# Patient Record
Sex: Male | Born: 1940
Health system: Southern US, Community
[De-identification: ages and names within clinical notes are randomized; demographics above are authoritative.]

## PROBLEM LIST (undated history)

## (undated) DIAGNOSIS — E785 Hyperlipidemia, unspecified: Secondary | ICD-10-CM

## (undated) DIAGNOSIS — I2510 Atherosclerotic heart disease of native coronary artery without angina pectoris: Secondary | ICD-10-CM

## (undated) DIAGNOSIS — I219 Acute myocardial infarction, unspecified: Secondary | ICD-10-CM

## (undated) DIAGNOSIS — M67439 Ganglion, unspecified wrist: Secondary | ICD-10-CM

## (undated) DIAGNOSIS — M459 Ankylosing spondylitis of unspecified sites in spine: Secondary | ICD-10-CM

## (undated) DIAGNOSIS — M7989 Other specified soft tissue disorders: Secondary | ICD-10-CM

## (undated) DIAGNOSIS — M2420 Disorder of ligament, unspecified site: Secondary | ICD-10-CM

## (undated) DIAGNOSIS — Z8613 Personal history of malaria: Secondary | ICD-10-CM

## (undated) DIAGNOSIS — N200 Calculus of kidney: Secondary | ICD-10-CM

## (undated) DIAGNOSIS — M629 Disorder of muscle, unspecified: Secondary | ICD-10-CM

## (undated) DIAGNOSIS — R42 Dizziness and giddiness: Secondary | ICD-10-CM

## (undated) DIAGNOSIS — I10 Essential (primary) hypertension: Secondary | ICD-10-CM

## (undated) DIAGNOSIS — M75100 Unspecified rotator cuff tear or rupture of unspecified shoulder, not specified as traumatic: Secondary | ICD-10-CM

## (undated) DIAGNOSIS — T7840XA Allergy, unspecified, initial encounter: Secondary | ICD-10-CM

## (undated) DIAGNOSIS — F419 Anxiety disorder, unspecified: Secondary | ICD-10-CM

## (undated) DIAGNOSIS — K635 Polyp of colon: Secondary | ICD-10-CM

## (undated) DIAGNOSIS — M1990 Unspecified osteoarthritis, unspecified site: Secondary | ICD-10-CM

## (undated) DIAGNOSIS — R05 Cough: Secondary | ICD-10-CM

## (undated) DIAGNOSIS — R9389 Abnormal findings on diagnostic imaging of other specified body structures: Secondary | ICD-10-CM

## (undated) DIAGNOSIS — R059 Cough, unspecified: Secondary | ICD-10-CM

## (undated) HISTORY — DX: Anxiety disorder, unspecified: F41.9

## (undated) HISTORY — DX: Cough, unspecified: R05.9

## (undated) HISTORY — PX: CORONARY ANGIOPLASTY WITH STENT PLACEMENT: SHX49

## (undated) HISTORY — DX: Allergy, unspecified, initial encounter: T78.40XA

## (undated) HISTORY — DX: Essential (primary) hypertension: I10

## (undated) HISTORY — DX: Other specified soft tissue disorders: M79.89

## (undated) HISTORY — DX: Polyp of colon: K63.5

## (undated) HISTORY — PX: OTHER SURGICAL HISTORY: SHX169

## (undated) HISTORY — DX: Disorder of ligament, unspecified site: M24.20

## (undated) HISTORY — DX: Atherosclerotic heart disease of native coronary artery without angina pectoris: I25.10

## (undated) HISTORY — PX: PROSTATE BIOPSY: SHX241

## (undated) HISTORY — DX: Calculus of kidney: N20.0

## (undated) HISTORY — DX: Ankylosing spondylitis of unspecified sites in spine: M45.9

## (undated) HISTORY — DX: Unspecified osteoarthritis, unspecified site: M19.90

## (undated) HISTORY — DX: Personal history of malaria: Z86.13

## (undated) HISTORY — DX: Abnormal findings on diagnostic imaging of other specified body structures: R93.89

## (undated) HISTORY — DX: Acute myocardial infarction, unspecified: I21.9

## (undated) HISTORY — DX: Unspecified rotator cuff tear or rupture of unspecified shoulder, not specified as traumatic: M75.100

## (undated) HISTORY — DX: Ganglion, unspecified wrist: M67.439

## (undated) HISTORY — DX: Cough: R05

## (undated) HISTORY — PX: ROTATOR CUFF REPAIR: SHX139

## (undated) HISTORY — DX: Dizziness and giddiness: R42

## (undated) HISTORY — DX: Disorder of muscle, unspecified: M62.9

## (undated) HISTORY — DX: Hyperlipidemia, unspecified: E78.5

---

## 1998-03-08 ENCOUNTER — Ambulatory Visit (HOSPITAL_COMMUNITY): Admission: RE | Admit: 1998-03-08 | Discharge: 1998-03-08 | Payer: Self-pay | Admitting: Urology

## 1998-03-15 ENCOUNTER — Emergency Department (HOSPITAL_COMMUNITY): Admission: EM | Admit: 1998-03-15 | Discharge: 1998-03-15 | Payer: Self-pay | Admitting: Emergency Medicine

## 2000-12-12 ENCOUNTER — Encounter: Admission: RE | Admit: 2000-12-12 | Discharge: 2000-12-12 | Payer: Self-pay | Admitting: Orthopedic Surgery

## 2000-12-12 ENCOUNTER — Encounter: Payer: Self-pay | Admitting: Orthopedic Surgery

## 2001-06-26 ENCOUNTER — Encounter: Admission: RE | Admit: 2001-06-26 | Discharge: 2001-06-26 | Payer: Self-pay | Admitting: Internal Medicine

## 2001-06-26 ENCOUNTER — Encounter: Payer: Self-pay | Admitting: Internal Medicine

## 2001-06-29 ENCOUNTER — Ambulatory Visit (HOSPITAL_COMMUNITY): Admission: RE | Admit: 2001-06-29 | Discharge: 2001-06-29 | Payer: Self-pay | Admitting: Internal Medicine

## 2002-09-26 ENCOUNTER — Encounter: Payer: Self-pay | Admitting: Pediatrics

## 2002-09-27 ENCOUNTER — Observation Stay (HOSPITAL_COMMUNITY): Admission: EM | Admit: 2002-09-27 | Discharge: 2002-09-27 | Payer: Self-pay | Admitting: Emergency Medicine

## 2004-03-27 ENCOUNTER — Emergency Department (HOSPITAL_COMMUNITY): Admission: EM | Admit: 2004-03-27 | Discharge: 2004-03-27 | Payer: Self-pay | Admitting: Emergency Medicine

## 2006-08-12 DIAGNOSIS — I219 Acute myocardial infarction, unspecified: Secondary | ICD-10-CM

## 2006-08-12 HISTORY — DX: Acute myocardial infarction, unspecified: I21.9

## 2006-08-19 ENCOUNTER — Ambulatory Visit: Payer: Self-pay | Admitting: Cardiology

## 2006-08-19 ENCOUNTER — Inpatient Hospital Stay (HOSPITAL_COMMUNITY): Admission: AC | Admit: 2006-08-19 | Discharge: 2006-08-25 | Payer: Self-pay

## 2006-08-21 ENCOUNTER — Encounter: Payer: Self-pay | Admitting: Cardiology

## 2006-09-10 ENCOUNTER — Ambulatory Visit: Payer: Self-pay | Admitting: Cardiology

## 2006-09-11 ENCOUNTER — Encounter (HOSPITAL_COMMUNITY): Admission: RE | Admit: 2006-09-11 | Discharge: 2006-12-10 | Payer: Self-pay | Admitting: Cardiology

## 2006-09-12 ENCOUNTER — Ambulatory Visit: Admission: RE | Admit: 2006-09-12 | Discharge: 2006-09-12 | Payer: Self-pay | Admitting: Cardiology

## 2006-09-12 ENCOUNTER — Ambulatory Visit: Payer: Self-pay | Admitting: Cardiology

## 2006-09-12 LAB — CONVERTED CEMR LAB: Pro B Natriuretic peptide (BNP): 53 pg/mL

## 2006-10-14 ENCOUNTER — Ambulatory Visit: Payer: Self-pay | Admitting: Cardiology

## 2006-10-24 ENCOUNTER — Encounter: Payer: Self-pay | Admitting: Cardiology

## 2006-10-24 ENCOUNTER — Ambulatory Visit: Payer: Self-pay | Admitting: Cardiology

## 2006-10-24 ENCOUNTER — Ambulatory Visit: Payer: Self-pay

## 2006-10-24 LAB — CONVERTED CEMR LAB
ALT: 28 units/L (ref 0–40)
BUN: 14 mg/dL (ref 6–23)
Bilirubin, Direct: 0.1 mg/dL (ref 0.0–0.3)
CO2: 32 meq/L (ref 19–32)
Cholesterol: 73 mg/dL (ref 0–200)
Creatinine, Ser: 1.1 mg/dL (ref 0.4–1.5)
GFR calc Af Amer: 87 mL/min
GFR calc non Af Amer: 72 mL/min
Glucose, Bld: 89 mg/dL (ref 70–99)
HDL: 31 mg/dL — ABNORMAL LOW (ref >39.0)
LDL Cholesterol: 30 mg/dL (ref 0–99)
Sodium: 143 meq/L (ref 135–145)

## 2006-12-03 ENCOUNTER — Ambulatory Visit: Payer: Self-pay | Admitting: Cardiovascular Disease

## 2007-01-13 ENCOUNTER — Ambulatory Visit: Payer: Self-pay | Admitting: Cardiology

## 2007-01-29 ENCOUNTER — Ambulatory Visit: Payer: Self-pay | Admitting: Cardiovascular Disease

## 2007-02-09 ENCOUNTER — Ambulatory Visit: Payer: Self-pay | Admitting: Cardiology

## 2007-02-10 ENCOUNTER — Ambulatory Visit: Payer: Self-pay | Admitting: Cardiology

## 2007-02-10 ENCOUNTER — Ambulatory Visit: Payer: Self-pay | Admitting: Cardiovascular Disease

## 2007-02-10 ENCOUNTER — Ambulatory Visit (HOSPITAL_COMMUNITY): Admission: RE | Admit: 2007-02-10 | Discharge: 2007-02-10 | Payer: Self-pay | Admitting: Cardiovascular Disease

## 2007-02-10 LAB — CONVERTED CEMR LAB
Basophils Absolute: 0 10*3/uL (ref 0.0–0.1)
Basophils Relative: 0 % (ref 0.0–1.0)
Hemoglobin: 14.2 g/dL (ref 13.0–17.0)
Lymphocytes Relative: 30.7 % (ref 12.0–46.0)
MCV: 94.1 fL (ref 78.0–100.0)
Monocytes Absolute: 0.3 10*3/uL (ref 0.2–0.7)
Monocytes Relative: 6.3 % (ref 3.0–11.0)
Neutrophils Relative %: 61.7 % (ref 43.0–77.0)
Platelets: 125 10*3/uL — ABNORMAL LOW (ref 150–400)
WBC: 4.2 10*3/uL — ABNORMAL LOW (ref 4.5–10.5)

## 2007-02-16 ENCOUNTER — Ambulatory Visit: Payer: Self-pay

## 2007-02-16 LAB — CONVERTED CEMR LAB
Chloride: 110 meq/L (ref 96–112)
Cholesterol: 88 mg/dL (ref 0–200)
HDL: 30.4 mg/dL — ABNORMAL LOW (ref >39.0)
Potassium: 3.8 meq/L (ref 3.5–5.1)
Total CHOL/HDL Ratio: 2.9
Triglycerides: 59 mg/dL (ref 0–149)

## 2007-02-19 ENCOUNTER — Ambulatory Visit: Payer: Self-pay | Admitting: Pulmonary Disease

## 2007-03-11 ENCOUNTER — Ambulatory Visit: Payer: Self-pay | Admitting: Cardiology

## 2007-03-11 ENCOUNTER — Ambulatory Visit: Payer: Self-pay | Admitting: Cardiovascular Disease

## 2007-04-09 ENCOUNTER — Ambulatory Visit: Payer: Self-pay | Admitting: Cardiology

## 2007-05-12 ENCOUNTER — Ambulatory Visit: Payer: Self-pay | Admitting: Cardiology

## 2007-05-12 LAB — CONVERTED CEMR LAB
Alkaline Phosphatase: 51 units/L (ref 39–117)
Cholesterol: 101 mg/dL (ref 0–200)
HDL: 31.8 mg/dL — ABNORMAL LOW (ref >39.0)
LDL Cholesterol: 49 mg/dL (ref 0–99)
Total Bilirubin: 1.1 mg/dL (ref 0.3–1.2)
Total Protein: 6 g/dL (ref 6.0–8.3)

## 2007-05-14 ENCOUNTER — Ambulatory Visit: Payer: Self-pay | Admitting: Cardiology

## 2007-08-27 ENCOUNTER — Ambulatory Visit: Payer: Self-pay | Admitting: Cardiology

## 2007-08-27 LAB — CONVERTED CEMR LAB
ALT: 25 units/L (ref 0–53)
AST: 18 units/L (ref 0–37)
Albumin: 3.9 g/dL (ref 3.5–5.2)
Alkaline Phosphatase: 55 units/L (ref 39–117)
Cholesterol: 93 mg/dL (ref 0–200)
LDL Cholesterol: 54 mg/dL (ref 0–99)
VLDL: 10 mg/dL (ref 0–40)

## 2007-09-03 ENCOUNTER — Ambulatory Visit: Payer: Self-pay | Admitting: Cardiology

## 2007-09-15 ENCOUNTER — Ambulatory Visit: Payer: Self-pay | Admitting: Cardiology

## 2007-11-03 ENCOUNTER — Encounter: Payer: Self-pay | Admitting: Pulmonary Disease

## 2007-11-24 ENCOUNTER — Ambulatory Visit: Payer: Self-pay | Admitting: Cardiology

## 2007-11-24 LAB — CONVERTED CEMR LAB
ALT: 35 units/L (ref 0–53)
AST: 26 units/L (ref 0–37)
Albumin: 4 g/dL (ref 3.5–5.2)
Alkaline Phosphatase: 61 units/L (ref 39–117)
Cholesterol: 100 mg/dL (ref 0–200)
HDL: 29.1 mg/dL — ABNORMAL LOW (ref >39.0)
LDL Cholesterol: 44 mg/dL (ref 0–99)
Total Bilirubin: 0.8 mg/dL (ref 0.3–1.2)
Total Protein: 6.8 g/dL (ref 6.0–8.3)
VLDL: 27 mg/dL (ref 0–40)

## 2007-11-26 ENCOUNTER — Ambulatory Visit: Payer: Self-pay | Admitting: Cardiology

## 2008-02-16 ENCOUNTER — Ambulatory Visit: Payer: Self-pay | Admitting: Cardiology

## 2008-02-16 LAB — CONVERTED CEMR LAB
AST: 21 units/L (ref 0–37)
Albumin: 4 g/dL (ref 3.5–5.2)
Cholesterol: 96 mg/dL (ref 0–200)
HDL: 32.6 mg/dL — ABNORMAL LOW (ref >39.0)
LDL Cholesterol: 45 mg/dL (ref 0–99)
Total Protein: 6.8 g/dL (ref 6.0–8.3)
Triglycerides: 91 mg/dL (ref 0–149)

## 2008-02-18 ENCOUNTER — Ambulatory Visit: Payer: Self-pay | Admitting: Cardiology

## 2008-03-15 ENCOUNTER — Ambulatory Visit: Payer: Self-pay | Admitting: Cardiology

## 2008-03-23 ENCOUNTER — Ambulatory Visit: Payer: Self-pay

## 2008-03-23 ENCOUNTER — Encounter: Payer: Self-pay | Admitting: Pulmonary Disease

## 2008-03-23 ENCOUNTER — Ambulatory Visit: Payer: Self-pay | Admitting: Cardiology

## 2008-03-23 LAB — CONVERTED CEMR LAB
Basophils Absolute: 0 10*3/uL (ref 0.0–0.1)
Creatinine, Ser: 1.1 mg/dL (ref 0.4–1.5)
GFR calc non Af Amer: 72 mL/min
Hemoglobin: 13.9 g/dL (ref 13.0–17.0)
MCV: 96.3 fL (ref 78.0–100.0)
Neutrophils Relative %: 62 % (ref 43.0–77.0)
Potassium: 4.1 meq/L (ref 3.5–5.1)
RBC: 4.1 M/uL — ABNORMAL LOW (ref 4.22–5.81)
Sodium: 139 meq/L (ref 135–145)

## 2008-06-01 ENCOUNTER — Ambulatory Visit: Payer: Self-pay | Admitting: Cardiology

## 2008-06-01 LAB — CONVERTED CEMR LAB
ALT: 26 units/L (ref 0–53)
AST: 21 units/L (ref 0–37)
Cholesterol: 97 mg/dL (ref 0–200)
HDL: 33.7 mg/dL — ABNORMAL LOW (ref >39.0)
Total Bilirubin: 0.9 mg/dL (ref 0.3–1.2)
Total CHOL/HDL Ratio: 2.9

## 2008-06-15 ENCOUNTER — Encounter: Payer: Self-pay | Admitting: Pulmonary Disease

## 2008-07-18 ENCOUNTER — Ambulatory Visit: Payer: Self-pay | Admitting: Cardiology

## 2008-07-18 LAB — CONVERTED CEMR LAB
ALT: 22 units/L (ref 0–53)
AST: 19 units/L (ref 0–37)
Albumin: 3.9 g/dL (ref 3.5–5.2)
Alkaline Phosphatase: 51 units/L (ref 39–117)
Cholesterol: 100 mg/dL (ref 0–200)
LDL Cholesterol: 52 mg/dL (ref 0–99)
Total CHOL/HDL Ratio: 2.6
Total Protein: 6.6 g/dL (ref 6.0–8.3)
VLDL: 9 mg/dL (ref 0–40)

## 2008-07-21 ENCOUNTER — Ambulatory Visit: Payer: Self-pay | Admitting: Cardiology

## 2009-01-02 ENCOUNTER — Ambulatory Visit: Payer: Self-pay | Admitting: Cardiology

## 2009-01-03 LAB — CONVERTED CEMR LAB
Albumin: 4 g/dL (ref 3.5–5.2)
Bilirubin, Direct: 0.1 mg/dL (ref 0.0–0.3)
Total CHOL/HDL Ratio: 3
Total Protein: 6.5 g/dL (ref 6.0–8.3)
Triglycerides: 63 mg/dL (ref 0.0–149.0)
VLDL: 12.6 mg/dL (ref 0.0–40.0)

## 2009-01-05 ENCOUNTER — Ambulatory Visit: Payer: Self-pay | Admitting: Internal Medicine

## 2009-01-21 LAB — HM COLONOSCOPY

## 2009-04-05 DIAGNOSIS — I2510 Atherosclerotic heart disease of native coronary artery without angina pectoris: Secondary | ICD-10-CM

## 2009-04-06 ENCOUNTER — Ambulatory Visit: Payer: Self-pay | Admitting: Cardiology

## 2009-05-12 HISTORY — PX: HEMORRHOID SURGERY: SHX153

## 2009-05-29 ENCOUNTER — Telehealth (INDEPENDENT_AMBULATORY_CARE_PROVIDER_SITE_OTHER): Payer: Self-pay | Admitting: *Deleted

## 2009-06-05 ENCOUNTER — Ambulatory Visit: Payer: Self-pay | Admitting: Cardiology

## 2009-06-06 LAB — CONVERTED CEMR LAB
ALT: 26 units/L (ref 0–53)
AST: 22 units/L (ref 0–37)
LDL Cholesterol: 49 mg/dL (ref 0–99)
Total CHOL/HDL Ratio: 3
Total Protein: 6.5 g/dL (ref 6.0–8.3)
Triglycerides: 44 mg/dL (ref 0.0–149.0)
VLDL: 8.8 mg/dL (ref 0.0–40.0)

## 2009-06-08 ENCOUNTER — Ambulatory Visit: Payer: Self-pay | Admitting: Cardiovascular Disease

## 2009-06-15 ENCOUNTER — Encounter: Payer: Self-pay | Admitting: Cardiovascular Disease

## 2009-06-16 ENCOUNTER — Telehealth (INDEPENDENT_AMBULATORY_CARE_PROVIDER_SITE_OTHER): Payer: Self-pay | Admitting: *Deleted

## 2009-10-04 ENCOUNTER — Ambulatory Visit: Payer: Self-pay | Admitting: Internal Medicine

## 2009-10-04 ENCOUNTER — Inpatient Hospital Stay (HOSPITAL_COMMUNITY): Admission: EM | Admit: 2009-10-04 | Discharge: 2009-10-05 | Payer: Self-pay | Admitting: Emergency Medicine

## 2009-10-18 ENCOUNTER — Encounter: Admission: RE | Admit: 2009-10-18 | Discharge: 2010-01-09 | Payer: Self-pay | Admitting: Family Medicine

## 2009-10-31 ENCOUNTER — Encounter: Payer: Self-pay | Admitting: Internal Medicine

## 2009-11-03 ENCOUNTER — Ambulatory Visit: Payer: Self-pay | Admitting: Internal Medicine

## 2009-11-03 ENCOUNTER — Telehealth (INDEPENDENT_AMBULATORY_CARE_PROVIDER_SITE_OTHER): Payer: Self-pay | Admitting: *Deleted

## 2009-11-03 DIAGNOSIS — R05 Cough: Secondary | ICD-10-CM

## 2009-11-21 ENCOUNTER — Ambulatory Visit: Payer: Self-pay | Admitting: Cardiology

## 2009-11-23 ENCOUNTER — Ambulatory Visit: Payer: Self-pay | Admitting: Cardiology

## 2009-11-24 LAB — CONVERTED CEMR LAB
AST: 24 units/L (ref 0–37)
LDL Cholesterol: 25 mg/dL (ref 0–99)
VLDL: 8.8 mg/dL (ref 0.0–40.0)

## 2009-11-30 ENCOUNTER — Telehealth (INDEPENDENT_AMBULATORY_CARE_PROVIDER_SITE_OTHER): Payer: Self-pay | Admitting: *Deleted

## 2009-12-05 ENCOUNTER — Ambulatory Visit: Payer: Self-pay | Admitting: Pulmonary Disease

## 2009-12-05 DIAGNOSIS — M6740 Ganglion, unspecified site: Secondary | ICD-10-CM

## 2009-12-05 DIAGNOSIS — R930 Abnormal findings on diagnostic imaging of skull and head, not elsewhere classified: Secondary | ICD-10-CM

## 2009-12-05 DIAGNOSIS — B54 Unspecified malaria: Secondary | ICD-10-CM | POA: Insufficient documentation

## 2009-12-05 DIAGNOSIS — E7800 Pure hypercholesterolemia, unspecified: Secondary | ICD-10-CM | POA: Insufficient documentation

## 2009-12-05 DIAGNOSIS — M459 Ankylosing spondylitis of unspecified sites in spine: Secondary | ICD-10-CM | POA: Insufficient documentation

## 2009-12-05 DIAGNOSIS — M629 Disorder of muscle, unspecified: Secondary | ICD-10-CM

## 2009-12-05 DIAGNOSIS — K649 Unspecified hemorrhoids: Secondary | ICD-10-CM | POA: Insufficient documentation

## 2009-12-05 DIAGNOSIS — M75120 Complete rotator cuff tear or rupture of unspecified shoulder, not specified as traumatic: Secondary | ICD-10-CM

## 2009-12-05 DIAGNOSIS — D126 Benign neoplasm of colon, unspecified: Secondary | ICD-10-CM

## 2009-12-05 DIAGNOSIS — F411 Generalized anxiety disorder: Secondary | ICD-10-CM | POA: Insufficient documentation

## 2009-12-05 DIAGNOSIS — J309 Allergic rhinitis, unspecified: Secondary | ICD-10-CM

## 2010-01-19 ENCOUNTER — Telehealth: Payer: Self-pay | Admitting: Cardiology

## 2010-03-27 ENCOUNTER — Ambulatory Visit: Payer: Self-pay | Admitting: Cardiology

## 2010-04-20 ENCOUNTER — Encounter: Payer: Self-pay | Admitting: Pulmonary Disease

## 2010-05-07 ENCOUNTER — Ambulatory Visit: Payer: Self-pay | Admitting: Cardiology

## 2010-05-08 LAB — CONVERTED CEMR LAB
ALT: 22 units/L (ref 0–53)
Alkaline Phosphatase: 56 units/L (ref 39–117)
Cholesterol: 103 mg/dL (ref 0–200)
HDL: 35.4 mg/dL — ABNORMAL LOW (ref >39.00)
LDL Cholesterol: 56 mg/dL (ref 0–99)
Triglycerides: 60 mg/dL (ref 0.0–149.0)

## 2010-05-10 ENCOUNTER — Ambulatory Visit: Payer: Self-pay | Admitting: Cardiology

## 2010-06-04 ENCOUNTER — Ambulatory Visit: Payer: Self-pay | Admitting: Pulmonary Disease

## 2010-06-06 LAB — CONVERTED CEMR LAB
Basophils Absolute: 0 10*3/uL (ref 0.0–0.1)
Calcium: 9.3 mg/dL (ref 8.4–10.5)
Chloride: 105 meq/L (ref 96–112)
Glucose, Bld: 125 mg/dL — ABNORMAL HIGH (ref 70–99)
HCT: 37.6 % — ABNORMAL LOW (ref 39.0–52.0)
Hemoglobin: 13.2 g/dL (ref 13.0–17.0)
Lymphocytes Relative: 38.4 % (ref 12.0–46.0)
Lymphs Abs: 1.3 10*3/uL (ref 0.7–4.0)
MCV: 95.2 fL (ref 78.0–100.0)
PSA: 2.64 ng/mL (ref 0.10–4.00)
RBC: 3.95 M/uL — ABNORMAL LOW (ref 4.22–5.81)
Sodium: 139 meq/L (ref 135–145)
WBC: 3.3 10*3/uL — ABNORMAL LOW (ref 4.5–10.5)

## 2010-07-19 ENCOUNTER — Telehealth: Payer: Self-pay | Admitting: Pulmonary Disease

## 2010-08-22 ENCOUNTER — Telehealth (INDEPENDENT_AMBULATORY_CARE_PROVIDER_SITE_OTHER): Payer: Self-pay | Admitting: *Deleted

## 2010-09-02 ENCOUNTER — Encounter: Payer: Self-pay | Admitting: Cardiovascular Disease

## 2010-09-11 NOTE — Assessment & Plan Note (Signed)
Summary: rov lipid - [facility]  Medications Added NIASPAN 500 MG CR-TABS (NIACIN (ANTIHYPERLIPIDEMIC)) 1 TAB once daily        Lipid Management History:      Positive NCEP/ATP III risk factors include male age 68 years old or older, HDL cholesterol less than 40, and ASHD (either angina/prior MI/prior CABG).  Negative NCEP/ATP III risk factors include non-tobacco-user status.    The patient comes in today for dyslipidemia follow-up.  The patient has no complaints of medication problems, chest pain, palpitations, shortness of breath, peripheral edema, muscle aches, and muscle cramps.  Dietary compliance review reveals eating 5 or more fruits and vegetables, not counting carbohydrates but is limiting them, and limiting fats and TFA's.  He is avoiding red meats as much as possible, and eats a lot of chicken and salmon.  Review of exercise habits reveals that the patient is occasionally walking at the [facility] for 30 minutes, but has not been able to get there in the past week because he is very busy.  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  Compliance with medication is good.    Lipid Management Provider  Leota Sauers, PharmD, BCPS, CPP  Current Medications (verified): 1)  Aspirin 81 Mg Tbec (Aspirin) .... Take One Tablet By Mouth Daily 2)  Carvedilol 3.125 Mg Tabs (Carvedilol) .... Take One Tablet By Mouth Twice A Day 3)  Simvastatin 40 Mg Tabs (Simvastatin) .... Take 1/2  Tablet By Mouth Daily At Bedtime 4)  Niaspan 500 Mg Cr-Tabs (Niacin (Antihyperlipidemic)) .[name].. 1 Tab Once Daily  Allergies: No Known Drug Allergies  Social History: never smoker.  wife smokes but has just recently quit.   Vital Signs:  Patient profile:   70 year old male Weight:      184 pounds Pulse rate:   44 / minute BP sitting:   134 / 82  (right arm) Cuff size:   regular  Impression & Recommendations:  Problem # 1:  HYPERCHOLESTEROLEMIA (ICD-272.0) Assessment Unchanged Long-standing  history of hypercholesterolemia currently well controlled below LDL goal of 130 and TC < 200.  Since last visit, his cholesterol has elevated slightly due to reducing simvastatin dose to 20 mg daily, but all numbers are still at or better than current goals.  Continue to eat healthy foods including vegetables and control portion sizes.  Work to improve exercising regularly by going back to the [facility] to walk for 30 minutes three times a week.  Reduce Niaspan dose to 500 mg once daily.  If HDL decreases significantly, will increase back to twice daily.  Return in six months for labs and office visit.  Seen with: Kennieth Francois, PharmD Candidate  His updated medication list for this problem includes:    Simvastatin 40 Mg Tabs (Simvastatin) .[name]... Take 1/2  tablet by mouth daily at bedtime    Niaspan 500 Mg Cr-tabs (Niacin (antihyperlipidemic)) .[name]... 1 tab once daily  Problem # 2:  CAD, NATIVE VESSEL (ICD-414.01)  Lipid Assessment/Plan:      Based on NCEP/ATP III, the patient's risk factor category is "history of coronary disease, peripheral vascular disease, cerebrovascular disease, or aortic aneurysm".  The patient's lipid goals are as follows: Total cholesterol goal is 200; LDL cholesterol goal is 130; HDL cholesterol goal is 40; Triglyceride goal is 150.     Patient Instructions: 1)  It was nice to meet you! 2)  Continue taking all of your medications every day: 3)  Simvastatin 40 mg (one-half tablet daily) 4)  Niaspan 500 mg ONCE daily 5)  Aspirin 81 mg daily 6)  Carvedilol 3.125 twice daily 7)  You are doing great with your diet and eating healthy.  Continue to eat lots of vegetables. 8)  You are doing great staying active throughout the week.  Try to get back into walking at the [facility].   9)  Return for another visit in 6 months.

## 2010-09-11 NOTE — Assessment & Plan Note (Signed)
Summary: lipid rov/eac   Primary Provider:  dr Kriste Basque  CC:  dyslipidemia follow-up.  History of Present Illness:  Lipid Clinic Visit      The patient comes in today for dyslipidemia follow-up.  The patient has no complaints of medication problems, chest pain, palpitations, shortness of breath, peripheral edema, muscle aches, and muscle cramps.  Dietary compliance review reveals an overall grade of eating 5 or more fruits and vegetables, not counting carbohydratesbut is limiting them, and limiting fats and TFA's.  Review of exercise habits reveals that the patient is walking  twice a week.  Compliance with medication is good.    Preventive Screening-Counseling & Management  Alcohol-Tobacco     Alcohol drinks/day: <1     Smoking Status: never  Caffeine-Diet-Exercise     Caffeine use/day: 2-3 cups /day     Does Patient Exercise: yes  Current Medications (verified): 1)  Simvastatin 40 Mg Tabs (Simvastatin) .... Take One Tablet By Mouth Daily At Bedtime 2)  Niaspan 500 Mg Cr-Tabs (Niacin (Antihyperlipidemic)) .[name].. 1 Tab Bid 3)  Carvedilol 3.125 Mg Tabs (Carvedilol) .... Take One Tablet By Mouth Twice A Day 4)  Aspirin 81 Mg Tbec (Aspirin) .... Take One Tablet By Mouth Daily 5)  Benicar 20 Mg  Tabs (Olmesartan Medoxomil) .... One Half Daily 6)  Tramadol Hcl 50 Mg  Tabs (Tramadol Hcl) .... One To Two By Mouth Every 4-6 Hours As Needed  Allergies (verified): No Known Drug Allergies  Social History: Alcohol drinks/day:  <1 Caffeine use/day:  2-3 cups /day Does Patient Exercise:  yes   Vital Signs:  Patient profile:   70 year old male Height:      68 inches Weight:      183 pounds Pulse rate:   60 / minute Pulse rhythm:   regular BP sitting:   110 / 68  (right arm)  Impression & Recommendations:  Problem # 1:  PURE HYPERCHOLESTEROLEMIA (ICD-272.0)  His updated medication list for this problem includes:    Simvastatin 40 Mg Tabs (Simvastatin) .[name]... Take one tablet by mouth  daily at bedtime    Niaspan 500 Mg Cr-tabs (Niacin (antihyperlipidemic)) .[name]... 1 tab bid  Mr Gorniak returns to lipid clinic with no complaints.  He has been more strict on his diet these last few months.  He eats high fiber cereal, lean chicken and fish, and lots fruits and vegies.  He also eats litte red meat and little soda and has increased his water.  I have commended him on a job well done.  He also has been exercising 2 x week  - [place] or 10 min each on elipitacal and bike.  I have encourgaed him to double the exercise and he says he will try.   He has lost about 10lbs since october.    He did say he had a Gi virus last month where he was hospitalzed due to ongoing vommiting and dehydration.  It did take him a a while to have his stregnth and appetite back.    TC 61  < goal < 200   TG 44 < goal < 150  LDL 25 < goal < 100  HDL 28 < goal > 40  - reflective of illness???   LFT ok A low LDL will change simvastatin 20mg . f/u 6 months  Appended Document: lipid rov/eac  Reviewed Juanito Doom, MD

## 2010-09-11 NOTE — Progress Notes (Signed)
Summary: pnuemonia---AWAITING CHART  Phone Note Call from Patient Call back at [phone]   Caller: Patient Call For: nadel Summary of Call: pt have pnuemonia but has not been seen in several years. would like to be seen today. Initial call taken by: Rickard Patience,  November 03, 2009 8:16 AM  Follow-up for Phone Call        The patient was seen at [facility] and told that he had a mild case of pneumonia and given a Zpak. Patient is still coughing with  discolored mucus and requesting an appt. Pt was last seen by SN on 02/19/2007. I have ordered his paper chart so that SN can review.Michel Bickers CMA  November 03, 2009 9:59 AM  Chart and msg placed on SN cart for review.Michel Bickers [facility]  November 03, 2009 11:46 AM  Additional Follow-up for Phone Call Additional follow up Details #1::        Per SN, pt can come in this afternoon to be evaluated by Dr. Sherene Sires and then make f/u with SN. Pt must keep f/u appt with SN in order to make any future appts. Pt's chart given to [facility] for MW to have for appt. Additional Follow-up by: Michel Bickers CMA,  November 03, 2009 12:33 PM

## 2010-09-11 NOTE — Assessment & Plan Note (Signed)
Summary: 6 months/apc   Primary Care Provider:  dr Kriste Basque  CC:  6 month ROV & review of mult medical problems....  History of Present Illness: 70 y/o WF here for an ROV... he has mult medical problems & is followed regularly by DrBBrodie for Cards w/ hx of CAD, s/p AWMI w/ VFib arrest, subseq PTCA/ stent 1/08... he also has Hypercholesterolemia & followed in the LipidClinic on Simvastatin & Niaspan...   ~  December 05, 2009:  he was hosp 2/11 w/ viral gastroenteritis & improved w/ fluids... 3/11 he noted cough, "tickle in throat", clear sputum, & cough worse at night... the gastroent was assoc w/ N&V as well & he felt that this "burned my esoph"> Rx OTC PPI vs H2 blockers & antireflux regimen... he was also on an ACE inhibitor- Enalapril 2.5mg  Bid & this was stopped, w/ Benicar 20mg - 1/2 tab daily substit...  He reports that cough has resolved off the Enalapril w/ the aide of antireflux Rx & he feels that he is back to baseline... he feels sl weak, low energy & we note low BP today 100/54> therefore rec to stop the Benicar & follow BP at home...  NOTE: I last saw pt 7/08 before our EMR> see prob list below:   ~  June 04, 2010:  6 month ROV doing well- no new complaints or concerns... he saw DrBrodie 8/11- stable & no changes made... also followed in the Lipid Clinic w/ labs done 9/11 & improved- Niaspan decr to just one daily... Pulm is stable w/o cough etc;  GI is followed in [place] & up to date;  Ortho probles addressed by DrMurphy & Deveshwar- on Celebrex & OTC anti-inflamm meds Prn... he had the Pneumovax last yr age 31, & already had the 2011 Flu shot.   Current Problems:   ALLERGIC RHINITIS (ICD-477.9) - hx AR w/ +allergy testing to ragweed & dust in the past.  COUGH (ICD-786.2) - eval 3/11 for dry irritative cough, on Enalapril, & cough resolved off ACE Rx.  Hx of ABNORMAL CHEST XRAY (ICD-793.1) - hx mild basilar pulm fibrosis on old CXR/ CT scan in 2002... there is also mild nonprogressive  elevation of the right hemidiaphragm on old films...  CAD, NATIVE VESSEL (ICD-414.01) - he had an AWMI 1/08 w/ VFib arrest, successful resus, urgent cath w/ PTCA/ stent to LAD by DrBBrodie... on ASA 81mg /d, & CARVEDILOL 3.125mg Bid... prev Enalapril stopped due to cough & switched to Benicar> pt asked to stop Benicar due to BP= 100/54, weak/ no energy etc...  denies CP, palpit, syncope, dyspnea, swelling, etc...  ~  2DEcho 3/08 showed mild septal HK, w/ LVF at lower limit of norm.  ~  Myoview 8/09 showed ? sm inferior MI vs diaph attenuation, no ischemia, EF= 56%.  HYPERCHOLESTEROLEMIA (ICD-272.0) - followed in the Lipid Clinic on SIMVASTATIN 40mg - 1/2 tab daily & NIASPAN 500mg - 2/d...  ~  FLP 10/10 on Simva40+Niasp1000 showed TChol 94, TG 44, HDL 36, LDL 49... keep same Rx.  ~  FLP 4/11 on Simva40+Niasp1000 showed TChol 61, TG 44, LDL 28, LDL 25... pt rec to decr to Simva20.  ~  FLP 9/11 on Simva20+Niasp1000 showed TChol 103, TG 60, HDL 35, LDL 56... LipClinic decr the Niaspan to 1/d.  COLONIC POLYPS (ICD-211.3)  ~  colonoscopy 11/02 by Dorris Singh showed divertics, hems, otherw neg.  ~  colonoscopy in [place] DrPeters 10/07 w/ 1 hyperplastic polyp removed> f/u planned 3yrs.  HEMORRHOIDS (ICD-455.6) - he reports hemorrhoid surg 10/10 in  [place].[name].  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  Hx of OTHER DISORDER OF MUSCLE LIGAMENT AND FASCIA (ICD-728.89) - he had a spont hemorrhage into his right thigh 1996  ANXIETY (ICD-300.00) - his son was killed in 1998  Hx of MALARIA (ICD-084.6) - he has malaria while in the service  HEALTH MAINTENANCE:  ~  GI:  followed by DrPeters in [place] & up to date  on colonoscopies...  ~  GU:  exam is neg and PSA= 2.64  ~  Immuniz:  he gets the Flu vaccine each Fall... had PNEUMOVAX in 2010 at age 26.   Allergies: No Known Drug Allergies  Comments:  Nurse/Medical Assistant: The patient's medications and allergies were reviewed with the patient and were updated in the Medication and Allergy Lists. Boone Master CNA/MA  June 04, 2010 9:00 AM   Past History:  Past Medical History: 1. Coronary artery disease status post anterior wall myocardial     infarction in January 2008, complicated by out of hospital cardiac     arrest treated with a bare metal stent to left anterior descending. 2. Ejection fraction 40% improved to 53% by last Myoview. 3. Hyperlipidemia with low HDL. 4. Ankylosing spondylitis. 5. Exertional fatigue and shortness of breath.  6. Hx of Hypertension - ACE stopped 3/11 due to cough.  ALLERGIC RHINITIS (ICD-477.9) COUGH (ICD-786.2) Hx of ABNORMAL CHEST XRAY (ICD-793.1) CAD, NATIVE VESSEL (ICD-414.01) HYPERCHOLESTEROLEMIA (ICD-272.0) COLONIC POLYPS (ICD-211.3) HEMORRHOIDS (ICD-455.6) ANKYLOSING SPONDYLITIS (ICD-720.0) Hx of ROTATOR CUFF TEAR (ICD-727.61) GANGLION CYST, WRIST, LEFT (ICD-727.41) Hx of OTHER DISORDER OF MUSCLE LIGAMENT AND FASCIA (ICD-728.89) ANXIETY (ICD-300.00) Hx of MALARIA (ICD-084.6)  Past Surgical History: S/P bilat rotator cuff repairs by DrCaffrey in the 1990s S/P hemorrhoid surg in [place] 10/10  Family History: Reviewed history and no changes required. emphysema/COPD - mother, father rheumatism - mother cancer - mother (melanomas) DM - mother  Social History: Reviewed history from 05/10/2010 and no changes required. never smoker.  wife smokes but has just recently quit. 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  Review of Systems  The patient denies fever, chills, sweats, anorexia, fatigue, weakness, malaise, weight loss, sleep disorder, blurring,  diplopia, eye irritation, eye discharge, vision loss, eye pain, photophobia, earache, ear discharge, tinnitus, decreased hearing, nasal congestion, nosebleeds, sore throat, hoarseness, chest pain, palpitations, syncope, dyspnea on exertion, orthopnea, PND, peripheral edema, cough, dyspnea at rest, excessive sputum, hemoptysis, wheezing, pleurisy, nausea, vomiting, diarrhea, constipation, change in bowel habits, abdominal pain, melena, hematochezia, jaundice, gas/bloating, indigestion/heartburn, dysphagia, odynophagia, dysuria, hematuria, urinary frequency, urinary hesitancy, nocturia, incontinence, back pain, joint pain, joint swelling, muscle cramps, muscle weakness, stiffness, arthritis, sciatica, restless legs, leg pain at night, leg pain with exertion, rash, itching, dryness, suspicious lesions, paralysis, paresthesias, seizures, tremors, vertigo, transient blindness, frequent falls, frequent headaches, difficulty walking, depression, anxiety, memory loss, confusion, cold intolerance, heat intolerance, polydipsia, polyphagia, polyuria, unusual weight change, abnormal bruising, bleeding, enlarged lymph nodes, urticaria, allergic rash, hay fever, and recurrent infections.    Vital Signs:  Patient profile:   69 year old male Height:      68 inches Weight:      191 pounds BMI:     29.15 O2 Sat:  94 % on Room air Temp:     98.8 degrees F oral Pulse rate:   67 / minute BP sitting:   124 / 76  (left arm) Cuff size:   regular  Vitals Entered By: Boone Master CNA/MA (June 04, 2010 9:00 AM)  O2 Flow:  Room air  Physical Exam  Additional Exam:  WD, WN, 70 y/o WM in NAD... GENERAL:  Alert & oriented; pleasant & cooperative... HEENT:  [place]/AT, EOM-wnl, PERRLA, EACs-clear, TMs-wnl, NOSE-clear, THROAT-clear & wnl. NECK:  Supple w/ fairROM; no JVD; normal carotid impulses w/o bruits; no thyromegaly or nodules palpated; no lymphadenopathy. CHEST:  Clear to P & A; without wheezes/ rales/ or  rhonchi. HEART:  Regular Rhythm; without murmurs/ rubs/ or gallops. ABDOMEN:  Soft & nontender; normal bowel sounds; no organomegaly or masses detected. RECTAL:  prostate 3+ smooth w/o nodules, rectal neg- stool heme neg. EXT: without deformities, mild arthritic changes; no varicose veins/ venous insuffic/ or edema. NEURO:  CN's intact; motor testing normal; sensory testing normal; gait normal & balance OK. DERM:  No lesions noted; no rash etc...    MISC. Report  Procedure date:  06/04/2010  Findings:      BMP (METABOL)   Sodium                    139 mEq/L                   135-145   Potassium                 4.4 mEq/L                   3.5-5.1   Chloride                  105 mEq/L                   96-112   Carbon Dioxide            28 mEq/L                    19-32   Glucose              [H]  125 mg/dL                   16-10   BUN                       18 mg/dL                    9-60   Creatinine                0.9 mg/dL                   4.5-4.0   Calcium                   9.3 mg/dL                   9.8-11.9   GFR                       93.03 mL/min                >60  CBC Platelet w/Diff (CBCD)   White Cell Count     [L]  3.3 K/uL  4.5-10.5   Red Cell Count       [L]  3.95 Mil/uL                 4.22-5.81   Hemoglobin                13.2 g/dL                   57.8-46.9   Hematocrit           [L]  37.6 %                      39.0-52.0   MCV                       95.2 fl                     78.0-100.0   Platelet Count       [L]  118.0 K/uL                  150.0-400.0   Neutrophil %              48.5 %                      43.0-77.0   Lymphocyte %              38.4 %                      12.0-46.0   Monocyte %           [H]  12.1 %                      3.0-12.0   Eosinophils%              0.9 %                       0.0-5.0   Basophils %               0.1 %                       0.0-3.0  TSH (TSH)   FastTSH                   1.96 uIU/mL                  0.35-5.50   Prostate Specific Antigen (PSA)     PSA-Hyb                   2.64 ng/mL                  0.10-4.00   MISC. Report  Procedure date:  05/07/2010  Findings:      Hepatic/Liver Function Panel (HEPATIC)   Total Bilirubin           1.1 mg/dL                   6.2-9.5   Direct Bilirubin          0.3 mg/dL                   2.8-4.1   Alkaline Phosphatase      56 U/L  39-117   AST                       23 U/L                      0-37   ALT                       22 U/L                      0-53   Total Protein             6.8 g/dL                    1.6-1.0   Albumin                   4.3 g/dL                    9.6-0.4  Lipid Panel (LIPID)   Cholesterol               103 mg/dL                   5-409   Triglycerides             60.0 mg/dL                  8.1-191.4   HDL                  [L]  78.29 mg/dL                 >56.21   LDL Cholesterol           56 mg/dL                    3-08   Impression & Recommendations:  Problem # 1:  CAD, NATIVE VESSEL (ICD-414.01) Followed by DrBB>  stable, same Rx, get on diet & get wt down... His updated medication list for this problem includes:    Aspirin 81 Mg Tbec (Aspirin) .[name]... Take one tablet by mouth daily    Carvedilol 3.125 Mg Tabs (Carvedilol) .[name]... Take one tablet by mouth twice a day  Orders: TLB-BMP (Basic Metabolic Panel-BMET) (80048-METABOL) TLB-CBC Platelet - w/Differential (85025-CBCD) TLB-TSH (Thyroid Stimulating Hormone) (84443-TSH) TLB-PSA (Prostate Specific Antigen) (84153-PSA)  Problem # 2:  HYPERCHOLESTEROLEMIA (ICD-272.0) Followed in [facility]... Niaspan recently decr to 500mg /d. His updated medication list for this problem includes:    Simvastatin 40 Mg Tabs (Simvastatin) .[name]... Take 1/2  tablet by mouth daily at bedtime    Niaspan 500 Mg Cr-tabs (Niacin (antihyperlipidemic)) .[name]... 1 tab once daily  Problem # 3:  BORDERLINE FBS>>> FBS at 125... needs better low carb diet, incr exercise,  continue to monitor BS...  Problem # 4:  COLONIC POLYPS (ICD-211.3) Followed by GI in [place]...  Problem # 5:  ANKYLOSING SPONDYLITIS (ICD-720.0) Rheum per DrDeveshwar & stable on NSAIDs Prn...  Problem # 6:  OTHER MEDICAL PROBLEMS AS NOTED>>>  Complete Medication List: 1)  Aspirin 81 Mg Tbec (Aspirin) .... Take one tablet by mouth daily 2)  Carvedilol 3.125 Mg Tabs (Carvedilol) .... Take one tablet by mouth twice a day 3)  Simvastatin 40 Mg Tabs (Simvastatin) .... Take 1/2  tablet by mouth daily at bedtime 4)  Niaspan 500 Mg Cr-tabs (Niacin (antihyperlipidemic)) .Marland KitchenMarland KitchenMarland Kitchen  1 tab once daily  Patient Instructions: 1)  Today we updated your med list- see below.... 2)  Continue your current meds the same for now... 3)  Today we did your follow up blood work... please call the "phone tree" in a few days for your lab results.Marland KitchenMarland Kitchen 4)  Let's work on weight reduction over the next 6 months... 5)  Call for any problems.Marland KitchenMarland Kitchen 6)  Please schedule a follow-up appointment in 6 months.

## 2010-09-11 NOTE — Progress Notes (Signed)
Summary: Benicar RX  Phone Note Call from Patient Call back at Home Phone [phone]   Caller: Patient Call For: wert Reason for Call: Talk to Nurse Summary of Call: never called in pt's Benicar 20mg .  Can you call this in for the pt? [facility] [employer] Initial call taken by: Eugene Gavia,  November 30, 2009 4:41 PM  Follow-up for Phone Call        RX sent and pt is aware.Michel Bickers [facility]  November 30, 2009 4:50 PM    Prescriptions: BENICAR 20 MG  TABS (OLMESARTAN MEDOXOMIL) one half daily  #15 x 6   Entered by:   Michel Bickers CMA   Authorized by:   Nyoka Cowden MD   Signed by:   Michel Bickers CMA on 11/30/2009   Method used:   Electronically to        [id number] (retail)       [address]       [place], [place]  [address]       Ph: [phone] or [phone]       Fax: [phone]   RxID:   [id number]

## 2010-09-11 NOTE — Assessment & Plan Note (Signed)
Summary: Pulmonary/  severe cough after vomit ? asp ? ace    Primary Provider/Referring Provider:  dr Kriste Basque  CC:  Acute visit.  Pt c/o "tickle in chest" x 2 wks and cough x 5 days.  He states that cough is prod with clear sputum.  Cough is worse at bedtime.  He is currently on zpack per [facility].[name]  History of Present Illness: 15 yowm never smoker  Admit 2/22-4/11 with NVD developed cp dx= " burned esophagus"  then cough onset one week after discharge and progressively worse minimally productive in ams of white / clear rx with zpak and hydromet since 3/21.  Has sense something stuck in throat, more hoarse than usual,  Pt denies presently  any significant sore throat, dysphagia, itching, sneezing,  nasal congestion or excess secretions,  fever, chills, sweats, unintended wt loss, pleuritic or exertional cp, hempoptysis, change in activity tolerance  orthopnea pnd or leg swelling.  Current Medications (verified): 1)  Simvastatin 40 Mg Tabs (Simvastatin) .... Take One Tablet By Mouth Daily At Bedtime 2)  Niaspan 500 Mg Cr-Tabs (Niacin (Antihyperlipidemic)) .[name].. 1 Tab Bid 3)  Carvedilol 3.125 Mg Tabs (Carvedilol) .... Take One Tablet By Mouth Twice A Day 4)  Enalapril Maleate 2.5 Mg Tabs (Enalapril Maleate) .... Take One Tablet By Mouth Twice A Day 5)  Aspirin 81 Mg Tbec (Aspirin) .... Take One Tablet By Mouth Daily 6)  Hydromet 5-1.5 Mg/23ml Syrp (Hydrocodone-Homatropine) .[name].. 1 Tsp Every 4-6 Hours As Needed  Allergies (verified): No Known Drug Allergies  Past History:  Past Medical History: 1. Coronary artery disease status post anterior wall myocardial     infarction in January 2008, complicated by out of hospital cardiac     arrest treated with a bare metal stent to left anterior descending. 2. Ejection fraction 40% improved to 53% by last Myoview. 3. Hyperlipidemia with low HDL. 4. Ankylosing spondylitis. 5. Exertional fatigue and shortness of breath.  6. Hypertension       - Try off ACE  November 03, 2009 due to cough  Social History: never smoker  Vital Signs:  Patient profile:   70 year old male Weight:      193 pounds O2 Sat:      94 % on Room air Temp:     98.1 degrees F oral Pulse rate:   66 / minute BP sitting:   116 / 60  (left arm)  Vitals Entered By: Vernie Murders (November 03, 2009 3:57 PM)  O2 Flow:  Room air  Physical Exam  Additional Exam:  hoarse amb wm nad HEENT: nl dentition, turbinates, and orophanx. Nl external ear canals without cough reflex NECK :  without JVD/Nodes/TM/ nl carotid upstrokes bilaterally LUNGS: no acc muscle use, clear to A and P bilaterally without cough on insp or exp maneuvers CV:  RRR  no s3 or murmur or increase in P2, no edema  ABD:  soft and nontender with nl excursion in the supine position. No bruits or organomegaly, bowel sounds nl MS:  warm without deformities, calf tenderness, cyanosis or clubbing SKIN: warm and dry without lesions   NEURO:  alert, approp, no deficits     CXR  Procedure date:  10/30/2009  Findings:      outside film:  ? vague rll infiltrate by report only  Impression & Recommendations:  Problem # 1:  COUGH (ICD-786.2)  Upper airway cough syndrome, so named because it's frequently impossible to sort out how much is LPR vs CR/sinusitis with  freq throat clearing generating secondary extra esophageal GERD from wide swings in gastric pressure that occur with throat clearing, promoting self use of mint and menthol lozenges that reduce the lower esophageal sphincter tone and exacerbate the problem further.  These symptoms are easily confused with asthma/copd by even experienced pulmonogists because they overlap so much. These are the same pts who not infrequently have failed to tolerate ace inhibitors,  dry powder inhalers or biphosphonates or report having reflux symptoms that don't respond to standard doses of PPI  Need to first stop ace inhibitors then regroup  ACE inhibitors are probably fueling  this problem because they inhibit the metabolism of Upper Air Bradykinin,  a potent mediator of upper airways inflammation. If they are the "fuel" then something else usually is the "spark" (eg viral uri, post nasal drainage, or reflux). In the era of ARB equivalency, at least in the short run, I recommend the ACE be stopped for a six week trial basis. If the problem resolves, the ACE could still be restarted depending on the severity of the severity of the original problem.   Orders: Est. Patient Level IV (81191)  Problem # 2:  ABNORMAL LUNG XRAY (ICD-793.1)  Prob asp injury RLL vs cap, already rx'd with Zpak with no evidence active infection.  F/u 4 weeks with cxr (placed in tickle file)  Orders: Est. Patient Level IV (47829)  Medications Added to Medication List This Visit: 1)  Hydromet 5-1.5 Mg/57ml Syrp (Hydrocodone-homatropine) .[name].. 1 tsp every 4-6 hours as needed 2)  Benicar 20 Mg Tabs (Olmesartan medoxomil) .... One half daily 3)  Tramadol Hcl 50 Mg Tabs (Tramadol hcl) .... One to two by mouth every 4-6 hours as needed  Patient Instructions: 1)  Return to see Dr Kriste Basque in 4 weeks with a cxr  but call Dr Sherene Sires in meantime if do not notice a slow but steady improvement in cough and need for cough suppression  2)  Take delsym two tsp every 12 hours and add tramadol 50 mg up to every 4 hours to suppress the urge to cough. Swallowing water or using ice chips/non mint and menthol containing candies (such as lifesavers or sugarless jolly ranchers) are also effective.  3)  GERD (REFLUX)  is a common cause of respiratory symptoms. It commonly presents without heartburn and can be treated with medication, but also with lifestyle changes including avoidance of late meals, excessive alcohol, smoking cessation, and avoid fatty foods, chocolate, peppermint, colas, red wine, and acidic juices such as orange juice. NO MINT OR MENTHOL PRODUCTS SO NO COUGH DROPS  4)  USE SUGARLESS CANDY INSTEAD (jolley  ranchers)  5)  NO OIL BASED VITAMINS  6)  Start benicar 20mg  one half daily in place of enalapril Prescriptions: TRAMADOL HCL 50 MG  TABS (TRAMADOL HCL) One to two by mouth every 4-6 hours as needed  #40 x 0   Entered and Authorized by:   Nyoka Cowden MD   Signed by:   Nyoka Cowden MD on 11/03/2009   Method used:   Electronically to        [facility]  [address] [id number]* (retail)       [address]       [place], [place]  [address]       Ph: [phone] or [phone]       Fax: [phone]   RxID:   [id number]

## 2010-09-11 NOTE — Progress Notes (Signed)
Summary: lab results  Phone Note Call from Patient Call back at [phone]   Caller: Patient Call For: nadel Reason for Call: Lab or Test Results Summary of Call: Patient requesting lab results. Initial call taken by: Lehman Prom,  July 19, 2010 8:50 AM  Follow-up for Phone Call        Pt never checked phone tree for lab results from 06-04-10 OV. Pt requesting results. Please advsie.Carron Curie CMA  July 19, 2010 9:21 AM   Additional Follow-up for Phone Call Additional follow up Details #1::        called and spoke with pt about his lab results---per SN---bs at 125--stay away from sweets and get weight down,  all other labs ok.  pt voiced his understanding of this and will do better diet. Randell Loop [facility]  July 19, 2010 10:09 AM

## 2010-09-11 NOTE — Assessment & Plan Note (Signed)
Summary: 4 wks f/u per mw / ok per leigh//kp   Primary Care Provider:  dr Kriste Basque  CC:  Return offic visit....  History of Present Illness: 70 y/o WF here for an ROV... he has mult medical problems & is followed regularly by DrBBrodie for Cards w/ hx of CAD, s/p AWMI w/ VFib arrest, subseq PTCA/ stent 1/08... he also has Hypercholesterolemia & followed in the [facility] on Simvastatin & Niaspan...   ~  December 05, 2009:  he was hosp 2/11 w/ viral gastroenteritis & improved w/ fluids... 3/11 he noted cough, "tickle in throat", clear sputum, & cough worse at night... the gastroent was assoc w/ N&V as well & he felt that this "burned my esoph"> Rx OTC PPI vs H2 blockers & antireflux regimen... he was also on an ACE inhibitor- Enalapril 2.5mg  Bid & this was stopped, w/ Benicar 20mg - 1/2 tab daily substit...  He reports that cough has resolved off the Enalapril w/ the aide of antireflux Rx & he feels that he is back to baseline... he feels sl weak, low energy & we note low BP today 100/54> therefore rec to stop the Benicar & follow BP at home...  I last saw pt 7/08 before our EMR> see prob list below:   Current Problems:   ALLERGIC RHINITIS (ICD-477.9) - hx AR w/ +allergy testing to ragweed & dust in the past.  COUGH (ICD-786.2) - eval 3/11 for dry irritative cough, on Enalapril, & cough resolved off ACE Rx.  Hx of ABNORMAL CHEST XRAY (ICD-793.1) - hx mild basilar pulm fibrosis on old CXR/ CT scan in 2002... there is also mild nonprogressive elevation of the right hemidiaphragm on old films...  CAD, NATIVE VESSEL (ICD-414.01) - he had an AWMI 1/08 w/ VFib arrest, successful resus, urgent cath w/ PTCA/ stent to LAD by DrBBrodie... on ASA 81mg /d, & CARVEDILOL 3.125mg Bid... prev Enalapril stopped due to cough & switched to Benicar> pt asked to stop Benicar due to BP= 100/54, weak/ no energy etc...  denies CP, palpit, syncope, dyspnea, swelling, etc...  HYPERCHOLESTEROLEMIA (ICD-272.0) - followed in the  [facility] Clinic on SIMVASTATIN 40mg - 1/2 tab daily & NIASPAN 500mg /d...  ~  FLP 10/10 on Simva40+Niasp500 showed TChol 94, TG 44, HDL 36, LDL 49... keep same Rx.  ~  FLP 4/11 on Simva40+Niasp500 showed TChol 61, TG 44, LDL 28, LDL 25... pt rec to decr to Simva20.  COLONIC POLYPS (ICD-211.3) - he reports colonoscopy in [place] 10/07 w/ 1 hyperplastic polyp removed> f/u planned 52yrs.  HEMORRHOIDS (ICD-455.6) - he reports hemorrhoid surg 10/10 in [place]...  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  Hx of OTHER DISORDER OF MUSCLE LIGAMENT AND FASCIA (ICD-728.89) - he had a spont hemorrhage into his right thigh 1996  ANXIETY (ICD-300.00) - his son was killed in 1998  Hx of MALARIA (ICD-084.6) - he has malaria while in the service   Allergies (verified): No Known Drug Allergies  Comments:  Nurse/Medical Assistant: The patient's medications and allergies were reviewed with the patient and were updated in the Medication and Allergy Lists.  Past History:  Past Medical History: 1. Coronary artery disease status post anterior wall myocardial     infarction in  January 2008, complicated by out of hospital cardiac     arrest treated with a bare metal stent to left anterior descending. 2. Ejection fraction 40% improved to 53% by last Myoview. 3. Hyperlipidemia with low HDL. 4. Ankylosing spondylitis. 5. Exertional fatigue and shortness of breath.  6. Hx of Hypertension - ACE stopped 3/11 due to cough.   ALLERGIC RHINITIS (ICD-477.9) COUGH (ICD-786.2) Hx of ABNORMAL CHEST XRAY (ICD-793.1) CAD, NATIVE VESSEL (ICD-414.01) HYPERCHOLESTEROLEMIA (ICD-272.0) COLONIC POLYPS  (ICD-211.3) HEMORRHOIDS (ICD-455.6) ANKYLOSING SPONDYLITIS (ICD-720.0) Hx of ROTATOR CUFF TEAR (ICD-727.61) GANGLION CYST, WRIST, LEFT (ICD-727.41) Hx of OTHER DISORDER OF MUSCLE LIGAMENT AND FASCIA (ICD-728.89) ANXIETY (ICD-300.00) Hx of MALARIA (ICD-084.6)  Past Surgical History: S/P bilat rotator cuff repairs by DrCaffrey in the 1990s S/P hemorrhoid surg in [place] 10/10  Family History: Reviewed history and no changes required.  Social History: Reviewed history from 11/03/2009 and no changes required. never smoker  Review of Systems      See HPI  The patient denies anorexia, fever, weight loss, weight gain, vision loss, decreased hearing, hoarseness, chest pain, syncope, dyspnea on exertion, peripheral edema, prolonged cough, headaches, hemoptysis, abdominal pain, melena, hematochezia, severe indigestion/heartburn, hematuria, incontinence, muscle weakness, suspicious skin lesions, transient blindness, difficulty walking, depression, unusual weight change, abnormal bleeding, enlarged lymph nodes, and angioedema.    Vital Signs:  Patient profile:   70 year old male Height:      68 inches Weight:      186.25 pounds BMI:     28.42 O2 Sat:      94 % on Room air Temp:     97.8 degrees F oral Pulse rate:   76 / minute BP sitting:   100 / 54  (right arm) Cuff size:   regular  Vitals Entered By: Randell Loop CMA (December 05, 2009 2:58 PM)  O2 Sat at Rest %:  94 O2 Flow:  Room air CC: Return offic visit... Is Patient Diabetic? No Pain Assessment Patient in pain? no      Comments meds updated today   Physical Exam  Additional Exam:  WD, WN, 70 y/o WM in NAD... GENERAL:  Alert & oriented; pleasant & cooperative... HEENT:  Steele/AT, EOM-wnl, PERRLA, EACs-clear, TMs-wnl, NOSE-clear, THROAT-clear & wnl. NECK:  Supple w/ fairROM; no JVD; normal carotid impulses w/o bruits; no thyromegaly or nodules palpated; no lymphadenopathy. CHEST:  Clear to P & A; without wheezes/ rales/ or  rhonchi. HEART:  Regular Rhythm; without murmurs/ rubs/ or gallops. ABDOMEN:  Soft & nontender; normal bowel sounds; no organomegaly or masses detected. EXT: without deformities, mild arthritic changes; no varicose veins/ venous insuffic/ or edema. NEURO:  CN's intact; motor testing normal; sensory testing normal; gait normal & balance OK. DERM:  No lesions noted; no rash etc...     MISC. Report  Procedure date:  12/05/2009  Findings:      DATA REVIEWED:   ~  Paper Chart from [employer] reviewed...  ~  [facility] records from 2/22-24/11 reviewed including H&P, DCSummary, XRays, Labs, etc...  ~  EMR notes from DrBBrodie, [facility], DrWert 11/03/09...    Impression & Recommendations:  Problem # 1:  Hx of ABNORMAL CHEST XRAY (ICD-793.1) Films from 2/11 were baseline films... he had [facility] CXR 3/11- noted.  Problem # 2:  COUGH (ICD-786.2) Cough resolved off the ACE RX...  Problem # 3:  CAD, NATIVE VESSEL (ICD-414.01) Known CAD followed by DrBB> stable on Coreg & off enalapril now... BP on the low side therefore stopping Benicar. The  following medications were removed from the medication list:    Benicar 20 Mg Tabs (Olmesartan medoxomil) ..... One half daily His updated medication list for this problem includes:    Aspirin 81 Mg Tbec (Aspirin) .[name]... Take one tablet by mouth daily    Carvedilol 3.125 Mg Tabs (Carvedilol) .[name]... Take one tablet by mouth twice a day  Problem # 4:  HYPERCHOLESTEROLEMIA (ICD-272.0) Followed in the [facility]-  Simva40 decr to Simva20 by [facility] after last labs... His updated medication list for this problem includes:    Simvastatin 40 Mg Tabs (Simvastatin) .[name]... Take 1/2  tablet by mouth daily at bedtime    Niaspan 500 Mg Cr-tabs (Niacin (antihyperlipidemic)) .[name]... 1 tab bid  Problem # 5:  COLONIC POLYPS (ICD-211.3) He gets his GI eval in [place] & he states that he is up to date...  Problem # 6:  ANKYLOSING SPONDYLITIS (ICD-720.0) He has numerous ortho  complaints & followed by drMurphy & Deveshwar w/ AS... not requiring NSAIDs etc... active c/o ganglion cyst left wrist & he is rec to call DrMurphy for Rx.  Problem # 7:  OTHER MEDICAL PROBLEMS AS NOTED>>>  Complete Medication List: 1)  Aspirin 81 Mg Tbec (Aspirin) .... Take one tablet by mouth daily 2)  Carvedilol 3.125 Mg Tabs (Carvedilol) .... Take one tablet by mouth twice a day 3)  Simvastatin 40 Mg Tabs (Simvastatin) .... Take 1/2  tablet by mouth daily at bedtime 4)  Niaspan 500 Mg Cr-tabs (Niacin (antihyperlipidemic)) .[name].. 1 tab bid   Immunization History:  Influenza Immunization History:    Influenza:  historical (05/30/2009)  Pneumovax Immunization History:    Pneumovax:  historical (08/22/2009)

## 2010-09-11 NOTE — Progress Notes (Signed)
Summary: lab work   Phone Note Call from Patient Call back at [phone]   Caller: Patient Reason for Call: Talk to Nurse Summary of Call: does he need blood work prior to appt in August for his yearly Initial call taken by: Migdalia Dk,  January 19, 2010 9:30 AM  Follow-up for Phone Call        Spoke with pt. regarding whether he needs to have lads done prior  his office visit on August/11. I let pt. know on the last office visit Dr. Juanda Chance does not mention whether p.t needs blood work prior visit.  MD will know what labs to order if needed,  ofter seen pt. Pt. verbalized understanding. Follow-up by: Ollen Gross, RN, BSN,  January 19, 2010 9:40 AM

## 2010-09-11 NOTE — Assessment & Plan Note (Signed)
Summary: Z6X      Allergies Added: NKDA  Visit Type:  Follow-up Primary Provider:  dr Kriste Basque  CC:  pt has had some pain due to his AS (Spine) Pt also has a little cold.  History of Present Illness: The patient is 70 years old and return for management of CAD. In January of 2008 he had an anterior wall MI with out-of-hospital V. fib arrest. He called 911 from his car and pulled over and when EMS arrived he fibrillated but was promptly shocked. He had bare-metal stent placed in the LAD. He's done very well since that time. His ejection fraction improved from 45% to 53% by his last Myoview.  Since his last visit a year ago he has had no palpitations or abnormal shortness of breath. This weekend he had some left-sided chest pain which was nonexertional and which has since resolved.  His biggest complaint is related to neck and back pain from ankylosing spondylitis.  Current Medications (verified): 1)  Aspirin 81 Mg Tbec (Aspirin) .... Take One Tablet By Mouth Daily 2)  Carvedilol 3.125 Mg Tabs (Carvedilol) .... Take One Tablet By Mouth Twice A Day 3)  Simvastatin 40 Mg Tabs (Simvastatin) .... Take 1/2  Tablet By Mouth Daily At Bedtime 4)  Niaspan 500 Mg Cr-Tabs (Niacin (Antihyperlipidemic)) .[name].. 1 Tab Bid  Allergies (verified): No Known Drug Allergies  Past History:  Past Medical History: Reviewed history from 12/05/2009 and no changes required. 1. Coronary artery disease status post anterior wall myocardial     infarction in January 2008, complicated by out of hospital cardiac     arrest treated with a bare metal stent to left anterior descending. 2. Ejection fraction 40% improved to 53% by last Myoview. 3. Hyperlipidemia with low HDL. 4. Ankylosing spondylitis. 5. Exertional fatigue and shortness of breath.  6. Hx of Hypertension - ACE stopped 3/11 due to cough.   ALLERGIC RHINITIS (ICD-477.9) COUGH (ICD-786.2) Hx of ABNORMAL CHEST XRAY (ICD-793.1) CAD, NATIVE VESSEL  (ICD-414.01) HYPERCHOLESTEROLEMIA (ICD-272.0) COLONIC POLYPS (ICD-211.3) HEMORRHOIDS (ICD-455.6) ANKYLOSING SPONDYLITIS (ICD-720.0) Hx of ROTATOR CUFF TEAR (ICD-727.61) GANGLION CYST, WRIST, LEFT (ICD-727.41) Hx of OTHER DISORDER OF MUSCLE LIGAMENT AND FASCIA (ICD-728.89) ANXIETY (ICD-300.00) Hx of MALARIA (ICD-084.6)  Review of Systems       ROS is negative except as outlined in HPI.   Vital Signs:  Patient profile:   70 year old male Height:      68 inches Weight:      184 pounds Pulse rate:   52 / minute BP sitting:   124 / 67  (left arm) Cuff size:   regular  Vitals Entered By: Burnett Kanaris, CNA (March 27, 2010 12:12 PM)  Physical Exam  Additional Exam:  Gen. Well-nourished, in no distress   Neck: No JVD, thyroid not enlarged, no carotid bruits Lungs: No tachypnea, clear without rales, rhonchi or wheezes Cardiovascular: Rhythm regular, PMI not displaced,  heart sounds  normal, no murmurs or gallops, no peripheral edema, pulses normal in all 4 extremities. Abdomen: BS normal, abdomen soft and non-tender without masses or organomegaly, no hepatosplenomegaly. MS: No deformities, no cyanosis or clubbing   Neuro:  No focal sns   Skin:  no lesions    Impression & Recommendations:  Problem # 1:  CAD, NATIVE VESSEL (ICD-414.01) S/P MI and stent LAD as in HPI. CP doesn't sound cardiac.  Stable His updated medication list for this problem includes:    Aspirin 81 Mg Tbec (Aspirin) .[name]... Take one tablet by  mouth daily    Carvedilol 3.125 Mg Tabs (Carvedilol) .[name]... Take one tablet by mouth twice a day  Orders: EKG w/ Interpretation (93000)  Problem # 2:  HYPERCHOLESTEROLEMIA (ICD-272.0) Low HDL but continue current Rx. His updated medication list for this problem includes:    Simvastatin 40 Mg Tabs (Simvastatin) .[name]... Take 1/2  tablet by mouth daily at bedtime    Niaspan 500 Mg Cr-tabs (Niacin (antihyperlipidemic)) .[name]... 1 tab bid  Patient Instructions: 1)  Your  physician recommends that you continue on your current medications as directed. Please refer to the Current Medication list given to you today. 2)  Your physician wants you to follow-up in: 1 year with Dr. Excell Seltzer.  You will receive a reminder letter in the mail two months in advance. If you don't receive a letter, please call our office to schedule the follow-up appointment.  Appended Document: [facility]     Clinical Lists Changes  Observations: Added new observation of FLU VAX: State Fluvax Nasal (04/20/2010 10:32)       Immunization History:  Influenza Immunization History:    Influenza:  state fluvax nasal (04/20/2010)

## 2010-09-13 NOTE — Progress Notes (Signed)
Summary: prescription changed  Phone Note Call from Patient   Caller: Patient Summary of Call: In October 2011 patient came for his annual and he advised ot that Dr. Juanda Chance had changed  his Niaspan 500 mg from two tablets daily to one tablet daily.His pharmcacy still has his old prescription on file with patient taking two aday and if this isn't corrected his insurance will not pay for this Niaspan 500 mg due to the formulary so he needs a new prescription with directions of one a day. He uses [facility] [address]. Patient can be reached at [phone] Initial call taken by: Vedia Coffer,  August 22, 2010 9:22 AM  Follow-up for Phone Call        Spoke with pt and verified msg.  He states needs new rx for niaspan.  Rx was called to the [facility] on [address].  Follow-up by: Vernie Murders,  August 22, 2010 10:31 AM    Prescriptions: NIASPAN 500 MG CR-TABS (NIACIN (ANTIHYPERLIPIDEMIC)) 1 TAB once daily  #30 x 11   Entered by:   Vernie Murders   Authorized by:   Michele Mcalpine MD   Signed by:   Vernie Murders on 08/22/2010   Method used:   Telephoned to ...       [facility]       [address]       [place], [place]  [address]       Ph: [phone]       Fax: [phone]   RxID:   [id number]

## 2010-09-14 NOTE — Miscellaneous (Signed)
Summary: Flu Vaccine / [facility]  Flu Vaccine / [facility]   Imported By: Lennie Odor 04/26/2010 15:39:28  _____________________________________________________________________  External Attachment:    Type:   Image     Comment:   External Document

## 2010-11-02 LAB — CBC
HCT: 45.9 % (ref 39.0–52.0)
Hemoglobin: 13.4 g/dL (ref 13.0–17.0)
Hemoglobin: 15.8 g/dL (ref 13.0–17.0)
MCHC: 34.5 g/dL (ref 30.0–36.0)
MCHC: 35 g/dL (ref 30.0–36.0)
MCV: 96.9 fL (ref 78.0–100.0)
RBC: 3.93 MIL/uL — ABNORMAL LOW (ref 4.22–5.81)
RBC: 4.74 MIL/uL (ref 4.22–5.81)
WBC: 15.4 10*3/uL — ABNORMAL HIGH (ref 4.0–10.5)
WBC: 9.5 10*3/uL (ref 4.0–10.5)

## 2010-11-02 LAB — POCT CARDIAC MARKERS
CKMB, poc: 1.1 ng/mL (ref 1.0–8.0)
Myoglobin, poc: 68.8 ng/mL (ref 12–200)

## 2010-11-02 LAB — BASIC METABOLIC PANEL
CO2: 28 mEq/L (ref 19–32)
Calcium: 7.4 mg/dL — ABNORMAL LOW (ref 8.4–10.5)
Chloride: 103 mEq/L (ref 96–112)
GFR calc Af Amer: >60 mL/min (ref >60)
Sodium: 137 mEq/L (ref 135–145)

## 2010-11-02 LAB — CARDIAC PANEL(CRET KIN+CKTOT+MB+TROPI)
CK, MB: 1.4 ng/mL (ref 0.3–4.0)
Relative Index: INVALID (ref 0.0–2.5)
Total CK: 51 U/L (ref 7–232)
Total CK: 60 U/L (ref 7–232)

## 2010-11-02 LAB — POCT I-STAT 3, ART BLOOD GAS (G3+): TCO2: 27 mmol/L (ref 0–100)

## 2010-11-02 LAB — COMPREHENSIVE METABOLIC PANEL
BUN: 25 mg/dL — ABNORMAL HIGH (ref 6–23)
Calcium: 8.8 mg/dL (ref 8.4–10.5)
Chloride: 101 mEq/L (ref 96–112)
GFR calc non Af Amer: >60 mL/min (ref >60)
Glucose, Bld: 151 mg/dL — ABNORMAL HIGH (ref 70–99)
Potassium: 3.3 mEq/L — ABNORMAL LOW (ref 3.5–5.1)
Sodium: 135 mEq/L (ref 135–145)
Total Protein: 7.2 g/dL (ref 6.0–8.3)

## 2010-11-02 LAB — CLOSTRIDIUM DIFFICILE EIA

## 2010-11-02 LAB — CULTURE, BLOOD (ROUTINE X 2): Culture: NO GROWTH

## 2010-11-02 LAB — DIFFERENTIAL
Basophils Absolute: 0 10*3/uL (ref 0.0–0.1)
Lymphs Abs: 0.4 10*3/uL — ABNORMAL LOW (ref 0.7–4.0)
Monocytes Absolute: 0.9 10*3/uL (ref 0.1–1.0)

## 2010-11-02 LAB — STOOL CULTURE

## 2010-11-05 ENCOUNTER — Other Ambulatory Visit: Payer: Self-pay

## 2010-11-06 ENCOUNTER — Telehealth: Payer: Self-pay | Admitting: Cardiology

## 2010-11-06 NOTE — Telephone Encounter (Signed)
LMTCB Debbie Ryana Montecalvo RN  

## 2010-11-06 NOTE — Telephone Encounter (Signed)
Pt calling to see if he needs lab work and when he needs a fu appt?

## 2010-11-06 NOTE — Telephone Encounter (Signed)
Pt calling re reminder call he received re appt 3-29 pt says he isn't on coumadin

## 2010-11-08 ENCOUNTER — Ambulatory Visit (INDEPENDENT_AMBULATORY_CARE_PROVIDER_SITE_OTHER): Payer: Self-pay

## 2010-11-08 DIAGNOSIS — R0989 Other specified symptoms and signs involving the circulatory and respiratory systems: Secondary | ICD-10-CM

## 2010-11-08 NOTE — Telephone Encounter (Signed)
I talked with pt by telephone. Pt last saw Dr Juanda Chance 03/2010. Dr Regino Schultze note indicates pt to see Dr Excell Seltzer in 1 year(August 2012). Pt is aware of this. Pt is also seen in the lipid clinic. Pt is asking when he should return for lab for lipid clinic. I told pt I would forward this to lipid clinic and ask them to follow-up with him about lab he may need.

## 2010-11-09 NOTE — Telephone Encounter (Signed)
Spoke with pt.  He actually had an appt with [facility] yesterday.  The automated system said coumadin Clinic and so he was confused.  Rescheduled labwork and lipid clinic appt for the next 2 weeks.

## 2010-11-12 ENCOUNTER — Other Ambulatory Visit (INDEPENDENT_AMBULATORY_CARE_PROVIDER_SITE_OTHER): Payer: Medicare Other | Admitting: *Deleted

## 2010-11-12 DIAGNOSIS — E7800 Pure hypercholesterolemia, unspecified: Secondary | ICD-10-CM

## 2010-11-12 DIAGNOSIS — Z79899 Other long term (current) drug therapy: Secondary | ICD-10-CM

## 2010-11-12 LAB — HEPATIC FUNCTION PANEL
Albumin: 4 g/dL (ref 3.5–5.2)
Alkaline Phosphatase: 50 U/L (ref 39–117)

## 2010-11-12 LAB — LIPID PANEL
LDL Cholesterol: 50 mg/dL (ref 0–99)
Total CHOL/HDL Ratio: 3
Triglycerides: 75 mg/dL (ref 0.0–149.0)
VLDL: 15 mg/dL (ref 0.0–40.0)

## 2010-11-22 ENCOUNTER — Ambulatory Visit (INDEPENDENT_AMBULATORY_CARE_PROVIDER_SITE_OTHER): Payer: Medicare Other

## 2010-11-22 VITALS — BP 120/70 | Wt 192.0 lb

## 2010-11-22 DIAGNOSIS — E7800 Pure hypercholesterolemia, unspecified: Secondary | ICD-10-CM

## 2010-11-22 NOTE — Patient Instructions (Addendum)
Stop taking niaspan. Continue taking simvastatin 20 mg daily (1/2 of the 40 mg tablet). Increase your exercise to 30 minutes a day, 5 days a week.

## 2010-11-22 NOTE — Assessment & Plan Note (Signed)
TC 101, TG 75 (goal <150), HDL 35, LDL 50 (goal < 70). Patient's lipid profile has continued to improve. He is at LDL goal. Because he is at goal and with recent results of AIM-High trial, do not believe that Niaspan is providing any extra benefit. Patient has decreased exercise as of late, and I believe his HDL may improve with increased exercise. Counseled to increase exercise to 30 minutes/day 5 days/week. He will stop taking the Niaspan and continue taking the simvastatin 40 mg daily. F/u in 6 months.

## 2010-11-22 NOTE — Progress Notes (Signed)
Patients present to lipid clinic for dyslipidemia follow up. Patient denies muscle aches or pains or other medication related problems. He is trying to follow a heart healthy diet. For breakfast he usually eats high fiber cereal and fruit, lunch- salad or chicken wrap, dinner-chicken or fish or salad. For exercise, he rides a stationary bike 10 min/day 4-5 days/week and walks frequently for his job working on [employer]. [facility] but [facility]. Current regimen includes: Niaspan 500 mg daily and simvastatin 40 mg daily. Medication compliance is good.

## 2010-11-26 ENCOUNTER — Telehealth: Payer: Self-pay | Admitting: Cardiology

## 2010-11-26 MED ORDER — CARVEDILOL 3.125 MG PO TABS: 3.1250 mg | ORAL_TABLET | Freq: Two times a day (BID) | ORAL | Status: DC

## 2010-11-26 NOTE — Telephone Encounter (Signed)
Pt needs carvedilol to be called in [facility]  Fax # [phone]

## 2010-11-30 ENCOUNTER — Encounter: Payer: Self-pay | Admitting: Pulmonary Disease

## 2010-12-04 ENCOUNTER — Encounter: Payer: Self-pay | Admitting: Pulmonary Disease

## 2010-12-04 ENCOUNTER — Ambulatory Visit (INDEPENDENT_AMBULATORY_CARE_PROVIDER_SITE_OTHER): Payer: Medicare Other | Admitting: Pulmonary Disease

## 2010-12-04 VITALS — BP 110/64 | HR 54 | Temp 97.5°F | Ht 68.0 in | Wt 192.6 lb

## 2010-12-04 DIAGNOSIS — Z23 Encounter for immunization: Secondary | ICD-10-CM

## 2010-12-04 DIAGNOSIS — B54 Unspecified malaria: Secondary | ICD-10-CM

## 2010-12-04 DIAGNOSIS — M459 Ankylosing spondylitis of unspecified sites in spine: Secondary | ICD-10-CM

## 2010-12-04 DIAGNOSIS — I2510 Atherosclerotic heart disease of native coronary artery without angina pectoris: Secondary | ICD-10-CM

## 2010-12-04 DIAGNOSIS — D126 Benign neoplasm of colon, unspecified: Secondary | ICD-10-CM

## 2010-12-04 DIAGNOSIS — F411 Generalized anxiety disorder: Secondary | ICD-10-CM

## 2010-12-04 DIAGNOSIS — E7800 Pure hypercholesterolemia, unspecified: Secondary | ICD-10-CM

## 2010-12-04 MED ORDER — TETANUS-DIPHTH-ACELL PERTUSSIS 5-2.5-18.5 LF-MCG/0.5 IM SUSP
0.5000 mL | Freq: Once | INTRAMUSCULAR | Status: AC
  Administered 2010-12-04: 0.5 mL via INTRAMUSCULAR

## 2010-12-04 NOTE — Patient Instructions (Signed)
Today we updated your meds in our new EPIC system...    Continue your current meds the same...  Today we gave you the combination Tetanus vaccine called the TDAP (it should be good for 10 yrs)...  Call for any problems... Let's plan a follow up appt in 6 months & we will plan a f/u CXR & FASTING blood work at that time.Marland KitchenMarland Kitchen

## 2010-12-04 NOTE — Progress Notes (Signed)
Subjective:    Patient ID: [name], male    DOB: [birth year], 70 y.o.   MRN: [id number]  HPI 70 y/o WF here for an ROV... he has mult medical problems & is followed regularly by DrBBrodie for Cards w/ hx of CAD, s/p AWMI w/ VFib arrest, subseq PTCA/ stent 1/08... he also has Hypercholesterolemia & followed in the LipidClinic on Simvastatin & Niaspan...  ~  December 05, 2009:  he was hosp 2/11 w/ viral gastroenteritis & improved w/ fluids... 3/11 he noted cough, "tickle in throat", clear sputum, & cough worse at night... the gastroent was assoc w/ N&V as well & he felt that this "burned my esoph"> Rx OTC PPI vs H2 blockers & antireflux regimen... he was also on an ACE inhibitor- Enalapril 2.5mg  Bid & this was stopped, w/ Benicar 20mg - 1/2 tab daily substit...  He reports that cough has resolved off the Enalapril w/ the aide of antireflux Rx & he feels that he is back to baseline... he feels sl weak, low energy & we note low BP today 100/54> therefore rec to stop the Benicar & follow BP at home...  NOTE: I last saw pt 7/08 before our EMR> see prob list below:  ~  June 04, 2010:  6 month ROV doing well- no new complaints or concerns... he saw DrBrodie 8/11- stable & no changes made... also followed in the Lipid Clinic w/ labs done 9/11 & improved- Niaspan decr to just one daily... Pulm is stable w/o cough etc;  GI is followed in [place] & up to date;  Ortho probles addressed by DrMurphy & Deveshwar- on Celebrex & OTC anti-inflamm meds Prn... he had the Pneumovax last yr age 76, & already had the 2011 Flu shot.  ~  December 04, 2010:  62mo ROV doing well w/o new complaints or concerns;  Followed in the Lipid clinic & notes reviewed- they stopped his Niaspan recently;  He last saw DrBBrodie 2011 & stable- awaiting appt w/ his replacement physician;  We reviewed his fasting blood work 10/11> see below...         Problem List:    ALLERGIC RHINITIS (ICD-477.9) - hx AR w/ +allergy testing to ragweed & dust in  the past.  COUGH (ICD-786.2) - eval 3/11 for dry irritative cough, on Enalapril, & cough resolved off ACE Rx.  Hx of ABNORMAL CHEST XRAY (ICD-793.1) - hx mild basilar pulm fibrosis on old CXR/ CT scan in 2002... there is also mild nonprogressive elevation of the right hemidiaphragm on old films...  CAD, NATIVE VESSEL (ICD-414.01) - he had an AWMI 1/08 w/ VFib arrest, successful resus, urgent cath w/ PTCA/ stent to LAD by DrBBrodie... on ASA 81mg /d, & CARVEDILOL 3.125mg Bid... prev Enalapril stopped due to cough & switched to Benicar> pt asked to stop Benicar due to BP= 100/54, weak/ no energy etc...  denies CP, palpit, syncope, dyspnea, swelling, etc... ~  2DEcho 3/08 showed mild septal HK, w/ LVF at lower limit of norm. ~  Myoview 8/09 showed ? sm inferior MI vs diaph attenuation, no ischemia, EF= 56%.  HYPERCHOLESTEROLEMIA (ICD-272.0) - followed in the Lipid Clinic on SIMVASTATIN 40mg - 1/2 tab daily & NIASPAN 500mg - 2/d... ~  FLP 10/10 on Simva40+Niasp1000 showed TChol 94, TG 44, HDL 36, LDL 49... keep same Rx. ~  FLP 4/11 on Simva40+Niasp1000 showed TChol 61, TG 44, LDL 28, LDL 25... pt rec to decr to Simva20. ~  FLP 9/11 on Simva20+Niasp1000 showed TChol 103, TG 60, HDL 35, LDL  56... LipClinic decr the Niaspan to 1/d. ~  4/12:  Lipid Clinic discontinued his Niacin Rx...  COLONIC POLYPS (ICD-211.3) ~  colonoscopy 11/02 by Dorris Singh showed divertics, hems, otherw neg. ~  colonoscopy in [place] DrPeters 10/07 w/ 1 hyperplastic polyp removed> f/u planned 78yrs.  HEMORRHOIDS (ICD-455.6) - he reports hemorrhoid surg 10/10 in [place]...  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  Hx of OTHER  DISORDER OF MUSCLE LIGAMENT AND FASCIA (ICD-728.89) - he had a spont hemorrhage into his right thigh 1996  ANXIETY (ICD-300.00) - his son was killed in 1998  Hx of MALARIA (ICD-084.6) - he has malaria while in the service  HEALTH MAINTENANCE: ~  GI:  followed by DrPeters in [place] & up to date on colonoscopies... ~  GU:  exam is neg and PSA= 2.64 ~  Immuniz:  he gets the Flu vaccine each Fall... had PNEUMOVAX in 2010 at age 82.   No past surgical history on file.  Outpatient Encounter Prescriptions as of 12/04/2010  Medication Sig Dispense Refill  . aspirin 81 MG EC tablet Take 81 mg by mouth daily.        . carvedilol (COREG) 3.125 MG tablet Take 1 tablet (3.125 mg total) by mouth 2 (two) times daily with a meal.  60 tablet  1  . simvastatin (ZOCOR) 40 MG tablet Take 20 mg by mouth at bedtime.         No Known Allergies   Review of Systems    The patient denies fever, chills, sweats, anorexia, fatigue, weakness, malaise, weight loss, sleep disorder, blurring, diplopia, eye irritation, eye discharge, vision loss, eye pain, photophobia, earache, ear discharge, tinnitus, decreased hearing, nasal congestion, nosebleeds, sore throat, hoarseness, chest pain, palpitations, syncope, dyspnea on exertion, orthopnea, PND, peripheral edema, cough, dyspnea at rest, excessive sputum, hemoptysis, wheezing, pleurisy, nausea, vomiting, diarrhea, constipation, change in bowel habits, abdominal pain, melena, hematochezia, jaundice, gas/bloating, indigestion/heartburn, dysphagia, odynophagia, dysuria, hematuria, urinary frequency, urinary hesitancy, nocturia, incontinence, back pain, joint pain, joint swelling, muscle cramps, muscle weakness, stiffness, arthritis, sciatica, restless legs, leg pain at night, leg pain with exertion, rash, itching, dryness, suspicious lesions, paralysis, paresthesias, seizures, tremors, vertigo, transient blindness, frequent falls, frequent headaches, difficulty walking, depression,  anxiety, memory loss, confusion, cold intolerance, heat intolerance, polydipsia, polyphagia, polyuria, unusual weight change, abnormal bruising, bleeding, enlarged lymph nodes, urticaria, allergic rash, hay fever, and recurrent infections.     Objective:   Physical Exam      WD, WN, 70 y/o WM in NAD... GENERAL:  Alert & oriented; pleasant & cooperative... HEENT:  [place]/AT, EOM-wnl, PERRLA, EACs-clear, TMs-wnl, NOSE-clear, THROAT-clear & wnl. NECK:  Supple w/ fairROM; no JVD; normal carotid impulses w/o bruits; no thyromegaly or nodules palpated; no lymphadenopathy. CHEST:  Clear to P & A; without wheezes/ rales/ or rhonchi. HEART:  Regular Rhythm; without murmurs/ rubs/ or gallops. ABDOMEN:  Soft & nontender; normal bowel sounds; no organomegaly or masses detected. RECTAL:  prostate 3+ smooth w/o nodules, rectal neg- stool heme neg. EXT: without deformities, mild arthritic changes; no varicose veins/ venous insuffic/ or edema. NEURO:  CN's intact; motor testing normal; sensory testing normal; gait normal & balance OK. DERM:  No lesions noted;  no rash etc...   Assessment & Plan:   CAD>  followed by DrBB & stable on ASA, BBlocker, Statin;  Continue Rx + diet & exercise...  CHOL>  Followed in the Lipid Clinic & they stopped his Niacin recently, continue Sima40...  GI> stable & up to date...  ORTHO>  Hx AS, etc> he uses Tylenol for pain & avoids NSAIDs as he thinks the Celebrex may have caused his MI...  Other medical issues as noted.Marland KitchenMarland Kitchen

## 2010-12-05 ENCOUNTER — Telehealth: Payer: Self-pay | Admitting: Pulmonary Disease

## 2010-12-05 NOTE — Telephone Encounter (Signed)
[id number] x1 

## 2010-12-06 NOTE — Telephone Encounter (Signed)
Spoke w/ [facility] on [address] and advised them pt is suppose to be taking 1/2 tablet of 40 mg. They advised me they will fix pt prescription. I advised pt of this and he states he will start taking 1/2 tablet daily. Nothing further was needed

## 2010-12-06 NOTE — Telephone Encounter (Signed)
Per last ov note and per SN---pt is to take simvastatin 40mg     Take 1/2 tablet by mouth daily.  thanks

## 2010-12-06 NOTE — Telephone Encounter (Signed)
Spoke w/ pt and he states he has been taking his simvastatin 40 mg 1 tablet qd. Pt states when he was here on 12/04/10 he was advised to take 1/2 tablet daily. Pt states his prescription bottle states he is to take 1 whole tablet daily. I advised pt according to OV note he is to take 1/2 tablet daily. Pt wants clarification from SN on how he is to take his simvastatin and to inform his pharmacy as well. Please advise Dr. Kriste Basque. Thanks  Carver Fila, CMA

## 2010-12-17 ENCOUNTER — Encounter: Payer: Self-pay | Admitting: Pulmonary Disease

## 2010-12-25 NOTE — Assessment & Plan Note (Signed)
[facility]                            CARDIOLOGY OFFICE NOTE   NAME:Kuhnert, JOHAN CREVELING                      MRN:          [id number]  DATE:03/11/2007                            DOB:          [birth year]    CARDIOLOGIST:  Dr. Charlies Constable.   PRIMARY CARE PHYSICIAN:  Dr. Alroy Dust.   HISTORY OF PRESENT ILLNESS:  Mr. Rizzo is a 70 year old male patient  with a history of out-of-hospital V-fib arrest and anterior wall  myocardial infarction, treated with a drug-eluting stent to the LAD who  has been in the Via trial.  He returns today for followup.  He notes  that he is doing well without any chest pain, shortness of breath,  syncope or near syncope.  He does note that since he stopped the study  drug he has had a lot of low back pain.  He does have ankylosing  spondylitis.  He also notes that he has felt a little bit more fatigued  than usual since stopping the study drug.  Of note, he was recently  placed on Flomax by Dr. Kriste Basque secondary to questionable BPH.  He stopped  this on his own this past weekend.   CURRENT MEDICATIONS:  1. Carvedilol 3.125 mg b.i.d.  2. Enalapril 2.5 mg b.i.d.  3. Plavix 75 mg daily.  4. Aspirin 325 mg daily.  5. Lipitor 40 mg nightly.  6. Via study drug recently discontinued.   ALLERGIES:  No known drug allergies.   PHYSICAL EXAMINATION:  He is a well-nourished, well-developed male in no  distress.  Blood pressure 98/61, pulse is 49, weight 167 pounds.  HEENT:  Normal.  NECK:  Without JVD.  CARDIAC:  Normal S1, S2, regular rate and rhythm.  LUNGS:  Clear to auscultation bilaterally.  ABDOMEN:  Soft and nontender.  EXTREMITIES:  Without edema.  Calves soft, nontender,  SKIN:  Warm and dry.  NEUROLOGIC:  He is alert and oriented x3.  Cranial nerves II-XII grossly  intact.   Electrocardiogram in the office today reveals sinus bradycardia with a  heart rate of 49, normal axis, no acute changes.   IMPRESSION:  1.  Coronary artery disease.      a.     Status post out-of-hospital V-fib arrest.      b.     Anterior wall myocardial infarction treated with a drug-       eluting stent to the left anterior descending.  2. Ischemic cardiomyopathy, an ejection fraction of 30% to 40%,      improved, normal by recent echocardiogram.  3. Nonischemic stress Myoview study, February 16, 2007.  4. Via trial.  5. Dyslipidemia.  6. Ankylosing spondylitis.  7. History of hypertension.  8. Sinus bradycardia.   PLAN:  The patient has completed the Via trial.  He noted to me today  that he has had some mild fatigue since stopping the study drug.  He  does have some bradycardia on his electrocardiogram, but he has had slow  heart rates in the past.  I did not make any medication changes today.  He  also recently stopped Flomax.  This may have contributed some to his  fatigue.  He does see Dr. Juanda Chance back in September.  I have recommended  that we continue the same medical therapy and see how he does over the  next 1-2 months.  If he continues to feel fatigued, we may want to cut  back on his beta blocker versus possibly setting him up for a sleep  study.  Of note, he does describe some mild daytime hypersomnolence and  reports a h/o snoring.      Tereso Newcomer, PA-C  Electronically Signed      Luis Abed, MD, [facility]  Electronically Signed   SW/MedQ  DD: 03/11/2007  DT: 03/12/2007  Job #: [id number]

## 2010-12-25 NOTE — Assessment & Plan Note (Signed)
[facility]                               LIPID CLINIC NOTE   NAME:FIELDSDauntae, [name]                      MRN:          [id number]  DATE:11/26/2007                            DOB:          [birth year]    Return office visit for lipid clinic.   PAST MEDICAL HISTORY:  1. Hyperlipidemia.  2. Coronary artery disease, status post anterior wall MI with a drug-      eluting stent to his LAD.  3. Benign prostate hyperplasia.  4. Ejection fraction of 40%.   MEDICATIONS:  1. Carvedilol 3.125 mg twice daily.  2. Enalapril 2.5 mg twice daily.  3. Plavix 75 mg daily.  4. Aspirin 325 mg daily.  5. Lipitor 40 mg at bedtime.   VITAL SIGNS:  Weight 183 pounds, blood pressure 120/70 and heart rate  70.   LABORATORY DATA:  Total cholesterol 100, triglyceride 133, HDL 29, LDL  44.  AST 26, ALT 35 total bili 0.8, alkaline phosphatase 61.   ASSESSMENT:  [name] is a very pleasant gentleman who returns to  lipid clinic today with no chest pain, no shortness of breath, no  myalgias or other issues to complain of.  He is compliant with current  medication regimen.  Apparently he has had some questions and confusion  regarding his Statin therapy.  I had previously handwritten Niaspan  prescriptions for him in October and again in January of which he has  never had filled.  He had been previously compliant with his Lipitor 40  mg at bedtime,  however, at last visit in January he had stopped taking  that because of some confusion regarding his medications.  Somewhere  around that time, he had requested to change to generic simvastatin and  had confusion regarding  which to stop and which to continue.  Between  February and March, he had been taking both simvastatin and Lipitor  because of a misunderstanding and miscommunication regarding which drug  he should be taking.  It has now been corrected.  He is to finish his  Lipitor that he has at home and will start  simvastatin 40 mg daily.  He  has requested to take a generic Statin drug.  Handwritten directions  have been given.  An E-scribe for simvastatin 40 mg was put through the  computer with a do not fill until May direction on it.  We have also E-  scribed Niaspan 500 mg to begin today.  Again, he had never filled the  handwritten prescriptions I had given him in October and January.  As  far as the drug interaction of simvastatin and atorvastatin combination,  his LFTs are within normal limits.  He has no complaints of myalgias or  any other complaint that would allude to any problems of both  medications being taken simultaneously.  He has not had any simvastatin  since the beginning of April.   LABORATORY DATA:  His total cholesterol is at goal of less than 200,  triglycerides at goal of less than 150, HDL at goal of less than 70  and  LDL is less than goal of greater than 40, which hopefully with exercise  and Niaspan will increase to approach goal of 40.  He continues to  exercise three to four times a week where he does 45 minutes of cardio  followed by 10-15 minutes of weight training.  He continues to eat a low-  fat, low-carbohydrate diet and I have encouraged him to continue this  lifestyle modification.  He seems to be very proactive in his health and  his healthy lifestyle since his MI and he has had no complaints or  problems since that time.   PLAN:  1. Handwritten directions were given to Mr. Tootle to finish the      remaining Lipitor he has at home, then to fill prescription for      simvastatin 40 mg daily.  He should not take both of these      simultaneously.  Again, this was all written out in direction for      him.  Niaspan 500 mg was E-scribed and directions to take at      bedtime were given.  I have also directed him and handwritten      directions were written to take aspirin 325 mg 30 minutes prior to      bed with a small snack and to then take his Niaspan at  bedtime.  If      any hot flashes or flushing occurs for more than three nights, to      discontinue the medication and give the office a call.  Phone      number was also given to him.  2. Continue with diet and exercise.  3. Follow-up visit in three months for lipid panel and LFTs to assess      compliance and answer questions and make any medication changes      that will be needed at that time.      Leota Sauers, PharmD  Electronically Signed      Jesse Sans. Daleen Squibb, MD, [facility]  Electronically Signed   LC/MedQ  DD: 11/26/2007  DT: 11/26/2007  Job #: [id number]

## 2010-12-25 NOTE — Assessment & Plan Note (Signed)
[facility]                               LIPID CLINIC NOTE   NAME:FIELDSCoran, [name]                      MRN:          [id number]  DATE:05/14/2007                            DOB:          1941/06/08    PAST MEDICAL HISTORY:  Anterior wall acute myocardial infarction treated  with drug eluting stent to his LAD.  Out of hospital V-fib arrest.  Benign prostatic hyperplasia.   MEDICATIONS:  1. Carvedilol 3.125 mg twice daily.  2. Enalapril 2.5 mg twice daily.  3. Plavix 75 mg daily.  4. Aspirin 325 mg daily.  5. Lipitor 40 mg daily.  6. Niaspan 500 mg each evening.   PHYSICAL EXAMINATION:  VITAL SIGNS:  Weight 178 pounds, blood pressure  118/60, heart rate 60.   LABORATORY DATA:  Total cholesterol 101, triglycerides 101, LDL 49, HDL  32.  LFT's within normal limits.   ASSESSMENT:  The patient is a very pleasant gentleman who comes to the  lipid clinic today with no chest pain, no shortness of breath, no muscle  aches and pains.  He is compliant with current medication regimen.  He  has been very compliant to exercise and heart healthy diet since his  acute MI in January of 2008.  He states that he never wants to have  anything like this happen to him again.  He has a total cholesterol goal  of less than 200, triglycerides goal less than 150, LDL goal less than  70, HDL goal greater than 40.  He eats a heart healthy diet with oatmeal  in the morning with almonds or walnuts and some type of berry.  He eats  a healthy lunch with lots of vegetables and a healthy dinner, very low  fat, low carbohydrate.  He states that he stays away from white potatoes  and white bread, he eats wheat bread and sweet potatoes, lean meat, and  vegetables.  He rarely snacks throughout the day unless he has a handful  of walnuts or almonds which he also uses to help increase his HDL.  He  exercises every other day by walking 2+ miles in about 30 minutes at the  gym.  He  also either swims, bikes, does the elliptical, or row machine  for another 15-20 minutes at the gym and has just recently started doing  some arm weights.  He says that he feels great and is not having any  problems.   PLAN:  1. Continue current medications adding Niaspan 1000 mg daily.  He was      educated regarding potential side effects of flushing hot flashes      with the Niaspan and instructed to take an aspirin 30 minutes prior      to the Niaspan and take Niaspan prior to going to bed.  2. Continue heart healthy diet.  3. Continue exercise.  4. Follow-up visit in four months for lipid panel, LFT's, and we will      make adjustments if needed at that time.      Leota Sauers, PharmD  Electronically Signed  Thomas C. Daleen Squibb, MD, [facility]  Electronically Signed   LC/MedQ  DD: 05/14/2007  DT: 05/14/2007  Job #: [id number]

## 2010-12-25 NOTE — Assessment & Plan Note (Signed)
[facility]                            CARDIOLOGY OFFICE NOTE   NAME:FIELDSDeral, Schellenberg                      MRN:          [id number]  DATE:04/09/2007                            DOB:          [birth year]    CLINICAL HISTORY:  Mr. Stovall is 70 years old and had an out of hospital  V-fib arrest with an anterior wall myocardial infarction on January 8  and was brought to [facility] by EMS.  Underwent emergency angioplasty with  stenting with a drug-eluting stent to the LAD.  He has done well since  that time.  His ejection fraction has improved to 30%-40% on last echo.  He has had no recent chest pain, shortness of breath or palpitations.   He has been doing quite well and has had no recent symptoms of chest  pain, shortness of breath or palpitations.   PAST MEDICAL HISTORY:  1. Hyperlipidemia with a low HDL.  2. Ankylosing spondylitis.   CURRENT MEDICATIONS:  Carvedilol, Enalapril, Plavix, aspirin, Lipitor.  He was on the __________  study drug but he has now completed this  study.   EXAMINATION:  Blood pressure is 92/61, the pulse 52 and regular.  There  was no venous distention.  The carotid pulses were full without bruits.  CHEST:  Clear.  CARDIAC:  Rhythm was regular.  Heart sounds were normal, there were no  murmurs or gallops.  ABDOMEN:  Soft with normal bowel sounds.  There is no  hepatosplenomegaly.  Peripheral pulses were full, there is no peripheral edema.   His electrocardiogram showed Q waves in V1 and V2.   IMPRESSION:  1. Anterior wall myocardial infarction August 19, 2006 complicated by      out of hospital V-fib arrest treated with a drug-eluting stent to      the left anterior descending, now stable.  2. Ejection fraction lower limits of normal by echocardiogram March      2008.  3. Hyperlipidemia with low HDL.  4. Ankylosing spondylitis.   RECOMMENDATIONS:  I think Mr. Notaro is doing quite well.  We will plan  to get an  echocardiogram at the end of the year and we will plan to  start him on __________  40/20 because of his low HDL.  His LDL is quite  low and I think __________  would be a better drug  than Lipitor.  Will ask him to take this at night a half hour after  aspirin and a snack and I will have him see Shelby Dubin in the Lipid  Clinic in 1 month.     Bruce Elvera Lennox Juanda Chance, MD, [facility]  Electronically Signed    BRB/MedQ  DD: 04/09/2007  DT: 04/09/2007  Job #: [id number]

## 2010-12-25 NOTE — Assessment & Plan Note (Signed)
[facility]                               LIPID CLINIC NOTE   NAME:FIELDSGahel, [name]                      MRN:          [id number]  DATE:09/03/2007                            DOB:          May 02, 1941    Return office visit for lipid clinic.   PAST MEDICAL HISTORY:  Anterior wall acute myocardial infarction, drug-  eluting stent to his LAD, benign prostatic hyperplasia.  Hyperlipidemia.   MEDICATIONS:  Carvedilol 3.125 mg twice daily, enalapril 2.5 mg twice  daily, Plavix 75 mg daily, aspirin 325 mg daily, Lipitor 40 mg daily  Niaspan 1000 mg each evening.   LABORATORY DATA:  Total cholesterol 93, triglyceride 48, LDL 54, HDL  29.5.  LFTs within normal limits.   VITAL SIGNS:  Weight 185 pounds.  Blood pressure 122/68, heart rate 60.   ASSESSMENT:  [name] is a very pleasant 70 year old gentleman who  returns to the Lipid Clinic today with no chest pain, no shortness of  breath, no muscle aches and pains.  He is tolerating current medications  well.  He misunderstood at the last visit and stopped taking his Lipitor  approximately 6 weeks ago and started taking Niaspan 1000 mg.  He has  not had any hot flashes or flushing with the Niaspan, which he takes in  the evening with an aspirin.  His total cholesterol at goal of less than  200, triglycerides at goal of less than 150.  LDL goal less than 70 and  HDL less than goal of greater than 40.  Non-HDL is at goal less than  914.  After discussion with [name] today he is willing to continue  the Niaspan 1000 mg and restart his Lipitor 40 mg tablet to control his  hyperlipidemia.  He has a gain of 6 pounds over the last 3 months he.  Says that he has been eating much more carbohydrates and snacks over the  holiday season, although in the last 2-3 weeks he has restarted his  exercise regimen, which is consistent about 45 minutes of cardio 3 to 4  times a week and followed by 10-15 minutes of weight  training twice a  week.  He had stopped doing this over the holidays but has now  restarted.  He also has restarted his low fat, low carbohydrate diet,  eating mostly oatmeal or high-fiber cereal in the morning with fruits  and vegetables for lunch and has added walnuts and almonds to help  increase his HDL.  We did go over portion sizes and the amount of nuts  that should be used with his diet.  He rarely eats any snacks throughout  the day, nor is he eat biscuits or fried food.  He has also increased  the amount of vision in his diet.  We went over several different types  of fish that would help increase his HDL also.  He is feeling fine, not  having any problems and is very adamant about his lifestyle  modifications to help prevent a second heart attack.   PLAN:  1. Restart Lipitor  40 mg daily.  2. Continue niacin 1000 mg daily.  3. Continue lifestyle modification.  4. Follow-up visit in 3 months for lipid panel and LFTs, and will make      adjustments in the at that time.      Leota Sauers, PharmD  Electronically Signed      [name] Sans. Daleen Squibb, MD, [facility]  Electronically Signed   LC/MedQ  DD: 09/03/2007  DT: 09/03/2007  Job #: [id number]

## 2010-12-25 NOTE — Assessment & Plan Note (Signed)
[facility]                            CARDIOLOGY OFFICE NOTE   NAME:FIELDSFareed, [name]                      MRN:          [id number]  DATE:09/15/2007                            DOB:          Aug 29, 1940    PRIMARY CARE PHYSICIAN:  Alroy Dust, MD.   CLINICAL HISTORY:  [name] is 70 years old and returned for followup  and management of his coronary heart disease.  On August 19, 2006 he  developed chest pain while driving and stopped and called EMS.  Just  after EMS arrived, he defibrillated and was promptly resuscitated and  taken to [facility] for intervention where he had a bare-metal stent  placed to the LAD.  His circumflex and right coronary arteries were free  of major obstruction.  His left ventricular function was initially 40%  and this has improved to near normal on his last echocardiogram.   He says he has been doing quite well.  He has had no chest pain,  shortness of breath or palpitations.  He was skiing the black slopes  recently without any symptoms.  He has concerns about not getting his  heart rate up past 115, but I reassured him that the carvedilol  contributed to that, and I think 105 to 115 is a good target for him.   PAST MEDICAL HISTORY:  Significant for hyperlipidemia.  He has been put  on Niaspan and followed by Shelby Dubin.   CURRENT MEDICATIONS:  1. Carvedilol.  2. Enalapril.  3. Plavix.  4. Lipitor.  5. Niaspan.   PHYSICAL EXAMINATION:  The blood pressure is 127/76 and the pulse 60 and  regular.  There was no venous distention.  Carotid pulses were full without  bruits.  CHEST:  Clear.  CARDIAC:  Rhythm was regular.  I could hear no murmurs or gallops.  The abdomen was soft with normal bowel sounds.  Peripheral pulses were full and there was no peripheral edema.   An EKG was normal.   IMPRESSION:  1. Anterior wall myocardial infarction, August 19, 2006, complicated      by out-of-hospital D-fib arrest  and treated with a bare-metal stent      to the left anterior descending.  2. Good left ventricular function by echocardiogram March 2008.  3. Hyperlipidemia with low HDL.  4. Ankylosing spondylitis.   RECOMMENDATIONS:  I think [name] is doing excellent; however, his  weight is up about 18 pounds, and I encouraged him to get that down.  His HDL is also somewhat low, and he is going to continue to work with  Shelby Dubin regarding that.  I told him getting his weight down would  help with this.  I will plan to see him back in 6 months, and I will  probably put him on yearly followup after that visit.     Bruce Elvera Lennox Juanda Chance, MD, [facility]  Electronically Signed    BRB/MedQ  DD: 09/15/2007  DT: 09/16/2007  Job #: [id number]

## 2010-12-25 NOTE — Assessment & Plan Note (Signed)
[facility]                            CARDIOLOGY OFFICE NOTE   NAME:[name], [name]                      MRN:          [id number]  DATE:03/15/2008                            DOB:          Oct 11, 1940    PRIMARY CARE PHYSICIAN:  Lonzo Cloud. Kriste Basque, MD   CLINICAL HISTORY:  Mr. Daywalt is 70 years old and suffered an anterior  wall myocardial infarction in January 2008.  He was driving his car and  developed chest pain and called EMS and he was fibrillated shortly after  their arrival.  We treated his occluded LAD with a bare metal stent and  his ejection fraction improved from 40% to 53% on last Myoview scan.   He says he has been doing fairly well.  He does say that he gets more  short of breath unexpected with swimming, which she has been doing  increasingly lately.  He also notices that sometimes hits the wall  after exercise.  With other exercises such as elliptically he seems to  be fairly well.   She has had no palpitations or chest pain.   PAST MEDICAL HISTORY:  Significant for hyperlipidemia with a low HDL.  He is been seen in Lipid Clinic and put on Niaspan.   CURRENT MEDICATIONS:  1. Carvedilol 3.125 b.i.d.  2. Enalapril 2.5 b.i.d.  3. Plavix 75 mg daily.  4. Aspirin.  5. Simvastatin 40 mg daily.  6. Niaspan 500 mg daily.   He did indicate he has been having easy bruisability.   PHYSICAL EXAMINATION:  VITAL SIGNS:  The blood pressure is 114/70 and  pulse 66 and regular.  GENERAL:  There was no venous distension.  The carotid pulses were full  without bruits.  CHEST:  Clear.  CARDIAC:  Rhythm was regular.  There were no murmurs or gallops.  ABDOMEN:  Soft with normal bowel sounds.  EXTREMITIES:  Peripheral pulses were full with peripheral edema.  There  was papular rash in the right medial foot.   IMPRESSION:  1. Coronary artery disease status post anterior wall myocardial      infarction in January 2008, complicated by out of  hospital cardiac      arrest treated with a bare metal stent to left anterior descending.  2. Ejection fraction 40% improved to 53% by last Myoview.  3. Hyperlipidemia with low HDL.  4. Ankylosing spondylitis.  5. Exertional fatigue and shortness of breath.   RECOMMENDATIONS:  I am not certain regarding the etiology of Mr. Mellette  shortness of breath and fatigue.  I think it is important to rule out  whether this is an ischemic equivalent.  We have arranged him to have an  exercise rest stress Myoview scan in the near future.  His HDL is still  low.  We will increase his Niaspan from 500 daily to 500 b.i.d. get a  lipid and liver as well as a BMP and CBC in 2 months.  Because of his  easy bruisability, we will go ahead and stop the Plavix and reduce his  aspirin 81 mg a day.  I will plan to see him back in a year or sooner if  he has any recurrent problems.  He indicates he will plan to see Dr.  Londell Moh regarding the rash on his foot.     Bruce Elvera Lennox Juanda Chance, MD, [facility]  Electronically Signed    BRB/MedQ  DD: 03/15/2008  DT: 03/16/2008  Job #: [id number]

## 2010-12-25 NOTE — Assessment & Plan Note (Signed)
[facility]                               LIPID CLINIC NOTE   NAME:FIELDSJacobb, Jordan                      MRN:          [id number]  DATE:02/18/2008                            DOB:          May 14, 1941    RETURN OFFICE VISIT   PAST MEDICAL HISTORY:  1. Hyperlipidemia.  2. Coronary artery disease, status post anterior wall MI with a drug-      eluting stent to his LAD, ejection fraction 40%.  3. Benign prostatic hyperplasia.   MEDICATIONS:  1. Carvedilol 3.125 twice daily.  2. Enalapril 2.5 twice daily.  3. Plavix 75 mg daily.  4. Aspirin 325 mg daily.  5. Niaspan 500 mg at bedtime.  6. Simvastatin 40 mg daily.   PHYSICAL EXAMINATION:  VITAL SIGNS:  Weight 183 pounds, blood pressure  110/60, and heart rate 60.   LABORATORY DATA:  LFTs within normal limits.  Total cholesterol 96,  triglyceride 91, HDL 33, and LDL 45.   ASSESSMENT:  Jordan Jordan is a very pleasant gentleman who returns to  lipid clinic today with no chest pain, no shortness of breath, no muscle  aches or pains.  He is compliant with current medication regimen.  After  all of the confusion from last visit, he is now strained out and taking  only simvastatin 40 mg a day and Niaspan 500 mg at bedtime for his lipid  therapy.  His total cholesterol is at goal of less than 200,  triglycerides at goal of less than 150, HDL less than goal of greater  than 40, and LDL at goal of less than 70.  He is compliant with low-fat,  low-carbohydrate diet.  His wife monitors very closely the meals that he  eats.  He does state that he does very little snacking and for the first  time in a year and a half, he had a hamburger with some cheese on it for  February 13, 2008.  He usually eats very lean protein and lots of fruits and  vegetables and high-fiber diet.  He exercises 45 minutes to 60 minutes 4  or 5 days a week.  He does note that he is sleepy and more often in the  afternoon than he had previously  been before.  He does also note  bruising on his legs with small bumps that he did not notice in the  past.  I have assured him that these are not related to the change from  Lipitor to simvastatin generic and that if he continues to have the  similar problems, he should follow up with his primary physician.  I  have commended him on his great adherence to his exercise program and  his low-fat, low-carbohydrate diet.  I have encouraged him to continue  doing so.   PLAN:  1. Continue current medication regimen.  2. Continue low-fat, low-carbohydrate diet.  3. Continue exercise.  4. Followup visit in 6 months for lipid panel and LFTs and we will      make adjustments at that time.      Leota Sauers,  PharmD  Electronically Signed      Jesse Sans. Daleen Squibb, MD, [facility]  Electronically Signed   LC/MedQ  DD: 02/18/2008  DT: 02/19/2008  Job #: [id number]

## 2010-12-25 NOTE — Assessment & Plan Note (Signed)
[facility]                            CARDIOLOGY OFFICE NOTE   NAME:FIELDSKnoxx, [name]                        MRN:          [id number]  DATE:09/10/2006                            DOB:          07/25/1941    PRIMARY CARE PHYSICIAN:  Dr. Alroy Dust.   CLINICAL HISTORY:  [name] is 70 years old and works in Acupuncturist, and also Psychologist, sport and exercise estate.  On January 8, he developed  chest pain while driving in his car and stopped and called EMS.  They  arrived and brought him promptly to [facility].  He fibrillated and  required resuscitation.  I believe this happened en route and in the ED.  He was taken promptly to the cath lab where we performed angiography and  stenting of the LAD with a bare metal stent.  His ejection fraction was  estimated at 40% in the hospital.  He had recurrent pain on day 2 and  was taken back to the lab, but the stent was widely patent.  He also had  a marked hematoma of his left eye and underwent CT scan after his  intervention and was seen by ophthalmology.  His hematoma subsequently  resolved without any further treatment.  He had a followup  echocardiogram in the hospital, which showed an ejection fraction in the  range of 35-40%.   PAST MEDICAL HISTORY:  Significant for ankylosing spondylitis,  hypertension, and abnormal glucose.   CURRENT MEDICATIONS:  Carvedilol, enalapril, Plavix, Lipitor, and  aspirin.   EXAMINATION:  The blood pressure was 110/65, pulse 62 and regular.  There was no venous distension.  Carotid pulses were full without  bruits.  CHEST:  Clear.  HEART:  Rhythm was regular.  I could hear no murmurs or gallops.  ABDOMEN:  Soft with normal bowel sounds.  There is no  hepatosplenomegaly.  Both femoral sites were well-healed.  There is no peripheral edema and peripheral pulses were equal.   An electrocardiogram showed anterior and lateral T wave inversion with Q  waves in V1 and V2,  consistent with a recent anterior infarction.   IMPRESSION:  1. Recent anterior wall myocardial infarction with out of hospital      ventricular fibrillation arrest, treated with root perfusion      therapy with a bare metal stent to the left anterior descending.  2. Ejection fraction 35-40% by echo.  3. Orbital hematoma resolved.  4. Hyperlipidemia.  5. Ankylosing spondylitis.  6. History of hypertension.   RECOMMENDATIONS:  I think [name] is doing well at present.  He is  scheduled to start in rehab next week.  We plan to gradually increase  his activities.  We will get a BMP, CBC, and a BNP on him today.  I will  plan to see him back in 4 to 5 weeks.  I told him that we want to  reevaluate his LV function in about 3 months with either an echo or an  MRCT.  He is interested in the VIA trial and [name] is talking  to  him today about being involved in that.     Bruce Elvera Lennox Juanda Chance, MD, [facility]  Electronically Signed    BRB/MedQ  DD: 09/10/2006  DT: 09/10/2006  Job #: [id number]

## 2010-12-25 NOTE — Assessment & Plan Note (Signed)
[facility]                            CARDIOLOGY OFFICE NOTE   NAME:FIELDSLaval, Cafaro                      MRN:          [id number]  DATE:01/13/2007                            DOB:          03-24-1941    CLINICAL HISTORY:  Mr. 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  He had an out-of-hospital v-fib arrest which occurred at  the time EMS arrived in January of 2008.  He had an anterior wall  infarction and was treated with a drug-eluting stent to the LAD.  He has  done quite well since that time, and his LV function has improved from  35%-40% to normal on last echocardiogram with mild septal hypokinesis.   He has done quite well and has had no recent chest pain, shortness of  breath, or palpitations.  He is part of the VIA trail and scheduled for  a CT angio next week.   PAST MEDICAL HISTORY:  Significant for hypertension, hyperlipidemia,  ankylosing spondylitis, and abnormal glucose.   CURRENT MEDICATIONS:  1. Carvedilol.  2. Enalapril.  3. Plavix.  4. Aspirin.  5. Lipitor.  6. VIA study drug.   PHYSICAL EXAMINATION:  Blood pressure is 100/60 and the pulse is 52 and  regular.  There was no venous distension.  The carotid pulses were full without  bruits.  Chest was clear.  Cardiac rhythm was regular.  He had no murmurs or gallops.  The abdomen was soft without organomegaly.  Peripheral pulses were full and there was no peripheral edema.   IMPRESSION:  1. Coronary artery disease, status post anterior wall myocardial      infarction complicated by out-of-hospital v-fib arrest, treated      with a bare metal stent to the left anterior descending, March,      2008.  2. Ejection fraction 30%-40%, improved to normal by left echo.  3. Orbital hematoma, resolved.  4. Hyperlipidemia with low HDL.  5. Ankylosing spondylitis.  6. History of hypertension.  7. VIA study.   RECOMMENDATIONS:  Mr. Carn is doing quite well.  He  asked about  exercise prescription for his heart rate.  He is currently doing his  resting heart rate plus 40, but he does not feel very stressed at this.  I told him to go up to 50, and we will plan to do a 68-month rest/stress  Myoview scan to assess him for restenosis.  We will be able to give him  some target heart rates based upon this.  I will leave him on his  carvedilol for the study.  I will see him back in 3 months and we will  get a fasting lipid profile, as well as hemoglobin A1c, fasting sugar,  and BNP at that time.  We might consider Niaspan for his low HDL.     Bruce Elvera Lennox Juanda Chance, MD, [facility]  Electronically Signed    BRB/MedQ  DD: 01/13/2007  DT: 01/13/2007  Job #: [id number]

## 2010-12-25 NOTE — Assessment & Plan Note (Signed)
[facility]                               LIPID CLINIC NOTE   NAME:[name], [name]                      MRN:          [id number]  DATE:07/21/2008                            DOB:          1941/04/05    Return office visit for Lipid Clinic.   PAST MEDICAL HISTORY:  1. Hyperlipidemia.  2. Coronary artery disease, status post anterior wall MI with a drug-      eluting stent to his LAD, past ejection fraction of 40.  Most      recent stress test shows ejection fraction of 56.  3. Benign prostatic hyperplasia.   MEDICATIONS:  1. Carvedilol 3.125 mg twice daily.  2. Enalapril 2.5 mg twice daily.  3. Aspirin 325 mg daily.  4. Simvastatin 40 mg daily.  5. Niaspan 1000 mg daily.  6. Aspirin 81 mg daily.   PHYSICAL EXAMINATION:  VITAL SIGNS:  Weight 188 pounds, blood pressure  126/70, and heart rate 60.   LABORATORY DATA:  Total cholesterol 100, triglyceride 46, HDL 39, and  LDL 52.  LFTs within normal limits.   ASSESSMENT:  [name] is a very pleasant gentleman who returns to  Lipid Clinic today with no chest pain, no shortness of breath, no muscle  aches or pains.  He is compliant with current medication regimen.  He is  also very compliant with lifestyle modification.  He states that he eats  a very healthy low-fat meals, oatmeal with blueberries each morning with  his coffee.  He also has a low-fat lunch and his evening meal consists  of vegetable, small amount of starch, and a lean protein that his wife  prepares.  He does state that he has increased his portion sizes  recently as well as, as noted in his increased weight, he does agree  that he will decrease his portion sizes in the upcoming months to help  facilitate weight loss.  I have encouraged him to have a 5-pound weight  loss by the time we see him at his next visit in 4 months.  His total  cholesterol is at goal of less than 200 and triglycerides at goal of  less than 150.  His HDL  is less than goal of greater than 40.  However,  significantly improved since last visit and his LDL is at goal of less  than 70.  He does exercise for 45 minutes to an hour 4-5 days a week.  He says he walks on treadmill for about 30 minutes or at the track at  the gym, then he uses the elliptical machine, the rowing machines, and  some free weights since he says he varies his work out on a daily basis  but make sure that he does keep his heart rate at 45-60 minutes.  He  feels great.  He is very excited to speak of the new addition to their  family.  He has become a grandfather for the first time to a little baby  boy that he cannot stop talking about and is smiling from ear-to-ear.  I  have encouraged him to continue this lifestyle modification to be around  to watch this new grandchild grow up and he does seem to be very  agreeable to our current plan.   PLAN:  Continue Niaspan 500 mg twice daily and simvastatin 40 mg daily  at this time.  We will monitor very closely for any adverse events.  He  will continue his exercise program of 45 minutes to 60 minutes 4-5 days  a week.  Continue low-fat and low-carbohydrate meals, but decreasing  portion sizes.  To encourage weight loss and follow up in 4 months for a  lipid panel and LFTs and make changes at that time.      Leota Sauers, PharmD  Electronically Signed      Jesse Sans. Daleen Squibb, MD, [facility]  Electronically Signed   LC/MedQ  DD: 07/21/2008  DT: 07/22/2008  Job #: [id number]

## 2010-12-28 NOTE — Assessment & Plan Note (Signed)
[facility]                            CARDIOLOGY OFFICE NOTE   NAME:FIELDSJoeph, Szatkowski                        MRN:          [id number]  DATE:10/14/2006                            DOB:          07-01-1941    CLINICAL HISTORY:  Mr. Pullin is 70 years old, and works in [employer].  On January 8th, he developed chest pain and called EMS  from his car, and fibrillated shortly after they arrived.  He was  brought to [facility] with an anterior wall infarction which was  treated with stenting of the LAD.  Mr. Percival has a cath report from  August 19, 2006.  His ejection fraction was estimated at 35%-40% in the  hospital by echocardiography.   He has done quite well since that time.  He has been in cardiac rehab.  He has had no significant chest pain, shortness of breath or  palpitations.   PAST MEDICAL HISTORY:  1. Ankylosing spondylitis.  2. Hypertension.  3. Abnormal glucose.  4. Hyperlipidemia.   CURRENT MEDICATIONS:  1. Carvedilol.  2. Enalapril.  3. Plavix.  4. Lipitor.  5. Aspirin.   PHYSICAL EXAMINATION:  Blood pressure is 102/67, pulse 66 and regular.  There was no venous distension.  Carotid pulses were full without bruits.  Chest was clear.  Cardiac rhythm was regular, had no murmurs or gallops.  The abdomen was soft with normal bowel sounds.  Peripheral pulses were full and there was no peripheral edema.   IMPRESSION:  1. Anterior wall myocardial infarction, complicated by out of hospital      V-fib arrest, treated with bare metal stent to left anterior      descending artery.  2. Ejection fraction 35%-40% by echo.  3. Orbital hematoma, resolved.  4. Hyperlipidemia.  5. Ankylosing spondylitis.  6. History of hypertension.  7. __________ study.   RECOMMENDATIONS:  I think Mr. Klingbeil is doing very well.  Will plan to  continue his current medications.  Will evaluate him with a 2D echo in 3-  4 weeks to reassess his  left ventricular function.  He is part of the  __________ study, and in 3-4 months he will have another CT angio if he  continues in that part of the study.  He asked about flying, and I told  him that would be okay in 2-3 weeks.  He is having some sexual  dysfunction, and I suggested he see Dr. Kriste Basque, but I told him that if he  needs Viagra or the equivalent, that that would not be a problem, as  long as he is not taking nitrates with it.  I will plan to see him back  in 2 months.     Bruce Elvera Lennox Juanda Chance, MD, [facility]  Electronically Signed    BRB/MedQ  DD: 10/14/2006  DT: 10/14/2006  Job #: [id number]

## 2010-12-28 NOTE — Discharge Summary (Signed)
[name], [name]               ACCOUNT NO.:  [id number]   MEDICAL RECORD NO.:  [id number]          PATIENT TYPE:  INP   LOCATION:  2009                         FACILITY:  [facility]   PHYSICIAN:  [name] Beals. Juanda Chance, MD, FACCDATE OF BIRTH:  25-Jan-1941   DATE OF ADMISSION:  08/19/2006  DATE OF DISCHARGE:  08/25/2006                               DISCHARGE SUMMARY   TIME AT DISCHARGE:  Forty-two minutes.   PROCEDURES:  1. Cardiac catheterization.  2. Coronary arteriogram.  3. Left ventriculogram.  4. PTCA and bare metal stent to one vessel.  5. CT of the head without contrast media.  6. Maxillofacial CT without contrast media.   PRIMARY DIAGNOSIS:  Acute anterior myocardial infarction.   SECONDARY DIAGNOSES:  1. Ventricular fibrillation arrest at the time of his myocardial      infarction.  2. Ankylosing spondylitis.  3. Hypertension  4. Hematoma to the right face.  5. Status post bilateral rotator cuff repair.  6. Chronic low back pain.  7. Ischemic cardiomyopathy with an ejection fraction of 40% at      catheterization.  8. Hyperglycemia with a hemoglobin A1c of 5.7.   HOSPITAL COURSE:  [name] is a 70 year old male with no previous  history of coronary artery disease.  He has been seen in the past by Dr.  Everardo Jordan and his only prior medication was an occasional baby aspirin.  On the day of admission, he had sudden onset of substernal chest pain  while he was driving.  EMS was called and he had a V-fib arrest  requiring defibrillation Jordan well Jordan epinephrine, lidocaine, amiodarone.  There was also a possibility of a fall.  In the emergency room, he had  recurrent ventricular fibrillation and was taken urgently to the cath  lab.   In the cath lab, his LAD was totaled proximally and this stenosis was  reduced to zero with a bare metal stent.  It was single vessel disease.  His EF was approximately 40%.   He had an orbital hematoma and a CT of his head and face were  ordered  which showed no acute intracranial pathology.  There was no evidence of  facial bone fracture.  There was hemorrhage over the inferior left  orbital rim and over the zygoma Jordan well Jordan a small amount of hemorrhage  up to 6-mm within the left orbit.  There was hemorrhage into the  intercornual fat and inferior and lateral rectus muscles.  This has  improved and his hemoglobin and hematocrit have remained stable.   [name] is a nonsmoker.  He was seen by cardiac rehab and ambulated  well with them.  He had a re-look catheterization on August 21, 2006,  because of residual chest pain which had worsened.  There was a slight  haziness within the stent but it was not causing any reduction in blood  flow.  There was TIMI 3 flow.  The anterior wall had some motion and was  improved from the previous catheterization on ventriculogram but no EF  was listed.  This can be followed Jordan an  outpatient.   [name] was started on a low dose of a beta blocker and an ACE  inhibitor and he tolerated these well.  A lipid profile was performed  which showed a total cholesterol of 137, triglycerides 73, HDL 44, LDL  78.  He is currently on Lipitor 80 mg a day and this can be adjusted Jordan  an outpatient.  H.  Pylori study was done and was negative at less than  0.4.  He had abnormal liver functions upon admission but these were  trending down and improving prior to discharge.   By August 25, 2006, [name] was tolerating ambulation and his  medications.  Dr. Juanda Jordan evaluated him and felt him stable for discharge  with outpatient followup arranged.   DISCHARGE INSTRUCTIONS:  1. His activity level is to be increased gradually per cardiac rehab      guidelines.  2. He is to call our office for any problems with the cath site.  3. He is to follow up with Dr. Juanda Jordan on January 30th at 3:45, and      with Dr. Kriste Jordan Jordan needed.   DISCHARGE MEDICATIONS:  1. Aspirin 325 mg daily.  2. Plavix 75 mg  daily.  3. Nitroglycerin sublingual p.r.n.  4. Coreg 3.125 mg, one tab b.i.d.  5. Enalapril 2.5 mg b.i.d.  6. Lipitor 80 mg q.h.s.      [name] Demark, PA-C      [name] R. Juanda Chance, MD, [facility]  Electronically Signed    RB/MEDQ  D:  08/25/2006  T:  08/25/2006  Job:  [id number]   cc:   [name] Cloud. [name] Basque, MD

## 2010-12-28 NOTE — H&P (Signed)
NAMECYRUSS, ARATA               ACCOUNT NO.:  [id number]   MEDICAL RECORD NO.:  [id number]          PATIENT TYPE:  INP   LOCATION:  2910                         FACILITY:  [facility]   PHYSICIAN:  Madolyn Frieze. Jens Som, MD, FACCDATE OF BIRTH:  [birth year]   DATE OF ADMISSION:  08/19/2006  DATE OF DISCHARGE:                              HISTORY & PHYSICAL   HISTORY OF PRESENT ILLNESS:  Mr. Riera is a 70 year old male with no  significant past medical history (although patient in distress at the  time of evaluation and a poor historian due to his distress) who was  admitted with an acute anterior myocardial infarction.  The patient  apparently was driving down the road today and developed substernal  chest pain, as well as shortness of breath.  He called EMS with his cell  phone.  When they arrived at the scene, the patient suffered a  ventricular fibrillation arrest.  This required multiple  defibrillations, epinephrine, lidocaine and amiodarone..  There is a  question of whether the patient hit his head at the time of the  defibrillation.  A followup electrocardiogram revealed anterior ST  elevation.  He was brought to the emergency room and subsequently to the  cath lab in extremis.  He is complaining of severe chest pain, but is  otherwise not cooperative with questions.   ALLERGIES:  HE DOES DENIY ANY DRUG ALLERGIES.   MEDICATIONS:  He apparently takes no medications per EMS.   FAMILY HISTORY:  Cannot be obtained at this time due to his distress.   PAST MEDICAL HISTORY:  Cannot be obtained at this time due to his  distress.   SOCIAL HISTORY:  Cannot be obtained at this time due to his distress.   PHYSICAL EXAMINATION:  VITAL SIGNS:  His initial blood pressure is 96/66  and his pulse is 97.  GENERAL:  He is well-developed and well-nourished, in severe distress.  He is complaining of substernal chest pain.  SKIN:  His skin is diaphoretic and cool.  HEENT:  His HEENT is remarkable  for a large hematoma over the left eye.  NECK:  His neck is supple with a normal upstroke and I cannot appreciate  bruits.  CHEST:  His chest is clear to auscultation anteriorly.  CARDIOVASCULAR:  His cardiovascular exam reveals a regular rate and  rhythm.  I cannot appreciate murmurs, rubs, or gallops, although his  heart sounds are distant.  His abdominal exam shows no tenderness to  palpation.  There are no masses palpated and there is no hepatomegaly  noted.  There is no bruit.  He has 2+ femoral pulses bilaterally.  EXTREMITIES:  His extremities show no edema and there are no cords  palpated.  His distal pulses are diminished.  NEUROLOGIC:  His neurologic exam cannot be assessed at the time of the  evaluation, although he does move all extremities.  The remaining  physical exam cannot be obtained.  The patient is on the cath lab table  being prepped for his procedure.   LABORATORY DATA:  His electrocardiogram shows a sinus rhythm with  anterior  ST elevation and inferior ST depression.   DIAGNOSES:  1. Acute anterior myocardial infarction.  2. Large hematoma over the left eye, possibly trauma at the time of      his defibrillation.   PLAN:  Mr. Roper presents with an acute anterior myocardial infarction.  We will proceed with emergent cardiac catheterization/PCI.  He will need  aspirin, Plavix, ACE inhibitor, beta blocker and Statin.  We will review  his remaining history and past medical history when he improves from his  present condition.  We will plan to proceed with a head CT after PCI.  We feel that delaying his PCI would subject him to too much risk for  proceeding with the CT prior to the procedure.  This appears to be most  likely a superficial hematoma.      Madolyn Frieze Jens Som, MD, [facility]  Electronically Signed     BSC/MEDQ  D:  08/19/2006  T:  08/20/2006  Job:  [id number]

## 2010-12-28 NOTE — Cardiovascular Report (Signed)
[name], [name]               ACCOUNT NO.:  [id number]   MEDICAL RECORD NO.:  [id number]          PATIENT TYPE:  INP   LOCATION:  2910                         FACILITY:  [facility]   PHYSICIAN:  Arturo Morton. Riley Kill, MD, FACCDATE OF BIRTH:  03-Jan-1941   DATE OF PROCEDURE:  08/21/2006  DATE OF DISCHARGE:                            CARDIAC CATHETERIZATION   INDICATIONS:  [name] is a 70 year old gentleman who underwent  myocardial re-perfusion by a primary stenting of an acute anterior wall  myocardial infarction.  He also had out of hospital cardiac arrest with  resuscitation.  He has also developed a soft-tissue hemorrhage over the  inferior left orbital rim.  He has stabilized but this morning developed  some recurrent chest discomfort and has some anterior T-wave changes.  He was seen by Dr. Juanda Chance, brought to the catheterization laboratory  emergently.  An urgent echocardiogram was done in the laboratory to  exclude the myocardial rupture.  No significant effusion was noted, and  there was findings consistent with a recent anterior wall infarction.   PROCEDURE:  1. Left heart catheterization.  2. Selective coronary arteriography.  3. Selective left ventriculography.   DESCRIPTION OF PROCEDURE:  The patient was brought to the  catheterization laboratory.  Fentanyl was given with gradual relief of  chest discomfort.  Through an anterior puncture, the left femoral artery  was entered.  A 5-French sheath was placed.  We took multiple views of  the left coronary artery.  No views of the right coronary artery were  obtained.  Following this, a pigtail catheter was placed in the central  aorta and then left ventricle.  Ventriculography was done in the RAO  projection.  The pigtail was subsequently removed, and the patient was  taken to the holding area for direct manual compression.  He tolerated  the procedure without complication.   HEMODYNAMIC DATA:  1. Central aortic pressure  97/60 with a mean of 77.  2. Left ventricular pressure 111/17.  3. No gradient or pullback across the aortic valve.   ANGIOGRAPHIC DATA:  1. Ventriculography was done in the RAO projection.  Compared to the      previous study, the anterolateral wall demonstrates some motion and      is improved from the acute study.  There is less hyperdynamic      function of the other walls as well.  2. The left main coronary artery is free of critical disease.  3. The circumflex as on the previous study is widely patent without      significant focal narrowing.  4. The LAD is a stented artery.  There was diffuse changes within the      first diagonal which exits from the stent site.  This probably      approaches between 50 and 70% luminal reduction throughout,      although there is excellent flow down into the diagonal system.      The stent itself is patent.  There is a small layering effect over      the inferior aspect of the stent, suggesting possibly a  lower clot      in the area of the infarct zone.  However, there is TIMI III flow      down the vessel, with good, excellent flow to the apex.  There is      no hangup or delay.   CONCLUSION:  1. Compared to the previous study, there is some motion now on the      anteroapical segment.  2. The stent remains patent, with albeit a small layering affect over      the inferior aspect, although no significant focal narrowing, and      excellent TIMI III flow.  3. Segmental disease of the diagonal, extending from the ostium out to      the bifurcation.   DISPOSITION:  I plan to review the films with Dr. Juanda Chance, but at the  present time continued medical therapy will be warranted.  He will  remain on anti-platelet treatment.      Arturo Morton. Riley Kill, MD, [facility]  Electronically Signed     TDS/MEDQ  D:  08/21/2006  T:  08/21/2006  Job:  [id number]   cc:   Everardo Beals. Juanda Chance, MD, [facility]  Jonita Albee, M.D.  Sean A. Everardo All, MD  Patient's Medical  Record

## 2010-12-28 NOTE — H&P (Signed)
NAME:  [name], [name]                         ACCOUNT NO.:  [id number]   MEDICAL RECORD NO.:  [id number]                   PATIENT TYPE:  INP   LOCATION:  0105                                 FACILITY:  [facility]   PHYSICIAN:  Sean A. Everardo All, M.D. [facility]           DATE OF BIRTH:  1941-06-28   DATE OF ADMISSION:  09/26/2002  DATE OF DISCHARGE:                                HISTORY & PHYSICAL   REASON FOR ADMISSION:  Chest pain.   HISTORY OF PRESENT ILLNESS:  The patient is a 70 year old man with four  hours of moderate tightness quality, pain about the left shoulder and left  upper chest.  It is uncertain if these is an exertional component to the  pain.  No associated symptoms.  It resolved with oxygen in the emergency  room.   PAST MEDICAL HISTORY:  Recently diagnosed with hypertension.  Negative for  dyslipidemia and diabetes.   MEDICATIONS:  Aspirin one daily.   SOCIAL HISTORY:  The patient is married.  He works in [employer], [employer].  He is a  nonsmoker.  He rarely consumes alcohol.   FAMILY HISTORY:  Negative for heart disease.   REVIEW OF SYSTEMS:  Denies fever, chills, nausea, vomiting, shortness of  breath, headache, loss of consciousness, skin rash, rectal bleeding,  hematuria, sore throat, blurry vision, dysuria, and cough.   PHYSICAL EXAMINATION:  VITAL SIGNS:  Blood pressure 178/90, heart rate 75,  respiratory rate 20, temperature 98.9.  GENERAL:  In no distress.  SKIN:  Not diaphoretic.  HEENT:  Head is atraumatic.  Sclerae nonicteric.  Pharynx is clear.  NECK:  Supple.  CHEST:  Clear to auscultation.  The chest wall is nontender.  There is no  respiratory distress.  CARDIOVASCULAR:  No JVD, no edema.  Regular rate and rhythm.  No murmur.  Carotid arteries - no bruit.  ABDOMEN:  Soft, nontender.  No hepatosplenomegaly.  No masses.  GENITAL/RECTAL EXAM:  Not done at this time due to patient's condition.  EXTREMITIES:  The patient moves all fours.  NEUROLOGIC:  Alert,  well-oriented.  Does not appear anxious nor depressed.  Cranial nerves appear to be intact.  SKIN:  Not diaphoretic.   Electrocardiogram normal except for question of QS complex in V1.   IMPRESSION:  1. Chest pain of uncertain etiology.  2. Recently diagnosed with hypertension.   PLAN:  1. I discussed with the patient the risk to his health of leaving the     emergency room against medical advice.  I told him that he faces     unnecessary risk of death or disability, and he agrees to hospital     admission.  2. Nitroglycerin p.r.n.  3. Beta blockade.  4. Check CPKs.  5. Continue aspirin.  Sean A. Everardo All, M.D. [facility]    SAE/MEDQ  D:  09/27/2002  T:  09/27/2002  Job:  [id number]

## 2011-01-20 ENCOUNTER — Other Ambulatory Visit: Payer: Self-pay | Admitting: Cardiology

## 2011-05-20 ENCOUNTER — Ambulatory Visit (INDEPENDENT_AMBULATORY_CARE_PROVIDER_SITE_OTHER): Payer: Medicare Other | Admitting: *Deleted

## 2011-05-20 DIAGNOSIS — E7800 Pure hypercholesterolemia, unspecified: Secondary | ICD-10-CM

## 2011-05-20 LAB — HEPATIC FUNCTION PANEL
ALT: 39 U/L (ref 0–53)
Albumin: 4.2 g/dL (ref 3.5–5.2)
Total Bilirubin: 1 mg/dL (ref 0.3–1.2)
Total Protein: 6.8 g/dL (ref 6.0–8.3)

## 2011-05-20 LAB — LIPID PANEL
Cholesterol: 117 mg/dL (ref 0–200)
HDL: 40.8 mg/dL (ref >39.00)
Triglycerides: 85 mg/dL (ref 0.0–149.0)
VLDL: 17 mg/dL (ref 0.0–40.0)

## 2011-05-23 ENCOUNTER — Ambulatory Visit: Payer: BLUE CROSS/BLUE SHIELD

## 2011-06-03 ENCOUNTER — Encounter: Payer: Self-pay | Admitting: Pulmonary Disease

## 2011-06-04 ENCOUNTER — Encounter: Payer: Self-pay | Admitting: Pulmonary Disease

## 2011-06-04 ENCOUNTER — Other Ambulatory Visit (INDEPENDENT_AMBULATORY_CARE_PROVIDER_SITE_OTHER): Payer: Medicare Other

## 2011-06-04 ENCOUNTER — Ambulatory Visit (INDEPENDENT_AMBULATORY_CARE_PROVIDER_SITE_OTHER): Payer: Medicare Other | Admitting: Pulmonary Disease

## 2011-06-04 DIAGNOSIS — D126 Benign neoplasm of colon, unspecified: Secondary | ICD-10-CM

## 2011-06-04 DIAGNOSIS — F419 Anxiety disorder, unspecified: Secondary | ICD-10-CM

## 2011-06-04 DIAGNOSIS — F411 Generalized anxiety disorder: Secondary | ICD-10-CM

## 2011-06-04 DIAGNOSIS — I2510 Atherosclerotic heart disease of native coronary artery without angina pectoris: Secondary | ICD-10-CM

## 2011-06-04 DIAGNOSIS — M459 Ankylosing spondylitis of unspecified sites in spine: Secondary | ICD-10-CM

## 2011-06-04 DIAGNOSIS — E7800 Pure hypercholesterolemia, unspecified: Secondary | ICD-10-CM

## 2011-06-04 DIAGNOSIS — Z23 Encounter for immunization: Secondary | ICD-10-CM

## 2011-06-04 DIAGNOSIS — N139 Obstructive and reflux uropathy, unspecified: Secondary | ICD-10-CM

## 2011-06-04 DIAGNOSIS — M6740 Ganglion, unspecified site: Secondary | ICD-10-CM

## 2011-06-04 LAB — URINALYSIS
Bilirubin Urine: NEGATIVE
Hgb urine dipstick: NEGATIVE
Ketones, ur: NEGATIVE
Nitrite: NEGATIVE
Specific Gravity, Urine: 1.025 (ref 1.000–1.030)
Urine Glucose: 100
pH: 6 (ref 5.0–8.0)

## 2011-06-04 LAB — CBC WITH DIFFERENTIAL/PLATELET
Eosinophils Relative: 0.2 % (ref 0.0–5.0)
HCT: 40.6 % (ref 39.0–52.0)
Hemoglobin: 14.1 g/dL (ref 13.0–17.0)
Lymphocytes Relative: 28.8 % (ref 12.0–46.0)
Lymphs Abs: 1.2 10*3/uL (ref 0.7–4.0)
Monocytes Relative: 19.4 % — ABNORMAL HIGH (ref 3.0–12.0)
Neutro Abs: 2.2 10*3/uL (ref 1.4–7.7)
WBC: 4.3 10*3/uL — ABNORMAL LOW (ref 4.5–10.5)

## 2011-06-04 LAB — BASIC METABOLIC PANEL
Calcium: 9.4 mg/dL (ref 8.4–10.5)
GFR: 85.88 mL/min (ref >60.00)
Potassium: 4 mEq/L (ref 3.5–5.1)
Sodium: 142 mEq/L (ref 135–145)

## 2011-06-04 LAB — TSH: TSH: 2.05 u[IU]/mL (ref 0.35–5.50)

## 2011-06-04 MED ORDER — TETANUS-DIPHTH-ACELL PERTUSSIS 5-2.5-18.5 LF-MCG/0.5 IM SUSP: 0.5000 mL | Freq: Once | INTRAMUSCULAR | Status: DC

## 2011-06-04 NOTE — Patient Instructions (Signed)
Today we updated your med list in EPIC...    Continue your current meds the same...  We reviewed your recent Fasting lipid profile (it looked great!)...  Today we did the rest of your follow up blood work...    Please call the PHONE TREE in a few days for your results...    Dial [id number] & when prompted enter your patient number followed by the # symbol...    Your patient number is:  [id number]  We will arrange for a Cariology follow up w/ DrCooper...  We gave you the combination Tetanus vaccine called the TDAP today (this should be good for 72yrs)  You should also get the 2012 Flu vaccine, & the nurse is looking into the option for getting it cheaper at [facility] or [facility]...  Call for any questions...  Let's plan a follow up visit next yr, & sooner if needed for problems.Marland KitchenMarland Kitchen

## 2011-06-04 NOTE — Progress Notes (Signed)
Subjective:    Patient ID: [name], male    DOB: [birth year], 70 y.o.   MRN: [id number]  HPI 70 y/o WM here for an ROV... he has mult medical problems & is followed regularly by DrBBrodie for Cards w/ hx of CAD, s/p AWMI w/ VFib arrest, subseq PTCA/ stent 1/08... he also has Hypercholesterolemia & followed in the [facility] on Simvastatin & Niaspan...  ~  December 05, 2009:  he was hosp 2/11 w/ viral gastroenteritis & improved w/ fluids... 3/11 he noted cough, "tickle in throat", clear sputum, & cough worse at night... the gastroent was assoc w/ N&V as well & he felt that this "burned my esoph"> Rx OTC PPI vs H2 blockers & antireflux regimen... he was also on an ACE inhibitor- Enalapril 2.5mg  Bid & this was stopped, w/ Benicar 20mg - 1/2 tab daily substit...     He reports that cough has resolved off the Enalapril w/ the aide of antireflux Rx & he feels that he is back to baseline... he feels sl weak, low energy & we note low BP today 100/54> therefore rec to stop the Benicar & follow BP at home...  NOTE: I last saw pt 7/08 before our EMR> see prob list below:  ~  June 04, 2010:  6 month ROV doing well- no new complaints or concerns... he saw DrBrodie 8/11- stable & no changes made... also followed in the [facility]id Clinic w/ labs done 9/11 & improved- Niaspan decr to just one daily... Pulm is stable w/o cough etc;  GI is followed in [place] & up to date;  Ortho probles addressed by DrMurphy & Deveshwar- on Celebrex & OTC anti-inflamm meds Prn... he had the Pneumovax last yr age 53, & already had the 2011 Flu shot.  ~  December 04, 2010:  41mo ROV doing well w/o new complaints or concerns;  Followed in the Lipid clinic & notes reviewed- they stopped his Niaspan recently;  He last saw DrBBrodie 2011 & stable- awaiting appt w/ DrCooper;  We reviewed his fasting blood work 10/11> see below...  ~  June 04, 2011:  41mo ROV & he is c/o arthritis pains off & on, using OTC analgesics w/ improvement & it's  tolerable; still followed in the Lipid Clinic & taking Simva40- 1/2 tab daily w/ recent FLP looking good;  Known CAD, AWMI, etc & awaiting appt w/ DrCooper set up for 07/01/11> pt denies CP, palpit, SOB, or edema;  He has DJD, ankylosing spondy, hx bilat rotator cuff repairs & ganglion on left wrist> he is using a TENS unit he bought for himself on-line, plus OTC analgesics as needed...         Problem List:    ALLERGIC RHINITIS (ICD-477.9) - hx AR w/ +allergy testing to ragweed & dust in the past.  COUGH (ICD-786.2) - eval 3/11 for dry irritative cough, on Enalapril, & cough resolved off ACE Rx.  Hx of ABNORMAL CHEST XRAY (ICD-793.1) - hx mild basilar pulm fibrosis on old CXR/ CT scan in 2002... there is also mild nonprogressive elevation of the right hemidiaphragm on old films...  CAD, NATIVE VESSEL (ICD-414.01) - he had an AWMI 1/08 w/ VFib arrest, successful resus, urgent cath w/ PTCA/ stent to LAD by DrBBrodie... on ASA 81mg /d, & CARVEDILOL 3.125mg Bid... prev Enalapril stopped due to cough & switched to Benicar> pt asked to stop Benicar due to BP= 100/54, weak/ no energy etc...  denies CP, palpit, syncope, dyspnea, swelling, etc... ~  2DEcho 3/08 showed mild  septal HK, w/ LVF at lower limit of norm. ~  Myoview 8/09 showed ? sm inferior MI vs diaph attenuation, no ischemia, EF= 56%.  HYPERCHOLESTEROLEMIA (ICD-272.0) - followed in the Lipid Clinic on SIMVASTATIN 40mg - 1/2 tab daily & NIASPAN 500mg - 2/d... ~  FLP 10/10 on Simva40+Niasp1000 showed TChol 94, TG 44, HDL 36, LDL 49... keep same Rx. ~  FLP 4/11 on Simva40+Niasp1000 showed TChol 61, TG 44, LDL 28, LDL 25... pt rec to decr to Simva20. ~  FLP 9/11 on Simva20+Niasp1000 showed TChol 103, TG 60, HDL 35, LDL 56... Lip[facility]inic decr the Niaspan to 1/d. ~  4/12:  Lipid Clinic discontinued his Niacin Rx...  COLONIC POLYPS (ICD-211.3) ~  colonoscopy 11/02 by Dorris Singh showed divertics, hems, otherw neg. ~  colonoscopy in [place] DrPeters 10/07 w/ 1  hyperplastic polyp removed> f/u planned 60yrs.  HEMORRHOIDS (ICD-455.6) - he reports hemorrhoid surg 10/10 in [place]...  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  Hx of OTHER DISORDER OF MUSCLE LIGAMENT AND FASCIA (ICD-728.89) - he had a spont hemorrhage into his right thigh 1996  ANXIETY (ICD-300.00) - his son was killed in 1998  Hx of MALARIA (ICD-084.6) - he has malaria while in the service  HEALTH MAINTENANCE: ~  GI:  followed by DrPeters in [place] & up to date on colonoscopies... ~  GU:  exam is neg and PSA= 2.64 ~  Immuniz:  he gets the Flu vaccine each Fall... had PNEUMOVAX in 2010 at age 61.   Past Surgical History  Procedure Date  . Rotator cuff repair     bilat by Dr. Madelon Lips  . Hemorrhoid surgery 10/10    in [place]    Outpatient Encounter Prescriptions as of 06/04/2011  Medication Sig Dispense Refill  . aspirin 81 MG EC tablet Take 81 mg by mouth daily.        . carvedilol (COREG) 3.125 MG tablet TAKE ONE TABLET BY MOUTH TWICE DAILY WITH MEALS  60 tablet  12  . simvastatin (ZOCOR) 40 MG tablet Take 20 mg by mouth at bedtime.       [name] DISCONTD: niacin (NIASPAN) 500 MG CR tablet Take 500 mg by mouth at bedtime.          No Known Allergies   Current Medications, Allergies, Past Medical History, Past Surgical History, Family History, and Social History were reviewed in [employer] record.     Review of Systems    The patient denies fever, chills, sweats, anorexia, fatigue, weakness, malaise, weight loss, sleep disorder, blurring, diplopia, eye irritation, eye discharge, vision loss, eye pain, photophobia, earache, ear discharge, tinnitus,  decreased hearing, nasal congestion, nosebleeds, sore throat, hoarseness, chest pain, palpitations, syncope, dyspnea on exertion, orthopnea, PND, peripheral edema, cough, dyspnea at rest, excessive sputum, hemoptysis, wheezing, pleurisy, nausea, vomiting, diarrhea, constipation, change in bowel habits, abdominal pain, melena, hematochezia, jaundice, gas/bloating, indigestion/heartburn, dysphagia, odynophagia, dysuria, hematuria, urinary frequency, urinary hesitancy, nocturia, incontinence, back pain, joint pain, joint swelling, muscle cramps, muscle weakness, stiffness, arthritis, sciatica, restless legs, leg pain at night, leg pain with exertion, rash, itching, dryness, suspicious lesions, paralysis, paresthesias, seizures, tremors, vertigo, transient blindness, frequent falls, frequent headaches, difficulty walking, depression, anxiety, memory  loss, confusion, cold intolerance, heat intolerance, polydipsia, polyphagia, polyuria, unusual weight change, abnormal bruising, bleeding, enlarged lymph nodes, urticaria, allergic rash, hay fever, and recurrent infections.     Objective:   Physical Exam      WD, WN, 70 y/o WM in NAD... GENERAL:  Alert & oriented; pleasant & cooperative... HEENT:  [place]/AT, EOM-wnl, PERRLA, EACs-clear, TMs-wnl, NOSE-clear, THROAT-clear & wnl. NECK:  Supple w/ fairROM; no JVD; normal carotid impulses w/o bruits; no thyromegaly or nodules palpated; no lymphadenopathy. CHEST:  Clear to P & A; without wheezes/ rales/ or rhonchi. HEART:  Regular Rhythm; without murmurs/ rubs/ or gallops. ABDOMEN:  Soft & nontender; normal bowel sounds; no organomegaly or masses detected. RECTAL:  prostate 3+ smooth w/o nodules, rectal neg- stool heme neg. EXT: without deformities, mild arthritic changes; no varicose veins/ venous insuffic/ or edema. NEURO:  CN's intact; motor testing normal; sensory testing normal; gait normal & balance OK. DERM:  No lesions noted; no rash etc...   Assessment &  Plan:   CAD>  followed by DrBB & stable on ASA, BBlocker, Statin;  Continue Rx + diet & exercise...  CHOL>  Followed in the Lipid Clinic & they stopped his Niacin recently, continue Sima40...  GI> stable & up to date...  ORTHO>  Hx AS, etc> he uses Tylenol for pain & avoids NSAIDs as he thinks the Celebrex may have caused his MI...  Other medical issues as noted.Marland KitchenMarland Kitchen

## 2011-06-16 ENCOUNTER — Encounter: Payer: Self-pay | Admitting: Pulmonary Disease

## 2011-07-01 ENCOUNTER — Ambulatory Visit (INDEPENDENT_AMBULATORY_CARE_PROVIDER_SITE_OTHER): Payer: Medicare Other | Admitting: Cardiovascular Disease

## 2011-07-01 ENCOUNTER — Encounter: Payer: Self-pay | Admitting: Cardiovascular Disease

## 2011-07-01 VITALS — BP 140/66 | HR 59 | Ht 67.0 in | Wt 192.0 lb

## 2011-07-01 DIAGNOSIS — E7800 Pure hypercholesterolemia, unspecified: Secondary | ICD-10-CM

## 2011-07-01 DIAGNOSIS — I2510 Atherosclerotic heart disease of native coronary artery without angina pectoris: Secondary | ICD-10-CM

## 2011-07-01 NOTE — Patient Instructions (Signed)
Your physician has requested that you have an exercise tolerance test in 1 YEAR. For further information please visit [email or web address] Please also follow instruction sheet, as given.  Your physician recommends that you continue on your current medications as directed. Please refer to the Current Medication list given to you today.

## 2011-07-16 NOTE — Assessment & Plan Note (Signed)
Recent lipids were reviewed and an excellent with a cholesterol of 117, LDL 59, and HDL 41. The patient is on simvastatin.

## 2011-07-16 NOTE — Assessment & Plan Note (Signed)
The patient is stable without anginal symptoms. He is on appropriate medical therapy with aspirin, low-dose beta blocker, and statin drug. Plan on exercise treadmill study when he follows up next year.

## 2011-07-16 NOTE — Progress Notes (Signed)
HPI:  This is a 70 year old gentleman presenting for followup of coronary artery disease. The patient presented in 2008 with an anterior wall MI complicated by out of hospital ventricular fib arrest. He was resuscitated and underwent primary PCI with a bare-metal stent in the LAD. The patient post MI ejection fraction has improved to 53% based on his last nuclear study. The patient feels well he denies chest pain, dyspnea, palpitations, lightheadedness, edema, or syncope. He notes that he was diagnosed with elevated blood glucose by his primary care physician recently. He complains of tinnitus for about 6 months. He notes that his blood pressure usually runs in the range of about 115/65 at home.  Outpatient Encounter Prescriptions as of 07/01/2011  Medication Sig Dispense Refill  . amoxicillin (AMOXIL) 875 MG tablet Take 1 tablet by mouth Twice daily.      [name] aspirin 81 MG EC tablet Take 81 mg by mouth daily.        . carvedilol (COREG) 3.125 MG tablet TAKE ONE TABLET BY MOUTH TWICE DAILY WITH MEALS  60 tablet  12  . simvastatin (ZOCOR) 40 MG tablet Take 20 mg by mouth at bedtime.       [name] DISCONTD: TDaP (BOOSTRIX) injection 0.5 mL         No Known Allergies  Past Medical History  Diagnosis Date  . Coronary atherosclerosis of native coronary artery   . Hyperlipidemia     with low HDL  . Ankylosing spondylitis   . Hypertension   . Allergic rhinitis   . Cough   . Abnormal chest x-ray   . Colonic polyp   . Hemorrhoids   . Rotator cuff tear   . Ganglion cyst of wrist     left  . Other disorder of muscle, ligament, and fascia   . Anxiety   . History of malaria     ROS: Negative except as per HPI  BP 140/66  Pulse 59  Ht 5\' 7"  (1.702 m)  Wt 87.091 kg (192 lb)  BMI 30.07 kg/m2  PHYSICAL EXAM: Pt is alert and oriented, NAD HEENT: normal Neck: JVP - normal, carotids 2+= without bruits Lungs: CTA bilaterally CV: RRR without murmur or gallop Abd: soft, NT, Positive BS, no  hepatomegaly Ext: no C/C/E, distal pulses intact and equal Skin: warm/dry no rash  EKG:  Normal sinus rhythm at 59 beats per minute, age-indeterminate septal infarction, otherwise within normal limits.  ASSESSMENT AND PLAN:

## 2011-07-23 ENCOUNTER — Other Ambulatory Visit: Payer: Self-pay

## 2011-07-23 MED ORDER — SIMVASTATIN 40 MG PO TABS: 20.0000 mg | ORAL_TABLET | Freq: Every day | ORAL | Status: DC

## 2011-07-23 NOTE — Telephone Encounter (Signed)
.   Requested Prescriptions   Signed Prescriptions Disp Refills  . simvastatin (ZOCOR) 40 MG tablet 30 tablet 11    Sig: Take 0.5 tablets (20 mg total) by mouth at bedtime.    Authorizing Provider: Tonny Bollman D    Ordering User: Lacie Scotts   E-SCRIBE TO [id number].

## 2011-08-27 ENCOUNTER — Telehealth: Payer: Self-pay | Admitting: *Deleted

## 2011-08-27 DIAGNOSIS — E7800 Pure hypercholesterolemia, unspecified: Secondary | ICD-10-CM

## 2011-08-27 NOTE — Telephone Encounter (Addendum)
Pt faxed over letter to [facility] about how he has been checking his blood sugars at home.  Range is 110-138. Looks like mild DM.  Needs low carb diet, no sugars or sweets, avoid pasta, breads, etc.  Work on weight reduction.  Will need follow up fasting labs in feb.  To include  Lip, bmp. a1c .  These have been placed in the computer and pt is aware.   Will mail a diabetic diet to the pt.

## 2011-09-05 ENCOUNTER — Telehealth: Payer: Self-pay | Admitting: Pulmonary Disease

## 2011-09-05 NOTE — Telephone Encounter (Signed)
I spoke with pt and advised him of SN response. He voiced his understanding and needed nothing further

## 2011-09-05 NOTE — Telephone Encounter (Signed)
Per SN---not sure of the costs of the systems.  Meters are usually free.  The strips are usually 50 cent each.  He may want to check with the pharmacy to see which one would work better for him since his insurance will not cover the meter without the dx of DM.  thanks

## 2011-09-05 NOTE — Telephone Encounter (Signed)
Pt says that he has a prescription for the One Touch Ultra Blue system (given by another physician) and [facility] will not continue filling this because he does not have the diagnosis of 250.00. I explained that [employer] will not pay for this if he has not been diagnosed with diabetes and that he may have to pay for everything out of pocket if he wants to continue monitoring his blood sugars daily at home. He wants to know if SN would recommend another glucose monitoring system that may be cheaper than what he currently has. Pls advise.No Known Allergies

## 2011-09-26 ENCOUNTER — Ambulatory Visit: Payer: Medicare Other | Admitting: Pulmonary Disease

## 2011-10-09 ENCOUNTER — Other Ambulatory Visit (INDEPENDENT_AMBULATORY_CARE_PROVIDER_SITE_OTHER): Payer: Medicare Other

## 2011-10-09 DIAGNOSIS — R7309 Other abnormal glucose: Secondary | ICD-10-CM | POA: Diagnosis not present

## 2011-10-09 DIAGNOSIS — E7800 Pure hypercholesterolemia, unspecified: Secondary | ICD-10-CM

## 2011-10-09 LAB — LIPID PANEL
HDL: 41.6 mg/dL (ref >39.00)
Total CHOL/HDL Ratio: 3
VLDL: 12.6 mg/dL (ref 0.0–40.0)

## 2011-10-09 LAB — BASIC METABOLIC PANEL
CO2: 28 mEq/L (ref 19–32)
Calcium: 9.5 mg/dL (ref 8.4–10.5)
GFR: 86.87 mL/min (ref >60.00)
Sodium: 142 mEq/L (ref 135–145)

## 2012-01-18 ENCOUNTER — Ambulatory Visit (INDEPENDENT_AMBULATORY_CARE_PROVIDER_SITE_OTHER): Payer: Medicare Other | Admitting: Internal Medicine

## 2012-01-18 VITALS — BP 129/72 | HR 46 | Temp 98.4°F | Resp 18 | Ht 66.0 in | Wt 192.0 lb

## 2012-01-18 DIAGNOSIS — L299 Pruritus, unspecified: Secondary | ICD-10-CM | POA: Diagnosis not present

## 2012-01-18 DIAGNOSIS — Z7189 Other specified counseling: Secondary | ICD-10-CM | POA: Diagnosis not present

## 2012-01-18 DIAGNOSIS — B353 Tinea pedis: Secondary | ICD-10-CM

## 2012-01-18 MED ORDER — ECONAZOLE NITRATE 1 % EX CREA: TOPICAL_CREAM | Freq: Every day | CUTANEOUS | Status: DC

## 2012-01-18 MED ORDER — TERBINAFINE HCL 250 MG PO TABS: 250.0000 mg | ORAL_TABLET | Freq: Every day | ORAL | Status: DC

## 2012-01-18 NOTE — Progress Notes (Signed)
  Subjective:    Patient ID: [name], male    DOB: 10/11/1940, 71 y.o.   MRN: [id number]  HPI  Has itchy , scaly skin between toes on R foot only  Review of Systems    no change Objective:   Physical Exam Scaly skin between toes r foot       Assessment & Plan:  Tinea pedis Econazole cream qd and terbinafine 250mg  qd 21 d   SED

## 2012-01-18 NOTE — Patient Instructions (Signed)
Athlete's Foot Athlete's foot (tinea pedis) is a fungal infection of the skin on the feet. It often occurs on the skin between the toes or underneath the toes. It can also occur on the soles of the feet. Athlete's foot is more likely to occur in hot, humid weather. Not washing your feet or changing your socks often enough can contribute to athlete's foot. The infection can spread from person to person (contagious). CAUSES Athlete's foot is caused by a fungus. This fungus thrives in warm, moist places. Most people get athlete's foot by sharing shower stalls, towels, and wet floors with an infected person. People with weakened immune systems, including those with diabetes, may be more likely to get athlete's foot. SYMPTOMS   Itchy areas between the toes or on the soles of the feet.   White, flaky, or scaly areas between the toes or on the soles of the feet.   Tiny, intensely itchy blisters between the toes or on the soles of the feet.   Tiny cuts on the skin. These cuts can develop a bacterial infection.   Thick or discolored toenails.  DIAGNOSIS  Your caregiver can usually tell what the problem is by doing a physical exam. Your caregiver may also take a skin sample from the rash area. The skin sample may be examined under a microscope, or it may be tested to see if fungus will grow in the sample. A sample may also be taken from your toenail for testing. TREATMENT  Over-the-counter and prescription medicines can be used to kill the fungus. These medicines are available as powders or creams. Your caregiver can suggest medicines for you. Fungal infections respond slowly to treatment. You may need to continue using your medicine for several weeks. PREVENTION   Do not share towels.   Wear sandals in wet areas, such as shared locker rooms and shared showers.   Keep your feet dry. Wear shoes that allow air to circulate. Wear cotton or wool socks.  HOME CARE INSTRUCTIONS   Take medicines as  directed by your caregiver. Do not use steroid creams on athlete's foot.   Keep your feet clean and cool. Wash your feet daily and dry them thoroughly, especially between your toes.   Change your socks every day. Wear cotton or wool socks. In hot climates, you may need to change your socks 2 to 3 times per day.   Wear sandals or canvas tennis shoes with good air circulation.   If you have blisters, soak your feet in Burow's solution or Epsom salts for 20 to 30 minutes, 2 times a day to dry out the blisters. Make sure you dry your feet thoroughly afterward.  SEEK MEDICAL CARE IF:   You have a fever.   You have swelling, soreness, warmth, or redness in your foot.   You are not getting better after 7 days of treatment.   You are not completely cured after 30 days.   You have any problems caused by your medicines.  MAKE SURE YOU:   Understand these instructions.   Will watch your condition.   Will get help right away if you are not doing well or get worse.  Document Released: 07/26/2000 Document Revised: 07/18/2011 Document Reviewed: 05/17/2011 Exit[employer] Patient Information 2012 [employer]. 

## 2012-01-26 ENCOUNTER — Other Ambulatory Visit: Payer: Self-pay | Admitting: Cardiology

## 2012-01-27 ENCOUNTER — Other Ambulatory Visit: Payer: Self-pay | Admitting: Cardiology

## 2012-02-14 ENCOUNTER — Ambulatory Visit (INDEPENDENT_AMBULATORY_CARE_PROVIDER_SITE_OTHER): Payer: Medicare Other | Admitting: Family Medicine

## 2012-02-14 VITALS — BP 138/70 | HR 110 | Temp 98.0°F | Resp 18 | Ht 67.0 in | Wt 188.0 lb

## 2012-02-14 DIAGNOSIS — L989 Disorder of the skin and subcutaneous tissue, unspecified: Secondary | ICD-10-CM

## 2012-02-14 DIAGNOSIS — H6120 Impacted cerumen, unspecified ear: Secondary | ICD-10-CM

## 2012-02-14 NOTE — Progress Notes (Signed)
  Subjective:    Patient ID: [name], male    DOB: [birth year], 71 y.o.   MRN: [id number]  HPI [name] is a 71 y.o. male Blocked ears - R greater than L - past week.  Slight fullness/discomfort.  Tried to do hearing test - blocked.   Tx:  None.  Also - notes 2 nonhealing areas, small bumps on L thigh.   Review of Systems  Constitutional: Negative for fever and chills.  HENT: Positive for hearing loss and ear pain.   Respiratory: Negative for cough and shortness of breath.        Objective:   Physical Exam  Constitutional: He is oriented to person, place, and time. He appears well-developed and well-nourished.  HENT:  Head: Normocephalic.  Right Ear: External ear normal. No tenderness. Decreased hearing is noted.  Left Ear: External ear normal. No tenderness.       Dark cerumen R canal - unable to visualize TM, with obstruction. Pinna NT.   L canal - near complete blockage of canal with dark yellow cerumen.   Pinna NT.   Eyes: Conjunctivae and EOM are normal. Pupils are equal, round, and reactive to light.  Neck: Normal range of motion.  Pulmonary/Chest: Effort normal and breath sounds normal.  Neurological: He is alert and oriented to person, place, and time.  Skin: Skin is warm and dry.     Psychiatric: He has a normal mood and affect. His behavior is normal.        Assessment & Plan:  [name] is a 71 y.o. male  Cerumen impaction - flushed after colace.  Discussed avoiding neosporin.   L leq  Lesions - appear to be papule that were abraded.  Added hx of neosporin being used - may be component of neomycin irritation.  Discussed option of derm eval now  or watch for improvement off neomycin. Will try few more weeks of sx care.

## 2012-03-01 ENCOUNTER — Encounter: Payer: Self-pay | Admitting: Internal Medicine

## 2012-03-01 ENCOUNTER — Ambulatory Visit (INDEPENDENT_AMBULATORY_CARE_PROVIDER_SITE_OTHER): Payer: Medicare Other | Admitting: Internal Medicine

## 2012-03-01 VITALS — BP 140/78 | HR 54 | Temp 98.2°F | Resp 16 | Ht 66.0 in | Wt 192.0 lb

## 2012-03-01 DIAGNOSIS — L739 Follicular disorder, unspecified: Secondary | ICD-10-CM

## 2012-03-01 DIAGNOSIS — IMO0002 Reserved for concepts with insufficient information to code with codable children: Secondary | ICD-10-CM | POA: Diagnosis not present

## 2012-03-01 DIAGNOSIS — L738 Other specified follicular disorders: Secondary | ICD-10-CM | POA: Diagnosis not present

## 2012-03-01 DIAGNOSIS — R21 Rash and other nonspecific skin eruption: Secondary | ICD-10-CM | POA: Diagnosis not present

## 2012-03-01 DIAGNOSIS — L0390 Cellulitis, unspecified: Secondary | ICD-10-CM

## 2012-03-01 MED ORDER — DOXYCYCLINE HYCLATE 100 MG PO TABS: 100.0000 mg | ORAL_TABLET | Freq: Two times a day (BID) | ORAL | Status: AC

## 2012-03-01 MED ORDER — MUPIROCIN 2 % EX OINT: TOPICAL_OINTMENT | Freq: Three times a day (TID) | CUTANEOUS | Status: AC

## 2012-03-01 NOTE — Patient Instructions (Addendum)
Staphylococcal Infections  Staphylococcus aureus (Staph) is a germ that may cause infections, especially on broken skin or wounds. Methicillin is a drug sometimes used to treat Staph infections. If the germ is resistant to methicillin, it is called MRSA. Methicillin and some other drugs may not work to treat the infection. However, there are other antibiotic drugs that may be used that will treat the infection. Your caregiver may do a culture from your wound, skin, or other site. This will tell him/her that you have MRSA present. Sometimes healthy people carry MRSA, but it may also cause an infection.  Staph infections, including MRSA can spread from one person to another by contact with an infected person. You may prevent spreading an MRSA infection to those you live with or others around you by following these steps:  Keep infections and pus or drainage material covered with clean dry bandages. Follow your caregiver's instructions on proper care of the wound. Pus from infected wounds can contain MRSA and spread the germ (bacteria) to others.   Advise your family and other close contacts to wash their hands frequently with soap and warm water. This should be done especially if they change your bandages or touch the infected wound or infectious materials.   Avoid sharing personal items (towels, washcloth, razor, clothing, or uniforms) that may have had contact with the infected wound.   Wash linens and clothes that become soiled, with hot water and laundry detergent. Drying clothes in a hot dryer, rather than air-drying, also helps kill bacteria in clothes.   Tell any healthcare providers who treat you that you have an MRSA infection. In the hospital steps will be taken to prevent the spread of MRSA.   Ask your caregiver about return to school or return to work if you have a Staph or MRSA infection.   If your caregiver has given you a follow-up appointment, it is very important to keep that  appointment. Not keeping the appointment could result in a chronic or permanent injury, pain, and disability. If there is any problem keeping the appointment, you must call back to this facility for assistance.  Staph and MRSA infections can become very serious and you should contact your caregiver if your infection gets worse. To fight the infection, follow your doctor's instructions for wound care and take all medicines as prescribed. SEEK MEDICAL CARE IF:   You have increased pus coming from the wound.   You have a fever.   You notice a bad smell coming from the wound or dressing.  SEEK IMMEDIATE MEDICAL CARE IF:  You have redness, red streaks, swelling, or increasing pain in the wound. Document Released: 10/19/2002 Document Revised: 07/18/2011 Document Reviewed: 03/14/2008 Exit[employer] Patient Information 2012 [employer]. 

## 2012-03-01 NOTE — Progress Notes (Signed)
  Subjective:    Patient ID: [name], male    DOB: [birth year], 71 y.o.   MRN: [id number]  HPI Has infected insect bite on left forearm, now tender, red , excoriated.   Review of Systems See problem list    Objective:   Physical Exam Forearm red, tender, 2-3 pustules present C and S done on pustules  Hibiclens scrub of arms  Bactroban oin. And dressing       Assessment & Plan:  Staph folliculitis and early cellulitis Doxycycline 100mg  bid

## 2012-03-03 LAB — WOUND CULTURE

## 2012-05-29 ENCOUNTER — Encounter: Payer: Self-pay | Admitting: *Deleted

## 2012-06-01 ENCOUNTER — Other Ambulatory Visit (INDEPENDENT_AMBULATORY_CARE_PROVIDER_SITE_OTHER): Payer: Medicare Other

## 2012-06-01 ENCOUNTER — Encounter: Payer: Self-pay | Admitting: Pulmonary Disease

## 2012-06-01 ENCOUNTER — Ambulatory Visit (INDEPENDENT_AMBULATORY_CARE_PROVIDER_SITE_OTHER): Payer: Medicare Other | Admitting: Pulmonary Disease

## 2012-06-01 ENCOUNTER — Ambulatory Visit (INDEPENDENT_AMBULATORY_CARE_PROVIDER_SITE_OTHER)
Admission: RE | Admit: 2012-06-01 | Discharge: 2012-06-01 | Disposition: A | Discharge status: Home / Self Care | Payer: Medicare Other | Source: Ambulatory Visit | Attending: Pulmonary Disease | Admitting: Pulmonary Disease

## 2012-06-01 VITALS — BP 126/70 | HR 63 | Temp 97.1°F | Ht 68.0 in | Wt 193.0 lb

## 2012-06-01 DIAGNOSIS — R930 Abnormal findings on diagnostic imaging of skull and head, not elsewhere classified: Secondary | ICD-10-CM

## 2012-06-01 DIAGNOSIS — Z23 Encounter for immunization: Secondary | ICD-10-CM | POA: Diagnosis not present

## 2012-06-01 DIAGNOSIS — N320 Bladder-neck obstruction: Secondary | ICD-10-CM | POA: Diagnosis not present

## 2012-06-01 DIAGNOSIS — M459 Ankylosing spondylitis of unspecified sites in spine: Secondary | ICD-10-CM

## 2012-06-01 DIAGNOSIS — J309 Allergic rhinitis, unspecified: Secondary | ICD-10-CM

## 2012-06-01 DIAGNOSIS — D126 Benign neoplasm of colon, unspecified: Secondary | ICD-10-CM | POA: Diagnosis not present

## 2012-06-01 DIAGNOSIS — I2510 Atherosclerotic heart disease of native coronary artery without angina pectoris: Secondary | ICD-10-CM | POA: Diagnosis not present

## 2012-06-01 DIAGNOSIS — E7800 Pure hypercholesterolemia, unspecified: Secondary | ICD-10-CM

## 2012-06-01 DIAGNOSIS — F411 Generalized anxiety disorder: Secondary | ICD-10-CM

## 2012-06-01 LAB — BASIC METABOLIC PANEL
BUN: 17 mg/dL (ref 6–23)
Calcium: 9.5 mg/dL (ref 8.4–10.5)
Creatinine, Ser: 1 mg/dL (ref 0.4–1.5)
GFR: 83.55 mL/min (ref >60.00)
Glucose, Bld: 103 mg/dL — ABNORMAL HIGH (ref 70–99)

## 2012-06-01 LAB — CBC WITH DIFFERENTIAL/PLATELET
Eosinophils Relative: 0.2 % (ref 0.0–5.0)
HCT: 42.6 % (ref 39.0–52.0)
Hemoglobin: 14.8 g/dL (ref 13.0–17.0)
Lymphs Abs: 1.2 10*3/uL (ref 0.7–4.0)
Monocytes Relative: 15.9 % — ABNORMAL HIGH (ref 3.0–12.0)
Neutro Abs: 2.3 10*3/uL (ref 1.4–7.7)
WBC: 4.2 10*3/uL — ABNORMAL LOW (ref 4.5–10.5)

## 2012-06-01 LAB — LIPID PANEL
Cholesterol: 124 mg/dL (ref 0–200)
HDL: 37.6 mg/dL — ABNORMAL LOW (ref >39.00)
VLDL: 20.8 mg/dL (ref 0.0–40.0)

## 2012-06-01 LAB — TSH: TSH: 2.62 u[IU]/mL (ref 0.35–5.50)

## 2012-06-01 LAB — HEPATIC FUNCTION PANEL: Albumin: 3.9 g/dL (ref 3.5–5.2)

## 2012-06-01 NOTE — Patient Instructions (Addendum)
Today we updated your med list in our EPIC system...    Continue your current medications the same...  Today we did your follow up CXR & FASTING blood work...    We will communicate the results to you...  Let's increase our exercise program...  Call for any questions...  Let's continue our yearly physicals, but call anytime if problems arise.Marland KitchenMarland Kitchen

## 2012-06-01 NOTE — Progress Notes (Addendum)
Subjective:    Patient ID: [name], male    DOB: 06-20-1941, 71 y.o.   MRN: [id number]  HPI 71 y/o WM here for an ROV... he has mult medical problems & is followed regularly by [name] for Cards w/ hx of CAD, s/p AWMI w/ VFib arrest, subseq PTCA/ stent 1/08... he also has Hypercholesterolemia & [facility] on Simvastatin & Niaspan...  ~  December 05, 2009:  he was hosp 2/11 w/ viral gastroenteritis & improved w/ fluids... 3/11 he noted cough, "tickle in throat", clear sputum, & cough worse at night... the gastroent was assoc w/ N&V as well & he felt that this "burned my esoph"> Rx OTC PPI vs H2 blockers & antireflux regimen... he was also on an ACE inhibitor- Enalapril 2.5mg  Bid & this was stopped, w/ Benicar 20mg - 1/2 tab daily substit...     He reports that cough has resolved off the Enalapril w/ the aide of antireflux Rx & he feels that he is back to baseline... he feels sl weak, low energy & we note low BP today 100/54> therefore rec to stop the Benicar & follow BP at home...  NOTE: I last saw pt 7/08 before our EMR> see prob list below:  ~  June 04, 2010:  6 month ROV doing well- no new complaints or concerns... he saw DrBrodie 8/11- stable & no changes made... also followed in [facility] w/ labs done 9/11 & improved- Niaspan decr to just one daily... Pulm is stable w/o cough etc;  GI is followed in [place] & up to date;  Ortho probles addressed by [name] & [name]- on Celebrex & OTC anti-inflamm meds Prn... he had the Pneumovax last yr age 79, & already had the 2011 Flu shot.  ~  December 04, 2010:  380mo ROV doing well w/o new complaints or concerns;  Followed in the [facility] & notes reviewed- they stopped his Niaspan recently;  He last saw [name] 2011 & stable- awaiting appt w/ [name];  We reviewed his fasting blood work 10/11> see below...  ~  June 04, 2011:  380mo ROV & he is c/o arthritis pains off & on, using OTC analgesics w/ improvement & it's  tolerable; still followed in the [facility] & taking Simva40- 1/2 tab daily w/ recent FLP looking good;  Known CAD, AWMI, etc & awaiting appt w/ [name] set up for 07/01/11> pt denies CP, palpit, SOB, or edema;  He has DJD, ankylosing spondy, hx bilat rotator cuff repairs & ganglion on left wrist> he is using a TENS unit he bought for himself on-line, plus OTC analgesics as needed...  ~  June 01, 2012:  Yearly ROV & review> [name] tends to use the physicians at the [facility] for his primary care needs & avail records indicate visits for tinea pedis, infected insect bite, cerumen impaction, etc...  We reviewed the following medical problems during today's yearly office visit>>     AR/ Cough/ AbnCXR> he has mild basilar fibrosis, nonprogressive, & sl elev right hemidiaph; stable- no change...    CAD> on ASA81, Coreg3.125Bid; s/p AWMI 2008 w/ PCI to LAD; followed by [name], Cards- last seen 11/12, asymptomatic, stable, no changes made; he denies CP, palpit, dizzy, SOB, edema; he's been too sedentary and about to restart an exercise program...    Chol> on Simva40-1/2 daily; FLP shows TChol 124, TG 104, HDL 38, LDL 66    Colon polyps, Hems> doing satis- denies abd pain, n/v, c/d, blood seen;  last colon was 2007 by [name] in [place] & f/u planned 75yrs.    Elev LFTs> routine labs showed sl incr SGOT/ SGPT and Ultrasound showed prob fatty infiltration; rec weight reduction, diet Rx...    Ortho> Ankylosing Spondy> he is HLA B27 pos, hx rotator cuff tears w/ bilat surg, ganglion cyst surg; using OTC meds as needed...     Anxiety> under stress, wife has had 3 operations- right knee, hammer toes, etc... We reviewed prob list, meds, xrays and labs> see below for updates >>  CXR 10/13 showed normal heart size, clear lungs, chr elev right hemidiaph, DJD in Tspine... Abd Sonar 10/13 showed normal GB, heterogeneous liver w/ incr echotexture c/w fatty infiltration, sm right renal cyst... LABS 10/13:  FLP- at goals on  Simva20;  Chems- wnl x sl incr LFTs;  CBC- wnl x low plat at 76K;  TSH=2.62;  PSA=1.43         Problem List:    ALLERGIC RHINITIS (ICD-477.9) - hx AR w/ +allergy testing to ragweed & dust in the past.  COUGH (ICD-786.2) - eval 3/11 for dry irritative cough, on Enalapril, & cough resolved off ACE Rx.  Hx of ABNORMAL CHEST XRAY (ICD-793.1) - hx mild basilar pulm fibrosis on old CXR/ CT scan in 2002... there is also mild nonprogressive elevation of the right hemidiaphragm on old films... ~  CXR 10/13 showed normal heart size, clear lungs, chr elev right hemidiaph, DJD in Tspine...  CAD, NATIVE VESSEL (ICD-414.01) - he had an AWMI 1/08 w/ VFib arrest, successful resus, urgent cath w/ PTCA/ stent to LAD by [name]... on ASA 81mg /d, & CARVEDILOL 3.125mg Bid... prev Enalapril stopped due to cough & switched to Benicar> pt asked to stop Benicar due to BP= 100/54, weak/ no energy etc...  denies CP, palpit, syncope, dyspnea, swelling, etc... ~  2DEcho 3/08 showed mild septal HK, w/ LVF at lower limit of norm. ~  Myoview 8/09 showed ? sm inferior MI vs diaph attenuation, no ischemia, EF= 56%.  HYPERCHOLESTEROLEMIA (ICD-272.0) - followed in the [facility] on SIMVASTATIN 40mg - 1/2 tab daily & NIASPAN 500mg - 2/d... ~  FLP 10/10 on Simva40+Niasp1000 showed TChol 94, TG 44, HDL 36, LDL 49... keep same Rx. ~  FLP 4/11 on Simva40+Niasp1000 showed TChol 61, TG 44, LDL 28, LDL 25... pt rec to decr to Simva20. ~  FLP 9/11 on Simva20+Niasp1000 showed TChol 103, TG 60, HDL 35, LDL 56... [facility] decr the Niaspan to 1/d. ~  4/12:  [facility] discontinued his Niacin Rx... ~  FLP 2/13 on simva20 showed TChol 114, TG 63, HDL 42, LDL 60 ~  FLP 10/13 on Simva20 showed TChol 124, TG 104, HDL 38, LDL 66  COLONIC POLYPS (ICD-211.3) ~  colonoscopy 11/02 by [name] showed divertics, hems, otherw neg. ~  colonoscopy in [place] [name] 10/07 w/ 1 hyperplastic polyp removed> f/u planned 34yrs.  HEMORRHOIDS (ICD-455.6) -  he reports hemorrhoid surg 10/10 in [place]...  ANKYLOSING SPONDYLITIS (ICD-720.0) - eval for Ortho [name], & Rheum DrDeveshwar w/ Dx of HLA B-27 pos Ankylosing Spondylitis> w/ mild morning stiffness & back pain prev treated w/ Celebrex Hx of ROTATOR CUFF TEAR (ICD-727.61) - hx bilat rotator cuff repairs by DrCaffrey GANGLION CYST, WRIST, LEFT (ICD-727.41) - new prob 4/11 & he will check w/ [name]...  Hx of OTHER DISORDER OF MUSCLE LIGAMENT AND FASCIA (ICD-728.89) - he had a spont hemorrhage into his right thigh 1996  ANXIETY (ICD-300.00) - his son was killed in 1998  Hx of MALARIA (  ICD-084.6) - he has malaria while in the service  LOW PLATELET COUNT >> ?etiology- Plats=76K & range 74-118, no bleeding diathesis, bruising, etc... ~  Abd Sonar 10/13 showed prob mild fatty liver & normal appearing spleen...  HEALTH MAINTENANCE: ~  GI:  followed by [name] in [place] & up to date on colonoscopies... ~  GU:  exam is neg and PSA= 2.64 ~  Immuniz:  he gets the Flu vaccine each Fall... had PNEUMOVAX in 2010 at age 89.   Past Surgical History  Procedure Date  . Rotator cuff repair     bilat by Dr. Madelon Lips  . Hemorrhoid surgery 10/10    in [place]    Outpatient Encounter Prescriptions as of 06/01/2012  Medication Sig Dispense Refill  . aspirin 81 MG EC tablet Take 81 mg by mouth daily.        . carvedilol (COREG) 3.125 MG tablet TAKE ONE TABLET BY MOUTH TWICE DAILY WITH MEALS  60 tablet  12  . simvastatin (ZOCOR) 40 MG tablet Take 0.5 tablets (20 mg total) by mouth at bedtime.  30 tablet  11  . DISCONTD: carvedilol (COREG) 3.125 MG tablet TAKE ONE TABLET BY MOUTH TWICE DAILY WITH MEALS  60 tablet  12  . DISCONTD: econazole nitrate 1 % cream Apply topically daily.  30 g  1  . DISCONTD: terbinafine (LAMISIL) 250 MG tablet Take 1 tablet (250 mg total) by mouth daily.  21 tablet  0    No Known Allergies   Current Medications, Allergies, Past Medical History, Past Surgical History, Family History,  and Social History were reviewed in [employer] record.     Review of Systems    The patient denies fever, chills, sweats, anorexia, fatigue, weakness, malaise, weight loss, sleep disorder, blurring, diplopia, eye irritation, eye discharge, vision loss, eye pain, photophobia, earache, ear discharge, tinnitus, decreased hearing, nasal congestion, nosebleeds, sore throat, hoarseness, chest pain, palpitations, syncope, dyspnea on exertion, orthopnea, PND, peripheral edema, cough, dyspnea at rest, excessive sputum, hemoptysis, wheezing, pleurisy, nausea, vomiting, diarrhea, constipation, change in bowel habits, abdominal pain, melena, hematochezia, jaundice, gas/bloating, indigestion/heartburn, dysphagia, odynophagia, dysuria, hematuria, urinary frequency, urinary hesitancy, nocturia, incontinence, back pain, joint pain, joint swelling, muscle cramps, muscle weakness, stiffness, arthritis, sciatica, restless legs, leg pain at night, leg pain with exertion, rash, itching, dryness, suspicious lesions, paralysis, paresthesias, seizures, tremors, vertigo, transient blindness, frequent falls, frequent headaches, difficulty walking, depression, anxiety, memory loss, confusion, cold intolerance, heat intolerance, polydipsia, polyphagia, polyuria, unusual weight change, abnormal bruising, bleeding, enlarged lymph nodes, urticaria, allergic rash, hay fever, and recurrent infections.     Objective:   Physical Exam      WD, WN, 71 y/o WM in NAD... GENERAL:  Alert & oriented; pleasant & cooperative... HEENT:  [place]/AT, EOM-wnl, PERRLA, EACs-clear, TMs-wnl, NOSE-clear, THROAT-clear & wnl. NECK:  Supple w/ fairROM; no JVD; normal carotid impulses w/o bruits; no thyromegaly or nodules palpated; no lymphadenopathy. CHEST:  Clear to P & A; without wheezes/ rales/ or rhonchi. HEART:  Regular Rhythm; without murmurs/ rubs/ or gallops. ABDOMEN:  Soft & nontender; normal bowel sounds; no organomegaly or  masses detected. RECTAL:  prostate 3+ smooth w/o nodules, rectal neg- stool heme neg. EXT: without deformities, mild arthritic changes; no varicose veins/ venous insuffic/ or edema. NEURO:  CN's intact; motor testing normal; sensory testing normal; gait normal & balance OK. DERM:  No lesions noted; no rash etc...  RADIOLOGY DATA:  Reviewed in the EPIC EMR & discussed w/ the patient.Marland KitchenMarland Kitchen  LABORATORY DATA:  Reviewed in the EPIC EMR & discussed w/ the patient...   Assessment & Plan:    CAD>  followed by [name] & stable on ASA, BBlocker, Statin;  Continue Rx + diet & exercise...  CHOL>  Prev followed in the [facility] & they stopped his Niacin, on Sima20 w/ excellent FLP...  GI> stable & up to date...  ORTHO>  Hx AS, etc> he uses Tylenol for pain & avoids NSAIDs as he thinks the Celebrex may have caused his MI...  Other medical issues as noted...   Patient's Medications  New Prescriptions   No medications on file  Previous Medications   ASPIRIN 81 MG EC TABLET    Take 81 mg by mouth daily.     CARVEDILOL (COREG) 3.125 MG TABLET    TAKE ONE TABLET BY MOUTH TWICE DAILY WITH MEALS   SIMVASTATIN (ZOCOR) 40 MG TABLET    Take 0.5 tablets (20 mg total) by mouth at bedtime.  Modified Medications   No medications on file  Discontinued Medications   CARVEDILOL (COREG) 3.125 MG TABLET    TAKE ONE TABLET BY MOUTH TWICE DAILY WITH MEALS   ECONAZOLE NITRATE 1 % CREAM    Apply topically daily.   TERBINAFINE (LAMISIL) 250 MG TABLET    Take 1 tablet (250 mg total) by mouth daily.

## 2012-06-09 ENCOUNTER — Other Ambulatory Visit: Payer: Self-pay | Admitting: Pulmonary Disease

## 2012-06-09 DIAGNOSIS — D126 Benign neoplasm of colon, unspecified: Secondary | ICD-10-CM

## 2012-06-09 DIAGNOSIS — E7800 Pure hypercholesterolemia, unspecified: Secondary | ICD-10-CM

## 2012-06-09 DIAGNOSIS — R109 Unspecified abdominal pain: Secondary | ICD-10-CM

## 2012-06-10 ENCOUNTER — Ambulatory Visit (HOSPITAL_COMMUNITY)
Admission: RE | Admit: 2012-06-10 | Discharge: 2012-06-10 | Disposition: A | Discharge status: Home / Self Care | Payer: Medicare Other | Source: Ambulatory Visit | Attending: Pulmonary Disease | Admitting: Pulmonary Disease

## 2012-06-10 DIAGNOSIS — R945 Abnormal results of liver function studies: Secondary | ICD-10-CM | POA: Diagnosis not present

## 2012-06-10 DIAGNOSIS — R109 Unspecified abdominal pain: Secondary | ICD-10-CM | POA: Diagnosis not present

## 2012-07-03 DIAGNOSIS — D235 Other benign neoplasm of skin of trunk: Secondary | ICD-10-CM | POA: Diagnosis not present

## 2012-07-03 DIAGNOSIS — L570 Actinic keratosis: Secondary | ICD-10-CM | POA: Diagnosis not present

## 2012-07-14 ENCOUNTER — Encounter: Payer: Medicare Other | Admitting: Cardiovascular Disease

## 2012-07-18 ENCOUNTER — Ambulatory Visit (INDEPENDENT_AMBULATORY_CARE_PROVIDER_SITE_OTHER): Payer: Medicare Other | Admitting: Family Medicine

## 2012-07-18 VITALS — BP 124/72 | HR 61 | Temp 98.1°F | Resp 16 | Ht 68.0 in | Wt 185.0 lb

## 2012-07-18 DIAGNOSIS — S76319A Strain of muscle, fascia and tendon of the posterior muscle group at thigh level, unspecified thigh, initial encounter: Secondary | ICD-10-CM

## 2012-07-18 DIAGNOSIS — IMO0002 Reserved for concepts with insufficient information to code with codable children: Secondary | ICD-10-CM

## 2012-07-18 NOTE — Progress Notes (Signed)
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 The muscle is tender posteriorly and there is mild swelling as well. He said he was doing "something funny" and total pop when it first began with the ensuing pain since. He's had no cramping.  Objective: Marked ecchymotic discoloration of the skin posteriorly around the thigh with tenderness and mild swelling. Has full range of motion of the right lower extremity and no tenderness over the saphenous vein or calf.  Assessment: Muscle tear in the hamstring, associated with persistent bleeding.  Plan: Postpone his stress test, continue the Advil, although from the aspirin for several days, and use an Ace wrap.

## 2012-07-18 NOTE — Patient Instructions (Addendum)
Hamstring Strain   Hamstrings are the large muscles in the back of the thighs. A strain or tear injury happens when there is a sudden stretch or pull on these muscles and tendons. Tendons are cord like structures that attach muscle to bone. These injuries are commonly seen in activities such as sprinting due to sudden acceleration.   DIAGNOSIS   Often the diagnosis can be made by examination.  HOME CARE INSTRUCTIONS    Apply ice to the sore area for 15 to 20minutes, 3 to 4 times per day. Do this while awake for the first 2 days. Put the ice in a plastic bag, and place a towel between the bag of ice and your skin.   Keep your knee flexed when possible. This means your foot is held off the ground slightly if you are on crutches. When lying down, a pillow under the knee will take strain off the muscles and provide some relief.   If a compression bandage such as an ace wrap was applied, use it until you are seen again. You may remove it for sleeping, showers and baths. If the wrap seems to be too tight and is uncomfortable, wrap it more loosely. If your toes or foot are getting cold or blue, it is too tight.   Walk or move around as the pain allows, or as instructed. Resume full activities as suggested by your caregiver. This is often safest when the strength of the injured leg has nearly returned to normal.   Only take over-the-counter or prescription medicines for pain, discomfort, or fever as directed by your caregiver.  SEEK MEDICAL CARE IF:    You have an increase in bruising, swelling or pain.   You notice coldness or blueness of your toes or foot.   Pain relief is not obtained with medications.   You have increasing pain in the area and seem to be getting worse rather than better.   You notice your thigh getting larger in size (this could indicate bleeding into the muscle).  Document Released: 04/23/2001 Document Revised: 10/21/2011 Document Reviewed: 07/31/2008  [employer] Patient Information 2013  [employer].

## 2012-07-22 ENCOUNTER — Encounter: Payer: Medicare Other | Admitting: Cardiovascular Disease

## 2012-07-28 ENCOUNTER — Other Ambulatory Visit: Payer: Self-pay | Admitting: Cardiovascular Disease

## 2012-07-31 ENCOUNTER — Telehealth: Payer: Self-pay | Admitting: Cardiovascular Disease

## 2012-07-31 NOTE — Telephone Encounter (Signed)
Patient called because he said some one from this office called him and did  Not leave a message. Patient was made aware that I was not able to find  any diocumentation that any one had  called him from this office. Pt was made aware that he was scheduled for CBC w/diff at [facility] lab on 07/14/12; Order was placed on 06/09/12 by Dr. Kriste Basque. Pt verbalized understanding.

## 2012-07-31 NOTE — Telephone Encounter (Signed)
New Problem:    Patient is returning a call.  Please call back.

## 2012-08-03 ENCOUNTER — Other Ambulatory Visit (INDEPENDENT_AMBULATORY_CARE_PROVIDER_SITE_OTHER): Payer: Medicare Other

## 2012-08-03 DIAGNOSIS — E7800 Pure hypercholesterolemia, unspecified: Secondary | ICD-10-CM | POA: Diagnosis not present

## 2012-08-03 DIAGNOSIS — D126 Benign neoplasm of colon, unspecified: Secondary | ICD-10-CM

## 2012-08-03 LAB — HEPATIC FUNCTION PANEL
ALT: 62 U/L — ABNORMAL HIGH (ref 0–53)
Albumin: 4.3 g/dL (ref 3.5–5.2)
Alkaline Phosphatase: 53 U/L (ref 39–117)
Total Protein: 6.9 g/dL (ref 6.0–8.3)

## 2012-08-03 LAB — CBC WITH DIFFERENTIAL/PLATELET
Basophils Absolute: 0 10*3/uL (ref 0.0–0.1)
Hemoglobin: 14.4 g/dL (ref 13.0–17.0)
Lymphocytes Relative: 24.8 % (ref 12.0–46.0)
Monocytes Relative: 17.4 % — ABNORMAL HIGH (ref 3.0–12.0)
Neutrophils Relative %: 57.3 % (ref 43.0–77.0)
Platelets: 82 10*3/uL — ABNORMAL LOW (ref 150.0–400.0)
RDW: 15.2 % — ABNORMAL HIGH (ref 11.5–14.6)

## 2012-08-03 LAB — BASIC METABOLIC PANEL
CO2: 28 mEq/L (ref 19–32)
Calcium: 9.7 mg/dL (ref 8.4–10.5)
Glucose, Bld: 163 mg/dL — ABNORMAL HIGH (ref 70–99)
Sodium: 142 mEq/L (ref 135–145)

## 2012-08-04 ENCOUNTER — Telehealth: Payer: Self-pay | Admitting: Pulmonary Disease

## 2012-08-04 NOTE — Telephone Encounter (Signed)
Called and spoke with pt and he is aware of lab results per SN.  See results note.   

## 2012-09-02 ENCOUNTER — Encounter: Payer: Self-pay | Admitting: *Deleted

## 2012-09-02 ENCOUNTER — Encounter (HOSPITAL_COMMUNITY): Payer: Self-pay | Admitting: Pharmacy Technician

## 2012-09-02 ENCOUNTER — Ambulatory Visit (INDEPENDENT_AMBULATORY_CARE_PROVIDER_SITE_OTHER): Payer: Medicare Other | Admitting: Cardiovascular Disease

## 2012-09-02 ENCOUNTER — Encounter: Payer: Self-pay | Admitting: Cardiovascular Disease

## 2012-09-02 DIAGNOSIS — I25810 Atherosclerosis of coronary artery bypass graft(s) without angina pectoris: Secondary | ICD-10-CM | POA: Diagnosis not present

## 2012-09-02 LAB — CBC WITH DIFFERENTIAL/PLATELET
Basophils Absolute: 0 10*3/uL (ref 0.0–0.1)
Hemoglobin: 14.8 g/dL (ref 13.0–17.0)
Lymphocytes Relative: 32.1 % (ref 12.0–46.0)
Monocytes Relative: 16.2 % — ABNORMAL HIGH (ref 3.0–12.0)
Neutro Abs: 2.1 10*3/uL (ref 1.4–7.7)
RDW: 15.1 % — ABNORMAL HIGH (ref 11.5–14.6)
WBC: 4 10*3/uL — ABNORMAL LOW (ref 4.5–10.5)

## 2012-09-02 LAB — PROTIME-INR: INR: 1.1 ratio — ABNORMAL HIGH (ref 0.8–1.0)

## 2012-09-02 LAB — BASIC METABOLIC PANEL
CO2: 27 mEq/L (ref 19–32)
Chloride: 106 mEq/L (ref 96–112)
Creatinine, Ser: 1 mg/dL (ref 0.4–1.5)
Sodium: 141 mEq/L (ref 135–145)

## 2012-09-02 NOTE — Patient Instructions (Addendum)
Your physician has requested that you have a cardiac catheterization. Cardiac catheterization is used to diagnose and/or treat various heart conditions. Doctors may recommend this procedure for a number of different reasons. The most common reason is to evaluate chest pain. Chest pain can be a symptom of coronary artery disease (CAD), and cardiac catheterization can show whether plaque is narrowing or blocking your heart's arteries. This procedure is also used to evaluate the valves, as well as measure the blood flow and oxygen levels in different parts of your heart. For further information please visit [email or web address] Please follow instruction sheet, as given. Scheduled for January 23. 2014  Coronary Angiography Coronary angiography is an X-ray procedure used to look at the arteries in the heart. In this procedure, a dye is injected through a long, hollow tube (catheter). The catheter is about the size of a piece of cooked spaghetti. The catheter injects a dye into an artery in your groin. X-rays are then taken to show if there is a blockage in the arteries of your heart. BEFORE THE PROCEDURE   Let your caregiver know if you have allergies to shellfish or contrast dye. Also let your caregiver know if you have kidney problems or failure.  Do not eat or drink starting from midnight up to the time of the procedure, or as directed.  You may drink enough water to take your medications the morning of the procedure if you were instructed to do so.  You should be at the hospital or outpatient facility where the procedure is to be done 60 minutes prior to the procedure or as directed. PROCEDURE  You may be given an IV medication to help you relax before the procedure.  You will be prepared for the procedure by washing and shaving the area where the catheter will be inserted. This is usually done in the groin but may be done in the fold of your arm by your elbow.  A medicine will be given to numb  your groin where the catheter will be inserted.  A specially trained doctor will insert the catheter into an artery in your groin. The catheter is guided by using a special type of X-ray (fluoroscopy) to the blood vessel being examined.  A special dye is then injected into the catheter and X-rays are taken. The dye helps to show where any narrowing or blockages are located in the heart arteries. AFTER THE PROCEDURE   After the procedure you will be kept in bed lying flat for several hours. You will be instructed to not bend or cross your legs.  The groin insertion site will be watched and checked frequently.  The pulse in your feet will be checked frequently.  Additional blood tests, X-rays and an EKG may be done.  You may stay in the hospital overnight for observation. SEEK IMMEDIATE MEDICAL CARE IF:   You develop chest pain, shortness of breath, feel faint, or pass out.  There is bleeding, swelling, or drainage from the catheter insertion site.  You develop pain, discoloration, coldness, or severe bruising in the leg or area where the catheter was inserted.  You have a fever. Document Released: 02/02/2003 Document Revised: 10/21/2011 Document Reviewed: 03/23/2008 [facility] Patient Information 2013 [place], [place].

## 2012-09-02 NOTE — Progress Notes (Signed)
Exercise Treadmill Test  Pre-Exercise Testing Evaluation Rhythm: sinus bradycardia  Rate: 50                 Test  Exercise Tolerance Test Ordering MD: Tonny Bollman, MD  Interpreting MD: Tonny Bollman, MD  Unique Test No: 1  Treadmill:  1  Indication for ETT: CAD  Contraindication to ETT: No   Stress Modality: exercise - treadmill  Cardiac Imaging Performed: non   Protocol: standard Bruce - maximal  Max BP:  223/84  Max MPHR (bpm):  151 85% MPR (bpm):  128  MPHR obtained (bpm):  131 % MPHR obtained:  86%  Reached 85% MPHR (min:sec):  5:45 Total Exercise Time (min-sec):  6:00  Workload in METS:  7.0 Borg Scale: 17  Reason ETT Terminated:  downsloping ST depression    ST Segment Analysis At Rest: normal ST segments - no evidence of significant ST depression With Exercise: significant ischemic ST depression  Other Information Arrhythmia:  Yes Angina during ETT:  present (1) Quality of ETT:  diagnostic  ETT Interpretation:  abnormal - evidence of ST depression consistent with ischemia  Comments: Abnormal exercise treadmill study with exertional angina and significant ST depression lasting 5 minutes into recovery.  Recommendations: Cardiac catheterization. I have reviewed risks, indications, and alternatives to cardiac cath and PCI with the patient. He understands and agrees to proceed. He has developed exertional angina and shortness of breath with moderate activity (CCS Class 2). This has occurred over the past few months. He hasn't had resting symptoms.

## 2012-09-03 ENCOUNTER — Ambulatory Visit (HOSPITAL_COMMUNITY)
Admission: RE | Admit: 2012-09-03 | Discharge: 2012-09-03 | Disposition: A | Discharge status: Home / Self Care | Payer: Medicare Other | Source: Ambulatory Visit | Attending: Cardiovascular Disease | Admitting: Cardiovascular Disease

## 2012-09-03 ENCOUNTER — Encounter (HOSPITAL_COMMUNITY)
Admission: RE | Disposition: A | Discharge status: Home / Self Care | Payer: Self-pay | Source: Ambulatory Visit | Attending: Cardiovascular Disease

## 2012-09-03 DIAGNOSIS — R9439 Abnormal result of other cardiovascular function study: Secondary | ICD-10-CM | POA: Diagnosis not present

## 2012-09-03 DIAGNOSIS — I209 Angina pectoris, unspecified: Secondary | ICD-10-CM | POA: Diagnosis not present

## 2012-09-03 DIAGNOSIS — I252 Old myocardial infarction: Secondary | ICD-10-CM | POA: Insufficient documentation

## 2012-09-03 DIAGNOSIS — I2510 Atherosclerotic heart disease of native coronary artery without angina pectoris: Secondary | ICD-10-CM | POA: Insufficient documentation

## 2012-09-03 DIAGNOSIS — Z9861 Coronary angioplasty status: Secondary | ICD-10-CM | POA: Diagnosis not present

## 2012-09-03 DIAGNOSIS — I208 Other forms of angina pectoris: Secondary | ICD-10-CM | POA: Diagnosis present

## 2012-09-03 HISTORY — PX: LEFT HEART CATHETERIZATION WITH CORONARY ANGIOGRAM: SHX5451

## 2012-09-03 LAB — GLUCOSE, CAPILLARY: Glucose-Capillary: 84 mg/dL (ref 70–99)

## 2012-09-03 SURGERY — LEFT HEART CATHETERIZATION WITH CORONARY ANGIOGRAM
Anesthesia: LOCAL

## 2012-09-03 MED ORDER — SODIUM CHLORIDE 0.9 % IV SOLN: 1.0000 mL/kg/h | INTRAVENOUS | Status: DC

## 2012-09-03 MED ORDER — ASPIRIN 81 MG PO CHEW
324.0000 mg | CHEWABLE_TABLET | ORAL | Status: AC
  Administered 2012-09-03: 324 mg via ORAL
  Filled 2012-09-03: qty 4

## 2012-09-03 MED ORDER — SODIUM CHLORIDE 0.9 % IV SOLN: 250.0000 mL | INTRAVENOUS | Status: DC | PRN

## 2012-09-03 MED ORDER — VERAPAMIL HCL 2.5 MG/ML IV SOLN
INTRAVENOUS | Status: AC
  Filled 2012-09-03: qty 2

## 2012-09-03 MED ORDER — SODIUM CHLORIDE 0.9 % IJ SOLN: 3.0000 mL | Freq: Two times a day (BID) | INTRAMUSCULAR | Status: DC

## 2012-09-03 MED ORDER — FENTANYL CITRATE 0.05 MG/ML IJ SOLN
INTRAMUSCULAR | Status: AC
  Filled 2012-09-03: qty 2

## 2012-09-03 MED ORDER — MIDAZOLAM HCL 2 MG/2ML IJ SOLN
INTRAMUSCULAR | Status: AC
  Filled 2012-09-03: qty 2

## 2012-09-03 MED ORDER — SODIUM CHLORIDE 0.9 % IV SOLN
INTRAVENOUS | Status: DC
  Administered 2012-09-03: 13:00:00 via INTRAVENOUS

## 2012-09-03 MED ORDER — ACETAMINOPHEN 325 MG PO TABS: 650.0000 mg | ORAL_TABLET | ORAL | Status: DC | PRN

## 2012-09-03 MED ORDER — ONDANSETRON HCL 4 MG/2ML IJ SOLN: 4.0000 mg | Freq: Four times a day (QID) | INTRAMUSCULAR | Status: DC | PRN

## 2012-09-03 MED ORDER — SODIUM CHLORIDE 0.9 % IJ SOLN: 3.0000 mL | INTRAMUSCULAR | Status: DC | PRN

## 2012-09-03 MED ORDER — HEPARIN SODIUM (PORCINE) 1000 UNIT/ML IJ SOLN
INTRAMUSCULAR | Status: AC
  Filled 2012-09-03: qty 1

## 2012-09-03 MED ORDER — NITROGLYCERIN 0.2 MG/ML ON CALL CATH LAB
INTRAVENOUS | Status: AC
  Filled 2012-09-03: qty 1

## 2012-09-03 MED ORDER — LIDOCAINE HCL (PF) 1 % IJ SOLN
INTRAMUSCULAR | Status: AC
  Filled 2012-09-03: qty 30

## 2012-09-03 MED ORDER — HEPARIN (PORCINE) IN NACL 2-0.9 UNIT/ML-% IJ SOLN
INTRAMUSCULAR | Status: AC
  Filled 2012-09-03: qty 1000

## 2012-09-03 NOTE — H&P (Signed)
Patient ID: [name] MRN: [id number] DOB/AGE: 70/15/1942 70 y.o. Admit date: (Not on file)  Primary Care Physician:NADEL,SCOTT M, MD Primary Cardiologist: Dr Excell Seltzer Active Problems:  Exertional angina  HPI: 72 year-old male with known CAD and prior anterior MI who presented for an exercise treadmill study yesterday (see result below). He has developed exertional chest pressure with moderate level activity. Also feels fatigued and short of breath. No symptoms at low level activity or rest. No other complaints. No bleeding problems, GI symptoms, edema, or palpitations. He is compliant with meds.  Past Medical History  Diagnosis Date  . Coronary atherosclerosis of native coronary artery   . Hyperlipidemia     with low HDL  . Ankylosing spondylitis   . Hypertension   . Allergic rhinitis   . Cough   . Abnormal chest x-ray   . Colonic polyp   . Hemorrhoids   . Rotator cuff tear   . Ganglion cyst of wrist     left  . Other disorder of muscle, ligament, and fascia   . Anxiety   . History of malaria     Past Surgical History  Procedure Date  . Rotator cuff repair     bilat by Dr. Madelon Lips  . Hemorrhoid surgery 10/10    in [place]    Family History  Problem Relation Age of Onset  . Cancer Mother     melanoma  . Emphysema Mother   . Rheum arthritis Mother   . Diabetes Mother     History   Social History  . Marital Status: Married    Spouse Name: Elnita Maxwell    Number of Children: 2  . Years of Education: N/A   Occupational History  . Not on file.   Social History Main Topics  . Smoking status: Never Smoker   . Smokeless tobacco: Never Used  . Alcohol Use: No     Comment: occ 1 beer, wine occ  . Drug Use: No  . Sexually Active: Yes   Other Topics Concern  . Not on file   Social History Narrative  . No narrative on file     No prescriptions prior to admission   ROS: General: no fevers/chills/night sweats/weight loss Eyes: no blurry vision, diplopia, or  amaurosis ENT: no sore throat or hearing loss Resp: no cough, wheezing, or hemoptysis CV: no edema or palpitations GI: no abdominal pain, nausea, vomiting, diarrhea, or constipation GU: no dysuria, frequency, or hematuria Skin: no rash Neuro: no headache, numbness, tingling, or weakness of extremities Musculoskeletal: no joint pain or swelling Heme: no bleeding, DVT, or easy bruising Endo: no polydipsia or polyuria  Physical Exam: Pt is alert and oriented, WD, WN, in no distress. HEENT: normal Neck: JVP normal. Carotid upstrokes normal without bruits. No thyromegaly. Lungs: equal expansion, clear bilaterally CV: Apex is discrete and nondisplaced, RRR without murmur or gallop Abd: soft, NT, +BS, no bruit, no hepatosplenomegaly Back: no CVA tenderness Ext: no C/C/E        Femoral pulses 2+= without bruits        DP/PT pulses intact and = Skin: warm and dry without rash Neuro: CNII-XII intact             Strength intact = bilaterally  Labs:   Lab Results  Component Value Date   WBC 4.0* 09/02/2012   HGB 14.8 09/02/2012   HCT 43.0 09/02/2012   MCV 90.2 09/02/2012   PLT 83.0* 09/02/2012     Lab  09/02/12 1313  NA 141  K 4.2  CL 106  CO2 27  BUN 17  CREATININE 1.0  CALCIUM 9.4  PROT --  BILITOT --  ALKPHOS --  ALT --  AST --  GLUCOSE 75   Lab Results  Component Value Date   CKTOTAL 60 10/04/2009   CKMB 1.5 10/04/2009   TROPONINI  Value: 0.03        NO INDICATION OF MYOCARDIAL INJURY. 10/04/2009    Lab Results  Component Value Date   CHOL 124 06/01/2012   CHOL 114 10/09/2011   CHOL 117 05/20/2011   Lab Results  Component Value Date   HDL 37.60* 06/01/2012   HDL 41.60 10/09/2011   HDL 11.91 05/20/2011   Lab Results  Component Value Date   LDLCALC 66 06/01/2012   LDLCALC 60 10/09/2011   LDLCALC 59 05/20/2011   Lab Results  Component Value Date   TRIG 104.0 06/01/2012   TRIG 63.0 10/09/2011   TRIG 85.0 05/20/2011   Lab Results  Component Value Date   CHOLHDL  3 06/01/2012   CHOLHDL 3 10/09/2011   CHOLHDL 3 05/20/2011   No results found for this basename: LDLDIRECT      Radiology: No results found.  Exercise Treadmill Study: Exercise Tolerance Test  Ordering MD: Tonny Bollman, MD  Interpreting MD: Tonny Bollman, MD   Unique Test No: 1  Treadmill: 1   Indication for ETT: CAD  Contraindication to ETT: No   Stress Modality: exercise - treadmill  Cardiac Imaging Performed: non   Protocol: standard Bruce - maximal  Max BP: 223/84   Max MPHR (bpm): 151  85% MPR (bpm): 128   MPHR obtained (bpm): 131  % MPHR obtained: 86%   Reached 85% MPHR (min:sec): 5:45  Total Exercise Time (min-sec): 6:00   Workload in METS: 7.0  Borg Scale: 17   Reason ETT Terminated: downsloping ST depression    ST Segment Analysis  At Rest: normal ST segments - no evidence of significant ST depression  With Exercise: significant ischemic ST depression  Other Information  Arrhythmia: Yes Angina during ETT: present (1)  Quality of ETT: diagnostic  ETT Interpretation: abnormal - evidence of ST depression consistent with ischemia  Comments:  Abnormal exercise treadmill study with exertional angina and significant ST depression lasting 5 minutes into recovery.  Recommendations:  Cardiac catheterization. I have reviewed risks, indications, and alternatives to cardiac cath and PCI with the patient. He understands and agrees to proceed. He has developed exertional angina and shortness of breath with moderate activity (CCS Class 2). This has occurred over the past few months. He hasn't had resting symptoms.   ASSESSMENT AND PLAN:  Exertional angina, CCS Class 2. Pt with exercise treadmill yesterday with some high-risk features (prolonged ST change into recovery, diffuse ST depression in multiple leads). Plan cardiac cath and possible PCI today. He understands risks, indication, and alternatives. We discussed this yesterday at length.  Signed: Tonny Bollman 09/03/2012, 11:22  AM

## 2012-09-03 NOTE — Interval H&P Note (Signed)
History and Physical Interval Note:  09/03/2012 1:58 PM  [name]  has presented today for surgery, with the diagnosis of Chest pain  The various methods of treatment have been discussed with the patient and family. After consideration of risks, benefits and other options for treatment, the patient has consented to  Procedure(s) (LRB) with comments: LEFT HEART CATHETERIZATION WITH CORONARY ANGIOGRAM (N/A) as a surgical intervention .  The patient's history has been reviewed, patient examined, no change in status, stable for surgery.  I have reviewed the patient's chart and labs.  Questions were answered to the patient's satisfaction.     Tonny Bollman

## 2012-09-03 NOTE — CV Procedure (Addendum)
   Cardiac Catheterization Procedure Note  Name: [name] MRN: [id number] DOB: May 22, 1941  Procedure: Left Heart Cath, Selective Coronary Angiography, LV angiography  Indication: Abnormal stress test, exertional angina CCS Class 2  Procedural Details: The right wrist was prepped, draped, and anesthetized with 1% lidocaine. Using the modified Seldinger technique, a 5 French sheath was introduced into the right radial artery. 3 mg of verapamil was administered through the sheath, weight-based unfractionated heparin was administered intravenously. Standard Judkins catheters were used for selective coronary angiography and left ventriculography. Catheter exchanges were performed over an exchange length guidewire. There were no immediate procedural complications. A TR band was used for radial hemostasis at the completion of the procedure.  The patient was transferred to the post catheterization recovery area for further monitoring.  Procedural Findings: Hemodynamics: AO 116/60 LV 114/11   Coronary angiography: Coronary dominance: right  Left mainstem: Widely patent without obstructive disease.  Left anterior descending (LAD): Large vessel, wraps around the left ventricular apex. The stent in the proximal LAD is widely patent. There is minor nonobstructive disease of 20-30% in the mid LAD. The first diagonal branch has significant disease. This is a moderate caliber vessel with 50% stenosis at the ostium and 75% stenosis in the mid vessel.  Left circumflex (LCx): The AV groove circumflex is patent. There is minor irregularity. The first OM branch is large. There is no significant stenosis but there are diffuse irregularities with up to 20-30% stenosis.  Right coronary artery (RCA): This is a large, dominant vessel. The PDA and posterolateral branches are patent. The proximal vessel is patent with minor nonobstructive disease. The mid vessel has 40-50% stenosis. There is no high-grade  disease throughout the course of the right coronary artery.  Left ventriculography: Left ventricular systolic function is normal, LVEF is estimated at 55-65%, there is no significant mitral regurgitation   Final Conclusions:   1. Single-vessel coronary artery disease with patency of the LAD stent and significant stenosis of a moderate caliber diagonal branch. 2. Minor nonobstructive disease of the left circumflex and right coronary arteries 3. Normal left ventricular function.  Recommendations: Considering the patient's mild anginal symptoms, preserved LV function, and thrombocytopenia, I think it is best to manage him medically. He has significant branch vessel stenosis of the diagonal but his major coronary arteries are patent. We could consider percutaneous treatment if his symptoms progress, I would favor medical therapy.  Tonny Bollman 09/03/2012, 2:55 PM

## 2012-09-11 DIAGNOSIS — K130 Diseases of lips: Secondary | ICD-10-CM | POA: Diagnosis not present

## 2012-09-11 DIAGNOSIS — D235 Other benign neoplasm of skin of trunk: Secondary | ICD-10-CM | POA: Diagnosis not present

## 2012-09-17 ENCOUNTER — Other Ambulatory Visit: Payer: Self-pay

## 2012-09-17 DIAGNOSIS — D696 Thrombocytopenia, unspecified: Secondary | ICD-10-CM

## 2012-09-18 ENCOUNTER — Telehealth: Payer: Self-pay | Admitting: Internal Medicine

## 2012-09-18 NOTE — Telephone Encounter (Signed)
C/D 09/18/12 for appt.09/30/12 °

## 2012-09-18 NOTE — Telephone Encounter (Signed)
S/W PT IN REF TO NP APPT. ON 09/30/12@9 :30 REFERRING DR Excell Seltzer DX-THROMBOCYTOPENIA MAILED NP PACKET

## 2012-09-29 ENCOUNTER — Other Ambulatory Visit: Payer: Self-pay | Admitting: Medical Oncology

## 2012-09-29 DIAGNOSIS — D696 Thrombocytopenia, unspecified: Secondary | ICD-10-CM

## 2012-09-30 ENCOUNTER — Encounter: Payer: Self-pay | Admitting: Internal Medicine

## 2012-09-30 ENCOUNTER — Ambulatory Visit: Payer: Medicare Other

## 2012-09-30 ENCOUNTER — Telehealth: Payer: Self-pay | Admitting: Cardiovascular Disease

## 2012-09-30 ENCOUNTER — Ambulatory Visit (HOSPITAL_BASED_OUTPATIENT_CLINIC_OR_DEPARTMENT_OTHER): Payer: Medicare Other | Admitting: Internal Medicine

## 2012-09-30 ENCOUNTER — Telehealth: Payer: Self-pay | Admitting: Medical Oncology

## 2012-09-30 ENCOUNTER — Other Ambulatory Visit: Payer: Medicare Other | Admitting: Lab

## 2012-09-30 ENCOUNTER — Other Ambulatory Visit: Payer: Self-pay | Admitting: Medical Oncology

## 2012-09-30 VITALS — BP 112/66 | HR 53 | Temp 98.8°F | Resp 18 | Ht 66.0 in | Wt 186.7 lb

## 2012-09-30 DIAGNOSIS — D696 Thrombocytopenia, unspecified: Secondary | ICD-10-CM

## 2012-09-30 DIAGNOSIS — D72819 Decreased white blood cell count, unspecified: Secondary | ICD-10-CM

## 2012-09-30 DIAGNOSIS — D709 Neutropenia, unspecified: Secondary | ICD-10-CM | POA: Diagnosis not present

## 2012-09-30 LAB — CBC WITH DIFFERENTIAL/PLATELET
BASO%: 0 % (ref 0.0–2.0)
EOS%: 0.7 % (ref 0.0–7.0)
MCH: 30.9 pg (ref 27.2–33.4)
MCV: 91.3 fL (ref 79.3–98.0)
MONO%: 22.1 % — ABNORMAL HIGH (ref 0.0–14.0)
NEUT#: 1.4 10*3/uL — ABNORMAL LOW (ref 1.5–6.5)
RBC: 4.37 10*6/uL (ref 4.20–5.82)
RDW: 14.6 % (ref 11.0–14.6)
nRBC: 0 % (ref 0–0)

## 2012-09-30 LAB — COMPREHENSIVE METABOLIC PANEL (CC13)
ALT: 40 U/L (ref 0–55)
AST: 26 U/L (ref 5–34)
Creatinine: 1 mg/dL (ref 0.7–1.3)
Total Bilirubin: 0.59 mg/dL (ref 0.20–1.20)

## 2012-09-30 LAB — LACTATE DEHYDROGENASE (CC13): LDH: 107 U/L — ABNORMAL LOW (ref 125–245)

## 2012-09-30 NOTE — Telephone Encounter (Signed)
Scheduled with Dee in Short stay for 0800.  Pre procedure instructions given to pt.

## 2012-09-30 NOTE — Progress Notes (Signed)
Checked in new patient. No financial issues. °

## 2012-09-30 NOTE — Telephone Encounter (Signed)
gv and printed pt appt schedule for March...per Dr Shirline Frees no visit in 1 week just in [facility]

## 2012-09-30 NOTE — Patient Instructions (Signed)
You have persistent thrombocytopenia suspicious for ITP plus/minus drug-induced. Your CBC today also showed decrease in your white blood count. We discussed proceeding with a bone marrow biopsy and aspirate. Followup in 3 weeks to discuss the biopsy results

## 2012-09-30 NOTE — Telephone Encounter (Signed)
I attempted to reach the pt but did not get an answer. Per the pt's chart he is taking Simvastatin 20mg  daily.

## 2012-09-30 NOTE — Telephone Encounter (Signed)
New problem    Patient saw Dr. Arbutus Ped  today. He recommend that medication be reduce base on blood test. Simvastatin  40 mg . Dr. Arbutus Ped  is requesting  10 mg .

## 2012-09-30 NOTE — Telephone Encounter (Signed)
Scheduled BM bx with carter in flow cytometry for march 7th

## 2012-09-30 NOTE — Progress Notes (Signed)
[facility] Telephone:([phone]   Fax:([phone]) [phone]  CONSULT NOTE  REFERRING PHYSICIAN: Dr. Tonny Bollman.  REASON FOR CONSULTATION:  72 years old white male for evaluation of thrombocytopenia  HPI [name] is a 72 y.o. malewas past medical history significant for coronary artery disease status post stent placement in 2008, hypertension, dyslipidemia, diabetes mellitus as well as history of ankylosing spondylosis. The patient was seen recently by his cardiologist Dr. Excell Seltzer and routine blood work on 09/02/2012 showed low platelets count of 83,000. Previous CBCs over the last 2 years showed persistent thrombocytopenia with platelets count ranging between 74,000-118,000. The patient was referred to me today for further evaluation and recommendation regarding his thrombocytopenia. He has been on treatment with statins since 2008 and at least a few years with Zocor which was reduced from 40 mg to 20 mg recently. He was also on Niaspan which was discontinued. The patient is also currently on 5-FU cream for skin lesions and was treated for a duration of 6 weeks over to course 3 weeks each. He is feeling fine today with no specific complaints. He denied having any significant bleeding, bruises or ecchymosis. He has no weight loss or night sweats. The patient denied having any significant chest pain, shortness breath, cough or hemoptysis. His family history is unremarkable for any blood disease. The patient is married and has 3 children, 2 living. He works as a Ecologist. He has no history of smoking but drinks alcohol occasionally and no history of drug abuse. @SFHPI @  Past Medical History  Diagnosis Date  . Coronary atherosclerosis of native coronary artery   . Hyperlipidemia     with low HDL  . Ankylosing spondylitis   . Hypertension   . Allergic rhinitis   . Cough   . Abnormal chest x-ray   . Colonic polyp   . Hemorrhoids   . Rotator cuff tear   .  Ganglion cyst of wrist     left  . Other disorder of muscle, ligament, and fascia   . Anxiety   . History of malaria     Past Surgical History  Procedure Laterality Date  . Rotator cuff repair      bilat by Dr. Madelon Lips  . Hemorrhoid surgery  10/10    in [place]    Family History  Problem Relation Age of Onset  . Cancer Mother     melanoma  . Emphysema Mother   . Rheum arthritis Mother   . Diabetes Mother     Social History History  Substance Use Topics  . Smoking status: Never Smoker   . Smokeless tobacco: Never Used  . Alcohol Use: No     Comment: occ 1 beer, wine occ    No Known Allergies  Current Outpatient Prescriptions  Medication Sig Dispense Refill  . aspirin 81 MG EC tablet Take 81 mg by mouth daily.        . carvedilol (COREG) 3.125 MG tablet Take 3.125 mg by mouth 2 (two) times daily with a meal.      . fluorouracil (EFUDEX) 5 % cream Apply 1 application topically 2 (two) times daily.      . simvastatin (ZOCOR) 40 MG tablet Take 20 mg by mouth every evening.       No current facility-administered medications for this visit.    Review of Systems  A comprehensive review of systems was negative.  Physical Exam  AVW:UJWJX, healthy, no distress, well nourished and well  developed SKIN: skin color, texture, turgor are normal HEAD: Normocephalic, No masses, lesions, tenderness or abnormalities EYES: normal, PERRLA EARS: External ears normal OROPHARYNX:no exudate and no erythema  NECK: supple, no adenopathy LYMPH:  no palpable lymphadenopathy, no hepatosplenomegaly LUNGS: clear to auscultation  HEART: regular rate & rhythm, no murmurs and no gallops ABDOMEN:abdomen soft, non-tender, normal bowel sounds and no masses or organomegaly BACK: Back symmetric, no curvature. EXTREMITIES:no joint deformities, effusion, or inflammation, no edema, no skin discoloration, no clubbing  NEURO: alert & oriented x 3 with fluent speech, no focal motor/sensory  deficits  PERFORMANCE STATUS: ECOG 0  LABORATORY DATA: Lab Results  Component Value Date   WBC 2.9* 09/30/2012   HGB 13.5 09/30/2012   HCT 39.9 09/30/2012   MCV 91.3 09/30/2012   PLT 66* 09/30/2012      Chemistry      Component Value Date/Time   NA 142 09/30/2012 0949   NA 141 09/02/2012 1313   K 4.0 09/30/2012 0949   K 4.2 09/02/2012 1313   CL 106 09/30/2012 0949   CL 106 09/02/2012 1313   CO2 27 09/30/2012 0949   CO2 27 09/02/2012 1313   BUN 19.5 09/30/2012 0949   BUN 17 09/02/2012 1313   CREATININE 1.0 09/30/2012 0949   CREATININE 1.0 09/02/2012 1313      Component Value Date/Time   CALCIUM 9.4 09/30/2012 0949   CALCIUM 9.4 09/02/2012 1313   ALKPHOS 60 09/30/2012 0949   ALKPHOS 53 08/03/2012 1019   AST 26 09/30/2012 0949   AST 42* 08/03/2012 1019   ALT 40 09/30/2012 0949   ALT 62* 08/03/2012 1019   BILITOT 0.59 09/30/2012 0949   BILITOT 1.1 08/03/2012 1019       RADIOGRAPHIC STUDIES: No results found.  ASSESSMENT: this is a very pleasant 72 years old white male who presented for evaluation of persistent thrombocytopenia most likely ITP plus/minus drug-induced related to his statin treatment. The patient was also found today to have Leukocytopenia and neutropenia on his CBC which is again likely to be drug-induced, but I cannot rule out any other bone marrow abnormality.  PLAN: I had a lengthy discussion with the patient today about his condition. He is currently asymptomatic. I gave him the option of observation and repeat CBC in 2 months versus proceeding with a bone marrow biopsy and aspirate to rule out any bone marrow abnormality. The patient would like to proceed with a bone marrow biopsy and aspirate at this point. I will arrange for this procedure to be performed in the next 2 weeks. I would see him back for followup visit in 3 weeks for evaluation and discussion of his biopsy results. I would also discuss with Dr. Excell Seltzer reducing his dose of simvastatin to 10 mg by mouth  daily to see if this will reverse his condition. The patient agreed to the current plan. He was advised to call immediately if he has any concerning symptoms in the interval.  All questions were answered. The patient knows to call the clinic with any problems, questions or concerns. We can certainly see the patient much sooner if necessary.  Thank you so much for allowing me to participate in the care of [name]. I will continue to follow up the patient with you and assist in his care.  I spent 25 minutes counseling the patient face to face. The total time spent in the appointment was 50 minutes.  Raef Sprigg K. 09/30/2012, 11:25 PM

## 2012-10-02 DIAGNOSIS — L259 Unspecified contact dermatitis, unspecified cause: Secondary | ICD-10-CM | POA: Diagnosis not present

## 2012-10-06 NOTE — Telephone Encounter (Signed)
Per Dr Excell Seltzer the pt is okay to decrease Simvastatin dosage to 10mg  daily per Dr Asa Lente recommendation.  I left the pt a message to call our office.

## 2012-10-08 NOTE — Telephone Encounter (Signed)
Follow Up    Returning phone call from a few days ago. Would like to speak to nurse.

## 2012-10-08 NOTE — Telephone Encounter (Signed)
Pt rtn call 

## 2012-10-08 NOTE — Telephone Encounter (Signed)
Left message to call back  

## 2012-10-08 NOTE — Telephone Encounter (Signed)
Pt is aware that Dr. Excell Seltzer agrees to decreased the Simvastatin medication to 10 mg once a day as recommended by Dr. Shirline Frees. Pt aware.

## 2012-10-09 ENCOUNTER — Other Ambulatory Visit: Payer: Self-pay

## 2012-10-09 MED ORDER — SIMVASTATIN 10 MG PO TABS: 10.0000 mg | ORAL_TABLET | Freq: Every evening | ORAL | Status: DC

## 2012-10-13 ENCOUNTER — Other Ambulatory Visit (HOSPITAL_COMMUNITY): Payer: Self-pay | Admitting: Internal Medicine

## 2012-10-13 ENCOUNTER — Encounter (HOSPITAL_COMMUNITY): Payer: Self-pay | Admitting: Pharmacy Technician

## 2012-10-16 ENCOUNTER — Ambulatory Visit (HOSPITAL_COMMUNITY)
Admission: RE | Admit: 2012-10-16 | Discharge: 2012-10-16 | Disposition: A | Discharge status: Home / Self Care | Payer: Medicare Other | Source: Ambulatory Visit | Attending: Internal Medicine | Admitting: Internal Medicine

## 2012-10-16 ENCOUNTER — Encounter (HOSPITAL_COMMUNITY): Payer: Self-pay

## 2012-10-16 VITALS — BP 119/75 | HR 54 | Temp 98.1°F | Resp 16 | Ht 66.0 in | Wt 186.0 lb

## 2012-10-16 DIAGNOSIS — I2510 Atherosclerotic heart disease of native coronary artery without angina pectoris: Secondary | ICD-10-CM | POA: Diagnosis not present

## 2012-10-16 DIAGNOSIS — E119 Type 2 diabetes mellitus without complications: Secondary | ICD-10-CM | POA: Diagnosis not present

## 2012-10-16 DIAGNOSIS — E785 Hyperlipidemia, unspecified: Secondary | ICD-10-CM | POA: Diagnosis not present

## 2012-10-16 DIAGNOSIS — D72821 Monocytosis (symptomatic): Secondary | ICD-10-CM | POA: Diagnosis not present

## 2012-10-16 DIAGNOSIS — Z7982 Long term (current) use of aspirin: Secondary | ICD-10-CM | POA: Insufficient documentation

## 2012-10-16 DIAGNOSIS — Z79899 Other long term (current) drug therapy: Secondary | ICD-10-CM | POA: Diagnosis not present

## 2012-10-16 DIAGNOSIS — D696 Thrombocytopenia, unspecified: Secondary | ICD-10-CM

## 2012-10-16 DIAGNOSIS — D72819 Decreased white blood cell count, unspecified: Secondary | ICD-10-CM | POA: Diagnosis not present

## 2012-10-16 DIAGNOSIS — I10 Essential (primary) hypertension: Secondary | ICD-10-CM | POA: Diagnosis not present

## 2012-10-16 LAB — CBC WITH DIFFERENTIAL/PLATELET
Basophils Absolute: 0 10*3/uL (ref 0.0–0.1)
Basophils Relative: 0 % (ref 0–1)
Eosinophils Absolute: 0 10*3/uL (ref 0.0–0.7)
HCT: 43.5 % (ref 39.0–52.0)
Lymphs Abs: 1.1 10*3/uL (ref 0.7–4.0)
MCHC: 34.3 g/dL (ref 30.0–36.0)
MCV: 90.8 fL (ref 78.0–100.0)
Monocytes Relative: 25 % — ABNORMAL HIGH (ref 3–12)
Neutro Abs: 2 10*3/uL (ref 1.7–7.7)
RDW: 14.4 % (ref 11.5–15.5)

## 2012-10-16 MED ORDER — MIDAZOLAM HCL 10 MG/2ML IJ SOLN
INTRAMUSCULAR | Status: AC
  Administered 2012-10-16: 2 mg
  Filled 2012-10-16: qty 2

## 2012-10-16 MED ORDER — SODIUM CHLORIDE 0.9 % IV SOLN
Freq: Once | INTRAVENOUS | Status: AC
  Administered 2012-10-16: 20 mL/h via INTRAVENOUS

## 2012-10-16 MED ORDER — MEPERIDINE HCL 50 MG/ML IJ SOLN
INTRAMUSCULAR | Status: AC
  Filled 2012-10-16: qty 1

## 2012-10-16 NOTE — ED Notes (Signed)
Vital signs stable. 

## 2012-10-16 NOTE — ED Notes (Signed)
Family updated as to patient's status.

## 2012-10-16 NOTE — ED Notes (Signed)
Patient denies pain and is resting comfortably.  

## 2012-10-16 NOTE — Sedation Documentation (Signed)
Medication dose calculated and verified for Zakariyah Dechellis

## 2012-10-16 NOTE — ED Notes (Signed)
dsg cdi 

## 2012-10-16 NOTE — Procedures (Signed)
Bone Marrow Biopsy and Aspiration Procedure Note  Mallampati's class: 1 ASA class: 1 Informed consent was obtained and potential risks including bleeding, infection and pain were reviewed with the patient.   Posterior iliac crest(s) prepped with Betadine.   Lidocaine 2% local anesthesia infiltrated into the subcutaneous tissue.  Left bone marrow biopsy and left bone marrow aspirate was obtained.   The procedure was tolerated well and there were no complications.  Specimens sent for: routine histopathologic stains and sectioning, flow cytometry and cytogenetics  Physician: Lajuana Matte.

## 2012-10-16 NOTE — ED Notes (Addendum)
dsg applied to sacral area  cdi   md has finished  Procedure, will continue to monitor pt

## 2012-10-21 ENCOUNTER — Other Ambulatory Visit (HOSPITAL_BASED_OUTPATIENT_CLINIC_OR_DEPARTMENT_OTHER): Payer: Medicare Other | Admitting: Lab

## 2012-10-21 ENCOUNTER — Encounter: Payer: Self-pay | Admitting: Internal Medicine

## 2012-10-21 ENCOUNTER — Ambulatory Visit (HOSPITAL_BASED_OUTPATIENT_CLINIC_OR_DEPARTMENT_OTHER): Payer: Medicare Other | Admitting: Internal Medicine

## 2012-10-21 ENCOUNTER — Telehealth: Payer: Self-pay | Admitting: Internal Medicine

## 2012-10-21 VITALS — BP 132/78 | HR 59 | Temp 97.3°F | Resp 20 | Ht 66.0 in | Wt 186.3 lb

## 2012-10-21 DIAGNOSIS — D696 Thrombocytopenia, unspecified: Secondary | ICD-10-CM

## 2012-10-21 DIAGNOSIS — B079 Viral wart, unspecified: Secondary | ICD-10-CM | POA: Diagnosis not present

## 2012-10-21 LAB — CBC WITH DIFFERENTIAL/PLATELET
Eosinophils Absolute: 0 10*3/uL (ref 0.0–0.5)
HCT: 42.4 % (ref 38.4–49.9)
LYMPH%: 34.1 % (ref 14.0–49.0)
MCV: 90.1 fL (ref 79.3–98.0)
MONO%: 13.1 % (ref 0.0–14.0)
NEUT#: 1.9 10*3/uL (ref 1.5–6.5)
NEUT%: 51 % (ref 39.0–75.0)
Platelets: 61 10*3/uL — ABNORMAL LOW (ref 140–400)
RBC: 4.71 10*6/uL (ref 4.20–5.82)

## 2012-10-21 NOTE — Progress Notes (Signed)
[facility] [phone]   [phone]  OFFICE PROGRESS NOTE  Michele Mcalpine, MD [address] [place] [address]  DIAGNOSIS: Thrombocytopenia, most likely idiopathic thrombocytopenic purpura.  PRIOR THERAPY:None.  CURRENT THERAPY: observation.  INTERVAL HISTORY: [name] 72 y.o. male returns to the clinic today for followup visit accompanied his wife. The patient is feeling fine today with Jordan specific complaints. He denied having any significant bleeding issues, bruises or ecchymosis. The patient has Jordan chest pain, shortness breath, cough or hemoptysis. He denied having any significant weight loss or night sweats. He had bone marrow biopsy and aspirate performed recently and he is here for evaluation and discussion of his biopsy results.  MEDICAL HISTORY: Past Medical History  Diagnosis Date  . Coronary atherosclerosis of native coronary artery   . Hyperlipidemia     with low HDL  . Ankylosing spondylitis   . Hypertension   . Allergic rhinitis   . Cough   . Abnormal chest x-ray   . Colonic polyp   . Hemorrhoids   . Rotator cuff tear   . Ganglion cyst of wrist     left  . Other disorder of muscle, ligament, and fascia   . Anxiety   . History of malaria     ALLERGIES:  has Jordan Known Allergies.  MEDICATIONS:  Current Outpatient Prescriptions  Medication Sig Dispense Refill  . aspirin 81 MG EC tablet Take 81 mg by mouth at bedtime.       . carvedilol (COREG) 3.125 MG tablet Take 3.125 mg by mouth 2 (two) times daily with a meal.      . fluorouracil (EFUDEX) 5 % cream Apply 1 application topically 2 (two) times daily. Apply to growth      . simvastatin (ZOCOR) 10 MG tablet Take 10 mg by mouth at bedtime.       Jordan current facility-administered medications for this visit.    SURGICAL HISTORY:  Past Surgical History  Procedure Laterality Date  . Rotator cuff repair      bilat by Dr. Madelon Lips  . Hemorrhoid surgery  10/10    in [place]    REVIEW OF SYSTEMS:  A comprehensive review of systems was negative.   PHYSICAL EXAMINATION: General appearance: alert, cooperative and Jordan distress Head: Normocephalic, without obvious abnormality, atraumatic Neck: Jordan adenopathy Lymph nodes: Cervical, supraclavicular, and axillary nodes normal. Resp: clear to auscultation bilaterally Cardio: regular rate and rhythm, S1, S2 normal, Jordan murmur, click, rub or gallop GI: soft, non-tender; bowel sounds normal; Jordan masses,  Jordan organomegaly Extremities: extremities normal, atraumatic, Jordan cyanosis or edema  ECOG PERFORMANCE STATUS: 0 - Asymptomatic  Blood pressure 132/78, pulse 59, temperature 97.3 F (36.3 C), temperature source Oral, resp. rate 20, height 5\' 6"  (1.676 m), weight 186 lb 4.8 oz (84.505 kg).  LABORATORY DATA: Lab Results  Component Value Date   WBC 3.8* 10/21/2012   HGB 14.5 10/21/2012   HCT 42.4 10/21/2012   MCV 90.1 10/21/2012   PLT 61* 10/21/2012      Chemistry      Component Value Date/Time   NA 142 09/30/2012 0949   NA 141 09/02/2012 1313   K 4.0 09/30/2012 0949   K 4.2 09/02/2012 1313   CL 106 09/30/2012 0949   CL 106 09/02/2012 1313   CO2 27 09/30/2012 0949   CO2 27 09/02/2012 1313   BUN 19.5 09/30/2012 0949   BUN 17 09/02/2012 1313   CREATININE  1.0 09/30/2012 0949   CREATININE 1.0 09/02/2012 1313      Component Value Date/Time   CALCIUM 9.4 09/30/2012 0949   CALCIUM 9.4 09/02/2012 1313   ALKPHOS 60 09/30/2012 0949   ALKPHOS 53 08/03/2012 1019   AST 26 09/30/2012 0949   AST 42* 08/03/2012 1019   ALT 40 09/30/2012 0949   ALT 62* 08/03/2012 1019   BILITOT 0.59 09/30/2012 0949   BILITOT 1.1 08/03/2012 1019     BONE MARROW REPORT FINAL DIAGNOSIS Diagnosis Bone Marrow, Aspirate and Biopsy, left iliac - HYPERCELLULAR BONE MARROW FOR AGE WITH TRILINEAGE HEMATOPOIESIS INCLUDING ABUNDANT MEGAKARYOCYTES. - SEE COMMENT. PERIPHERAL BLOOD: - MONOCYTOSIS. - THROMBOCYTOPENIA. Diagnosis Note The bone marrow is  hypercellular with trilineage hematopoiesis including abundant morphologically normal megakaryocytes. Significant dyspoietic changes are not present. Given the peripheral blood findings, the overall features are considered non specific but a secondary process is favored. Correlation with cytogenetic studies is recommended. (BNS:gt, 10/26/12) [name] MD Pathologist, Electronic Signature (Case signed 2012/10/26)  RADIOGRAPHIC STUDIES: Jordan results found.  ASSESSMENT: this is a very pleasant 72 years old white male with thrombocytopenia most likely idiopathic thrombocytopenic purpura. The patient is currently asymptomatic. His bone marrow biopsy and aspirate showed Jordan significant abnormality.   PLAN: I discussed the biopsy results with the patient and his wife. I recommended for him to continue on observation for now as he is currently asymptomatic and his platelets count to over 50,000. I would see him back for followup visit in 6 months with repeat CBC, comprehensive metabolic panel and LDH. I advised the patient to call me immediately if he has any significant bleeding issues, bruises or ecchymosis or if his platelets count to be less than 50,000. The patient and his wife agreed to the current plan  All questions were answered. The patient knows to call the clinic with any problems, questions or concerns. We can certainly see the patient much sooner if necessary.

## 2012-10-21 NOTE — Patient Instructions (Signed)
The bone marrow biopsy and aspirate showed no significant abnormalities. You are currently asymptomatic from the low platelets count. I will continue on observation for now with repeat CBC in six-months. Please call if you have any significant bleeding issues, bruises or ecchymosis.

## 2012-10-21 NOTE — Telephone Encounter (Signed)
Gave pt appt for lab and MD on September 2014 °

## 2012-10-23 ENCOUNTER — Ambulatory Visit (INDEPENDENT_AMBULATORY_CARE_PROVIDER_SITE_OTHER): Payer: Medicare Other | Admitting: Pulmonary Disease

## 2012-10-23 ENCOUNTER — Encounter: Payer: Self-pay | Admitting: Pulmonary Disease

## 2012-10-23 ENCOUNTER — Other Ambulatory Visit (INDEPENDENT_AMBULATORY_CARE_PROVIDER_SITE_OTHER): Payer: Medicare Other

## 2012-10-23 VITALS — BP 116/70 | HR 50 | Temp 98.1°F | Ht 68.0 in | Wt 188.6 lb

## 2012-10-23 DIAGNOSIS — R7309 Other abnormal glucose: Secondary | ICD-10-CM | POA: Diagnosis not present

## 2012-10-23 DIAGNOSIS — E7800 Pure hypercholesterolemia, unspecified: Secondary | ICD-10-CM

## 2012-10-23 DIAGNOSIS — M459 Ankylosing spondylitis of unspecified sites in spine: Secondary | ICD-10-CM

## 2012-10-23 DIAGNOSIS — D696 Thrombocytopenia, unspecified: Secondary | ICD-10-CM

## 2012-10-23 DIAGNOSIS — F411 Generalized anxiety disorder: Secondary | ICD-10-CM

## 2012-10-23 DIAGNOSIS — I2510 Atherosclerotic heart disease of native coronary artery without angina pectoris: Secondary | ICD-10-CM

## 2012-10-23 DIAGNOSIS — D126 Benign neoplasm of colon, unspecified: Secondary | ICD-10-CM | POA: Diagnosis not present

## 2012-10-23 LAB — HEMOGLOBIN A1C: Hgb A1c MFr Bld: 5.4 % (ref 4.6–6.5)

## 2012-10-23 NOTE — Patient Instructions (Addendum)
Today we updated your med list in our EPIC system...    Continue your current medications the same...  Today we did an A1c blood test (long term sugar checker) & we will contact you w/ the result...    In the meanwhile you should avoid sugars, sweets, pastas, breads, etc...    Strive to eat foods w/ a LOW Glycemic Index...  Stay active & work on weight reduction...  Call for any questions...  Let's plan a follow up visit in 6 months, sooner if needed for problems.Marland KitchenMarland Kitchen

## 2012-10-23 NOTE — Progress Notes (Signed)
Subjective:    Patient ID: [name], male    DOB: 1940/10/16, 72 y.o.   MRN: [id number]  HPI 72 y/o WM here for an ROV... he has mult medical problems & is followed regularly by DrBBrodie for Cards w/ hx of CAD, s/p AWMI w/ VFib arrest, subseq PTCA/ stent 1/08... he also has Hypercholesterolemia & [facility] the [facility] on Simvastatin & Niaspan...  ~  June 04, 2011:  69mo ROV & he is c/o arthritis pains off & on, using OTC analgesics w/ improvement & it's tolerable; still followed in the [facility] Clinic & taking Simva40- 1/2 tab daily w/ recent FLP looking good;  Known CAD, AWMI, etc & awaiting appt w/ DrCooper set up for 07/01/11> pt denies CP, palpit, SOB, or edema;  He has DJD, ankylosing spondy, hx bilat rotator cuff repairs & ganglion on left wrist> he is using a TENS unit he bought for himself on-line, plus OTC analgesics as needed...  ~  June 01, 2012:  Yearly ROV & review> [name] tends to use the physicians at the [facility] for his primary care needs & avail records indicate visits for tinea pedis, infected insect bite, cerumen impaction, etc...  We reviewed the following medical problems during today's yearly office visit>>     AR/ Cough/ AbnCXR> he has mild basilar fibrosis, nonprogressive, & sl elev right hemidiaph; stable- no change...    CAD> on ASA81, Coreg3.125Bid; s/p AWMI 2008 w/ PCI to LAD; followed by DrCooper, Cards- last seen 11/12, asymptomatic, stable, no changes made; he denies CP, palpit, dizzy, SOB, edema; he's been too sedentary and about to restart an exercise program...    Chol> on Simva40-1/2 daily; FLP shows TChol 124, TG 104, HDL 38, LDL 66    Colon polyps, Hems> doing satis- denies abd pain, n/v, c/d, blood seen; last colon was 2007 by DrPeters in [place] & f/u planned 33yrs.    Elev LFTs> routine labs showed sl incr SGOT/ SGPT and Ultrasound showed prob fatty infiltration; rec weight reduction, diet Rx...    Ortho> Ankylosing Spondy> he is HLA B27 pos, hx rotator  cuff tears w/ bilat surg, ganglion cyst surg; using OTC meds as needed...     Anxiety> under stress, wife has had 3 operations- right knee, hammer toes, etc... We reviewed prob list, meds, xrays and labs> see below for updates >>  CXR 10/13 showed normal heart size, clear lungs, chr elev right hemidiaph, DJD in Tspine... Abd Sonar 10/13 showed normal GB, heterogeneous liver w/ incr echotexture c/w fatty infiltration, sm right renal cyst... LABS 10/13:  FLP- at goals on Simva20;  Chems- wnl x sl incr LFTs;  CBC- wnl x low plat at 76K;  TSH=2.62;  PSA=1.43   ~  October 23, 2012:  69mo ROV & [name] has noted some elev BSs- home checks betw 100-140, and lab today shows A1c=5.4; we discussed "Impaired fasting glucose" and treatment w/ low carb diet & wt reduction... We reviewed the following medical problems during today's office visit >>     AR/ Cough/ AbnCXR> he has mild basilar fibrosis, nonprogressive, & sl elev right hemidiaph; stable- no change...    CAD> on ASA81, Coreg3.125Bid; s/p AWMI 2008 w/ PCI to LAD; followed by DrCooper- he had Treadmill 1/14 "I failed it" w/ hypertensive BP response; then Cath w/ single vessel dis in LAD & branch, & lower level blockages in RCA/ Circ/ & normal LVF...    Chol> on Simva40-1/2 daily- recently decr further to 10mg /d by DrMohammed  to see if his platelets improve; FLP 10/13 shows TChol 124, TG 104, HDL 38, LDL 66    Colon polyps, Hems> doing satis- denies abd pain, n/v, c/d, blood seen; last colon was 2007 by DrPeters in [place] & f/u planned 83yrs.    Elev LFTs> routine labs showed sl incr SGOT/ SGPT and Ultrasound showed prob fatty infiltration; rec weight reduction, diet Rx...    Ortho> Ankylosing Spondy> he is HLA B27 pos, hx rotator cuff tears w/ bilat surg, ganglion cyst surg; notes some LBP w/ walking; using OTC meds as needed...     Anxiety> under stress, wife has had 3 operations- right knee, hammer toes, etc...    Thrombocytopenia> ?idiopathic, not assoc w/  bleeding diathesis; eval by DrMohammed & on Observation... We reviewed prob list, meds, xrays and labs> see below for updates >> he had the 2013 flu vaccine 10/13;  States he had Shingles vaccine prev ?date? LABS 2-3-4/14: Chems- wnl x BS=198;  CBC- Hg=13.5, WBC=2.9, Plat=66K => repeats 61-73... LABS 3/14:  A1c=5.4.Marland KitchenMarland Kitchen          Problem List:    ALLERGIC RHINITIS (ICD-477.9) - hx AR w/ +allergy testing to ragweed & dust in the past.  COUGH (ICD-786.2) - eval 3/11 for dry irritative cough, on Enalapril, & cough resolved off ACE Rx.  Hx of ABNORMAL CHEST XRAY (ICD-793.1) - hx mild basilar pulm fibrosis on old CXR/ CT scan in 2002... there is also mild nonprogressive elevation of the right hemidiaphragm on old films... ~  CXR 10/13 showed normal heart size, clear lungs, chr elev right hemidiaph, DJD in Tspine...  CAD, NATIVE VESSEL (ICD-414.01) - he had an AWMI 1/08 w/ VFib arrest, successful resus, urgent cath w/ PTCA/ stent to LAD by DrBBrodie... on ASA 81mg /d, & CARVEDILOL 3.125mg Bid... prev Enalapril stopped due to cough & switched to Benicar> pt asked to stop Benicar due to BP= 100/54, weak/ no energy etc...  denies CP, palpit, syncope, dyspnea, swelling, etc... ~  2DEcho 3/08 showed mild septal HK, w/ LVF at lower limit of norm. ~  Myoview 8/09 showed ? sm inferior MI vs diaph attenuation, no ischemia, EF= 56%. ~  EKG 12/12 showed SBrady, rate59, poor R progression V1-2... ~  Cardiac Cath 1/14 showed single vessel CAD> patent LAD stent & 20-30% in mid vessel & diagonal branch had 75% stenosis in mid-vessel; RCA had 40-50% midRCA stenosis; Circ w/ 20-30% irregularities; normal LVF; DrCooper Rec medical management...  HYPERCHOLESTEROLEMIA (ICD-272.0) - followed in the [facility] Clinic on SIMVASTATIN 40mg - 1/2 tab daily & NIASPAN 500mg - 2/d... ~  FLP 10/10 on Simva40+Niasp1000 showed TChol 94, TG 44, HDL 36, LDL 49... keep same Rx. ~  FLP 4/11 on Simva40+Niasp1000 showed TChol 61, TG 44, LDL 28,  LDL 25... pt rec to decr to Simva20. ~  FLP 9/11 on Simva20+Niasp1000 showed TChol 103, TG 60, HDL 35, LDL 56... [facility] decr the Niaspan to 1/d. ~  4/12:  Lipid Clinic discontinued his Niacin Rx... ~  FLP 2/13 on simva20 showed TChol 114, TG 63, HDL 42, LDL 60 ~  FLP 10/13 on Simva20 showed TChol 124, TG 104, HDL 38, LDL 66  IMPAIRED FASTING GLUCOSE >> on low carb diet & needs to lose weight... ~  2014: BS as high as 200 but home BS checks ave 100-140; A1c done 3/14 = 5.4 & he is reassured, diet rx alone...  COLONIC POLYPS (ICD-211.3) ~  colonoscopy 11/02 by Dorris Singh showed divertics, hems, otherw neg. ~  colonoscopy in [place]  DrPeters 10/07 w/ 1 hyperplastic polyp removed> f/u planned 50yrs.  HEMORRHOIDS (ICD-455.6) - he reports hemorrhoid surg 10/10 in [place]...  Prob mild FATTY LIVER DISEASE >> rec to get on diet, get wt down...  ~  Abd Sonar 10/13 showed normal gallbladder, normal ducts, sl incr liver echotexture may reflect fatty infiltration; 12mm lower pole right renal cyst...   ANKYLOSING SPONDYLITIS (ICD-720.0) - eval for Ortho DrMurphy, & Rheum DrDeveshwar w/ Dx of HLA B-27 pos Ankylosing Spondylitis> w/ mild morning stiffness & back pain prev treated w/ Celebrex=> pt stopped it because "it causes heart attacks"... Hx of ROTATOR CUFF TEAR (ICD-727.61) - hx bilat rotator cuff repairs by DrCaffrey GANGLION CYST, WRIST, LEFT (ICD-727.41) - new prob 4/11 & he will check w/ DrMurphy...  Hx of OTHER DISORDER OF MUSCLE LIGAMENT AND FASCIA (ICD-728.89) - he had a spont hemorrhage into his right thigh 1996  ANXIETY (ICD-300.00) - his son was killed in 1998  Hx of MALARIA (ICD-084.6) - he has malaria while in the service  LOW PLATELET COUNT >> ?etiology- Plats=76K & range 74-118, no bleeding diathesis, bruising, etc... ~  Abd Sonar 10/13 showed prob mild fatty liver & normal appearing spleen... ~  2/14: consult by DrMohammed for Heme/Onc => he did Bone Marrow Bx- hypercellular marrow w/  abundant megakaryocytes; Heme felt he prob has idiopathic thrombocytopathic purpura & Rec observation status...  HEALTH MAINTENANCE: ~  GI:  followed by DrPeters in [place] & up to date on colonoscopies... ~  GU:  exam is neg and PSA= 2.64 ~  Immuniz:  he gets the Flu vaccine each Fall... had PNEUMOVAX in 2010 at age 33.   Past Surgical History  Procedure Laterality Date  . Rotator cuff repair      bilat by Dr. Madelon Lips  . Hemorrhoid surgery  10/10    in [place]    Outpatient Encounter Prescriptions as of 10/23/2012  Medication Sig Dispense Refill  . aspirin 81 MG EC tablet Take 81 mg by mouth at bedtime.       . carvedilol (COREG) 3.125 MG tablet Take 3.125 mg by mouth 2 (two) times daily with a meal.      . simvastatin (ZOCOR) 10 MG tablet Take 10 mg by mouth at bedtime.      . [DISCONTINUED] fluorouracil (EFUDEX) 5 % cream Apply 1 application topically 2 (two) times daily. Apply to growth       No facility-administered encounter medications on file as of 10/23/2012.    No Known Allergies   Current Medications, Allergies, Past Medical History, Past Surgical History, Family History, and Social History were reviewed in [employer] record.     Review of Systems    The patient denies fever, chills, sweats, anorexia, fatigue, weakness, malaise, weight loss, sleep disorder, blurring, diplopia, eye irritation, eye discharge, vision loss, eye pain, photophobia, earache, ear discharge, tinnitus, decreased hearing, nasal congestion, nosebleeds, sore throat, hoarseness, chest pain, palpitations, syncope, dyspnea on exertion, orthopnea, PND, peripheral edema, cough, dyspnea at rest, excessive sputum, hemoptysis, wheezing, pleurisy, nausea, vomiting, diarrhea, constipation, change in bowel habits, abdominal pain, melena, hematochezia, jaundice, gas/bloating, indigestion/heartburn, dysphagia, odynophagia, dysuria, hematuria, urinary frequency, urinary hesitancy, nocturia,  incontinence, back pain, joint pain, joint swelling, muscle cramps, muscle weakness, stiffness, arthritis, sciatica, restless legs, leg pain at night, leg pain with exertion, rash, itching, dryness, suspicious lesions, paralysis, paresthesias, seizures, tremors, vertigo, transient blindness, frequent falls, frequent headaches, difficulty walking, depression, anxiety, memory loss, confusion, cold intolerance, heat intolerance, polydipsia, polyphagia, polyuria,  unusual weight change, abnormal bruising, bleeding, enlarged lymph nodes, urticaria, allergic rash, hay fever, and recurrent infections.     Objective:   Physical Exam      WD, WN, 72 y/o WM in NAD... GENERAL:  Alert & oriented; pleasant & cooperative... HEENT:  [place]/AT, EOM-wnl, PERRLA, EACs-clear, TMs-wnl, NOSE-clear, THROAT-clear & wnl. NECK:  Supple w/ fairROM; no JVD; normal carotid impulses w/o bruits; no thyromegaly or nodules palpated; no lymphadenopathy. CHEST:  Clear to P & A; without wheezes/ rales/ or rhonchi. HEART:  Regular Rhythm; without murmurs/ rubs/ or gallops. ABDOMEN:  Soft & nontender; normal bowel sounds; no organomegaly or masses detected. RECTAL:  prostate 3+ smooth w/o nodules, rectal neg- stool heme neg. EXT: without deformities, mild arthritic changes; no varicose veins/ venous insuffic/ or edema. NEURO:  CN's intact; motor testing normal; sensory testing normal; gait normal & balance OK. DERM:  No lesions noted; no rash etc...  RADIOLOGY DATA:  Reviewed in the EPIC EMR & discussed w/ the patient...  LABORATORY DATA:  Reviewed in the EPIC EMR & discussed w/ the patient...   Assessment & Plan:    CAD>  followed by DrCooper & stable post cath 1/14 on ASA, BBlocker, Statin;  Continue Rx + diet & exercise...  CHOL>  Prev followed in the [facility] Clinic & they stopped his Niacin, on Sima20 w/ good FLP; DrMohammed decr the dose to 10mg  to check effect on plat ct.  GI> stable & up to date...  ORTHO>  Hx AS, etc>  he uses Tylenol for pain & avoids NSAIDs as he thinks the Celebrex may have caused his MI...  Thrombocytopenia> low platelet counts w/o bleeding diathesis; Neg bone marrow, on observation....  Other medical issues as noted...   Patient's Medications  New Prescriptions   No medications on file  Previous Medications   ASPIRIN 81 MG EC TABLET    Take 81 mg by mouth at bedtime.    CARVEDILOL (COREG) 3.125 MG TABLET    Take 3.125 mg by mouth 2 (two) times daily with a meal.   SIMVASTATIN (ZOCOR) 10 MG TABLET    Take 10 mg by mouth at bedtime.  Modified Medications   No medications on file  Discontinued Medications   FLUOROURACIL (EFUDEX) 5 % CREAM    Apply 1 application topically 2 (two) times daily. Apply to growth

## 2012-11-26 ENCOUNTER — Telehealth: Payer: Self-pay | Admitting: *Deleted

## 2012-11-26 NOTE — Telephone Encounter (Signed)
Pt called left msg wanting to know if he can increase his simvastatin back to 20mg .  Dr Donnald Garre had decareased dose to 10mg  to see if this would help with his low platelets.  Called and left msg that Dr Donnald Garre is out of the office and will leave Dr Donnald Garre a message and all back 4/21.  SLJ

## 2012-12-27 ENCOUNTER — Other Ambulatory Visit: Payer: Self-pay | Admitting: Cardiovascular Disease

## 2012-12-28 ENCOUNTER — Telehealth: Payer: Self-pay | Admitting: *Deleted

## 2012-12-28 DIAGNOSIS — E7800 Pure hypercholesterolemia, unspecified: Secondary | ICD-10-CM

## 2012-12-28 MED ORDER — SIMVASTATIN 10 MG PO TABS: 20.0000 mg | ORAL_TABLET | Freq: Every day | ORAL | Status: DC

## 2012-12-28 NOTE — Telephone Encounter (Signed)
Patient called asking for clarification of the simvastatin.  Was instructed to decrease dose to 10 mg.  Saw Dr. Kriste Basque and was told to inquire because he should be taking the 20 mg dose.  Says he doesn't know why dose was reduced.  Informed him the low platelet count is why it was reduced.  Bone Marrow performed did not show any abnormality.  Notified Dr. Arbutus Ped of patient's concern.  Verbal order received and read back for patient to resume simvastatin 20 mg dose.  Notified patient with this call.

## 2012-12-29 ENCOUNTER — Other Ambulatory Visit: Payer: Self-pay | Admitting: Cardiovascular Disease

## 2012-12-30 ENCOUNTER — Telehealth: Payer: Self-pay | Admitting: Cardiovascular Disease

## 2012-12-30 DIAGNOSIS — E7800 Pure hypercholesterolemia, unspecified: Secondary | ICD-10-CM

## 2012-12-30 NOTE — Telephone Encounter (Signed)
New Prob    Pt has some questions regarding some medication refills and if it is necessary for him to come in to see Dr. Excell Seltzer.

## 2012-12-31 MED ORDER — SIMVASTATIN 20 MG PO TABS: 20.0000 mg | ORAL_TABLET | Freq: Every day | ORAL | Status: DC

## 2012-12-31 NOTE — Telephone Encounter (Signed)
Rx for Simvastatin sent to the pharmacy.  Pt scheduled to see Dr Excell Seltzer 04/02/13.

## 2013-01-01 DIAGNOSIS — L570 Actinic keratosis: Secondary | ICD-10-CM | POA: Diagnosis not present

## 2013-01-05 ENCOUNTER — Ambulatory Visit: Payer: Medicare Other | Admitting: Family Medicine

## 2013-01-05 ENCOUNTER — Ambulatory Visit (INDEPENDENT_AMBULATORY_CARE_PROVIDER_SITE_OTHER): Payer: Medicare Other

## 2013-01-05 VITALS — BP 122/76 | HR 63 | Temp 98.3°F | Resp 17 | Ht 66.0 in | Wt 180.0 lb

## 2013-01-05 DIAGNOSIS — M25521 Pain in right elbow: Secondary | ICD-10-CM

## 2013-01-05 DIAGNOSIS — M79609 Pain in unspecified limb: Secondary | ICD-10-CM | POA: Diagnosis not present

## 2013-01-05 DIAGNOSIS — M25529 Pain in unspecified elbow: Secondary | ICD-10-CM

## 2013-01-05 DIAGNOSIS — M79601 Pain in right arm: Secondary | ICD-10-CM

## 2013-01-05 NOTE — Progress Notes (Signed)
Chief complaint: Right arm pain  History of present illness: Patient is a 72 year old male today who was working at a [employer] site and tripped and intended to catch himself with his right arm. Patient heard a very loud audible snap and did have pain in the knee and humerus and elbow immediately. Patient did have some swelling. Patient states that his shoulder felt fine but states that if he did any supination or pronation he had severe pain. Patient has not taken any medication, denies any numbness denies any weakness in the hand. Patient is there had an injury like this prior to. Patient is on simvastatin and has had a recent diagnosis of thrombocytopenia.  Past Medical History  Diagnosis Date  . Coronary atherosclerosis of native coronary artery   . Hyperlipidemia     with low HDL  . Ankylosing spondylitis   . Hypertension   . Allergic rhinitis   . Cough   . Abnormal chest x-ray   . Colonic polyp   . Hemorrhoids   . Rotator cuff tear   . Ganglion cyst of wrist     left  . Other disorder of muscle, ligament, and fascia   . Anxiety   . History of malaria   . Arthritis    Past Surgical History  Procedure Laterality Date  . Rotator cuff repair      bilat by Dr. Madelon Lips  . Hemorrhoid surgery  10/10    in [place]   History  Substance Use Topics  . Smoking status: Never Smoker   . Smokeless tobacco: Never Used  . Alcohol Use: No     Comment: occ 1 beer, wine occ   Family History  Problem Relation Age of Onset  . Cancer Mother     melanoma  . Emphysema Mother   . Rheum arthritis Mother   . Diabetes Mother    Physical exam Blood pressure 122/76, pulse 63, temperature 98.3 F (36.8 C), temperature source Oral, resp. rate 17, height 5\' 6"  (1.676 m), weight 180 lb (81.647 kg), SpO2 96.00%. General: No apparent distress alert and oriented x3 mood and affect normal Respiratory: Patient's speak in full sentences and does not appear short of breath Skin: Warm dry intact with no  signs of infection or rash Neuro: Cranial nerves II through XII are intact, neurovascularly intact in all extremities with 2+ DTRs and 2+ pulses. Right arm exam: On inspection patient does have a deformity of his right bicep. Patient does have good range of motion of his elbow in flexion but does have some pain in extension. Patient does have severe pain with pronation and supination. He is neurovascularly intact distally. Patient has full range of motion of his shoulder. Patient has a positive test with minimal fibers of the distal bicep tendon still attached it feels like. Patient is having some mild swelling over the proximal medial epicondyle. Patient does have a scar on the anterior shoulder from any open rotator cuff repair that is healed.  X-rays were ordered, reviewed and interpreted by me today. Patient did have x-rays of the right elbow as well as the right wrist. Patient does have avulsion fracture off the bicep/radial tuberosity and soft tissue swelling.

## 2013-01-05 NOTE — Patient Instructions (Addendum)
I think you have a distal bicep tendon rupture at this time.  I am sending you to see Dr Ave Filter at [facility] orthopaedics for evaluation and possible surgery. You can call them [phone] Your xray did not show any fracture.  Wear the sling and take ibuprofen if you need it for pain. Ice 20 minutes could be helpful as well.

## 2013-01-06 ENCOUNTER — Telehealth: Payer: Self-pay | Admitting: Cardiovascular Disease

## 2013-01-06 NOTE — Telephone Encounter (Signed)
I spoke with the pt and he fell yesterday and tore a tendon in his bicep.  The pt did under go x-rays yesterday and is scheduled to see his Orthopaedic surgeon, Dr Ave Filter, on Friday for evaluation.  I made the pt aware that at this time he does not need to change any medications.  If the pt needs to under go surgery then a request for surgical clearance will be sent to our office for review.   Pt agreed with plan.

## 2013-01-06 NOTE — Telephone Encounter (Signed)
New Problem   Pt has been advised that he needs an operation for a tendon rupture. He wants to know if he needs to change his medications.

## 2013-01-08 DIAGNOSIS — M79609 Pain in unspecified limb: Secondary | ICD-10-CM | POA: Diagnosis not present

## 2013-01-11 ENCOUNTER — Telehealth: Payer: Self-pay | Admitting: Cardiovascular Disease

## 2013-01-11 NOTE — Telephone Encounter (Signed)
New Prob     Has some questions regarding MRI coming up. Please call.

## 2013-01-11 NOTE — Telephone Encounter (Signed)
I spoke with the pt and he needs his information about the stent that was placed in 2008 in preparation for MRI. The pt's PCI report is not in Epic and I will have to request his paper chart.  I have sent the pt a message in My Chart.

## 2013-01-12 ENCOUNTER — Encounter: Payer: Self-pay | Admitting: Cardiology

## 2013-01-12 NOTE — Telephone Encounter (Signed)
3.0 x 20mm Liberte stent placed to LAD (left anterior descending artery) on 08/19/2006.  This information was sent to the pt through My Chart.

## 2013-01-18 DIAGNOSIS — M19029 Primary osteoarthritis, unspecified elbow: Secondary | ICD-10-CM | POA: Diagnosis not present

## 2013-01-22 DIAGNOSIS — M6688 Spontaneous rupture of other tendons, other: Secondary | ICD-10-CM | POA: Diagnosis not present

## 2013-01-31 ENCOUNTER — Other Ambulatory Visit: Payer: Self-pay | Admitting: Cardiology

## 2013-02-01 DIAGNOSIS — D649 Anemia, unspecified: Secondary | ICD-10-CM | POA: Diagnosis not present

## 2013-02-02 DIAGNOSIS — S53499A Other sprain of unspecified elbow, initial encounter: Secondary | ICD-10-CM | POA: Diagnosis not present

## 2013-02-02 DIAGNOSIS — X58XXXA Exposure to other specified factors, initial encounter: Secondary | ICD-10-CM | POA: Diagnosis not present

## 2013-02-02 DIAGNOSIS — M66329 Spontaneous rupture of flexor tendons, unspecified upper arm: Secondary | ICD-10-CM | POA: Diagnosis not present

## 2013-02-02 DIAGNOSIS — Y929 Unspecified place or not applicable: Secondary | ICD-10-CM | POA: Diagnosis not present

## 2013-02-02 DIAGNOSIS — Y999 Unspecified external cause status: Secondary | ICD-10-CM | POA: Diagnosis not present

## 2013-02-02 DIAGNOSIS — Y939 Activity, unspecified: Secondary | ICD-10-CM | POA: Diagnosis not present

## 2013-02-10 DIAGNOSIS — M66329 Spontaneous rupture of flexor tendons, unspecified upper arm: Secondary | ICD-10-CM | POA: Diagnosis not present

## 2013-03-05 DIAGNOSIS — M66329 Spontaneous rupture of flexor tendons, unspecified upper arm: Secondary | ICD-10-CM | POA: Diagnosis not present

## 2013-03-05 DIAGNOSIS — M6688 Spontaneous rupture of other tendons, other: Secondary | ICD-10-CM | POA: Diagnosis not present

## 2013-03-11 DIAGNOSIS — M66329 Spontaneous rupture of flexor tendons, unspecified upper arm: Secondary | ICD-10-CM | POA: Diagnosis not present

## 2013-03-11 DIAGNOSIS — M6688 Spontaneous rupture of other tendons, other: Secondary | ICD-10-CM | POA: Diagnosis not present

## 2013-03-18 DIAGNOSIS — M66329 Spontaneous rupture of flexor tendons, unspecified upper arm: Secondary | ICD-10-CM | POA: Diagnosis not present

## 2013-03-18 DIAGNOSIS — M6688 Spontaneous rupture of other tendons, other: Secondary | ICD-10-CM | POA: Diagnosis not present

## 2013-03-25 DIAGNOSIS — M6688 Spontaneous rupture of other tendons, other: Secondary | ICD-10-CM | POA: Diagnosis not present

## 2013-03-25 DIAGNOSIS — M66329 Spontaneous rupture of flexor tendons, unspecified upper arm: Secondary | ICD-10-CM | POA: Diagnosis not present

## 2013-04-02 ENCOUNTER — Ambulatory Visit: Payer: Medicare Other | Admitting: Cardiovascular Disease

## 2013-04-04 ENCOUNTER — Other Ambulatory Visit: Payer: Self-pay | Admitting: Cardiovascular Disease

## 2013-04-07 DIAGNOSIS — M6688 Spontaneous rupture of other tendons, other: Secondary | ICD-10-CM | POA: Diagnosis not present

## 2013-04-07 DIAGNOSIS — M66329 Spontaneous rupture of flexor tendons, unspecified upper arm: Secondary | ICD-10-CM | POA: Diagnosis not present

## 2013-04-16 DIAGNOSIS — M66329 Spontaneous rupture of flexor tendons, unspecified upper arm: Secondary | ICD-10-CM | POA: Diagnosis not present

## 2013-04-16 DIAGNOSIS — M6688 Spontaneous rupture of other tendons, other: Secondary | ICD-10-CM | POA: Diagnosis not present

## 2013-04-23 DIAGNOSIS — M66329 Spontaneous rupture of flexor tendons, unspecified upper arm: Secondary | ICD-10-CM | POA: Diagnosis not present

## 2013-04-26 ENCOUNTER — Ambulatory Visit (HOSPITAL_BASED_OUTPATIENT_CLINIC_OR_DEPARTMENT_OTHER): Payer: Medicare Other | Admitting: Physician Assistant

## 2013-04-26 ENCOUNTER — Other Ambulatory Visit (HOSPITAL_BASED_OUTPATIENT_CLINIC_OR_DEPARTMENT_OTHER): Payer: Medicare Other | Admitting: Lab

## 2013-04-26 ENCOUNTER — Encounter: Payer: Self-pay | Admitting: Physician Assistant

## 2013-04-26 VITALS — BP 118/74 | HR 56 | Temp 99.5°F | Resp 19 | Ht 66.0 in | Wt 189.1 lb

## 2013-04-26 DIAGNOSIS — D72819 Decreased white blood cell count, unspecified: Secondary | ICD-10-CM | POA: Diagnosis not present

## 2013-04-26 DIAGNOSIS — D696 Thrombocytopenia, unspecified: Secondary | ICD-10-CM

## 2013-04-26 LAB — CBC WITH DIFFERENTIAL/PLATELET
Eosinophils Absolute: 0 10*3/uL (ref 0.0–0.5)
MONO#: 0.4 10*3/uL (ref 0.1–0.9)
MONO%: 11.2 % (ref 0.0–14.0)
NEUT#: 2.3 10*3/uL (ref 1.5–6.5)
RBC: 4.74 10*6/uL (ref 4.20–5.82)
RDW: 14.8 % — ABNORMAL HIGH (ref 11.0–14.6)
WBC: 3.9 10*3/uL — ABNORMAL LOW (ref 4.0–10.3)
lymph#: 1.2 10*3/uL (ref 0.9–3.3)
nRBC: 0 % (ref 0–0)

## 2013-04-26 LAB — LACTATE DEHYDROGENASE (CC13): LDH: 105 U/L — ABNORMAL LOW (ref 125–245)

## 2013-04-26 NOTE — Progress Notes (Addendum)
[facility] [phone]   [phone]  SHARED VISIT PROGRESS NOTE  Michele Mcalpine, MD [address] [place] [address]  DIAGNOSIS: Thrombocytopenia, most likely idiopathic thrombocytopenic purpura.  PRIOR THERAPY:None.  CURRENT THERAPY: observation.  INTERVAL HISTORY: [name] 72 y.o. male returns to the clinic today for followup visit. Since last being seen in our office the patient states that he suffered a right distal tendon tear. This has been repaired and he continues with physical therapy. Patient states that he slipped while getting off a piece of machinery and that's how he tore his biceps tendon. He still has some residual bruising in this area. He denied any other issues with bruising and has had no bleeding. He does report itchy fine rash at both of his elbows. He states he sometimes small pimple that he will scratch believes. He states that it is very itchy. This is been going on for the past 2 weeks. He is an appointment with his primary care physician Dr. Alroy Dust on 04/27/2013. He voiced no other specific complaints.   The patient has no chest pain, shortness breath, cough or hemoptysis. He denied having any significant weight loss or night sweats.   MEDICAL HISTORY: Past Medical History  Diagnosis Date  . Coronary atherosclerosis of native coronary artery   . Hyperlipidemia     with low HDL  . Ankylosing spondylitis   . Hypertension   . Allergic rhinitis   . Cough   . Abnormal chest x-ray   . Colonic polyp   . Hemorrhoids   . Rotator cuff tear   . Ganglion cyst of wrist     left  . Other disorder of muscle, ligament, and fascia   . Anxiety   . History of malaria   . Arthritis     ALLERGIES:  has No Known Allergies.  MEDICATIONS:  Current Outpatient Prescriptions  Medication Sig Dispense Refill  . aspirin 81 MG EC tablet Take 81 mg by mouth at bedtime.       . carvedilol (COREG) 3.125 MG tablet TAKE ONE  TABLET BY MOUTH TWICE DAILY WITH MEALS  60 tablet  0  . simvastatin (ZOCOR) 20 MG tablet Take 1 tablet (20 mg total) by mouth at bedtime.  30 tablet  6   No current facility-administered medications for this visit.    SURGICAL HISTORY:  Past Surgical History  Procedure Laterality Date  . Rotator cuff repair      bilat by Dr. Madelon Lips  . Hemorrhoid surgery  10/10    in [place]    REVIEW OF SYSTEMS:  A comprehensive review of systems was negative.   PHYSICAL EXAMINATION: General appearance: alert, cooperative and no distress Head: Normocephalic, without obvious abnormality, atraumatic Neck: no adenopathy Lymph nodes: Cervical, supraclavicular, and axillary nodes normal. Resp: clear to auscultation bilaterally Cardio: regular rate and rhythm, S1, S2 normal, no murmur, click, rub or gallop GI: soft, non-tender; bowel sounds normal; no masses,  no organomegaly Extremities: extremities normal, atraumatic, no cyanosis or edema Skin: Right upper extremity in the antecubital fossa there is a well-healed surgical incision with mild medial fading ecchymosis  ECOG PERFORMANCE STATUS: 0 - Asymptomatic  Blood pressure 118/74, pulse 56, temperature 99.5 F (37.5 C), temperature source Oral, resp. rate 19, height 5\' 6"  (1.676 m), weight 189 lb 1.6 oz (85.775 kg).  LABORATORY DATA: Lab Results  Component Value Date   WBC 3.9* 04/26/2013   HGB 14.6 04/26/2013  HCT 43.2 04/26/2013   MCV 91.1 04/26/2013   PLT 79* 04/26/2013      Chemistry      Component Value Date/Time   NA 142 09/30/2012 0949   NA 141 09/02/2012 1313   K 4.0 09/30/2012 0949   K 4.2 09/02/2012 1313   CL 106 09/30/2012 0949   CL 106 09/02/2012 1313   CO2 27 09/30/2012 0949   CO2 27 09/02/2012 1313   BUN 19.5 09/30/2012 0949   BUN 17 09/02/2012 1313   CREATININE 1.0 09/30/2012 0949   CREATININE 1.0 09/02/2012 1313      Component Value Date/Time   CALCIUM 9.4 09/30/2012 0949   CALCIUM 9.4 09/02/2012 1313   ALKPHOS 60 09/30/2012 0949    ALKPHOS 53 08/03/2012 1019   AST 26 09/30/2012 0949   AST 42* 08/03/2012 1019   ALT 40 09/30/2012 0949   ALT 62* 08/03/2012 1019   BILITOT 0.59 09/30/2012 0949   BILITOT 1.1 08/03/2012 1019     BONE MARROW REPORT FINAL DIAGNOSIS Diagnosis Bone Marrow, Aspirate and Biopsy, left iliac - HYPERCELLULAR BONE MARROW FOR AGE WITH TRILINEAGE HEMATOPOIESIS INCLUDING ABUNDANT MEGAKARYOCYTES. - SEE COMMENT. PERIPHERAL BLOOD: - MONOCYTOSIS. - THROMBOCYTOPENIA. Diagnosis Note The bone marrow is hypercellular with trilineage hematopoiesis including abundant morphologically normal megakaryocytes. Significant dyspoietic changes are not present. Given the peripheral blood findings, the overall features are considered non specific but a secondary process is favored. Correlation with cytogenetic studies is recommended. (BNS:gt, 11-18-12) [name] MD Pathologist, Electronic Signature (Case signed November 18, 2012)  RADIOGRAPHIC STUDIES: No results found.  ASSESSMENT: this is a very pleasant 72 years old white male with thrombocytopenia most likely idiopathic thrombocytopenic purpura. The patient is currently asymptomatic. His bone marrow biopsy and aspirate showed no significant abnormality.   PLAN: The patient was discussed with and also seen by Dr. Arbutus Ped. His platelet count has improved from 61,000 when he was seen 6 months ago to 79,000 today. He will continue continue on observation and followup with Dr. Arbutus Ped in 6 months with repeat CBC differential, C. met and LDH. Patient is again cautioned to notify us in the interim should he develop any significant bruising or any bleeding. Patient voiced understanding. Followup with your primary care physician regarding your complaints of rash on your elbows.  [name], Blake Vetrano E, PA-C   All questions were answered. The patient knows to call the clinic with any problems, questions or concerns. We can certainly see the patient much sooner if  necessary.   ADDENDUM: Hematology/Oncology Attending: I have the face to face encounter with the patient today. I recommended his care planned. The patient is feeling fine today with no specific complaints. He has a right distal biceps tendon tear with small bruises in this area. He is otherwise feeling fine with no significant complaints. He denied having any bleeding, bruises or ecchymosis. The patient had repeat CBC performed earlier today that showed improvement in his platelet count up to 79,000. I recommended for the patient to continue on observation with repeat CBC in 6 months. He was advised to call immediately she has any concerning symptoms in the interval especially any significant bleeding, bruises or ecchymosis. Lajuana Matte., MD 04/26/2013

## 2013-04-26 NOTE — Patient Instructions (Signed)
Notify Dr. Arbutus Ped if you develop significant bruising or bleeding Follow up in 6 months

## 2013-04-27 ENCOUNTER — Encounter: Payer: Self-pay | Admitting: Pulmonary Disease

## 2013-04-27 ENCOUNTER — Ambulatory Visit (INDEPENDENT_AMBULATORY_CARE_PROVIDER_SITE_OTHER): Payer: Medicare Other | Admitting: Pulmonary Disease

## 2013-04-27 VITALS — BP 114/70 | HR 56 | Temp 98.5°F | Ht 68.0 in | Wt 190.0 lb

## 2013-04-27 DIAGNOSIS — F411 Generalized anxiety disorder: Secondary | ICD-10-CM

## 2013-04-27 DIAGNOSIS — K7689 Other specified diseases of liver: Secondary | ICD-10-CM

## 2013-04-27 DIAGNOSIS — I2510 Atherosclerotic heart disease of native coronary artery without angina pectoris: Secondary | ICD-10-CM

## 2013-04-27 DIAGNOSIS — D696 Thrombocytopenia, unspecified: Secondary | ICD-10-CM

## 2013-04-27 DIAGNOSIS — R7309 Other abnormal glucose: Secondary | ICD-10-CM

## 2013-04-27 DIAGNOSIS — K760 Fatty (change of) liver, not elsewhere classified: Secondary | ICD-10-CM

## 2013-04-27 DIAGNOSIS — D126 Benign neoplasm of colon, unspecified: Secondary | ICD-10-CM

## 2013-04-27 DIAGNOSIS — E7800 Pure hypercholesterolemia, unspecified: Secondary | ICD-10-CM | POA: Diagnosis not present

## 2013-04-27 DIAGNOSIS — M459 Ankylosing spondylitis of unspecified sites in spine: Secondary | ICD-10-CM

## 2013-04-27 MED ORDER — HYDROXYZINE HCL 25 MG PO TABS: 25.0000 mg | ORAL_TABLET | Freq: Four times a day (QID) | ORAL | Status: DC | PRN

## 2013-04-27 NOTE — Progress Notes (Signed)
Subjective:    Patient ID: [name], male    DOB: 11-01-1940, 72 y.o.   MRN: [id number]  HPI 72 y/o WM here for an ROV... he has mult medical problems & is followed regularly by DrBBrodie for Cards w/ hx of CAD, s/p AWMI w/ VFib arrest, subseq PTCA/ stent 1/08... he also has Hypercholesterolemia & followed [facility] on Simvastatin & Niaspan...  ~  June 01, 2012:  Yearly ROV & review> [name] tends to use the physicians at the [facility] for his primary care needs & avail records indicate visits for tinea pedis, infected insect bite, cerumen impaction, etc...  We reviewed the following medical problems during today's yearly office visit>>     AR/ Cough/ AbnCXR> he has mild basilar fibrosis, nonprogressive, & sl elev right hemidiaph; stable- no change...    CAD> on ASA81, Coreg3.125Bid; s/p AWMI 2008 w/ PCI to LAD; followed by DrCooper, Cards- last seen 11/12, asymptomatic, stable, no changes made; he denies CP, palpit, dizzy, SOB, edema; he's been too sedentary and about to restart an exercise program...    Chol> on Simva40-1/2 daily; FLP shows TChol 124, TG 104, HDL 38, LDL 66    Colon polyps, Hems> doing satis- denies abd pain, n/v, c/d, blood seen; last colon was 2007 by DrPeters in [place] & f/u planned 19yrs.    Elev LFTs> routine labs showed sl incr SGOT/ SGPT and Ultrasound showed prob fatty infiltration; rec weight reduction, diet Rx...    Ortho> Ankylosing Spondy> he is HLA B27 pos, hx rotator cuff tears w/ bilat surg, ganglion cyst surg; using OTC meds as needed...     Anxiety> under stress, wife has had 3 operations- right knee, hammer toes, etc... We reviewed prob list, meds, xrays and labs> see below for updates >>  CXR 10/13 showed normal heart size, clear lungs, chr elev right hemidiaph, DJD in Tspine... Abd Sonar 10/13 showed normal GB, heterogeneous liver w/ incr echotexture c/w fatty infiltration, sm right renal cyst... LABS 10/13:  FLP- at goals on Simva20;  Chems- wnl x sl  incr LFTs;  CBC- wnl x low plat at 76K;  TSH=2.62;  PSA=1.43   ~  October 23, 2012:  71mo ROV & Ron has noted some elev BSs- home checks betw 100-140, and lab today shows A1c=5.4; we discussed "Impaired fasting glucose" and treatment w/ low carb diet & wt reduction... We reviewed the following medical problems during today's office visit >>     AR/ Cough/ AbnCXR> he has mild basilar fibrosis, nonprogressive, & sl elev right hemidiaph; stable- no change...    CAD> on ASA81, Coreg3.125Bid; s/p AWMI 2008 w/ PCI to LAD; followed by DrCooper- he had Treadmill 1/14 "I failed it" w/ hypertensive BP response; then Cath w/ single vessel dis in LAD & branch, & lower level blockages in RCA/ Circ/ & normal LVF...    Chol> on Simva40-1/2 daily- recently decr further to 10mg /d by DrMohammed to see if his platelets improve; FLP 10/13 shows TChol 124, TG 104, HDL 38, LDL 66    Colon polyps, Hems> doing satis- denies abd pain, n/v, c/d, blood seen; last colon was 2007 by DrPeters in [place] & f/u planned 41yrs.    Elev LFTs> routine labs showed sl incr SGOT/ SGPT and Ultrasound showed prob fatty infiltration; rec weight reduction, diet Rx...    Ortho> Ankylosing Spondy> he is HLA B27 pos, hx rotator cuff tears w/ bilat surg, ganglion cyst surg; notes some LBP w/ walking; using OTC meds  as needed...     Anxiety> under stress, wife has had 3 operations- right knee, hammer toes, etc...    Thrombocytopenia> ?idiopathic, not assoc w/ bleeding diathesis; eval by DrMohammed & on Observation... We reviewed prob list, meds, xrays and labs> see below for updates >> he had the 2013 flu vaccine 10/13;  States he had Shingles vaccine prev ?date? LABS 2-3-4/14: Chems- wnl x BS=198;  CBC- Hg=13.5, WBC=2.9, Plat=66K => repeats 61-73... LABS 3/14:  A1c=5.4.[name].  ~  April 27, 2013:  57mo ROV & Ron has been stable in the interval- only c/o itching & we prescribed Atarax25 as needed... We reviewed the following medical problems during today's  office visit >>     AR/ Cough/ AbnCXR> he has mild basilar fibrosis, nonprogressive, & sl elev right hemidiaph; stable- no change...    CAD> on ASA81, Coreg3.125Bid; s/p AWMI 2008 w/ PCI to LAD; followed by DrCooper- he had Treadmill 1/14 "I failed it" w/ hypertensive BP response; then Cath w/ single vessel dis in LAD & branch, & lower level blockages in RCA/ Circ/ & normal LVF...    Chol> on Simva40-1/2 daily- recently decr further to 10mg /d by DrMohammed to see if his platelets improve; FLP 10/13 shows TChol 124, TG 104, HDL 38, LDL 66    Colon polyps, Hems> doing satis- denies abd pain, n/v, c/d, blood seen; last colon was 2007 by DrPeters in [place] & f/u planned 101yrs.    Elev LFTs> routine labs showed sl incr SGOT/ SGPT and Ultrasound showed prob fatty infiltration; rec weight reduction, diet Rx...    Ortho> Ankylosing Spondy> he is HLA B27 pos, hx rotator cuff tears w/ bilat surg, ganglion cyst surg; notes some LBP w/ walking; injured biceps tendon at work 5/14- DrChandler did surgery, then PT...    Anxiety> under stress, wife has had 3 operations- right knee, hammer toes, etc...    Thrombocytopenia> ?idiopathic (ITP), not assoc w/ bleeding diathesis; eval by DrMohammed 3/14 w/ bone marrow & on Observation... We reviewed prob list, meds, xrays and labs> see below for updates >> he wants to wait 57mo for f/u CXR & fasting labs...          Problem List:    ALLERGIC RHINITIS (ICD-477.9) - hx AR w/ +allergy testing to ragweed & dust in the past.  COUGH (ICD-786.2) - eval 3/11 for dry irritative cough, on Enalapril, & cough resolved off ACE Rx.  Hx of ABNORMAL CHEST XRAY (ICD-793.1) - hx mild basilar pulm fibrosis on old CXR/ CT scan in 2002... there is also mild nonprogressive elevation of the right hemidiaphragm on old films... ~  CXR 10/13 showed normal heart size, clear lungs, chr elev right hemidiaph, DJD in Tspine...  CAD, NATIVE VESSEL (ICD-414.01) - he had an AWMI 1/08 w/ VFib arrest,  successful resus, urgent cath w/ PTCA/ stent to LAD by DrBBrodie... on ASA 81mg /d, & CARVEDILOL 3.125mg Bid... prev Enalapril stopped due to cough & switched to Benicar> pt asked to stop Benicar due to BP= 100/54, weak/ no energy etc...  denies CP, palpit, syncope, dyspnea, swelling, etc... ~  2DEcho 3/08 showed mild septal HK, w/ LVF at lower limit of norm. ~  Myoview 8/09 showed ? sm inferior MI vs diaph attenuation, no ischemia, EF= 56%. ~  EKG 12/12 showed SBrady, rate59, poor R progression V1-2... ~  Cardiac Cath 1/14 showed single vessel CAD> patent LAD stent & 20-30% in mid vessel & diagonal branch had 75% stenosis in mid-vessel; RCA had 40-50% midRCA stenosis;  Circ w/ 20-30% irregularities; normal LVF; DrCooper Rec medical management...  HYPERCHOLESTEROLEMIA (ICD-272.0) - followed in the [facility] on SIMVASTATIN 40mg - 1/2 tab daily & NIASPAN 500mg - 2/d... ~  FLP 10/10 on Simva40+Niasp1000 showed TChol 94, TG 44, HDL 36, LDL 49... keep same Rx. ~  FLP 4/11 on Simva40+Niasp1000 showed TChol 61, TG 44, LDL 28, LDL 25... pt rec to decr to Simva20. ~  FLP 9/11 on Simva20+Niasp1000 showed TChol 103, TG 60, HDL 35, LDL 56... [facility] decr the Niaspan to 1/d. ~  4/12:  [facility] discontinued his Niacin Rx... ~  FLP 2/13 on simva20 showed TChol 114, TG 63, HDL 42, LDL 60 ~  FLP 10/13 on Simva20 showed TChol 124, TG 104, HDL 38, LDL 66  IMPAIRED FASTING GLUCOSE >> on low carb diet & needs to lose weight... ~  2014: BS as high as 200 but home BS checks ave 100-140; A1c done 3/14 = 5.4 & he is reassured, diet rx alone...  COLONIC POLYPS (ICD-211.3) ~  colonoscopy 11/02 by Dorris Singh showed divertics, hems, otherw neg. ~  colonoscopy in [place] DrPeters 10/07 w/ 1 hyperplastic polyp removed> f/u planned 2yrs.  HEMORRHOIDS (ICD-455.6) - he reports hemorrhoid surg 10/10 in [place]...  Prob mild FATTY LIVER DISEASE >> rec to get on diet, get wt down...  ~  Abd Sonar 10/13 showed normal gallbladder, normal  ducts, sl incr liver echotexture may reflect fatty infiltration; 12mm lower pole right renal cyst...   ANKYLOSING SPONDYLITIS (ICD-720.0) - eval for Ortho DrMurphy, & Rheum DrDeveshwar w/ Dx of HLA B-27 pos Ankylosing Spondylitis> w/ mild morning stiffness & back pain prev treated w/ Celebrex=> pt stopped it because "it causes heart attacks"... Hx of ROTATOR CUFF TEAR (ICD-727.61) - hx bilat rotator cuff repairs by DrCaffrey GANGLION CYST, WRIST, LEFT (ICD-727.41) - new prob 4/11 & he will check w/ DrMurphy...  Hx of OTHER DISORDER OF MUSCLE LIGAMENT AND FASCIA (ICD-728.89) - he had a spont hemorrhage into his right thigh 1996  ANXIETY (ICD-300.00) - his son was killed in 1998  Hx of MALARIA (ICD-084.6) - he has malaria while in the service  LOW PLATELET COUNT >> ?etiology- Plats=76K & range 74-118, no bleeding diathesis, bruising, etc... ~  Abd Sonar 10/13 showed prob mild fatty liver & normal appearing spleen... ~  2/14: consult by DrMohammed for Heme/Onc => he did Bone Marrow Bx- hypercellular marrow w/ abundant megakaryocytes; Heme felt he prob has idiopathic thrombocytopathic purpura & Rec observation status... ~  Followed regularly by DrMohammed & plat counts in 2014 = 61-79K...  HEALTH MAINTENANCE: ~  GI:  followed by DrPeters in [place] & up to date on colonoscopies... ~  GU:  exam is neg and PSA= 2.64 ~  Immuniz:  he gets the Flu vaccine each Fall... had PNEUMOVAX in 2010 at age 58.   Past Surgical History  Procedure Laterality Date  . Rotator cuff repair      bilat by Dr. Madelon Lips  . Hemorrhoid surgery  10/10    in [place]    Outpatient Encounter Prescriptions as of 04/27/2013  Medication Sig Dispense Refill  . aspirin 81 MG EC tablet Take 81 mg by mouth at bedtime.       . carvedilol (COREG) 3.125 MG tablet TAKE ONE TABLET BY MOUTH TWICE DAILY WITH MEALS  60 tablet  0  . simvastatin (ZOCOR) 20 MG tablet Take 1 tablet (20 mg total) by mouth at bedtime.  30 tablet  6   No  facility-administered encounter  medications on file as of 04/27/2013.    No Known Allergies   Current Medications, Allergies, Past Medical History, Past Surgical History, Family History, and Social History were reviewed in [employer] record.     Review of Systems    The patient denies fever, chills, sweats, anorexia, fatigue, weakness, malaise, weight loss, sleep disorder, blurring, diplopia, eye irritation, eye discharge, vision loss, eye pain, photophobia, earache, ear discharge, tinnitus, decreased hearing, nasal congestion, nosebleeds, sore throat, hoarseness, chest pain, palpitations, syncope, dyspnea on exertion, orthopnea, PND, peripheral edema, cough, dyspnea at rest, excessive sputum, hemoptysis, wheezing, pleurisy, nausea, vomiting, diarrhea, constipation, change in bowel habits, abdominal pain, melena, hematochezia, jaundice, gas/bloating, indigestion/heartburn, dysphagia, odynophagia, dysuria, hematuria, urinary frequency, urinary hesitancy, nocturia, incontinence, back pain, joint pain, joint swelling, muscle cramps, muscle weakness, stiffness, arthritis, sciatica, restless legs, leg pain at night, leg pain with exertion, rash, itching, dryness, suspicious lesions, paralysis, paresthesias, seizures, tremors, vertigo, transient blindness, frequent falls, frequent headaches, difficulty walking, depression, anxiety, memory loss, confusion, cold intolerance, heat intolerance, polydipsia, polyphagia, polyuria, unusual weight change, abnormal bruising, bleeding, enlarged lymph nodes, urticaria, allergic rash, hay fever, and recurrent infections.     Objective:   Physical Exam      WD, WN, 72 y/o WM in NAD... GENERAL:  Alert & oriented; pleasant & cooperative... HEENT:  [place]/AT, EOM-wnl, PERRLA, EACs-clear, TMs-wnl, NOSE-clear, THROAT-clear & wnl. NECK:  Supple w/ fairROM; no JVD; normal carotid impulses w/o bruits; no thyromegaly or nodules palpated; no  lymphadenopathy. CHEST:  Clear to P & A; without wheezes/ rales/ or rhonchi. HEART:  Regular Rhythm; without murmurs/ rubs/ or gallops. ABDOMEN:  Soft & nontender; normal bowel sounds; no organomegaly or masses detected. RECTAL:  prostate 3+ smooth w/o nodules, rectal neg- stool heme neg. EXT: without deformities, mild arthritic changes; no varicose veins/ venous insuffic/ or edema. NEURO:  CN's intact; motor testing normal; sensory testing normal; gait normal & balance OK. DERM:  No lesions noted; no rash etc...  RADIOLOGY DATA:  Reviewed in the EPIC EMR & discussed w/ the patient...  LABORATORY DATA:  Reviewed in the EPIC EMR & discussed w/ the patient...   Assessment & Plan:    CAD>  followed by DrCooper & stable post cath 1/14 on ASA, BBlocker, Statin;  Continue Rx + diet & exercise...  CHOL>  Prev followed in the [facility] & they stopped his Niacin, on Sima20 w/ good FLP; DrMohammed decr the dose to 10mg  to check effect on plat ct.  GI> stable & up to date...  ORTHO>  Hx AS, etc> he uses Tylenol for pain & avoids NSAIDs as he thinks the Celebrex may have caused his MI...  Thrombocytopenia> low platelet counts w/o bleeding diathesis; Neg bone marrow, on observation....  Other medical issues as noted...   Patient's Medications  New Prescriptions   TADALAFIL (CIALIS) 10 MG TABLET    Take 1 tablet (10 mg total) by mouth as needed for erectile dysfunction.  Previous Medications   ASPIRIN 81 MG EC TABLET    Take 81 mg by mouth at bedtime.    FLUOROURACIL (EFUDEX) 5 % CREAM    Apply topically daily.    SIMVASTATIN (ZOCOR) 20 MG TABLET    Take 1 tablet (20 mg total) by mouth at bedtime.  Modified Medications   Modified Medication Previous Medication   CARVEDILOL (COREG) 3.125 MG TABLET carvedilol (COREG) 3.125 MG tablet      TAKE ONE TABLET BY MOUTH TWICE DAILY WITH MEALS  TAKE ONE TABLET BY MOUTH TWICE DAILY WITH MEALS  Discontinued Medications   CARVEDILOL (COREG) 3.125  MG TABLET    TAKE ONE TABLET BY MOUTH TWICE DAILY WITH MEALS   HYDROXYZINE (ATARAX/VISTARIL) 25 MG TABLET    Take 1 tablet (25 mg total) by mouth every 6 (six) hours as needed for itching.

## 2013-04-27 NOTE — Patient Instructions (Addendum)
Today we updated your med list in our EPIC system...    Continue your current medications the same...  We wrote a new prescription for Atarax (Hydroxyzine) 25mg - take 1 tab every 4h as needed for itching...  Let's get on track w/ our diet & exercise program...     The goal is to lose 15-20 lbs...  Call for any questions...  Let's plan a follow up visit in 7mo w/ CXR & FASTING blood work at that time.Marland KitchenMarland Kitchen

## 2013-04-28 ENCOUNTER — Telehealth: Payer: Self-pay | Admitting: Internal Medicine

## 2013-04-28 DIAGNOSIS — M6688 Spontaneous rupture of other tendons, other: Secondary | ICD-10-CM | POA: Diagnosis not present

## 2013-04-28 DIAGNOSIS — M66329 Spontaneous rupture of flexor tendons, unspecified upper arm: Secondary | ICD-10-CM | POA: Diagnosis not present

## 2013-05-05 DIAGNOSIS — M25539 Pain in unspecified wrist: Secondary | ICD-10-CM | POA: Diagnosis not present

## 2013-05-05 DIAGNOSIS — M66329 Spontaneous rupture of flexor tendons, unspecified upper arm: Secondary | ICD-10-CM | POA: Diagnosis not present

## 2013-05-05 DIAGNOSIS — M6688 Spontaneous rupture of other tendons, other: Secondary | ICD-10-CM | POA: Diagnosis not present

## 2013-05-08 ENCOUNTER — Other Ambulatory Visit: Payer: Self-pay | Admitting: Cardiovascular Disease

## 2013-05-25 ENCOUNTER — Ambulatory Visit (INDEPENDENT_AMBULATORY_CARE_PROVIDER_SITE_OTHER): Payer: Medicare Other | Admitting: Family Medicine

## 2013-05-25 ENCOUNTER — Ambulatory Visit: Payer: Medicare Other

## 2013-05-25 VITALS — BP 115/60 | HR 57 | Temp 99.0°F | Resp 16 | Ht 67.0 in | Wt 191.8 lb

## 2013-05-25 DIAGNOSIS — J189 Pneumonia, unspecified organism: Secondary | ICD-10-CM | POA: Diagnosis not present

## 2013-05-25 DIAGNOSIS — R0609 Other forms of dyspnea: Secondary | ICD-10-CM | POA: Diagnosis not present

## 2013-05-25 DIAGNOSIS — R059 Cough, unspecified: Secondary | ICD-10-CM

## 2013-05-25 DIAGNOSIS — R509 Fever, unspecified: Secondary | ICD-10-CM

## 2013-05-25 DIAGNOSIS — R0600 Dyspnea, unspecified: Secondary | ICD-10-CM

## 2013-05-25 DIAGNOSIS — R05 Cough: Secondary | ICD-10-CM

## 2013-05-25 DIAGNOSIS — R0989 Other specified symptoms and signs involving the circulatory and respiratory systems: Secondary | ICD-10-CM

## 2013-05-25 LAB — POCT CBC
MCHC: 32.8 g/dL (ref 31.8–35.4)
MPV: 12.2 fL (ref 0–99.8)
RDW, POC: 15.6 %
WBC: 11.3 10*3/uL — AB (ref 4.6–10.2)

## 2013-05-25 MED ORDER — HYDROCODONE-HOMATROPINE 5-1.5 MG/5ML PO SYRP: ORAL_SOLUTION | ORAL | Status: DC

## 2013-05-25 MED ORDER — AZITHROMYCIN 250 MG PO TABS: ORAL_TABLET | ORAL | Status: DC

## 2013-05-25 NOTE — Patient Instructions (Addendum)
Your bloddcount and chest xray suggest a possible pneumonia. Will start antibiotic today, Saline nasal spray atleast 4 times per day if needed for nasal congestion.  Over the counter mucinex or mucinex DM during day for cough. Cough syrup at night if needed for sleep, but if wheezing or short of breath, do not use the cough suppressant.  Return to the clinic or go to the nearest emergency room if any of your symptoms worsen or new symptoms occur, but plan on follow up with Dr. Neva Seat in 2 days as long as you are not worsening.  If you have any chest pain, more shortness of breath, or any worsening - go to an emergency room.

## 2013-05-25 NOTE — Progress Notes (Signed)
Subjective:    Patient ID: [name], male    DOB: [birth year], 72 y.o.   MRN: [id number]  HPI [name] is a 72 y.o. male PCP: NADEL,SCOTT M, MD  Slight congestion, sore throat, initially tingling in chest, chest congestion, and sinus congestion about 4-5 days ago.  Short of breath last night, sweats and chills. No known sick contacts. Subjective f/c this am. Mid center chest congestion - trouble getting a deep breath, short of breath. No chest pain.  Coughing clear phlegm.   No flu vaccine yet this year. Has had pneumovax.    Tx: otc advil, theraflu.   Hx of MI in 2008, s/p PTCA in 2008. No hx of CHF. Cards: Excell Seltzer. Stress test 6 or 8 months ago, had cath - some blockage developing, but no stent needed at that time.    Past Medical History  Diagnosis Date  . Coronary atherosclerosis of native coronary artery   . Hyperlipidemia     with low HDL  . Ankylosing spondylitis   . Hypertension   . Allergic rhinitis   . Cough   . Abnormal chest x-ray   . Colonic polyp   . Hemorrhoids   . Rotator cuff tear   . Ganglion cyst of wrist     left  . Other disorder of muscle, ligament, and fascia   . Anxiety   . History of malaria   . Arthritis    Past Surgical History  Procedure Laterality Date  . Rotator cuff repair      bilat by Dr. Madelon Lips  . Hemorrhoid surgery  10/10    in [place]    No Known Allergies Prior to Admission medications   Medication Sig Start Date End Date Taking? Authorizing Provider  aspirin 81 MG EC tablet Take 81 mg by mouth at bedtime.    Yes Historical Provider, MD  carvedilol (COREG) 3.125 MG tablet TAKE ONE TABLET BY MOUTH TWICE DAILY WITH MEALS 05/08/13  Yes Tonny Bollman, MD  simvastatin (ZOCOR) 20 MG tablet Take 1 tablet (20 mg total) by mouth at bedtime. 12/31/12  Yes Tonny Bollman, MD  hydrOXYzine (ATARAX/VISTARIL) 25 MG tablet Take 1 tablet (25 mg total) by mouth every 6 (six) hours as needed for itching. 04/27/13   Michele Mcalpine, MD    History   Social History  . Marital Status: Married    Spouse Name: Elnita Maxwell    Number of Children: 2  . Years of Education: N/A   Occupational History  . Not on file.   Social History Main Topics  . Smoking status: Never Smoker   . Smokeless tobacco: Never Used  . Alcohol Use: No     Comment: occ 1 beer, wine occ  . Drug Use: No  . Sexual Activity: Yes   Other Topics Concern  . Not on file   Social History Narrative  . No narrative on file   Review of Systems  Constitutional: Positive for fever and chills.  HENT: Positive for congestion, postnasal drip and sinus pressure.   Respiratory: Positive for cough and shortness of breath. Negative for wheezing.   Cardiovascular: Negative for chest pain.  Skin: Negative for rash.       Objective:   Physical Exam  Vitals reviewed. Constitutional: He is oriented to person, place, and time. He appears well-developed and well-nourished.  HENT:  Head: Normocephalic and atraumatic.  Right Ear: Tympanic membrane, external ear and ear canal normal.  Left Ear: Tympanic membrane, external ear  and ear canal normal.  Nose: No rhinorrhea.  Mouth/Throat: Oropharynx is clear and moist and mucous membranes are normal. No oropharyngeal exudate or posterior oropharyngeal erythema.  Cerumen on L canal. No apparent canal erythema/exudate.   Eyes: Conjunctivae are normal. Pupils are equal, round, and reactive to light.  Neck: Neck supple.  Cardiovascular: Normal rate, regular rhythm, normal heart sounds and intact distal pulses.   No murmur heard. No pedal edema.   Pulmonary/Chest: Effort normal. No accessory muscle usage. Not tachypneic. No respiratory distress. He has no decreased breath sounds. He has no wheezes. He has rhonchi (few coarse breath sounds RML/RLL. ) in the right lower field. He has no rales.  Abdominal: Soft. There is no tenderness.  Musculoskeletal: He exhibits no edema.  Lymphadenopathy:    He has no cervical adenopathy.   Neurological: He is alert and oriented to person, place, and time.  Skin: Skin is warm and dry. No rash noted.  Psychiatric: He has a normal mood and affect. His behavior is normal.     [facility] reading (PRIMARY) by  Dr. Neva Seat: CXR: R hemidiaphragm elevation, ? Cardiomegaly vs incomplete inspiration. Few increased markings at bases.   Results for orders placed in visit on 05/25/13  POCT CBC      Result Value Range   WBC 11.3 (*) 4.6 - 10.2 K/uL   Lymph, poc 2.1  0.6 - 3.4   POC LYMPH PERCENT 18.6  10 - 50 %L   MID (cbc) 1.4 (*) 0 - 0.9   POC MID % 12.2 (*) 0 - 12 %M   POC Granulocyte 7.8 (*) 2 - 6.9   Granulocyte percent 69.2  37 - 80 %G   RBC 4.62 (*) 4.69 - 6.13 M/uL   Hemoglobin 14.5  14.1 - 18.1 g/dL   HCT, POC 16.1  09.6 - 53.7 %   MCV 95.7  80 - 97 fL   MCH, POC 31.4 (*) 27 - 31.2 pg   MCHC 32.8  31.8 - 35.4 g/dL   RDW, POC 04.5     Platelet Count, POC 73 (*) 142 - 424 K/uL   MPV 12.2  0 - 99.8 fL  ambulatory pulse ox: 94%.      Assessment & Plan:  [name] is a 72 y.o. male Cough - Plan: POCT CBC, [name] 2 View, HYDROcodone-homatropine (HYCODAN) 5-1.5 MG/5ML syrup  Fever and chills - Plan: POCT CBC, [name] 2 View  Dyspnea - Plan: POCT CBC, [name] 2 View  CAP (community acquired pneumonia) - Plan: azithromycin (ZITHROMAX) 250 MG tablet  Nasal and chest congestion, with cough, fever last night. Slight leukocytosis today.  difficult CXR interpretation, but suspect lower increased markings. Will treat for early CAP, with borderline hypoxia, and close follow up in 48 hours, but ER precautions discussed with hx of cardiac disease (denies hx of CHF, and not having chest pain), and s/sx's of CHF discussed. Understanding expressed.   Meds ordered this encounter  Medications  . HYDROcodone-homatropine (HYCODAN) 5-1.5 MG/5ML syrup    Sig: 80m by mouth a bedtime as needed for cough.    Dispense:  120 mL    Refill:  0  . azithromycin (ZITHROMAX) 250 MG tablet     Sig: Take 2 pills by mouth on day 1, then 1 pill by mouth per day on days 2 through 5.    Dispense:  6 each    Refill:  0   Patient Instructions  Your bloddcount and chest  xray suggest a possible pneumonia. Will start antibiotic today, Saline nasal spray atleast 4 times per day if needed for nasal congestion.  Over the counter mucinex or mucinex DM during day for cough. Cough syrup at night if needed for sleep, but if wheezing or short of breath, do not use the cough suppressant.  Return to the clinic or go to the nearest emergency room if any of your symptoms worsen or new symptoms occur, but plan on follow up with Dr. Neva Seat in 2 days as long as you are not worsening.  If you have any chest pain, more shortness of breath, or any worsening - go to an emergency room.

## 2013-05-27 ENCOUNTER — Ambulatory Visit (INDEPENDENT_AMBULATORY_CARE_PROVIDER_SITE_OTHER): Payer: Medicare Other | Admitting: Family Medicine

## 2013-05-27 VITALS — BP 118/68 | HR 68 | Temp 99.0°F | Resp 18 | Ht 67.0 in | Wt 191.0 lb

## 2013-05-27 DIAGNOSIS — R05 Cough: Secondary | ICD-10-CM

## 2013-05-27 DIAGNOSIS — J209 Acute bronchitis, unspecified: Secondary | ICD-10-CM | POA: Diagnosis not present

## 2013-05-27 NOTE — Progress Notes (Signed)
Subjective:  This chart was scribed for Meredith Staggers, MD by Quintella Reichert, ED scribe.  This patient was seen in room [facility] Room 14 and the patient's care was started at 7:29 PM.   Patient ID: [name], male    DOB: 1940-12-14, 72 y.o.   MRN: [id number]  HPI  [name] is a 72 y.o. male PCP: NADEL,SCOTT M, MD   Pt presents for follow-up after a pneumonia diagnosis. He was here 2 days ago for 4-5 days of cough productive of clear sputum with associated SOB, chest and sinus congestion, sore throat, sweats and chills. CBC and CXR suggested possible pneumonia and pt was placed on Zithromax and given Hycodan cough syrup.  He was also known to be borderline hypoxic at that visit, with ambulatory pulse ox of 94% on room air. He was advised to return for f/u today as long as not worsening, or to go to the ED for worsening symptoms.  He states that he feels he is slightly improved since then overall although he still has his cough and it is now "deeper," with associated bilateral rib pain on coughing.  Cough is worse when lying down and he has to sleep propped up, but no shortness of breath. He has been taking 5 mL Hycodan before bed but still has difficulty sleeping due to his cough.  However he states that his SOB has resolved.  In addition his chest pressure and chest congestion have been gradually improving.  He is still coughing up clear phlegm in the morning but less than before.  He has also seen significant relief in his nasal congestion from Mucinex DM.  His sore throat has resolved entirely.  He states he occasionally wakes up feeling cold and clammy but denies fever.  He denies any new symptoms including CP, leg swelling, or palpitations.  Pt notes that he had a similar illness several years ago and he feels that it cleared up more quickly that time.  His cough was productive of yellowish sputum on that occasion rather than clear as at present.   Patient Active Problem List   Diagnosis Date Noted  . NAFLD (nonalcoholic fatty liver disease) 62/13/0865  . Thrombocytopenia, unspecified 10/21/2012  . Exertional angina 09/03/2012  . Abnormal glucose 08/27/2011  . MALARIA 12/05/2009  . COLONIC POLYPS 12/05/2009  . HYPERCHOLESTEROLEMIA 12/05/2009  . ANXIETY 12/05/2009  . HEMORRHOIDS 12/05/2009  . ALLERGIC RHINITIS 12/05/2009  . ANKYLOSING SPONDYLITIS 12/05/2009  . GANGLION CYST, WRIST, LEFT 12/05/2009  . ROTATOR CUFF TEAR 12/05/2009  . OTHER DISORDER OF MUSCLE LIGAMENT AND FASCIA 12/05/2009  . ABNORMAL CHEST XRAY 12/05/2009  . CAD, NATIVE VESSEL 04/05/2009    Past Medical History  Diagnosis Date  . Coronary atherosclerosis of native coronary artery   . Hyperlipidemia     with low HDL  . Ankylosing spondylitis   . Hypertension   . Allergic rhinitis   . Cough   . Abnormal chest x-ray   . Colonic polyp   . Hemorrhoids   . Rotator cuff tear   . Ganglion cyst of wrist     left  . Other disorder of muscle, ligament, and fascia   . Anxiety   . History of malaria   . Arthritis     Past Surgical History  Procedure Laterality Date  . Rotator cuff repair      bilat by Dr. Madelon Lips  . Hemorrhoid surgery  10/10    in [place]    No Known Allergies  Prior to Admission medications   Medication Sig Start Date End Date Taking? Authorizing Provider  aspirin 81 MG EC tablet Take 81 mg by mouth at bedtime.    Yes Historical Provider, MD  azithromycin (ZITHROMAX) 250 MG tablet Take 2 pills by mouth on day 1, then 1 pill by mouth per day on days 2 through 5. 05/25/13  Yes Shade Flood, MD  carvedilol (COREG) 3.125 MG tablet TAKE ONE TABLET BY MOUTH TWICE DAILY WITH MEALS 05/08/13  Yes Tonny Bollman, MD  HYDROcodone-homatropine [facility]) 5-1.5 MG/5ML syrup 44m by mouth a bedtime as needed for cough. 05/25/13  Yes Shade Flood, MD  hydrOXYzine (ATARAX/VISTARIL) 25 MG tablet Take 1 tablet (25 mg total) by mouth every 6 (six) hours as needed for itching. 04/27/13   Yes Michele Mcalpine, MD  simvastatin (ZOCOR) 20 MG tablet Take 1 tablet (20 mg total) by mouth at bedtime. 12/31/12  Yes Tonny Bollman, MD    Review of Systems  Constitutional: Positive for chills. Negative for fever.  HENT: Positive for congestion (improving). Negative for sore throat.   Respiratory: Positive for cough. Negative for shortness of breath.        Rib pain on coughing Chest pressure improving  Cardiovascular: Negative for chest pain, palpitations and leg swelling.     BP 118/68  Pulse 68  Temp(Src) 99 F (37.2 C) (Oral)  Resp 18  Ht 5\' 7"  (1.702 m)  Wt 191 lb (86.637 kg)  BMI 29.91 kg/m2  SpO2 96%  Objective:   Physical Exam  Vitals reviewed. Constitutional: He is oriented to person, place, and time. He appears well-developed and well-nourished. No distress.  HENT:  Head: Normocephalic and atraumatic.  Mouth/Throat: Oropharynx is clear and moist and mucous membranes are normal. No oropharyngeal exudate, posterior oropharyngeal edema or posterior oropharyngeal erythema.  TMS pearly gray  Eyes: Conjunctivae and EOM are normal. Pupils are equal, round, and reactive to light.  Cardiovascular: Normal rate, regular rhythm and normal heart sounds.  Exam reveals no gallop and no friction rub.   No murmur heard. No ectopy  Pulmonary/Chest: Effort normal and breath sounds normal. Not tachypneic. No respiratory distress. He has no wheezes. He has no rhonchi. He has no rales.  No retractions  Lymphadenopathy:    He has no cervical adenopathy.  Neurological: He is alert and oriented to person, place, and time.  Skin: Skin is warm and dry.  Psychiatric: He has a normal mood and affect. His behavior is normal.      Assessment & Plan:  [name] is a 72 y.o. male Bronchitis, with prior borderline hypoxia.  Improved, afebrile, and improved pulse ox.  Continue Z pak, mucinex, and can increase Hycodan to 10ml qhs of needed. rtc precautions discussed and discussed CXR  results.    Patient Instructions  The radiologist did not see a pneumonia, but did note a bronchitis on your last chest xray.  Continue mucinex DM during the day and fluids, rest as able.  Ok to take up to 10ml of cough syrup at night if need to help cough and for sleep. Return to the clinic or go to the nearest emergency room if any of your symptoms worsen or new symptoms occur.  Bronchitis Bronchitis is the body's way of reacting to injury and/or infection (inflammation) of the bronchi. Bronchi are the air tubes that extend from the windpipe into the lungs. If the inflammation becomes severe, it may cause shortness of breath. CAUSES  Inflammation  may be caused by:  A virus.  Germs (bacteria).  Dust.  Allergens.  Pollutants and many other irritants. The cells lining the bronchial tree are covered with tiny hairs (cilia). These constantly beat upward, away from the lungs, toward the mouth. This keeps the lungs free of pollutants. When these cells become too irritated and are unable to do their job, mucus begins to develop. This causes the characteristic cough of bronchitis. The cough clears the lungs when the cilia are unable to do their job. Without either of these protective mechanisms, the mucus would settle in the lungs. Then you would develop pneumonia. Smoking is a common cause of bronchitis and can contribute to pneumonia. Stopping this habit is the single most important thing you can do to help yourself. TREATMENT   Your caregiver may prescribe an antibiotic if the cough is caused by bacteria. Also, medicines that open up your airways make it easier to breathe. Your caregiver may also recommend or prescribe an expectorant. It will loosen the mucus to be coughed up. Only take over-the-counter or prescription medicines for pain, discomfort, or fever as directed by your caregiver.  Removing whatever causes the problem (smoking, for example) is critical to preventing the problem from  getting worse.  Cough suppressants may be prescribed for relief of cough symptoms.  Inhaled medicines may be prescribed to help with symptoms now and to help prevent problems from returning.  For those with recurrent (chronic) bronchitis, there may be a need for steroid medicines. SEEK IMMEDIATE MEDICAL CARE IF:   During treatment, you develop more pus-like mucus (purulent sputum).  You have a fever.  Your baby is older than 3 months with a rectal temperature of 102 F (38.9 C) or higher.  Your baby is 56 months old or younger with a rectal temperature of 100.4 F (38 C) or higher.  You become progressively more ill.  You have increased difficulty breathing, wheezing, or shortness of breath. It is necessary to seek immediate medical care if you are elderly or sick from any other disease. MAKE SURE YOU:   Understand these instructions.  Will watch your condition.  Will get help right away if you are not doing well or get worse. Document Released: 07/29/2005 Document Revised: 10/21/2011 Document Reviewed: 06/07/2008 [facility] Patient Information 2014 [place], [place].   I personally performed the services described in this documentation, which was scribed in my presence. The recorded information has been reviewed and considered, and addended by me as needed.

## 2013-05-27 NOTE — Patient Instructions (Addendum)
The radiologist did not see a pneumonia, but did note a bronchitis on your last chest xray.  Continue mucinex DM during the day and fluids, rest as able.  Ok to take up to 10ml of cough syrup at night if need to help cough and for sleep. Return to the clinic or go to the nearest emergency room if any of your symptoms worsen or new symptoms occur.  Bronchitis Bronchitis is the body's way of reacting to injury and/or infection (inflammation) of the bronchi. Bronchi are the air tubes that extend from the windpipe into the lungs. If the inflammation becomes severe, it may cause shortness of breath. CAUSES  Inflammation may be caused by:  A virus.  Germs (bacteria).  Dust.  Allergens.  Pollutants and many other irritants. The cells lining the bronchial tree are covered with tiny hairs (cilia). These constantly beat upward, away from the lungs, toward the mouth. This keeps the lungs free of pollutants. When these cells become too irritated and are unable to do their job, mucus begins to develop. This causes the characteristic cough of bronchitis. The cough clears the lungs when the cilia are unable to do their job. Without either of these protective mechanisms, the mucus would settle in the lungs. Then you would develop pneumonia. Smoking is a common cause of bronchitis and can contribute to pneumonia. Stopping this habit is the single most important thing you can do to help yourself. TREATMENT   Your caregiver may prescribe an antibiotic if the cough is caused by bacteria. Also, medicines that open up your airways make it easier to breathe. Your caregiver may also recommend or prescribe an expectorant. It will loosen the mucus to be coughed up. Only take over-the-counter or prescription medicines for pain, discomfort, or fever as directed by your caregiver.  Removing whatever causes the problem (smoking, for example) is critical to preventing the problem from getting worse.  Cough suppressants may  be prescribed for relief of cough symptoms.  Inhaled medicines may be prescribed to help with symptoms now and to help prevent problems from returning.  For those with recurrent (chronic) bronchitis, there may be a need for steroid medicines. SEEK IMMEDIATE MEDICAL CARE IF:   During treatment, you develop more pus-like mucus (purulent sputum).  You have a fever.  Your baby is older than 3 months with a rectal temperature of 102 F (38.9 C) or higher.  Your baby is 12 months old or younger with a rectal temperature of 100.4 F (38 C) or higher.  You become progressively more ill.  You have increased difficulty breathing, wheezing, or shortness of breath. It is necessary to seek immediate medical care if you are elderly or sick from any other disease. MAKE SURE YOU:   Understand these instructions.  Will watch your condition.  Will get help right away if you are not doing well or get worse. Document Released: 07/29/2005 Document Revised: 10/21/2011 Document Reviewed: 06/07/2008 [facility] Patient Information 2014 [place], [place].

## 2013-06-04 DIAGNOSIS — L578 Other skin changes due to chronic exposure to nonionizing radiation: Secondary | ICD-10-CM | POA: Diagnosis not present

## 2013-06-04 DIAGNOSIS — L570 Actinic keratosis: Secondary | ICD-10-CM | POA: Diagnosis not present

## 2013-06-04 DIAGNOSIS — D485 Neoplasm of uncertain behavior of skin: Secondary | ICD-10-CM | POA: Diagnosis not present

## 2013-06-06 ENCOUNTER — Other Ambulatory Visit: Payer: Self-pay | Admitting: Cardiovascular Disease

## 2013-06-07 ENCOUNTER — Other Ambulatory Visit: Payer: Self-pay | Admitting: Cardiovascular Disease

## 2013-06-08 DIAGNOSIS — H43819 Vitreous degeneration, unspecified eye: Secondary | ICD-10-CM | POA: Diagnosis not present

## 2013-06-10 ENCOUNTER — Encounter (INDEPENDENT_AMBULATORY_CARE_PROVIDER_SITE_OTHER): Payer: Medicare Other | Admitting: Ophthalmology

## 2013-06-10 DIAGNOSIS — H43819 Vitreous degeneration, unspecified eye: Secondary | ICD-10-CM

## 2013-06-10 DIAGNOSIS — H3530 Unspecified macular degeneration: Secondary | ICD-10-CM | POA: Diagnosis not present

## 2013-06-10 DIAGNOSIS — H33309 Unspecified retinal break, unspecified eye: Secondary | ICD-10-CM

## 2013-06-10 DIAGNOSIS — H35039 Hypertensive retinopathy, unspecified eye: Secondary | ICD-10-CM

## 2013-06-10 DIAGNOSIS — H2510 Age-related nuclear cataract, unspecified eye: Secondary | ICD-10-CM

## 2013-06-10 DIAGNOSIS — I10 Essential (primary) hypertension: Secondary | ICD-10-CM

## 2013-06-14 ENCOUNTER — Other Ambulatory Visit: Payer: Self-pay

## 2013-06-14 MED ORDER — CARVEDILOL 3.125 MG PO TABS: ORAL_TABLET | ORAL | Status: DC

## 2013-06-16 ENCOUNTER — Ambulatory Visit: Payer: Medicare Other | Admitting: Cardiovascular Disease

## 2013-06-17 ENCOUNTER — Other Ambulatory Visit: Payer: Self-pay

## 2013-06-21 ENCOUNTER — Encounter (INDEPENDENT_AMBULATORY_CARE_PROVIDER_SITE_OTHER): Payer: Medicare Other | Admitting: Ophthalmology

## 2013-06-21 DIAGNOSIS — H33309 Unspecified retinal break, unspecified eye: Secondary | ICD-10-CM

## 2013-06-23 ENCOUNTER — Encounter: Payer: Self-pay | Admitting: Cardiovascular Disease

## 2013-06-23 ENCOUNTER — Ambulatory Visit (INDEPENDENT_AMBULATORY_CARE_PROVIDER_SITE_OTHER): Payer: Medicare Other | Admitting: Cardiovascular Disease

## 2013-06-23 VITALS — BP 148/70 | HR 54 | Ht 67.0 in | Wt 190.0 lb

## 2013-06-23 DIAGNOSIS — E7800 Pure hypercholesterolemia, unspecified: Secondary | ICD-10-CM

## 2013-06-23 DIAGNOSIS — I208 Other forms of angina pectoris: Secondary | ICD-10-CM

## 2013-06-23 DIAGNOSIS — I209 Angina pectoris, unspecified: Secondary | ICD-10-CM | POA: Diagnosis not present

## 2013-06-23 DIAGNOSIS — I2510 Atherosclerotic heart disease of native coronary artery without angina pectoris: Secondary | ICD-10-CM

## 2013-06-23 MED ORDER — CARVEDILOL 3.125 MG PO TABS: ORAL_TABLET | ORAL | Status: DC

## 2013-06-23 MED ORDER — TADALAFIL 10 MG PO TABS: 10.0000 mg | ORAL_TABLET | ORAL | Status: DC | PRN

## 2013-06-23 NOTE — Progress Notes (Signed)
HPI:  72 year old gentleman presenting for followup evaluation. The patient initially presented in 2008 with an anterior wall MI complicated by out of hospital ventricular fibrillation arrest. He underwent primary PCI with stenting of the LAD (bare-metal). His left ventricular function had normalized on followup. He had a repeat heart catheterization in January 2014 after presenting with CCS class II angina and an abnormal exercise treadmill study. This demonstrated continued patency of his LAD stent with nonobstructive disease in the right coronary artery and left circumflex. There was moderate stenosis in the diagonal branch and this was thought to be responsible for his anginal symptoms and treadmill abnormality. Medical therapy was recommended. Of significance the patient has ITP and his platelet count ranges between 60 - 80,000. Lipids one year ago showed a cholesterol of 124, triglycerides 104, HDL 38, and LDL 66.  The patient is doing well. Since his last visit he had a biceps tendon injury and required surgery. He had some extensive bruising but no serious bleeding problems. He denies chest pain or pressure. He does have some shortness of breath with activity. He's been physically active but not engaged in regular exercise. He denies edema, palpitations, lightheadedness, or syncope. His other complaints include erectile dysfunction and lack of energy.  Outpatient Encounter Prescriptions as of 06/23/2013  Medication Sig  . aspirin 81 MG EC tablet Take 81 mg by mouth at bedtime.   . carvedilol (COREG) 3.125 MG tablet TAKE ONE TABLET BY MOUTH TWICE DAILY WITH MEALS  . fluorouracil (EFUDEX) 5 % cream Apply topically daily.   . simvastatin (ZOCOR) 20 MG tablet Take 1 tablet (20 mg total) by mouth at bedtime.  . [DISCONTINUED] azithromycin (ZITHROMAX) 250 MG tablet Take 2 pills by mouth on day 1, then 1 pill by mouth per day on days 2 through 5.  . [DISCONTINUED] HYDROcodone-homatropine (HYCODAN)  5-1.5 MG/5ML syrup 57m by mouth a bedtime as needed for cough.  . [DISCONTINUED] hydrOXYzine (ATARAX/VISTARIL) 25 MG tablet Take 1 tablet (25 mg total) by mouth every 6 (six) hours as needed for itching.    No Known Allergies  Past Medical History  Diagnosis Date  . Coronary atherosclerosis of native coronary artery   . Hyperlipidemia     with low HDL  . Ankylosing spondylitis   . Hypertension   . Allergic rhinitis   . Cough   . Abnormal chest x-ray   . Colonic polyp   . Hemorrhoids   . Rotator cuff tear   . Ganglion cyst of wrist     left  . Other disorder of muscle, ligament, and fascia   . Anxiety   . History of malaria   . Arthritis    ROS: Negative except as per HPI  BP 148/70  Pulse 54  Ht 5\' 7"  (1.702 m)  Wt 190 lb (86.183 kg)  BMI 29.75 kg/m2  PHYSICAL EXAM: Pt is alert and oriented, NAD HEENT: normal Neck: JVP - normal, carotids 2+= without bruits Lungs: CTA bilaterally CV: RRR without murmur or gallop Abd: soft, NT, Positive BS, no hepatomegaly Ext: no C/C/E, distal pulses intact and equal Skin: warm/dry no rash  EKG:  Sinus bradycardia 54 beats per minute, within normal limits.  Cardiac catheterization 09/03/2012: Procedural Findings:  Hemodynamics:  AO 116/60  LV 114/11  Coronary angiography:  Coronary dominance: right  Left mainstem: Widely patent without obstructive disease.  Left anterior descending (LAD): Large vessel, wraps around the left ventricular apex. The stent in the proximal LAD is widely  patent. There is minor nonobstructive disease of 20-30% in the mid LAD. The first diagonal branch has significant disease. This is a moderate caliber vessel with 50% stenosis at the ostium and 75% stenosis in the mid vessel.  Left circumflex (LCx): The AV groove circumflex is patent. There is minor irregularity. The first OM branch is large. There is no significant stenosis but there are diffuse irregularities with up to 20-30% stenosis.  Right coronary  artery (RCA): This is a large, dominant vessel. The PDA and posterolateral branches are patent. The proximal vessel is patent with minor nonobstructive disease. The mid vessel has 40-50% stenosis. There is no high-grade disease throughout the course of the right coronary artery.  Left ventriculography: Left ventricular systolic function is normal, LVEF is estimated at 55-65%, there is no significant mitral regurgitation  Final Conclusions:  1. Single-vessel coronary artery disease with patency of the LAD stent and significant stenosis of a moderate caliber diagonal branch.  2. Minor nonobstructive disease of the left circumflex and right coronary arteries  3. Normal left ventricular function.  Recommendations: Considering the patient's mild anginal symptoms, preserved LV function, and thrombocytopenia, I think it is best to manage him medically. He has significant branch vessel stenosis of the diagonal but his major coronary arteries are patent. We could consider percutaneous treatment if his symptoms progress, I would favor medical therapy.  ASSESSMENT AND PLAN: 1. Coronary atherosclerosis, native vessel. The patient is stable without anginal symptoms. I reviewed his treadmill study and cardiac catheterization both done within the past year. He will continue on aspirin, simvastatin, and low dose carvedilol. I will see him back for followup evaluation in one year.  2. Hypertension. Blood pressure is elevated today. On my recheck his blood pressure was 154/74. He's had several office visits over the past year and we reviewed his blood pressure trend. Blood pressure control has been excellent with readings less than 140/80 greater than 90% of the time. No medication changes made today.  3. Erectile dysfunction. Cialis prescription was written. He understands that nitroglycerin is contraindicated within 24 hours of Cialis use.  4. Hyperlipidemia. Patient on low dose simvastatin. Lipids as  above.  Tonny Bollman 06/23/2013 10:42 AM

## 2013-06-23 NOTE — Patient Instructions (Addendum)
Your physician wants you to follow-up with Dr Excell Seltzer in one year.  You will receive a reminder letter in the mail two months in advance. If you don't receive a letter, please call our office to schedule the follow-up appointment.  Your physician has recommended you make the following change in your medication: Start Cialis 10mg . Take as directed for ED.

## 2013-06-25 DIAGNOSIS — T1490XA Injury, unspecified, initial encounter: Secondary | ICD-10-CM | POA: Diagnosis not present

## 2013-06-25 DIAGNOSIS — L570 Actinic keratosis: Secondary | ICD-10-CM | POA: Diagnosis not present

## 2013-08-08 ENCOUNTER — Other Ambulatory Visit: Payer: Self-pay | Admitting: Cardiovascular Disease

## 2013-08-09 ENCOUNTER — Telehealth: Payer: Self-pay

## 2013-08-09 ENCOUNTER — Other Ambulatory Visit: Payer: Self-pay

## 2013-08-09 DIAGNOSIS — E7800 Pure hypercholesterolemia, unspecified: Secondary | ICD-10-CM

## 2013-08-09 MED ORDER — SIMVASTATIN 20 MG PO TABS: 20.0000 mg | ORAL_TABLET | Freq: Every day | ORAL | Status: DC

## 2013-08-09 NOTE — Telephone Encounter (Signed)
Patient called wanting a refill for zocor sent rx to [facility] on [address]

## 2013-08-31 ENCOUNTER — Ambulatory Visit (INDEPENDENT_AMBULATORY_CARE_PROVIDER_SITE_OTHER): Payer: Medicare Other | Admitting: Internal Medicine

## 2013-08-31 VITALS — BP 126/62 | HR 59 | Temp 98.4°F | Resp 16 | Ht 66.0 in | Wt 189.0 lb

## 2013-08-31 DIAGNOSIS — J209 Acute bronchitis, unspecified: Secondary | ICD-10-CM | POA: Diagnosis not present

## 2013-08-31 DIAGNOSIS — J988 Other specified respiratory disorders: Secondary | ICD-10-CM

## 2013-08-31 DIAGNOSIS — J22 Unspecified acute lower respiratory infection: Secondary | ICD-10-CM

## 2013-08-31 MED ORDER — AZITHROMYCIN 500 MG PO TABS: 500.0000 mg | ORAL_TABLET | Freq: Every day | ORAL | Status: DC

## 2013-08-31 MED ORDER — HYDROCOD POLST-CHLORPHEN POLST 10-8 MG/5ML PO LQCR: 5.0000 mL | Freq: Two times a day (BID) | ORAL | Status: DC | PRN

## 2013-08-31 NOTE — Progress Notes (Signed)
   Subjective:    Patient ID: [name], male    DOB: 25-Oct-1940, 73 y.o.   MRN: [id number]  HPI    Review of Systems     Objective:   Physical Exam        Assessment & Plan:

## 2013-08-31 NOTE — Progress Notes (Signed)
   Subjective:    Patient ID: [name], male    DOB: 1940-12-29, 73 y.o.   MRN: [id number]  HPI  73 y.o. Male presents to clinic with sore throat x 2 days and sinus drainage . Denies any pressure through his face now, but notices congestions at night. Is having trouble with sinus drainage draining down back of throat. Has productive cough with yellow phlegm. Has increased vitamin c for symptoms and drinking hot liquids to help with sore throat. Denies any nausea vomiting, otalgia. Has feeling of tightness in his head due to sinuses.     Review of Systems     Objective:   Physical Exam        Assessment & Plan:

## 2013-08-31 NOTE — Patient Instructions (Signed)
Acute Bronchitis Bronchitis is inflammation of the airways that extend from the windpipe into the lungs (bronchi). The inflammation often causes mucus to develop. This leads to a cough, which is the most common symptom of bronchitis.  In acute bronchitis, the condition usually develops suddenly and goes away over time, usually in a couple weeks. Smoking, allergies, and asthma can make bronchitis worse. Repeated episodes of bronchitis may cause further lung problems.  CAUSES Acute bronchitis is most often caused by the same virus that causes a cold. The virus can spread from person to person (contagious).  SIGNS AND SYMPTOMS   Cough.   Fever.   Coughing up mucus.   Body aches.   Chest congestion.   Chills.   Shortness of breath.   Sore throat.  DIAGNOSIS  Acute bronchitis is usually diagnosed through a physical exam. Tests, such as chest X-rays, are sometimes done to rule out other conditions.  TREATMENT  Acute bronchitis usually goes away in a couple weeks. Often times, no medical treatment is necessary. Medicines are sometimes given for relief of fever or cough. Antibiotics are usually not needed but may be prescribed in certain situations. In some cases, an inhaler may be recommended to help reduce shortness of breath and control the cough. A cool mist vaporizer may also be used to help thin bronchial secretions and make it easier to clear the chest.  HOME CARE INSTRUCTIONS  Get plenty of rest.   Drink enough fluids to keep your urine clear or pale yellow (unless you have a medical condition that requires fluid restriction). Increasing fluids may help thin your secretions and will prevent dehydration.   Only take over-the-counter or prescription medicines as directed by your health care provider.   Avoid smoking and secondhand smoke. Exposure to cigarette smoke or irritating chemicals will make bronchitis worse. If you are a smoker, consider using nicotine gum or skin  patches to help control withdrawal symptoms. Quitting smoking will help your lungs heal faster.   Reduce the chances of another bout of acute bronchitis by washing your hands frequently, avoiding people with cold symptoms, and trying not to touch your hands to your mouth, nose, or eyes.   Follow up with your health care provider as directed.  SEEK MEDICAL CARE IF: Your symptoms do not improve after 1 week of treatment.  SEEK IMMEDIATE MEDICAL CARE IF:  You develop an increased fever or chills.   You have chest pain.   You have severe shortness of breath.  You have bloody sputum.   You develop dehydration.  You develop fainting.  You develop repeated vomiting.  You develop a severe headache. MAKE SURE YOU:   Understand these instructions.  Will watch your condition.  Will get help right away if you are not doing well or get worse. Document Released: 09/05/2004 Document Revised: 03/31/2013 Document Reviewed: 01/19/2013 [employer] Patient Information 2014 [employer].  

## 2013-10-14 ENCOUNTER — Ambulatory Visit (INDEPENDENT_AMBULATORY_CARE_PROVIDER_SITE_OTHER): Payer: Medicare Other | Admitting: Family Medicine

## 2013-10-14 VITALS — BP 120/72 | HR 65 | Temp 98.8°F | Resp 18 | Ht 67.0 in | Wt 193.0 lb

## 2013-10-14 DIAGNOSIS — M25519 Pain in unspecified shoulder: Secondary | ICD-10-CM

## 2013-10-14 DIAGNOSIS — L02414 Cutaneous abscess of left upper limb: Secondary | ICD-10-CM

## 2013-10-14 DIAGNOSIS — M25512 Pain in left shoulder: Secondary | ICD-10-CM

## 2013-10-14 DIAGNOSIS — IMO0002 Reserved for concepts with insufficient information to code with codable children: Secondary | ICD-10-CM | POA: Diagnosis not present

## 2013-10-14 MED ORDER — CEPHALEXIN 500 MG PO CAPS: 500.0000 mg | ORAL_CAPSULE | Freq: Three times a day (TID) | ORAL | Status: DC

## 2013-10-14 MED ORDER — SULFAMETHOXAZOLE-TRIMETHOPRIM 800-160 MG PO TABS: 1.0000 | ORAL_TABLET | Freq: Three times a day (TID) | ORAL | Status: DC

## 2013-10-14 NOTE — Progress Notes (Signed)
Subjective:    Patient ID: [name], male    DOB: May 20, 1941, 73 y.o.   MRN: [id number]  HPI This chart was scribed for Kristi Smith-MD by Celesta Gentile, Scribe. This patient was seen in room 2 and the patient's care was started at 7:57 PM.  HPI Comments: [name] is a 73 y.o. male who presents to the [facility] complaining of a possible insect bite over his left shoulder.  Pt states when he was drying off after his shower this morning he noticed the area draining blood and pus.  Pt denies pain around the area at the time, but states throughout the day the discomfort increased.  Pt doesn't recall a specific bite.  Pt states the last time the area drained was this morning.  He denies pain with movement of his shoulder.  Pt denies fever, chills, nausea, and emesis.  Pt applied hydrogen peroxide PTA.  Pt has an older scar over his left shoulder from a rotator cuff surgery.  Pt states aspirin, but denies other blood thinners.      Past Medical History  Diagnosis Date  . Coronary atherosclerosis of native coronary artery   . Hyperlipidemia     with low HDL  . Ankylosing spondylitis   . Hypertension   . Allergic rhinitis   . Cough   . Abnormal chest x-ray   . Colonic polyp   . Hemorrhoids   . Rotator cuff tear   . Ganglion cyst of wrist     left  . Other disorder of muscle, ligament, and fascia   . Anxiety   . History of malaria   . Arthritis   . Kidney stone   . Heart attack 2008    Past Surgical History  Procedure Laterality Date  . Rotator cuff repair      bilat by Dr. French Ana  . Hemorrhoid surgery  10/10    in [place]    Family History  Problem Relation Age of Onset  . Cancer Mother     melanoma  . Emphysema Mother   . Rheum arthritis Mother   . Diabetes Mother     History   Social History  . Marital Status: Married    Spouse Name: [name]    Number of Children: 2  . Years of Education: N/A   Occupational History  . Not on file.     Social History Main Topics  . Smoking status: Never Smoker   . Smokeless tobacco: Never Used  . Alcohol Use: No     Comment: occ 1 beer, wine occ  . Drug Use: No  . Sexual Activity: Yes   Other Topics Concern  . Not on file   Social History Narrative  . No narrative on file    No Known Allergies  Patient Active Problem List   Diagnosis Date Noted  . Benign mole 02/04/2014  . Impaired glucose tolerance 10/26/2013  . NAFLD (nonalcoholic fatty liver disease) 04/27/2013  . Thrombocytopenia, unspecified 10/21/2012  . Exertional angina 09/03/2012  . MALARIA 12/05/2009  . COLONIC POLYPS 12/05/2009  . HYPERCHOLESTEROLEMIA 12/05/2009  . ANXIETY 12/05/2009  . HEMORRHOIDS 12/05/2009  . ALLERGIC RHINITIS 12/05/2009  . ANKYLOSING SPONDYLITIS 12/05/2009  . GANGLION CYST, WRIST, LEFT 12/05/2009  . ROTATOR CUFF TEAR 12/05/2009  . OTHER DISORDER OF MUSCLE LIGAMENT AND FASCIA 12/05/2009  . ABNORMAL CHEST XRAY 12/05/2009  . CAD, NATIVE VESSEL 04/05/2009    Review of Systems  Constitutional: Negative for  fever, chills, diaphoresis and fatigue.  Respiratory: Negative for shortness of breath.   Cardiovascular: Negative for chest pain.  Gastrointestinal: Negative for nausea and vomiting.  Musculoskeletal: Negative for arthralgias.  Skin: Positive for color change and wound (left shoulder ). Negative for rash.  Neurological: Negative for headaches.  Psychiatric/Behavioral: Negative for behavioral problems and confusion.      Objective:   Physical Exam  Nursing note and vitals reviewed. Constitutional: He is oriented to person, place, and time. He appears well-developed and well-nourished. No distress.  HENT:  Head: Normocephalic and atraumatic.  Eyes: EOM are normal.  Neck: Neck supple. No tracheal deviation present.  Cardiovascular: Normal rate.   Pulmonary/Chest: Effort normal. No respiratory distress.  Musculoskeletal: Normal range of motion.  Neurological: He is alert and  oriented to person, place, and time.  Skin: Skin is warm and dry.  Left shoulder with 1 cm pustular lesion with fluctuance and surrounding erythema.    Psychiatric: He has a normal mood and affect. His behavior is normal.   Filed Vitals:   10/14/13 1925  BP: 120/72  Pulse: 65  Temp: 98.8 F (37.1 C)  TempSrc: Oral  Resp: 18  Height: 5\' 7"  (1.702 m)  Weight: 193 lb (87.544 kg)  SpO2: 97%    PROCEDURE:  Verbal consent obtained.  20 gauge needle advanced into wound with bloody drainage expressed when culture obtained.      Assessment & Plan:  Abscess of left shoulder - Plan: Wound culture  Pain in left shoulder   1. Pain L shoulder: New Secondary to abscess/carbuncle.  Recommend Tylenol PRN. 2.  Abscess/carbuncle L shoulder: New. Wound culture obtained; treat with Keflex and Bactrim.  RTC for fever, increasing pain, redness, swelling.  Recommend warm compresses to area bid for 15-20 minutes.   Meds ordered this encounter  Medications  . DISCONTD: cephALEXin (KEFLEX) 500 MG capsule    Sig: Take 1 capsule (500 mg total) by mouth 3 (three) times daily.    Dispense:  21 capsule    Refill:  0  . DISCONTD: sulfamethoxazole-trimethoprim (BACTRIM DS,SEPTRA DS) 800-160 MG per tablet    Sig: Take 1 tablet by mouth 3 (three) times daily.    Dispense:  21 tablet    Refill:  0     I personally performed the services described in this documentation, which was scribed in my presence.  The recorded information has been reviewed and is accurate.  Reginia Forts, M.D.  [facility] [address] [place], [place]  [address] [phone] phone [phone] fax

## 2013-10-14 NOTE — Patient Instructions (Addendum)
1.  Return for fever/chills/sweats. 2. Return for increased redness/swelling.  Abscess An abscess is an infected area that contains a collection of pus and debris.It can occur in almost any part of the body. An abscess is also known as a furuncle or boil. CAUSES  An abscess occurs when tissue gets infected. This can occur from blockage of oil or sweat glands, infection of hair follicles, or a minor injury to the skin. As the body tries to fight the infection, pus collects in the area and creates pressure under the skin. This pressure causes pain. People with weakened immune systems have difficulty fighting infections and get certain abscesses more often.  SYMPTOMS Usually an abscess develops on the skin and becomes a painful mass that is red, warm, and tender. If the abscess forms under the skin, you may feel a moveable soft area under the skin. Some abscesses break open (rupture) on their own, but most will continue to get worse without care. The infection can spread deeper into the body and eventually into the bloodstream, causing you to feel ill.  DIAGNOSIS  Your caregiver will take your medical history and perform a physical exam. A sample of fluid may also be taken from the abscess to determine what is causing your infection. TREATMENT  Your caregiver may prescribe antibiotic medicines to fight the infection. However, taking antibiotics alone usually does not cure an abscess. Your caregiver may need to make a small cut (incision) in the abscess to drain the pus. In some cases, gauze is packed into the abscess to reduce pain and to continue draining the area. HOME CARE INSTRUCTIONS   Only take over-the-counter or prescription medicines for pain, discomfort, or fever as directed by your caregiver.  If you were prescribed antibiotics, take them as directed. Finish them even if you start to feel better.  If gauze is used, follow your caregiver's directions for changing the gauze.  To avoid  spreading the infection:  Keep your draining abscess covered with a bandage.  Wash your hands well.  Do not share personal care items, towels, or whirlpools with others.  Avoid skin contact with others.  Keep your skin and clothes clean around the abscess.  Keep all follow-up appointments as directed by your caregiver. SEEK MEDICAL CARE IF:   You have increased pain, swelling, redness, fluid drainage, or bleeding.  You have muscle aches, chills, or a general ill feeling.  You have a fever. MAKE SURE YOU:   Understand these instructions.  Will watch your condition.  Will get help right away if you are not doing well or get worse. Document Released: 05/08/2005 Document Revised: 01/28/2012 Document Reviewed: 10/11/2011 [facility] Patient Information 2014 [place].

## 2013-10-17 LAB — WOUND CULTURE
GRAM STAIN: NONE SEEN
ORGANISM ID, BACTERIA: NO GROWTH

## 2013-10-19 ENCOUNTER — Ambulatory Visit (INDEPENDENT_AMBULATORY_CARE_PROVIDER_SITE_OTHER): Payer: Medicare Other | Admitting: Ophthalmology

## 2013-10-19 DIAGNOSIS — H3530 Unspecified macular degeneration: Secondary | ICD-10-CM

## 2013-10-19 DIAGNOSIS — H33309 Unspecified retinal break, unspecified eye: Secondary | ICD-10-CM

## 2013-10-19 DIAGNOSIS — H35039 Hypertensive retinopathy, unspecified eye: Secondary | ICD-10-CM

## 2013-10-19 DIAGNOSIS — I10 Essential (primary) hypertension: Secondary | ICD-10-CM | POA: Diagnosis not present

## 2013-10-19 DIAGNOSIS — H43819 Vitreous degeneration, unspecified eye: Secondary | ICD-10-CM

## 2013-10-19 DIAGNOSIS — H2510 Age-related nuclear cataract, unspecified eye: Secondary | ICD-10-CM

## 2013-10-20 ENCOUNTER — Encounter: Payer: Self-pay | Admitting: Family Medicine

## 2013-10-22 DIAGNOSIS — L570 Actinic keratosis: Secondary | ICD-10-CM | POA: Diagnosis not present

## 2013-10-25 ENCOUNTER — Encounter: Payer: Self-pay | Admitting: Internal Medicine

## 2013-10-25 ENCOUNTER — Telehealth: Payer: Self-pay | Admitting: Internal Medicine

## 2013-10-25 ENCOUNTER — Ambulatory Visit (HOSPITAL_BASED_OUTPATIENT_CLINIC_OR_DEPARTMENT_OTHER): Payer: Medicare Other | Admitting: Internal Medicine

## 2013-10-25 ENCOUNTER — Other Ambulatory Visit (HOSPITAL_BASED_OUTPATIENT_CLINIC_OR_DEPARTMENT_OTHER): Payer: Medicare Other

## 2013-10-25 VITALS — BP 120/66 | HR 64 | Temp 98.4°F | Resp 19 | Ht 67.0 in | Wt 190.7 lb

## 2013-10-25 DIAGNOSIS — D696 Thrombocytopenia, unspecified: Secondary | ICD-10-CM

## 2013-10-25 LAB — COMPREHENSIVE METABOLIC PANEL (CC13)
ALK PHOS: 63 U/L (ref 40–150)
ALT: 30 U/L (ref 0–55)
AST: 22 U/L (ref 5–34)
Albumin: 4.2 g/dL (ref 3.5–5.0)
Anion Gap: 10 mEq/L (ref 3–11)
BILIRUBIN TOTAL: 0.7 mg/dL (ref 0.20–1.20)
BUN: 21.6 mg/dL (ref 7.0–26.0)
CO2: 23 mEq/L (ref 22–29)
Calcium: 9.8 mg/dL (ref 8.4–10.4)
Chloride: 108 mEq/L (ref 98–109)
Creatinine: 0.9 mg/dL (ref 0.7–1.3)
GLUCOSE: 131 mg/dL (ref 70–140)
Potassium: 4.2 mEq/L (ref 3.5–5.1)
SODIUM: 142 meq/L (ref 136–145)
Total Protein: 7.1 g/dL (ref 6.4–8.3)

## 2013-10-25 LAB — CBC WITH DIFFERENTIAL/PLATELET
BASO%: 0.3 % (ref 0.0–2.0)
Basophils Absolute: 0 10*3/uL (ref 0.0–0.1)
EOS%: 0.3 % (ref 0.0–7.0)
Eosinophils Absolute: 0 10*3/uL (ref 0.0–0.5)
HCT: 43.8 % (ref 38.4–49.9)
HGB: 14.8 g/dL (ref 13.0–17.1)
LYMPH#: 1.2 10*3/uL (ref 0.9–3.3)
LYMPH%: 30.6 % (ref 14.0–49.0)
MCH: 30.6 pg (ref 27.2–33.4)
MCHC: 33.8 g/dL (ref 32.0–36.0)
MCV: 90.5 fL (ref 79.3–98.0)
MONO#: 0.5 10*3/uL (ref 0.1–0.9)
MONO%: 12.8 % (ref 0.0–14.0)
NEUT#: 2.1 10*3/uL (ref 1.5–6.5)
NEUT%: 56 % (ref 39.0–75.0)
Platelets: 77 10*3/uL — ABNORMAL LOW (ref 140–400)
RBC: 4.84 10*6/uL (ref 4.20–5.82)
RDW: 15.3 % — AB (ref 11.0–14.6)
WBC: 3.8 10*3/uL — ABNORMAL LOW (ref 4.0–10.3)
nRBC: 0 % (ref 0–0)

## 2013-10-25 LAB — LACTATE DEHYDROGENASE (CC13): LDH: 110 U/L — AB (ref 125–245)

## 2013-10-25 NOTE — Progress Notes (Signed)
[facility] [phone]   Fax:([phone]  OFFICE PROGRESS NOTE  Noralee Space, MD [place] [address]  DIAGNOSIS: Thrombocytopenia, most likely idiopathic thrombocytopenic purpura.  PRIOR THERAPY:None.  CURRENT THERAPY: observation.  INTERVAL HISTORY: [name] 73 y.o. male returns to the clinic today for followup visit. The patient is feeling fine today with no specific complaints. He denied having any significant bleeding issues, bruises or ecchymosis. The patient has no chest pain, shortness of breath, cough or hemoptysis. He denied having any significant weight loss or night sweats. He had bone marrow biopsy and aspirate performed recently and he is here for evaluation and discussion of his biopsy results.  MEDICAL HISTORY: Past Medical History  Diagnosis Date  . Coronary atherosclerosis of native coronary artery   . Hyperlipidemia     with low HDL  . Ankylosing spondylitis   . Hypertension   . Allergic rhinitis   . Cough   . Abnormal chest x-ray   . Colonic polyp   . Hemorrhoids   . Rotator cuff tear   . Ganglion cyst of wrist     left  . Other disorder of muscle, ligament, and fascia   . Anxiety   . History of malaria   . Arthritis     ALLERGIES:  has No Known Allergies.  MEDICATIONS:  Current Outpatient Prescriptions  Medication Sig Dispense Refill  . aspirin 81 MG EC tablet Take 81 mg by mouth at bedtime.       . carvedilol (COREG) 3.125 MG tablet TAKE ONE TABLET BY MOUTH TWICE DAILY WITH MEALS  60 tablet  11  . simvastatin (ZOCOR) 20 MG tablet Take 1 tablet (20 mg total) by mouth at bedtime.  30 tablet  6  . azithromycin (ZITHROMAX) 500 MG tablet Take 1 tablet (500 mg total) by mouth daily.  5 tablet  0  . cephALEXin (KEFLEX) 500 MG capsule Take 1 capsule (500 mg total) by mouth 3 (three) times daily.  21 capsule  0  . chlorpheniramine-HYDROcodone (TUSSIONEX PENNKINETIC ER) 10-8 MG/5ML LQCR Take 5 mLs  by mouth every 12 (twelve) hours as needed for cough (cough).  100 mL  0  . fluorouracil (EFUDEX) 5 % cream Apply topically daily.       [name] sulfamethoxazole-trimethoprim (BACTRIM DS,SEPTRA DS) 800-160 MG per tablet Take 1 tablet by mouth 3 (three) times daily.  21 tablet  0  . tadalafil (CIALIS) 10 MG tablet Take 1 tablet (10 mg total) by mouth as needed for erectile dysfunction.  10 tablet  2   No current facility-administered medications for this visit.    SURGICAL HISTORY:  Past Surgical History  Procedure Laterality Date  . Rotator cuff repair      bilat by Dr. French Ana  . Hemorrhoid surgery  10/10    in [place]    REVIEW OF SYSTEMS:  A comprehensive review of systems was negative.   PHYSICAL EXAMINATION: General appearance: alert, cooperative and no distress Head: Normocephalic, without obvious abnormality, atraumatic Neck: no adenopathy Lymph nodes: Cervical, supraclavicular, and axillary nodes normal. Resp: clear to auscultation bilaterally Cardio: regular rate and rhythm, S1, S2 normal, no murmur, click, rub or gallop GI: soft, non-tender; bowel sounds normal; no masses,  no organomegaly Extremities: extremities normal, atraumatic, no cyanosis or edema  ECOG PERFORMANCE STATUS: 0 - Asymptomatic  Blood pressure 120/66, pulse 64, temperature 98.4 F (36.9 C), temperature source Oral, resp. rate 19, height 5' 7"  (  1.702 m), weight 190 lb 11.2 oz (86.501 kg).  LABORATORY DATA: Lab Results  Component Value Date   WBC 11.3* 05/25/2013   HGB 14.5 05/25/2013   HCT 44.2 05/25/2013   MCV 95.7 05/25/2013   PLT 79* 04/26/2013      Chemistry      Component Value Date/Time   NA 142 09/30/2012 0949   NA 141 09/02/2012 1313   K 4.0 09/30/2012 0949   K 4.2 09/02/2012 1313   CL 106 09/30/2012 0949   CL 106 09/02/2012 1313   CO2 27 09/30/2012 0949   CO2 27 09/02/2012 1313   BUN 19.5 09/30/2012 0949   BUN 17 09/02/2012 1313   CREATININE 1.0 09/30/2012 0949   CREATININE 1.0 09/02/2012 1313       Component Value Date/Time   CALCIUM 9.4 09/30/2012 0949   CALCIUM 9.4 09/02/2012 1313   ALKPHOS 60 09/30/2012 0949   ALKPHOS 53 08/03/2012 1019   AST 26 09/30/2012 0949   AST 42* 08/03/2012 1019   ALT 40 09/30/2012 0949   ALT 62* 08/03/2012 1019   BILITOT 0.59 09/30/2012 0949   BILITOT 1.1 08/03/2012 1019     BONE MARROW REPORT FINAL DIAGNOSIS RADIOGRAPHIC STUDIES: No results found.  ASSESSMENT AND PLAN: This is a very pleasant 73 years old white male with thrombocytopenia most likely idiopathic thrombocytopenic purpura.  His platelets count are low at 77,000 but stable and the patient is currently asymptomatic  I recommended for him to continue on observation for now as he is currently asymptomatic. I would see him back for followup visit in 6 months with repeat CBC and LDH. I advised the patient to call me immediately if he has any significant bleeding issues, bruises or ecchymosis or if his platelets count to be less than 50,000.  All questions were answered. The patient knows to call the clinic with any problems, questions or concerns. We can certainly see the patient much sooner if necessary.  Disclaimer: This note was dictated with voice recognition software. Similar sounding words can inadvertently be transcribed and may not be corrected upon review.

## 2013-10-25 NOTE — Telephone Encounter (Signed)
Gave pt appt for lab and Md appt for September 2015

## 2013-10-26 ENCOUNTER — Other Ambulatory Visit (INDEPENDENT_AMBULATORY_CARE_PROVIDER_SITE_OTHER): Payer: Medicare Other

## 2013-10-26 ENCOUNTER — Ambulatory Visit (INDEPENDENT_AMBULATORY_CARE_PROVIDER_SITE_OTHER): Payer: Medicare Other | Admitting: Pulmonary Disease

## 2013-10-26 ENCOUNTER — Encounter: Payer: Self-pay | Admitting: Pulmonary Disease

## 2013-10-26 VITALS — BP 126/74 | HR 60 | Temp 97.2°F | Ht 68.0 in | Wt 192.4 lb

## 2013-10-26 DIAGNOSIS — F411 Generalized anxiety disorder: Secondary | ICD-10-CM

## 2013-10-26 DIAGNOSIS — N320 Bladder-neck obstruction: Secondary | ICD-10-CM | POA: Diagnosis not present

## 2013-10-26 DIAGNOSIS — R7302 Impaired glucose tolerance (oral): Secondary | ICD-10-CM

## 2013-10-26 DIAGNOSIS — M459 Ankylosing spondylitis of unspecified sites in spine: Secondary | ICD-10-CM

## 2013-10-26 DIAGNOSIS — R7309 Other abnormal glucose: Secondary | ICD-10-CM

## 2013-10-26 DIAGNOSIS — E7800 Pure hypercholesterolemia, unspecified: Secondary | ICD-10-CM | POA: Diagnosis not present

## 2013-10-26 DIAGNOSIS — I2510 Atherosclerotic heart disease of native coronary artery without angina pectoris: Secondary | ICD-10-CM

## 2013-10-26 DIAGNOSIS — D126 Benign neoplasm of colon, unspecified: Secondary | ICD-10-CM | POA: Diagnosis not present

## 2013-10-26 DIAGNOSIS — K760 Fatty (change of) liver, not elsewhere classified: Secondary | ICD-10-CM

## 2013-10-26 DIAGNOSIS — D696 Thrombocytopenia, unspecified: Secondary | ICD-10-CM

## 2013-10-26 DIAGNOSIS — K7689 Other specified diseases of liver: Secondary | ICD-10-CM

## 2013-10-26 LAB — PSA: PSA: 1.81 ng/mL (ref 0.10–4.00)

## 2013-10-26 LAB — TESTOSTERONE: Testosterone: 299.89 ng/dL — ABNORMAL LOW (ref 350.00–890.00)

## 2013-10-26 LAB — TSH: TSH: 3.16 u[IU]/mL (ref 0.35–5.50)

## 2013-10-26 LAB — LIPID PANEL
Cholesterol: 119 mg/dL (ref 0–200)
HDL: 39 mg/dL — AB (ref >39.00)
LDL Cholesterol: 69 mg/dL (ref 0–99)
TRIGLYCERIDES: 56 mg/dL (ref 0.0–149.0)
Total CHOL/HDL Ratio: 3
VLDL: 11.2 mg/dL (ref 0.0–40.0)

## 2013-10-26 NOTE — Patient Instructions (Signed)
Today we updated your med list in our EPIC system...    Continue your current medications the same...  Today we did your additional fasting blood work as discussed...    We will contact you w/ the results when available...   Let's get on track w/ our low carb diet & exercise program...  Call for any questions or if we can be of service in any way.Marland KitchenMarland Kitchen

## 2013-10-26 NOTE — Progress Notes (Signed)
Subjective:    Patient ID: [name], male    DOB: 1941/03/06, 73 y.o.   MRN: [id number]  HPI 73 y/o WM here for an ROV... he has mult medical problems & is followed regularly by DrBBrodie for Cards w/ hx of CAD, s/p AWMI w/ VFib arrest, subseq PTCA/ stent 1/08... he also has Hypercholesterolemia & followed in the [facility] on Simvastatin & Niaspan...  ~  June 01, 2012:  Yearly ROV & review> Chriss Czar tends to use the physicians at the [name] for his primary care needs & avail records indicate visits for tinea pedis, infected insect bite, cerumen impaction, etc...  We reviewed the following medical problems during today's yearly office visit>>     AR/ Cough/ AbnCXR> he has mild basilar fibrosis, nonprogressive, & sl elev right hemidiaph; stable- no change...    CAD> on ASA81, Coreg3.125Bid; s/p AWMI 2008 w/ PCI to LAD; followed by DrCooper, Cards- last seen 11/12, asymptomatic, stable, no changes made; he denies CP, palpit, dizzy, SOB, edema; he's been too sedentary and about to restart an exercise program...    Chol> on Simva40-1/2 daily; FLP shows TChol 124, TG 104, HDL 38, LDL 66    Colon polyps, Hems> doing satis- denies abd pain, n/v, c/d, blood seen; last colon was 2007 by DrPeters in [place] & f/u planned 31yr.    Elev LFTs> routine labs showed sl incr SGOT/ SGPT and Ultrasound showed prob fatty infiltration; rec weight reduction, diet Rx...    Ortho> Ankylosing Spondy> he is HLA B27 pos, hx rotator cuff tears w/ bilat surg, ganglion cyst surg; using OTC meds as needed...     Anxiety> under stress, wife has had 3 operations- right knee, hammer toes, etc... We reviewed prob list, meds, xrays and labs> see below for updates >>   CXR 10/13 showed normal heart size, clear lungs, chr elev right hemidiaph, DJD in Tspine...  Abd Sonar 10/13 showed normal GB, heterogeneous liver w/ incr echotexture c/w fatty infiltration, sm right renal cyst...  LABS 10/13:  FLP- at goals on Simva20;  Chems- wnl  x sl incr LFTs;  CBC- wnl x low plat at 76K;  TSH=2.62;  PSA=1.43   ~  October 23, 2012:  579moOV & Ron has noted some elev BSs- home checks betw 100-140, and lab today shows A1c=5.4; we discussed "Impaired fasting glucose" and treatment w/ low carb diet & wt reduction... We reviewed the following medical problems during today's office visit >>     AR/ Cough/ AbnCXR> he has mild basilar fibrosis, nonprogressive, & sl elev right hemidiaph; stable- no change...    CAD> on ASA81, Coreg3.125Bid; s/p AWMI 2008 w/ PCI to LAD; followed by DrCooper- he had Treadmill 1/14 "I failed it" w/ hypertensive BP response; then Cath w/ single vessel dis in LAD & branch, & lower level blockages in RCA/ Circ/ & normal LVF...    Chol> on Simva40-1/2 daily- recently decr further to 1074m by DrMohammed to see if his platelets improve; FLP 10/13 shows TChol 124, TG 104, HDL 38, LDL 66    Colon polyps, Hems> doing satis- denies abd pain, n/v, c/d, blood seen; last colon was 2007 by DrPeters in [place] & f/u planned 10y65yr  Elev LFTs> routine labs showed sl incr SGOT/ SGPT and Ultrasound showed prob fatty infiltration; rec weight reduction, diet Rx...    Ortho> Ankylosing Spondy> he is HLA B27 pos, hx rotator cuff tears w/ bilat surg, ganglion cyst surg; notes some LBP w/ walking;  using OTC meds as needed...     Anxiety> under stress, wife has had 3 operations- right knee, hammer toes, etc...    Thrombocytopenia> ?idiopathic, not assoc w/ bleeding diathesis; eval by DrMohammed & on Observation... We reviewed prob list, meds, xrays and labs> see below for updates >> he had the 2013 flu vaccine 10/13;  States he had Shingles vaccine prev ?date?  LABS 2-3-4/14: Chems- wnl x BS=198;  CBC- Hg=13.5, WBC=2.9, Plat=66K => repeats 61-73...  LABS 3/14:  A1c=5.4.[name].  ~  April 27, 2013:  10moROV & Ron has been stable in the interval- only c/o itching & we prescribed Atarax25 as needed... We reviewed the following medical problems during  today's office visit >>     AR/ Cough/ AbnCXR> he has mild basilar fibrosis, nonprogressive, & sl elev right hemidiaph; stable- no change...    CAD> on ASA81, Coreg3.125Bid; s/p AWMI 2008 w/ PCI to LAD; followed by DrCooper- he had Treadmill 1/14 "I failed it" w/ hypertensive BP response; then Cath w/ single vessel dis in LAD & branch, & lower level blockages in RCA/ Circ/ & normal LVF...    Chol> on Simva40-1/2 daily- recently decr further to 143md by DrMohammed to see if his platelets improve; FLP 10/13 shows TChol 124, TG 104, HDL 38, LDL 66    Colon polyps, Hems> doing satis- denies abd pain, n/v, c/d, blood seen; last colon was 2007 by DrPeters in [place] & f/u planned 1021yr   Elev LFTs> routine labs showed sl incr SGOT/ SGPT and Ultrasound showed prob fatty infiltration; rec weight reduction, diet Rx...    Ortho> Ankylosing Spondy> he is HLA B27 pos, hx rotator cuff tears w/ bilat surg, ganglion cyst surg; notes some LBP w/ walking; injured biceps tendon at work 5/14- DrChandler did surgery, then PT...    Anxiety> under stress, wife has had 3 operations- right knee, hammer toes, etc...    Thrombocytopenia> ?idiopathic (ITP), not assoc w/ bleeding diathesis; eval by DrMohammed 3/14 w/ bone marrow & on Observation... We reviewed prob list, meds, xrays and labs> see below for updates >> he wants to wait 61mo38mo f/u CXR & fasting labs...  ~  October 26, 2013:  61mo 107mo& Ron is stable but notes that his AS is starting to bother him, incr LBP- has TENS & uses OTC Advil... We reviewed the following medical problems during today's office visit >>     AR/ Cough/ AbnCXR> he has mild basilar fibrosis, nonprogressive, & sl elev right hemidiaph; stable- no change...    CAD> on ASA81, Coreg3.125Bid; s/p AWMI 2008 w/ PCI to LAD; followed by DrCooper (last seen 11/14)- he had Treadmill 1/14 w/ hypertensive BP response; then Cath w/ single vessel dis in LAD & branch, & lower level blockages in RCA/ Circ/ & normal  LVF...    Chol> on Simva40-1/2 daily and FLP 3/15 showed TChol 119, TG 56, HDL 39, LDL 69    Colon polyps, Hems> doing satis- denies abd pain, n/v, c/d, blood seen; last colon was 2007 by DrPeters in [place] & f/u planned 42yrs43yrElev LFTs> prev routine labs showed sl incr SGOT/ SGPT and Ultrasound showed prob fatty infiltration; rec weight reduction, diet Rx...    Ortho> Ankylosing Spondy> he is HLA B27 pos, hx rotator cuff tears w/ bilat surg, ganglion cyst surg; notes some LBP w/ walking; injured biceps tendon at work 5/14- DrChandler did surgery, then PT, exercises...    Anxiety> not on meds; under  stress, wife has had 3 operations- right knee, hammer toes, etc...    Thrombocytopenia> ?idiopathic (ITP), not assoc w/ bleeding diathesis; eval by DrMohammed 3/14 w/ bone marrow & on Observation and last seen 3/15; CBC 3/15 showed Plat= 77K... We reviewed prob list, meds, xrays and labs> see below for updates >>   LABS 3/15:  FLP- at goals on Simva20;  Chems- wnl x BS=131;  CBC- ok x Plat=77K;  TSH=3.16;  Testos=300 (350-890);  PSA=1.81... He will see DrLowne for Primary Care going forward...          Problem List:    ALLERGIC RHINITIS (ICD-477.9) - hx AR w/ +allergy testing to ragweed & dust in the past.  COUGH (ICD-786.2) - eval 3/11 for dry irritative cough, on Enalapril, & cough resolved off ACE Rx.  Hx of ABNORMAL CHEST XRAY (ICD-793.1) - hx mild basilar pulm fibrosis on old CXR/ CT scan in 2002... there is also mild nonprogressive elevation of the right hemidiaphragm on old films... ~  CXR 10/13 showed normal heart size, clear lungs, chr elev right hemidiaph, DJD in Tspine...  CAD, NATIVE VESSEL (ICD-414.01) - he had an AWMI 1/08 w/ VFib arrest, successful resus, urgent cath w/ PTCA/ stent to LAD by DrBBrodie... on ASA 68m/d, & CARVEDILOL 3.1280mid... prev Enalapril stopped due to cough & switched to Benicar> pt asked to stop Benicar due to BP= 100/54, weak/ no energy etc...  denies CP,  palpit, syncope, dyspnea, swelling, etc... ~  2DEcho 3/08 showed mild septal HK, w/ LVF at lower limit of norm. ~  Myoview 8/09 showed ? sm inferior MI vs diaph attenuation, no ischemia, EF= 56%. ~  EKG 12/12 showed SBrady, rate59, poor R progression V1-2... ~  Cardiac Cath 1/14 showed single vessel CAD> patent LAD stent & 20-30% in mid vessel & diagonal branch had 75% stenosis in mid-vessel; RCA had 40-50% midRCA stenosis; Circ w/ 20-30% irregularities; normal LVF; DrCooper Rec medical management...  HYPERCHOLESTEROLEMIA (ICD-272.0) - followed in the [facility] on SIMVASTATIN 4015m1/2 tab daily & NIASPAN 500m29m/d... ~  FLP 10/10 on Simva40+Niasp1000 showed TChol 94, TG 44, HDL 36, LDL 49... keep same Rx. ~  FLP 4/11 on Simva40+Niasp1000 showed TChol 61, TG 44, LDL 28, LDL 25... pt rec to decr to Simva20. ~  FLP [place] on Simva20+Niasp1000 showed TChol 103, TG 60, HDL 35, LDL 56... [facility] decr the Niaspan to 1/d. ~  4/12:  [facility] Clinic discontinued his Niacin Rx... ~  FLP [place]3 on simva20 showed TChol 114, TG 63, HDL 42, LDL 60 ~  FLP 10/13 on Simva20 showed TChol 124, TG 104, HDL 38, LDL 66  IMPAIRED FASTING GLUCOSE >> on low carb diet & needs to lose weight... ~  2014: BS as high as 200 but home BS checks ave 100-140; A1c done 3/14 = 5.4 & he is reassured, diet rx alone...  COLONIC POLYPS (ICD-211.3) ~  colonoscopy 11/02 by DrKaDemetra Shinerwed divertics, hems, otherw neg. ~  colonoscopy in [place] DrPeters 10/07 w/ 1 hyperplastic polyp removed> f/u planned 76yr40yrEMORRHOIDS (ICD-455.6) - he reports hemorrhoid surg 10/10 in [place]...  Prob mild FATTY LIVER DISEASE >> rec to get on diet, get wt down...  ~  Abd Sonar 10/13 showed normal gallbladder, normal ducts, sl incr liver echotexture may reflect fatty infiltration; 12mm 32mr pole right renal cyst...   ANKYLOSING SPONDYLITIS (ICD-720.0) - eval for Ortho DrMurphy, & Rheum DrDeveshwar w/ Dx of HLA B-27 pos Ankylosing Spondylitis> w/ mild  morning stiffness &  back pain prev treated w/ Celebrex=> pt stopped it because "it causes heart attacks"... Hx of ROTATOR CUFF TEAR (ICD-727.61) - hx bilat rotator cuff repairs by DrCaffrey GANGLION CYST, WRIST, LEFT (ICD-727.41) - new prob 4/11 & he will check w/ DrMurphy...  Hx of OTHER DISORDER OF MUSCLE LIGAMENT AND FASCIA (ICD-728.89) - he had a spont hemorrhage into his right thigh 1996  ANXIETY (ICD-300.00) - his son was killed in 1998  Hx of MALARIA (ICD-084.6) - he has malaria while in the service  LOW PLATELET COUNT >> ?etiology- Plats=76K & range 74-118, no bleeding diathesis, bruising, etc... ~  Abd Sonar 10/13 showed prob mild fatty liver & normal appearing spleen... ~  2/14: consult by DrMohammed for Heme/Onc => he did Bone Marrow Bx- hypercellular marrow w/ abundant megakaryocytes; Heme felt he prob has idiopathic thrombocytopathic purpura & Rec observation status... ~  Followed regularly by DrMohammed & plat counts in 2014 = 61-79K...  HEALTH MAINTENANCE: ~  GI:  followed by DrPeters in [place] & up to date on colonoscopies... ~  GU:  exam is neg and PSA= 2.64 ~  Immuniz:  he gets the Flu vaccine each Fall... had PNEUMOVAX in 2010 at age 51.   Past Surgical History  Procedure Laterality Date  . Rotator cuff repair      bilat by Dr. French Ana  . Hemorrhoid surgery  10/10    in [place]    Outpatient Encounter Prescriptions as of 10/26/2013  Medication Sig  . aspirin 81 MG EC tablet Take 81 mg by mouth at bedtime.   . carvedilol (COREG) 3.125 MG tablet TAKE ONE TABLET BY MOUTH TWICE DAILY WITH MEALS  . simvastatin (ZOCOR) 20 MG tablet Take 1 tablet (20 mg total) by mouth at bedtime.  . tadalafil (CIALIS) 10 MG tablet Take 1 tablet (10 mg total) by mouth as needed for erectile dysfunction.  . [DISCONTINUED] azithromycin (ZITHROMAX) 500 MG tablet Take 1 tablet (500 mg total) by mouth daily.  . [DISCONTINUED] cephALEXin (KEFLEX) 500 MG capsule Take 1 capsule (500 mg total) by mouth 3  (three) times daily.  . [DISCONTINUED] chlorpheniramine-HYDROcodone (TUSSIONEX PENNKINETIC ER) 10-8 MG/5ML LQCR Take 5 mLs by mouth every 12 (twelve) hours as needed for cough (cough).  . [DISCONTINUED] fluorouracil (EFUDEX) 5 % cream Apply topically daily.   . [DISCONTINUED] sulfamethoxazole-trimethoprim (BACTRIM DS,SEPTRA DS) 800-160 MG per tablet Take 1 tablet by mouth 3 (three) times daily.    No Known Allergies   Current Medications, Allergies, Past Medical History, Past Surgical History, Family History, and Social History were reviewed in [employer] record.     Review of Systems    The patient denies fever, chills, sweats, anorexia, fatigue, weakness, malaise, weight loss, sleep disorder, blurring, diplopia, eye irritation, eye discharge, vision loss, eye pain, photophobia, earache, ear discharge, tinnitus, decreased hearing, nasal congestion, nosebleeds, sore throat, hoarseness, chest pain, palpitations, syncope, dyspnea on exertion, orthopnea, PND, peripheral edema, cough, dyspnea at rest, excessive sputum, hemoptysis, wheezing, pleurisy, nausea, vomiting, diarrhea, constipation, change in bowel habits, abdominal pain, melena, hematochezia, jaundice, gas/bloating, indigestion/heartburn, dysphagia, odynophagia, dysuria, hematuria, urinary frequency, urinary hesitancy, nocturia, incontinence, back pain, joint pain, joint swelling, muscle cramps, muscle weakness, stiffness, arthritis, sciatica, restless legs, leg pain at night, leg pain with exertion, rash, itching, dryness, suspicious lesions, paralysis, paresthesias, seizures, tremors, vertigo, transient blindness, frequent falls, frequent headaches, difficulty walking, depression, anxiety, memory loss, confusion, cold intolerance, heat intolerance, polydipsia, polyphagia, polyuria, unusual weight change, abnormal bruising, bleeding, enlarged lymph nodes, urticaria,  allergic rash, hay fever, and recurrent infections.      Objective:   Physical Exam      WD, WN, 73 y/o WM in NAD... GENERAL:  Alert & oriented; pleasant & cooperative... HEENT:  [place]/AT, EOM-wnl, PERRLA, EACs-clear, TMs-wnl, NOSE-clear, THROAT-clear & wnl. NECK:  Supple w/ fairROM; no JVD; normal carotid impulses w/o bruits; no thyromegaly or nodules palpated; no lymphadenopathy. CHEST:  Clear to P & A; without wheezes/ rales/ or rhonchi. HEART:  Regular Rhythm; without murmurs/ rubs/ or gallops. ABDOMEN:  Soft & nontender; normal bowel sounds; no organomegaly or masses detected. RECTAL:  prostate 3+ smooth w/o nodules, rectal neg- stool heme neg. EXT: without deformities, mild arthritic changes; no varicose veins/ venous insuffic/ or edema. NEURO:  CN's intact; motor testing normal; sensory testing normal; gait normal & balance OK. DERM:  No lesions noted; no rash etc...  RADIOLOGY DATA:  Reviewed in the EPIC EMR & discussed w/ the patient...  LABORATORY DATA:  Reviewed in the EPIC EMR & discussed w/ the patient...   Assessment & Plan:    CAD>  followed by DrCooper & stable post cath 1/14 on ASA, BBlocker, Statin;  Continue Rx + diet & exercise...  CHOL>  Prev followed in the [facility] & they stopped his Niacin, on Sima20 w/ good FLP; DrMohammed decr the dose to 38m to check effect on plat ct.  GI> stable & up to date...  ORTHO>  Hx AS, etc> he uses Tylenol for pain & avoids NSAIDs as he thinks the Celebrex may have caused his MI...  Thrombocytopenia> low platelet counts w/o bleeding diathesis; Neg bone marrow, on observation....  Other medical issues as noted..Marland KitchenMarland Kitchen

## 2013-11-02 ENCOUNTER — Telehealth: Payer: Self-pay | Admitting: Pulmonary Disease

## 2013-11-02 DIAGNOSIS — R7989 Other specified abnormal findings of blood chemistry: Secondary | ICD-10-CM

## 2013-11-02 NOTE — Telephone Encounter (Signed)
Spoke with pt and advised of lab results per Dr Lenna Gilford.  Referral placed for urology  Per SN.  Also given number for [place] for pt to establish with new primary care.

## 2013-11-16 DIAGNOSIS — E291 Testicular hypofunction: Secondary | ICD-10-CM | POA: Diagnosis not present

## 2013-11-16 DIAGNOSIS — N529 Male erectile dysfunction, unspecified: Secondary | ICD-10-CM | POA: Diagnosis not present

## 2013-11-16 DIAGNOSIS — N400 Enlarged prostate without lower urinary tract symptoms: Secondary | ICD-10-CM | POA: Diagnosis not present

## 2014-01-20 ENCOUNTER — Telehealth: Payer: Self-pay

## 2014-01-20 NOTE — Telephone Encounter (Signed)
NEW PATIENT Previous PCP: Dr. Lenna Gilford  Medication List and allergies:  Reviewed and updated  90 day supply/mail order: n/a Local prescriptions:  [facility] - [place] ([place]), [place] - [place]  Immunization due:  UTD  A/P: No changes to personal, family or [place] Flu- 07/12/13 Tdap- 06/04/11 PNA- 08/22/09 Shingles- 05/17/08 CCS- 08/12/05- benign polyps per patient--Provider was a "provider in [employer]" PSA- 10/26/13--1.81   To discuss with provider: Nothing at this time.

## 2014-01-21 ENCOUNTER — Encounter: Payer: Self-pay | Admitting: Family Medicine

## 2014-01-21 ENCOUNTER — Ambulatory Visit (INDEPENDENT_AMBULATORY_CARE_PROVIDER_SITE_OTHER): Payer: Medicare Other | Admitting: Family Medicine

## 2014-01-21 VITALS — BP 110/60 | HR 50 | Temp 98.2°F | Ht 68.0 in | Wt 192.4 lb

## 2014-01-21 DIAGNOSIS — E785 Hyperlipidemia, unspecified: Secondary | ICD-10-CM | POA: Diagnosis not present

## 2014-01-21 DIAGNOSIS — R7309 Other abnormal glucose: Secondary | ICD-10-CM

## 2014-01-21 DIAGNOSIS — M459 Ankylosing spondylitis of unspecified sites in spine: Secondary | ICD-10-CM | POA: Diagnosis not present

## 2014-01-21 DIAGNOSIS — I2510 Atherosclerotic heart disease of native coronary artery without angina pectoris: Secondary | ICD-10-CM | POA: Diagnosis not present

## 2014-01-21 DIAGNOSIS — R739 Hyperglycemia, unspecified: Secondary | ICD-10-CM

## 2014-01-21 LAB — BASIC METABOLIC PANEL
BUN: 19 mg/dL (ref 6–23)
CHLORIDE: 105 meq/L (ref 96–112)
CO2: 29 meq/L (ref 19–32)
CREATININE: 0.8 mg/dL (ref 0.4–1.5)
Calcium: 9.6 mg/dL (ref 8.4–10.5)
GFR: 97.18 mL/min (ref >60.00)
Glucose, Bld: 86 mg/dL (ref 70–99)
Potassium: 4.1 mEq/L (ref 3.5–5.1)
SODIUM: 141 meq/L (ref 135–145)

## 2014-01-21 LAB — HEMOGLOBIN A1C: HEMOGLOBIN A1C: 5.5 % (ref 4.6–6.5)

## 2014-01-21 NOTE — Patient Instructions (Signed)

## 2014-01-21 NOTE — Progress Notes (Signed)
Pre visit review using our clinic review tool, if applicable. No additional management support is needed unless otherwise documented below in the visit note. 

## 2014-01-21 NOTE — Progress Notes (Signed)
   Subjective:    Patient ID: [name], male    DOB: 01/04/1941, 73 y.o.   MRN: [id number]  HPI Pt here to establish care and discuss meds.  No complaints.    Review of Systems As above  Past Medical History  Diagnosis Date  . Coronary atherosclerosis of native coronary artery   . Hyperlipidemia     with low HDL  . Ankylosing spondylitis   . Hypertension   . Allergic rhinitis   . Cough   . Abnormal chest x-ray   . Colonic polyp   . Hemorrhoids   . Rotator cuff tear   . Ganglion cyst of wrist     left  . Other disorder of muscle, ligament, and fascia   . Anxiety   . History of malaria   . Arthritis   . Kidney stone   . Heart attack 2008   History   Social History  . Marital Status: Married    Spouse Name: [name]    Number of Children: 2  . Years of Education: N/A   Occupational History  . Not on file.   Social History Main Topics  . Smoking status: Never Smoker   . Smokeless tobacco: Never Used  . Alcohol Use: No     Comment: occ 1 beer, wine occ  . Drug Use: No  . Sexual Activity: Yes   Other Topics Concern  . Not on file   Social History Narrative  . No narrative on file   Family History  Problem Relation Age of Onset  . Cancer Mother     melanoma  . Emphysema Mother   . Rheum arthritis Mother   . Diabetes Mother    Current Outpatient Prescriptions  Medication Sig Dispense Refill  . aspirin 81 MG EC tablet Take 81 mg by mouth at bedtime.       . carvedilol (COREG) 3.125 MG tablet TAKE ONE TABLET BY MOUTH TWICE DAILY WITH MEALS  60 tablet  11  . simvastatin (ZOCOR) 20 MG tablet Take 1 tablet (20 mg total) by mouth at bedtime.  30 tablet  6  . tadalafil (CIALIS) 10 MG tablet Take 1 tablet (10 mg total) by mouth as needed for erectile dysfunction.  10 tablet  2   No current facility-administered medications for this visit.       Objective:   Physical Exam  BP 110/60  Pulse 50  Temp(Src) 98.2 F (36.8 C) (Oral)  Ht 5\' 8"  (1.727  m)  Wt 192 lb 6.4 oz (87.272 kg)  BMI 29.26 kg/m2  SpO2 97% General appearance: alert, cooperative, appears stated age and no distress Throat: lips, mucosa, and tongue normal; teeth and gums normal Neck: no adenopathy, no carotid bruit, no JVD, supple, symmetrical, trachea midline and thyroid not enlarged, symmetric, no tenderness/mass/nodules Lungs: clear to auscultation bilaterally Heart: regular rate and rhythm, S1, S2 normal, no murmur, click, rub or gallop Extremities: extremities normal, atraumatic, no cyanosis or edema Sensory exam of the foot is normal, tested with the monofilament. Good pulses, no lesions or ulcers, good peripheral pulses.       Assessment & Plan:  1. Hyperglycemia Check labs - Basic metabolic panel - Hemoglobin A1c  2. Other and unspecified hyperlipidemia con't meds con't diet

## 2014-01-22 NOTE — Assessment & Plan Note (Signed)
Per cards  

## 2014-01-22 NOTE — Assessment & Plan Note (Signed)
Pt c/o pain but has been taking nsaids---  Pt also states celebrex caused him to have MI Advised pt to not take nsaid and try tylenol arthritis---offered ultram but pt denied

## 2014-02-04 ENCOUNTER — Ambulatory Visit (INDEPENDENT_AMBULATORY_CARE_PROVIDER_SITE_OTHER): Payer: Medicare Other | Admitting: Family Medicine

## 2014-02-04 ENCOUNTER — Encounter: Payer: Self-pay | Admitting: Family Medicine

## 2014-02-04 VITALS — BP 100/60 | HR 54 | Temp 98.7°F | Wt 192.4 lb

## 2014-02-04 DIAGNOSIS — D239 Other benign neoplasm of skin, unspecified: Secondary | ICD-10-CM

## 2014-02-04 DIAGNOSIS — L280 Lichen simplex chronicus: Secondary | ICD-10-CM | POA: Diagnosis not present

## 2014-02-04 DIAGNOSIS — D229 Melanocytic nevi, unspecified: Secondary | ICD-10-CM | POA: Insufficient documentation

## 2014-02-04 NOTE — Addendum Note (Signed)
Addended by: Ewing Schlein on: 02/04/2014 09:57 AM   Modules accepted: Orders

## 2014-02-04 NOTE — Progress Notes (Signed)
Pre visit review using our clinic review tool, if applicable. No additional management support is needed unless otherwise documented below in the visit note. 

## 2014-02-04 NOTE — Assessment & Plan Note (Signed)
Pt tolerated procedure well rto 1 week for suture removal

## 2014-02-04 NOTE — Progress Notes (Signed)
Patient ID: [name], male   DOB: September 01, 1940, 74 y.o.   MRN: [id number] After informed written consent was obtained, using Betadine for cleansing and 1% Lidocaine without epinephrine for anesthetic, with sterile technique a 4 mm punch biopsy was used to obtain a biopsy specimen of the lesion. Hemostasis was obtained by pressure and wound was  Sutured with 3.0 ethilon x 1. Antibiotic dressing is applied, and wound care instructions provided. Be alert for any signs of cutaneous infection. The specimen is labeled and sent to pathology for evaluation. The procedure was well tolerated without complications.

## 2014-03-06 ENCOUNTER — Other Ambulatory Visit: Payer: Self-pay | Admitting: Cardiovascular Disease

## 2014-03-15 DIAGNOSIS — H40039 Anatomical narrow angle, unspecified eye: Secondary | ICD-10-CM | POA: Diagnosis not present

## 2014-03-15 DIAGNOSIS — H16229 Keratoconjunctivitis sicca, not specified as Sjogren's, unspecified eye: Secondary | ICD-10-CM | POA: Diagnosis not present

## 2014-04-07 ENCOUNTER — Ambulatory Visit (INDEPENDENT_AMBULATORY_CARE_PROVIDER_SITE_OTHER): Payer: Medicare Other | Admitting: Emergency Medicine

## 2014-04-07 VITALS — BP 122/58 | HR 59 | Temp 98.4°F | Resp 14 | Ht 67.0 in | Wt 189.8 lb

## 2014-04-07 DIAGNOSIS — J209 Acute bronchitis, unspecified: Secondary | ICD-10-CM | POA: Diagnosis not present

## 2014-04-07 DIAGNOSIS — I2510 Atherosclerotic heart disease of native coronary artery without angina pectoris: Secondary | ICD-10-CM | POA: Diagnosis not present

## 2014-04-07 DIAGNOSIS — J018 Other acute sinusitis: Secondary | ICD-10-CM | POA: Diagnosis not present

## 2014-04-07 MED ORDER — PROMETHAZINE-CODEINE 6.25-10 MG/5ML PO SYRP: 5.0000 mL | ORAL_SOLUTION | Freq: Four times a day (QID) | ORAL | Status: DC | PRN

## 2014-04-07 MED ORDER — AMOXICILLIN-POT CLAVULANATE 875-125 MG PO TABS: 1.0000 | ORAL_TABLET | Freq: Two times a day (BID) | ORAL | Status: DC

## 2014-04-07 MED ORDER — PSEUDOEPHEDRINE-GUAIFENESIN ER 60-600 MG PO TB12: 1.0000 | ORAL_TABLET | Freq: Two times a day (BID) | ORAL | Status: DC

## 2014-04-07 NOTE — Progress Notes (Signed)
[facility] [address], [place] [address] [phone]  Date:  04/07/2014   Name:  [name]   DOB:  July 14, 1941   MRN:  [id number]  PCP:  Noralee Space, MD    Chief Complaint: Nasal Congestion, Sore Throat, Otalgia, Cough, Headache, Generalized Body Aches and Anorexia   History of Present Illness:  [name] is a 73 y.o. very pleasant male patient who presents with the following:  Ill past few days with nasal congestion and drainage and post nasal drip.  Has a cough productive of purulent sputum.  No wheezing or shortness of breath. No fever or chills Cough worse at night with sore throat No nausea or vomiting.  No stool change.  No rash. No improvement with over the counter medications or other home remedies. Denies other complaint or health concern today.   Patient Active Problem List   Diagnosis Date Noted  . Benign mole 02/04/2014  . Impaired glucose tolerance 10/26/2013  . NAFLD (nonalcoholic fatty liver disease) 04/27/2013  . Thrombocytopenia, unspecified 10/21/2012  . Exertional angina 09/03/2012  . MALARIA 12/05/2009  . COLONIC POLYPS 12/05/2009  . HYPERCHOLESTEROLEMIA 12/05/2009  . ANXIETY 12/05/2009  . HEMORRHOIDS 12/05/2009  . ALLERGIC RHINITIS 12/05/2009  . ANKYLOSING SPONDYLITIS 12/05/2009  . GANGLION CYST, WRIST, LEFT 12/05/2009  . ROTATOR CUFF TEAR 12/05/2009  . OTHER DISORDER OF MUSCLE LIGAMENT AND FASCIA 12/05/2009  . ABNORMAL CHEST XRAY 12/05/2009  . CAD, NATIVE VESSEL 04/05/2009    Past Medical History  Diagnosis Date  . Coronary atherosclerosis of native coronary artery   . Hyperlipidemia     with low HDL  . Ankylosing spondylitis   . Hypertension   . Allergic rhinitis   . Cough   . Abnormal chest x-ray   . Colonic polyp   . Hemorrhoids   . Rotator cuff tear   . Ganglion cyst of wrist     left  . Other disorder of muscle, ligament, and fascia   . Anxiety   . History of malaria   . Arthritis   .  Kidney stone   . Heart attack 2008    Past Surgical History  Procedure Laterality Date  . Rotator cuff repair      bilat by Dr. French Ana  . Hemorrhoid surgery  10/10    in [place]    History  Substance Use Topics  . Smoking status: Never Smoker   . Smokeless tobacco: Never Used  . Alcohol Use: No     Comment: occ 1 beer, wine occ    Family History  Problem Relation Age of Onset  . Cancer Mother     melanoma  . Emphysema Mother   . Rheum arthritis Mother   . Diabetes Mother     No Known Allergies  Medication list has been reviewed and updated.  Current Outpatient Prescriptions on File Prior to Visit  Medication Sig Dispense Refill  . aspirin 81 MG EC tablet Take 81 mg by mouth at bedtime.       . carvedilol (COREG) 3.125 MG tablet TAKE ONE TABLET BY MOUTH TWICE DAILY WITH MEALS  60 tablet  11  . simvastatin (ZOCOR) 20 MG tablet TAKE ONE TABLET BY MOUTH AT BEDTIME  30 tablet  6  . tadalafil (CIALIS) 10 MG tablet Take 1 tablet (10 mg total) by mouth as needed for erectile dysfunction.  10 tablet  2   No current facility-administered medications on file prior to visit.  Review of Systems:  As per HPI, otherwise negative.    Physical Examination: Filed Vitals:   04/07/14 1218  BP: 122/58  Pulse: 59  Temp: 98.4 F (36.9 C)  Resp: 14   Filed Vitals:   04/07/14 1218  Height: 5\' 7"  (1.702 m)  Weight: 189 lb 12.8 oz (86.093 kg)   Body mass index is 29.72 kg/(m^2). Ideal Body Weight: Weight in (lb) to have BMI = 25: 159.3  GEN: WDWN, NAD, Non-toxic, A & O x 3 HEENT: Atraumatic, Normocephalic. Neck supple. No masses, No LAD. Ears and Nose: No external deformity. CV: RRR, No M/G/R. No JVD. No thrill. No extra heart sounds. PULM: CTA B, no wheezes, crackles, rhonchi. No retractions. No resp. distress. No accessory muscle use. ABD: S, NT, ND, +BS. No rebound. No HSM. EXTR: No c/c/e NEURO Normal gait.  PSYCH: Normally interactive. Conversant. Not depressed or  anxious appearing.  Calm demeanor.    Assessment and Plan: Sinusitis Bronchitis Phen c cod mucinex d augmentin   Signed,  Ellison Carwin, MD

## 2014-04-07 NOTE — Patient Instructions (Signed)

## 2014-04-27 ENCOUNTER — Other Ambulatory Visit (HOSPITAL_BASED_OUTPATIENT_CLINIC_OR_DEPARTMENT_OTHER): Payer: Medicare Other

## 2014-04-27 ENCOUNTER — Ambulatory Visit (HOSPITAL_BASED_OUTPATIENT_CLINIC_OR_DEPARTMENT_OTHER): Payer: Medicare Other | Admitting: Internal Medicine

## 2014-04-27 ENCOUNTER — Telehealth: Payer: Self-pay | Admitting: Internal Medicine

## 2014-04-27 ENCOUNTER — Encounter: Payer: Self-pay | Admitting: Internal Medicine

## 2014-04-27 VITALS — BP 139/66 | HR 55 | Temp 98.1°F | Resp 18 | Ht 67.0 in | Wt 188.6 lb

## 2014-04-27 DIAGNOSIS — D696 Thrombocytopenia, unspecified: Secondary | ICD-10-CM

## 2014-04-27 LAB — CBC WITH DIFFERENTIAL/PLATELET
BASO%: 0 % (ref 0.0–2.0)
BASOS ABS: 0 10*3/uL (ref 0.0–0.1)
EOS%: 0.3 % (ref 0.0–7.0)
Eosinophils Absolute: 0 10*3/uL (ref 0.0–0.5)
HCT: 41.4 % (ref 38.4–49.9)
HGB: 13.8 g/dL (ref 13.0–17.1)
LYMPH#: 1.2 10*3/uL (ref 0.9–3.3)
LYMPH%: 32.9 % (ref 14.0–49.0)
MCH: 30.3 pg (ref 27.2–33.4)
MCHC: 33.3 g/dL (ref 32.0–36.0)
MCV: 91 fL (ref 79.3–98.0)
MONO#: 0.7 10*3/uL (ref 0.1–0.9)
MONO%: 18.8 % — ABNORMAL HIGH (ref 0.0–14.0)
NEUT#: 1.8 10*3/uL (ref 1.5–6.5)
NEUT%: 48 % (ref 39.0–75.0)
Platelets: 67 10*3/uL — ABNORMAL LOW (ref 140–400)
RBC: 4.55 10*6/uL (ref 4.20–5.82)
RDW: 14.8 % — AB (ref 11.0–14.6)
WBC: 3.8 10*3/uL — ABNORMAL LOW (ref 4.0–10.3)
nRBC: 0 % (ref 0–0)

## 2014-04-27 LAB — LACTATE DEHYDROGENASE (CC13): LDH: 109 U/L — AB (ref 125–245)

## 2014-04-27 NOTE — Telephone Encounter (Signed)
gv adn pritned appt sched and avs for pt for March 2016

## 2014-04-27 NOTE — Progress Notes (Signed)
[facility] Telephone:([phone]   Fax:([phone]  OFFICE PROGRESS NOTE  Noralee Space, MD [place] [address]  DIAGNOSIS: Thrombocytopenia, most likely idiopathic thrombocytopenic purpura.  PRIOR THERAPY:None.  CURRENT THERAPY: observation.  INTERVAL HISTORY: [name] 73 y.o. male returns to the clinic today for routine six-month followup visit. The patient is feeling fine today with no specific complaints. He denied having any significant bleeding issues, bruises or ecchymosis. The patient has no chest pain, shortness of breath, cough or hemoptysis. He denied having any significant weight loss or night sweats. He has repeat CBC performed earlier today and is here for evaluation and discussion of his lab results.  MEDICAL HISTORY: Past Medical History  Diagnosis Date  . Coronary atherosclerosis of native coronary artery   . Hyperlipidemia     with low HDL  . Ankylosing spondylitis   . Hypertension   . Allergic rhinitis   . Cough   . Abnormal chest x-ray   . Colonic polyp   . Hemorrhoids   . Rotator cuff tear   . Ganglion cyst of wrist     left  . Other disorder of muscle, ligament, and fascia   . Anxiety   . History of malaria   . Arthritis   . Kidney stone   . Heart attack 2008    ALLERGIES:  has No Known Allergies.  MEDICATIONS:  Current Outpatient Prescriptions  Medication Sig Dispense Refill  . aspirin 81 MG EC tablet Take 81 mg by mouth at bedtime.       . carvedilol (COREG) 3.125 MG tablet TAKE ONE TABLET BY MOUTH TWICE DAILY WITH MEALS  60 tablet  11  . simvastatin (ZOCOR) 20 MG tablet TAKE ONE TABLET BY MOUTH AT BEDTIME  30 tablet  6  . tadalafil (CIALIS) 10 MG tablet Take 1 tablet (10 mg total) by mouth as needed for erectile dysfunction.  10 tablet  2   No current facility-administered medications for this visit.    SURGICAL HISTORY:  Past Surgical History  Procedure Laterality Date  . Rotator cuff  repair      bilat by Dr. French Ana  . Hemorrhoid surgery  10/10    in [place]    REVIEW OF SYSTEMS:  A comprehensive review of systems was negative.   PHYSICAL EXAMINATION: General appearance: alert, cooperative and no distress Head: Normocephalic, without obvious abnormality, atraumatic Neck: no adenopathy Lymph nodes: Cervical, supraclavicular, and axillary nodes normal. Resp: clear to auscultation bilaterally Cardio: regular rate and rhythm, S1, S2 normal, no murmur, click, rub or gallop GI: soft, non-tender; bowel sounds normal; no masses,  no organomegaly Extremities: extremities normal, atraumatic, no cyanosis or edema  ECOG PERFORMANCE STATUS: 0 - Asymptomatic  Blood pressure 139/66, pulse 55, temperature 98.1 F (36.7 C), temperature source Oral, resp. rate 18, height 5\' 7"  (1.702 m), weight 188 lb 9.6 oz (85.548 kg), SpO2 97.00%.  LABORATORY DATA: Lab Results  Component Value Date   WBC 3.8* 04/27/2014   HGB 13.8 04/27/2014   HCT 41.4 04/27/2014   MCV 91.0 04/27/2014   PLT 67* 04/27/2014      Chemistry      Component Value Date/Time   NA 141 01/21/2014 1127   NA 142 10/25/2013 1146   K 4.1 01/21/2014 1127   K 4.2 10/25/2013 1146   CL 105 01/21/2014 1127   CL 106 09/30/2012 0949   CO2 29 01/21/2014 1127   CO2 23 10/25/2013  1146   BUN 19 01/21/2014 1127   BUN 21.6 10/25/2013 1146   CREATININE 0.8 01/21/2014 1127   CREATININE 0.9 10/25/2013 1146      Component Value Date/Time   CALCIUM 9.6 01/21/2014 1127   CALCIUM 9.8 10/25/2013 1146   ALKPHOS 63 10/25/2013 1146   ALKPHOS 53 08/03/2012 1019   AST 22 10/25/2013 1146   AST 42* 08/03/2012 1019   ALT 30 10/25/2013 1146   ALT 62* 08/03/2012 1019   BILITOT 0.70 10/25/2013 1146   BILITOT 1.1 08/03/2012 1019     BONE MARROW REPORT FINAL DIAGNOSIS RADIOGRAPHIC STUDIES: No results found.  ASSESSMENT AND PLAN: This is a very pleasant 73 years old white male with thrombocytopenia most likely idiopathic thrombocytopenic purpura.  His  platelets count are low at 67,000 and the patient is currently asymptomatic  I recommended for him to continue on observation for now as he is currently asymptomatic. I would see him back for followup visit in 6 months with repeat CBC and LDH. I advised the patient to call me immediately if he has any significant bleeding issues, bruises or ecchymosis or if his platelets count to be less than 50,000.  All questions were answered. The patient knows to call the clinic with any problems, questions or concerns. We can certainly see the patient much sooner if necessary.  Disclaimer: This note was dictated with voice recognition software. Similar sounding words can inadvertently be transcribed and may not be corrected upon review.

## 2014-04-29 ENCOUNTER — Ambulatory Visit: Payer: Medicare Other | Admitting: Family Medicine

## 2014-04-29 ENCOUNTER — Ambulatory Visit (INDEPENDENT_AMBULATORY_CARE_PROVIDER_SITE_OTHER): Payer: Medicare Other | Admitting: Family Medicine

## 2014-04-29 ENCOUNTER — Telehealth: Payer: Self-pay | Admitting: Family Medicine

## 2014-04-29 ENCOUNTER — Encounter: Payer: Self-pay | Admitting: Family Medicine

## 2014-04-29 VITALS — BP 139/66 | HR 58 | Temp 97.9°F | Wt 191.4 lb

## 2014-04-29 DIAGNOSIS — I2510 Atherosclerotic heart disease of native coronary artery without angina pectoris: Secondary | ICD-10-CM | POA: Diagnosis not present

## 2014-04-29 DIAGNOSIS — L570 Actinic keratosis: Secondary | ICD-10-CM | POA: Diagnosis not present

## 2014-04-29 DIAGNOSIS — M545 Low back pain, unspecified: Secondary | ICD-10-CM | POA: Diagnosis not present

## 2014-04-29 DIAGNOSIS — E785 Hyperlipidemia, unspecified: Secondary | ICD-10-CM

## 2014-04-29 DIAGNOSIS — I10 Essential (primary) hypertension: Secondary | ICD-10-CM | POA: Diagnosis not present

## 2014-04-29 DIAGNOSIS — S139XXS Sprain of joints and ligaments of unspecified parts of neck, sequela: Secondary | ICD-10-CM

## 2014-04-29 DIAGNOSIS — L578 Other skin changes due to chronic exposure to nonionizing radiation: Secondary | ICD-10-CM | POA: Diagnosis not present

## 2014-04-29 DIAGNOSIS — B351 Tinea unguium: Secondary | ICD-10-CM | POA: Diagnosis not present

## 2014-04-29 MED ORDER — TIZANIDINE HCL 4 MG PO TABS: 4.0000 mg | ORAL_TABLET | Freq: Four times a day (QID) | ORAL | Status: DC | PRN

## 2014-04-29 NOTE — Progress Notes (Signed)
   Subjective:    Patient ID: [name], male    DOB: 11-Feb-1941, 73 y.o.   MRN: [id number]  HPI Pt here to f/u bp and cholesterol.  He is not fasting.  Pt also c/o back pain and neck pain since starting to play golf every day again.    Review of Systems   as above Objective:   Physical Exam BP 139/66  Pulse 58  Temp(Src) 97.9 F (36.6 C) (Oral)  Wt 191 lb 5.8 oz (86.8 kg)  SpO2 97% General appearance: alert, cooperative, appears stated age and no distress Neck: no adenopathy, supple, symmetrical, trachea midline and thyroid not enlarged, symmetric, no tenderness/mass/nodules Lungs: clear to auscultation bilaterally Heart: S1, S2 normal Extremities: extremities normal, atraumatic, no cyanosis or edema Neurologic: Alert and oriented X 3, normal strength and tone. Normal symmetric reflexes. Normal coordination and gait       Assessment & Plan:  1. Other and unspecified hyperlipidemia Check labs, con't meds - Basic metabolic panel; Future - Hemoglobin A1c; Future - Hepatic function panel; Future - Lipid panel; Future - Microalbumin / creatinine urine ratio; Future - POCT urinalysis dipstick; Future  2. Essential hypertension Stable, con't meds - Basic metabolic panel; Future - Hemoglobin A1c; Future - Hepatic function panel; Future - Lipid panel; Future - Microalbumin / creatinine urine ratio; Future - POCT urinalysis dipstick; Future  3. Back pain at L4-L5 level  - tiZANidine (ZANAFLEX) 4 MG tablet; Take 1 tablet (4 mg total) by mouth every 6 (six) hours as needed for muscle spasms.  Dispense: 30 tablet; Refill: 0  4. Cervical sprain, sequela  - tiZANidine (ZANAFLEX) 4 MG tablet; Take 1 tablet (4 mg total) by mouth every 6 (six) hours as needed for muscle spasms.  Dispense: 30 tablet; Refill: 0

## 2014-04-29 NOTE — Progress Notes (Signed)
Pre visit review using our clinic review tool, if applicable. No additional management support is needed unless otherwise documented below in the visit note. 

## 2014-04-29 NOTE — Patient Instructions (Signed)

## 2014-04-29 NOTE — Telephone Encounter (Signed)
Caller name: Roanld Relation to pt: Call back number:[phone] Pharmacy:  Reason for call:  Pt states he was in the office and had a wart or growth removed in 01/2014 and never heard anything.  Please call pt with results.

## 2014-05-02 NOTE — Telephone Encounter (Signed)
Notes Recorded by Rosalita Chessman, DO on 02/09/2014 at 8:33 AM Benign wart   LMOM for a return call.      KP

## 2014-05-03 NOTE — Telephone Encounter (Signed)
Patient aware and voiced understanding.      KP 

## 2014-05-03 NOTE — Telephone Encounter (Signed)
Pt returned your call please call mobile [phone]

## 2014-05-04 ENCOUNTER — Other Ambulatory Visit: Payer: Medicare Other

## 2014-05-09 DIAGNOSIS — Z23 Encounter for immunization: Secondary | ICD-10-CM | POA: Diagnosis not present

## 2014-05-23 ENCOUNTER — Telehealth: Payer: Self-pay | Admitting: *Deleted

## 2014-05-23 NOTE — Telephone Encounter (Signed)
Pt called wanting to know if he had a lab appt scheduled with Dr Vista Mink for today.  Informed pt that he has labs scheduled in March 2016, and it appears his PCP has lab work ordered for him so he should contact their office.  He verbalized understanding.

## 2014-05-31 ENCOUNTER — Ambulatory Visit (INDEPENDENT_AMBULATORY_CARE_PROVIDER_SITE_OTHER): Payer: Medicare Other

## 2014-05-31 ENCOUNTER — Ambulatory Visit (INDEPENDENT_AMBULATORY_CARE_PROVIDER_SITE_OTHER): Payer: Medicare Other | Admitting: Family Medicine

## 2014-05-31 VITALS — BP 120/62 | HR 58 | Temp 98.6°F | Resp 16 | Ht 67.0 in | Wt 189.0 lb

## 2014-05-31 DIAGNOSIS — R0989 Other specified symptoms and signs involving the circulatory and respiratory systems: Secondary | ICD-10-CM | POA: Diagnosis not present

## 2014-05-31 DIAGNOSIS — R1314 Dysphagia, pharyngoesophageal phase: Secondary | ICD-10-CM | POA: Diagnosis not present

## 2014-05-31 DIAGNOSIS — H1033 Unspecified acute conjunctivitis, bilateral: Secondary | ICD-10-CM | POA: Diagnosis not present

## 2014-05-31 DIAGNOSIS — I2510 Atherosclerotic heart disease of native coronary artery without angina pectoris: Secondary | ICD-10-CM | POA: Diagnosis not present

## 2014-05-31 MED ORDER — BECLOMETHASONE DIPROPIONATE 40 MCG/ACT IN AERS: 2.0000 | INHALATION_SPRAY | Freq: Two times a day (BID) | RESPIRATORY_TRACT | Status: DC

## 2014-05-31 MED ORDER — POLYMYXIN B-TRIMETHOPRIM 10000-0.1 UNIT/ML-% OP SOLN: 1.0000 [drp] | OPHTHALMIC | Status: DC

## 2014-05-31 NOTE — Progress Notes (Signed)
[facility] [address], [place] [address] [phone]  Date:  05/31/2014   Name:  [name]   DOB:  1941-07-22   MRN:  [id number]  PCP:  Garnet Koyanagi, DO    Chief Complaint: Nasal Congestion, Sore Throat and Cough   History of Present Illness:  [name] is a 73 y.o. very pleasant male patient who presents with the following:  Here today to evaluate illness.  He was seen here in late August with sinusitis.  He was treated with augmentin, mucinex, codeine cough syrup. He states that his sinus congestion did resolve but he continued to have chest congestion and cough productive of clear phlegm.  This was improved but never resolved. However the main reason that he is here is that he feels a "painful obstruction" in his throat that he has noted for about 2 days. This makes it painful and difficult to swallow food. He does not have as much trouble swallowing liquids, but he does feel "tenderness" still.  He has never had this in the past. He is not aware of any pills getting stuck in his throat or any other trauma.  His breathing is not affected;.    He has never been a tobacco user.  He is a light drinker  He also notes that his eyes have been crusting with discharge over the last 2-3 days. His vision is ok.  He wears glasses but never contacts   Patient Active Problem List   Diagnosis Date Noted  . Benign mole 02/04/2014  . Impaired glucose tolerance 10/26/2013  . NAFLD (nonalcoholic fatty liver disease) 04/27/2013  . Thrombocytopenia, unspecified 10/21/2012  . Exertional angina 09/03/2012  . MALARIA 12/05/2009  . COLONIC POLYPS 12/05/2009  . HYPERCHOLESTEROLEMIA 12/05/2009  . ANXIETY 12/05/2009  . HEMORRHOIDS 12/05/2009  . ALLERGIC RHINITIS 12/05/2009  . ANKYLOSING SPONDYLITIS 12/05/2009  . GANGLION CYST, WRIST, LEFT 12/05/2009  . ROTATOR CUFF TEAR 12/05/2009  . OTHER DISORDER OF MUSCLE LIGAMENT AND FASCIA 12/05/2009  . ABNORMAL CHEST  XRAY 12/05/2009  . CAD, NATIVE VESSEL 04/05/2009    Past Medical History  Diagnosis Date  . Coronary atherosclerosis of native coronary artery   . Hyperlipidemia     with low HDL  . Ankylosing spondylitis   . Hypertension   . Allergic rhinitis   . Cough   . Abnormal chest x-ray   . Colonic polyp   . Hemorrhoids   . Rotator cuff tear   . Ganglion cyst of wrist     left  . Other disorder of muscle, ligament, and fascia   . Anxiety   . History of malaria   . Arthritis   . Kidney stone   . Heart attack 2008    Past Surgical History  Procedure Laterality Date  . Rotator cuff repair      bilat by Dr. French Ana  . Hemorrhoid surgery  10/10    in [place]    History  Substance Use Topics  . Smoking status: Never Smoker   . Smokeless tobacco: Never Used  . Alcohol Use: No     Comment: occ 1 beer, wine occ    Family History  Problem Relation Age of Onset  . Cancer Mother     melanoma  . Emphysema Mother   . Rheum arthritis Mother   . Diabetes Mother     No Known Allergies  Medication list has been reviewed and updated.  Current Outpatient Prescriptions on File  Prior to Visit  Medication Sig Dispense Refill  . aspirin 81 MG EC tablet Take 81 mg by mouth at bedtime.       . carvedilol (COREG) 3.125 MG tablet TAKE ONE TABLET BY MOUTH TWICE DAILY WITH MEALS  60 tablet  11  . simvastatin (ZOCOR) 20 MG tablet TAKE ONE TABLET BY MOUTH AT BEDTIME  30 tablet  6  . tadalafil (CIALIS) 10 MG tablet Take 1 tablet (10 mg total) by mouth as needed for erectile dysfunction.  10 tablet  2  . tiZANidine (ZANAFLEX) 4 MG tablet Take 1 tablet (4 mg total) by mouth every 6 (six) hours as needed for muscle spasms.  30 tablet  0   No current facility-administered medications on file prior to visit.    Review of Systems:  As per HPI- otherwise negative.   Physical Examination: Filed Vitals:   05/31/14 1123  BP: 120/62  Pulse: 58  Temp: 98.6 F (37 C)  Resp: 16   Filed Vitals:    05/31/14 1123  Height: 5\' 7"  (1.702 m)  Weight: 189 lb (85.73 kg)   Body mass index is 29.59 kg/(m^2). Ideal Body Weight: Weight in (lb) to have BMI = 25: 159.3  GEN: WDWN, NAD, Non-toxic, A & O x 3, overweight, looks well HEENT: Atraumatic, Normocephalic. Neck supple. No masses, No LAD.  Bilateral TM wnl, oropharynx normal.  PEERL,EOMI.   Ears and Nose: No external deformity. CV: RRR, No M/G/R. No JVD. No thrill. No extra heart sounds. PULM: CTA B, no wheezes, crackles, rhonchi. No retractions. No resp. distress. No accessory muscle use. EXTR: No c/c/e NEURO Normal gait.  PSYCH: Normally interactive. Conversant. Not depressed or anxious appearing.  Calm demeanor.   [facility] reading (PRIMARY) by  Dr. Lorelei Pont. CXR: stable elevation of right hemi- diaphragm.  No infiltarte Neck soft tissue:   Mild degenerative change, no soft tissue abnormaltiy.   CHEST 2 VIEW  COMPARISON: 05/25/2013  FINDINGS: There is elevation of the right diaphragm. There is no focal parenchymal opacity, pleural effusion, or pneumothorax. There is mild stable cardiomegaly.  There is anterior bridging osteophytes throughout the thoracic spine as can be seen with diffuse idiopathic skeletal hyperostosis.  IMPRESSION: No active cardiopulmonary disease.  NECK SOFT TISSUES - 1+ VIEW  COMPARISON: None.  FINDINGS: There is no evidence of retropharyngeal soft tissue swelling or epiglottic enlargement. The cervical airway is unremarkable and no radio-opaque foreign body identified.  IMPRESSION: No definite abnormality seen in the soft tissues of the neck.   Assessment and Plan: Chest congestion - Plan: [name] 2 View, beclomethasone (QVAR) 40 MCG/ACT inhaler  Dysphagia, pharyngoesophageal phase - Plan: DG Neck Soft Tissue, Ambulatory referral to ENT  Conjunctivitis, acute, bilateral - Plan: trimethoprim-polymyxin b (POLYTRIM) ophthalmic solution  He continues to have chronic chest congestion. No evidence  of pneumonia.  Will have him try a qvar inhaler.   polytrim for possible early conjunctivitis.   Will refer to ENT for dysphagia/ globus sensation. He may need a scope  Signed Lamar Blinks, MD

## 2014-05-31 NOTE — Patient Instructions (Signed)
Try taking 1-2 inhalations of the Qvar twice a day for your chronic chest congestion- let me know if this is not helpful for you.   Use the eye drop for your crusty eyes; if this is not better in 2 days or so please let me know I will set you up to see an ENT doctor for your swallowing issue

## 2014-06-02 ENCOUNTER — Telehealth: Payer: Self-pay

## 2014-06-02 DIAGNOSIS — R0989 Other specified symptoms and signs involving the circulatory and respiratory systems: Secondary | ICD-10-CM

## 2014-06-02 NOTE — Telephone Encounter (Signed)
The beclomethasone (QVAR) 40 MCG/ACT inhaler  is too expensive.  Patient is requesting something cheaper.   [phone]

## 2014-06-03 NOTE — Telephone Encounter (Signed)
Pt called again to follow up on his request for an alternative inhaler prescription. [phone]

## 2014-06-05 NOTE — Telephone Encounter (Signed)
Called pt- he was not able to pick up qvar as it was too expensive.  At this point he has congestion and some cough but does not feel ill, no fever.  No history of DM.  Will call and either find alternative inhaler rx or rx prednisone for him when his pharm is open in the am.  Apologized to him for the unacceptable delay in my addressing his concern and assured him we will work to improve this process.  Let him know that I will contact staff manager to address this issue immediately

## 2014-06-05 NOTE — Telephone Encounter (Signed)
I last checked my computer this afternoon around 2pm.  I am just getting this message from 3 days ago and second message from 2 days ago.  This is not acceptable- need to troubleshoot this delay.  It is nearly 9pm; will call him now to address issue and apologize for delay.  JC 8:44 pm 10/25

## 2014-06-05 NOTE — Telephone Encounter (Addendum)
Called him back 10/26.  Found a voucher for asmanex inhaler which gives him a fee 30 day trial. Will fax this and also an rx for omnicef as he continues to have chest congestion and productive cough.  He is seeing ENT tomorrow for his globus.  He will let us know if he needs anything else or if not getting better

## 2014-06-06 MED ORDER — MOMETASONE FUROATE 220 MCG/INH IN AEPB: 1.0000 | INHALATION_SPRAY | Freq: Every day | RESPIRATORY_TRACT | Status: DC

## 2014-06-06 MED ORDER — CEFDINIR 300 MG PO CAPS: 300.0000 mg | ORAL_CAPSULE | Freq: Two times a day (BID) | ORAL | Status: DC

## 2014-06-07 DIAGNOSIS — R070 Pain in throat: Secondary | ICD-10-CM | POA: Diagnosis not present

## 2014-06-10 DIAGNOSIS — H2513 Age-related nuclear cataract, bilateral: Secondary | ICD-10-CM | POA: Diagnosis not present

## 2014-06-10 DIAGNOSIS — H43813 Vitreous degeneration, bilateral: Secondary | ICD-10-CM | POA: Diagnosis not present

## 2014-06-10 DIAGNOSIS — H3531 Nonexudative age-related macular degeneration: Secondary | ICD-10-CM | POA: Diagnosis not present

## 2014-06-24 ENCOUNTER — Ambulatory Visit: Payer: Medicare Other | Admitting: Cardiovascular Disease

## 2014-07-08 ENCOUNTER — Other Ambulatory Visit: Payer: Self-pay | Admitting: Cardiovascular Disease

## 2014-07-21 ENCOUNTER — Encounter (HOSPITAL_COMMUNITY): Payer: Self-pay | Admitting: Cardiovascular Disease

## 2014-08-07 ENCOUNTER — Other Ambulatory Visit: Payer: Self-pay | Admitting: Cardiovascular Disease

## 2014-08-08 NOTE — Telephone Encounter (Signed)
Rx(s) sent to pharmacy electronically. OV 08/19/2014

## 2014-08-19 ENCOUNTER — Ambulatory Visit (INDEPENDENT_AMBULATORY_CARE_PROVIDER_SITE_OTHER): Payer: Medicare Other | Admitting: Cardiovascular Disease

## 2014-08-19 ENCOUNTER — Encounter: Payer: Self-pay | Admitting: Cardiovascular Disease

## 2014-08-19 VITALS — BP 136/78 | HR 48 | Ht 67.0 in | Wt 187.2 lb

## 2014-08-19 DIAGNOSIS — R001 Bradycardia, unspecified: Secondary | ICD-10-CM

## 2014-08-19 DIAGNOSIS — I2510 Atherosclerotic heart disease of native coronary artery without angina pectoris: Secondary | ICD-10-CM | POA: Diagnosis not present

## 2014-08-19 NOTE — Progress Notes (Addendum)
Background: The patient is followed for coronary artery disease. The patient initially presented in 2008 with an anterior wall MI complicated by out of hospital ventricular fibrillation arrest. He underwent primary PCI with stenting of the LAD (bare-metal). His left ventricular function had normalized on followup. He had a repeat heart catheterization in January 2014 after presenting with CCS class II angina and an abnormal exercise treadmill study. This demonstrated continued patency of his LAD stent with nonobstructive disease in the right coronary artery and left circumflex. There was moderate stenosis in the diagonal branch and this was thought to be responsible for his anginal symptoms and treadmill abnormality. Medical therapy was recommended.   Other medical problems include hypertension, hyperlipidemia, and ITP.  HPI:  74 year old gentleman presenting for follow-up evaluation.he was last seen in November 2014.  Overall the patient is doing okay. He denies chest pain or pressure. He does complain of generalized fatigue which she's noticed over 3-4 months. He sleeping more and doesn't have as much energy. He has not been engaged in routine exercise. The patient has mild exertionaldyspnea but no orthopnea, PND, or leg swelling.  Studies:  Cardiac catheterization 09/03/2012: Final Conclusions:  1. Single-vessel coronary artery disease with patency of the LAD stent and significant stenosis of a moderate caliber diagonal branch.  2. Minor nonobstructive disease of the left circumflex and right coronary arteries  3. Normal left ventricular function.  Recommendations: Considering the patient's mild anginal symptoms, preserved LV function, and thrombocytopenia, I think it is best to manage him medically. He has significant branch vessel stenosis of the diagonal but his major coronary arteries are patent. We could consider percutaneous treatment if his symptoms progress, I would favor medical  therapy.  Outpatient Encounter Prescriptions as of 08/19/2014  Medication Sig  . aspirin 81 MG EC tablet Take 81 mg by mouth at bedtime.   . carvedilol (COREG) 3.125 MG tablet TAKE ONE TABLET BY MOUTH TWICE DAILY WITH MEALS  . simvastatin (ZOCOR) 20 MG tablet TAKE ONE TABLET BY MOUTH AT BEDTIME  . tadalafil (CIALIS) 10 MG tablet Take 1 tablet (10 mg total) by mouth as needed for erectile dysfunction.  . [DISCONTINUED] cefdinir (OMNICEF) 300 MG capsule Take 1 capsule (300 mg total) by mouth 2 (two) times daily.  . [DISCONTINUED] mometasone (ASMANEX 30 METERED DOSES) 220 MCG/INH inhaler Inhale 1 puff into the lungs daily.  . [DISCONTINUED] tiZANidine (ZANAFLEX) 4 MG tablet Take 1 tablet (4 mg total) by mouth every 6 (six) hours as needed for muscle spasms.  . [DISCONTINUED] trimethoprim-polymyxin b (POLYTRIM) ophthalmic solution Place 1 drop into both eyes every 4 (four) hours.    No Known Allergies  Past Medical History  Diagnosis Date  . Coronary atherosclerosis of native coronary artery   . Hyperlipidemia     with low HDL  . Ankylosing spondylitis   . Hypertension   . Allergic rhinitis   . Cough   . Abnormal chest x-ray   . Colonic polyp   . Hemorrhoids   . Rotator cuff tear   . Ganglion cyst of wrist     left  . Other disorder of muscle, ligament, and fascia   . Anxiety   . History of malaria   . Arthritis   . Kidney stone   . Heart attack 2008    family history includes Cancer in his mother; Diabetes in his mother; Emphysema in his mother; Rheum arthritis in his mother.   ROS: Negative except as per HPI  BP 136/78  mmHg  Pulse 48  Ht 5\' 7"  (1.702 m)  Wt 187 lb 3.2 oz (84.913 kg)  BMI 29.31 kg/m2  PHYSICAL EXAM: Pt is alert and oriented, NAD HEENT: normal Neck: JVP - normal, carotids 2+= without bruits Lungs: CTA bilaterally CV: RRR without murmur or gallop Abd: soft, NT, Positive BS, no hepatomegaly Ext: no C/C/E, distal pulses intact and equal Skin: warm/dry  no rash  EKG:  Marked sinus bradycardia 48 bpm, possible age-indeterminate septal infarct.  ASSESSMENT AND PLAN: 1. Coronary artery disease, native vessel. The patient is stable without symptoms of angina. I am concerned that his fatigue may be related to bradycardia. I have advised him that we will wean him off of carvedilol. He will take 3.125 mg daily for one week then stop. Otherwise will continue on aspirin and a statin drug.  2. Essential hypertension. Blood pressure is controlled. He reports normal readings are in the 110 to 120s range. I advised him to buy a home blood pressure cuff as he is weaning off of carvedilol.  3. Hyperlipidemia. Continues on simvastatin. Lipid Panel     Component Value Date/Time   CHOL 119 10/26/2013 1039   TRIG 56.0 10/26/2013 1039   HDL 39.00* 10/26/2013 1039   CHOLHDL 3 10/26/2013 1039   VLDL 11.2 10/26/2013 1039   LDLCALC 69 10/26/2013 1039   For follow-up I will see him back in 4 months to see how he is doing off of carvedilol. If okay will resume annual follow-up.  [name], MD 08/19/2014 12:02 PM  ADDENDUM 09/27/2014: This is documentation of a telephone call to the patient. He brought in home blood pressure readings. On average about 37% of his systolic blood pressures are between 140-150 mmHg. The rest of his systolic blood pressures are in range. His diastolic blood pressures are normal. He reports continued fatigue, despite the fact that we stop carvedilol at the time of his last visit. The patient is concerned about an anginal equivalent. I reviewed his last exercise treadmill study which was abnormal. His last heart catheterization demonstrated obstructive disease in the diagonal branch. This was approximately 2 years ago. I recommended an exercise Myoview scan for risk stratification and to potentially localize ischemia.  [name] 09/27/2014 3:36 PM

## 2014-08-19 NOTE — Patient Instructions (Signed)
Your physician has recommended you make the following change in your medication:  1) Decrease coreg (carvediolol) to 3.125 mg once daily x 1 week, then STOP altogether.  Your physician has requested that you regularly monitor and record your blood pressure readings at home. Please use the same machine at the same time of day to check your readings and record them to bring to your follow-up visit.- please call and let Dr. Burt Knack know if you are consistently starting to get blood pressure readings of 140/90 or greater.  Your physician recommends that you schedule a follow-up appointment in: 4 months with Dr. Burt Knack.

## 2014-08-29 ENCOUNTER — Encounter (HOSPITAL_COMMUNITY): Payer: Self-pay | Admitting: Cardiovascular Disease

## 2014-08-31 ENCOUNTER — Telehealth: Payer: Self-pay | Admitting: *Deleted

## 2014-08-31 DIAGNOSIS — I10 Essential (primary) hypertension: Secondary | ICD-10-CM

## 2014-08-31 MED ORDER — LOSARTAN POTASSIUM 50 MG PO TABS: 50.0000 mg | ORAL_TABLET | Freq: Every day | ORAL | Status: DC

## 2014-08-31 NOTE — Telephone Encounter (Signed)
Patient called and stated that his bp has been steadily rising. He would like to re-start the carvedilol. Please advise. Thanks, MI

## 2014-08-31 NOTE — Telephone Encounter (Signed)
I spoke with the pt and since he discontinued Carvedilol his BP has increased.  The pt has been tracking his readings and plans to mail his Excel spreadsheet to the office.    Last 4 days BP readings:  1/17 141/78, 54 1/18 144/84, 69 1/19 158/87, 54 1/20 163/86, 53

## 2014-08-31 NOTE — Telephone Encounter (Signed)
Discussed pt with Dr Acie Fredrickson (DOD) and he would like the pt to start Losartan 50mg  once a day and check a BMP in 3 WEEKS. Pt aware of medication change and Rx sent to the pharmacy. Pt scheduled for BMP on 09/21/14.

## 2014-09-06 ENCOUNTER — Encounter: Payer: Self-pay | Admitting: Cardiology

## 2014-09-06 ENCOUNTER — Other Ambulatory Visit: Payer: Self-pay | Admitting: Cardiology

## 2014-09-21 ENCOUNTER — Other Ambulatory Visit (INDEPENDENT_AMBULATORY_CARE_PROVIDER_SITE_OTHER): Payer: Medicare Other | Admitting: *Deleted

## 2014-09-21 ENCOUNTER — Telehealth: Payer: Self-pay | Admitting: Cardiovascular Disease

## 2014-09-21 DIAGNOSIS — I10 Essential (primary) hypertension: Secondary | ICD-10-CM | POA: Diagnosis not present

## 2014-09-21 DIAGNOSIS — R5383 Other fatigue: Secondary | ICD-10-CM

## 2014-09-21 LAB — BASIC METABOLIC PANEL
BUN: 19 mg/dL (ref 6–23)
CHLORIDE: 105 meq/L (ref 96–112)
CO2: 29 mEq/L (ref 19–32)
Calcium: 9.5 mg/dL (ref 8.4–10.5)
Creatinine, Ser: 0.88 mg/dL (ref 0.40–1.50)
GFR: 90.66 mL/min (ref >60.00)
GLUCOSE: 116 mg/dL — AB (ref 70–99)
Potassium: 4.2 mEq/L (ref 3.5–5.1)
Sodium: 140 mEq/L (ref 135–145)

## 2014-09-21 NOTE — Telephone Encounter (Signed)
The patient came to the office today for lab work to be done. He was taken off coreg and started on losartan 50 mg on 08/31/14. Coreg was discontinued due to low HR and elevated BP. The patient asked to speak with a nurse today regarding his medication and BP readings. Per his report, he has mailed in some more BP readings to Dr. Burt Knack on 09/19/14. I do not see these in his box yet. Per the patient's report, his BP has been 135-155/ 70-80 on average, but systolic readings have been more toward the 150 range. He has had some improvement in his HR with readings around 60 bpm. He is concerned that he has had sleepiness in the afternoons where he will nap if he sits down. He also feels this way if he sits in front of the computer, but it is also harder for him to focus. He was started on coreg in 2008 after his MI. He feels he may not be quite as sleepy in the afternoon since losartan was started. He is concerned that his BP readings are higher than what they should be. I asked at what time he takes his readings and per his report he will take these sometimes prior to meds and sometimes "shortly" after his medications. He states he will then wait another 10-15 minutes and check it again and his readings will be about 10 points lower. I have advised the patient to take his AM meds and then wait about 30 minutes to 1 hour after and then take his readings. I did not ask him if he has any trouble with snoring or been diagnosed with sleep apnea- to maybe explain some of his afternoon sleepiness. I advised him I would forward this information to Dr. Burt Knack and Ander Purpura. We will follow up with him after his BMP results are back and we see the BP readings his has mailed in. He is agreeable.

## 2014-09-26 ENCOUNTER — Telehealth: Payer: Self-pay | Admitting: *Deleted

## 2014-09-26 NOTE — Telephone Encounter (Signed)
pt notified of lab results with verbal understanding. Pt asked me if Dr. Burt Knack was going to change his losartan as per his cal earlier last week. I stated both Lauren and Dr. Burt Knack are not in the office today, however they will see his message and cb with any changes if so to be made with medication. Pt said ok and thank you.

## 2014-09-27 NOTE — Addendum Note (Signed)
Addended by: Barkley Boards on: 09/27/2014 03:50 PM   Modules accepted: Orders

## 2014-09-27 NOTE — Telephone Encounter (Signed)
ADDENDUM 09/27/2014: This is documentation of a telephone call to the patient. He brought in home blood pressure readings. On average about 03% of his systolic blood pressures are between 140-150 mmHg. The rest of his systolic blood pressures are in range. His diastolic blood pressures are normal. He reports continued fatigue, despite the fact that we stop carvedilol at the time of his last visit. The patient is concerned about an anginal equivalent. I reviewed his last exercise treadmill study which was abnormal. His last heart catheterization demonstrated obstructive disease in the diagonal branch. This was approximately 2 years ago. I recommended an exercise Myoview scan for risk stratification and to potentially localize ischemia.  [name] 09/27/2014 3:36 PM

## 2014-09-28 NOTE — Telephone Encounter (Signed)
Pt scheduled for myoview 10/06/14.

## 2014-10-03 ENCOUNTER — Encounter: Payer: Self-pay | Admitting: Cardiology

## 2014-10-06 ENCOUNTER — Ambulatory Visit (HOSPITAL_COMMUNITY): Payer: Medicare Other | Attending: Cardiology | Admitting: Radiology

## 2014-10-06 DIAGNOSIS — R0600 Dyspnea, unspecified: Secondary | ICD-10-CM | POA: Diagnosis not present

## 2014-10-06 DIAGNOSIS — R5383 Other fatigue: Secondary | ICD-10-CM | POA: Insufficient documentation

## 2014-10-06 DIAGNOSIS — I2510 Atherosclerotic heart disease of native coronary artery without angina pectoris: Secondary | ICD-10-CM | POA: Insufficient documentation

## 2014-10-06 MED ORDER — TECHNETIUM TC 99M SESTAMIBI GENERIC - CARDIOLITE
10.0000 | Freq: Once | INTRAVENOUS | Status: AC | PRN
  Administered 2014-10-06: 10 via INTRAVENOUS

## 2014-10-06 MED ORDER — TECHNETIUM TC 99M SESTAMIBI GENERIC - CARDIOLITE
30.0000 | Freq: Once | INTRAVENOUS | Status: AC | PRN
  Administered 2014-10-06: 30 via INTRAVENOUS

## 2014-10-06 NOTE — Progress Notes (Signed)
[facility] [address] [place], [place] [address] [phone]    Cardiology Nuclear Med Study  DOMONIQUE BROUILLARD is a 74 y.o. male     MRN : [id number]     DOB: 1941-01-06  Procedure Date: 10/06/2014  Nuclear Med Background Indication for Stress Test:  Evaluation for Ischemia and Stent Patency History:  CAD, MPI 2009 (normal) EF 56% Cardiac Risk Factors: Hypertension and Lipids  Symptoms:  DOE and Fatigue   Nuclear Pre-Procedure Caffeine/Decaff Intake:  None> 12 hrs NPO After: 6:00pm   Lungs:  clear O2 Sat: 94% on room air. IV 0.9% NS with Angio Cath:  22g  IV Site: R Hand x 1, tolerated well IV Started by:  Irven Baltimore, RN  Chest Size (in):  40 Cup Size: n/a  Height: 5\' 7"  (1.702 m)  Weight:  182 lb (82.555 kg)  BMI:  Body mass index is 28.5 kg/(m^2). Tech Comments:  No medication this am. Patient held Losartan x 24 hrs. Irven Baltimore, RN.    Nuclear Med Study 1 or 2 day study: 1 day  Stress Test Type:  Stress  Reading MD: N/A  Order Authorizing Provider:  [name], MD  Resting Radionuclide: Technetium 67m Sestamibi  Resting Radionuclide Dose: 11.0 mCi   Stress Radionuclide:  Technetium 63m Sestamibi  Stress Radionuclide Dose: 33.0 mCi           Stress Protocol Rest HR: 57 Stress HR: 141  Rest BP: 133/84 Stress BP: 192/70  Exercise Time (min): 7:00 METS: 8.5   Predicted Max HR: 149 bpm % Max HR: 94.63 bpm Rate Pressure Product: 27072   Dose of Adenosine (mg):  n/a Dose of Lexiscan: n/a mg  Dose of Atropine (mg): n/a Dose of Dobutamine: n/a mcg/kg/min (at max HR)  Stress Test Technologist: Glade Lloyd, BS-ES  Nuclear Technologist:  Earl Many, CNMT     Rest Procedure:  Myocardial perfusion imaging was performed at rest 45 minutes following the intravenous administration of Technetium 46m Sestamibi. Rest ECG: NSR with non-specific ST-T wave changes  Stress Procedure:  The patient exercised on the treadmill utilizing  the Bruce Protocol for 7:00 minutes. The patient stopped due to SOB, leg fatigue and denied any chest pain.  Technetium 43m Sestamibi was injected at peak exercise and myocardial perfusion imaging was performed after a brief delay. Stress ECG: Downsloping ST segment depression diffusely, 2 mm with elevation in aVR suggestive of ischemia. ST segment changes were prolonged lasting up to 7 minutes into recovery.  QPS Raw Data Images:  Normal; no motion artifact; normal heart/lung ratio. Stress Images:  Mild focal inferior wall decreased uptake seen at both rest and stress. No ischemia identified. Otherwise, homogeneous radiotracer uptake. Rest Images:  As above. Subtraction (SDS):  No evidence of ischemia. Transient Ischemic Dilatation (Normal <1.22):  0.90 Lung/Heart Ratio (Normal <0.45):  0.36  Quantitative Gated Spect Images QGS EDV:  88 ml QGS ESV:  42 ml  Impression Exercise Capacity:  Fair exercise capacity. 7 minutes BP Response:  Normal blood pressure response. 093 up to 235 systolic. Clinical Symptoms:  There is dyspnea. leg fatigue  ECG Impression:  Significant ST abnormalities consistent with ischemia. Comparison with Prior Nuclear Study: No images to compare  Overall Impression:  Low risk stress nuclear study with no perfusion defects suggestive of ischemia. There was no transient ischemic dilatation. Blood pressure response was normal to exercise.. EKG findings on treadmill however were markedly abnormal however in review of  prior nuclear stress test in 2004, similar EKG findings were noted. In 2008, he suffered V. fib arrest and was found to have a proximal occluded LAD which was successfully opened by Dr. Olevia Perches. There is no evidence of anterior wall or anteroseptal wall ischemia.   LV Ejection Fraction: 53%.  LV Wall Motion:  Mild hypokinesis of distal septal wall.  Candee Furbish, MD

## 2014-10-16 ENCOUNTER — Other Ambulatory Visit: Payer: Self-pay | Admitting: Cardiovascular Disease

## 2014-10-17 DIAGNOSIS — H1131 Conjunctival hemorrhage, right eye: Secondary | ICD-10-CM | POA: Diagnosis not present

## 2014-10-19 ENCOUNTER — Other Ambulatory Visit (HOSPITAL_BASED_OUTPATIENT_CLINIC_OR_DEPARTMENT_OTHER): Payer: Medicare Other

## 2014-10-19 ENCOUNTER — Telehealth: Payer: Self-pay | Admitting: Internal Medicine

## 2014-10-19 ENCOUNTER — Ambulatory Visit (HOSPITAL_BASED_OUTPATIENT_CLINIC_OR_DEPARTMENT_OTHER): Payer: Medicare Other | Admitting: Internal Medicine

## 2014-10-19 ENCOUNTER — Encounter: Payer: Self-pay | Admitting: Internal Medicine

## 2014-10-19 VITALS — BP 143/64 | HR 57 | Temp 98.5°F | Resp 18 | Ht 67.0 in | Wt 187.3 lb

## 2014-10-19 DIAGNOSIS — D696 Thrombocytopenia, unspecified: Secondary | ICD-10-CM

## 2014-10-19 LAB — COMPREHENSIVE METABOLIC PANEL (CC13)
ALBUMIN: 3.9 g/dL (ref 3.5–5.0)
ALK PHOS: 58 U/L (ref 40–150)
ALT: 19 U/L (ref 0–55)
AST: 17 U/L (ref 5–34)
Anion Gap: 10 mEq/L (ref 3–11)
BILIRUBIN TOTAL: 0.47 mg/dL (ref 0.20–1.20)
BUN: 22.7 mg/dL (ref 7.0–26.0)
CALCIUM: 9.3 mg/dL (ref 8.4–10.4)
CHLORIDE: 109 meq/L (ref 98–109)
CO2: 24 mEq/L (ref 22–29)
Creatinine: 0.8 mg/dL (ref 0.7–1.3)
EGFR: 89 mL/min/{1.73_m2} — ABNORMAL LOW (ref >90)
Glucose: 112 mg/dl (ref 70–140)
Potassium: 4.2 mEq/L (ref 3.5–5.1)
SODIUM: 143 meq/L (ref 136–145)
Total Protein: 6.7 g/dL (ref 6.4–8.3)

## 2014-10-19 LAB — CBC WITH DIFFERENTIAL/PLATELET
BASO%: 0 % (ref 0.0–2.0)
Basophils Absolute: 0 10*3/uL (ref 0.0–0.1)
EOS%: 0.2 % (ref 0.0–7.0)
Eosinophils Absolute: 0 10*3/uL (ref 0.0–0.5)
HCT: 43.5 % (ref 38.4–49.9)
HEMOGLOBIN: 14.5 g/dL (ref 13.0–17.1)
LYMPH%: 28.4 % (ref 14.0–49.0)
MCH: 30.3 pg (ref 27.2–33.4)
MCHC: 33.3 g/dL (ref 32.0–36.0)
MCV: 91 fL (ref 79.3–98.0)
MONO#: 0.8 10*3/uL (ref 0.1–0.9)
MONO%: 16.5 % — AB (ref 0.0–14.0)
NEUT%: 54.9 % (ref 39.0–75.0)
NEUTROS ABS: 2.5 10*3/uL (ref 1.5–6.5)
Platelets: 75 10*3/uL — ABNORMAL LOW (ref 140–400)
RBC: 4.78 10*6/uL (ref 4.20–5.82)
RDW: 15.1 % — AB (ref 11.0–14.6)
WBC: 4.5 10*3/uL (ref 4.0–10.3)
lymph#: 1.3 10*3/uL (ref 0.9–3.3)
nRBC: 0 % (ref 0–0)

## 2014-10-19 LAB — LACTATE DEHYDROGENASE (CC13): LDH: 104 U/L — AB (ref 125–245)

## 2014-10-19 NOTE — Telephone Encounter (Signed)
gv and printed appt schedand avs for pt for Sept °

## 2014-10-19 NOTE — Progress Notes (Signed)
[facility] Telephone:([phone]   Fax:([phone]  OFFICE PROGRESS NOTE  Garnet Koyanagi, DO [place] [address]  DIAGNOSIS: Thrombocytopenia, most likely idiopathic thrombocytopenic purpura.  PRIOR THERAPY:None.  CURRENT THERAPY: observation.  INTERVAL HISTORY: [name] 74 y.o. male returns to the clinic today for routine six-month followup visit. The patient is feeling fine today with no specific complaints. He had a small area of subconjunctival hemorrhage in the right eye started 2 days ago. He was seen by his ophthalmologist and currently on observation. He denied having any significant bleeding issues, bruises or ecchymosis. The patient has no chest pain, shortness of breath, cough or hemoptysis. He denied having any significant weight loss or night sweats. He has repeat CBC performed earlier today and is here for evaluation and discussion of his lab results.  MEDICAL HISTORY: Past Medical History  Diagnosis Date  . Coronary atherosclerosis of native coronary artery   . Hyperlipidemia     with low HDL  . Ankylosing spondylitis   . Hypertension   . Allergic rhinitis   . Cough   . Abnormal chest x-ray   . Colonic polyp   . Hemorrhoids   . Rotator cuff tear   . Ganglion cyst of wrist     left  . Other disorder of muscle, ligament, and fascia   . Anxiety   . History of malaria   . Arthritis   . Kidney stone   . Heart attack 2008    ALLERGIES:  has No Known Allergies.  MEDICATIONS:  Current Outpatient Prescriptions  Medication Sig Dispense Refill  . aspirin 81 MG EC tablet Take 81 mg by mouth at bedtime.     [name] losartan (COZAAR) 50 MG tablet Take 1 tablet (50 mg total) by mouth daily. 30 tablet 3  . simvastatin (ZOCOR) 20 MG tablet TAKE ONE TABLET BY MOUTH ONCE DAILY AT BEDTIME 30 tablet 6   No current facility-administered medications for this visit.    SURGICAL HISTORY:  Past Surgical History    Procedure Laterality Date  . Rotator cuff repair      bilat by Dr. French Ana  . Hemorrhoid surgery  10/10    in [place]  . Left heart catheterization with coronary angiogram N/A 09/03/2012    Procedure: LEFT HEART CATHETERIZATION WITH CORONARY ANGIOGRAM;  Surgeon: [name], MD;  Location: [facility] CATH LAB;  Service: Cardiovascular;  Laterality: N/A;    REVIEW OF SYSTEMS:  A comprehensive review of systems was negative.   PHYSICAL EXAMINATION: General appearance: alert, cooperative and no distress Head: Normocephalic, without obvious abnormality, atraumatic Neck: no adenopathy Lymph nodes: Cervical, supraclavicular, and axillary nodes normal. Resp: clear to auscultation bilaterally Cardio: regular rate and rhythm, S1, S2 normal, no murmur, click, rub or gallop GI: soft, non-tender; bowel sounds normal; no masses,  no organomegaly Extremities: extremities normal, atraumatic, no cyanosis or edema  ECOG PERFORMANCE STATUS: 0 - Asymptomatic  There were no vitals taken for this visit.  LABORATORY DATA: Lab Results  Component Value Date   WBC 4.5 10/19/2014   HGB 14.5 10/19/2014   HCT 43.5 10/19/2014   MCV 91.0 10/19/2014   PLT 75* 10/19/2014      Chemistry      Component Value Date/Time   NA 140 09/21/2014 0839   NA 142 10/25/2013 1146   K 4.2 09/21/2014 0839   K 4.2 10/25/2013 1146   CL 105 09/21/2014 0839  CL 106 09/30/2012 0949   CO2 29 09/21/2014 0839   CO2 23 10/25/2013 1146   BUN 19 09/21/2014 0839   BUN 21.6 10/25/2013 1146   CREATININE 0.88 09/21/2014 0839   CREATININE 0.9 10/25/2013 1146      Component Value Date/Time   CALCIUM 9.5 09/21/2014 0839   CALCIUM 9.8 10/25/2013 1146   ALKPHOS 63 10/25/2013 1146   ALKPHOS 53 08/03/2012 1019   AST 22 10/25/2013 1146   AST 42* 08/03/2012 1019   ALT 30 10/25/2013 1146   ALT 62* 08/03/2012 1019   BILITOT 0.70 10/25/2013 1146   BILITOT 1.1 08/03/2012 1019     BONE MARROW REPORT FINAL DIAGNOSIS RADIOGRAPHIC  STUDIES: No results found.  ASSESSMENT AND PLAN: This is a very pleasant 74 years old white male with thrombocytopenia most likely idiopathic thrombocytopenic purpura.  His platelets count are low at 75,000 and the patient is currently asymptomatic.   I recommended for him to continue on observation for now as he is currently asymptomatic. I would see him back for followup visit in 6 months with repeat CBC and LDH. I advised the patient to call me immediately if he has any significant bleeding issues, bruises or ecchymosis or if his platelets count to be less than 50,000.  All questions were answered. The patient knows to call the clinic with any problems, questions or concerns. We can certainly see the patient much sooner if necessary.  Disclaimer: This note was dictated with voice recognition software. Similar sounding words can inadvertently be transcribed and may not be corrected upon review.

## 2014-11-19 ENCOUNTER — Encounter (HOSPITAL_COMMUNITY): Payer: Self-pay | Admitting: Emergency Medicine

## 2014-11-19 ENCOUNTER — Emergency Department (INDEPENDENT_AMBULATORY_CARE_PROVIDER_SITE_OTHER)
Admission: EM | Admit: 2014-11-19 | Discharge: 2014-11-19 | Disposition: A | Discharge status: Home / Self Care | Payer: Medicare Other | Source: Home / Self Care | Attending: Family Medicine | Admitting: Family Medicine

## 2014-11-19 DIAGNOSIS — K130 Diseases of lips: Secondary | ICD-10-CM

## 2014-11-19 MED ORDER — AMOXICILLIN-POT CLAVULANATE 875-125 MG PO TABS: 1.0000 | ORAL_TABLET | Freq: Two times a day (BID) | ORAL | Status: DC

## 2014-11-19 NOTE — ED Provider Notes (Signed)
CSN: [id number]     Arrival date & time 11/19/14  1719 History   First MD Initiated Contact with Patient 11/19/14 1836     Chief Complaint  Patient presents with  . Oral Swelling   (Consider location/radiation/quality/duration/timing/severity/associated sxs/prior Treatment) HPI Comments: 74 year old male states he felt a nodule in the upper lip yesterday. He felt as though it was a sizable BB. Today he says it has increased in size and feels like is the size of a pea and is now tender.   Past Medical History  Diagnosis Date  . Coronary atherosclerosis of native coronary artery   . Hyperlipidemia     with low HDL  . Ankylosing spondylitis   . Hypertension   . Allergic rhinitis   . Cough   . Abnormal chest x-ray   . Colonic polyp   . Hemorrhoids   . Rotator cuff tear   . Ganglion cyst of wrist     left  . Other disorder of muscle, ligament, and fascia   . Anxiety   . History of malaria   . Arthritis   . Kidney stone   . Heart attack 2008   Past Surgical History  Procedure Laterality Date  . Rotator cuff repair      bilat by Dr. French Ana  . Hemorrhoid surgery  10/10    in [place]  . Left heart catheterization with coronary angiogram N/A 09/03/2012    Procedure: LEFT HEART CATHETERIZATION WITH CORONARY ANGIOGRAM;  Surgeon: [name], MD;  Location: [facility] CATH LAB;  Service: Cardiovascular;  Laterality: N/A;   Family History  Problem Relation Age of Onset  . Cancer Mother     melanoma  . Emphysema Mother   . Rheum arthritis Mother   . Diabetes Mother    History  Substance Use Topics  . Smoking status: Never Smoker   . Smokeless tobacco: Never Used  . Alcohol Use: No     Comment: occ 1 beer, wine occ    Review of Systems  Constitutional: Negative for fever, activity change and fatigue.  HENT:       As per history of present illness. Patient has no complaints for other intraoral lesions or discomfort.  Skin: Negative.   All other systems reviewed and are  negative.   Allergies  Review of patient's allergies indicates no known allergies.  Home Medications   Prior to Admission medications   Medication Sig Start Date End Date Taking? Authorizing Provider  aspirin 81 MG EC tablet Take 81 mg by mouth at bedtime.    Yes Historical Provider, MD  losartan (COZAAR) 50 MG tablet Take 1 tablet (50 mg total) by mouth daily. 08/31/14  Yes [name], MD  simvastatin (ZOCOR) 20 MG tablet TAKE ONE TABLET BY MOUTH ONCE DAILY AT BEDTIME 10/17/14  Yes [name], MD  amoxicillin-clavulanate (AUGMENTIN) 875-125 MG per tablet Take 1 tablet by mouth every 12 (twelve) hours. 11/19/14   Janne Napoleon, NP   BP 112/66 mmHg  Pulse 58  Temp(Src) 98.8 F (37.1 C) (Oral)  Resp 16  SpO2 96% Physical Exam  Constitutional: He is oriented to person, place, and time. He appears well-developed and well-nourished. No distress.  HENT:  Mouth/Throat: Oropharynx is clear and moist. No oropharyngeal exudate.  When the upper lip is everted there is a palpable and tender nodule within the mucosal aspect. There is minor overlying erythema of the mucosa.. No pustule or similar-type structure is visible. this lesion is not visible to the external  surface. His I am unable to palpated and there is no discoloration.   Eyes: Conjunctivae and EOM are normal.  Neurological: He is alert and oriented to person, place, and time.  Skin: Skin is warm and dry.  Nursing note and vitals reviewed.   ED Course  Procedures (including critical care time) Labs Review Labs Reviewed - No data to display  Imaging Review No results found.   MDM   1. Mucocele of lip    Augmentin. Heat F/u with dentist    Janne Napoleon, NP 11/19/14 1901

## 2014-11-19 NOTE — ED Notes (Signed)
Reports having a pea sized lump inside of the upper lip.  Noticed yesterday.  States over night it has grown twice in size.  Tender to touch.

## 2014-12-14 ENCOUNTER — Ambulatory Visit (INDEPENDENT_AMBULATORY_CARE_PROVIDER_SITE_OTHER): Payer: Medicare Other | Admitting: Physician Assistant

## 2014-12-14 VITALS — BP 120/70 | HR 88 | Temp 98.0°F | Resp 16 | Ht 67.0 in | Wt 191.0 lb

## 2014-12-14 DIAGNOSIS — R05 Cough: Secondary | ICD-10-CM

## 2014-12-14 DIAGNOSIS — I2510 Atherosclerotic heart disease of native coronary artery without angina pectoris: Secondary | ICD-10-CM

## 2014-12-14 DIAGNOSIS — R059 Cough, unspecified: Secondary | ICD-10-CM

## 2014-12-14 MED ORDER — HYDROCOD POLST-CPM POLST ER 10-8 MG/5ML PO SUER: 5.0000 mL | Freq: Two times a day (BID) | ORAL | Status: DC | PRN

## 2014-12-14 NOTE — Patient Instructions (Signed)
Use Claritin, Allegra or Zyrtec to improve your allergy symptoms. If that doesn't work, you may need additional treatment.

## 2014-12-14 NOTE — Progress Notes (Signed)
Patient ID: [name], male    DOB: 04-26-1941, 74 y.o.   MRN: [id number]  PCP: Garnet Koyanagi, DO  Subjective:   Chief Complaint  Patient presents with  . chest congestion    Happened over the weekend  . Cough    HPI Presents for evaluation of cough and chest congestion x 3 days. He spent several days with his grandson recently, who had similar symptoms. Cough is occasionally productive of clear sputum. Kept him awake last night. Tired. No significant nasal/sinus symptoms. No fever/chills. No GI/GU symptoms.  His concern is that he seems to have never completely resolved this issue from a visit 04/07/2014.  He was treated with phenergan with codeine, Mucinex DM and Augmentin for sinusitis and bronchitis.  He reports that his symptoms resolved other than the cough. He was seen again 05/31/2014 reporting cough and painful globus sensation. CXR was normal at that time. He started Qvar and and referred to ENT. His symptoms had resolved, save a small clavicular node, when he saw ENT on 06/08/2015.  He stopped the Qvar, feeling that it was ineffective.    Review of Systems  Constitutional: Positive for fatigue. Negative for fever, chills, diaphoresis, activity change, appetite change and unexpected weight change.  HENT: Negative for congestion, dental problem, ear pain, postnasal drip, rhinorrhea, sinus pressure, sneezing, sore throat, trouble swallowing and voice change.   Eyes: Negative for pain and visual disturbance.  Respiratory: Positive for cough. Negative for choking, chest tightness, shortness of breath, wheezing and stridor.   Cardiovascular: Negative for chest pain and palpitations.  Gastrointestinal: Negative for nausea, vomiting, abdominal pain, diarrhea, constipation and abdominal distention.  Skin: Negative for rash.  Allergic/Immunologic: Positive for environmental allergies.  Neurological: Negative for dizziness, weakness and headaches.       Patient Active  Problem List   Diagnosis Date Noted  . Benign mole 02/04/2014  . Impaired glucose tolerance 10/26/2013  . NAFLD (nonalcoholic fatty liver disease) 04/27/2013  . Thrombocytopenia 10/21/2012  . Exertional angina 09/03/2012  . MALARIA 12/05/2009  . COLONIC POLYPS 12/05/2009  . HYPERCHOLESTEROLEMIA 12/05/2009  . ANXIETY 12/05/2009  . HEMORRHOIDS 12/05/2009  . ALLERGIC RHINITIS 12/05/2009  . ANKYLOSING SPONDYLITIS 12/05/2009  . GANGLION CYST, WRIST, LEFT 12/05/2009  . ROTATOR CUFF TEAR 12/05/2009  . OTHER DISORDER OF MUSCLE LIGAMENT AND FASCIA 12/05/2009  . ABNORMAL CHEST XRAY 12/05/2009  . CAD, NATIVE VESSEL 04/05/2009     Prior to Admission medications   Medication Sig Start Date End Date Taking? Authorizing Provider  aspirin 81 MG EC tablet Take 81 mg by mouth at bedtime.    Yes Historical Provider, MD  losartan (COZAAR) 50 MG tablet Take 1 tablet (50 mg total) by mouth daily. 08/31/14  Yes [name], MD  simvastatin (ZOCOR) 20 MG tablet TAKE ONE TABLET BY MOUTH ONCE DAILY AT BEDTIME 10/17/14  Yes [name], MD     No Known Allergies     Objective:  Physical Exam  Constitutional: He is oriented to person, place, and time. Vital signs are normal. He appears well-developed and well-nourished. He is active and cooperative. No distress.  BP 120/70 mmHg  Pulse 88  Temp(Src) 98 F (36.7 C) (Oral)  Resp 16  Ht 5\' 7"  (1.702 m)  Wt 191 lb (86.637 kg)  BMI 29.91 kg/m2  SpO2 93%  HENT:  Head: Normocephalic and atraumatic.  Right Ear: Hearing, tympanic membrane, external ear and ear canal normal.  Left Ear: Hearing, tympanic membrane, external ear and ear  canal normal.  Nose: Mucosal edema (mild) present. No rhinorrhea, nose lacerations, sinus tenderness, nasal deformity, septal deviation or nasal septal hematoma. No epistaxis.  No foreign bodies. Right sinus exhibits no maxillary sinus tenderness and no frontal sinus tenderness. Left sinus exhibits no maxillary sinus  tenderness and no frontal sinus tenderness.  Mouth/Throat: Uvula is midline, oropharynx is clear and moist and mucous membranes are normal. No oral lesions. Normal dentition.  Eyes: Conjunctivae are normal. No scleral icterus.  Neck: Normal range of motion, full passive range of motion without pain and phonation normal. Neck supple. No thyromegaly present.  Cardiovascular: Normal rate, regular rhythm and normal heart sounds.   Pulses:      Radial pulses are 2+ on the right side, and 2+ on the left side.  Pulmonary/Chest: Effort normal and breath sounds normal.  Lymphadenopathy:       Head (right side): No tonsillar, no preauricular, no posterior auricular and no occipital adenopathy present.       Head (left side): No tonsillar, no preauricular, no posterior auricular and no occipital adenopathy present.    He has no cervical adenopathy.       Right: No supraclavicular adenopathy present.       Left: No supraclavicular adenopathy present.  Neurological: He is alert and oriented to person, place, and time. No sensory deficit.  Skin: Skin is warm, dry and intact. No rash noted. No cyanosis or erythema. Nails show no clubbing.  Psychiatric: He has a normal mood and affect. His speech is normal and behavior is normal.           Assessment & Plan:   1. Cough We discussed allergic, reactive airway and reflux causes of chronic cough.  Doubt bacterial infection at this point. Suspect viral respiratory infection vs. Allergic. OTC oral antihistamine. Tussionex. If symptoms persist, consider steroid nasal spray. RTC or follow-up with PCP. - chlorpheniramine-HYDROcodone (TUSSIONEX PENNKINETIC ER) 10-8 MG/5ML SUER; Take 5 mLs by mouth every 12 (twelve) hours as needed for cough.  Dispense: 100 mL; Refill: 0   Fara Chute, PA-C Physician Assistant-Certified [facility] .

## 2014-12-15 ENCOUNTER — Encounter: Payer: Self-pay | Admitting: Physician Assistant

## 2014-12-17 ENCOUNTER — Telehealth: Payer: Self-pay

## 2014-12-17 NOTE — Telephone Encounter (Signed)
"  If symptoms persist, consider steroid nasal spray. RTC or follow-up with PCP." Advised pt to RTC if he feels like it is in his chest. Pt agreeable

## 2014-12-17 NOTE — Telephone Encounter (Signed)
Pt saw chelle recently and was given zrytec and was told if things got worse to call back -pt feels his chest congestion is getting worse

## 2014-12-18 ENCOUNTER — Ambulatory Visit (INDEPENDENT_AMBULATORY_CARE_PROVIDER_SITE_OTHER): Payer: Medicare Other | Admitting: Family Medicine

## 2014-12-18 ENCOUNTER — Other Ambulatory Visit: Payer: Self-pay | Admitting: Cardiovascular Disease

## 2014-12-18 VITALS — BP 114/62 | HR 80 | Temp 98.2°F | Resp 18 | Ht 67.0 in | Wt 191.0 lb

## 2014-12-18 DIAGNOSIS — J209 Acute bronchitis, unspecified: Secondary | ICD-10-CM | POA: Diagnosis not present

## 2014-12-18 DIAGNOSIS — I2510 Atherosclerotic heart disease of native coronary artery without angina pectoris: Secondary | ICD-10-CM

## 2014-12-18 MED ORDER — AZITHROMYCIN 250 MG PO TABS: ORAL_TABLET | ORAL | Status: DC

## 2014-12-18 MED ORDER — PREDNISONE 20 MG PO TABS: ORAL_TABLET | ORAL | Status: DC

## 2014-12-18 NOTE — Patient Instructions (Signed)

## 2014-12-18 NOTE — Progress Notes (Signed)
° °  Subjective:    Patient ID: [name], male    DOB: Oct 16, 1940, 74 y.o.   MRN: [id number] This chart was scribed for Robyn Haber, MD by Zola Button, Medical Scribe. This patient was seen in Room 10 and the patient's care was started at 8:29 AM.   HPI HPI Comments: [name] is a 74 y.o. male who presents to the [facility] complaining of gradual onset URI symptoms that started last week. Patient reports having chest congestion and dry cough. He also reports having chills yesterday. Patient was seen here 4 days ago by Harrison Mons, PA-C, and was given a prescription for Tussionex. His symptoms had improved overall, but he still has cough and chest congestion.The cough does interrupt his sleep. Patient denies fever and diaphoresis. He also denies hx of asthma.   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  Review of Systems  Constitutional: Positive for chills. Negative for fever.  HENT: Positive for congestion.   Respiratory: Positive for cough.        Objective:   Physical Exam CONSTITUTIONAL: Well developed/well nourished HEAD: Normocephalic/atraumatic EYES: EOM/PERRL ENMT: Mucous membranes moist NECK: supple no meningeal signs SPINE: entire spine nontender CV: S1/S2 noted, no murmurs/rubs/gallops noted LUNGS: Few basilar rales and faint wheezes bilaterally. ABDOMEN: soft, nontender, no rebound or guarding GU: no cva tenderness NEURO: Pt is awake/alert, moves all extremitiesx4 EXTREMITIES: pulses normal, full ROM SKIN: warm, color normal PSYCH: no abnormalities of mood noted    Assessment & Plan:   This chart was scribed in my presence and reviewed by me personally.    ICD-9-CM ICD-10-CM   1. Acute bronchitis, unspecified organism 466.0 J20.9 predniSONE (DELTASONE) 20 MG tablet     azithromycin (ZITHROMAX Z-PAK) 250 MG tablet     Signed, Robyn Haber, MD

## 2014-12-20 ENCOUNTER — Ambulatory Visit (INDEPENDENT_AMBULATORY_CARE_PROVIDER_SITE_OTHER): Payer: Medicare Other | Admitting: Cardiovascular Disease

## 2014-12-20 ENCOUNTER — Encounter: Payer: Self-pay | Admitting: Cardiovascular Disease

## 2014-12-20 VITALS — BP 122/62 | HR 65 | Ht 67.0 in | Wt 191.0 lb

## 2014-12-20 DIAGNOSIS — I2510 Atherosclerotic heart disease of native coronary artery without angina pectoris: Secondary | ICD-10-CM | POA: Diagnosis not present

## 2014-12-20 MED ORDER — LOSARTAN POTASSIUM 50 MG PO TABS: 50.0000 mg | ORAL_TABLET | Freq: Every day | ORAL | Status: DC

## 2014-12-20 NOTE — Progress Notes (Signed)
Cardiology Office Note   Date:  12/21/2014   ID:  BABY STAIRS, DOB Oct 14, 1940, MRN [id number]  PCP:  Garnet Koyanagi, DO  Cardiologist:  [name], MD    No chief complaint on file.    History of Present Illness: [name] is a 74 y.o. male who presents for follow-up evaluation.  The patient initially presented in 2008 with an anterior wall MI complicated by out of hospital ventricular fibrillation arrest. He underwent primary PCI with stenting of the LAD (bare-metal). His left ventricular function had normalized on followup. He had a repeat heart catheterization in January 2014 after presenting with CCS class II angina and an abnormal exercise treadmill study. This demonstrated continued patency of his LAD stent with nonobstructive disease in the right coronary artery and left circumflex. There was moderate stenosis in the diagonal branch and this was thought to be responsible for his anginal symptoms and treadmill abnormality. Medical therapy was recommended.   Other medical problems include hypertension, hyperlipidemia, and ITP.   the patient is doing well. He denies chest pain, chest pressure, or shortness of breath. He's had a recent upper respiratory tract infection and has had a cough. He denies edema, orthopnea, or PND. He's walking about 1 mile per day. He has no exertional symptoms.   Past Medical History  Diagnosis Date  . Coronary atherosclerosis of native coronary artery   . Hyperlipidemia     with low HDL  . Ankylosing spondylitis   . Hypertension   . Allergic rhinitis   . Cough   . Abnormal chest x-ray   . Colonic polyp   . Hemorrhoids   . Rotator cuff tear   . Ganglion cyst of wrist     left  . Other disorder of muscle, ligament, and fascia   . Anxiety   . History of malaria   . Arthritis   . Kidney stone   . Heart attack 2008    Past Surgical History  Procedure Laterality Date  . Rotator cuff repair      bilat by Dr. French Ana  .  Hemorrhoid surgery  10/10    in [place]  . Left heart catheterization with coronary angiogram N/A 09/03/2012    Procedure: LEFT HEART CATHETERIZATION WITH CORONARY ANGIOGRAM;  Surgeon: [name], MD;  Location: [facility];  Service: Cardiovascular;  Laterality: N/A;    Current Outpatient Prescriptions  Medication Sig Dispense Refill  . aspirin 81 MG EC tablet Take 81 mg by mouth at bedtime.     [name] azithromycin (ZITHROMAX Z-PAK) 250 MG tablet 2 today, then one daily 6 each 0  . chlorpheniramine-HYDROcodone (TUSSIONEX PENNKINETIC ER) 10-8 MG/5ML SUER Take 5 mLs by mouth every 12 (twelve) hours as needed for cough. 100 mL 0  . losartan (COZAAR) 50 MG tablet Take 1 tablet (50 mg total) by mouth daily. 30 tablet 11  . predniSONE (DELTASONE) 20 MG tablet Two daily with food 10 tablet 0  . simvastatin (ZOCOR) 20 MG tablet TAKE ONE TABLET BY MOUTH ONCE DAILY AT BEDTIME 30 tablet 6   No current facility-administered medications for this visit.    Allergies:   Review of patient's allergies indicates no known allergies.   Social History:  The patient  reports that he has never smoked. He has never used smokeless tobacco. He reports that he does not drink alcohol or use illicit drugs.   Family History:  The patient's  family history includes Cancer in his mother; Diabetes in his  mother; Emphysema in his mother; Rheum arthritis in his mother.    ROS:  Please see the history of present illness.  All other systems are reviewed and negative.    PHYSICAL EXAM: VS:  BP 122/62 mmHg  Pulse 65  Ht 5\' 7"  (1.702 m)  Wt 191 lb (86.637 kg)  BMI 29.91 kg/m2 , BMI Body mass index is 29.91 kg/(m^2). GEN: Well nourished, well developed, in no acute distress HEENT: normal Neck: no JVD, no masses. No carotid bruits Cardiac: RRR without murmur or gallop                Respiratory:  clear to auscultation bilaterally, normal work of breathing GI: soft, nontender, nondistended, + BS MS: no deformity or  atrophy Ext: no pretibial edema, pedal pulses 2+= bilaterally Skin: warm and dry, no rash Neuro:  Strength and sensation are intact Psych: euthymic mood, full affect  EKG:  EKG is not ordered today.  Recent Labs: 10/19/2014: ALT 19; BUN 22.7; Creatinine 0.8; Hemoglobin 14.5; Platelets 75*; Potassium 4.2; Sodium 143   Lipid Panel     Component Value Date/Time   CHOL 119 10/26/2013 1039   TRIG 56.0 10/26/2013 1039   HDL 39.00* 10/26/2013 1039   CHOLHDL 3 10/26/2013 1039   VLDL 11.2 10/26/2013 1039   LDLCALC 69 10/26/2013 1039      Wt Readings from Last 3 Encounters:  12/20/14 191 lb (86.637 kg)  12/18/14 191 lb (86.637 kg)  12/14/14 191 lb (86.637 kg)     Cardiac Studies Reviewed: Nuclear Stress Test 10/06/2014: Overall Impression: Low risk stress nuclear study with no perfusion defects suggestive of ischemia. There was no transient ischemic dilatation. Blood pressure response was normal to exercise.. EKG findings on treadmill however were markedly abnormal however in review of prior nuclear stress test in 2004, similar EKG findings were noted. In 2008, he suffered V. fib arrest and was found to have a proximal occluded LAD which was successfully opened by Dr. Olevia Perches. There is no evidence of anterior wall or anteroseptal wall ischemia.   LV Ejection Fraction: 53%. LV Wall Motion: Mild hypokinesis of distal septal wall.  ASSESSMENT AND PLAN: 1.   CAD, native vessel: No anginal symptoms. Recent low risk stress nuclear study. Will continue current medical management.   2. Essential hypertension: Blood pressure now well controlled on losartan. Carvedilol was discontinued because of marked bradycardia. His fatigue is a little bit better  Carvedilol.  3. Hyperlipidemia: lipids have been at goal. Last check was 1 year ago. He's had recent LFTs. Was no changes in medicines, I don't think he needs another lipid panel right now.   Current medicines are reviewed with the patient today.   The patient does not have concerns regarding medicines.  Labs/ tests ordered today include:  No orders of the defined types were placed in this encounter.    Disposition:   FU  One year  Signed, [name], MD  12/21/2014 8:47 AM    [facility] [address], [place], [place]  [address] Phone: [phone]; Fax: [phone]

## 2014-12-20 NOTE — Patient Instructions (Signed)
Medication Instructions:  Your physician recommends that you continue on your current medications as directed. Please refer to the Current Medication list given to you today.  Labwork: None  Testing/Procedures: None  Follow-Up: Your physician wants you to follow-up in: 1 YEAR with Dr Cooper.  You will receive a reminder letter in the mail two months in advance. If you don't receive a letter, please call our office to schedule the follow-up appointment.  Any Other Special Instructions Will Be Listed Below (If Applicable).   

## 2014-12-21 ENCOUNTER — Encounter: Payer: Self-pay | Admitting: Cardiovascular Disease

## 2014-12-23 DIAGNOSIS — L570 Actinic keratosis: Secondary | ICD-10-CM | POA: Diagnosis not present

## 2014-12-23 DIAGNOSIS — L820 Inflamed seborrheic keratosis: Secondary | ICD-10-CM | POA: Diagnosis not present

## 2014-12-24 ENCOUNTER — Encounter: Payer: Self-pay | Admitting: Adult Health

## 2014-12-24 ENCOUNTER — Ambulatory Visit (INDEPENDENT_AMBULATORY_CARE_PROVIDER_SITE_OTHER): Payer: Medicare Other

## 2014-12-24 ENCOUNTER — Ambulatory Visit (INDEPENDENT_AMBULATORY_CARE_PROVIDER_SITE_OTHER): Payer: Medicare Other | Admitting: Family Medicine

## 2014-12-24 VITALS — BP 122/70 | HR 65 | Temp 97.6°F | Resp 18 | Ht 66.75 in | Wt 189.8 lb

## 2014-12-24 DIAGNOSIS — R05 Cough: Secondary | ICD-10-CM

## 2014-12-24 DIAGNOSIS — I2510 Atherosclerotic heart disease of native coronary artery without angina pectoris: Secondary | ICD-10-CM | POA: Diagnosis not present

## 2014-12-24 DIAGNOSIS — I10 Essential (primary) hypertension: Secondary | ICD-10-CM | POA: Diagnosis not present

## 2014-12-24 DIAGNOSIS — R059 Cough, unspecified: Secondary | ICD-10-CM

## 2014-12-24 LAB — POCT CBC
Granulocyte percent: 70.3 %G (ref 37–80)
HCT, POC: 46.1 % (ref 43.5–53.7)
Hemoglobin: 15.1 g/dL (ref 14.1–18.1)
Lymph, poc: 1.5 (ref 0.6–3.4)
MCH, POC: 29.5 pg (ref 27–31.2)
MCHC: 32.7 g/dL (ref 31.8–35.4)
MCV: 90 fL (ref 80–97)
MID (cbc): 0.3 (ref 0–0.9)
MPV: 9.5 fL (ref 0–99.8)
POC Granulocyte: 4.1 (ref 2–6.9)
POC LYMPH PERCENT: 25.2 %L (ref 10–50)
POC MID %: 4.5 %M (ref 0–12)
Platelet Count, POC: 102 10*3/uL — AB (ref 142–424)
RBC: 5.12 M/uL (ref 4.69–6.13)
RDW, POC: 16.4 %
WBC: 5.9 10*3/uL (ref 4.6–10.2)

## 2014-12-24 MED ORDER — HYDROCODONE-HOMATROPINE 5-1.5 MG/5ML PO SYRP: 5.0000 mL | ORAL_SOLUTION | Freq: Three times a day (TID) | ORAL | Status: DC | PRN

## 2014-12-24 MED ORDER — AMLODIPINE BESYLATE 2.5 MG PO TABS
2.5000 mg | ORAL_TABLET | Freq: Every day | ORAL | Status: DC
Start: 2014-12-24 — End: 2014-12-26

## 2014-12-24 MED ORDER — BENZONATATE 200 MG PO CAPS: 200.0000 mg | ORAL_CAPSULE | Freq: Two times a day (BID) | ORAL | Status: DC | PRN

## 2014-12-24 MED ORDER — MOMETASONE FURO-FORMOTEROL FUM 100-5 MCG/ACT IN AERO: 2.0000 | INHALATION_SPRAY | Freq: Two times a day (BID) | RESPIRATORY_TRACT | Status: DC

## 2014-12-24 NOTE — Progress Notes (Signed)
   Subjective:    Patient ID: [name], male    DOB: July 11, 1941, 74 y.o.   MRN: [id number] This chart was scribed for Robyn Haber, MD by Zola Button, Medical Scribe. This patient was seen in Room 5 and the patient's care was started at 9:18 AM.   HPI HPI Comments: [name] is a 74 y.o. male who presents to the [facility] for a follow-up for bronchitis, with symptoms that started 2 weeks ago. Patient states he still has persistent cough and chest congestion. The cough is worse at night, even before sleeping. He did finish the course of Z-pack 2-3 days ago. Patient denies vomiting and abdominal pain. 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  My note from 12/18/14: [name] is a 74 y.o. male who presents to the [facility] complaining of gradual onset URI symptoms that started last week. Patient reports having chest congestion and dry cough. He also reports having chills yesterday. Patient was seen here 4 days ago by Harrison Mons, PA-C, and was given a prescription for Tussionex. His symptoms had improved overall, but he still has cough and chest congestion.The cough does interrupt his sleep. Patient denies fever and diaphoresis. He also denies hx of asthma.   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  Review of Systems  Constitutional: Negative for fever.  HENT: Positive for congestion.   Respiratory: Positive for cough.        Objective:   Physical Exam CONSTITUTIONAL: Well developed/well nourished HEAD: Normocephalic/atraumatic EYES: EOM/PERRL ENMT: Mucous membranes moist NECK: supple no meningeal signs SPINE: entire spine nontender CV: S1/S2 noted, no murmurs/rubs/gallops noted LUNGS: Lungs are clear to auscultation bilaterally, no apparent distress ABDOMEN: soft, nontender, no rebound or guarding GU: no cva tenderness NEURO: Pt is awake/alert, moves all  extremitiesx4 EXTREMITIES: pulses normal, full ROM SKIN: warm, color normal. Flat warts on the back of his right hand. Erythematous macules on the right side of his nose. PSYCH: no abnormalities of mood noted  [facility] (PRIMARY) x-ray report read by Dr. Joseph Art: Persistent elevated right hemidiaphragm. He also has some peribronchial cuffing. Thoracic degenerative spondylosis.       Assessment & Plan:   This chart was scribed in my presence and reviewed by me personally.    ICD-9-CM ICD-10-CM   1. Cough 786.2 R05 [name] 2 View     [name] 2 View     POCT CBC     HYDROcodone-homatropine (HYCODAN) 5-1.5 MG/5ML syrup     benzonatate (TESSALON) 200 MG capsule     mometasone-formoterol (DULERA) 100-5 MCG/ACT AERO     Signed, Robyn Haber, MD

## 2014-12-24 NOTE — Patient Instructions (Signed)

## 2014-12-26 ENCOUNTER — Other Ambulatory Visit: Payer: Self-pay

## 2014-12-26 DIAGNOSIS — R059 Cough, unspecified: Secondary | ICD-10-CM

## 2014-12-26 DIAGNOSIS — R05 Cough: Secondary | ICD-10-CM

## 2014-12-26 DIAGNOSIS — I10 Essential (primary) hypertension: Secondary | ICD-10-CM

## 2014-12-26 MED ORDER — AMLODIPINE BESYLATE 2.5 MG PO TABS: 2.5000 mg | ORAL_TABLET | Freq: Every day | ORAL | Status: DC

## 2014-12-27 ENCOUNTER — Other Ambulatory Visit: Payer: Self-pay

## 2014-12-27 MED ORDER — SIMVASTATIN 20 MG PO TABS: 20.0000 mg | ORAL_TABLET | Freq: Every day | ORAL | Status: DC

## 2015-02-01 IMAGING — CR DG CHEST 2V
2 series · 2 of 2 positions shown · non-contrast
Comparison: 06/01/2012

CLINICAL DATA: Cough. Fever. Chills.

EXAM:
CHEST  2 VIEW

[PA]
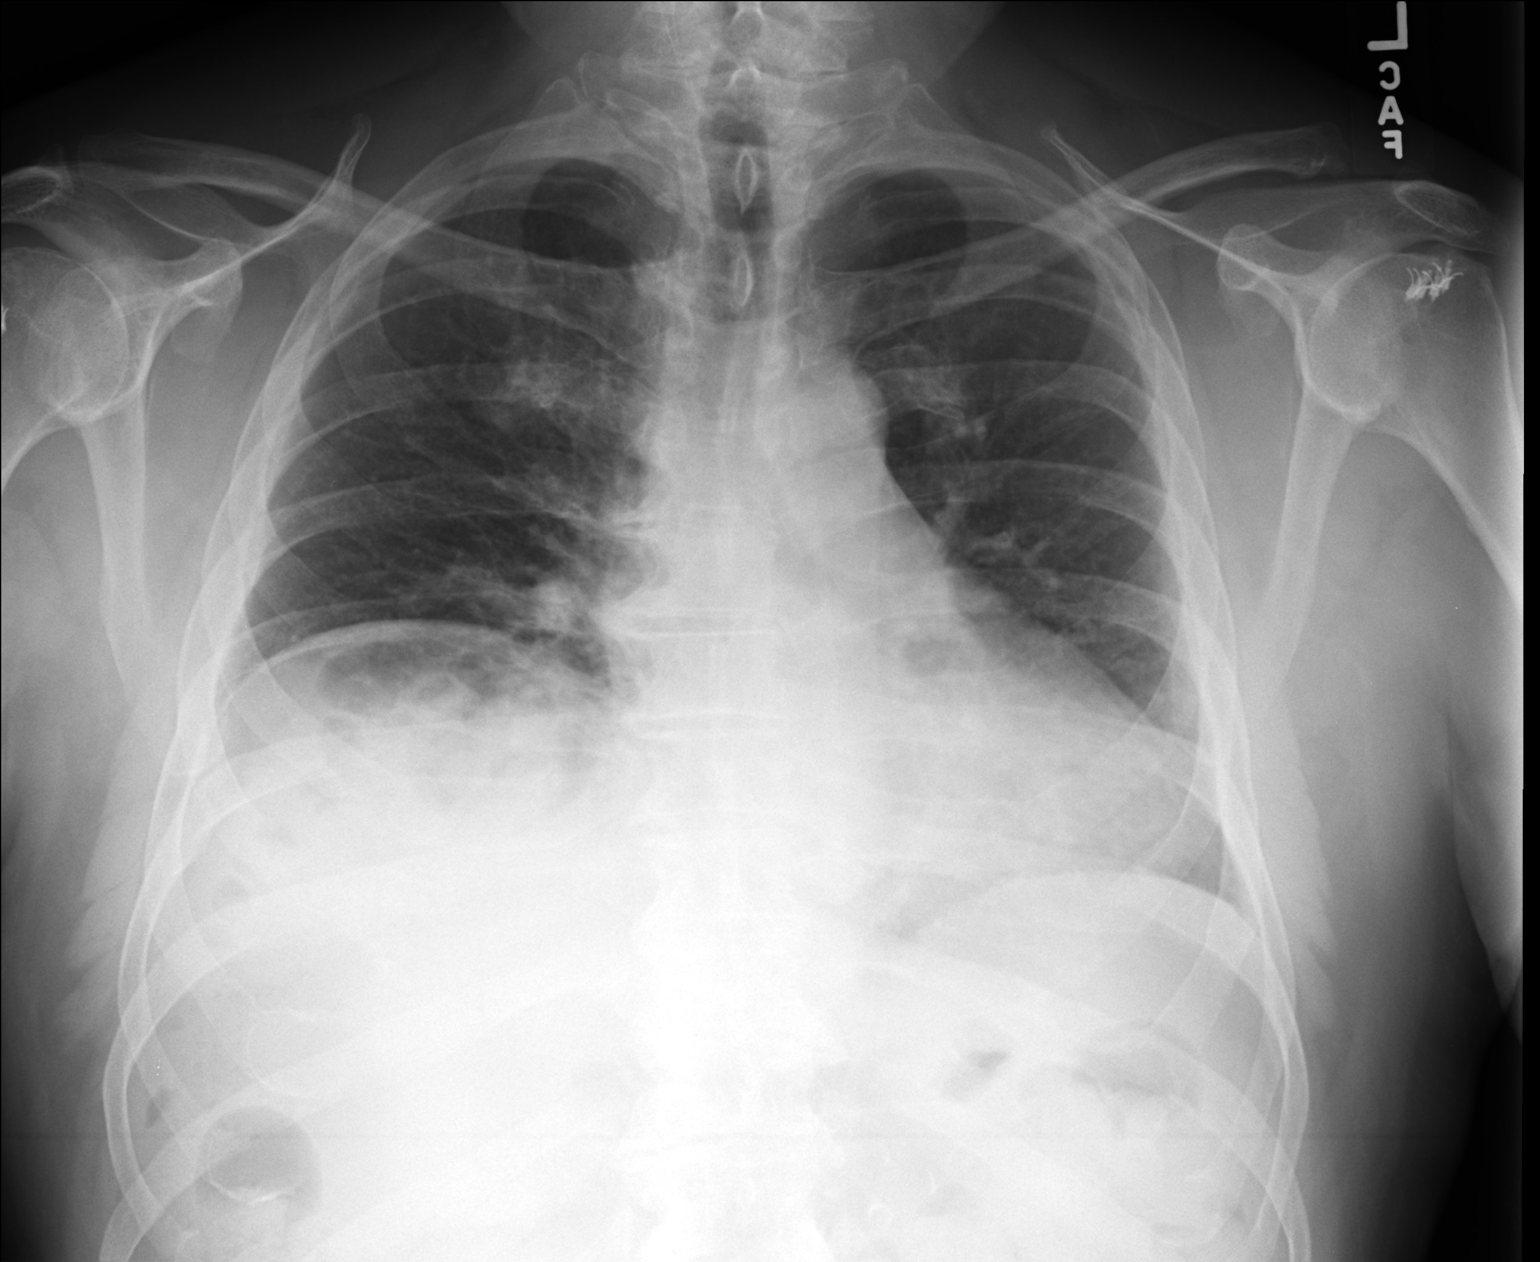

[lateral]
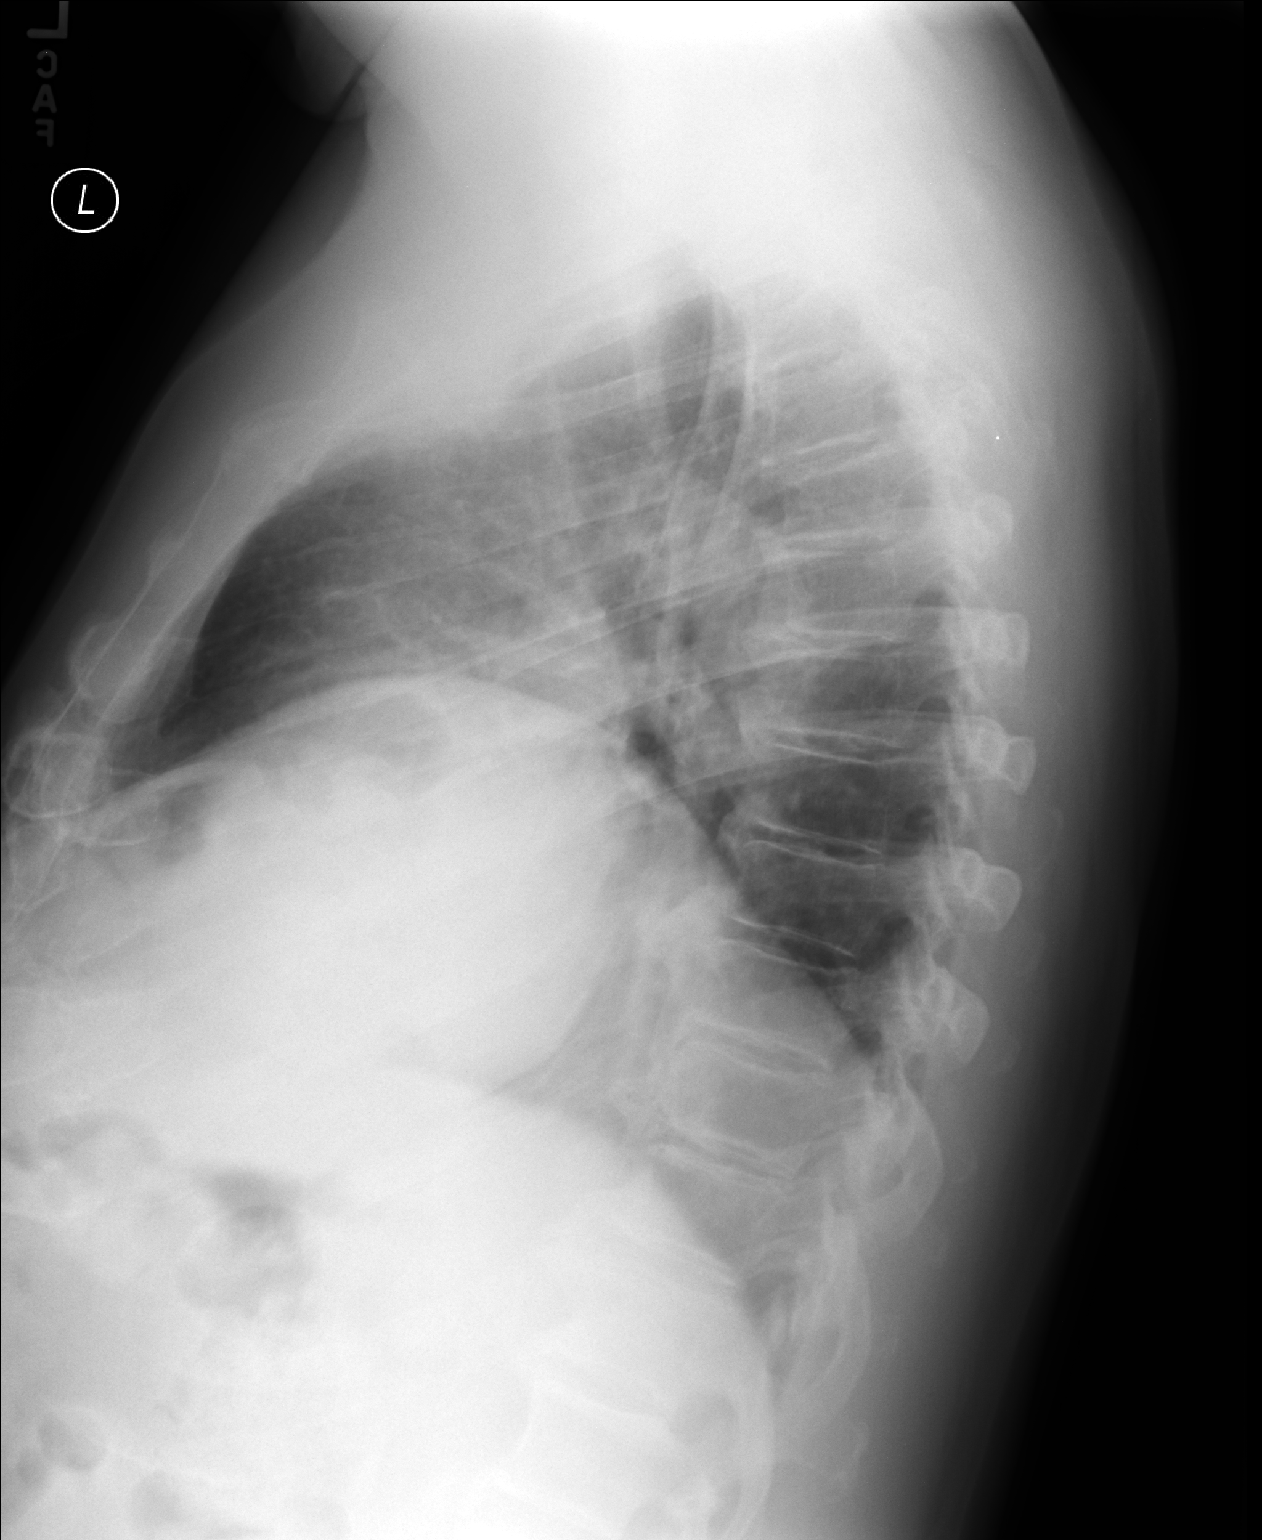

[2 of 2 positions shown; findings below may reference images not displayed]

FINDINGS: Chronic elevation of the right hemidiaphragm. Mild cardiomegaly.
Thoracic spondylosis. Density in the vicinity of the right 1st
costosternal junction appears to been present back through 0882 and
accordingly is likely from bony spurring rather than an underlying
nodule.

Linear perihilar opacity on the right favors atelectasis. Mild
airway thickening noted.

No pleural effusion identified.
IMPRESSION: 1. Airway thickening is present, suggesting bronchitis or reactive
airways disease.
2. Right perihilar subsegmental atelectasis.
3. Low lung volumes.
4. Chronically elevated right hemidiaphragm.
5. Mild cardiomegaly.

## 2015-03-10 DIAGNOSIS — H04123 Dry eye syndrome of bilateral lacrimal glands: Secondary | ICD-10-CM | POA: Diagnosis not present

## 2015-03-10 DIAGNOSIS — H3531 Nonexudative age-related macular degeneration: Secondary | ICD-10-CM | POA: Diagnosis not present

## 2015-03-10 DIAGNOSIS — H43813 Vitreous degeneration, bilateral: Secondary | ICD-10-CM | POA: Diagnosis not present

## 2015-03-10 DIAGNOSIS — H2513 Age-related nuclear cataract, bilateral: Secondary | ICD-10-CM | POA: Diagnosis not present

## 2015-03-31 ENCOUNTER — Ambulatory Visit (INDEPENDENT_AMBULATORY_CARE_PROVIDER_SITE_OTHER): Payer: Medicare Other

## 2015-03-31 ENCOUNTER — Ambulatory Visit (INDEPENDENT_AMBULATORY_CARE_PROVIDER_SITE_OTHER): Payer: Medicare Other | Admitting: Family Medicine

## 2015-03-31 VITALS — BP 118/84 | HR 52 | Temp 98.4°F | Resp 18 | Ht 66.0 in | Wt 192.0 lb

## 2015-03-31 DIAGNOSIS — I2510 Atherosclerotic heart disease of native coronary artery without angina pectoris: Secondary | ICD-10-CM

## 2015-03-31 DIAGNOSIS — R51 Headache: Secondary | ICD-10-CM

## 2015-03-31 DIAGNOSIS — R519 Headache, unspecified: Secondary | ICD-10-CM

## 2015-03-31 NOTE — Patient Instructions (Signed)
I do not think that your feeling is anything of concern, but let me know if your symptoms do not go away.  If your symptoms do not clear up in the next 7- 10 days give me a call or email and we will look further as needed

## 2015-03-31 NOTE — Progress Notes (Signed)
[facility] [address], [place]  [address] [phone]  Date:  03/31/2015   Name:  [name]   DOB:  1940/11/06   MRN:  [id number]  PCP:  Garnet Koyanagi, DO    Chief Complaint: head pressure   History of Present Illness:  [name] is a 74 y.o. very pleasant male patient who presents with the following:  He has noted a "pressure" in the right posterior part of his head- it has been present for about one week. It is not a headache It is not getting better but is not getting worse either.  He got concerned and wanted to be seen No skin tenderness, no rash He is not aware of any injury to his head This does not really feel like his AS stiffness and pain No URI sx, no cough, no fever No acute vision change, he has a long history of tinnitus- this is stable.  No hearing change    Patient Active Problem List   Diagnosis Date Noted  . Benign mole 02/04/2014  . Impaired glucose tolerance 10/26/2013  . NAFLD (nonalcoholic fatty liver disease) 04/27/2013  . Thrombocytopenia 10/21/2012  . Exertional angina 09/03/2012  . MALARIA 12/05/2009  . COLONIC POLYPS 12/05/2009  . HYPERCHOLESTEROLEMIA 12/05/2009  . ANXIETY 12/05/2009  . HEMORRHOIDS 12/05/2009  . ALLERGIC RHINITIS 12/05/2009  . ANKYLOSING SPONDYLITIS 12/05/2009  . GANGLION CYST, WRIST, LEFT 12/05/2009  . ROTATOR CUFF TEAR 12/05/2009  . OTHER DISORDER OF MUSCLE LIGAMENT AND FASCIA 12/05/2009  . ABNORMAL CHEST XRAY 12/05/2009  . CAD, NATIVE VESSEL 04/05/2009    Past Medical History  Diagnosis Date  . Coronary atherosclerosis of native coronary artery   . Hyperlipidemia     with low HDL  . Ankylosing spondylitis   . Hypertension   . Allergic rhinitis   . Cough   . Abnormal chest x-ray   . Colonic polyp   . Hemorrhoids   . Rotator cuff tear   . Ganglion cyst of wrist     left  . Other disorder of muscle, ligament, and fascia   . Anxiety   . History of malaria   . Arthritis    . Kidney stone   . Heart attack 2008    Past Surgical History  Procedure Laterality Date  . Rotator cuff repair      bilat by Dr. French Ana  . Hemorrhoid surgery  10/10    in [place]  . Left heart catheterization with coronary angiogram N/A 09/03/2012    Procedure: LEFT HEART CATHETERIZATION WITH CORONARY ANGIOGRAM;  Surgeon: [name], MD;  Location: [facility] CATH LAB;  Service: Cardiovascular;  Laterality: N/A;    Social History  Substance Use Topics  . Smoking status: Never Smoker   . Smokeless tobacco: Never Used  . Alcohol Use: No     Comment: occ 1 beer, wine occ    Family History  Problem Relation Age of Onset  . Cancer Mother     melanoma  . Emphysema Mother   . Rheum arthritis Mother   . Diabetes Mother     No Known Allergies  Medication list has been reviewed and updated.  Current Outpatient Prescriptions on File Prior to Visit  Medication Sig Dispense Refill  . amLODipine (NORVASC) 2.5 MG tablet Take 1 tablet (2.5 mg total) by mouth daily. 90 tablet 3  . aspirin 81 MG EC tablet Take 81 mg by mouth at bedtime.     . simvastatin (ZOCOR)  20 MG tablet Take 1 tablet (20 mg total) by mouth daily with breakfast. 90 tablet 1  . benzonatate (TESSALON) 200 MG capsule Take 1 capsule (200 mg total) by mouth 2 (two) times daily as needed for cough. (Patient not taking: Reported on 03/31/2015) 20 capsule 0  . HYDROcodone-homatropine (HYCODAN) 5-1.5 MG/5ML syrup Take 5 mLs by mouth every 8 (eight) hours as needed for cough. (Patient not taking: Reported on 03/31/2015) 120 mL 0  . mometasone-formoterol (DULERA) 100-5 MCG/ACT AERO Inhale 2 puffs into the lungs 2 (two) times daily. (Patient not taking: Reported on 03/31/2015) 1 Inhaler 2   No current facility-administered medications on file prior to visit.    Review of Systems:  As per HPI- otherwise negative.   Physical Examination: Filed Vitals:   03/31/15 1324  BP: 118/84  Pulse: 52  Temp: 98.4 F (36.9 C)  Resp: 18    Filed Vitals:   03/31/15 1324  Height: 5\' 6"  (1.676 m)  Weight: 192 lb (87.091 kg)   Body mass index is 31 kg/(m^2). Ideal Body Weight: Weight in (lb) to have BMI = 25: 154.6  GEN: WDWN, NAD, Non-toxic, A & O x 3, overweight, looks well HEENT: Atraumatic, Normocephalic. Neck supple. No masses, No LAD.  Bilateral TM wnl, oropharynx normal.  PEERL,EOMI.   Ears and Nose: No external deformity. CV: RRR, No M/G/R. No JVD. No thrill. No extra heart sounds.  Pulse rate normal to my exam PULM: CTA B, no wheezes, crackles, rhonchi. No retractions. No resp. distress. No accessory muscle use. EXTR: No c/c/e NEURO Normal gait.  PSYCH: Normally interactive. Conversant. Not depressed or anxious appearing.  Calm demeanor.  Normal strength and sensation of BUE He notes mild tenderness just under the posterior nuchal line, right side only; feels like muscular tissue that is sore No skin lesion, no abscess or cellulitis  Normal ROM of the neck  [facility] reading (PRIMARY) by  Dr. Lorelei Pont. Skull: negative Cervical spine: degenerative changes, history of ankylosing spondylitis   CERVICAL SPINE - 2-3 VIEW  COMPARISON: 05/31/2014  FINDINGS: There is moderately severe left facet arthritis at C4-5 and moderately severe right facet arthritis at C5-6. There is no disc space narrowing or subluxation. No prevertebral soft tissue swelling. No change since the prior exam.  IMPRESSION: No acute abnormality. Degenerative facet arthritis in the cervical spine.  CLINICAL DATA: Right posterior headache and pressure. No known injury.  EXAM: SKULL - COMPLETE 4 + VIEW  COMPARISON: None.  FINDINGS: There is no evidence of skull fracture or other focal bone lesions.  IMPRESSION: Negative.  Pulse Readings from Last 3 Encounters:  03/31/15 52  12/24/14 65  12/20/14 65   Assessment and Plan: Pressure in head - Plan: DG Skull Complete, DG Cervical Spine 2 or 3 views  Unusual pressure in the back  of his head of unclear etiology.  However suspect this is MSK related and due to muscular attachement to the posterior nuchal line.   He has been concerned about a tumor, and normal x-rays are reassuring.  He will keep me updated as to his progress and alert me if any change or worsening of his sx   Signed Lamar Blinks, MD

## 2015-04-18 ENCOUNTER — Encounter: Payer: Self-pay | Admitting: Internal Medicine

## 2015-04-18 ENCOUNTER — Ambulatory Visit (HOSPITAL_BASED_OUTPATIENT_CLINIC_OR_DEPARTMENT_OTHER): Payer: Medicare Other | Admitting: Internal Medicine

## 2015-04-18 ENCOUNTER — Other Ambulatory Visit (HOSPITAL_BASED_OUTPATIENT_CLINIC_OR_DEPARTMENT_OTHER): Payer: Medicare Other

## 2015-04-18 ENCOUNTER — Telehealth: Payer: Self-pay | Admitting: Internal Medicine

## 2015-04-18 VITALS — BP 138/70 | HR 54 | Temp 99.5°F | Resp 18 | Ht 66.0 in | Wt 190.1 lb

## 2015-04-18 DIAGNOSIS — D696 Thrombocytopenia, unspecified: Secondary | ICD-10-CM | POA: Diagnosis not present

## 2015-04-18 LAB — CBC WITH DIFFERENTIAL/PLATELET
BASO%: 1.2 % (ref 0.0–2.0)
Basophils Absolute: 0 10*3/uL (ref 0.0–0.1)
EOS ABS: 0 10*3/uL (ref 0.0–0.5)
EOS%: 0.2 % (ref 0.0–7.0)
HEMATOCRIT: 43.4 % (ref 38.4–49.9)
HEMOGLOBIN: 14.5 g/dL (ref 13.0–17.1)
LYMPH#: 1 10*3/uL (ref 0.9–3.3)
LYMPH%: 26.1 % (ref 14.0–49.0)
MCH: 29.9 pg (ref 27.2–33.4)
MCHC: 33.4 g/dL (ref 32.0–36.0)
MCV: 89.4 fL (ref 79.3–98.0)
MONO#: 0.6 10*3/uL (ref 0.1–0.9)
MONO%: 15.4 % — ABNORMAL HIGH (ref 0.0–14.0)
NEUT%: 57.1 % (ref 39.0–75.0)
NEUTROS ABS: 2.2 10*3/uL (ref 1.5–6.5)
PLATELETS: 71 10*3/uL — AB (ref 140–400)
RBC: 4.85 10*6/uL (ref 4.20–5.82)
RDW: 15.4 % — AB (ref 11.0–14.6)
WBC: 3.9 10*3/uL — AB (ref 4.0–10.3)

## 2015-04-18 LAB — COMPREHENSIVE METABOLIC PANEL (CC13)
ALBUMIN: 4.1 g/dL (ref 3.5–5.0)
ALK PHOS: 52 U/L (ref 40–150)
ALT: 29 U/L (ref 0–55)
ANION GAP: 6 meq/L (ref 3–11)
AST: 21 U/L (ref 5–34)
BILIRUBIN TOTAL: 0.76 mg/dL (ref 0.20–1.20)
BUN: 19.1 mg/dL (ref 7.0–26.0)
CO2: 27 meq/L (ref 22–29)
CREATININE: 0.9 mg/dL (ref 0.7–1.3)
Calcium: 9.1 mg/dL (ref 8.4–10.4)
Chloride: 109 mEq/L (ref 98–109)
EGFR: 85 mL/min/{1.73_m2} — ABNORMAL LOW (ref >90)
Glucose: 116 mg/dl (ref 70–140)
Potassium: 4.2 mEq/L (ref 3.5–5.1)
Sodium: 142 mEq/L (ref 136–145)
TOTAL PROTEIN: 6.7 g/dL (ref 6.4–8.3)

## 2015-04-18 LAB — LACTATE DEHYDROGENASE (CC13): LDH: 103 U/L — ABNORMAL LOW (ref 125–245)

## 2015-04-18 NOTE — Progress Notes (Signed)
[place] Telephone:([phone]   Fax:(336) [phone]  OFFICE PROGRESS NOTE  Garnet Koyanagi, DO [place] [address] [place] [address]  DIAGNOSIS: Thrombocytopenia, most likely idiopathic thrombocytopenic purpura.  PRIOR THERAPY:None.  CURRENT THERAPY: observation.  INTERVAL HISTORY: [name] 74 y.o. male returns to the clinic today for routine six-month followup visit. The patient is feeling fine today with no specific complaints. He denied having any significant bleeding issues, bruises or ecchymosis. The patient has no chest pain, shortness of breath, cough or hemoptysis. He denied having any significant weight loss or night sweats. He has repeat CBC performed earlier today and is here for evaluation and discussion of his lab results.  MEDICAL HISTORY: Past Medical History  Diagnosis Date  . Coronary atherosclerosis of native coronary artery   . Hyperlipidemia     with low HDL  . Ankylosing spondylitis   . Hypertension   . Allergic rhinitis   . Cough   . Abnormal chest x-ray   . Colonic polyp   . Hemorrhoids   . Rotator cuff tear   . Ganglion cyst of wrist     left  . Other disorder of muscle, ligament, and fascia   . Anxiety   . History of malaria   . Arthritis   . Kidney stone   . Heart attack 2008    ALLERGIES:  has No Known Allergies.  MEDICATIONS:  Current Outpatient Prescriptions  Medication Sig Dispense Refill  . amLODipine (NORVASC) 2.5 MG tablet Take 1 tablet (2.5 mg total) by mouth daily. 90 tablet 3  . aspirin 81 MG EC tablet Take 81 mg by mouth at bedtime.     . simvastatin (ZOCOR) 20 MG tablet Take 1 tablet (20 mg total) by mouth daily with breakfast. 90 tablet 1   No current facility-administered medications for this visit.    SURGICAL HISTORY:  Past Surgical History  Procedure Laterality Date  . Rotator cuff repair      bilat by Dr. French Ana  . Hemorrhoid surgery  10/10    in [place]  . Left heart  catheterization with coronary angiogram N/A 09/03/2012    Procedure: LEFT HEART CATHETERIZATION WITH CORONARY ANGIOGRAM;  Surgeon: [name], MD;  Location: [facility] CATH LAB;  Service: Cardiovascular;  Laterality: N/A;    REVIEW OF SYSTEMS:  A comprehensive review of systems was negative.   PHYSICAL EXAMINATION: General appearance: alert, cooperative and no distress Head: Normocephalic, without obvious abnormality, atraumatic Neck: no adenopathy Lymph nodes: Cervical, supraclavicular, and axillary nodes normal. Resp: clear to auscultation bilaterally Cardio: regular rate and rhythm, S1, S2 normal, no murmur, click, rub or gallop GI: soft, non-tender; bowel sounds normal; no masses,  no organomegaly Extremities: extremities normal, atraumatic, no cyanosis or edema  ECOG PERFORMANCE STATUS: 0 - Asymptomatic  There were no vitals taken for this visit.  LABORATORY DATA: Lab Results  Component Value Date   WBC 3.9* 04/18/2015   HGB 14.5 04/18/2015   HCT 43.4 04/18/2015   MCV 89.4 04/18/2015   PLT 71* 04/18/2015      Chemistry      Component Value Date/Time   NA 143 10/19/2014 0817   NA 140 09/21/2014 0839   K 4.2 10/19/2014 0817   K 4.2 09/21/2014 0839   CL 105 09/21/2014 0839   CL 106 09/30/2012 0949   CO2 24 10/19/2014 0817   CO2 29 09/21/2014 0839   BUN 22.7 10/19/2014 0817   BUN  19 09/21/2014 0839   CREATININE 0.8 10/19/2014 0817   CREATININE 0.88 09/21/2014 0839      Component Value Date/Time   CALCIUM 9.3 10/19/2014 0817   CALCIUM 9.5 09/21/2014 0839   ALKPHOS 58 10/19/2014 0817   ALKPHOS 53 08/03/2012 1019   AST 17 10/19/2014 0817   AST 42* 08/03/2012 1019   ALT 19 10/19/2014 0817   ALT 62* 08/03/2012 1019   BILITOT 0.47 10/19/2014 0817   BILITOT 1.1 08/03/2012 1019     BONE MARROW REPORT FINAL DIAGNOSIS RADIOGRAPHIC STUDIES: No results found.  ASSESSMENT AND PLAN: This is a very pleasant 74 years old white male with thrombocytopenia most likely  idiopathic thrombocytopenic purpura.  His platelets count are low at 71,000 but the patient continues to be asymptomatic.   I recommended for him to continue on observation for now as he is currently asymptomatic. I would see him back for followup visit in 6 months with repeat CBC and LDH. I advised the patient to call me immediately if he has any significant bleeding issues, bruises or ecchymosis or if his platelets count to be less than 50,000.  All questions were answered. The patient knows to call the clinic with any problems, questions or concerns. We can certainly see the patient much sooner if necessary.  Disclaimer: This note was dictated with voice recognition software. Similar sounding words can inadvertently be transcribed and may not be corrected upon review.

## 2015-04-18 NOTE — Telephone Encounter (Signed)
Gave adn printed appt sched and avs for pt for March 2017 °

## 2015-06-08 ENCOUNTER — Other Ambulatory Visit: Payer: Self-pay | Admitting: Cardiovascular Disease

## 2015-07-14 DIAGNOSIS — D1801 Hemangioma of skin and subcutaneous tissue: Secondary | ICD-10-CM | POA: Diagnosis not present

## 2015-07-14 DIAGNOSIS — L821 Other seborrheic keratosis: Secondary | ICD-10-CM | POA: Diagnosis not present

## 2015-07-14 DIAGNOSIS — L812 Freckles: Secondary | ICD-10-CM | POA: Diagnosis not present

## 2015-07-14 DIAGNOSIS — L570 Actinic keratosis: Secondary | ICD-10-CM | POA: Diagnosis not present

## 2015-07-14 DIAGNOSIS — Z23 Encounter for immunization: Secondary | ICD-10-CM | POA: Diagnosis not present

## 2015-07-14 DIAGNOSIS — D225 Melanocytic nevi of trunk: Secondary | ICD-10-CM | POA: Diagnosis not present

## 2015-07-14 DIAGNOSIS — B351 Tinea unguium: Secondary | ICD-10-CM | POA: Diagnosis not present

## 2015-08-18 ENCOUNTER — Ambulatory Visit (INDEPENDENT_AMBULATORY_CARE_PROVIDER_SITE_OTHER): Payer: Medicare Other | Admitting: Physician Assistant

## 2015-08-18 VITALS — BP 132/70 | HR 73 | Temp 98.8°F | Resp 18 | Ht 66.0 in | Wt 192.0 lb

## 2015-08-18 DIAGNOSIS — J069 Acute upper respiratory infection, unspecified: Secondary | ICD-10-CM | POA: Diagnosis not present

## 2015-08-18 MED ORDER — AMOXICILLIN-POT CLAVULANATE 875-125 MG PO TABS: 1.0000 | ORAL_TABLET | Freq: Two times a day (BID) | ORAL | Status: AC

## 2015-08-18 MED ORDER — GUAIFENESIN ER 1200 MG PO TB12: 1.0000 | ORAL_TABLET | Freq: Two times a day (BID) | ORAL | Status: DC | PRN

## 2015-08-18 MED ORDER — IPRATROPIUM BROMIDE 0.03 % NA SOLN: 2.0000 | Freq: Two times a day (BID) | NASAL | Status: DC

## 2015-08-18 MED ORDER — AMOXICILLIN-POT CLAVULANATE 875-125 MG PO TABS: 1.0000 | ORAL_TABLET | Freq: Two times a day (BID) | ORAL | Status: DC

## 2015-08-18 NOTE — Progress Notes (Signed)
[facility] [address] [place] [address] [phone]  Date:  08/18/2015   Name:  [name]   DOB:  Jul 10, 1941   MRN:  [id number]  PCP:  Garnet Koyanagi, DO    Chief Complaint: chest congestion; Nasal Congestion; Ear Pain; and Facial Pain   History of Present Illness:  This is a 75 y.o. male with PMH CAD, HLD, HTN, thrombocytopenia, NAFLD who is presenting with chest congestion, nasal congestion, facial pain and left otalgia x 2 days.  Cough: Mostly dry SOB/wheezing: some mild wheezing. Nasal congestion: yes Otalgia: yes left, anterior to ear Sore throat: no Fever/chills: no Aggravating/alleviating factors: hydration, hot tea History of asthma: no History of env allergies: used to have problems with allergies. 20 years ago. No problems since. Tobacco use: no 3 days ago returned from cruise to [place]. Grandchildren with coughs   Review of Systems:  Review of Systems See HPI  Patient Active Problem List   Diagnosis Date Noted  . Benign mole 02/04/2014  . Impaired glucose tolerance 10/26/2013  . NAFLD (nonalcoholic fatty liver disease) 04/27/2013  . Thrombocytopenia ([place]) 10/21/2012  . Exertional angina (H[facility]) 09/03/2012  . MALARIA 12/05/2009  . COLONIC POLYPS 12/05/2009  . HYPERCHOLESTEROLEMIA 12/05/2009  . ANXIETY 12/05/2009  . HEMORRHOIDS 12/05/2009  . ALLERGIC RHINITIS 12/05/2009  . ANKYLOSING SPONDYLITIS 12/05/2009  . GANGLION CYST, WRIST, LEFT 12/05/2009  . ROTATOR CUFF TEAR 12/05/2009  . OTHER DISORDER OF MUSCLE LIGAMENT AND FASCIA 12/05/2009  . ABNORMAL CHEST XRAY 12/05/2009  . CAD, NATIVE VESSEL 04/05/2009    Prior to Admission medications   Medication Sig Start Date End Date Taking? Authorizing Provider  amLODipine (NORVASC) 2.5 MG tablet Take 1 tablet (2.5 mg total) by mouth daily. 12/26/14  Yes [name], MD  aspirin 81 MG EC tablet Take 81 mg by mouth at bedtime.    Yes Historical Provider, MD  simvastatin  (ZOCOR) 20 MG tablet TAKE 1 TABLET EVERY DAY WITH BREAKFAST 06/08/15  Yes [name], MD    No Known Allergies  Past Surgical History  Procedure Laterality Date  . Rotator cuff repair      bilat by Dr. French Ana  . Hemorrhoid surgery  10/10    in [place]  . Left heart catheterization with coronary angiogram N/A 09/03/2012    Procedure: LEFT HEART CATHETERIZATION WITH CORONARY ANGIOGRAM;  Surgeon: [name], MD;  Location: [facility] CATH LAB;  Service: Cardiovascular;  Laterality: N/A;    Social History  Substance Use Topics  . Smoking status: Never Smoker   . Smokeless tobacco: Never Used  . Alcohol Use: No     Comment: occ 1 beer, wine occ    Family History  Problem Relation Age of Onset  . Cancer Mother     melanoma  . Emphysema Mother   . Rheum arthritis Mother   . Diabetes Mother     Medication list has been reviewed and updated.  Physical Examination:  Physical Exam  Constitutional: He is oriented to person, place, and time. He appears well-developed and well-nourished. No distress.  HENT:  Head: Normocephalic and atraumatic.  Right Ear: Hearing, tympanic membrane, external ear and ear canal normal.  Left Ear: Hearing, tympanic membrane, external ear and ear canal normal.  Nose: Right sinus exhibits no maxillary sinus tenderness and no frontal sinus tenderness. Left sinus exhibits maxillary sinus tenderness. Left sinus exhibits no frontal sinus tenderness.  Mouth/Throat: Uvula is midline, oropharynx is clear and moist and mucous membranes  are normal.  Bilateral TMs partially obstructed by cerumen  Eyes: Conjunctivae and lids are normal. Right eye exhibits no discharge. Left eye exhibits no discharge. No scleral icterus.  Cardiovascular: Normal rate, regular rhythm, normal heart sounds and normal pulses.   No murmur heard. Pulmonary/Chest: Effort normal and breath sounds normal. No respiratory distress. He has no wheezes. He has no rhonchi. He has no rales.   Musculoskeletal: Normal range of motion.  Lymphadenopathy:       Head (right side): No submental, no submandibular and no tonsillar adenopathy present.       Head (left side): No submental, no submandibular and no tonsillar adenopathy present.    He has no cervical adenopathy.  Neurological: He is alert and oriented to person, place, and time.  Skin: Skin is warm, dry and intact. No lesion and no rash noted.  Psychiatric: He has a normal mood and affect. His speech is normal and behavior is normal. Thought content normal.    BP 132/70 mmHg  Pulse 73  Temp(Src) 98.8 F (37.1 C) (Oral)  Resp 18  Ht 5\' 6"  (1.676 m)  Wt 192 lb (87.091 kg)  BMI 31.00 kg/m2  SpO2 94%  Assessment and Plan:  1. Viral URI Likely viral URI. Focus on measures to prevent from develop sinusitis. Mucinex, atrovent prescribed. Home remedies discussed. Low threshold for prescribing abx d/t already point tender left maxillary sinus and age. Provided print out for augmentin to fill in 3 days if symptoms are not improving. - Guaifenesin (MUCINEX MAXIMUM STRENGTH) 1200 MG TB12; Take 1 tablet (1,200 mg total) by mouth every 12 (twelve) hours as needed.  Dispense: 14 tablet; Refill: 1 - ipratropium (ATROVENT) 0.03 % nasal spray; Place 2 sprays into both nostrils 2 (two) times daily.  Dispense: 30 mL; Refill: 0 - amoxicillin-clavulanate (AUGMENTIN) 875-125 MG tablet; Take 1 tablet by mouth 2 (two) times daily. MAY FILL 08/21/15 THROUGH 08/28/15 ONLY  Dispense: 20 tablet; Refill: 0   Benjaman Pott. Drenda Freeze, MHS [facility]  08/18/2015

## 2015-08-18 NOTE — Patient Instructions (Signed)
If prescribed mucinex, take twice a day with plenty of water (64 oz per day). If prescribed nasal spray, use twice a day. Hot showers, breathing in steam from shower or pot of boiling water, eating spicy food and neti pot with sterile water can all help your sinuses drain. Drink plenty of water and get plenty of rest. If you are not getting better in 3 days, you may fill antibiotic.

## 2015-09-03 ENCOUNTER — Telehealth: Payer: Self-pay | Admitting: Physician Assistant

## 2015-09-03 NOTE — Telephone Encounter (Signed)
Patient need to talk with Bennett Scrape about his prescription she prescribed to him. The prescription expired. The prescription is Amoxicillin. Patient have a concern. He didn't state the concern. [phone].

## 2015-09-04 ENCOUNTER — Telehealth: Payer: Self-pay | Admitting: Cardiovascular Disease

## 2015-09-04 ENCOUNTER — Ambulatory Visit (INDEPENDENT_AMBULATORY_CARE_PROVIDER_SITE_OTHER): Payer: Medicare Other | Admitting: Physician Assistant

## 2015-09-04 VITALS — BP 122/70 | HR 72 | Temp 98.8°F | Resp 20 | Ht 66.54 in | Wt 190.0 lb

## 2015-09-04 DIAGNOSIS — J069 Acute upper respiratory infection, unspecified: Secondary | ICD-10-CM | POA: Diagnosis not present

## 2015-09-04 MED ORDER — AMOXICILLIN-POT CLAVULANATE 875-125 MG PO TABS: 1.0000 | ORAL_TABLET | Freq: Two times a day (BID) | ORAL | Status: DC

## 2015-09-04 NOTE — Telephone Encounter (Signed)
New message     Pt c/o medication issue:  1. Name of Medication: amlodipine  2.5 mg   2. How are you currently taking this medication (dosage and times per day)? In afternoon   3. Are you having a reaction (difficulty breathing--STAT)? Persistent cough   4. What is your medication issue? Wants to discuss with nurse.  / does medication need to be change

## 2015-09-04 NOTE — Telephone Encounter (Signed)
Spoke with pt. He never filled the augmentin because his symptoms got better with the nasal spray and mucinex. Symptoms started to get worse 1 week ago. He is coughing a lot, especially at night. When I saw him his symptoms were more sinus related. He has apparently had a chronic cough, off and on since may 2016. His bp meds have been changed several times and cough not getting any better. Last CXR 12/2014 normal. I advised patient he needs to return for further evaluation. Needs another CXR and possible referral to pulm.

## 2015-09-04 NOTE — Telephone Encounter (Signed)
I spoke with the pt and reviewed his chart.  The pt has complained of cough since May 2016. The pt's medications have been changed from Carvedilol to Losartan and then Amlodipine 12/24/14. I made the pt aware that cough is not a typical side effect of amlodipine.  The pt is trying to get his antibiotic Rx renewed through [facility] as he did not get this filled within the needed dates.  I instructed the pt to discuss his cough with PCP and determine if he needs further testing or possible referral to pulmonary. Pt agreed with plan.  I have arrange May appointment for the pt to see Dr Burt Knack.

## 2015-09-04 NOTE — Progress Notes (Signed)
09/04/2015 5:07 PM   DOB: 20-Jan-1941 / MRN: [id number]  SUBJECTIVE:  [name] is a 75 y.o. male presenting for a recheck of sinus pressure and pain. He was seen last by Ms. Bush PA-C who advised that he try symptomatic therapy and if this fails to fill Amox-Clav.   He did see some improvement and did not need to fill antibiotics but five days later began to have a double sickening.  Reports his symptoms "have come back with a vengeance" in particular nasal congestion.      He has No Known Allergies.   He  has a past medical history of Coronary atherosclerosis of native coronary artery; Hyperlipidemia; Ankylosing spondylitis ([place]); Hypertension; Allergic rhinitis; Cough; Abnormal chest x-ray; Colonic polyp; Hemorrhoids; Rotator cuff tear; Ganglion cyst of wrist; Other disorder of muscle, ligament, and fascia; Anxiety; History of malaria; Arthritis; Kidney stone; and Heart attack ([place]) (2008).    He  reports that he has never smoked. He has never used smokeless tobacco. He reports that he does not drink alcohol or use illicit drugs. He  reports that he currently engages in sexual activity. The patient  has past surgical history that includes Rotator cuff repair; Hemorrhoid surgery (10/10); and left heart catheterization with coronary angiogram (N/A, 09/03/2012).  His family history includes Cancer in his mother; Diabetes in his mother; Emphysema in his mother; Rheum arthritis in his mother.  Review of Systems  Constitutional: Positive for malaise/fatigue. Negative for fever, chills and diaphoresis.  HENT: Positive for congestion and sore throat.   Respiratory: Positive for cough. Negative for hemoptysis, shortness of breath and wheezing.   Cardiovascular: Negative for chest pain.  Gastrointestinal: Negative for nausea.  Skin: Negative for rash.  Neurological: Negative for dizziness and weakness.  Endo/Heme/Allergies: Negative for polydipsia.    Problem list and medications reviewed and  updated by myself where necessary, and exist elsewhere in the encounter.   OBJECTIVE:  BP 122/70 mmHg  Pulse 72  Temp(Src) 98.8 F (37.1 C) (Oral)  Resp 20  Ht 5' 6.53" (1.69 m)  Wt 190 lb (86.183 kg)  BMI 30.18 kg/m2  SpO2 94%  Physical Exam  Constitutional: He is oriented to person, place, and time. He appears well-developed. He does not appear ill.  Eyes: Conjunctivae and EOM are normal. Pupils are equal, round, and reactive to light.  Cardiovascular: Normal rate and regular rhythm.   Pulmonary/Chest: Effort normal and breath sounds normal. No respiratory distress. He has no wheezes. He has no rales. He exhibits no tenderness.  Abdominal: He exhibits no distension.  Musculoskeletal: Normal range of motion.  Neurological: He is alert and oriented to person, place, and time. No cranial nerve deficit. Coordination normal.  Skin: Skin is warm and dry. He is not diaphoretic.  Psychiatric: He has a normal mood and affect.  Nursing note and vitals reviewed.   No results found for this or any previous visit (from the past 72 hour(s)).  ASSESSMENT AND PLAN  [name] was seen today for follow-up.  Diagnoses and all orders for this visit:  Protracted URI: Will fulfill Ms. Caro Hight original plan.  No alarm symptoms today.  -     amoxicillin-clavulanate (AUGMENTIN) 875-125 MG tablet; Take 1 tablet by mouth 2 (two) times daily.    The patient was advised to call or return to clinic if he does not see an improvement in symptoms or to seek the care of the closest emergency department if he worsens with the above plan.  Philis Fendt, MHS, PA-C [facility] 09/04/2015 5:07 PM

## 2015-09-19 ENCOUNTER — Ambulatory Visit (INDEPENDENT_AMBULATORY_CARE_PROVIDER_SITE_OTHER): Payer: Medicare Other | Admitting: Internal Medicine

## 2015-09-19 VITALS — BP 124/76 | HR 67 | Temp 98.6°F | Resp 18 | Ht 66.0 in | Wt 188.2 lb

## 2015-09-19 DIAGNOSIS — J029 Acute pharyngitis, unspecified: Secondary | ICD-10-CM | POA: Diagnosis not present

## 2015-09-19 DIAGNOSIS — R05 Cough: Secondary | ICD-10-CM | POA: Diagnosis not present

## 2015-09-19 DIAGNOSIS — R059 Cough, unspecified: Secondary | ICD-10-CM

## 2015-09-19 LAB — POCT CBC
Granulocyte percent: 62.4 %G (ref 37–80)
HCT, POC: 43.4 % — AB (ref 43.5–53.7)
HEMOGLOBIN: 14.5 g/dL (ref 14.1–18.1)
LYMPH, POC: 1.8 (ref 0.6–3.4)
MCH, POC: 29.5 pg (ref 27–31.2)
MCHC: 33.4 g/dL (ref 31.8–35.4)
MCV: 88.4 fL (ref 80–97)
MID (cbc): 0.3 (ref 0–0.9)
MPV: 9.4 fL (ref 0–99.8)
PLATELET COUNT, POC: 85 10*3/uL — AB (ref 142–424)
POC Granulocyte: 3.5 (ref 2–6.9)
POC LYMPH PERCENT: 31.5 %L (ref 10–50)
POC MID %: 6.1 %M (ref 0–12)
RBC: 4.91 M/uL (ref 4.69–6.13)
RDW, POC: 16.3 %
WBC: 5.6 10*3/uL (ref 4.6–10.2)

## 2015-09-19 MED ORDER — LEVOFLOXACIN 500 MG PO TABS: 500.0000 mg | ORAL_TABLET | Freq: Every day | ORAL | Status: DC

## 2015-09-19 MED ORDER — PREDNISONE 20 MG PO TABS: ORAL_TABLET | ORAL | Status: DC

## 2015-09-19 NOTE — Progress Notes (Signed)
Subjective:  By signing my name below, I, Essence Howell, attest that this documentation has been prepared under the direction and in the presence of Leandrew Koyanagi, MD Electronically Signed: Ladene Artist, ED Scribe 09/19/2015 at 4:55 PM.   Patient ID: [name], male    DOB: [birth year], 75 y.o.   MRN: [id number]  Chief Complaint  Patient presents with  . Follow-up    sinus congestion, chest congestion, cough  . Sore Throat   HPI HPI Comments: RALPHEAL SURGENER is a 75 y.o. male who presents to the [facility] for a follow-up regarding URI since 08/17/2015. Pt was also seen on 09/04/15 for the same and started on Augmentin with improvement of cough as well as nasal and chest congestion. Today, he reports sore throat that is worsened with swallowing, mild congestion, cough, hoarseness, wheezing only at night, and sharp bilateral ear pain. Pt denies fever, chills, night sweats, hearing loss and SOB.   Pt also reports low platelet counts in September 2016 which is being monitored by his oncology. He reports h/o MI in 2008.  Past Medical History  Diagnosis Date  . Coronary atherosclerosis of native coronary artery   . Hyperlipidemia     with low HDL  . Ankylosing spondylitis ([place])   . Hypertension   . Allergic rhinitis   . Cough   . Abnormal chest x-ray   . Colonic polyp   . Hemorrhoids   . Rotator cuff tear   . Ganglion cyst of wrist     left  . Other disorder of muscle, ligament, and fascia   . Anxiety   . History of malaria   . Arthritis   . Kidney stone   . Heart attack [facility]) 2008   Current Outpatient Prescriptions on File Prior to Visit  Medication Sig Dispense Refill  . amLODipine (NORVASC) 2.5 MG tablet Take 1 tablet (2.5 mg total) by mouth daily. 90 tablet 3  . aspirin 81 MG EC tablet Take 81 mg by mouth at bedtime.     [name] ipratropium (ATROVENT) 0.03 % nasal spray Place 2 sprays into both nostrils 2 (two) times daily. 30 mL 0  . simvastatin  (ZOCOR) 20 MG tablet TAKE 1 TABLET EVERY DAY WITH BREAKFAST 90 tablet 2  . Guaifenesin (MUCINEX MAXIMUM STRENGTH) 1200 MG TB12 Take 1 tablet (1,200 mg total) by mouth every 12 (twelve) hours as needed. (Patient not taking: Reported on 09/04/2015) 14 tablet 1   No current facility-administered medications on file prior to visit.   No Known Allergies  Review of Systems  Constitutional: Negative for fever, chills and diaphoresis.  HENT: Positive for congestion, ear pain, sore throat and voice change. Negative for hearing loss.   Respiratory: Positive for cough and wheezing. Negative for shortness of breath.       Objective:   Physical Exam  Constitutional: He is oriented to person, place, and time. He appears well-developed and well-nourished. No distress.  HENT:  Head: Normocephalic and atraumatic.  TMs clear. Boggy turbs. Throat is slightly red.   Eyes: Conjunctivae and EOM are normal.  Neck: Neck supple.  Cardiovascular: Normal rate.   Pulmonary/Chest: Effort normal. No respiratory distress.  Lungs are clear. Delayed expiration.   Musculoskeletal: Normal range of motion.  Neurological: He is alert and oriented to person, place, and time.  Skin: Skin is warm and dry.  Psychiatric: He has a normal mood and affect. His behavior is normal.  Nursing note and vitals  reviewed.  Results for orders placed or performed in visit on 09/19/15  POCT CBC  Result Value Ref Range   WBC 5.6 4.6 - 10.2 K/uL   Lymph, poc 1.8 0.6 - 3.4   POC LYMPH PERCENT 31.5 10 - 50 %L   MID (cbc) 0.3 0 - 0.9   POC MID % 6.1 0 - 12 %M   POC Granulocyte 3.5 2 - 6.9   Granulocyte percent 62.4 37 - 80 %G   RBC 4.91 4.69 - 6.13 M/uL   Hemoglobin 14.5 14.1 - 18.1 g/dL   HCT, POC 43.4 (A) 43.5 - 53.7 %   MCV 88.4 80 - 97 fL   MCH, POC 29.5 27 - 31.2 pg   MCHC 33.4 31.8 - 35.4 g/dL   RDW, POC 16.3 %   Platelet Count, POC 85 (A) 142 - 424 K/uL   MPV 9.4 0 - 99.8 fL       Assessment & Plan:  Cough  - Sorethroat - Partially treated vs relapsing sinusitis with reactive airway disease  Meds ordered this encounter  Medications  . predniSONE (DELTASONE) 20 MG tablet    Sig: 3/3/2/2/1/1 single daily dose for 6 days    Dispense:  12 tablet    Refill:  0  . levofloxacin (LEVAQUIN) 500 MG tablet    Sig: Take 1 tablet (500 mg total) by mouth daily.    Dispense:  7 tablet    Refill:  0   Fu 3-5 d if not responding hjas [place] 2 weeks I have completed the patient encounter in its entirety as documented by the scribe, with editing by me where necessary. Shakyla Nolley P. Laney Pastor, M.D.

## 2015-10-16 ENCOUNTER — Telehealth: Payer: Self-pay | Admitting: *Deleted

## 2015-10-16 ENCOUNTER — Other Ambulatory Visit (HOSPITAL_BASED_OUTPATIENT_CLINIC_OR_DEPARTMENT_OTHER): Payer: Medicare Other

## 2015-10-16 ENCOUNTER — Encounter: Payer: Self-pay | Admitting: Internal Medicine

## 2015-10-16 ENCOUNTER — Ambulatory Visit (HOSPITAL_COMMUNITY)
Admission: RE | Admit: 2015-10-16 | Discharge: 2015-10-16 | Disposition: A | Discharge status: Home / Self Care | Payer: Medicare Other | Source: Ambulatory Visit | Attending: Internal Medicine | Admitting: Internal Medicine

## 2015-10-16 ENCOUNTER — Telehealth: Payer: Self-pay | Admitting: Internal Medicine

## 2015-10-16 ENCOUNTER — Ambulatory Visit (HOSPITAL_BASED_OUTPATIENT_CLINIC_OR_DEPARTMENT_OTHER): Payer: Medicare Other | Admitting: Internal Medicine

## 2015-10-16 VITALS — BP 135/60 | HR 63 | Temp 98.2°F | Resp 18 | Ht 66.0 in | Wt 190.5 lb

## 2015-10-16 DIAGNOSIS — M7989 Other specified soft tissue disorders: Secondary | ICD-10-CM | POA: Diagnosis not present

## 2015-10-16 DIAGNOSIS — D696 Thrombocytopenia, unspecified: Secondary | ICD-10-CM | POA: Diagnosis not present

## 2015-10-16 HISTORY — DX: Other specified soft tissue disorders: M79.89

## 2015-10-16 LAB — CBC WITH DIFFERENTIAL/PLATELET
BASO%: 0 % (ref 0.0–2.0)
Basophils Absolute: 0 10*3/uL (ref 0.0–0.1)
EOS ABS: 0 10*3/uL (ref 0.0–0.5)
EOS%: 0 % (ref 0.0–7.0)
HEMATOCRIT: 42.7 % (ref 38.4–49.9)
HGB: 14.4 g/dL (ref 13.0–17.1)
LYMPH%: 29.5 % (ref 14.0–49.0)
MCH: 30.4 pg (ref 27.2–33.4)
MCHC: 33.7 g/dL (ref 32.0–36.0)
MCV: 90.3 fL (ref 79.3–98.0)
MONO#: 0.5 10*3/uL (ref 0.1–0.9)
MONO%: 13.4 % (ref 0.0–14.0)
NEUT#: 2.1 10*3/uL (ref 1.5–6.5)
NEUT%: 57.1 % (ref 39.0–75.0)
PLATELETS: 78 10*3/uL — AB (ref 140–400)
RBC: 4.73 10*6/uL (ref 4.20–5.82)
RDW: 15.3 % — ABNORMAL HIGH (ref 11.0–14.6)
WBC: 3.6 10*3/uL — ABNORMAL LOW (ref 4.0–10.3)
lymph#: 1.1 10*3/uL (ref 0.9–3.3)
nRBC: 0 % (ref 0–0)

## 2015-10-16 LAB — LACTATE DEHYDROGENASE: LDH: 103 U/L — ABNORMAL LOW (ref 125–245)

## 2015-10-16 NOTE — Telephone Encounter (Signed)
[name] with vascular lab called reporting patient's "doppler study is completely negative.  He asked if he may walk on the treadmill and stair climber?  Are there any instructions for him."  Verbal order received and read back from Dr. [name] for patient to check with his PCP.  From aspect of doppler he doesn't have a clot so he can do these activities.  He may use ibuprofen to help with his muscles.  Order given to [name] to share with [name] at this time.

## 2015-10-16 NOTE — Progress Notes (Signed)
[facility] [phone]   [phone]  OFFICE PROGRESS NOTE  Garnet Koyanagi, DO [address] [place] [address]  DIAGNOSIS: Thrombocytopenia, most likely idiopathic thrombocytopenic purpura.  PRIOR THERAPY:None.  CURRENT THERAPY: observation.  INTERVAL HISTORY: [name] 75 y.o. male returns to the clinic today for routine six-month followup visit. The patient is feeling fine today with no specific complaints tenderness and swelling of the calf of the right lower extremity. He has been traveling a lot recently including flying to [place] as well as driving to a ski resort in [name]. He denied having any significant bleeding issues, bruises or ecchymosis. The patient has no chest pain, shortness of breath, cough or hemoptysis. He denied having any significant weight loss or night sweats. He has repeat CBC performed earlier today and is here for evaluation and discussion of his lab results.  MEDICAL HISTORY: Past Medical History  Diagnosis Date  . Coronary atherosclerosis of native coronary artery   . Hyperlipidemia     with low HDL  . Ankylosing spondylitis ([place])   . Hypertension   . Allergic rhinitis   . Cough   . Abnormal chest x-ray   . Colonic polyp   . Hemorrhoids   . Rotator cuff tear   . Ganglion cyst of wrist     left  . Other disorder of muscle, ligament, and fascia   . Anxiety   . History of malaria   . Arthritis   . Kidney stone   . Heart attack ([place]) 2008    ALLERGIES:  has No Known Allergies.  MEDICATIONS:  Current Outpatient Prescriptions  Medication Sig Dispense Refill  . amLODipine (NORVASC) 2.5 MG tablet Take 1 tablet (2.5 mg total) by mouth daily. 90 tablet 3  . aspirin 81 MG EC tablet Take 81 mg by mouth at bedtime.     . Guaifenesin (MUCINEX MAXIMUM STRENGTH) 1200 MG TB12 Take 1 tablet (1,200 mg total) by mouth every 12 (twelve) hours as needed. 14 tablet 1  . ipratropium (ATROVENT)  0.03 % nasal spray Place 2 sprays into both nostrils 2 (two) times daily. 30 mL 0  . levofloxacin (LEVAQUIN) 500 MG tablet Take 1 tablet (500 mg total) by mouth daily. 7 tablet 0  . predniSONE (DELTASONE) 20 MG tablet 3/3/2/2/1/1 single daily dose for 6 days 12 tablet 0  . simvastatin (ZOCOR) 20 MG tablet TAKE 1 TABLET EVERY DAY WITH BREAKFAST 90 tablet 2   No current facility-administered medications for this visit.    SURGICAL HISTORY:  Past Surgical History  Procedure Laterality Date  . Rotator cuff repair      bilat by Dr. French Ana  . Hemorrhoid surgery  10/10    in [place]  . Left heart catheterization with coronary angiogram N/A 09/03/2012    Procedure: LEFT HEART CATHETERIZATION WITH CORONARY ANGIOGRAM;  Surgeon: [name], MD;  Location: [facility] CATH LAB;  Service: Cardiovascular;  Laterality: N/A;    REVIEW OF SYSTEMS:  Constitutional: negative Eyes: negative Ears, nose, mouth, throat, and face: negative Respiratory: negative Cardiovascular: negative Gastrointestinal: negative Genitourinary:negative Integument/breast: negative Hematologic/lymphatic: negative Musculoskeletal:negative Neurological: negative Behavioral/Psych: negative Endocrine: negative Allergic/Immunologic: negative   PHYSICAL EXAMINATION: General appearance: alert, cooperative and no distress Head: Normocephalic, without obvious abnormality, atraumatic Neck: no adenopathy Lymph nodes: Cervical, supraclavicular, and axillary nodes normal. Resp: clear to auscultation bilaterally Back: symmetric, no curvature. ROM normal. No CVA tenderness. Cardio: regular rate and rhythm, S1, S2 normal,  no murmur, click, rub or gallop GI: soft, non-tender; bowel sounds normal; no masses,  no organomegaly Extremities: edema And tenderness of the right calf Neurologic: Alert and oriented X 3, normal strength and tone. Normal symmetric reflexes. Normal coordination and gait  ECOG PERFORMANCE STATUS: 0 - Asymptomatic  Blood  pressure 135/60, pulse 63, temperature 98.2 F (36.8 C), temperature source Oral, resp. rate 18, height 5\' 6"  (1.676 m), weight 190 lb 8 oz (86.41 kg), SpO2 100 %.  LABORATORY DATA: Lab Results  Component Value Date   WBC 3.6* 10/16/2015   HGB 14.4 10/16/2015   HCT 42.7 10/16/2015   MCV 90.3 10/16/2015   PLT 78* 10/16/2015      Chemistry      Component Value Date/Time   NA 142 04/18/2015 0816   NA 140 09/21/2014 0839   K 4.2 04/18/2015 0816   K 4.2 09/21/2014 0839   CL 105 09/21/2014 0839   CL 106 09/30/2012 0949   CO2 27 04/18/2015 0816   CO2 29 09/21/2014 0839   BUN 19.1 04/18/2015 0816   BUN 19 09/21/2014 0839   CREATININE 0.9 04/18/2015 0816   CREATININE 0.88 09/21/2014 0839      Component Value Date/Time   CALCIUM 9.1 04/18/2015 0816   CALCIUM 9.5 09/21/2014 0839   ALKPHOS 52 04/18/2015 0816   ALKPHOS 53 08/03/2012 1019   AST 21 04/18/2015 0816   AST 42* 08/03/2012 1019   ALT 29 04/18/2015 0816   ALT 62* 08/03/2012 1019   BILITOT 0.76 04/18/2015 0816   BILITOT 1.1 08/03/2012 1019     BONE MARROW REPORT FINAL DIAGNOSIS RADIOGRAPHIC STUDIES: No results found.  ASSESSMENT AND PLAN: This is a very pleasant 75 years old white male with thrombocytopenia most likely idiopathic thrombocytopenic purpura.  His platelets count are low at 78,000 but the patient continues to be asymptomatic.   I recommended for him to continue on observation for now as he is currently asymptomatic. I would see him back for followup visit in 6 months with repeat CBC and LDH.  For the swelling and tenderness of the right calf, I will order ultrasound Doppler of the lower extremities to rule out deep venous thrombosis. I advised the patient to call me immediately if he has any significant bleeding issues, bruises or ecchymosis or if his platelets count to be less than 50,000.  All questions were answered. The patient knows to call the clinic with any problems, questions or concerns. We can  certainly see the patient much sooner if necessary.  Disclaimer: This note was dictated with voice recognition software. Similar sounding words can inadvertently be transcribed and may not be corrected upon review.

## 2015-10-16 NOTE — Progress Notes (Signed)
VASCULAR LAB PRELIMINARY  PRELIMINARY  PRELIMINARY  PRELIMINARY  Right lower extremity venous duplex completed.    Preliminary report:  Right:  No evidence of DVT, superficial thrombosis, or Baker's cyst.  [name], RVS 10/16/2015, 10:09 AM

## 2015-10-16 NOTE — Telephone Encounter (Signed)
Gave and printed appt sched and avs for pt for Sept °

## 2015-11-21 ENCOUNTER — Ambulatory Visit (INDEPENDENT_AMBULATORY_CARE_PROVIDER_SITE_OTHER): Payer: Medicare Other | Admitting: Internal Medicine

## 2015-11-21 ENCOUNTER — Ambulatory Visit (INDEPENDENT_AMBULATORY_CARE_PROVIDER_SITE_OTHER): Payer: Medicare Other

## 2015-11-21 VITALS — BP 120/66 | HR 77 | Temp 98.4°F | Resp 16 | Ht 66.0 in | Wt 192.8 lb

## 2015-11-21 DIAGNOSIS — R062 Wheezing: Secondary | ICD-10-CM

## 2015-11-21 DIAGNOSIS — R0981 Nasal congestion: Secondary | ICD-10-CM | POA: Diagnosis not present

## 2015-11-21 DIAGNOSIS — R05 Cough: Secondary | ICD-10-CM

## 2015-11-21 DIAGNOSIS — R059 Cough, unspecified: Secondary | ICD-10-CM

## 2015-11-21 LAB — POCT INFLUENZA A/B
Influenza A, POC: NEGATIVE
Influenza B, POC: NEGATIVE

## 2015-11-21 MED ORDER — HYDROCODONE-HOMATROPINE 5-1.5 MG/5ML PO SYRP: 5.0000 mL | ORAL_SOLUTION | Freq: Four times a day (QID) | ORAL | Status: DC | PRN

## 2015-11-21 MED ORDER — AZITHROMYCIN 500 MG PO TABS: 500.0000 mg | ORAL_TABLET | Freq: Every day | ORAL | Status: DC

## 2015-11-21 NOTE — Patient Instructions (Signed)
     IF you received an x-ray today, you will receive an invoice from [facility]. Please contact  Radiology at [phone] with questions or concerns regarding your invoice.   IF you received labwork today, you will receive an invoice from [facility]. Please contact [facility] at [phone] with questions or concerns regarding your invoice.   Our billing staff will not be able to assist you with questions regarding bills from these companies.  You will be contacted with the lab results as soon as they are available. The fastest way to get your results is to activate your My Chart account. Instructions are located on the last page of this paperwork. If you have not heard from us regarding the results in 2 weeks, please contact this office.      

## 2015-11-21 NOTE — Progress Notes (Addendum)
°  By signing my name below I, Tereasa Coop, attest that this documentation has been prepared under the direction and in the presence of Tami Lin. MD. Electonically Signed. Tereasa Coop, Scribe 11/21/2015 at 10:24 AM  Subjective:    Patient ID: [name], male    DOB: [birth year], 75 y.o.   MRN: [id number] Chief Complaint  Patient presents with   Nasal Congestion    x 2 days   also having chest congestion   Cough    HPI [name] is a 75 y.o. male who presents to the [facility] complaining of cough and nasal drainage for the past 2 days. Pt states cough is starting to hurt. Pt states that his nasal drainage is running clear. Pt also reports wheezing and mild difficulty breathing. Pt denies any chills. Pt states he had a recent airplane trip and sat near a coughing child. Pt states that he did get the flu shot this year.    Review of Systems  Constitutional: Negative for chills.  HENT: Positive for congestion.   Respiratory: Positive for cough and wheezing.        Objective:   Physical Exam  Constitutional: He is oriented to person, place, and time. He appears well-developed and well-nourished.  HENT:  Head: Normocephalic and atraumatic.  Clear nasal congestion  Eyes: EOM are normal. Pupils are equal, round, and reactive to light.  Cardiovascular: Normal rate and regular rhythm.   No murmur heard. Pulmonary/Chest:  Scattered rhonchi bilat  Neurological: He is alert and oriented to person, place, and time.  Psychiatric: He has a normal mood and affect. His behavior is normal.   Filed Vitals:   11/21/15 0916  BP: 120/66  Pulse: 77  Temp: 98.4 F (36.9 C)  Resp: 16    Results for orders placed or performed in visit on 11/21/15  POCT Influenza A/B  Result Value Ref Range   Influenza A, POC Negative Negative   Influenza B, POC Negative Negative   [name] 2 View  11/21/2015  CLINICAL DATA:  Cough and congestion EXAM: CHEST  2  VIEW COMPARISON:  12/24/2014 FINDINGS: Elevated right hemidiaphragm unchanged with right lower lobe atelectasis. Mild cardiac enlargement. Negative for heart failure. Left lung is clear. Negative for mass lesion. IMPRESSION: Chronic elevation right hemidiaphragm.  No acute abnormality. Electronically Signed   By: [name] M.D.   On: 11/21/2015 10:14         Assessment & Plan:  I have completed the patient encounter in its entirety as documented by the scribe, with editing by me where necessary. Robert P. Laney Pastor, M.D.  Cough - Plan: POCT Influenza A/B, [name] 2 View LRI with airline travel  Meds ordered this encounter  Medications   HYDROcodone-homatropine (HYCODAN) 5-1.5 MG/5ML syrup    Sig: Take 5 mLs by mouth every 6 (six) hours as needed.    Dispense:  120 mL    Refill:  0   azithromycin (ZITHROMAX) 500 MG tablet    Sig: Take 1 tablet (500 mg total) by mouth daily. For 3 days    Dispense:  3 tablet    Refill:  0  covering mycoplasma

## 2015-11-24 ENCOUNTER — Ambulatory Visit (INDEPENDENT_AMBULATORY_CARE_PROVIDER_SITE_OTHER): Payer: Medicare Other

## 2015-11-24 ENCOUNTER — Ambulatory Visit (INDEPENDENT_AMBULATORY_CARE_PROVIDER_SITE_OTHER): Payer: Medicare Other | Admitting: Emergency Medicine

## 2015-11-24 VITALS — BP 130/64 | HR 76 | Temp 98.2°F | Resp 16 | Ht 66.5 in | Wt 193.4 lb

## 2015-11-24 DIAGNOSIS — R059 Cough, unspecified: Secondary | ICD-10-CM

## 2015-11-24 DIAGNOSIS — R05 Cough: Secondary | ICD-10-CM

## 2015-11-24 DIAGNOSIS — R062 Wheezing: Secondary | ICD-10-CM

## 2015-11-24 LAB — POCT CBC
GRANULOCYTE PERCENT: 29.6 % — AB (ref 37–80)
HEMATOCRIT: 42.2 % — AB (ref 43.5–53.7)
HEMOGLOBIN: 14.6 g/dL (ref 14.1–18.1)
Lymph, poc: 2.4 (ref 0.6–3.4)
MCH: 30.6 pg (ref 27–31.2)
MCHC: 34.7 g/dL (ref 31.8–35.4)
MCV: 88.4 fL (ref 80–97)
MID (cbc): 0.7 (ref 0–0.9)
POC GRANULOCYTE: 1.3 — AB (ref 2–6.9)
POC LYMPH %: 54 % — AB (ref 10–50)
POC MID %: 16.4 %M — AB (ref 0–12)
Platelet Count, POC: 57 10*3/uL — AB (ref 142–424)
RBC: 4.78 M/uL (ref 4.69–6.13)
RDW, POC: 16 %
WBC: 4.5 10*3/uL — AB (ref 4.6–10.2)

## 2015-11-24 LAB — POCT INFLUENZA A/B
INFLUENZA A, POC: NEGATIVE
Influenza B, POC: NEGATIVE

## 2015-11-24 MED ORDER — ALBUTEROL SULFATE HFA 108 (90 BASE) MCG/ACT IN AERS: 2.0000 | INHALATION_SPRAY | RESPIRATORY_TRACT | Status: DC | PRN

## 2015-11-24 MED ORDER — ALBUTEROL SULFATE (2.5 MG/3ML) 0.083% IN NEBU
2.5000 mg | INHALATION_SOLUTION | Freq: Once | RESPIRATORY_TRACT | Status: AC
  Administered 2015-11-24: 2.5 mg via RESPIRATORY_TRACT

## 2015-11-24 MED ORDER — PREDNISONE 10 MG PO TABS: ORAL_TABLET | ORAL | Status: DC

## 2015-11-24 MED ORDER — IPRATROPIUM BROMIDE 0.02 % IN SOLN
0.5000 mg | Freq: Once | RESPIRATORY_TRACT | Status: AC
  Administered 2015-11-24: 0.5 mg via RESPIRATORY_TRACT

## 2015-11-24 MED ORDER — DOXYCYCLINE HYCLATE 100 MG PO TABS: 100.0000 mg | ORAL_TABLET | Freq: Two times a day (BID) | ORAL | Status: DC

## 2015-11-24 NOTE — Patient Instructions (Addendum)
   IF you received an x-ray today, you will receive an invoice from [facility]. Please contact [facility] at [phone] with questions or concerns regarding your invoice.   IF you received labwork today, you will receive an invoice from [facility]. Please contact [facility] at [phone] with questions or concerns regarding your invoice.   Our billing staff will not be able to assist you with questions regarding bills from these companies.  You will be contacted with the lab results as soon as they are available. The fastest way to get your results is to activate your My Chart account. Instructions are located on the last page of this paperwork. If you have not heard from us regarding the results in 2 weeks, please contact this office.      Community-Acquired Pneumonia, Adult Pneumonia is an infection of the lungs. There are different types of pneumonia. One type can develop while a person is in a hospital. A different type, called community-acquired pneumonia, develops in people who are not, or have not recently been, in the hospital or other health care facility.  CAUSES Pneumonia may be caused by bacteria, viruses, or funguses. Community-acquired pneumonia is often caused by Streptococcus pneumonia bacteria. These bacteria are often passed from one person to another by breathing in droplets from the cough or sneeze of an infected person. RISK FACTORS The condition is more likely to develop in:  People who havechronic diseases, such as chronic obstructive pulmonary disease (COPD), asthma, congestive heart failure, cystic fibrosis, diabetes, or kidney disease.  People who haveearly-stage or late-stage HIV.  People who havesickle cell disease.  People who havehad their spleen removed (splenectomy).  People who havepoor dental hygiene.  People who havemedical conditions that increase the risk of breathing in (aspirating)  secretions their own mouth and nose.   People who havea weakened immune system (immunocompromised).  People who smoke.  People whotravel to areas where pneumonia-causing germs commonly exist.  People whoare around animal habitats or animals that have pneumonia-causing germs, including birds, bats, rabbits, cats, and farm animals. SYMPTOMS Symptoms of this condition include:  Adry cough.  A wet (productive) cough.  Fever.  Sweating.  Chest pain, especially when breathing deeply or coughing.  Rapid breathing or difficulty breathing.  Shortness of breath.  Shaking chills.  Fatigue.  Muscle aches. DIAGNOSIS Your health care provider will take a medical history and perform a physical exam. You may also have other tests, including:  Imaging studies of your chest, including X-rays.  Tests to check your blood oxygen level and other blood gases.  Other tests on blood, mucus (sputum), fluid around your lungs (pleural fluid), and urine. If your pneumonia is severe, other tests may be done to identify the specific cause of your illness. TREATMENT The type of treatment that you receive depends on many factors, such as the cause of your pneumonia, the medicines you take, and other medical conditions that you have. For most adults, treatment and recovery from pneumonia may occur at home. In some cases, treatment must happen in a hospital. Treatment may include:  Antibiotic medicines, if the pneumonia was caused by bacteria.  Antiviral medicines, if the pneumonia was caused by a virus.  Medicines that are given by mouth or through an IV tube.  Oxygen.  Respiratory therapy. Although rare, treating severe pneumonia may include:  Mechanical ventilation. This is done if you are not breathing well on your own and you cannot maintain a safe blood oxygen level.    Thoracentesis. This procedureremoves fluid around one lung or both lungs to help you breathe better. HOME CARE  INSTRUCTIONS  Take over-the-counter and prescription medicines only as told by your health care provider.  Only takecough medicine if you are losing sleep. Understand that cough medicine can prevent your body's natural ability to remove mucus from your lungs.  If you were prescribed an antibiotic medicine, take it as told by your health care provider. Do not stop taking the antibiotic even if you start to feel better.  Sleep in a semi-upright position at night. Try sleeping in a reclining chair, or place a few pillows under your head.  Do not use tobacco products, including cigarettes, chewing tobacco, and e-cigarettes. If you need help quitting, ask your health care provider.  Drink enough water to keep your urine clear or pale yellow. This will help to thin out mucus secretions in your lungs. PREVENTION There are ways that you can decrease your risk of developing community-acquired pneumonia. Consider getting a pneumococcal vaccine if:  You are older than 73 years of age.  You are older than 73 years of age and are undergoing cancer treatment, have chronic lung disease, or have other medical conditions that affect your immune system. Ask your health care provider if this applies to you. There are different types and schedules of pneumococcal vaccines. Ask your health care provider which vaccination option is best for you. You may also prevent community-acquired pneumonia if you take these actions:  Get an influenza vaccine every year. Ask your health care provider which type of influenza vaccine is best for you.  Go to the dentist on a regular basis.  Wash your hands often. Use hand sanitizer if soap and water are not available. SEEK MEDICAL CARE IF:  You have a fever.  You are losing sleep because you cannot control your cough with cough medicine. SEEK IMMEDIATE MEDICAL CARE IF:  You have worsening shortness of breath.  You have increased chest pain.  Your sickness becomes  worse, especially if you are an older adult or have a weakened immune system.  You cough up blood.   This information is not intended to replace advice given to you by your health care provider. Make sure you discuss any questions you have with your health care provider.   Document Released: 07/29/2005 Document Revised: 04/19/2015 Document Reviewed: 11/23/2014 Elsevier Interactive Patient Education 2016 [employer]  

## 2015-11-24 NOTE — Progress Notes (Addendum)
By signing my name below, I, [name], attest that this documentation has been prepared under the direction and in the presence of [name], MD.  Electronically Signed: Verlee Monte, Medical Scribe. 11/24/2015. 9:45 AM.  Chief Complaint:  Chief Complaint  Patient presents with  . Cough    "worse" , per pt" have to sleep sitting up"    HPI: [name] is a 75 y.o. male who reports to [facility] today complaining of productive cough that produces clear tan sputum. Pt reports associated symptoms of congestion, fevers, chills, myalgia, wheezing starting last night, and swelling in his neck. He thinks he picked something up from [place]. Pt reports frequent traveling. He reports taking a three day series of Abx for the last time he had this cough. Pt doesn't think there are any sick contact at home. Pt mentions he took medication that could be causing phlegm build up 6 months ago. Pt denies pollen triggering asthma. Pt report this is his third time coming in for cough.  Pt reports having low plate count. He says it hasn't changed dramatically over the years.  Saw Dr. Jennell Corner 11/21/15 was treated with cough medication and Abx. His flu test was negative. His chest x-ray had no acute changes.  Past Medical History  Diagnosis Date  . Coronary atherosclerosis of native coronary artery   . Hyperlipidemia     with low HDL  . Ankylosing spondylitis ([place])   . Hypertension   . Allergic rhinitis   . Cough   . Abnormal chest x-ray   . Colonic polyp   . Hemorrhoids   . Rotator cuff tear   . Ganglion cyst of wrist     left  . Other disorder of muscle, ligament, and fascia   . Anxiety   . History of malaria   . Arthritis   . Kidney stone   . Heart attack ([place]) 2008  . Swelling of lower extremity 10/16/2015   Past Surgical History  Procedure Laterality Date  . Rotator cuff repair      bilat by Dr. French Ana  . Hemorrhoid surgery  10/10    in [place]  . Left heart catheterization with  coronary angiogram N/A 09/03/2012    Procedure: LEFT HEART CATHETERIZATION WITH CORONARY ANGIOGRAM;  Surgeon: [name], MD;  Location: [facility] CATH LAB;  Service: Cardiovascular;  Laterality: N/A;   Social History   Social History  . Marital Status: Married    Spouse Name: [name]  . Number of Children: 2  . Years of Education: N/A   Social History Main Topics  . Smoking status: Never Smoker   . Smokeless tobacco: Never Used  . Alcohol Use: No     Comment: occ 1 beer, wine occ  . Drug Use: No  . Sexual Activity: Yes   Other Topics Concern  . None   Social History Narrative   Family History  Problem Relation Age of Onset  . Cancer Mother     melanoma  . Emphysema Mother   . Rheum arthritis Mother   . Diabetes Mother    No Known Allergies Prior to Admission medications   Medication Sig Start Date End Date Taking? Authorizing Provider  amLODipine (NORVASC) 2.5 MG tablet Take 1 tablet (2.5 mg total) by mouth daily. 12/26/14  Yes [name], MD  aspirin 81 MG EC tablet Take 81 mg by mouth at bedtime.    Yes Historical Provider, MD  HYDROcodone-homatropine (HYCODAN) 5-1.5 MG/5ML syrup Take 5 mLs  by mouth every 6 (six) hours as needed. 11/21/15  Yes Leandrew Koyanagi, MD  simvastatin (ZOCOR) 20 MG tablet TAKE 1 TABLET EVERY DAY WITH BREAKFAST 06/08/15  Yes [name], MD     ROS: The patient denies night sweats, unintentional weight loss, chest pain, palpitations, dyspnea on exertion, nausea, vomiting, abdominal pain, dysuria, hematuria, melena, numbness, weakness, or tingling.  All other systems have been reviewed and were otherwise negative with the exception of those mentioned in the HPI and as above.    PHYSICAL EXAM: Filed Vitals:   11/24/15 0827  BP: 130/64  Pulse: 76  Temp: 98.2 F (36.8 C)  Resp: 16   Body mass index is 30.75 kg/(m^2).   General: Alert, no acute distress HEENT:  Normocephalic, atraumatic, oropharynx patent. Nasal congestion. Uvula is  prominate. Eye: [name] [facility] Cardiovascular:  Regular rate and rhythm, no rubs murmurs or gallops.  No Carotid bruits, radial pulse intact. No pedal edema.  Respiratory: Clear to auscultation bilaterally.  No rales, or rhonchi.  No cyanosis, no use of accessory musculature. Chest bilateral wheezes symmetrical for breath sounds. Abdominal: No organomegaly, abdomen is soft and non-tender, positive bowel sounds.  No masses. Musculoskeletal: Gait intact. No edema, tenderness Skin: No rashes. Neurologic: Facial musculature symmetric. Psychiatric: Patient acts appropriately throughout our interaction. Lymphatic: No cervical or submandibular lymphadenopathy   LABS: Results for orders placed or performed in visit on 11/24/15  POCT CBC  Result Value Ref Range   WBC 4.5 (A) 4.6 - 10.2 K/uL   Lymph, poc 2.4 0.6 - 3.4   POC LYMPH PERCENT 54.0 (A) 10 - 50 %L   MID (cbc) 0.7 0 - 0.9   POC MID % 16.4 (A) 0 - 12 %M   POC Granulocyte 1.3 (A) 2 - 6.9   Granulocyte percent 29.6 (A) 37 - 80 %G   RBC 4.78 4.69 - 6.13 M/uL   Hemoglobin 14.6 14.1 - 18.1 g/dL   HCT, POC 42.2 (A) 43.5 - 53.7 %   MCV 88.4 80 - 97 fL   MCH, POC 30.6 27 - 31.2 pg   MCHC 34.7 31.8 - 35.4 g/dL   RDW, POC 16.0 %   Platelet Count, POC 57 (A) 142 - 424 K/uL   MPV  0 - 99.8 fL   Meds ordered this encounter  Medications  . albuterol (PROVENTIL) (2.5 MG/3ML) 0.083% nebulizer solution 2.5 mg    Sig:   . ipratropium (ATROVENT) nebulizer solution 0.5 mg    Sig:   . albuterol (PROVENTIL) (2.5 MG/3ML) 0.083% nebulizer solution 2.5 mg    Sig:   . predniSONE (DELTASONE) 10 MG tablet    Sig: Take 4 a day for 3 days 3 a day for 3 days 2 a day for 3 days one a day for 3 days    Dispense:  30 tablet    Refill:  0  . doxycycline (VIBRA-TABS) 100 MG tablet    Sig: Take 1 tablet (100 mg total) by mouth 2 (two) times daily.    Dispense:  20 tablet    Refill:  0  . albuterol (PROVENTIL HFA;VENTOLIN HFA) 108 (90 Base) MCG/ACT inhaler      Sig: Inhale 2 puffs into the lungs every 4 (four) hours as needed for wheezing or shortness of breath (cough, shortness of breath or wheezing.).    Dispense:  1 Inhaler    Refill:  3   EKG/XRAY:   Primary read interpreted by Dr. Everlene Farrier at [facility]. [name]  2 View  11/24/2015  CLINICAL DATA:  Cough and wheezing. EXAM: CHEST  2 VIEW COMPARISON:  11/21/2015.  12/24/2014 FINDINGS: Mediastinum and hilar structures normal. Heart size stable. Minimal infiltrate in the left lung base cannot be excluded. No trauma pleural effusion. No pneumothorax. Stable elevation right hemidiaphragm. Degenerative changes thoracic spine. Interposition of colon under the right hemidiaphragm again noted. IMPRESSION: 1. Minimal infiltrate left lung base cannot be excluded. 2.  Chronic elevation right hemidiaphragm . Electronically Signed   By: [name]  Register   On: 11/24/2015 11:05    ASSESSMENT/PLAN: Patient improved after nebulizer treatment. White count is low with lymphocytosis consistent with a viral etiology. He has just completed a Z-Pak. He did improve with nebulizer treatment. Peak flow improved with this. He will be on Mucinex twice a day he is given a short prednisone taper. He will be on doxycycline for 10 days. He will have an albuterol inhaler 2 puffs every 4 hours for bronchospasm.I personally performed the services described in this documentation, which was scribed in my presence. The recorded information has been reviewed and is accurate. Patient advised if he developed respiratory status the stress he needed to call 911 rather than stay at home patient's oxygen level drops to 92 with walking. His baseline is 95. Platelet count is low and 57,000. He has had a history of thrombocytopenia for years. Patient to recheck in the morning. He is very aware if he has trouble tonight like he had last night with shortness of breath and difficulty breathing he knows to call 911 instead of waiting.   Gross sideeffects, risk  and benefits, and alternatives of medications d/w patient. Patient is aware that all medications have potential sideeffects and we are unable to predict every sideeffect or drug-drug interaction that may occur.  [name] MD 11/24/2015 9:45 AM

## 2015-11-25 ENCOUNTER — Telehealth: Payer: Self-pay

## 2015-11-25 ENCOUNTER — Ambulatory Visit (INDEPENDENT_AMBULATORY_CARE_PROVIDER_SITE_OTHER): Payer: Medicare Other | Admitting: Emergency Medicine

## 2015-11-25 VITALS — BP 120/74 | HR 72 | Temp 97.5°F | Resp 17 | Wt 191.0 lb

## 2015-11-25 DIAGNOSIS — J189 Pneumonia, unspecified organism: Secondary | ICD-10-CM | POA: Diagnosis not present

## 2015-11-25 DIAGNOSIS — R062 Wheezing: Secondary | ICD-10-CM

## 2015-11-25 DIAGNOSIS — D696 Thrombocytopenia, unspecified: Secondary | ICD-10-CM

## 2015-11-25 LAB — POCT CBC
Granulocyte percent: 63.2 %G (ref 37–80)
HCT, POC: 39.7 % — AB (ref 43.5–53.7)
Hemoglobin: 14.2 g/dL (ref 14.1–18.1)
Lymph, poc: 1.1 (ref 0.6–3.4)
MCH, POC: 31.4 pg — AB (ref 27–31.2)
MCHC: 35.7 g/dL — AB (ref 31.8–35.4)
MCV: 87.9 fL (ref 80–97)
MID (CBC): 0.4 (ref 0–0.9)
MPV: 9.1 fL (ref 0–99.8)
PLATELET COUNT, POC: 58 10*3/uL — AB (ref 142–424)
POC Granulocyte: 2.7 (ref 2–6.9)
POC LYMPH %: 26.5 % (ref 10–50)
POC MID %: 10.3 % (ref 0–12)
RBC: 4.52 M/uL — AB (ref 4.69–6.13)
RDW, POC: 15.9 %
WBC: 4.3 10*3/uL — AB (ref 4.6–10.2)

## 2015-11-25 LAB — D-DIMER, QUANTITATIVE: D-Dimer, Quant: 0.34 ug/mL-FEU (ref 0.00–0.48)

## 2015-11-25 MED ORDER — ALBUTEROL SULFATE (2.5 MG/3ML) 0.083% IN NEBU
2.5000 mg | INHALATION_SOLUTION | Freq: Once | RESPIRATORY_TRACT | Status: AC
  Administered 2015-11-25: 2.5 mg via RESPIRATORY_TRACT

## 2015-11-25 MED ORDER — IPRATROPIUM BROMIDE 0.02 % IN SOLN
0.5000 mg | Freq: Once | RESPIRATORY_TRACT | Status: AC
  Administered 2015-11-25: 0.5 mg via RESPIRATORY_TRACT

## 2015-11-25 NOTE — Progress Notes (Signed)
By signing my name below, I, [name], attest that this documentation has been prepared under the direction and in the presence of [name], MD.  Electronically Signed: [name], ED Scribe. 11/20/2015. 8:13 AM.  Chief Complaint:  Chief Complaint  Patient presents with  . Follow-up    Cough   HPI: [name] is a 75 y.o. male who reports to [facility] today for a follow up. Pt was seen here yesterday with productive cough, wheezing and SOB. CXR showed minimal infiltrate at the base of left lung. Pt reports improvement with sinus congestion, sinus pressure, and chest congestion. Pt still has productive cough but mucous is not as thick. He still has some wheeze but has improved since yesterday. He was unable to sleep laying down flat and found it to be more comfortable to sleep sitting up in a recliner. He has used the inhalers but has not had relief and believes nebulizer treatment was more effective.  He denies fever, chills and CP.  Past Medical History  Diagnosis Date  . Coronary atherosclerosis of native coronary artery   . Hyperlipidemia     with low HDL  . Ankylosing spondylitis ([place])   . Hypertension   . Allergic rhinitis   . Cough   . Abnormal chest x-ray   . Colonic polyp   . Hemorrhoids   . Rotator cuff tear   . Ganglion cyst of wrist     left  . Other disorder of muscle, ligament, and fascia   . Anxiety   . History of malaria   . Arthritis   . Kidney stone   . Heart attack ([place]) 2008  . Swelling of lower extremity 10/16/2015   Past Surgical History  Procedure Laterality Date  . Rotator cuff repair      bilat by Dr. French Ana  . Hemorrhoid surgery  10/10    in [place]  . Left heart catheterization with coronary angiogram N/A 09/03/2012    Procedure: LEFT HEART CATHETERIZATION WITH CORONARY ANGIOGRAM;  Surgeon: [name], MD;  [facility] CATH LAB;  Service: Cardiovascular;  Laterality: N/A;   Social History   Social History  . Marital Status: Married   Spouse Name: [name]  . Number of Children: 2  . Years of Education: N/A   Social History Main Topics  . Smoking status: Never Smoker   . Smokeless tobacco: Never Used  . Alcohol Use: No     Comment: occ 1 beer, wine occ  . Drug Use: No  . Sexual Activity: Yes   Other Topics Concern  . Not on file   Social History Narrative   Family History  Problem Relation Age of Onset  . Cancer Mother     melanoma  . Emphysema Mother   . Rheum arthritis Mother   . Diabetes Mother    No Known Allergies Prior to Admission medications   Medication Sig Start Date End Date Taking? Authorizing Provider  albuterol (PROVENTIL HFA;VENTOLIN HFA) 108 (90 Base) MCG/ACT inhaler Inhale 2 puffs into the lungs every 4 (four) hours as needed for wheezing or shortness of breath (cough, shortness of breath or wheezing.). 11/24/15   [name], MD  amLODipine (NORVASC) 2.5 MG tablet Take 1 tablet (2.5 mg total) by mouth daily. 12/26/14   [name], MD  aspirin 81 MG EC tablet Take 81 mg by mouth at bedtime.     Historical Provider, MD  doxycycline (VIBRA-TABS) 100 MG tablet Take 1 tablet (100 mg total)  by mouth 2 (two) times daily. 11/24/15   [name], MD  HYDROcodone-homatropine [facility]) 5-1.5 MG/5ML syrup Take 5 mLs by mouth every 6 (six) hours as needed. 11/21/15   Leandrew Koyanagi, MD  predniSONE (DELTASONE) 10 MG tablet Take 4 a day for 3 days 3 a day for 3 days 2 a day for 3 days one a day for 3 days 11/24/15   [name], MD  simvastatin (ZOCOR) 20 MG tablet TAKE 1 TABLET EVERY DAY WITH BREAKFAST 06/08/15   [name], MD     ROS: The patient denies fevers, chills, night sweats, unintentional weight loss, chest pain, palpitations,  dyspnea on exertion, nausea, vomiting, abdominal pain, dysuria, hematuria, melena, numbness, weakness, or tingling.   All other systems have been reviewed and were otherwise negative with the exception of those mentioned in the HPI and as above.    PHYSICAL  EXAM:  Filed Vitals:   11/25/15 0815  BP: 120/74  Pulse: 72  Temp: 97.5 F (36.4 C)  TempSrc: Oral  Resp: 17  Weight: 191 lb (86.637 kg)  SpO2: 93%   Body mass index is 30.37 kg/(m^2).   General: Alert, no acute distress HEENT:  Normocephalic, atraumatic, oropharynx patent. No nasal congestion.  Eye: [name] [facility] Cardiovascular:  Regular rate and rhythm, no rubs murmurs or gallops.  No Carotid bruits, radial pulse intact. No pedal edema.  Respiratory: Clear to auscultation bilaterally.  No cyanosis, no use of accessory musculature. Bilateral inspiratory and expiratory wheeze. Breath sounds symmetrical.  Abdominal: No organomegaly, abdomen is soft and non-tender, positive bowel sounds.  No masses. Musculoskeletal: Gait intact. No edema, tenderness Skin: No rashes. Neurologic: Facial musculature symmetric. Psychiatric: Patient acts appropriately throughout our interaction. Lymphatic: No cervical or submandibular lymphadenopathy  Results for orders placed or performed in visit on 11/25/15  POCT CBC  Result Value Ref Range   WBC 4.3 (A) 4.6 - 10.2 K/uL   Lymph, poc 1.1 0.6 - 3.4   POC LYMPH PERCENT 26.5 10 - 50 %L   MID (cbc) 0.4 0 - 0.9   POC MID % 10.3 0 - 12 %M   POC Granulocyte 2.7 2 - 6.9   Granulocyte percent 63.2 37 - 80 %G   RBC 4.52 (A) 4.69 - 6.13 M/uL   Hemoglobin 14.2 14.1 - 18.1 g/dL   HCT, POC 39.7 (A) 43.5 - 53.7 %   MCV 87.9 80 - 97 fL   MCH, POC 31.4 (A) 27 - 31.2 pg   MCHC 35.7 (A) 31.8 - 35.4 g/dL   RDW, POC 15.9 %   Platelet Count, POC 58 (A) 142 - 424 K/uL   MPV 9.1 0 - 99.8 fL   ASSESSMENT/PLAN: Stat d-dimer done. Will stay the course. His oxygen level fluctuates between 93 and 95 with ambulation. No changes at the present time. Peak flow done ranges between 300 and 350. Recheck on Monday if not showing significant improvement. If his d-dimer is elevated he will have to go to the ER. Stat d-dimer which was done was normal.I personally performed the  services described in this documentation, which was scribed in my presence. The recorded information has been reviewed and is accurate.   Gross sideeffects, risk and benefits, and alternatives of medications d/w patient. Patient is aware that all medications have potential sideeffects and we are unable to predict every sideeffect or drug-drug interaction that may occur.  [name] MD 11/25/2015 8:13 AM

## 2015-11-25 NOTE — Patient Instructions (Addendum)
   IF you received an x-ray today, you will receive an invoice from [facility]. Please contact [facility] at [phone] with questions or concerns regarding your invoice.   IF you received labwork today, you will receive an invoice from [facility]. Please contact [facility] at [phone] with questions or concerns regarding your invoice.   Our billing staff will not be able to assist you with questions regarding bills from these companies.  You will be contacted with the lab results as soon as they are available. The fastest way to get your results is to activate your My Chart account. Instructions are located on the last page of this paperwork. If you have not heard from us regarding the results in 2 weeks, please contact this office.      Community-Acquired Pneumonia, Adult Pneumonia is an infection of the lungs. There are different types of pneumonia. One type can develop while a person is in a hospital. A different type, called community-acquired pneumonia, develops in people who are not, or have not recently been, in the hospital or other health care facility.  CAUSES Pneumonia may be caused by bacteria, viruses, or funguses. Community-acquired pneumonia is often caused by Streptococcus pneumonia bacteria. These bacteria are often passed from one person to another by breathing in droplets from the cough or sneeze of an infected person. RISK FACTORS The condition is more likely to develop in:  People who havechronic diseases, such as chronic obstructive pulmonary disease (COPD), asthma, congestive heart failure, cystic fibrosis, diabetes, or kidney disease.  People who haveearly-stage or late-stage HIV.  People who havesickle cell disease.  People who havehad their spleen removed (splenectomy).  People who havepoor dental hygiene.  People who havemedical conditions that increase the risk of breathing in (aspirating)  secretions their own mouth and nose.   People who havea weakened immune system (immunocompromised).  People who smoke.  People whotravel to areas where pneumonia-causing germs commonly exist.  People whoare around animal habitats or animals that have pneumonia-causing germs, including birds, bats, rabbits, cats, and farm animals. SYMPTOMS Symptoms of this condition include:  Adry cough.  A wet (productive) cough.  Fever.  Sweating.  Chest pain, especially when breathing deeply or coughing.  Rapid breathing or difficulty breathing.  Shortness of breath.  Shaking chills.  Fatigue.  Muscle aches. DIAGNOSIS Your health care provider will take a medical history and perform a physical exam. You may also have other tests, including:  Imaging studies of your chest, including X-rays.  Tests to check your blood oxygen level and other blood gases.  Other tests on blood, mucus (sputum), fluid around your lungs (pleural fluid), and urine. If your pneumonia is severe, other tests may be done to identify the specific cause of your illness. TREATMENT The type of treatment that you receive depends on many factors, such as the cause of your pneumonia, the medicines you take, and other medical conditions that you have. For most adults, treatment and recovery from pneumonia may occur at home. In some cases, treatment must happen in a hospital. Treatment may include:  Antibiotic medicines, if the pneumonia was caused by bacteria.  Antiviral medicines, if the pneumonia was caused by a virus.  Medicines that are given by mouth or through an IV tube.  Oxygen.  Respiratory therapy. Although rare, treating severe pneumonia may include:  Mechanical ventilation. This is done if you are not breathing well on your own and you cannot maintain a safe blood oxygen level.    Thoracentesis. This procedureremoves fluid around one lung or both lungs to help you breathe better. HOME CARE  INSTRUCTIONS  Take over-the-counter and prescription medicines only as told by your health care provider.  Only takecough medicine if you are losing sleep. Understand that cough medicine can prevent your body's natural ability to remove mucus from your lungs.  If you were prescribed an antibiotic medicine, take it as told by your health care provider. Do not stop taking the antibiotic even if you start to feel better.  Sleep in a semi-upright position at night. Try sleeping in a reclining chair, or place a few pillows under your head.  Do not use tobacco products, including cigarettes, chewing tobacco, and e-cigarettes. If you need help quitting, ask your health care provider.  Drink enough water to keep your urine clear or pale yellow. This will help to thin out mucus secretions in your lungs. PREVENTION There are ways that you can decrease your risk of developing community-acquired pneumonia. Consider getting a pneumococcal vaccine if:  You are older than 73 years of age.  You are older than 73 years of age and are undergoing cancer treatment, have chronic lung disease, or have other medical conditions that affect your immune system. Ask your health care provider if this applies to you. There are different types and schedules of pneumococcal vaccines. Ask your health care provider which vaccination option is best for you. You may also prevent community-acquired pneumonia if you take these actions:  Get an influenza vaccine every year. Ask your health care provider which type of influenza vaccine is best for you.  Go to the dentist on a regular basis.  Wash your hands often. Use hand sanitizer if soap and water are not available. SEEK MEDICAL CARE IF:  You have a fever.  You are losing sleep because you cannot control your cough with cough medicine. SEEK IMMEDIATE MEDICAL CARE IF:  You have worsening shortness of breath.  You have increased chest pain.  Your sickness becomes  worse, especially if you are an older adult or have a weakened immune system.  You cough up blood.   This information is not intended to replace advice given to you by your health care provider. Make sure you discuss any questions you have with your health care provider.   Document Released: 07/29/2005 Document Revised: 04/19/2015 Document Reviewed: 11/23/2014 Elsevier Interactive Patient Education 2016 [employer]  

## 2015-11-25 NOTE — Progress Notes (Deleted)
By signing my name below, I, [name], attest that this documentation has been prepared under the direction and in the presence of [name] Queen, MD.  Electronically Signed: Thea Jordan, ED Scribe. 11/20/2015. 8:13 AM.  Chief Complaint:  Chief Complaint  Patient presents with   Follow-up    Cough   HPI: [name] is a 75 y.o. male who reports to [facility] today for a follow up. Pt was seen here yesterday with productive cough, wheezing and SOB. CXR showed minimal infiltrate at the base of left lung. Pt reports improvement with sinus congestion, sinus pressure, and chest congestion. Pt still has productive cough but mucous is not as thick. He still has some wheeze but has improved since yesterday. He was unable to sleep laying down flat and found it to be more comfortable to sleep sitting up in a recliner. He has used the inhalers but has not had relief and believes nebulizer treatment was more effective.  He denies fever, chills and CP.  Past Medical History  Diagnosis Date   Coronary atherosclerosis of native coronary artery    Hyperlipidemia     with low HDL   Ankylosing spondylitis (HCC)    Hypertension    Allergic rhinitis    Cough    Abnormal chest x-ray    Colonic polyp    Hemorrhoids    Rotator cuff tear    Ganglion cyst of wrist     left   Other disorder of muscle, ligament, and fascia    Anxiety    History of malaria    Arthritis    Kidney stone    Heart attack ([place]) 2008   Swelling of lower extremity 10/16/2015   Past Surgical History  Procedure Laterality Date   Rotator cuff repair      bilat by Dr. French Ana   Hemorrhoid surgery  10/10    in [place]   Left heart catheterization with coronary angiogram N/A 09/03/2012    Procedure: LEFT HEART CATHETERIZATION WITH CORONARY ANGIOGRAM;  Surgeon: [name] Mocha, MD;  Location: [facility] CATH LAB;  Service: Cardiovascular;  Laterality: N/A;   Social History   Social History   Marital Status: Married    Spouse Name: Estate manager/land agent   Number of Children: 2   Years of Education: N/A   Social History Main Topics   Smoking status: Never Smoker    Smokeless tobacco: Never Used   Alcohol Use: No     Comment: occ 1 beer, wine occ   Drug Use: No   Sexual Activity: Yes   Other Topics Concern   Not on file   Social History Narrative   Family History  Problem Relation Age of Onset   Cancer Mother     melanoma   Emphysema Mother    Rheum arthritis Mother    Diabetes Mother    No Known Allergies Prior to Admission medications   Medication Sig Start Date End Date Taking? Authorizing Provider  albuterol (PROVENTIL HFA;VENTOLIN HFA) 108 (90 Base) MCG/ACT inhaler Inhale 2 puffs into the lungs every 4 (four) hours as needed for wheezing or shortness of breath (cough, shortness of breath or wheezing.). 11/24/15   [name] Russian, MD  amLODipine (NORVASC) 2.5 MG tablet Take 1 tablet (2.5 mg total) by mouth daily. 12/26/14   [name] Mocha, MD  aspirin 81 MG EC tablet Take 81 mg by mouth at bedtime.     Historical Provider, MD  doxycycline (VIBRA-TABS) 100 MG tablet Take 1 tablet (100  mg total) by mouth 2 (two) times daily. 11/24/15   [name] Russian, MD  HYDROcodone-homatropine [facility]) 5-1.5 MG/5ML syrup Take 5 mLs by mouth every 6 (six) hours as needed. 11/21/15   Leandrew Koyanagi, MD  predniSONE (DELTASONE) 10 MG tablet Take 4 a day for 3 days 3 a day for 3 days 2 a day for 3 days one a day for 3 days 11/24/15   [name] Russian, MD  simvastatin (ZOCOR) 20 MG tablet TAKE 1 TABLET EVERY DAY WITH BREAKFAST 06/08/15   [name] Mocha, MD     ROS: The patient denies fevers, chills, night sweats, unintentional weight loss, chest pain, palpitations,  dyspnea on exertion, nausea, vomiting, abdominal pain, dysuria, hematuria, melena, numbness, weakness, or tingling.   All other systems have been reviewed and were otherwise negative with the exception of those mentioned in the HPI and as above.     PHYSICAL EXAM:  Filed Vitals:   11/25/15 0815  BP: 120/74  Pulse: 72  Temp: 97.5 F (36.4 C)  TempSrc: Oral  Resp: 17  Weight: 191 lb (86.637 kg)  SpO2: 93%   Body mass index is 30.37 kg/(m^2).   General: Alert, no acute distress HEENT:  Normocephalic, atraumatic, oropharynx patent. No nasal congestion.  Eye: [name] [facility] Cardiovascular:  Regular rate and rhythm, no rubs murmurs or gallops.  No Carotid bruits, radial pulse intact. No pedal edema.  Respiratory: Clear to auscultation bilaterally.  No cyanosis, no use of accessory musculature. Bilateral inspiratory and expiratory wheeze. Breath sounds symmetrical.  Abdominal: No organomegaly, abdomen is soft and non-tender, positive bowel sounds.  No masses. Musculoskeletal: Gait intact. No edema, tenderness Skin: No rashes. Neurologic: Facial musculature symmetric. Psychiatric: Patient acts appropriately throughout our interaction. Lymphatic: No cervical or submandibular lymphadenopathy  Results for orders placed or performed in visit on 11/25/15  POCT CBC  Result Value Ref Range   WBC 4.3 (A) 4.6 - 10.2 K/uL   Lymph, poc 1.1 0.6 - 3.4   POC LYMPH PERCENT 26.5 10 - 50 %L   MID (cbc) 0.4 0 - 0.9   POC MID % 10.3 0 - 12 %M   POC Granulocyte 2.7 2 - 6.9   Granulocyte percent 63.2 37 - 80 %G   RBC 4.52 (A) 4.69 - 6.13 M/uL   Hemoglobin 14.2 14.1 - 18.1 g/dL   HCT, POC 39.7 (A) 43.5 - 53.7 %   MCV 87.9 80 - 97 fL   MCH, POC 31.4 (A) 27 - 31.2 pg   MCHC 35.7 (A) 31.8 - 35.4 g/dL   RDW, POC 15.9 %   Platelet Count, POC 58 (A) 142 - 424 K/uL   MPV 9.1 0 - 99.8 fL   ASSESSMENT/PLAN: Stat d-dimer done. Will stay the course. His oxygen level fluctuates between 93 and 95 with ambulation. No changes at the present time. Peak flow done ranges between 300 and 350. Recheck on Monday if not showing significant improvement. If his d-dimer is elevated he will have to go to the ER. Stat d-dimer which was done was normal.I personally  performed the services described in this documentation, which was scribed in my presence. The recorded information has been reviewed and is accurate.   Gross sideeffects, risk and benefits, and alternatives of medications d/w patient. Patient is aware that all medications have potential sideeffects and we are unable to predict every sideeffect or drug-drug interaction that may occur.  [name] Queen MD 11/25/2015 8:13 AM

## 2015-11-25 NOTE — Telephone Encounter (Signed)
Received D-dimer results from [facility]. D-Dimer 0.34.  Called pt and advised of results.

## 2015-11-28 ENCOUNTER — Ambulatory Visit (INDEPENDENT_AMBULATORY_CARE_PROVIDER_SITE_OTHER): Payer: Medicare Other | Admitting: Emergency Medicine

## 2015-11-28 ENCOUNTER — Encounter: Payer: Self-pay | Admitting: Emergency Medicine

## 2015-11-28 ENCOUNTER — Ambulatory Visit (INDEPENDENT_AMBULATORY_CARE_PROVIDER_SITE_OTHER): Payer: Medicare Other

## 2015-11-28 ENCOUNTER — Telehealth: Payer: Self-pay | Admitting: Pulmonary Disease

## 2015-11-28 ENCOUNTER — Telehealth: Payer: Self-pay | Admitting: *Deleted

## 2015-11-28 VITALS — BP 124/74 | HR 60 | Temp 97.9°F | Resp 16 | Ht 66.0 in | Wt 194.2 lb

## 2015-11-28 DIAGNOSIS — R05 Cough: Secondary | ICD-10-CM

## 2015-11-28 DIAGNOSIS — Z1159 Encounter for screening for other viral diseases: Secondary | ICD-10-CM | POA: Diagnosis not present

## 2015-11-28 DIAGNOSIS — R062 Wheezing: Secondary | ICD-10-CM

## 2015-11-28 DIAGNOSIS — R0602 Shortness of breath: Secondary | ICD-10-CM

## 2015-11-28 DIAGNOSIS — R059 Cough, unspecified: Secondary | ICD-10-CM

## 2015-11-28 DIAGNOSIS — D696 Thrombocytopenia, unspecified: Secondary | ICD-10-CM | POA: Diagnosis not present

## 2015-11-28 DIAGNOSIS — Z114 Encounter for screening for human immunodeficiency virus [HIV]: Secondary | ICD-10-CM

## 2015-11-28 DIAGNOSIS — J189 Pneumonia, unspecified organism: Secondary | ICD-10-CM | POA: Diagnosis not present

## 2015-11-28 LAB — COMPLETE METABOLIC PANEL WITH GFR
ALBUMIN: 4.5 g/dL (ref 3.6–5.1)
ALT: 25 U/L (ref 9–46)
AST: 21 U/L (ref 10–35)
Alkaline Phosphatase: 58 U/L (ref 40–115)
BUN: 16 mg/dL (ref 7–25)
CHLORIDE: 103 mmol/L (ref 98–110)
CO2: 26 mmol/L (ref 20–31)
CREATININE: 0.91 mg/dL (ref 0.70–1.18)
Calcium: 9.2 mg/dL (ref 8.6–10.3)
GFR, Est African American: >89 mL/min (ref >=60)
GFR, Est Non African American: 84 mL/min (ref >=60)
GLUCOSE: 140 mg/dL — AB (ref 65–99)
POTASSIUM: 3.9 mmol/L (ref 3.5–5.3)
SODIUM: 140 mmol/L (ref 135–146)
Total Bilirubin: 0.6 mg/dL (ref 0.2–1.2)
Total Protein: 6.8 g/dL (ref 6.1–8.1)

## 2015-11-28 LAB — POCT URINALYSIS DIP (MANUAL ENTRY)
BILIRUBIN UA: NEGATIVE
Bilirubin, UA: NEGATIVE
Blood, UA: NEGATIVE
Glucose, UA: NEGATIVE
LEUKOCYTES UA: NEGATIVE
NITRITE UA: NEGATIVE
PH UA: 6
PROTEIN UA: NEGATIVE
SPEC GRAV UA: 1.01
UROBILINOGEN UA: 0.2

## 2015-11-28 LAB — POCT CBC
GRANULOCYTE PERCENT: 70.4 % (ref 37–80)
HCT, POC: 40.3 % — AB (ref 43.5–53.7)
Hemoglobin: 14.1 g/dL (ref 14.1–18.1)
Lymph, poc: 1.7 (ref 0.6–3.4)
MCH, POC: 30.9 pg (ref 27–31.2)
MCHC: 35 g/dL (ref 31.8–35.4)
MCV: 88.3 fL (ref 80–97)
MID (CBC): 0.4 (ref 0–0.9)
MPV: 8.8 fL (ref 0–99.8)
PLATELET COUNT, POC: 95 10*3/uL — AB (ref 142–424)
POC Granulocyte: 4.9 (ref 2–6.9)
POC LYMPH %: 23.8 % (ref 10–50)
POC MID %: 5.8 %M (ref 0–12)
RBC: 4.57 M/uL — AB (ref 4.69–6.13)
RDW, POC: 16.2 %
WBC: 7 10*3/uL (ref 4.6–10.2)

## 2015-11-28 LAB — POC MICROSCOPIC URINALYSIS (UMFC): Mucus: ABSENT

## 2015-11-28 LAB — HIV ANTIBODY (ROUTINE TESTING W REFLEX): HIV 1&2 Ab, 4th Generation: NONREACTIVE

## 2015-11-28 LAB — SEDIMENTATION RATE: Sed Rate: 1 mm/hr (ref 0–20)

## 2015-11-28 LAB — C-REACTIVE PROTEIN: CRP: <0.5 mg/dL (ref <0.60)

## 2015-11-28 MED ORDER — PREDNISONE 10 MG PO TABS: ORAL_TABLET | ORAL | Status: DC

## 2015-11-28 MED ORDER — ALBUTEROL SULFATE (2.5 MG/3ML) 0.083% IN NEBU
2.5000 mg | INHALATION_SOLUTION | Freq: Once | RESPIRATORY_TRACT | Status: AC
  Administered 2015-11-28: 2.5 mg via RESPIRATORY_TRACT

## 2015-11-28 MED ORDER — IPRATROPIUM BROMIDE 0.02 % IN SOLN
0.5000 mg | Freq: Once | RESPIRATORY_TRACT | Status: AC
  Administered 2015-11-28: 0.5 mg via RESPIRATORY_TRACT

## 2015-11-28 NOTE — Patient Instructions (Signed)
I sent in a new refill  for prednisone. I have placed a call to Dr. Lenna Gilford. I will call you with results of your most recent blood tests.      IF you received an x-ray today, you will receive an invoice from [facility]. Please contact [facility] at [phone] with questions or concerns regarding your invoice.   IF you received labwork today, you will receive an invoice from [employer]. Please contact [facility] at [phone] with questions or concerns regarding your invoice.   Our billing staff will not be able to assist you with questions regarding bills from these companies.  You will be contacted with the lab results as soon as they are available. The fastest way to get your results is to activate your My Chart account. Instructions are located on the last page of this paperwork. If you have not heard from [place] regarding the results in 2 weeks, please contact this office.

## 2015-11-28 NOTE — Telephone Encounter (Signed)
Patient informed is able to [place] CPAP machine no longer contagious .  Patient understood

## 2015-11-28 NOTE — Addendum Note (Signed)
Addended by: [name] A on: 11/28/2015 12:05 PM   Modules accepted: Orders

## 2015-11-28 NOTE — Telephone Encounter (Signed)
He is no longer contagious and can use his CPAP she

## 2015-11-28 NOTE — Addendum Note (Signed)
Addended by: [name] A on: 11/28/2015 01:58 PM   Modules accepted: Orders

## 2015-11-28 NOTE — Telephone Encounter (Signed)
Patient called stating that the pharmacy would not fill his medication because it was denied the pharmacy.  Spoke with Dr. Everlene Farrier and new medication rx was sent over.  Patient was made aware.  Patient also wants to know if he is still contagious and if he can use his son cpap machine.

## 2015-11-28 NOTE — Telephone Encounter (Signed)
Spoke with SN and notified him of message  Per verbal order from SN  Take a message  Dr. Everlene Farrier  [phone]  Will forward to [facility]

## 2015-11-28 NOTE — Telephone Encounter (Signed)
Per Dr. Lenna Gilford:  Call patient for add-on appointment.  Can be seen either Friday 4/21 at 10am or Friday 10/28 10am  ------------------------ Patient scheduled to see Dr. Lenna Gilford on 12/01/15 at 10am Patient aware of appointment. Nothing further needed.

## 2015-11-28 NOTE — Progress Notes (Signed)
Subjective: [name] is a 75 y.o. male patient of [facility] presenting for follow up of dyspnea and cough.  [name] returns for follow up of symptoms over the past week that have included cough and nasal drainage, including wheezing worse at night, and dyspnea worse with recumbency. He denies exertional dyspnea at this time. He's taking doxycycline, on day 5 of 10, after completing 3 days of azithromycin without seeing a dramatic change in symptoms. Also on day 5 of prednisone taper which he believes is helping a bit, though he continues to have persistent cough. Albuterol has not seemed to help much. No other medications tried.   Prior to this, [name] reports off and on chest congestion for 6 months with clear-to-no phlegm, some head congestion, and also fatigue that has been severe.   - ROS: No chest pain, palpitations. No N/V/D/C. Denies fevers, had chills/sweats earlier this week. + right ankle swelling mild - Nonsmoker, seldom alcohol, no h/o IVDU. Retired Clinical biochemist, denies consistent exposure to solvents, asbestos, or [place]/etc. - Seen at oncology for ITP. Has Dx ankylosing spondylitis, denies back pain but has had decreased ROM.  - Seen by cardiology, Dr. Burt Knack, for CAD, s/p PCI 2008 and some exertional angina.  Objective: BP 124/74 mmHg  Pulse 60  Temp(Src) 97.9 F (36.6 C) (Oral)  Resp 16  Ht 5\' 6"  (1.676 m)  Wt 194 lb 3.2 oz (88.089 kg)  BMI 31.36 kg/m2  SpO2 97% Gen: Pleasant 75 y.o. male in no distress HEENT: Conjunctivae normal, nares normal, moist mucous membranes, posterior oropharynx clear CV: Regular rate, no murmur; trace bilateral LE edema, no JVD, cap refill < 2 sec. Pulm: Non-labored breathing ambient air, decreased breath sounds with crackles at right base, scattered wheezes.  Results for orders placed or performed in visit on 11/28/15  POCT Microscopic Urinalysis ([facility])  Result Value Ref Range   WBC,UR,HPF,POC Few (A) None WBC/hpf   RBC,UR,HPF,POC None None RBC/hpf   Bacteria None None, Too numerous to count   Mucus Absent Absent   Epithelial Cells, UR Per Microscopy None None, Too numerous to count cells/hpf  POCT urinalysis dipstick  Result Value Ref Range   Color, UA yellow yellow   Clarity, UA clear clear   Glucose, UA negative negative   Bilirubin, UA negative negative   Ketones, POC UA negative negative   Spec Grav, UA 1.010    Blood, UA negative negative   pH, UA 6.0    Protein Ur, POC negative negative   Urobilinogen, UA 0.2    Nitrite, UA Negative Negative   Leukocytes, UA Negative Negative  [name] 2 View  11/28/2015  CLINICAL DATA:  Wheezing, cough.  Community acquired pneumonia. EXAM: CHEST  2 VIEW COMPARISON:  11/24/2015 FINDINGS: Chronic elevation of the right hemi diaphragm. No confluent opacity on the left. No effusion. Heart is normal size. No acute bony abnormality. IMPRESSION: Chronic elevation of the right hemidiaphragm.  No active disease. Electronically Signed   By: [name] M.D.   On: 11/28/2015 09:13   [name] 2 View  11/24/2015  CLINICAL DATA:  Cough and wheezing. EXAM: CHEST  2 VIEW COMPARISON:  11/21/2015.  12/24/2014 FINDINGS: Mediastinum and hilar structures normal. Heart size stable. Minimal infiltrate in the left lung base cannot be excluded. No trauma pleural effusion. No pneumothorax. Stable elevation right hemidiaphragm. Degenerative changes thoracic spine. Interposition of colon under the right hemidiaphragm again noted. IMPRESSION: 1. Minimal infiltrate left lung base cannot be excluded. 2.  Chronic elevation right hemidiaphragm . Electronically Signed   By: [name]  Register   On: 11/24/2015 11:05   [name] 2 View  11/21/2015  CLINICAL DATA:  Cough and congestion EXAM: CHEST  2 VIEW COMPARISON:  12/24/2014 FINDINGS: Elevated right hemidiaphragm unchanged with right lower lobe atelectasis. Mild cardiac enlargement. Negative for heart failure. Left lung is clear. Negative for  mass lesion. IMPRESSION: Chronic elevation right hemidiaphragm.  No acute abnormality. Electronically Signed   By: [name] M.D.   On: 11/21/2015 10:14   Results for orders placed or performed in visit on 11/28/15  POCT Microscopic Urinalysis ([facility])  Result Value Ref Range   WBC,UR,HPF,POC Few (A) None WBC/hpf   RBC,UR,HPF,POC None None RBC/hpf   Bacteria None None, Too numerous to count   Mucus Absent Absent   Epithelial Cells, UR Per Microscopy None None, Too numerous to count cells/hpf  POCT urinalysis dipstick  Result Value Ref Range   Color, UA yellow yellow   Clarity, UA clear clear   Glucose, UA negative negative   Bilirubin, UA negative negative   Ketones, POC UA negative negative   Spec Grav, UA 1.010    Blood, UA negative negative   pH, UA 6.0    Protein Ur, POC negative negative   Urobilinogen, UA 0.2    Nitrite, UA Negative Negative   Leukocytes, UA Negative Negative  POCT CBC  Result Value Ref Range   WBC 7.0 4.6 - 10.2 K/uL   Lymph, poc 1.7 0.6 - 3.4   POC LYMPH PERCENT 23.8 10 - 50 %L   MID (cbc) 0.4 0 - 0.9   POC MID % 5.8 0 - 12 %M   POC Granulocyte 4.9 2 - 6.9   Granulocyte percent 70.4 37 - 80 %G   RBC 4.57 (A) 4.69 - 6.13 M/uL   Hemoglobin 14.1 14.1 - 18.1 g/dL   HCT, POC 40.3 (A) 43.5 - 53.7 %   MCV 88.3 80 - 97 fL   MCH, POC 30.9 27 - 31.2 pg   MCHC 35.0 31.8 - 35.4 g/dL   RDW, POC 16.2 %   Platelet Count, POC 95 (A) 142 - 424 K/uL   MPV 8.8 0 - 99.8 fL   Assessment/Plan: [name] is a 75 y.o. male here for cough.   Presentation may be an acute worsening of a more chronic process. Chronicity and history or ITP, ankylosing spondylitis in addition to failure to improve with treatment for respiratory bacterial pathogens suggests possibility of ILD. No occupational exposures. Pt had a chest scan for coronary calcium score in 2008 which incidentally showed RUL and RLL calcified nodules consistent with granulomas. Blood work x-ray would say  patient is recovering. I have reeducated him regarding the use of his inhaler. He will be at prednisone taper dose starting at 40 mg a day. Referral made to Dr. Lenna Gilford . Other blood work added including a sedimentation rate CRP , Cmet and BNP.I personally performed the services described in this documentation, which was scribed in my presence. The recorded information has been reviewed and is accurate.  With primary presenting complaint of cough, differential including CHF is considered, especially with h/o CPI for CAD. But no history of CHF and no pulmonary edema noted on CXR.

## 2015-11-29 ENCOUNTER — Other Ambulatory Visit: Payer: Self-pay | Admitting: Emergency Medicine

## 2015-11-29 ENCOUNTER — Telehealth: Payer: Self-pay | Admitting: Emergency Medicine

## 2015-11-29 ENCOUNTER — Inpatient Hospital Stay (HOSPITAL_COMMUNITY)
Admission: EM | Admit: 2015-11-29 | Discharge: 2015-12-01 | DRG: 203 | Disposition: A | Discharge status: Home / Self Care | Payer: Medicare Other | Attending: Internal Medicine | Admitting: Internal Medicine

## 2015-11-29 ENCOUNTER — Encounter (HOSPITAL_COMMUNITY): Payer: Self-pay | Admitting: Emergency Medicine

## 2015-11-29 ENCOUNTER — Telehealth: Payer: Self-pay

## 2015-11-29 DIAGNOSIS — R05 Cough: Secondary | ICD-10-CM

## 2015-11-29 DIAGNOSIS — J986 Disorders of diaphragm: Secondary | ICD-10-CM | POA: Diagnosis not present

## 2015-11-29 DIAGNOSIS — M459 Ankylosing spondylitis of unspecified sites in spine: Secondary | ICD-10-CM | POA: Diagnosis not present

## 2015-11-29 DIAGNOSIS — Z87442 Personal history of urinary calculi: Secondary | ICD-10-CM

## 2015-11-29 DIAGNOSIS — Z808 Family history of malignant neoplasm of other organs or systems: Secondary | ICD-10-CM

## 2015-11-29 DIAGNOSIS — I252 Old myocardial infarction: Secondary | ICD-10-CM

## 2015-11-29 DIAGNOSIS — R0601 Orthopnea: Secondary | ICD-10-CM | POA: Diagnosis present

## 2015-11-29 DIAGNOSIS — Z79899 Other long term (current) drug therapy: Secondary | ICD-10-CM

## 2015-11-29 DIAGNOSIS — Z825 Family history of asthma and other chronic lower respiratory diseases: Secondary | ICD-10-CM

## 2015-11-29 DIAGNOSIS — Z8261 Family history of arthritis: Secondary | ICD-10-CM

## 2015-11-29 DIAGNOSIS — R0902 Hypoxemia: Secondary | ICD-10-CM | POA: Diagnosis present

## 2015-11-29 DIAGNOSIS — Z7952 Long term (current) use of systemic steroids: Secondary | ICD-10-CM

## 2015-11-29 DIAGNOSIS — G4733 Obstructive sleep apnea (adult) (pediatric): Secondary | ICD-10-CM | POA: Diagnosis present

## 2015-11-29 DIAGNOSIS — R059 Cough, unspecified: Secondary | ICD-10-CM

## 2015-11-29 DIAGNOSIS — J441 Chronic obstructive pulmonary disease with (acute) exacerbation: Secondary | ICD-10-CM

## 2015-11-29 DIAGNOSIS — J45909 Unspecified asthma, uncomplicated: Secondary | ICD-10-CM | POA: Diagnosis present

## 2015-11-29 DIAGNOSIS — Z955 Presence of coronary angioplasty implant and graft: Secondary | ICD-10-CM

## 2015-11-29 DIAGNOSIS — I119 Hypertensive heart disease without heart failure: Secondary | ICD-10-CM | POA: Diagnosis present

## 2015-11-29 DIAGNOSIS — I2510 Atherosclerotic heart disease of native coronary artery without angina pectoris: Secondary | ICD-10-CM | POA: Diagnosis present

## 2015-11-29 DIAGNOSIS — Z833 Family history of diabetes mellitus: Secondary | ICD-10-CM

## 2015-11-29 DIAGNOSIS — R0602 Shortness of breath: Secondary | ICD-10-CM | POA: Diagnosis not present

## 2015-11-29 DIAGNOSIS — E785 Hyperlipidemia, unspecified: Secondary | ICD-10-CM | POA: Diagnosis present

## 2015-11-29 DIAGNOSIS — Z8613 Personal history of malaria: Secondary | ICD-10-CM

## 2015-11-29 DIAGNOSIS — J209 Acute bronchitis, unspecified: Principal | ICD-10-CM | POA: Diagnosis present

## 2015-11-29 DIAGNOSIS — Z7982 Long term (current) use of aspirin: Secondary | ICD-10-CM

## 2015-11-29 DIAGNOSIS — R0600 Dyspnea, unspecified: Secondary | ICD-10-CM

## 2015-11-29 DIAGNOSIS — K219 Gastro-esophageal reflux disease without esophagitis: Secondary | ICD-10-CM | POA: Diagnosis present

## 2015-11-29 DIAGNOSIS — Z86010 Personal history of colonic polyps: Secondary | ICD-10-CM

## 2015-11-29 DIAGNOSIS — F419 Anxiety disorder, unspecified: Secondary | ICD-10-CM | POA: Diagnosis present

## 2015-11-29 LAB — BRAIN NATRIURETIC PEPTIDE: Brain Natriuretic Peptide: 17.2 pg/mL (ref <100)

## 2015-11-29 MED ORDER — OMEPRAZOLE 40 MG PO CPDR: 40.0000 mg | DELAYED_RELEASE_CAPSULE | Freq: Every day | ORAL | Status: DC

## 2015-11-29 NOTE — Telephone Encounter (Signed)
IC patient with messages from Dr. Everlene Farrier.  #1) Meds called to pharmacy for esophageal reflux to make sure this is not causing his cough.  Pt had questions about this - did not realize you could have reflux without "burning". #2) the steroids will cause his sugar to be elevated - pt acknowledged he will stay off bread and sugars until he is off the prednisone.  Dr. Everlene Farrier stated he also has discussed this with Dr. Lenna Gilford.  Pt has appt on Friday 4/21 with Lenna Gilford. Pt appreciative of call.

## 2015-11-29 NOTE — Telephone Encounter (Signed)
Dr. Everlene Farrier is he supposed to be on this medication?

## 2015-11-29 NOTE — Telephone Encounter (Signed)
Pt has a question about the rx for heartburn Omeprazole he believes that we might have called this in for the wrong patient  Best number [phone]

## 2015-11-29 NOTE — ED Provider Notes (Signed)
CSN: [id number]     Arrival date & time 11/29/15  2340 History  By signing my name below, I, Rowan Blase, attest that this documentation has been prepared under the direction and in the presence of Samyria Rudie, MD . Electronically Signed: Rowan Blase, Scribe. 11/30/2015. 1:02 AM.   Chief Complaint  Patient presents with  . Shortness of Breath   Patient is a 75 y.o. male presenting with shortness of breath. The history is provided by the patient. No language interpreter was used.  Shortness of Breath Severity:  Moderate Onset quality:  Gradual Timing:  Constant Progression:  Worsening Chronicity:  New Context: URI   Context: not activity   Relieved by:  Nothing Worsened by:  Nothing tried Ineffective treatments:  Inhaler Associated symptoms: cough and wheezing   Associated symptoms: no fever and no sore throat   Risk factors: no prolonged immobilization    HPI Comments:  THADE SLEDZ is a 75 y.o. male with PMHx of HLD, HTN, coronary atherosclerosis and MI who presents to the Emergency Department complaining of worsening shortness of breath earlier tonight. Pt reports associated cough productive of clear mucous for 1.5 weeks, left abdominal pain with cough, and right ankle swelling. Pt was diagnosed with pneumonia a few days ago by Dr. Everlene Farrier; he was started on Prednisone, Albuterol, Doxycycline, and Hycodan. Pt reports his last Albuterol ~2 hrs ago, he finished Hycodan, and he finished Prednisone yesterday evening. He also took Musinex a few days ago without relief. He notes breathing treatment at from Dr. Everlene Farrier provided relief.  Past Medical History  Diagnosis Date  . Coronary atherosclerosis of native coronary artery   . Hyperlipidemia     with low HDL  . Ankylosing spondylitis ([place])   . Hypertension   . Allergic rhinitis   . Cough   . Abnormal chest x-ray   . Colonic polyp   . Hemorrhoids   . Rotator cuff tear   . Ganglion cyst of wrist     left  . Other  disorder of muscle, ligament, and fascia   . Anxiety   . History of malaria   . Arthritis   . Kidney stone   . Heart attack ([place]) 2008  . Swelling of lower extremity 10/16/2015   Past Surgical History  Procedure Laterality Date  . Rotator cuff repair      bilat by Dr. French Ana  . Hemorrhoid surgery  10/10    in [place]  . Left heart catheterization with coronary angiogram N/A 09/03/2012    Procedure: LEFT HEART CATHETERIZATION WITH CORONARY ANGIOGRAM;  Surgeon: [name], MD;  Location: [facility] CATH LAB;  Service: Cardiovascular;  Laterality: N/A;   Family History  Problem Relation Age of Onset  . Cancer Mother     melanoma  . Emphysema Mother   . Rheum arthritis Mother   . Diabetes Mother    Social History  Substance Use Topics  . Smoking status: Never Smoker   . Smokeless tobacco: Never Used  . Alcohol Use: No     Comment: occ 1 beer, wine occ    Review of Systems  Constitutional: Negative for fever.  HENT: Negative for sore throat.   Respiratory: Positive for cough, shortness of breath and wheezing.   Cardiovascular: Negative for palpitations and leg swelling.  All other systems reviewed and are negative.  Allergies  Review of patient's allergies indicates no known allergies.  Home Medications   Prior to Admission medications   Medication Sig Start Date End  Date Taking? Authorizing Provider  albuterol (PROVENTIL HFA;VENTOLIN HFA) 108 (90 Base) MCG/ACT inhaler Inhale 2 puffs into the lungs every 4 (four) hours as needed for wheezing or shortness of breath (cough, shortness of breath or wheezing.). 11/24/15  Yes [name], MD  amLODipine (NORVASC) 2.5 MG tablet Take 1 tablet (2.5 mg total) by mouth daily. 12/26/14  Yes [name], MD  aspirin 81 MG EC tablet Take 81 mg by mouth at bedtime.    Yes Historical Provider, MD  doxycycline (VIBRA-TABS) 100 MG tablet Take 1 tablet (100 mg total) by mouth 2 (two) times daily. 11/24/15  Yes [name], MD  omeprazole  (PRILOSEC) 40 MG capsule Take 1 capsule (40 mg total) by mouth daily. 11/29/15  Yes [name], MD  predniSONE (DELTASONE) 10 MG tablet Take 6 tablets the first 2  Days  then 3 tablets a day for 3 days 2 tablets a day for 3 days one tablet a day for 3 days Patient taking differently: Take 60 mg by mouth daily with breakfast.  11/28/15  Yes [name], MD  simvastatin (ZOCOR) 20 MG tablet TAKE 1 TABLET EVERY DAY WITH BREAKFAST Patient taking differently: TAKE 20 MG BY MOUTH EVERY DAY WITH BREAKFAST 06/08/15  Yes [name], MD  HYDROcodone-homatropine [facility]) 5-1.5 MG/5ML syrup Take 5 mLs by mouth every 6 (six) hours as needed. Patient not taking: Reported on 11/30/2015 11/21/15   Leandrew Koyanagi, MD   BP 143/66 mmHg  Pulse 73  Temp(Src) 97.7 F (36.5 C) (Oral)  Resp 13  SpO2 93% Physical Exam  Constitutional: He is oriented to person, place, and time. He appears well-developed and well-nourished. No distress.  HENT:  Head: Normocephalic and atraumatic.  Mouth/Throat: Oropharynx is clear and moist and mucous membranes are normal.  Eyes: EOM are normal. Pupils are equal, round, and reactive to light.  Neck: Normal range of motion. Neck supple. Carotid bruit is not present.  Cardiovascular: Normal rate, regular rhythm and intact distal pulses.   Pulses:      Dorsalis pedis pulses are 2+ on the right side, and 2+ on the left side.  Pulmonary/Chest: No stridor. No respiratory distress. He has no wheezes. He has no rales.  Diminished BL  Abdominal: Soft. Bowel sounds are normal. There is no tenderness. There is no rebound and no guarding.  Musculoskeletal: Normal range of motion.  Neurological: He is alert and oriented to person, place, and time. He has normal reflexes.  Skin: Skin is warm and dry.  Psychiatric: He has a normal mood and affect.    ED Course  Procedures  DIAGNOSTIC STUDIES:  Oxygen Saturation is 92% on RA, low by my interpretation.    COORDINATION OF  CARE:  12:37 AM Will administer breathing treatment. Discussed treatment plan with pt at bedside and pt agreed to plan.  Labs Review Labs Reviewed  CBC WITH DIFFERENTIAL/PLATELET - Abnormal; Notable for the following:    Platelets 113 (*)    All other components within normal limits  I-STAT CHEM 8, ED - Abnormal; Notable for the following:    BUN 27 (*)    Glucose, Bld 175 (*)    Calcium, Ion 1.12 (*)    All other components within normal limits  I-STAT TROPOININ, ED    Imaging Review [name] 2 View  11/30/2015  CLINICAL DATA:  Left upper abdominal pain for 1 week, worse tonight. Shortness of breath. EXAM: CHEST  2 VIEW COMPARISON:  Chest radiographs  2 days prior 11/28/2015 FINDINGS: Elevated right hemidiaphragm again seen, likely accentuated by lower lung volumes. Cardiomediastinal contours are unchanged. No pulmonary edema, focal consolidation, large pleural effusion or pneumothorax. Flowing anterior osteophytes in the thoracic spine. IMPRESSION: No change from prior exam allowing for lower lung volumes. Chronic elevation of right hemidiaphragm. Electronically Signed   By: Jeb Levering M.D.   On: 11/30/2015 00:48   [name] 2 View  11/28/2015  CLINICAL DATA:  Wheezing, cough.  Community acquired pneumonia. EXAM: CHEST  2 VIEW COMPARISON:  11/24/2015 FINDINGS: Chronic elevation of the right hemi diaphragm. No confluent opacity on the left. No effusion. Heart is normal size. No acute bony abnormality. IMPRESSION: Chronic elevation of the right hemidiaphragm.  No active disease. Electronically Signed   By: [name] M.D.   On: 11/28/2015 09:13   Ct Angio Chest Pe W/cm &/or Wo Cm  11/30/2015  CLINICAL DATA:  Increasing shortness of breath and productive cough. Recent treatment for pneumonia. EXAM: CT ANGIOGRAPHY CHEST WITH CONTRAST TECHNIQUE: Multidetector CT imaging of the chest was performed using the standard protocol during bolus administration of intravenous contrast. Multiplanar  CT image reconstructions and MIPs were obtained to evaluate the vascular anatomy. CONTRAST:  100 mL Isovue 370 COMPARISON:  None. FINDINGS: Good opacification of the central and segmental pulmonary arteries. No focal filling defects identified. It no evidence of significant pulmonary embolus. Normal heart size. Normal caliber thoracic aorta. No aortic dissection. Great vessel origins are patent. Calcification in the aorta and coronary arteries. Esophagus is decompressed. No significant lymphadenopathy in the chest. Shallow inspiration. Evaluation of lungs is limited due to respiratory motion artifact atelectasis in the lung bases. There appears to be a somewhat mosaic pattern to the lungs which could be due to motion artifact, edema, or air trapping. Airways appear patent. No focal consolidation demonstrated. Calcified granulomas in the lungs. No pleural effusions. No pneumothorax. Included portions of the upper abdominal organs demonstrate colonic interposition over the liver. Degenerative changes in the spine. No destructive bone lesions. Review of the MIP images confirms the above findings. IMPRESSION: No evidence of significant pulmonary embolus. Shallow inspiration. Atelectasis in the lung bases. Mosaic attenuation pattern may be due to motion artifact, edema, or air trapping. Electronically Signed   By: Lucienne Capers M.D.   On: 11/30/2015 02:43   I have personally reviewed and evaluated these images and lab results as part of my medical decision-making.   EKG Interpretation   Date/Time:  Thursday Champion Corales 20 2017 00:10:34 EDT Ventricular Rate:  83 PR Interval:  151 QRS Duration: 93 QT Interval:  401 QTC Calculation: 471 R Axis:   56 Text Interpretation:  Sinus rhythm Confirmed by [facility]  MD, Merry Pond  ([id number]) on 11/30/2015 1:13:35 AM      MDM   Final diagnoses:  None   Results for orders placed or performed during the hospital encounter of 11/29/15  CBC with Differential/Platelet   Result Value Ref Range   WBC 9.9 4.0 - 10.5 K/uL   RBC 4.42 4.22 - 5.81 MIL/uL   Hemoglobin 13.2 13.0 - 17.0 g/dL   HCT 39.1 39.0 - 52.0 %   MCV 88.5 78.0 - 100.0 fL   MCH 29.9 26.0 - 34.0 pg   MCHC 33.8 30.0 - 36.0 g/dL   RDW 15.5 11.5 - 15.5 %   Platelets 113 (L) 150 - 400 K/uL   Neutrophils Relative % 77 %   Lymphocytes Relative 17 %   Monocytes Relative 6 %  Eosinophils Relative 0 %   Basophils Relative 0 %   Band Neutrophils 0 %   Metamyelocytes Relative 0 %   Myelocytes 0 %   Promyelocytes Absolute 0 %   Blasts 0 %   nRBC 0 0 /100 WBC   Other 0 %   Neutro Abs 7.6 1.7 - 7.7 K/uL   Lymphs Abs 1.7 0.7 - 4.0 K/uL   Monocytes Absolute 0.6 0.1 - 1.0 K/uL   Eosinophils Absolute 0.0 0.0 - 0.7 K/uL   Basophils Absolute 0.0 0.0 - 0.1 K/uL   WBC Morphology MILD LEFT SHIFT (1-5% METAS, OCC MYELO, OCC BANDS)    Smear Review LARGE PLATELETS PRESENT   I-stat chem 8, ed  Result Value Ref Range   Sodium 139 135 - 145 mmol/L   Potassium 4.0 3.5 - 5.1 mmol/L   Chloride 105 101 - 111 mmol/L   BUN 27 (H) 6 - 20 mg/dL   Creatinine, Ser 1.10 0.61 - 1.24 mg/dL   Glucose, Bld 175 (H) 65 - 99 mg/dL   Calcium, Ion 1.12 (L) 1.13 - 1.30 mmol/L   TCO2 21 0 - 100 mmol/L   Hemoglobin 14.3 13.0 - 17.0 g/dL   HCT 42.0 39.0 - 52.0 %  I-stat troponin, ED  Result Value Ref Range   Troponin i, poc 0.00 0.00 - 0.08 ng/mL   Comment 3           [name] 2 View  11/30/2015  CLINICAL DATA:  Left upper abdominal pain for 1 week, worse tonight. Shortness of breath. EXAM: CHEST  2 VIEW COMPARISON:  Chest radiographs 2 days prior 11/28/2015 FINDINGS: Elevated right hemidiaphragm again seen, likely accentuated by lower lung volumes. Cardiomediastinal contours are unchanged. No pulmonary edema, focal consolidation, large pleural effusion or pneumothorax. Flowing anterior osteophytes in the thoracic spine. IMPRESSION: No change from prior exam allowing for lower lung volumes. Chronic elevation of right  hemidiaphragm. Electronically Signed   By: Jeb Levering M.D.   On: 11/30/2015 00:48   [name] 2 View  11/28/2015  CLINICAL DATA:  Wheezing, cough.  Community acquired pneumonia. EXAM: CHEST  2 VIEW COMPARISON:  11/24/2015 FINDINGS: Chronic elevation of the right hemi diaphragm. No confluent opacity on the left. No effusion. Heart is normal size. No acute bony abnormality. IMPRESSION: Chronic elevation of the right hemidiaphragm.  No active disease. Electronically Signed   By: [name] M.D.   On: 11/28/2015 09:13   [name] 2 View  11/24/2015  CLINICAL DATA:  Cough and wheezing. EXAM: CHEST  2 VIEW COMPARISON:  11/21/2015.  12/24/2014 FINDINGS: Mediastinum and hilar structures normal. Heart size stable. Minimal infiltrate in the left lung base cannot be excluded. No trauma pleural effusion. No pneumothorax. Stable elevation right hemidiaphragm. Degenerative changes thoracic spine. Interposition of colon under the right hemidiaphragm again noted. IMPRESSION: 1. Minimal infiltrate left lung base cannot be excluded. 2.  Chronic elevation right hemidiaphragm . Electronically Signed   By: [name]  Register   On: 11/24/2015 11:05   [name] 2 View  11/21/2015  CLINICAL DATA:  Cough and congestion EXAM: CHEST  2 VIEW COMPARISON:  12/24/2014 FINDINGS: Elevated right hemidiaphragm unchanged with right lower lobe atelectasis. Mild cardiac enlargement. Negative for heart failure. Left lung is clear. Negative for mass lesion. IMPRESSION: Chronic elevation right hemidiaphragm.  No acute abnormality. Electronically Signed   By: [name] M.D.   On: 11/21/2015 10:14   Ct Angio Chest Pe W/cm &/or Wo Cm  11/30/2015  CLINICAL DATA:  Increasing shortness of breath and productive cough. Recent treatment for pneumonia. EXAM: CT ANGIOGRAPHY CHEST WITH CONTRAST TECHNIQUE: Multidetector CT imaging of the chest was performed using the standard protocol during bolus administration of intravenous contrast. Multiplanar  CT image reconstructions and MIPs were obtained to evaluate the vascular anatomy. CONTRAST:  100 mL Isovue 370 COMPARISON:  None. FINDINGS: Good opacification of the central and segmental pulmonary arteries. No focal filling defects identified. It no evidence of significant pulmonary embolus. Normal heart size. Normal caliber thoracic aorta. No aortic dissection. Great vessel origins are patent. Calcification in the aorta and coronary arteries. Esophagus is decompressed. No significant lymphadenopathy in the chest. Shallow inspiration. Evaluation of lungs is limited due to respiratory motion artifact atelectasis in the lung bases. There appears to be a somewhat mosaic pattern to the lungs which could be due to motion artifact, edema, or air trapping. Airways appear patent. No focal consolidation demonstrated. Calcified granulomas in the lungs. No pleural effusions. No pneumothorax. Included portions of the upper abdominal organs demonstrate colonic interposition over the liver. Degenerative changes in the spine. No destructive bone lesions. Review of the MIP images confirms the above findings. IMPRESSION: No evidence of significant pulmonary embolus. Shallow inspiration. Atelectasis in the lung bases. Mosaic attenuation pattern may be due to motion artifact, edema, or air trapping. Electronically Signed   By: Lucienne Capers M.D.   On: 11/30/2015 02:43    Filed Vitals:   11/30/15 0109 11/30/15 0200  BP: 123/66 143/66  Pulse: 56 73  Temp:    Resp: 11 13    Results for orders placed or performed during the hospital encounter of 11/29/15  CBC with Differential/Platelet  Result Value Ref Range   WBC 9.9 4.0 - 10.5 K/uL   RBC 4.42 4.22 - 5.81 MIL/uL   Hemoglobin 13.2 13.0 - 17.0 g/dL   HCT 39.1 39.0 - 52.0 %   MCV 88.5 78.0 - 100.0 fL   MCH 29.9 26.0 - 34.0 pg   MCHC 33.8 30.0 - 36.0 g/dL   RDW 15.5 11.5 - 15.5 %   Platelets 113 (L) 150 - 400 K/uL   Neutrophils Relative % 77 %   Lymphocytes  Relative 17 %   Monocytes Relative 6 %   Eosinophils Relative 0 %   Basophils Relative 0 %   Band Neutrophils 0 %   Metamyelocytes Relative 0 %   Myelocytes 0 %   Promyelocytes Absolute 0 %   Blasts 0 %   nRBC 0 0 /100 WBC   Other 0 %   Neutro Abs 7.6 1.7 - 7.7 K/uL   Lymphs Abs 1.7 0.7 - 4.0 K/uL   Monocytes Absolute 0.6 0.1 - 1.0 K/uL   Eosinophils Absolute 0.0 0.0 - 0.7 K/uL   Basophils Absolute 0.0 0.0 - 0.1 K/uL   WBC Morphology MILD LEFT SHIFT (1-5% METAS, OCC MYELO, OCC BANDS)    Smear Review LARGE PLATELETS PRESENT   I-stat chem 8, ed  Result Value Ref Range   Sodium 139 135 - 145 mmol/L   Potassium 4.0 3.5 - 5.1 mmol/L   Chloride 105 101 - 111 mmol/L   BUN 27 (H) 6 - 20 mg/dL   Creatinine, Ser 1.10 0.61 - 1.24 mg/dL   Glucose, Bld 175 (H) 65 - 99 mg/dL   Calcium, Ion 1.12 (L) 1.13 - 1.30 mmol/L   TCO2 21 0 - 100 mmol/L   Hemoglobin 14.3 13.0 - 17.0 g/dL   HCT 42.0 39.0 -  52.0 %  I-stat troponin, ED  Result Value Ref Range   Troponin i, poc 0.00 0.00 - 0.08 ng/mL   Comment 3           [name] 2 View  11/30/2015  CLINICAL DATA:  Left upper abdominal pain for 1 week, worse tonight. Shortness of breath. EXAM: CHEST  2 VIEW COMPARISON:  Chest radiographs 2 days prior 11/28/2015 FINDINGS: Elevated right hemidiaphragm again seen, likely accentuated by lower lung volumes. Cardiomediastinal contours are unchanged. No pulmonary edema, focal consolidation, large pleural effusion or pneumothorax. Flowing anterior osteophytes in the thoracic spine. IMPRESSION: No change from prior exam allowing for lower lung volumes. Chronic elevation of right hemidiaphragm. Electronically Signed   By: Jeb Levering M.D.   On: 11/30/2015 00:48   [name] 2 View  11/28/2015  CLINICAL DATA:  Wheezing, cough.  Community acquired pneumonia. EXAM: CHEST  2 VIEW COMPARISON:  11/24/2015 FINDINGS: Chronic elevation of the right hemi diaphragm. No confluent opacity on the left. No effusion. Heart is  normal size. No acute bony abnormality. IMPRESSION: Chronic elevation of the right hemidiaphragm.  No active disease. Electronically Signed   By: [name] M.D.   On: 11/28/2015 09:13   [name] 2 View  11/24/2015  CLINICAL DATA:  Cough and wheezing. EXAM: CHEST  2 VIEW COMPARISON:  11/21/2015.  12/24/2014 FINDINGS: Mediastinum and hilar structures normal. Heart size stable. Minimal infiltrate in the left lung base cannot be excluded. No trauma pleural effusion. No pneumothorax. Stable elevation right hemidiaphragm. Degenerative changes thoracic spine. Interposition of colon under the right hemidiaphragm again noted. IMPRESSION: 1. Minimal infiltrate left lung base cannot be excluded. 2.  Chronic elevation right hemidiaphragm . Electronically Signed   By: [name]  Register   On: 11/24/2015 11:05   [name] 2 View  11/21/2015  CLINICAL DATA:  Cough and congestion EXAM: CHEST  2 VIEW COMPARISON:  12/24/2014 FINDINGS: Elevated right hemidiaphragm unchanged with right lower lobe atelectasis. Mild cardiac enlargement. Negative for heart failure. Left lung is clear. Negative for mass lesion. IMPRESSION: Chronic elevation right hemidiaphragm.  No acute abnormality. Electronically Signed   By: [name] M.D.   On: 11/21/2015 10:14   Ct Angio Chest Pe W/cm &/or Wo Cm  11/30/2015  CLINICAL DATA:  Increasing shortness of breath and productive cough. Recent treatment for pneumonia. EXAM: CT ANGIOGRAPHY CHEST WITH CONTRAST TECHNIQUE: Multidetector CT imaging of the chest was performed using the standard protocol during bolus administration of intravenous contrast. Multiplanar CT image reconstructions and MIPs were obtained to evaluate the vascular anatomy. CONTRAST:  100 mL Isovue 370 COMPARISON:  None. FINDINGS: Good opacification of the central and segmental pulmonary arteries. No focal filling defects identified. It no evidence of significant pulmonary embolus. Normal heart size. Normal caliber thoracic aorta.  No aortic dissection. Great vessel origins are patent. Calcification in the aorta and coronary arteries. Esophagus is decompressed. No significant lymphadenopathy in the chest. Shallow inspiration. Evaluation of lungs is limited due to respiratory motion artifact atelectasis in the lung bases. There appears to be a somewhat mosaic pattern to the lungs which could be due to motion artifact, edema, or air trapping. Airways appear patent. No focal consolidation demonstrated. Calcified granulomas in the lungs. No pleural effusions. No pneumothorax. Included portions of the upper abdominal organs demonstrate colonic interposition over the liver. Degenerative changes in the spine. No destructive bone lesions. Review of the MIP images confirms the above findings. IMPRESSION: No evidence of significant pulmonary embolus. Shallow  inspiration. Atelectasis in the lung bases. Mosaic attenuation pattern may be due to motion artifact, edema, or air trapping. Electronically Signed   By: Lucienne Capers M.D.   On: 11/30/2015 02:43    Medications  albuterol (PROVENTIL) (2.5 MG/3ML) 0.083% nebulizer solution 5 mg (not administered)  cefTRIAXone (ROCEPHIN) 1 g in dextrose 5 % 50 mL IVPB (not administered)  azithromycin (ZITHROMAX) 500 mg in dextrose 5 % 250 mL IVPB (not administered)  albuterol (PROVENTIL) (2.5 MG/3ML) 0.083% nebulizer solution 5 mg (5 mg Nebulization Given 11/30/15 0115)  ipratropium (ATROVENT) nebulizer solution 0.5 mg (0.5 mg Nebulization Given 11/30/15 0115)  methylPREDNISolone sodium succinate (SOLU-MEDROL) 125 mg/2 mL injection 125 mg (125 mg Intravenous Given 11/30/15 0052)  ketorolac (TORADOL) 30 MG/ML injection 30 mg (30 mg Intravenous Given 11/30/15 0143)  iopamidol (ISOVUE-370) 76 % injection 100 mL (100 mLs Intravenous Contrast Given 11/30/15 0223)  benzonatate (TESSALON) capsule 200 mg (200 mg Oral Given 11/30/15 0242)  loratadine (CLARITIN) tablet 10 mg (10 mg Oral Given 11/30/15 0242)  albuterol  (PROVENTIL) (2.5 MG/3ML) 0.083% nebulizer solution 5 mg (5 mg Nebulization Given 11/30/15 0243)   Plan admission  I personally performed the services described in this documentation, which was scribed in my presence. The recorded information has been reviewed and is accurate.      Veatrice Kells, MD 11/30/15 [phone]

## 2015-11-29 NOTE — Telephone Encounter (Signed)
Call patient and let him know I also sent in a medication for him to take for esophageal  reflux to make sure this is not causing some of his cough.

## 2015-11-29 NOTE — ED Notes (Signed)
Pt c/o increased SHOB with cough x 8 days, pt states he feels his airway being obstructed at times. Pt c/o L sided abd wall pain worse with cough, reports swelling in that area. continuous cough noted

## 2015-11-30 ENCOUNTER — Telehealth: Payer: Self-pay | Admitting: Emergency Medicine

## 2015-11-30 ENCOUNTER — Inpatient Hospital Stay (HOSPITAL_COMMUNITY): Payer: Medicare Other

## 2015-11-30 ENCOUNTER — Emergency Department (HOSPITAL_COMMUNITY): Payer: Medicare Other

## 2015-11-30 ENCOUNTER — Encounter (HOSPITAL_COMMUNITY): Payer: Self-pay

## 2015-11-30 DIAGNOSIS — I10 Essential (primary) hypertension: Secondary | ICD-10-CM | POA: Diagnosis not present

## 2015-11-30 DIAGNOSIS — R0600 Dyspnea, unspecified: Secondary | ICD-10-CM

## 2015-11-30 DIAGNOSIS — Z87442 Personal history of urinary calculi: Secondary | ICD-10-CM | POA: Diagnosis not present

## 2015-11-30 DIAGNOSIS — R05 Cough: Secondary | ICD-10-CM | POA: Diagnosis not present

## 2015-11-30 DIAGNOSIS — J209 Acute bronchitis, unspecified: Secondary | ICD-10-CM | POA: Diagnosis not present

## 2015-11-30 DIAGNOSIS — R0601 Orthopnea: Secondary | ICD-10-CM | POA: Diagnosis present

## 2015-11-30 DIAGNOSIS — F419 Anxiety disorder, unspecified: Secondary | ICD-10-CM | POA: Diagnosis present

## 2015-11-30 DIAGNOSIS — Z8613 Personal history of malaria: Secondary | ICD-10-CM | POA: Diagnosis not present

## 2015-11-30 DIAGNOSIS — R0902 Hypoxemia: Secondary | ICD-10-CM | POA: Diagnosis present

## 2015-11-30 DIAGNOSIS — R0602 Shortness of breath: Secondary | ICD-10-CM | POA: Diagnosis not present

## 2015-11-30 DIAGNOSIS — K219 Gastro-esophageal reflux disease without esophagitis: Secondary | ICD-10-CM | POA: Diagnosis present

## 2015-11-30 DIAGNOSIS — Z808 Family history of malignant neoplasm of other organs or systems: Secondary | ICD-10-CM | POA: Diagnosis not present

## 2015-11-30 DIAGNOSIS — I119 Hypertensive heart disease without heart failure: Secondary | ICD-10-CM | POA: Diagnosis present

## 2015-11-30 DIAGNOSIS — I2510 Atherosclerotic heart disease of native coronary artery without angina pectoris: Secondary | ICD-10-CM | POA: Diagnosis present

## 2015-11-30 DIAGNOSIS — I252 Old myocardial infarction: Secondary | ICD-10-CM | POA: Diagnosis not present

## 2015-11-30 DIAGNOSIS — J986 Disorders of diaphragm: Secondary | ICD-10-CM | POA: Diagnosis not present

## 2015-11-30 DIAGNOSIS — Z955 Presence of coronary angioplasty implant and graft: Secondary | ICD-10-CM | POA: Diagnosis not present

## 2015-11-30 DIAGNOSIS — E785 Hyperlipidemia, unspecified: Secondary | ICD-10-CM | POA: Diagnosis present

## 2015-11-30 DIAGNOSIS — Z833 Family history of diabetes mellitus: Secondary | ICD-10-CM | POA: Diagnosis not present

## 2015-11-30 DIAGNOSIS — G4733 Obstructive sleep apnea (adult) (pediatric): Secondary | ICD-10-CM | POA: Diagnosis not present

## 2015-11-30 DIAGNOSIS — J4521 Mild intermittent asthma with (acute) exacerbation: Secondary | ICD-10-CM | POA: Diagnosis not present

## 2015-11-30 DIAGNOSIS — Z8261 Family history of arthritis: Secondary | ICD-10-CM | POA: Diagnosis not present

## 2015-11-30 DIAGNOSIS — Z7982 Long term (current) use of aspirin: Secondary | ICD-10-CM | POA: Diagnosis not present

## 2015-11-30 DIAGNOSIS — Z79899 Other long term (current) drug therapy: Secondary | ICD-10-CM | POA: Diagnosis not present

## 2015-11-30 DIAGNOSIS — Z7952 Long term (current) use of systemic steroids: Secondary | ICD-10-CM | POA: Diagnosis not present

## 2015-11-30 DIAGNOSIS — Z825 Family history of asthma and other chronic lower respiratory diseases: Secondary | ICD-10-CM | POA: Diagnosis not present

## 2015-11-30 DIAGNOSIS — M459 Ankylosing spondylitis of unspecified sites in spine: Secondary | ICD-10-CM | POA: Diagnosis present

## 2015-11-30 DIAGNOSIS — Z86010 Personal history of colonic polyps: Secondary | ICD-10-CM | POA: Diagnosis not present

## 2015-11-30 DIAGNOSIS — J45909 Unspecified asthma, uncomplicated: Secondary | ICD-10-CM | POA: Diagnosis present

## 2015-11-30 LAB — CBC WITH DIFFERENTIAL/PLATELET
BAND NEUTROPHILS: 0 %
BASOS ABS: 0 10*3/uL (ref 0.0–0.1)
BLASTS: 0 %
Basophils Relative: 0 %
EOS ABS: 0 10*3/uL (ref 0.0–0.7)
Eosinophils Relative: 0 %
HCT: 39.1 % (ref 39.0–52.0)
HEMOGLOBIN: 13.2 g/dL (ref 13.0–17.0)
Lymphocytes Relative: 17 %
Lymphs Abs: 1.7 10*3/uL (ref 0.7–4.0)
MCH: 29.9 pg (ref 26.0–34.0)
MCHC: 33.8 g/dL (ref 30.0–36.0)
MCV: 88.5 fL (ref 78.0–100.0)
METAMYELOCYTES PCT: 0 %
Monocytes Absolute: 0.6 10*3/uL (ref 0.1–1.0)
Monocytes Relative: 6 %
Myelocytes: 0 %
Neutro Abs: 7.6 10*3/uL (ref 1.7–7.7)
Neutrophils Relative %: 77 %
Other: 0 %
PROMYELOCYTES ABS: 0 %
Platelets: 113 10*3/uL — ABNORMAL LOW (ref 150–400)
RBC: 4.42 MIL/uL (ref 4.22–5.81)
RDW: 15.5 % (ref 11.5–15.5)
WBC: 9.9 10*3/uL (ref 4.0–10.5)
nRBC: 0 /100 WBC

## 2015-11-30 LAB — I-STAT TROPONIN, ED: Troponin i, poc: 0 ng/mL (ref 0.00–0.08)

## 2015-11-30 LAB — ECHOCARDIOGRAM COMPLETE
Height: 67 in
Weight: 3068.8 oz

## 2015-11-30 LAB — I-STAT CHEM 8, ED
BUN: 27 mg/dL — AB (ref 6–20)
CHLORIDE: 105 mmol/L (ref 101–111)
Calcium, Ion: 1.12 mmol/L — ABNORMAL LOW (ref 1.13–1.30)
Creatinine, Ser: 1.1 mg/dL (ref 0.61–1.24)
Glucose, Bld: 175 mg/dL — ABNORMAL HIGH (ref 65–99)
HEMATOCRIT: 42 % (ref 39.0–52.0)
Hemoglobin: 14.3 g/dL (ref 13.0–17.0)
Potassium: 4 mmol/L (ref 3.5–5.1)
SODIUM: 139 mmol/L (ref 135–145)
TCO2: 21 mmol/L (ref 0–100)

## 2015-11-30 MED ORDER — BENZONATATE 100 MG PO CAPS
200.0000 mg | ORAL_CAPSULE | Freq: Once | ORAL | Status: AC
  Administered 2015-11-30: 200 mg via ORAL
  Filled 2015-11-30: qty 2

## 2015-11-30 MED ORDER — ENOXAPARIN SODIUM 40 MG/0.4ML ~~LOC~~ SOLN
40.0000 mg | SUBCUTANEOUS | Status: DC
  Administered 2015-11-30: 40 mg via SUBCUTANEOUS
  Filled 2015-11-30 (×2): qty 0.4

## 2015-11-30 MED ORDER — ASPIRIN EC 81 MG PO TBEC
81.0000 mg | DELAYED_RELEASE_TABLET | Freq: Every day | ORAL | Status: DC
  Administered 2015-11-30: 81 mg via ORAL
  Filled 2015-11-30: qty 1

## 2015-11-30 MED ORDER — PREDNISONE 20 MG PO TABS
60.0000 mg | ORAL_TABLET | Freq: Every day | ORAL | Status: DC
  Administered 2015-11-30 – 2015-12-01 (×2): 60 mg via ORAL
  Filled 2015-11-30 (×2): qty 3

## 2015-11-30 MED ORDER — ALBUTEROL SULFATE HFA 108 (90 BASE) MCG/ACT IN AERS: 2.0000 | INHALATION_SPRAY | RESPIRATORY_TRACT | Status: DC | PRN

## 2015-11-30 MED ORDER — LORATADINE 10 MG PO TABS
10.0000 mg | ORAL_TABLET | Freq: Once | ORAL | Status: AC
  Administered 2015-11-30: 10 mg via ORAL
  Filled 2015-11-30: qty 1

## 2015-11-30 MED ORDER — IBUPROFEN 200 MG PO TABS
600.0000 mg | ORAL_TABLET | Freq: Four times a day (QID) | ORAL | Status: DC | PRN
  Administered 2015-11-30: 600 mg via ORAL
  Filled 2015-11-30: qty 3

## 2015-11-30 MED ORDER — ALBUTEROL SULFATE (2.5 MG/3ML) 0.083% IN NEBU
5.0000 mg | INHALATION_SOLUTION | Freq: Once | RESPIRATORY_TRACT | Status: AC
  Administered 2015-11-30: 5 mg via RESPIRATORY_TRACT
  Filled 2015-11-30: qty 6

## 2015-11-30 MED ORDER — AMLODIPINE BESYLATE 2.5 MG PO TABS
2.5000 mg | ORAL_TABLET | Freq: Every day | ORAL | Status: DC
  Administered 2015-11-30 – 2015-12-01 (×2): 2.5 mg via ORAL
  Filled 2015-11-30 (×2): qty 1

## 2015-11-30 MED ORDER — SIMVASTATIN 20 MG PO TABS
20.0000 mg | ORAL_TABLET | Freq: Every day | ORAL | Status: DC
  Administered 2015-11-30: 20 mg via ORAL
  Filled 2015-11-30: qty 2
  Filled 2015-11-30 (×2): qty 1

## 2015-11-30 MED ORDER — DEXTROSE 5 % IV SOLN
500.0000 mg | Freq: Once | INTRAVENOUS | Status: DC
  Filled 2015-11-30: qty 500

## 2015-11-30 MED ORDER — IOPAMIDOL (ISOVUE-370) INJECTION 76%
100.0000 mL | Freq: Once | INTRAVENOUS | Status: AC | PRN
  Administered 2015-11-30: 100 mL via INTRAVENOUS

## 2015-11-30 MED ORDER — IPRATROPIUM-ALBUTEROL 0.5-2.5 (3) MG/3ML IN SOLN
3.0000 mL | Freq: Four times a day (QID) | RESPIRATORY_TRACT | Status: DC
  Administered 2015-11-30 – 2015-12-01 (×4): 3 mL via RESPIRATORY_TRACT
  Filled 2015-11-30 (×5): qty 3

## 2015-11-30 MED ORDER — KETOROLAC TROMETHAMINE 30 MG/ML IJ SOLN
30.0000 mg | Freq: Once | INTRAMUSCULAR | Status: AC
  Administered 2015-11-30: 30 mg via INTRAVENOUS
  Filled 2015-11-30: qty 1

## 2015-11-30 MED ORDER — BENZONATATE 100 MG PO CAPS
200.0000 mg | ORAL_CAPSULE | Freq: Two times a day (BID) | ORAL | Status: DC
  Administered 2015-11-30 – 2015-12-01 (×3): 200 mg via ORAL
  Filled 2015-11-30 (×3): qty 2

## 2015-11-30 MED ORDER — IPRATROPIUM BROMIDE 0.02 % IN SOLN
0.5000 mg | Freq: Once | RESPIRATORY_TRACT | Status: AC
  Administered 2015-11-30: 0.5 mg via RESPIRATORY_TRACT
  Filled 2015-11-30: qty 2.5

## 2015-11-30 MED ORDER — ALBUTEROL SULFATE (2.5 MG/3ML) 0.083% IN NEBU
2.5000 mg | INHALATION_SOLUTION | RESPIRATORY_TRACT | Status: DC | PRN
  Administered 2015-11-30: 2.5 mg via RESPIRATORY_TRACT
  Filled 2015-11-30: qty 3

## 2015-11-30 MED ORDER — ALBUTEROL SULFATE (2.5 MG/3ML) 0.083% IN NEBU: 5.0000 mg | INHALATION_SOLUTION | Freq: Once | RESPIRATORY_TRACT | Status: DC

## 2015-11-30 MED ORDER — IPRATROPIUM-ALBUTEROL 0.5-2.5 (3) MG/3ML IN SOLN: 3.0000 mL | Freq: Four times a day (QID) | RESPIRATORY_TRACT | Status: DC

## 2015-11-30 MED ORDER — PANTOPRAZOLE SODIUM 40 MG PO TBEC
80.0000 mg | DELAYED_RELEASE_TABLET | Freq: Every day | ORAL | Status: DC
  Administered 2015-11-30 – 2015-12-01 (×2): 80 mg via ORAL
  Filled 2015-11-30 (×2): qty 2

## 2015-11-30 MED ORDER — DEXTROSE 5 % IV SOLN
1.0000 g | Freq: Once | INTRAVENOUS | Status: AC
  Administered 2015-11-30: 1 g via INTRAVENOUS
  Filled 2015-11-30: qty 10

## 2015-11-30 MED ORDER — METHYLPREDNISOLONE SODIUM SUCC 125 MG IJ SOLR
125.0000 mg | Freq: Once | INTRAMUSCULAR | Status: AC
  Administered 2015-11-30: 125 mg via INTRAVENOUS
  Filled 2015-11-30: qty 2

## 2015-11-30 NOTE — Progress Notes (Signed)
RT placed patient on CPAP. Sterile water added to water chamber for humidification. Patient pressure is auto 8-20 cmH2O for patient comfort. Patient is tolerating well. RT will continue to monitor.

## 2015-11-30 NOTE — ED Notes (Signed)
Pt transported to CT ?

## 2015-11-30 NOTE — H&P (Addendum)
[facility] History and Physical  [name] [id number] DOB: 29-Aug-1940 DOA: 11/29/2015  Referring physician: EDP PCP: Leandrew Koyanagi, MD   Chief Complaint: SOB   HPI: [name] is a 75 y.o. male with h/o HTN, HLD, CAD, ITP, Ankylosing spondylitis.  Patient presents to the ED with c/o worsening SOB.  Patient has had SOB and cough for the past 1.5 weeks.  Prior to that he had intermittent episodes of cough and congestion for the past 6 months.  For this most recent episode he has been following with his PCP Dr. Everlene Farrier in the office.  Work up has included 2 negative flu swabs, negative HIV screen, 3 CXRs the first and last one being negative, the middle one being equivocal for a tiny PNA (probably negative).  Failed outpatient treatments include a course of doxycycline, course of azithromycin, a prednisone taper which he is still on at this point, neb treatments, GERD treatment.  Patient has been referred to Dr. Lenna Gilford but before he was able to see him he developed worsening SOB overnight tonight.  In ED: initially hypoxic, he did improve with breathing treatments and steroids.  Review of Systems: Systems reviewed.  As above, otherwise negative  Past Medical History  Diagnosis Date  . Coronary atherosclerosis of native coronary artery   . Hyperlipidemia     with low HDL  . Ankylosing spondylitis ([place])   . Hypertension   . Allergic rhinitis   . Cough   . Abnormal chest x-ray   . Colonic polyp   . Hemorrhoids   . Rotator cuff tear   . Ganglion cyst of wrist     left  . Other disorder of muscle, ligament, and fascia   . Anxiety   . History of malaria   . Arthritis   . Kidney stone   . Heart attack ([place]) 2008  . Swelling of lower extremity 10/16/2015   Past Surgical History  Procedure Laterality Date  . Rotator cuff repair      bilat by Dr. French Ana  . Hemorrhoid surgery  10/10    in [place]  . Left heart catheterization with coronary angiogram N/A 09/03/2012     Procedure: LEFT HEART CATHETERIZATION WITH CORONARY ANGIOGRAM;  Surgeon: [name], MD;  Location: [facility] CATH LAB;  Service: Cardiovascular;  Laterality: N/A;   Social History:  reports that he has never smoked. He has never used smokeless tobacco. He reports that he does not drink alcohol or use illicit drugs.  No Known Allergies  Family History  Problem Relation Age of Onset  . Cancer Mother     melanoma  . Emphysema Mother   . Rheum arthritis Mother   . Diabetes Mother      Prior to Admission medications   Medication Sig Start Date End Date Taking? Authorizing Provider  albuterol (PROVENTIL HFA;VENTOLIN HFA) 108 (90 Base) MCG/ACT inhaler Inhale 2 puffs into the lungs every 4 (four) hours as needed for wheezing or shortness of breath (cough, shortness of breath or wheezing.). 11/24/15  Yes [name], MD  amLODipine (NORVASC) 2.5 MG tablet Take 1 tablet (2.5 mg total) by mouth daily. 12/26/14  Yes [name], MD  aspirin 81 MG EC tablet Take 81 mg by mouth at bedtime.    Yes Historical Provider, MD  doxycycline (VIBRA-TABS) 100 MG tablet Take 1 tablet (100 mg total) by mouth 2 (two) times daily. 11/24/15  Yes [name], MD  omeprazole (PRILOSEC) 40 MG capsule Take 1  capsule (40 mg total) by mouth daily. 11/29/15  Yes [name], MD  predniSONE (DELTASONE) 10 MG tablet Take 6 tablets the first 2  Days  then 3 tablets a day for 3 days 2 tablets a day for 3 days one tablet a day for 3 days Patient taking differently: Take 60 mg by mouth daily with breakfast.  11/28/15  Yes [name], MD  simvastatin (ZOCOR) 20 MG tablet TAKE 1 TABLET EVERY DAY WITH BREAKFAST Patient taking differently: TAKE 20 MG BY MOUTH EVERY DAY WITH BREAKFAST 06/08/15  Yes [name], MD   Physical Exam: Filed Vitals:   11/30/15 0200 11/30/15 0401  BP: 143/66 122/67  Pulse: 73 70  Temp:    Resp: 13 15    BP 122/67 mmHg  Pulse 70  Temp(Src) 97.7 F (36.5 C) (Oral)  Resp 15  SpO2  92%  General Appearance:    Alert, oriented, no distress, appears stated age  Head:    Normocephalic, atraumatic  Eyes:    PERRL, EOMI, sclera non-icteric        Nose:   Nares without drainage or epistaxis. Mucosa, turbinates normal  Throat:   Moist mucous membranes. Oropharynx without erythema or exudate.  Neck:   Supple. No carotid bruits.  No thyromegaly.  No lymphadenopathy.   Back:     No CVA tenderness, no spinal tenderness  Lungs:     Clear to auscultation bilaterally, without wheezes, rhonchi or rales  Chest wall:    No tenderness to palpitation  Heart:    Regular rate and rhythm without murmurs, gallops, rubs  Abdomen:     Soft, non-tender, nondistended, normal bowel sounds, no organomegaly  Genitalia:    deferred  Rectal:    deferred  Extremities:   No clubbing, cyanosis or edema.  Pulses:   2+ and symmetric all extremities  Skin:   Skin color, texture, turgor normal, no rashes or lesions  Lymph nodes:   Cervical, supraclavicular, and axillary nodes normal  Neurologic:   CNII-XII intact. Normal strength, sensation and reflexes      throughout    Labs on Admission:  Basic Metabolic Panel:  Recent Labs Lab 11/28/15 1008 11/30/15 0035  NA 140 139  K 3.9 4.0  CL 103 105  CO2 26  --   GLUCOSE 140* 175*  BUN 16 27*  CREATININE 0.91 1.10  CALCIUM 9.2  --    Liver Function Tests:  Recent Labs Lab 11/28/15 1008  AST 21  ALT 25  ALKPHOS 58  BILITOT 0.6  PROT 6.8  ALBUMIN 4.5   No results for input(s): LIPASE, AMYLASE in the last 168 hours. No results for input(s): AMMONIA in the last 168 hours. CBC:  Recent Labs Lab 11/24/15 1032 11/25/15 0948 11/28/15 1008 11/30/15 0020 11/30/15 0035  WBC 4.5* 4.3* 7.0 9.9  --   NEUTROABS  --   --   --  7.6  --   HGB 14.6 14.2 14.1 13.2 14.3  HCT 42.2* 39.7* 40.3* 39.1 42.0  MCV 88.4 87.9 88.3 88.5  --   PLT  --   --   --  113*  --    Cardiac Enzymes: No results for input(s): CKTOTAL, CKMB, CKMBINDEX, TROPONINI  in the last 168 hours.  BNP (last 3 results) No results for input(s): PROBNP in the last 8760 hours. CBG: No results for input(s): GLUCAP in the last 168 hours.  Radiological Exams on Admission: [name] 2 View  11/30/2015  CLINICAL DATA:  Left upper abdominal pain for 1 week, worse tonight. Shortness of breath. EXAM: CHEST  2 VIEW COMPARISON:  Chest radiographs 2 days prior 11/28/2015 FINDINGS: Elevated right hemidiaphragm again seen, likely accentuated by lower lung volumes. Cardiomediastinal contours are unchanged. No pulmonary edema, focal consolidation, large pleural effusion or pneumothorax. Flowing anterior osteophytes in the thoracic spine. IMPRESSION: No change from prior exam allowing for lower lung volumes. Chronic elevation of right hemidiaphragm. Electronically Signed   By: Jeb Levering M.D.   On: 11/30/2015 00:48   [name] 2 View  11/28/2015  CLINICAL DATA:  Wheezing, cough.  Community acquired pneumonia. EXAM: CHEST  2 VIEW COMPARISON:  11/24/2015 FINDINGS: Chronic elevation of the right hemi diaphragm. No confluent opacity on the left. No effusion. Heart is normal size. No acute bony abnormality. IMPRESSION: Chronic elevation of the right hemidiaphragm.  No active disease. Electronically Signed   By: [name] M.D.   On: 11/28/2015 09:13   Ct Angio Chest Pe W/cm &/or Wo Cm  11/30/2015  CLINICAL DATA:  Increasing shortness of breath and productive cough. Recent treatment for pneumonia. EXAM: CT ANGIOGRAPHY CHEST WITH CONTRAST TECHNIQUE: Multidetector CT imaging of the chest was performed using the standard protocol during bolus administration of intravenous contrast. Multiplanar CT image reconstructions and MIPs were obtained to evaluate the vascular anatomy. CONTRAST:  100 mL Isovue 370 COMPARISON:  None. FINDINGS: Good opacification of the central and segmental pulmonary arteries. No focal filling defects identified. It no evidence of significant pulmonary embolus. Normal  heart size. Normal caliber thoracic aorta. No aortic dissection. Great vessel origins are patent. Calcification in the aorta and coronary arteries. Esophagus is decompressed. No significant lymphadenopathy in the chest. Shallow inspiration. Evaluation of lungs is limited due to respiratory motion artifact atelectasis in the lung bases. There appears to be a somewhat mosaic pattern to the lungs which could be due to motion artifact, edema, or air trapping. Airways appear patent. No focal consolidation demonstrated. Calcified granulomas in the lungs. No pleural effusions. No pneumothorax. Included portions of the upper abdominal organs demonstrate colonic interposition over the liver. Degenerative changes in the spine. No destructive bone lesions. Review of the MIP images confirms the above findings. IMPRESSION: No evidence of significant pulmonary embolus. Shallow inspiration. Atelectasis in the lung bases. Mosaic attenuation pattern may be due to motion artifact, edema, or air trapping. Electronically Signed   By: Lucienne Capers M.D.   On: 11/30/2015 02:43    EKG: Independently reviewed.  Assessment/Plan Active Problems:   Acute bronchitis   1. Acute bronchitis - 1. Symptoms improved with neb treatment and steroids in ED 2. Patient has clearly failed outpatient management and so should be admitted inpatient for futher work up 3. Will continue prednisone but at increased dose of 60mg  daily 4. Adult wheeze protocol 5. Likely warrants pulmonology consult at this point    Code Status: Full  Family Communication: Wife at bedside Disposition Plan: Admit to inpatient   Time spent: 70 min  GARDNER, JARED M. [facility] Pager [phone]  If 7AM-7PM, please contact the day team taking care of the patient [email or web address] Password [email or web address] 11/30/2015, 5:05 AM

## 2015-11-30 NOTE — Progress Notes (Signed)
Echocardiogram 2D Echocardiogram has been performed.  Tresa Res 11/30/2015, 2:52 PM

## 2015-11-30 NOTE — Telephone Encounter (Signed)
Called and spoke with patient he is currently hospitalized for COPD exacerbation. He had a CT angiogram which was negative.currently getting IV steroids and neb treatments.

## 2015-11-30 NOTE — Progress Notes (Signed)
PROGRESS NOTE  [name]  [id number] DOB: 1940/12/20  DOA: 11/29/2015 PCP: Leandrew Koyanagi, MD  Outpatient Specialists:  Cardiology: Dr. [name]  Pulmonology: Dr. Ellin Saba, new patient   Brief Narrative:  75 year old active male, PMH of CAD/MI status post stent 2008, HLD, HTN, ankylosing spondylitis, lifelong nonsmoker, seasonal allergies, symptomatic of chest symptoms for the last 6 months (chest congestion and cough), worsened since April 2 upon returning back from [place] with worsened cough, initially productive sputum which has decreased, dyspnea, orthopnea and wheezing for which he was treated with a couple of courses of antibiotics, steroid tapers and inhalers as outpatient without significant relief, referred to outpatient pulmonology (has appointment for 4/21) presented to the ED with worsening symptoms. Symptoms worsened at night. Used son's CPAP on a night with significant improvement in symptoms. Admitted for management. Pulmonology consulted.  Assessment & Plan:   Active Problems:   Acute bronchitis   Dyspnea - Unclear etiology. Has been symptomatic since 6 months but worsened since April 2 after he returned from a trip from [place]. Apparently the child couple seats back had cough and doesn't know if he picked up something. - Treated outpatient by PCP with courses of antibiotics (doxycycline, azithromycin), prednisone taper, nebulizations, GERD. He even tried his son's CPAP on one night with improvement in symptoms. - DD: Post viral acute bronchitis, reactive airway disease, asthma, COPD-felt less likely, CHF  - Pulmonology consulted: Suspect OSA (trial of auto CPAP in the hospital and outpatient sleep study), chronic right hemidiaphragm elevation with possible hypoventilation (checking sniff test, outpatient ABG, might need BiPAP at night but will need additional outpatient testing before insurance approves, ambulatory pulse oximetry),? Asthma  (duoneb, prednisone taper and will need pulmonary function test)  - CTA chest without PE.  - Has trace bilateral leg edema and orthopnea. Check 2-D echo.? Trial of Lasix.  - Incentive spirometry. Scheduled Tessalon.   CAD status post stent 2008 - Asymptomatic of chest pain. Check 2-D echo. Continue aspirin and statins  Essential hypertension - Reasonable control. Continue amlodipine  Hyperlipidemia -Continue statins   Chronic thrombocytopenia - Stable.  DVT prophylaxis: Lovenox  Code Status: Full  Family Communication: None at bedside. Discussed in detail with patient.  Disposition Plan: DC home when medically stable, possibly in the next 2-3 days.    Consultants:   Pulmonology   Procedures:   None   Antimicrobials:   IV Rocephin 1 dose on admission.    Subjective: Mostly dry cough. Symptoms worse during the night with coughing, wheezing, dyspnea, orthopnea. No chest pain reported. Feels slightly better this morning.   Objective:  Filed Vitals:   11/30/15 0200 11/30/15 0401 11/30/15 0545 11/30/15 1033  BP: 143/66 122/67 139/64 149/63  Pulse: 73 70 65   Temp:   98 F (36.7 C)   TempSrc:   Oral   Resp: 13 15 16    Height:   5\' 7"  (1.702 m)   Weight:   87 kg (191 lb 12.8 oz)   SpO2: 93% 92% 92%     Intake/Output Summary (Last 24 hours) at 11/30/15 1122 Last data filed at 11/30/15 0848  Gross per 24 hour  Intake    360 ml  Output      0 ml  Net    360 ml   Filed Weights   11/30/15 0545  Weight: 87 kg (191 lb 12.8 oz)    Examination:  General exam: Pleasant elderly male, sitting up comfortably in bed.  Respiratory system:  Clear to auscultation. Respiratory effort normal. Cardiovascular system: S1 & S2 heard, RRR.[name] No JVD, murmurs, rubs, gallops or clicks. trace bilateral pedal edema. Gastrointestinal system: Abdomen is nondistended, soft and nontender. No organomegaly or masses felt. Normal bowel sounds heard. Central nervous system: Alert and  oriented. No focal neurological deficits. Extremities: Symmetric 5 x 5 power. Skin: No rashes, lesions or ulcers Psychiatry: Judgement and insight appear normal. Mood & affect appropriate.     Data Reviewed: I have personally reviewed following labs and imaging studies  CBC:  Recent Labs Lab 11/24/15 1032 11/25/15 0948 11/28/15 1008 11/30/15 0020 11/30/15 0035  WBC 4.5* 4.3* 7.0 9.9  --   NEUTROABS  --   --   --  7.6  --   HGB 14.6 14.2 14.1 13.2 14.3  HCT 42.2* 39.7* 40.3* 39.1 42.0  MCV 88.4 87.9 88.3 88.5  --   PLT  --   --   --  113*  --    Basic Metabolic Panel:  Recent Labs Lab 11/28/15 1008 11/30/15 0035  NA 140 139  K 3.9 4.0  CL 103 105  CO2 26  --   GLUCOSE 140* 175*  BUN 16 27*  CREATININE 0.91 1.10  CALCIUM 9.2  --    GFR: Estimated Creatinine Clearance: 64 mL/min (by C-G formula based on Cr of 1.1). Liver Function Tests:  Recent Labs Lab 11/28/15 1008  AST 21  ALT 25  ALKPHOS 58  BILITOT 0.6  PROT 6.8  ALBUMIN 4.5   No results for input(s): LIPASE, AMYLASE in the last 168 hours. No results for input(s): AMMONIA in the last 168 hours. Coagulation Profile: No results for input(s): INR, PROTIME in the last 168 hours. Cardiac Enzymes: No results for input(s): CKTOTAL, CKMB, CKMBINDEX, TROPONINI in the last 168 hours. BNP (last 3 results) No results for input(s): PROBNP in the last 8760 hours. HbA1C: No results for input(s): HGBA1C in the last 72 hours. CBG: No results for input(s): GLUCAP in the last 168 hours. Lipid Profile: No results for input(s): CHOL, HDL, LDLCALC, TRIG, CHOLHDL, LDLDIRECT in the last 72 hours. Thyroid Function Tests: No results for input(s): TSH, T4TOTAL, FREET4, T3FREE, THYROIDAB in the last 72 hours. Anemia Panel: No results for input(s): VITAMINB12, FOLATE, FERRITIN, TIBC, IRON, RETICCTPCT in the last 72 hours. Urine analysis:    Component Value Date/Time   COLORURINE LT. YELLOW 06/04/2011 1024    APPEARANCEUR CLEAR 06/04/2011 1024   LABSPEC 1.025 06/04/2011 1024   PHURINE 6.0 06/04/2011 1024   GLUCOSEU 100 06/04/2011 1024   HGBUR NEGATIVE 06/04/2011 1024   BILIRUBINUR negative 11/28/2015 0920   BILIRUBINUR NEGATIVE 06/04/2011 1024   KETONESUR negative 11/28/2015 0920   KETONESUR NEGATIVE 06/04/2011 1024   PROTEINUR negative 11/28/2015 0920   UROBILINOGEN 0.2 11/28/2015 0920   UROBILINOGEN 0.2 06/04/2011 1024   NITRITE Negative 11/28/2015 0920   NITRITE NEGATIVE 06/04/2011 1024   LEUKOCYTESUR Negative 11/28/2015 0920   Sepsis Labs: @LABRCNTIP (procalcitonin:4,lacticidven:4)  )No results found for this or any previous visit (from the past 240 hour(s)).       Radiology Studies: [name] 2 View  11/30/2015  CLINICAL DATA:  Left upper abdominal pain for 1 week, worse tonight. Shortness of breath. EXAM: CHEST  2 VIEW COMPARISON:  Chest radiographs 2 days prior 11/28/2015 FINDINGS: Elevated right hemidiaphragm again seen, likely accentuated by lower lung volumes. Cardiomediastinal contours are unchanged. No pulmonary edema, focal consolidation, large pleural effusion or pneumothorax. Flowing anterior osteophytes in the thoracic spine. IMPRESSION:  No change from prior exam allowing for lower lung volumes. Chronic elevation of right hemidiaphragm. Electronically Signed   By: Jeb Levering M.D.   On: 11/30/2015 00:48   Ct Angio Chest Pe W/cm &/or Wo Cm  11/30/2015  CLINICAL DATA:  Increasing shortness of breath and productive cough. Recent treatment for pneumonia. EXAM: CT ANGIOGRAPHY CHEST WITH CONTRAST TECHNIQUE: Multidetector CT imaging of the chest was performed using the standard protocol during bolus administration of intravenous contrast. Multiplanar CT image reconstructions and MIPs were obtained to evaluate the vascular anatomy. CONTRAST:  100 mL Isovue 370 COMPARISON:  None. FINDINGS: Good opacification of the central and segmental pulmonary arteries. No focal filling defects  identified. It no evidence of significant pulmonary embolus. Normal heart size. Normal caliber thoracic aorta. No aortic dissection. Great vessel origins are patent. Calcification in the aorta and coronary arteries. Esophagus is decompressed. No significant lymphadenopathy in the chest. Shallow inspiration. Evaluation of lungs is limited due to respiratory motion artifact atelectasis in the lung bases. There appears to be a somewhat mosaic pattern to the lungs which could be due to motion artifact, edema, or air trapping. Airways appear patent. No focal consolidation demonstrated. Calcified granulomas in the lungs. No pleural effusions. No pneumothorax. Included portions of the upper abdominal organs demonstrate colonic interposition over the liver. Degenerative changes in the spine. No destructive bone lesions. Review of the MIP images confirms the above findings. IMPRESSION: No evidence of significant pulmonary embolus. Shallow inspiration. Atelectasis in the lung bases. Mosaic attenuation pattern may be due to motion artifact, edema, or air trapping. Electronically Signed   By: Lucienne Capers M.D.   On: 11/30/2015 02:43        Scheduled Meds: . amLODipine  2.5 mg Oral Daily  . aspirin EC  81 mg Oral QHS  . benzonatate  200 mg Oral BID  . enoxaparin (LOVENOX) injection  40 mg Subcutaneous Q24H  . ipratropium-albuterol  3 mL Nebulization Q6H WA  . pantoprazole  80 mg Oral Daily  . predniSONE  60 mg Oral Q breakfast  . simvastatin  20 mg Oral q1800   Continuous Infusions:    LOS: 0 days    Time spent: 30 minutes.    [facility], MD [facility] Pager [phone]  If 7PM-7AM, please contact night-coverage [email or web address] Password [email or web address] 11/30/2015, 11:22 AM

## 2015-11-30 NOTE — ED Notes (Signed)
Wife's cell [phone]

## 2015-11-30 NOTE — Consult Note (Signed)
PULMONARY / CRITICAL CARE MEDICINE   Name: [name] MRN: [id number] DOB: 1940-10-19    ADMISSION DATE:  11/29/2015 CONSULTATION DATE:  11/30/2015  REFERRING MD:  Dr. Algis Liming  CHIEF COMPLAINT:  Shortness of breath  HISTORY OF PRESENT ILLNESS:   75 yo male admitted with cough and dyspnea.  He has noticed these symptoms for the past 6 months.  His cough happens mostly at night when he lays flat.  He usually has to sleep on his side.  He will occasionally bring up clear sputum.  He denies fever or hemoptysis.  He does get wheezing at times.  His symptoms got really bad after trip to [place] earlier this month.  He says on flight back to [place] there was a child who had a constant cough, and he thinks he picked up a cold as a result.  He was tried on antibiotics, and prednisone.  He was also tried on inhaler therapy.  None of these therapies seem to help.  He does snore, and wakes up frequently with coughing spells and feels like he can't catch his breath.  His son has diagnosed with sleep disordered breathing and uses a CPAP.  He tried his son's CPAP one night, and it was the first time he could slept through the night.  He used to get allergy shots in his 64's.  After he moved to a new home, he didn't need allergy shots.  He does get seasonal allergies, especially [place]  He has never been told he has asthma.  He never smoked cigarettes, but his parents did.  He is not aware of any difficulty with reflux.  He was tried on omeprazole, but didn't notice a difference >> he only used omeprazole for one day as an outpt.  He is from [place] originally, but has lived in [place] for years.  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  He has a Programmer, systems and cockatiel bird >> he has not noticed difficulty with breathing, or cough when around animals/birds.  There is no history of pneumonia or exposure to tuberculosis.  He was in the  [employer] in the late 60's and early 70's.  In review of his chest images he has persistent elevation of his Rt hemidiaphragm since at least 2004.  He has not had surgery on his neck or chest before.  He might have sustained an injury while in the [employer], but was not aware of any problem with his diaphragm.   PAST MEDICAL HISTORY :  He  has a past medical history of Coronary atherosclerosis of native coronary artery; Hyperlipidemia; Ankylosing spondylitis ([place]); Hypertension; Allergic rhinitis; Cough; Abnormal chest x-ray; Colonic polyp; Hemorrhoids; Rotator cuff tear; Ganglion cyst of wrist; Other disorder of muscle, ligament, and fascia; Anxiety; History of malaria; Arthritis; Kidney stone; Heart attack ([place]) (2008); and Swelling of lower extremity (10/16/2015).  PAST SURGICAL HISTORY: He  has past surgical history that includes Rotator cuff repair; Hemorrhoid surgery (10/10); and left heart catheterization with coronary angiogram (N/A, 09/03/2012).  No Known Allergies  No current facility-administered medications on file prior to encounter.   Current Outpatient Prescriptions on File Prior to Encounter  Medication Sig  . albuterol (PROVENTIL HFA;VENTOLIN HFA) 108 (90 Base) MCG/ACT inhaler Inhale 2 puffs into the lungs every 4 (four) hours as needed for wheezing or shortness of breath (cough, shortness of breath or wheezing.).  [name] amLODipine (NORVASC) 2.5 MG tablet  Take 1 tablet (2.5 mg total) by mouth daily.  [name] aspirin 81 MG EC tablet Take 81 mg by mouth at bedtime.   [name] doxycycline (VIBRA-TABS) 100 MG tablet Take 1 tablet (100 mg total) by mouth 2 (two) times daily.  [name] omeprazole (PRILOSEC) 40 MG capsule Take 1 capsule (40 mg total) by mouth daily.  . predniSONE (DELTASONE) 10 MG tablet Take 6 tablets the first 2  Days  then 3 tablets a day for 3 days 2 tablets a day for 3 days one tablet a day for 3 days (Patient taking differently: Take 60 mg by mouth daily with breakfast. )  . simvastatin  (ZOCOR) 20 MG tablet TAKE 1 TABLET EVERY DAY WITH BREAKFAST (Patient taking differently: TAKE 20 MG BY MOUTH EVERY DAY WITH BREAKFAST)    FAMILY HISTORY:  His indicated that his mother is deceased. He indicated that his father is deceased. He indicated that only one of his two sisters is alive. He indicated that his maternal grandmother is alive. He indicated that his maternal grandfather is alive. He indicated that his paternal grandmother is alive. He indicated that his paternal grandfather is alive. He indicated that his daughter is alive. He indicated that his son is alive.   SOCIAL HISTORY: He  reports that he has never smoked. He has never used smokeless tobacco. He reports that he does not drink alcohol or use illicit drugs.  REVIEW OF SYSTEMS:   He has noticed a lesion on his foreskin.  He also has noticed red rash on his Rt ankle.  VITAL SIGNS: BP 139/64 mmHg  Pulse 65  Temp(Src) 98 F (36.7 C) (Oral)  Resp 16  Ht 5\' 7"  (1.702 m)  Wt 191 lb 12.8 oz (87 kg)  BMI 30.03 kg/m2  SpO2 92%  INTAKE / OUTPUT:    PHYSICAL EXAMINATION: General: pleasant Neuro:  Alert, normal strength, CN intact HEENT:  Pupils reactive, MP 3, 2+ tonsils, no LAN Cardiovascular:  Regular, no murmur Lungs:  No wheeze, decreased BS at Rt base Abdomen:  Soft, non tender, no organomegaly Musculoskeletal:  No edema, cyanosis, clubbing Skin:  Faint red rash on Rt ankle  LABS:  BMET  Recent Labs Lab 11/28/15 1008 11/30/15 0035  NA 140 139  K 3.9 4.0  CL 103 105  CO2 26  --   BUN 16 27*  CREATININE 0.91 1.10  GLUCOSE 140* 175*    Electrolytes  Recent Labs Lab 11/28/15 1008  CALCIUM 9.2    CBC  Recent Labs Lab 11/25/15 0948 11/28/15 1008 11/30/15 0020 11/30/15 0035  WBC 4.3* 7.0 9.9  --   HGB 14.2 14.1 13.2 14.3  HCT 39.7* 40.3* 39.1 42.0  PLT  --   --  113*  --    Liver Enzymes  Recent Labs Lab 11/28/15 1008  AST 21  ALT 25  ALKPHOS 58  BILITOT 0.6  ALBUMIN 4.5     Imaging [name] 2 View  11/30/2015  CLINICAL DATA:  Left upper abdominal pain for 1 week, worse tonight. Shortness of breath. EXAM: CHEST  2 VIEW COMPARISON:  Chest radiographs 2 days prior 11/28/2015 FINDINGS: Elevated right hemidiaphragm again seen, likely accentuated by lower lung volumes. Cardiomediastinal contours are unchanged. No pulmonary edema, focal consolidation, large pleural effusion or pneumothorax. Flowing anterior osteophytes in the thoracic spine. IMPRESSION: No change from prior exam allowing for lower lung volumes. Chronic elevation of right hemidiaphragm. Electronically Signed   By: Jeb Levering M.D.   On:  11/30/2015 00:48   Ct Angio Chest Pe W/cm &/or Wo Cm  11/30/2015  CLINICAL DATA:  Increasing shortness of breath and productive cough. Recent treatment for pneumonia. EXAM: CT ANGIOGRAPHY CHEST WITH CONTRAST TECHNIQUE: Multidetector CT imaging of the chest was performed using the standard protocol during bolus administration of intravenous contrast. Multiplanar CT image reconstructions and MIPs were obtained to evaluate the vascular anatomy. CONTRAST:  100 mL Isovue 370 COMPARISON:  None. FINDINGS: Good opacification of the central and segmental pulmonary arteries. No focal filling defects identified. It no evidence of significant pulmonary embolus. Normal heart size. Normal caliber thoracic aorta. No aortic dissection. Great vessel origins are patent. Calcification in the aorta and coronary arteries. Esophagus is decompressed. No significant lymphadenopathy in the chest. Shallow inspiration. Evaluation of lungs is limited due to respiratory motion artifact atelectasis in the lung bases. There appears to be a somewhat mosaic pattern to the lungs which could be due to motion artifact, edema, or air trapping. Airways appear patent. No focal consolidation demonstrated. Calcified granulomas in the lungs. No pleural effusions. No pneumothorax. Included portions of the upper abdominal  organs demonstrate colonic interposition over the liver. Degenerative changes in the spine. No destructive bone lesions. Review of the MIP images confirms the above findings. IMPRESSION: No evidence of significant pulmonary embolus. Shallow inspiration. Atelectasis in the lung bases. Mosaic attenuation pattern may be due to motion artifact, edema, or air trapping. Electronically Signed   By: Lucienne Capers M.D.   On: 11/30/2015 02:43     STUDIES:  09/27/02 CXR >> elevated Rt hemidiaphragm 10/16/15 Doppler legs b/l >> no DVT 11/30/15 CT angio chest >> motion artifact, mosaic pattern, calcified granuloma  SIGNIFICANT EVENTS: 4/19 Admit   DISCUSSION: 75 yo male with progressive dyspnea, and intermittent cough that happens mostly at night.  He also has snoring, and sleep disruption.  He has prior history of allergies.  He has persistent Rt hemidiaphragm elevation present since at least 2004.  He likely has sleep disordered breathing related to obstructive sleep apnea, and possible hypoventilation related to chronic right diaphragm dysfunction.  He might also have a degree of intermittent asthma with allergy/irritant triggers.  PMHx of CAD, HLD, HTN, Ankylosing spondylitis, Anxiety, Nephrolithiasis   ASSESSMENT / PLAN:  Presumed obstructive sleep apnea. - try on auto CPAP at night while in hospital - will need outpt sleep study to further assess  Chronic right diaphragm elevation with possible hypoventilation. - check SNIFF test - will need outpt room air ABG to further assess for hypoventilation - might need BiPAP at night >> would need additional outpt testing before he could get this approved by insurance - ambulatory oximetry  Intermittent cough with ?of asthma. - continue duoneb and prednisone taper - will need further assessment as outpt with pulmonary function testing  Hx of CAD. - f/u Echo  Rt ankle rash. Penile lesion. - per primary team   Chesley Mires, MD [facility]  Pulmonary/Critical Care 11/30/2015, 10:24 AM Pager:  [phone] After 3pm call: [phone]

## 2015-12-01 ENCOUNTER — Ambulatory Visit: Payer: Medicare Other | Admitting: Pulmonary Disease

## 2015-12-01 ENCOUNTER — Telehealth: Payer: Self-pay | Admitting: Pulmonary Disease

## 2015-12-01 DIAGNOSIS — I10 Essential (primary) hypertension: Secondary | ICD-10-CM

## 2015-12-01 DIAGNOSIS — G4733 Obstructive sleep apnea (adult) (pediatric): Secondary | ICD-10-CM

## 2015-12-01 DIAGNOSIS — J4521 Mild intermittent asthma with (acute) exacerbation: Secondary | ICD-10-CM

## 2015-12-01 MED ORDER — BENZONATATE 200 MG PO CAPS: 200.0000 mg | ORAL_CAPSULE | Freq: Two times a day (BID) | ORAL | Status: DC

## 2015-12-01 MED ORDER — PREDNISONE 10 MG PO TABS: ORAL_TABLET | ORAL | Status: DC

## 2015-12-01 NOTE — Consult Note (Signed)
PULMONARY / CRITICAL CARE MEDICINE   Name: [name] MRN: [id number] DOB: Jun 25, 1941    ADMISSION DATE:  11/29/2015 CONSULTATION DATE:  11/30/2015  REFERRING MD:  Dr. Algis Liming  CHIEF COMPLAINT:  Shortness of breath  SUBJECTIVE: Slept well with CPAP.  No issues with cough, congestion, or dyspnea.  VITAL SIGNS: BP 133/53 mmHg  Pulse 81  Temp(Src) 98.2 F (36.8 C) (Oral)  Resp 20  Ht 5\' 7"  (1.702 m)  Wt 191 lb 12.8 oz (87 kg)  BMI 30.03 kg/m2  SpO2 96%  INTAKE / OUTPUT: I/O last 3 completed shifts: In: 600 [P.O.:600] Out: -   PHYSICAL EXAMINATION: General: pleasant Neuro:  Alert, normal strength HEENT: MP 3, 2+ tonsils Cardiovascular:  Regular, no murmur Lungs:  No wheeze, decreased BS at Rt base Abdomen:  Soft, non tender Musculoskeletal:  No edema Skin:  Faint red rash on Rt ankle >> improved  LABS:  BMET  Recent Labs Lab 11/28/15 1008 11/30/15 0035  NA 140 139  K 3.9 4.0  CL 103 105  CO2 26  --   BUN 16 27*  CREATININE 0.91 1.10  GLUCOSE 140* 175*    Electrolytes  Recent Labs Lab 11/28/15 1008  CALCIUM 9.2    CBC  Recent Labs Lab 11/25/15 0948 11/28/15 1008 11/30/15 0020 11/30/15 0035  WBC 4.3* 7.0 9.9  --   HGB 14.2 14.1 13.2 14.3  HCT 39.7* 40.3* 39.1 42.0  PLT  --   --  113*  --    Liver Enzymes  Recent Labs Lab 11/28/15 1008  AST 21  ALT 25  ALKPHOS 58  BILITOT 0.6  ALBUMIN 4.5    Imaging Dg Sniff Test  11/30/2015  CLINICAL DATA:  Dyspnea. Evaluate for paralysis of the right hemidiaphragm. EXAM: CHEST FLUOROSCOPY TECHNIQUE: Real-time fluoroscopic evaluation of the chest was performed. FLUOROSCOPY TIME:  1.1 minute COMPARISON:  Prior chest x-rays. FINDINGS: There is marked elevation of the right hemidiaphragm compared to the left. The right hemidiaphragm does move with breathing and has neck pain. IMPRESSION: Chronic elevation right hemidiaphragm but no evidence of diaphragmatic paralysis. Electronically Signed   By:  Marijo Sanes M.D.   On: 11/30/2015 12:55     STUDIES:  09/27/02 CXR >> elevated Rt hemidiaphragm 10/16/15 Doppler legs b/l >> no DVT 11/30/15 CT angio chest >> motion artifact, mosaic pattern, calcified granuloma 11/30/15 SNIFF test >> elevated Rt diaphragm, but has b/l diaphragm movement 11/30/15 Echo >> EF 65 to [id number], grade 1 diastolic CHF  SIGNIFICANT EVENTS: 4/19 Admit 4/20 Trial of CPAP  DISCUSSION: 75 yo male with dyspnea, intermittent cough >> mostly nocturnal symptoms.  Likely has undiagnosed OSA.  Might also have allergy/irritant induced asthma.  PMHx of CAD, HLD, HTN, Ankylosing spondylitis, Anxiety, Nephrolithiasis.   ASSESSMENT / PLAN:  Presumed obstructive sleep apnea. - will arrange for outpt sleep study - he should continue with auto CPAP range 5 to 20 cm H2O with heated humidity and mask of choice >> hopefully case manager can arrange for this now while he awaits sleep study   Chronic Rt hemidiaphragm elevation >> has movement on SNIFF study.  Normal HCO3 on BMET speaks against chronic hypoventilation/hypercapnia. - monitor clinically  Intermittent cough with ?of asthma. - prn BDs - rapid prednisone taper - will need further assessment as outpt with pulmonary function testing  Okay for d/c home.  Will call him to schedule outpt sleep study, and arrange for follow up visit after sleep study reviewed.  D/w Dr.  Hongalgi.   Chesley Mires, MD [facility] Pulmonary/Critical Care 12/01/2015, 9:47 AM Pager:  [phone] After 3pm call: [phone]

## 2015-12-01 NOTE — Progress Notes (Signed)
[facility]   [facility] is providing the following services: CPAP  If patient discharges after hours, please call [phone].   Linward Headland 12/01/2015, 12:47 PM

## 2015-12-01 NOTE — Discharge Summary (Signed)
Physician Discharge Summary  [name]  [id number]  DOB: 1940-12-05  DOA: 11/29/2015  PCP: Leandrew Koyanagi, MD  Outpatient Specialists: Cardiology: Dr. [name]  Pulmonology: Dr. Ellin Saba, new patient/Dr. Chesley Mires.   Admit date: 11/29/2015 Discharge date: 12/01/2015  Time spent: Greater than 30 minutes  Recommendations for Outpatient Follow-up:  1. Dr. Tami Lin, PCP in 1 week 2. Dr. Chesley Mires, Pulmonology: Will arrange outpatient sleep study and M.D. follow-up.  Discharge Diagnoses:  Principal Problem:   Obstructive sleep apnea Active Problems:   Acute bronchitis   Discharge Condition: Improved & Stable  Diet recommendation: Heart healthy diet.  Filed Weights   11/30/15 0545  Weight: 87 kg (191 lb 12.8 oz)    History of present illness:  75 year old active male, PMH of CAD/MI status post stent 2008, HLD, HTN, ankylosing spondylitis, lifelong nonsmoker, seasonal allergies, symptomatic of chest symptoms for the last 6 months (chest congestion and cough), worsened since April 2 upon returning back from [place] with worsened cough, initially productive sputum which has decreased, dyspnea, orthopnea and wheezing for which he was treated with a couple of courses of antibiotics, steroid tapers and inhalers as outpatient without significant relief, referred to outpatient pulmonology (has appointment for 4/21) presented to the ED with worsening symptoms. Symptoms worsened at night. Used son's CPAP on a night with significant improvement in symptoms. Admitted for management. Pulmonology consulted.  Hospital Course:   Dyspnea suspected due to undiagnosed OSA. - Has been symptomatic since 6 months but worsened since April 2 after he returned from a trip from [place]. Apparently a child couple seats back on the flight had cough and doesn't know if he picked up something. - Treated outpatient by PCP with courses of antibiotics (doxycycline,  azithromycin), prednisone taper, nebulizations, GERD. He even tried his son's CPAP on one night with improvement in symptoms. - DD: Post viral acute bronchitis, reactive airway disease, asthma, COPD-felt less likely, CHF  - Pulmonology consulted: Suspect OSA (trial of auto CPAP in the hospital and outpatient sleep study), chronic right hemidiaphragm elevation with possible hypoventilation (checking sniff test, outpatient ABG, might need BiPAP at night but will need additional outpatient testing before insurance approves, ambulatory pulse oximetry),? Asthma (duoneb, prednisone taper and will need pulmonary function test)  - CTA chest without PE.  - Has trace bilateral leg edema and orthopnea. 2-D echo unremarkable. - Tried CPAP overnight last night with good results. Stated that he slept well through the course of the night. This morning denies complaints. - Discussed with Dr. Halford Chessman and reviewed his follow-up note: Presumed OSA-okay to discharge home on CPAP with auto settings (case management consulted), he will arrange outpatient sleep study and follow-up. Chronic right hemidiaphragm elevation-negative sniff test. He recommends continued when necessary bronchodilator inhaler for ? Asthma, rapid prednisone taper, outpatient PFT and no need for further antibiotics.  CAD status post stent 2008 - Asymptomatic of chest pain. 2-D echo unremarkable-results as below. Continue aspirin and statins  Essential hypertension - Reasonable control. Continue amlodipine  Hyperlipidemia -Continue statins   Chronic thrombocytopenia - Stable.   Consultants:   Pulmonology  Procedures:   CPAP at [facility]  Discharge Exam:  Complaints:  Used CPAP last night with good results. States that he slept well through the course of the night. This morning denies dyspnea, cough or any other complaints. Chest pain with coughing has significantly improved.  Filed Vitals:   11/30/15 1400 11/30/15 2142 12/01/15 0604  12/01/15 0759  BP: 143/61 133/53  Pulse: 80 81    Temp: 98.3 F (36.8 C) 98.2 F (36.8 C)    TempSrc: Oral Oral    Resp: 20 20    Height:      Weight:      SpO2: 94% 94% 97% 96%    General exam: Pleasant elderly male, ambulating comfortably in the room. Respiratory system: Clear to auscultation. Respiratory effort normal. Cardiovascular system: S1 & S2 heard, RRR.[name] No JVD, murmurs, rubs, gallops or clicks. trace bilateral pedal edema. Gastrointestinal system: Abdomen is nondistended, soft and nontender. No organomegaly or masses felt. Normal bowel sounds heard. Central nervous system: Alert and oriented. No focal neurological deficits. Extremities: Symmetric 5 x 5 power. Skin: No rashes, lesions or ulcers Psychiatry: Judgement and insight appear normal. Mood & affect appropriate.   Discharge Instructions      Discharge Instructions    Activity as tolerated - No restrictions    Complete by:  As directed      Call MD for:  difficulty breathing, headache or visual disturbances    Complete by:  As directed      Call MD for:  extreme fatigue    Complete by:  As directed      Call MD for:  persistant dizziness or light-headedness    Complete by:  As directed      Call MD for:  persistant nausea and vomiting    Complete by:  As directed      Call MD for:  severe uncontrolled pain    Complete by:  As directed      Call MD for:  temperature >100.4    Complete by:  As directed      Diet - low sodium heart healthy    Complete by:  As directed      Discharge instructions    Complete by:  As directed   CPAP at bedtime: auto CPAP range 5 to 20 cm H2O with heated humidity and mask of choice.            Medication List    STOP taking these medications        doxycycline 100 MG tablet  Commonly known as:  VIBRA-TABS      TAKE these medications        albuterol 108 (90 Base) MCG/ACT inhaler  Commonly known as:  PROVENTIL HFA;VENTOLIN HFA  Inhale 2 puffs into the lungs  every 4 (four) hours as needed for wheezing or shortness of breath (cough, shortness of breath or wheezing.).     amLODipine 2.5 MG tablet  Commonly known as:  NORVASC  Take 1 tablet (2.5 mg total) by mouth daily.     aspirin 81 MG EC tablet  Take 81 mg by mouth at bedtime.     benzonatate 200 MG capsule  Commonly known as:  TESSALON  Take 1 capsule (200 mg total) by mouth 2 (two) times daily.     omeprazole 40 MG capsule  Commonly known as:  PRILOSEC  Take 1 capsule (40 mg total) by mouth daily.     predniSONE 10 MG tablet  Commonly known as:  DELTASONE  Take 5 tabs on 12/02/15 then reduce by one tablet every day to zero and stop.  Start taking on:  12/02/2015     simvastatin 20 MG tablet  Commonly known as:  ZOCOR  TAKE 1 TABLET EVERY DAY WITH BREAKFAST       Follow-up Information    Follow up with DOOLITTLE, ROBERT P,  MD. Schedule an appointment as soon as possible for a visit in 1 week.   Specialties:  Internal Medicine, Adolescent Medicine   Contact information:   [place] [id number] [phone]       Follow up with Chesley Mires, MD.   Specialty:  Pulmonary Disease   Why:  MDs office will call with appointment for outpatient sleep study and M.D. follow-up.   Contact information:   [address] [place] [address] [phone]       Get Medicines reviewed and adjusted: Please take all your medications with you for your next visit with your Primary MD  Please request your Primary MD to go over all hospital tests and procedure/radiological results at the follow up. Please ask your Primary MD to get all Hospital records sent to his/her office.  If you experience worsening of your admission symptoms, develop shortness of breath, life threatening emergency, suicidal or homicidal thoughts you must seek medical attention immediately by calling 911 or calling your MD immediately if symptoms less severe.  You must read complete  instructions/literature along with all the possible adverse reactions/side effects for all the Medicines you take and that have been prescribed to you. Take any new Medicines after you have completely understood and accept all the possible adverse reactions/side effects.   Do not drive when taking pain medications.   Do not take more than prescribed Pain, Sleep and Anxiety Medications  Special Instructions: If you have smoked or chewed Tobacco in the last 2 yrs please stop smoking, stop any regular Alcohol and or any Recreational drug use.  Wear Seat belts while driving.  Please note  You were cared for by a hospitalist during your hospital stay. Once you are discharged, your primary care physician will handle any further medical issues. Please note that NO REFILLS for any discharge medications will be authorized once you are discharged, as it is imperative that you return to your primary care physician (or establish a relationship with a primary care physician if you do not have one) for your aftercare needs so that they can reassess your need for medications and monitor your lab values.    The results of significant diagnostics from this hospitalization (including imaging, microbiology, ancillary and laboratory) are listed below for reference.    Significant Diagnostic Studies: [name] 2 View  11/30/2015  CLINICAL DATA:  Left upper abdominal pain for 1 week, worse tonight. Shortness of breath. EXAM: CHEST  2 VIEW COMPARISON:  Chest radiographs 2 days prior 11/28/2015 FINDINGS: Elevated right hemidiaphragm again seen, likely accentuated by lower lung volumes. Cardiomediastinal contours are unchanged. No pulmonary edema, focal consolidation, large pleural effusion or pneumothorax. Flowing anterior osteophytes in the thoracic spine. IMPRESSION: No change from prior exam allowing for lower lung volumes. Chronic elevation of right hemidiaphragm. Electronically Signed   By: Jeb Levering M.D.    On: 11/30/2015 00:48   [name] 2 View  11/28/2015  CLINICAL DATA:  Wheezing, cough.  Community acquired pneumonia. EXAM: CHEST  2 VIEW COMPARISON:  11/24/2015 FINDINGS: Chronic elevation of the right hemi diaphragm. No confluent opacity on the left. No effusion. Heart is normal size. No acute bony abnormality. IMPRESSION: Chronic elevation of the right hemidiaphragm.  No active disease. Electronically Signed   By: [name] M.D.   On: 11/28/2015 09:13   [name] 2 View  11/24/2015  CLINICAL DATA:  Cough and wheezing. EXAM: CHEST  2 VIEW COMPARISON:  11/21/2015.  12/24/2014 FINDINGS: Mediastinum  and hilar structures normal. Heart size stable. Minimal infiltrate in the left lung base cannot be excluded. No trauma pleural effusion. No pneumothorax. Stable elevation right hemidiaphragm. Degenerative changes thoracic spine. Interposition of colon under the right hemidiaphragm again noted. IMPRESSION: 1. Minimal infiltrate left lung base cannot be excluded. 2.  Chronic elevation right hemidiaphragm . Electronically Signed   By: [name]  Register   On: 11/24/2015 11:05   [name] 2 View  11/21/2015  CLINICAL DATA:  Cough and congestion EXAM: CHEST  2 VIEW COMPARISON:  12/24/2014 FINDINGS: Elevated right hemidiaphragm unchanged with right lower lobe atelectasis. Mild cardiac enlargement. Negative for heart failure. Left lung is clear. Negative for mass lesion. IMPRESSION: Chronic elevation right hemidiaphragm.  No acute abnormality. Electronically Signed   By: [name] M.D.   On: 11/21/2015 10:14   Ct Angio Chest Pe W/cm &/or Wo Cm  11/30/2015  CLINICAL DATA:  Increasing shortness of breath and productive cough. Recent treatment for pneumonia. EXAM: CT ANGIOGRAPHY CHEST WITH CONTRAST TECHNIQUE: Multidetector CT imaging of the chest was performed using the standard protocol during bolus administration of intravenous contrast. Multiplanar CT image reconstructions and MIPs were obtained to evaluate the  vascular anatomy. CONTRAST:  100 mL Isovue 370 COMPARISON:  None. FINDINGS: Good opacification of the central and segmental pulmonary arteries. No focal filling defects identified. It no evidence of significant pulmonary embolus. Normal heart size. Normal caliber thoracic aorta. No aortic dissection. Great vessel origins are patent. Calcification in the aorta and coronary arteries. Esophagus is decompressed. No significant lymphadenopathy in the chest. Shallow inspiration. Evaluation of lungs is limited due to respiratory motion artifact atelectasis in the lung bases. There appears to be a somewhat mosaic pattern to the lungs which could be due to motion artifact, edema, or air trapping. Airways appear patent. No focal consolidation demonstrated. Calcified granulomas in the lungs. No pleural effusions. No pneumothorax. Included portions of the upper abdominal organs demonstrate colonic interposition over the liver. Degenerative changes in the spine. No destructive bone lesions. Review of the MIP images confirms the above findings. IMPRESSION: No evidence of significant pulmonary embolus. Shallow inspiration. Atelectasis in the lung bases. Mosaic attenuation pattern may be due to motion artifact, edema, or air trapping. Electronically Signed   By: Lucienne Capers M.D.   On: 11/30/2015 02:43   Dg Sniff Test  11/30/2015  CLINICAL DATA:  Dyspnea. Evaluate for paralysis of the right hemidiaphragm. EXAM: CHEST FLUOROSCOPY TECHNIQUE: Real-time fluoroscopic evaluation of the chest was performed. FLUOROSCOPY TIME:  1.1 minute COMPARISON:  Prior chest x-rays. FINDINGS: There is marked elevation of the right hemidiaphragm compared to the left. The right hemidiaphragm does move with breathing and has neck pain. IMPRESSION: Chronic elevation right hemidiaphragm but no evidence of diaphragmatic paralysis. Electronically Signed   By: Marijo Sanes M.D.   On: 11/30/2015 12:55    Microbiology: No results found for this or  any previous visit (from the past 240 hour(s)).   Labs: Basic Metabolic Panel:  Recent Labs Lab 11/28/15 1008 11/30/15 0035  NA 140 139  K 3.9 4.0  CL 103 105  CO2 26  --   GLUCOSE 140* 175*  BUN 16 27*  CREATININE 0.91 1.10  CALCIUM 9.2  --    Liver Function Tests:  Recent Labs Lab 11/28/15 1008  AST 21  ALT 25  ALKPHOS 58  BILITOT 0.6  PROT 6.8  ALBUMIN 4.5   No results for input(s): LIPASE, AMYLASE in the last 168 hours. No results  for input(s): AMMONIA in the last 168 hours. CBC:  Recent Labs Lab 11/24/15 1032 11/25/15 0948 11/28/15 1008 11/30/15 0020 11/30/15 0035  WBC 4.5* 4.3* 7.0 9.9  --   NEUTROABS  --   --   --  7.6  --   HGB 14.6 14.2 14.1 13.2 14.3  HCT 42.2* 39.7* 40.3* 39.1 42.0  MCV 88.4 87.9 88.3 88.5  --   PLT  --   --   --  113*  --    Cardiac Enzymes: No results for input(s): CKTOTAL, CKMB, CKMBINDEX, TROPONINI in the last 168 hours. BNP: BNP (last 3 results) No results for input(s): BNP in the last 8760 hours.  ProBNP (last 3 results) No results for input(s): PROBNP in the last 8760 hours.  CBG: No results for input(s): GLUCAP in the last 168 hours.     Signed:  Vernell Leep, MD, FACP, FHM. [facility] Pager [phone]  If 7PM-7AM, please contact night-coverage [email or web address] Password [facility] 12/01/2015, 10:27 AM

## 2015-12-01 NOTE — Telephone Encounter (Signed)
Patient called, CB is [phone], [phone].  [facility] rep came to the hospital and will deliver machine this evening.  He states he was told there is a charge during the time he uses the machine until sleep study is done and authorization is recd.  So he is requesting this be done as soon as possible. Patient is very anxious and would like a phone call this afternoon since it is nearing the weekend.

## 2015-12-01 NOTE — Progress Notes (Signed)
This CM was alerted by MD that pt would need CPAP for home.  [facility] DME rep contacted for referral. Orders received from MD.  Pt informed that [facility] would deliver CPAP to home.  No other CM needs communicated. Marney Doctor RN,BSN,NCM [phone]

## 2015-12-01 NOTE — Telephone Encounter (Signed)
I saw this patient in hospital.  He will be discharged home 12/01/15.  Please arrange for home sleep study once he is discharged >> diagnosis hypersomnia, snoring.  Please confirm with his insurance that he does not need outpt face to face evaluation prior to having home sleep study done >> if he needs face to face first, then schedule him for ROV with me on 12/05/15.

## 2015-12-01 NOTE — Discharge Instructions (Signed)
Sleep Apnea  Sleep apnea is a sleep disorder characterized by abnormal pauses in breathing while you sleep. When your breathing pauses, the level of oxygen in your blood decreases. This causes you to move out of deep sleep and into light sleep. As a result, your quality of sleep is poor, and the system that carries your blood throughout your body (cardiovascular system) experiences stress. If sleep apnea remains untreated, the following conditions can develop:  High blood pressure (hypertension).  Coronary artery disease.  Inability to achieve or maintain an erection (impotence).  Impairment of your thought process (cognitive dysfunction). There are three types of sleep apnea: 1. Obstructive sleep apnea--Pauses in breathing during sleep because of a blocked airway. 2. Central sleep apnea--Pauses in breathing during sleep because the area of the brain that controls your breathing does not send the correct signals to the muscles that control breathing. 3. Mixed sleep apnea--A combination of both obstructive and central sleep apnea. RISK FACTORS The following risk factors can increase your risk of developing sleep apnea:  Being overweight.  Smoking.  Having narrow passages in your nose and throat.  Being of older age.  Being male.  Alcohol use.  Sedative and tranquilizer use.  Ethnicity. Among individuals younger than 35 years, African Americans are at increased risk of sleep apnea. SYMPTOMS   Difficulty staying asleep.  Daytime sleepiness and fatigue.  Loss of energy.  Irritability.  Loud, heavy snoring.  Morning headaches.  Trouble concentrating.  Forgetfulness.  Decreased interest in sex.  Unexplained sleepiness. DIAGNOSIS  In order to diagnose sleep apnea, your caregiver will perform a physical examination. A sleep study done in the comfort of your own home may be appropriate if you are otherwise healthy. Your caregiver may also recommend that you spend the  night in a sleep lab. In the sleep lab, several monitors record information about your heart, lungs, and brain while you sleep. Your leg and arm movements and blood oxygen level are also recorded. TREATMENT The following actions may help to resolve mild sleep apnea:  Sleeping on your side.   Using a decongestant if you have nasal congestion.   Avoiding the use of depressants, including alcohol, sedatives, and narcotics.   Losing weight and modifying your diet if you are overweight. There also are devices and treatments to help open your airway:  Oral appliances. These are custom-made mouthpieces that shift your lower jaw forward and slightly open your bite. This opens your airway.  Devices that create positive airway pressure. This positive pressure "splints" your airway open to help you breathe better during sleep. The following devices create positive airway pressure:  Continuous positive airway pressure (CPAP) device. The CPAP device creates a continuous level of air pressure with an air pump. The air is delivered to your airway through a mask while you sleep. This continuous pressure keeps your airway open.  Nasal expiratory positive airway pressure (EPAP) device. The EPAP device creates positive air pressure as you exhale. The device consists of single-use valves, which are inserted into each nostril and held in place by adhesive. The valves create very little resistance when you inhale but create much more resistance when you exhale. That increased resistance creates the positive airway pressure. This positive pressure while you exhale keeps your airway open, making it easier to breath when you inhale again.  Bilevel positive airway pressure (BPAP) device. The BPAP device is used mainly in patients with central sleep apnea. This device is similar to the CPAP device because   it also uses an air pump to deliver continuous air pressure through a mask. However, with the BPAP machine, the  pressure is set at two different levels. The pressure when you exhale is lower than the pressure when you inhale.  Surgery. Typically, surgery is only done if you cannot comply with less invasive treatments or if the less invasive treatments do not improve your condition. Surgery involves removing excess tissue in your airway to create a wider passage way.   This information is not intended to replace advice given to you by your health care provider. Make sure you discuss any questions you have with your health care provider.   Document Released: 07/19/2002 Document Revised: 08/19/2014 Document Reviewed: 12/05/2011 Elsevier Interactive Patient Education 2016 [employer]   

## 2015-12-01 NOTE — Telephone Encounter (Signed)
Patient states that he has been released from hospital and [facility] will be bringing out a CPAP machine for him to use until he can be tested.  Patient requesting to be tests ASAP because he doesn't want to be charged for the usage of the CPAP machine.  Advised patient that I would put in order for him to have HST now that he has been discharged and that our [facility] will call him as soon as possible to get the HST scheduled so we can fax the results to [facility] ASAP.  Advised patient that if his insurance does not cover the HST, we would have to order a lab study.   Order entered. Patient aware. Nothing further needed.

## 2015-12-04 LAB — RESPIRATORY VIRUS PANEL
Adenovirus: NEGATIVE
Influenza A: NEGATIVE
Influenza B: NEGATIVE
Metapneumovirus: NEGATIVE
Parainfluenza 1: NEGATIVE
Parainfluenza 2: NEGATIVE
Parainfluenza 3: NEGATIVE
Respiratory Syncytial Virus A: POSITIVE — AB
Respiratory Syncytial Virus B: NEGATIVE
Rhinovirus: NEGATIVE

## 2015-12-08 ENCOUNTER — Telehealth: Payer: Self-pay | Admitting: Pulmonary Disease

## 2015-12-08 NOTE — Telephone Encounter (Signed)
Called spoke with pt. Pt states he was returning a call from our office. I explained to him that [facility] called him earlier to inform him that it will be the first of the week before he will be scheduled for a HST. He voiced understanding and had no further questions. Nothing further needed.

## 2015-12-08 NOTE — Telephone Encounter (Signed)
Spoke to pt [name] is aware he will be called 1st of the week of 12/11/15 [name]

## 2015-12-12 DIAGNOSIS — H52223 Regular astigmatism, bilateral: Secondary | ICD-10-CM | POA: Diagnosis not present

## 2015-12-12 DIAGNOSIS — H43813 Vitreous degeneration, bilateral: Secondary | ICD-10-CM | POA: Diagnosis not present

## 2015-12-12 DIAGNOSIS — H04123 Dry eye syndrome of bilateral lacrimal glands: Secondary | ICD-10-CM | POA: Diagnosis not present

## 2015-12-12 DIAGNOSIS — H2513 Age-related nuclear cataract, bilateral: Secondary | ICD-10-CM | POA: Diagnosis not present

## 2015-12-12 DIAGNOSIS — H524 Presbyopia: Secondary | ICD-10-CM | POA: Diagnosis not present

## 2015-12-12 DIAGNOSIS — H353131 Nonexudative age-related macular degeneration, bilateral, early dry stage: Secondary | ICD-10-CM | POA: Diagnosis not present

## 2015-12-18 ENCOUNTER — Ambulatory Visit (INDEPENDENT_AMBULATORY_CARE_PROVIDER_SITE_OTHER): Payer: Medicare Other | Admitting: Cardiovascular Disease

## 2015-12-18 ENCOUNTER — Encounter: Payer: Self-pay | Admitting: Cardiovascular Disease

## 2015-12-18 VITALS — BP 124/68 | HR 56 | Ht 67.0 in | Wt 188.1 lb

## 2015-12-18 DIAGNOSIS — I2510 Atherosclerotic heart disease of native coronary artery without angina pectoris: Secondary | ICD-10-CM

## 2015-12-18 DIAGNOSIS — E785 Hyperlipidemia, unspecified: Secondary | ICD-10-CM

## 2015-12-18 LAB — LIPID PANEL
CHOLESTEROL: 124 mg/dL — AB (ref 125–200)
HDL: 45 mg/dL (ref >=40)
LDL CALC: 65 mg/dL (ref <130)
TRIGLYCERIDES: 71 mg/dL (ref <150)
Total CHOL/HDL Ratio: 2.8 Ratio (ref <=5.0)
VLDL: 14 mg/dL (ref <30)

## 2015-12-18 NOTE — Patient Instructions (Signed)
Medication Instructions:  Your physician recommends that you continue on your current medications as directed. Please refer to the Current Medication list given to you today.  Labwork: Your physician recommends that you have lab work today: LIPID  Testing/Procedures: No new orders.   Follow-Up: Your physician wants you to follow-up in: 1 YEAR with Dr Burt Knack.  You will receive a reminder letter in the mail two months in advance. If you don't receive a letter, please call our office to schedule the follow-up appointment.   Any Other Special Instructions Will Be Listed Below (If Applicable).     If you need a refill on your cardiac medications before your next appointment, please call your pharmacy.

## 2015-12-18 NOTE — Progress Notes (Signed)
Cardiology Office Note Date:  12/18/2015   ID:  [name], DOB [birth year], MRN [id number]  PCP:  [name] Koyanagi, MD  Cardiologist:  [name] Mocha, MD    Chief Complaint  Patient presents with  . CAD, native vessel    c/o sob with exertion,lee. denies cp,claudication     History of Present Illness: [name] is a 75 y.o. male who presents for follow-up of CAD. The patient initially presented in 2008 with an anterior wall MI complicated by out of hospital ventricular fibrillation arrest. He underwent primary PCI with stenting of the LAD (bare-metal). His left ventricular function had normalized on followup. He had a repeat heart catheterization in January 2014 after presenting with CCS class II angina and an abnormal exercise treadmill study. This demonstrated continued patency of his LAD stent with nonobstructive disease in the right coronary artery and left circumflex. There was moderate stenosis in the diagonal branch and this was thought to be responsible for his anginal symptoms and treadmill abnormality. Medical therapy was recommended. Other medical problems include hypertension, hyperlipidemia, and ITP.  The patient was hospitalized last month with an upper respiratory infection. He has subsequently started on CPAP because of diagnosis of obstructive sleep apnea. Reviewed records from his hospitalization and his BNP and troponin were within normal limits.cardiogram showed normal LV systolic function with grade 1 diastolic dysfunction and no valvular disease. The patient feels like he is recovering well. He denies chest pain or pressure, leg swelling, orthopnea, or heart palpitations. He does have dyspnea with physical exertion that occurs within 1 mile of walking.  Past Medical History  Diagnosis Date  . Coronary atherosclerosis of native coronary artery   . Hyperlipidemia     with low HDL  . Ankylosing spondylitis ([place])   . Hypertension   . Allergic rhinitis   .  Cough   . Abnormal chest x-ray   . Colonic polyp   . Hemorrhoids   . Rotator cuff tear   . Ganglion cyst of wrist     left  . Other disorder of muscle, ligament, and fascia   . Anxiety   . History of malaria   . Arthritis   . Kidney stone   . Heart attack ([place]) 2008  . Swelling of lower extremity 10/16/2015    Past Surgical History  Procedure Laterality Date  . Rotator cuff repair      bilat by Dr. French Ana  . Hemorrhoid surgery  10/10    in [place]  . Left heart catheterization with coronary angiogram N/A 09/03/2012    Procedure: LEFT HEART CATHETERIZATION WITH CORONARY ANGIOGRAM;  Surgeon: [name] Mocha, MD;  Location: [facility] CATH LAB;  Service: Cardiovascular;  Laterality: N/A;    Current Outpatient Prescriptions  Medication Sig Dispense Refill  . albuterol (PROVENTIL HFA;VENTOLIN HFA) 108 (90 Base) MCG/ACT inhaler Inhale 2 puffs into the lungs every 4 (four) hours as needed for wheezing or shortness of breath (cough, shortness of breath or wheezing.). 1 Inhaler 3  . amLODipine (NORVASC) 2.5 MG tablet Take 1 tablet (2.5 mg total) by mouth daily. 90 tablet 3  . aspirin 81 MG EC tablet Take 81 mg by mouth at bedtime.     . benzonatate (TESSALON) 200 MG capsule Take 1 capsule (200 mg total) by mouth 2 (two) times daily. 20 capsule 0  . omeprazole (PRILOSEC) 40 MG capsule Take 1 capsule (40 mg total) by mouth daily. 30 capsule 3  . simvastatin (ZOCOR) 20 MG tablet Take  20 mg by mouth daily.     No current facility-administered medications for this visit.    Allergies:   Review of patient's allergies indicates no known allergies.   Social History:  The patient  reports that he has never smoked. He has never used smokeless tobacco. He reports that he does not drink alcohol or use illicit drugs.   Family History:  The patient's family history includes Cancer in his mother; Diabetes in his mother; Emphysema in his mother; Rheum arthritis in his mother.    ROS:  Please see the history of  present illness.  Otherwise, review of systems is positive for leg swelling, cough, back pain, rash, fatigue, wheezing.  All other systems are reviewed and negative.   PHYSICAL EXAM: VS:  BP 124/68 mmHg  Pulse 56  Ht 5\' 7"  (1.702 m)  Wt 188 lb 1.9 oz (85.331 kg)  BMI 29.46 kg/m2 , BMI Body mass index is 29.46 kg/(m^2). GEN: Well nourished, well developed, in no acute distress HEENT: normal Neck: no JVD, no masses. No carotid bruits Cardiac: RRR without murmur or gallop                Respiratory:  clear to auscultation bilaterally, normal work of breathing GI: soft, nontender, nondistended, + BS MS: no deformity or atrophy Ext: no pretibial edema, pedal pulses 2+= bilaterally Skin: warm and dry, no rash Neuro:  Strength and sensation are intact Psych: euthymic mood, full affect  EKG:  EKG is not ordered today.  Recent Labs: 11/28/2015: ALT 25 11/30/2015: BUN 27*; Creatinine, Ser 1.10; Hemoglobin 14.3; Platelets 113*; Potassium 4.0; Sodium 139   Lipid Panel     Component Value Date/Time   CHOL 119 10/26/2013 1039   TRIG 56.0 10/26/2013 1039   HDL 39.00* 10/26/2013 1039   CHOLHDL 3 10/26/2013 1039   VLDL 11.2 10/26/2013 1039   LDLCALC 69 10/26/2013 1039      Wt Readings from Last 3 Encounters:  12/18/15 188 lb 1.9 oz (85.331 kg)  11/30/15 191 lb 12.8 oz (87 kg)  11/28/15 194 lb 3.2 oz (88.089 kg)    Cardiac Studies Reviewed: 2D Echo: Study Conclusions  - Left ventricle: The cavity size was normal. There was mild  concentric hypertrophy. Systolic function was vigorous. The  estimated ejection fraction was in the range of 65% to 70%. Wall  motion was normal; there were no regional wall motion  abnormalities. Doppler parameters are consistent with abnormal  left ventricular relaxation (grade 1 diastolic dysfunction).  Doppler parameters are consistent with elevated ventricular  end-diastolic filling pressure. - Aortic valve: Trileaflet; normal thickness  leaflets. There was no  regurgitation. - Aortic root: The aortic root was normal in size. - Mitral valve: Structurally normal valve. - Left atrium: The atrium was mildly dilated. - Right ventricle: The cavity size was normal. Wall thickness was  normal. Systolic function was normal. - Right atrium: The atrium was normal in size. - Tricuspid valve: There was no regurgitation. - Pulmonic valve: There was no regurgitation. - Pulmonary arteries: Systolic pressure was within the normal  range. - Inferior vena cava: The vessel was normal in size. - Pericardium, extracardiac: There was no pericardial effusion.  ASSESSMENT AND PLAN: 1.  CAD, native vessel: no angina. Continue same Rx. Recent hospital records reviewed.  2. HTN, essential: BP well-controlled on current Rx  3. Hyperlipidemia: treated with low dose simvastatin. Previous lipids very good and these are reviewed today. Will update his lipids since it is been  2 years. LFTs were recently checked and are normal.  Current medicines are reviewed with the patient today.  The patient does not have concerns regarding medicines.  Labs/ tests ordered today include:   Orders Placed This Encounter  Procedures  . Lipid panel    Disposition:   FU one year  Signed, [name] Mocha, MD  12/18/2015 1:10 PM    [facility] [address], [place], [place]  [address] Phone: [phone]; Fax: [phone]

## 2015-12-19 ENCOUNTER — Telehealth: Payer: Self-pay | Admitting: Cardiovascular Disease

## 2015-12-19 NOTE — Telephone Encounter (Signed)
I spoke with the pt and reviewed lipid panel results.

## 2015-12-19 NOTE — Telephone Encounter (Signed)
[name] is calling because he needing someone to explain the reults of his lipid test

## 2015-12-21 DIAGNOSIS — G4733 Obstructive sleep apnea (adult) (pediatric): Secondary | ICD-10-CM | POA: Diagnosis not present

## 2015-12-25 ENCOUNTER — Other Ambulatory Visit: Payer: Self-pay | Admitting: Cardiovascular Disease

## 2015-12-25 NOTE — Telephone Encounter (Signed)
REFILL 

## 2015-12-27 ENCOUNTER — Telehealth: Payer: Self-pay | Admitting: Pulmonary Disease

## 2015-12-27 DIAGNOSIS — G4733 Obstructive sleep apnea (adult) (pediatric): Secondary | ICD-10-CM

## 2015-12-27 NOTE — Telephone Encounter (Signed)
Called spoke with pt. He states he had a HST on 12/20/15 and is requesting is results. I explained to him that I would send a message to VS for his results and recs. He voiced understanding and had no further questions.   VS please advise

## 2015-12-28 NOTE — Telephone Encounter (Signed)
HST 12/21/15 >> AHI 44.5, SpO2 low 64%.  Please inform pt that sleep study showed severe obstructive sleep apnea.  If he has auto CPAP machine already, then he should continue using while asleep.  If he does not have CPAP machine, then please schedule in lab CPAP titration study.  He needs ROV in 3 months with me or nurse practitioner.

## 2015-12-29 ENCOUNTER — Telehealth: Payer: Self-pay | Admitting: Pulmonary Disease

## 2015-12-29 DIAGNOSIS — G4733 Obstructive sleep apnea (adult) (pediatric): Secondary | ICD-10-CM

## 2015-12-29 NOTE — Telephone Encounter (Signed)
HST faxed to [facility] with Corene Cornea- he states needing CPAP settings  Please advise thanks!

## 2015-12-29 NOTE — Telephone Encounter (Signed)
I can not locate the HST- checked with Dawn and Duanne Moron states will see if she can print it again  Will hold until I here back from her

## 2015-12-29 NOTE — Telephone Encounter (Signed)
Spoke with pt. He is aware of results. He is currently using CPAP, he will continue this. Pt would like an order sent to [facility] to update his CPAP machine. Order will be placed. ROV has been scheduled for 04/01/16 at 9:45am. Nothing further was needed.

## 2015-12-29 NOTE — Telephone Encounter (Signed)
Spoke with [employer]. Advised him of CPAP settings. Order will be placed. Nothing further was needed.

## 2015-12-29 NOTE — Telephone Encounter (Signed)
Auto CPAP with pressure range 5 to 20 cm H2O and heated humidity.  Please have download sent.

## 2015-12-29 NOTE — Telephone Encounter (Signed)
lmtcb x1 for pt. 

## 2016-01-02 DIAGNOSIS — G4733 Obstructive sleep apnea (adult) (pediatric): Secondary | ICD-10-CM | POA: Diagnosis not present

## 2016-01-03 ENCOUNTER — Other Ambulatory Visit: Payer: Self-pay | Admitting: *Deleted

## 2016-01-03 DIAGNOSIS — G4733 Obstructive sleep apnea (adult) (pediatric): Secondary | ICD-10-CM

## 2016-01-05 DIAGNOSIS — D225 Melanocytic nevi of trunk: Secondary | ICD-10-CM | POA: Diagnosis not present

## 2016-01-05 DIAGNOSIS — L814 Other melanin hyperpigmentation: Secondary | ICD-10-CM | POA: Diagnosis not present

## 2016-01-05 DIAGNOSIS — L821 Other seborrheic keratosis: Secondary | ICD-10-CM | POA: Diagnosis not present

## 2016-01-05 DIAGNOSIS — L570 Actinic keratosis: Secondary | ICD-10-CM | POA: Diagnosis not present

## 2016-01-05 DIAGNOSIS — L820 Inflamed seborrheic keratosis: Secondary | ICD-10-CM | POA: Diagnosis not present

## 2016-01-05 DIAGNOSIS — D1801 Hemangioma of skin and subcutaneous tissue: Secondary | ICD-10-CM | POA: Diagnosis not present

## 2016-02-15 ENCOUNTER — Telehealth: Payer: Self-pay | Admitting: Pulmonary Disease

## 2016-02-15 NOTE — Telephone Encounter (Signed)
Spoke with the pt  He states [facility] had advised him that they are needing something from our office in order for ins to continue to pay for CPAP  I called [facility] and spoke with Ailene Ravel  She states that the pt is needing face to face visit with VS  He is already scheduled for 04/01/16  She states this is fine and nothing further needed  I spoke with the pt and notified he needs to keep this appt  He verbalized understanding

## 2016-02-26 ENCOUNTER — Other Ambulatory Visit: Payer: Self-pay | Admitting: Cardiovascular Disease

## 2016-04-01 ENCOUNTER — Ambulatory Visit (INDEPENDENT_AMBULATORY_CARE_PROVIDER_SITE_OTHER): Payer: Medicare Other | Admitting: Pulmonary Disease

## 2016-04-01 ENCOUNTER — Encounter: Payer: Self-pay | Admitting: Pulmonary Disease

## 2016-04-01 VITALS — BP 118/70 | HR 53 | Ht 67.0 in | Wt 193.4 lb

## 2016-04-01 DIAGNOSIS — I2510 Atherosclerotic heart disease of native coronary artery without angina pectoris: Secondary | ICD-10-CM | POA: Diagnosis not present

## 2016-04-01 DIAGNOSIS — J986 Disorders of diaphragm: Secondary | ICD-10-CM

## 2016-04-01 DIAGNOSIS — G4733 Obstructive sleep apnea (adult) (pediatric): Secondary | ICD-10-CM | POA: Diagnosis not present

## 2016-04-01 DIAGNOSIS — R059 Cough, unspecified: Secondary | ICD-10-CM

## 2016-04-01 DIAGNOSIS — R05 Cough: Secondary | ICD-10-CM | POA: Diagnosis not present

## 2016-04-01 DIAGNOSIS — Z9989 Dependence on other enabling machines and devices: Principal | ICD-10-CM

## 2016-04-01 NOTE — Progress Notes (Signed)
Current Outpatient Prescriptions on File Prior to Visit  Medication Sig  . amLODipine (NORVASC) 2.5 MG tablet TAKE 1 TABLET EVERY DAY  . aspirin 81 MG EC tablet Take 81 mg by mouth at bedtime.   . simvastatin (ZOCOR) 20 MG tablet TAKE 1 TABLET EVERY DAY WITH BREAKFAST  . albuterol (PROVENTIL HFA;VENTOLIN HFA) 108 (90 Base) MCG/ACT inhaler Inhale 2 puffs into the lungs every 4 (four) hours as needed for wheezing or shortness of breath (cough, shortness of breath or wheezing.). (Patient not taking: Reported on 04/01/2016)   No current facility-administered medications on file prior to visit.      Chief Complaint  Patient presents with  . Hospitalization Follow-up    Pt reports that cough is almost completely resolved. Denies any increased breathing issues. Pt wears CPAP nightly.  Having issues with mask, nasal pillows seem to shift while sleeping and it leaks and the pressure with the nose mask seems to not be strong enough. Accumulates moisture at night. DME: [facility]     Pulmonary tests CT angio chest 11/30/15 >> motion artifact, mosaic pattern, calcified granuloma SNIFF test 11/30/15 >> elevated Rt diaphragm, but has b/l diaphragm movement  Cardiac tests 10/16/15 Doppler legs b/l >> no DVT 11/30/15 Echo >> EF 65 to [id number], grade 1 diastolic CHF  Sleep tests HST 12/21/15 >> AHI 44.5, SpO2 low 64% Auto CPAP 03/02/16 to 03/31/16 >> used on 26 of 30 nights with average 6 hrs 32 min.  Average AHI 5.5 with median CPAP 8 and 95 th percentile CPAP 11 cm H2O  Past medical hx Ankylosing spondylitis, Allergies, CAD, HLD, HTN, Nephrolithiasis, Malaria  Past surgical hx, Allergies, Family hx, Social hx all reviewed.  Vital Signs BP 118/70 (BP Location: Left Arm, Cuff Size: Normal)   Pulse (!) 53   Ht 5\' 7"  (1.702 m)   Wt 193 lb 6.4 oz (87.7 kg)   SpO2 95%   BMI 30.29 kg/m   History of Present Illness [name] is a 75 y.o. male with OSA, Rt diaphragm elevation, and cough.  Since I saw him in  hospital he had home sleep study >> severe OSA.  He has been using CPAP with nasal pillows.  Some nasal dryness.  Sleeping better at night, but still tired.  His wife watches TV in bed >> makes in difficult for him to fall asleep.  He is only getting about 6 hours sleep per night.  He will then fall asleep for about an hour during the day >> not using CPAP during the daytime naps.  Since being in the hospital his cough has essential resolved.  He is not needing to use albuterol.  He denies wheezing, or chest congestion.  Physical Exam  General - No distress ENT - No sinus tenderness, no oral exudate, no LAN Cardiac - s1s2 regular, no murmur Chest - No wheeze/rales/dullness Back - No focal tenderness Abd - Soft, non-tender Ext - No edema Neuro - Normal strength Skin - No rashes Psych - normal mood, and behavior   Assessment/Plan  Obstructive sleep apnea. - he is compliant with therapy and reports benefit - discussed importance of maintaining proper sleep environment  Chronic Rt hemidiaphragm elevation. - will arrange for ONO with CPAP to assess whether he could also be having some degree of nocturnal hypoxia/hypoventilation in addition to sleep apnea  Cough. - much improved >> most of his nocturnal "cough: was likely related to untreated sleep apnea - defer further pulmonary function testing for now  Patient Instructions  Will arrange for overnight oxygen test with you using your CPAP  Follow up in 6 months   Chesley Mires, MD [facility] Pager:  [phone] 04/01/2016, 10:08 AM

## 2016-04-01 NOTE — Patient Instructions (Signed)
Will arrange for overnight oxygen test with you using your CPAP  Follow up in 6 months

## 2016-04-10 DIAGNOSIS — R0902 Hypoxemia: Secondary | ICD-10-CM | POA: Diagnosis not present

## 2016-04-10 DIAGNOSIS — G4733 Obstructive sleep apnea (adult) (pediatric): Secondary | ICD-10-CM | POA: Diagnosis not present

## 2016-04-18 ENCOUNTER — Telehealth: Payer: Self-pay | Admitting: Pulmonary Disease

## 2016-04-18 NOTE — Telephone Encounter (Addendum)
ONO with CPAP 04/10/16 >> test time 6 hrs 30 min.  Mean SpO2 93.51%, low SpO2 77%.  Spent 7 min with SpO2 < 88%.   Will have my nurse inform pt that ONO looked okay.  He should continue using CPAP at night.

## 2016-04-19 NOTE — Telephone Encounter (Signed)
LM x 1 

## 2016-04-22 ENCOUNTER — Telehealth: Payer: Self-pay | Admitting: Internal Medicine

## 2016-04-22 ENCOUNTER — Ambulatory Visit (HOSPITAL_BASED_OUTPATIENT_CLINIC_OR_DEPARTMENT_OTHER): Payer: Medicare Other | Admitting: Internal Medicine

## 2016-04-22 ENCOUNTER — Other Ambulatory Visit: Payer: Medicare Other

## 2016-04-22 ENCOUNTER — Encounter: Payer: Self-pay | Admitting: Internal Medicine

## 2016-04-22 VITALS — BP 128/62 | HR 56 | Temp 98.9°F | Resp 18 | Ht 67.0 in | Wt 193.3 lb

## 2016-04-22 DIAGNOSIS — D696 Thrombocytopenia, unspecified: Secondary | ICD-10-CM

## 2016-04-22 LAB — CBC WITH DIFFERENTIAL/PLATELET
BASO%: 3.3 % — ABNORMAL HIGH (ref 0.0–2.0)
Basophils Absolute: 0.1 10*3/uL (ref 0.0–0.1)
EOS ABS: 0 10*3/uL (ref 0.0–0.5)
EOS%: 0.7 % (ref 0.0–7.0)
HCT: 42.9 % (ref 38.4–49.9)
HGB: 14.3 g/dL (ref 13.0–17.1)
LYMPH%: 26.1 % (ref 14.0–49.0)
MCH: 29.7 pg (ref 27.2–33.4)
MCHC: 33.4 g/dL (ref 32.0–36.0)
MCV: 89.2 fL (ref 79.3–98.0)
MONO#: 0.5 10*3/uL (ref 0.1–0.9)
MONO%: 15.1 % — ABNORMAL HIGH (ref 0.0–14.0)
NEUT%: 54.8 % (ref 39.0–75.0)
NEUTROS ABS: 1.8 10*3/uL (ref 1.5–6.5)
PLATELETS: 68 10*3/uL — AB (ref 140–400)
RBC: 4.81 10*6/uL (ref 4.20–5.82)
RDW: 15.5 % — ABNORMAL HIGH (ref 11.0–14.6)
WBC: 3.3 10*3/uL — ABNORMAL LOW (ref 4.0–10.3)
lymph#: 0.9 10*3/uL (ref 0.9–3.3)

## 2016-04-22 LAB — COMPREHENSIVE METABOLIC PANEL
ALT: 28 U/L (ref 0–55)
ANION GAP: 9 meq/L (ref 3–11)
AST: 19 U/L (ref 5–34)
Albumin: 4 g/dL (ref 3.5–5.0)
Alkaline Phosphatase: 57 U/L (ref 40–150)
BUN: 14.9 mg/dL (ref 7.0–26.0)
CALCIUM: 9.1 mg/dL (ref 8.4–10.4)
CHLORIDE: 108 meq/L (ref 98–109)
CO2: 25 mEq/L (ref 22–29)
Creatinine: 0.9 mg/dL (ref 0.7–1.3)
EGFR: 85 mL/min/{1.73_m2} — AB (ref >90)
Glucose: 121 mg/dl (ref 70–140)
POTASSIUM: 3.9 meq/L (ref 3.5–5.1)
Sodium: 142 mEq/L (ref 136–145)
Total Bilirubin: 0.78 mg/dL (ref 0.20–1.20)
Total Protein: 6.8 g/dL (ref 6.4–8.3)

## 2016-04-22 NOTE — Progress Notes (Signed)
[place] Telephone:([phone]   [phone]  OFFICE PROGRESS NOTE  DOOLITTLE, Linton Ham, MD No address on file  DIAGNOSIS: Thrombocytopenia, most likely idiopathic thrombocytopenic purpura.  PRIOR THERAPY:None.  CURRENT THERAPY: observation.  INTERVAL HISTORY: [name] 75 y.o. male returns to the clinic today for routine six-month followup visit. The patient is feeling fine today with no specific complaints. He denied having any significant bleeding issues, bruises or ecchymosis. The patient has no chest pain, shortness of breath, cough or hemoptysis. He denied having any significant weight loss or night sweats. He has repeat CBC performed earlier today and is here for evaluation and discussion of his lab results.  MEDICAL HISTORY: Past Medical History:  Diagnosis Date  . Abnormal chest x-ray   . Allergic rhinitis   . Ankylosing spondylitis ([place])   . Anxiety   . Arthritis   . Colonic polyp   . Coronary atherosclerosis of native coronary artery   . Cough   . Ganglion cyst of wrist    left  . Heart attack ([place]) 2008  . Hemorrhoids   . History of malaria   . Hyperlipidemia    with low HDL  . Hypertension   . Kidney stone   . Other disorder of muscle, ligament, and fascia   . Rotator cuff tear   . Swelling of lower extremity 10/16/2015    ALLERGIES:  has No Known Allergies.  MEDICATIONS:  Current Outpatient Prescriptions  Medication Sig Dispense Refill  . albuterol (PROVENTIL HFA;VENTOLIN HFA) 108 (90 Base) MCG/ACT inhaler Inhale 2 puffs into the lungs every 4 (four) hours as needed for wheezing or shortness of breath (cough, shortness of breath or wheezing.). 1 Inhaler 3  . amLODipine (NORVASC) 2.5 MG tablet TAKE 1 TABLET EVERY DAY 90 tablet 3  . aspirin 81 MG EC tablet Take 81 mg by mouth at bedtime.     . simvastatin (ZOCOR) 20 MG tablet TAKE 1 TABLET EVERY DAY WITH BREAKFAST 90 tablet 2   No current facility-administered  medications for this visit.     SURGICAL HISTORY:  Past Surgical History:  Procedure Laterality Date  . HEMORRHOID SURGERY  10/10   in [place]  . LEFT HEART CATHETERIZATION WITH CORONARY ANGIOGRAM N/A 09/03/2012   Procedure: LEFT HEART CATHETERIZATION WITH CORONARY ANGIOGRAM;  Surgeon: [name], MD;  Location: [facility] CATH LAB;  Service: Cardiovascular;  Laterality: N/A;  . ROTATOR CUFF REPAIR     bilat by Dr. French Ana    REVIEW OF SYSTEMS:  A comprehensive review of systems was negative.   PHYSICAL EXAMINATION: General appearance: alert, cooperative and no distress Head: Normocephalic, without obvious abnormality, atraumatic Neck: no adenopathy Lymph nodes: Cervical, supraclavicular, and axillary nodes normal. Resp: clear to auscultation bilaterally Back: symmetric, no curvature. ROM normal. No CVA tenderness. Cardio: regular rate and rhythm, S1, S2 normal, no murmur, click, rub or gallop GI: soft, non-tender; bowel sounds normal; no masses,  no organomegaly Extremities: edema And tenderness of the right calf Neurologic: Alert and oriented X 3, normal strength and tone. Normal symmetric reflexes. Normal coordination and gait  ECOG PERFORMANCE STATUS: 0 - Asymptomatic  There were no vitals taken for this visit.  LABORATORY DATA: Lab Results  Component Value Date   WBC 3.3 (L) 04/22/2016   HGB 14.3 04/22/2016   HCT 42.9 04/22/2016   MCV 89.2 04/22/2016   PLT 68 (L) 04/22/2016      Chemistry      Component Value  Date/Time   NA 139 11/30/2015 0035   NA 142 04/18/2015 0816   K 4.0 11/30/2015 0035   K 4.2 04/18/2015 0816   CL 105 11/30/2015 0035   CL 106 09/30/2012 0949   CO2 26 11/28/2015 1008   CO2 27 04/18/2015 0816   BUN 27 (H) 11/30/2015 0035   BUN 19.1 04/18/2015 0816   CREATININE 1.10 11/30/2015 0035   CREATININE 0.91 11/28/2015 1008   CREATININE 0.9 04/18/2015 0816      Component Value Date/Time   CALCIUM 9.2 11/28/2015 1008   CALCIUM 9.1 04/18/2015 0816    ALKPHOS 58 11/28/2015 1008   ALKPHOS 52 04/18/2015 0816   AST 21 11/28/2015 1008   AST 21 04/18/2015 0816   ALT 25 11/28/2015 1008   ALT 29 04/18/2015 0816   BILITOT 0.6 11/28/2015 1008   BILITOT 0.76 04/18/2015 0816     BONE MARROW REPORT FINAL DIAGNOSIS RADIOGRAPHIC STUDIES: No results found.  ASSESSMENT AND PLAN: This is a very pleasant 75 years old white male with thrombocytopenia most likely idiopathic thrombocytopenic purpura.  His platelets count are low at 68,000 but the patient continues to be asymptomatic.   I recommended for him to continue on observation for now. I would see him back for followup visit in 6 months with repeat CBC and LDH.  I advised the patient to call me immediately if he has any significant bleeding issues, bruises or ecchymosis or if his platelets count to be less than 50,000.  All questions were answered. The patient knows to call the clinic with any problems, questions or concerns. We can certainly see the patient much sooner if necessary.  Disclaimer: This note was dictated with voice recognition software. Similar sounding words can inadvertently be transcribed and may not be corrected upon review.

## 2016-04-22 NOTE — Telephone Encounter (Signed)
lmtcb x2 

## 2016-04-22 NOTE — Telephone Encounter (Signed)
Gave patient avs report and appointments for march 2018

## 2016-04-29 NOTE — Telephone Encounter (Signed)
Results have been explained to patient, pt expressed understanding. Nothing further needed.  

## 2016-05-10 ENCOUNTER — Encounter: Payer: Self-pay | Admitting: Pulmonary Disease

## 2016-05-10 ENCOUNTER — Other Ambulatory Visit: Payer: Self-pay | Admitting: Pulmonary Disease

## 2016-05-18 DIAGNOSIS — Z23 Encounter for immunization: Secondary | ICD-10-CM | POA: Diagnosis not present

## 2016-06-24 ENCOUNTER — Telehealth: Payer: Self-pay | Admitting: *Deleted

## 2016-06-24 ENCOUNTER — Telehealth: Payer: Self-pay | Admitting: Family Medicine

## 2016-06-24 ENCOUNTER — Ambulatory Visit (INDEPENDENT_AMBULATORY_CARE_PROVIDER_SITE_OTHER): Payer: Medicare Other | Admitting: Family Medicine

## 2016-06-24 VITALS — BP 126/70 | HR 56 | Temp 98.5°F | Resp 16 | Ht 67.0 in | Wt 192.0 lb

## 2016-06-24 DIAGNOSIS — M79651 Pain in right thigh: Secondary | ICD-10-CM

## 2016-06-24 DIAGNOSIS — I2510 Atherosclerotic heart disease of native coronary artery without angina pectoris: Secondary | ICD-10-CM

## 2016-06-24 LAB — CBC WITH DIFFERENTIAL/PLATELET
BASOS PCT: 0 %
Basophils Absolute: 0 cells/uL (ref 0–200)
EOS PCT: 0 %
Eosinophils Absolute: 0 cells/uL — ABNORMAL LOW (ref 15–500)
HEMATOCRIT: 44.2 % (ref 38.5–50.0)
HEMOGLOBIN: 14.8 g/dL (ref 13.2–17.1)
LYMPHS ABS: 1058 {cells}/uL (ref 850–3900)
Lymphocytes Relative: 23 %
MCH: 30 pg (ref 27.0–33.0)
MCHC: 33.5 g/dL (ref 32.0–36.0)
MCV: 89.7 fL (ref 80.0–100.0)
MONO ABS: 736 {cells}/uL (ref 200–950)
Monocytes Relative: 16 %
NEUTROS ABS: 2806 {cells}/uL (ref 1500–7800)
NEUTROS PCT: 61 %
Platelets: 92 10*3/uL — ABNORMAL LOW (ref 140–400)
RBC: 4.93 MIL/uL (ref 4.20–5.80)
RDW: 15.7 % — ABNORMAL HIGH (ref 11.0–15.0)
WBC: 4.6 10*3/uL (ref 3.8–10.8)

## 2016-06-24 LAB — D-DIMER, QUANTITATIVE (NOT AT ARMC): D DIMER QUANT: 0.32 ug{FEU}/mL (ref <0.50)

## 2016-06-24 NOTE — Telephone Encounter (Signed)
Message received from patient stating that he has noted a "knot under skin with bruise" and would like to know if Dr. [name] can see him today.  Call placed back to patient at 1215 and patient states that he has already been to see a NP at Urgent Care this AM.  He states that the knot is on his left upper thigh and is tender to touch and that the urgent care drew lab work on him this AM and will be calling him back for possible [place] of his leg to r/o clot.  Instructed patient to please keep [place] informed at [facility].  Patient appreciative of call back.  Will forward message to Dr. Worthy Flank desk nurse.

## 2016-06-24 NOTE — Telephone Encounter (Signed)
Called patient to advise D-dimer was completely normal which significantly minimizes the likelihood of a DVT in his upper right thigh. Advised to continue to apply ice to contusion and to allow 7-14 days for ecchymotic skin to return to normal color.  Carroll Sage. Kenton Kingfisher, MSN, FNP-C [facility]

## 2016-06-24 NOTE — Progress Notes (Signed)
Patient ID: [name], male    DOB: 1941-01-08, 74 y.o.   MRN: [id number]  PCP: Leandrew Koyanagi, MD (Inactive)  Chief Complaint  Patient presents with  . Leg Pain    knot, tender to touch x 3days     Subjective:   HPI 75 year old presents for evaluation of discolored, tender lesion upper right thigh. Patient is established with [facility]. He recently returned from vacation and reports while on vacation in [place], he noticed 3-4 days ago noticing an area of tenderness upper right thigh. He had his wife visualize the area yesterday and she reported that his skin was discolored purplish and dark. Concerned of a possible blood clot. Denies any known impact injury or trauma. He has been riding several amusement park rides while on vacation but doesn't recall an injury. Patient has hx of CAD and takes daily bay aspirin and thrombocytopenia which he uncertain of his last platelet count.  Social History   Social History  . Marital status: Married    Spouse name: [name]  . Number of children: 2  . Years of education: N/A   Occupational History  . Not on file.   Social History Main Topics  . Smoking status: Never Smoker  . Smokeless tobacco: Never Used  . Alcohol use No     Comment: occ 1 beer, wine occ  . Drug use: No  . Sexual activity: Yes   Other Topics Concern  . Not on file   Social History Narrative  . No narrative on file    Family History  Problem Relation Age of Onset  . Cancer Mother     melanoma  . Emphysema Mother   . Rheum arthritis Mother   . Diabetes Mother       Review of Systems See HPI   Patient Active Problem List   Diagnosis Date Noted  . Obstructive sleep apnea 12/01/2015  . Acute bronchitis 11/30/2015  . Swelling of lower extremity 10/16/2015  . Benign mole 02/04/2014  . Impaired glucose tolerance 10/26/2013  . NAFLD (nonalcoholic fatty liver disease) 04/27/2013  . Thrombocytopenia ([place]) 10/21/2012  . Exertional angina (HCC)  09/03/2012  . MALARIA 12/05/2009  . COLONIC POLYPS 12/05/2009  . HYPERCHOLESTEROLEMIA 12/05/2009  . ANXIETY 12/05/2009  . HEMORRHOIDS 12/05/2009  . ALLERGIC RHINITIS 12/05/2009  . ANKYLOSING SPONDYLITIS 12/05/2009  . GANGLION CYST, WRIST, LEFT 12/05/2009  . ROTATOR CUFF TEAR 12/05/2009  . OTHER DISORDER OF MUSCLE LIGAMENT AND FASCIA 12/05/2009  . ABNORMAL CHEST XRAY 12/05/2009  . CAD, NATIVE VESSEL 04/05/2009     Prior to Admission medications   Medication Sig Start Date End Date Taking? Authorizing Provider  amLODipine (NORVASC) 2.5 MG tablet TAKE 1 TABLET EVERY DAY 12/25/15  Yes [name], MD  aspirin 81 MG EC tablet Take 81 mg by mouth at bedtime.    Yes Historical Provider, MD  simvastatin (ZOCOR) 20 MG tablet TAKE 1 TABLET EVERY DAY WITH BREAKFAST 02/27/16  Yes [name], MD  albuterol (PROVENTIL HFA;VENTOLIN HFA) 108 (90 Base) MCG/ACT inhaler Inhale 2 puffs into the lungs every 4 (four) hours as needed for wheezing or shortness of breath (cough, shortness of breath or wheezing.). Patient not taking: Reported on 06/24/2016 11/24/15   [name], MD     No Known Allergies     Objective:  Physical Exam  Constitutional: He is oriented to person, place, and time. He appears well-developed and well-nourished.  HENT:  Head: Normocephalic and atraumatic.  Right  Ear: External ear normal.  Left Ear: External ear normal.  Eyes: Conjunctivae are normal. Pupils are equal, round, and reactive to light.  Neck: Normal range of motion. Neck supple.  Cardiovascular: Normal rate, regular rhythm, normal heart sounds and intact distal pulses.   Pulmonary/Chest: Effort normal and breath sounds normal.  Musculoskeletal: Normal range of motion.  Neurological: He is alert and oriented to person, place, and time.  Skin: Skin is warm and dry. Ecchymosis noted.     Psychiatric: He has a normal mood and affect. His behavior is normal. Judgment and thought content normal.    Vitals:    06/24/16 1107  BP: 126/70  Pulse: (!) 56  Resp: 16  Temp: 98.5 F (36.9 C)   Assessment & Plan:  1. Pain of right thigh, circular nodular ecchymotic lesion localized upper right thigh of unknown origin. Mildly tender to palpation. Unlikely a blood clot, WELL score =0, however will obtain a D-dimer to further confirm the unlikelihood of a blood clot. Remainder of right leg negative for pain, tenderness, edema, or erythema.  Plan: - CBC with Differential/Platelet  - D-dimer, quantitative (not at [facility])  I suspect, your inner thigh nodule is likely related to bruising of an unknown cause.  These typically resolve with ice applications, time, and Tylenol for pain.   Carroll Sage. Kenton Kingfisher, MSN, FNP-C [facility]

## 2016-06-24 NOTE — Patient Instructions (Addendum)
I will contact you this evening with the results of your lab work.  I suspect, your inner thigh nodule is likely related to bruising of an unknown cause.  These typically resolve with ice applications, time, and Tylenol for pain.   IF you received an x-ray today, you will receive an invoice from [facility]. Please contact [facility] at [phone] with questions or concerns regarding your invoice.   IF you received labwork today, you will receive an invoice from [employer]. Please contact [facility] at [phone] with questions or concerns regarding your invoice.   Our billing staff will not be able to assist you with questions regarding bills from these companies.  You will be contacted with the lab results as soon as they are available. The fastest way to get your results is to activate your My Chart account. Instructions are located on the last page of this paperwork. If you have not heard from [place] regarding the results in 2 weeks, please contact this office.     Contusion A contusion is a deep bruise. Contusions are the result of a blunt injury to tissues and muscle fibers under the skin. The injury causes bleeding under the skin. The skin overlying the contusion may turn blue, purple, or yellow. Minor injuries will give you a painless contusion, but more severe contusions may stay painful and swollen for a few weeks.  CAUSES  This condition is usually caused by a blow, trauma, or direct force to an area of the body. SYMPTOMS  Symptoms of this condition include:  Swelling of the injured area.  Pain and tenderness in the injured area.  Discoloration. The area may have redness and then turn blue, purple, or yellow. DIAGNOSIS  This condition is diagnosed based on a physical exam and medical history. An X-ray, CT scan, or MRI may be needed to determine if there are any associated injuries, such as broken bones (fractures). TREATMENT   Specific treatment for this condition depends on what area of the body was injured. In general, the best treatment for a contusion is resting, icing, applying pressure to (compression), and elevating the injured area. This is often called the RICE strategy. Over-the-counter anti-inflammatory medicines may also be recommended for pain control.  HOME CARE INSTRUCTIONS   Rest the injured area.  If directed, apply ice to the injured area:  Put ice in a plastic bag.  Place a towel between your skin and the bag.  Leave the ice on for 20 minutes, 2-3 times per day.  If directed, apply light compression to the injured area using an elastic bandage. Make sure the bandage is not wrapped too tightly. Remove and reapply the bandage as directed by your health care provider.  If possible, raise (elevate) the injured area above the level of your heart while you are sitting or lying down.  Take over-the-counter and prescription medicines only as told by your health care provider. SEEK MEDICAL CARE IF:  Your symptoms do not improve after several days of treatment.  Your symptoms get worse.  You have difficulty moving the injured area. SEEK IMMEDIATE MEDICAL CARE IF:   You have severe pain.  You have numbness in a hand or foot.  Your hand or foot turns pale or cold.   This information is not intended to replace advice given to you by your health care provider. Make sure you discuss any questions you have with your health care provider.   Document Released: 05/08/2005 Document Revised: 04/19/2015  Document Reviewed: 12/14/2014 Elsevier Interactive Patient Education [employer].

## 2016-07-12 ENCOUNTER — Ambulatory Visit (INDEPENDENT_AMBULATORY_CARE_PROVIDER_SITE_OTHER): Payer: Medicare Other | Admitting: Emergency Medicine

## 2016-07-12 ENCOUNTER — Ambulatory Visit (INDEPENDENT_AMBULATORY_CARE_PROVIDER_SITE_OTHER): Payer: Medicare Other

## 2016-07-12 VITALS — BP 128/60 | HR 68 | Temp 97.4°F | Ht 67.0 in | Wt 192.6 lb

## 2016-07-12 DIAGNOSIS — L570 Actinic keratosis: Secondary | ICD-10-CM | POA: Diagnosis not present

## 2016-07-12 DIAGNOSIS — I2510 Atherosclerotic heart disease of native coronary artery without angina pectoris: Secondary | ICD-10-CM

## 2016-07-12 DIAGNOSIS — Z9989 Dependence on other enabling machines and devices: Secondary | ICD-10-CM

## 2016-07-12 DIAGNOSIS — R739 Hyperglycemia, unspecified: Secondary | ICD-10-CM | POA: Diagnosis not present

## 2016-07-12 DIAGNOSIS — R059 Cough, unspecified: Secondary | ICD-10-CM

## 2016-07-12 DIAGNOSIS — R05 Cough: Secondary | ICD-10-CM

## 2016-07-12 DIAGNOSIS — G4733 Obstructive sleep apnea (adult) (pediatric): Secondary | ICD-10-CM

## 2016-07-12 DIAGNOSIS — L820 Inflamed seborrheic keratosis: Secondary | ICD-10-CM | POA: Diagnosis not present

## 2016-07-12 DIAGNOSIS — R2242 Localized swelling, mass and lump, left lower limb: Secondary | ICD-10-CM

## 2016-07-12 DIAGNOSIS — R0602 Shortness of breath: Secondary | ICD-10-CM | POA: Diagnosis not present

## 2016-07-12 DIAGNOSIS — D485 Neoplasm of uncertain behavior of skin: Secondary | ICD-10-CM | POA: Diagnosis not present

## 2016-07-12 DIAGNOSIS — Z131 Encounter for screening for diabetes mellitus: Secondary | ICD-10-CM | POA: Diagnosis not present

## 2016-07-12 DIAGNOSIS — S7002XA Contusion of left hip, initial encounter: Secondary | ICD-10-CM | POA: Diagnosis not present

## 2016-07-12 DIAGNOSIS — R202 Paresthesia of skin: Secondary | ICD-10-CM | POA: Diagnosis not present

## 2016-07-12 DIAGNOSIS — L578 Other skin changes due to chronic exposure to nonionizing radiation: Secondary | ICD-10-CM | POA: Diagnosis not present

## 2016-07-12 LAB — POCT CBC
GRANULOCYTE PERCENT: 70.5 % (ref 37–80)
HEMATOCRIT: 40.5 % — AB (ref 43.5–53.7)
HEMOGLOBIN: 14.9 g/dL (ref 14.1–18.1)
LYMPH, POC: 1.4 (ref 0.6–3.4)
MCH, POC: 31.9 pg — AB (ref 27–31.2)
MCHC: 36.7 g/dL — AB (ref 31.8–35.4)
MCV: 86.8 fL (ref 80–97)
MID (cbc): 0.5 (ref 0–0.9)
MPV: 7.7 fL (ref 0–99.8)
PLATELET COUNT, POC: 68 10*3/uL — AB (ref 142–424)
POC GRANULOCYTE: 4.4 (ref 2–6.9)
POC LYMPH %: 21.7 % (ref 10–50)
POC MID %: 7.8 %M (ref 0–12)
RBC: 4.67 M/uL — AB (ref 4.69–6.13)
RDW, POC: 15.9 %
WBC: 6.3 10*3/uL (ref 4.6–10.2)

## 2016-07-12 LAB — POCT GLYCOSYLATED HEMOGLOBIN (HGB A1C): Hemoglobin A1C: 5.5

## 2016-07-12 LAB — GLUCOSE, POCT (MANUAL RESULT ENTRY): POC GLUCOSE: 99 mg/dL (ref 70–99)

## 2016-07-12 MED ORDER — ALBUTEROL SULFATE HFA 108 (90 BASE) MCG/ACT IN AERS: 2.0000 | INHALATION_SPRAY | RESPIRATORY_TRACT | 3 refills | Status: DC | PRN

## 2016-07-12 MED ORDER — BENZONATATE 100 MG PO CAPS: 100.0000 mg | ORAL_CAPSULE | Freq: Three times a day (TID) | ORAL | 0 refills | Status: DC | PRN

## 2016-07-12 MED ORDER — PREDNISONE 10 MG PO TABS: ORAL_TABLET | ORAL | 0 refills | Status: DC

## 2016-07-12 NOTE — Progress Notes (Unsigned)
Pt needed rx to [facility]. I resent them

## 2016-07-12 NOTE — Progress Notes (Addendum)
Patient ID: [name], male   DOB: 07/28/1941, 75 y.o.   MRN: [id number]    By signing my name below, I, Essence Howell, attest that this documentation has been prepared under the direction and in the presence of [name], MD Electronically Signed: Ladene Artist, ED Scribe 07/12/2016 at 10:07 AM.  Chief Complaint:  Chief Complaint  Patient presents with  . Cough    X5 days    HPI: [name] is a 75 y.o. male who reports to [facility] today complaining of persistent cough for the past 5 days. Pt was seen in the office 8 months ago for ongoing cough and dyspnea for over a week and was diagnosed with CAP. Today, pt reports a persistent cough with clear phlegm at night and during the day. He has also noticed some mild wheezing and raspiness to his voice. Pt states that symptoms are similar to episode 8 months ago. He denies sob, fever, chills. Pt states that he has not been able to use his cpap over the past few days due to mild sore throat and congestion. He is still seeing Dr. Halford Chessman but is no longer seeing Dr. Lenna Gilford.   Pt also presents with a nodule to the left thigh that he would like to have re-evaluated at this visit. He was seen in the office on 06/24/16 by Molli Barrows, NP for left thigh pain first noticed 3-4 days prior, after returning from vacation in [place] where he had been riding several amusement park rides, but did not recall impact trauma or injury to the area. Pt had a negative D-dimer and no venous doppler was obtained. He states that the bruising is still present and he has noticed a firm "knot" to his left thigh since the visit 2 weeks ago and is concerned of a possible blood clot.   Past Medical History:  Diagnosis Date  . Abnormal chest x-ray   . Allergic rhinitis   . Ankylosing spondylitis ([place])   . Anxiety   . Arthritis   . Colonic polyp   . Coronary atherosclerosis of native coronary artery   . Cough   . Ganglion cyst of wrist    left  . Heart attack  2008  . Hemorrhoids   . History of malaria   . Hyperlipidemia    with low HDL  . Hypertension   . Kidney stone   . Other disorder of muscle, ligament, and fascia   . Rotator cuff tear   . Swelling of lower extremity 10/16/2015   Past Surgical History:  Procedure Laterality Date  . HEMORRHOID SURGERY  10/10   in [place]  . LEFT HEART CATHETERIZATION WITH CORONARY ANGIOGRAM N/A 09/03/2012   Procedure: LEFT HEART CATHETERIZATION WITH CORONARY ANGIOGRAM;  Surgeon: [name], MD;  Location: [facility] CATH LAB;  Service: Cardiovascular;  Laterality: N/A;  . ROTATOR CUFF REPAIR     bilat by Dr. French Ana   Social History   Social History  . Marital status: Married    Spouse name: [name]  . Number of children: 2  . Years of education: N/A   Social History Main Topics  . Smoking status: Never Smoker  . Smokeless tobacco: Never Used  . Alcohol use No     Comment: occ 1 beer, wine occ  . Drug use: No  . Sexual activity: Yes   Other Topics Concern  . None   Social History Narrative  . None   Family History  Problem Relation Age of  Onset  . Cancer Mother     melanoma  . Emphysema Mother   . Rheum arthritis Mother   . Diabetes Mother    No Known Allergies Prior to Admission medications   Medication Sig Start Date End Date Taking? Authorizing Provider  amLODipine (NORVASC) 2.5 MG tablet TAKE 1 TABLET EVERY DAY 12/25/15  Yes [name], MD  aspirin 81 MG EC tablet Take 81 mg by mouth at bedtime.    Yes Historical Provider, MD  simvastatin (ZOCOR) 20 MG tablet TAKE 1 TABLET EVERY DAY WITH BREAKFAST 02/27/16  Yes [name], MD  albuterol (PROVENTIL HFA;VENTOLIN HFA) 108 (90 Base) MCG/ACT inhaler Inhale 2 puffs into the lungs every 4 (four) hours as needed for wheezing or shortness of breath (cough, shortness of breath or wheezing.). Patient not taking: Reported on 07/12/2016 11/24/15   [name], MD   ROS: The patient denies fevers, chills, night sweats, unintentional weight  loss, chest pain, palpitations, dyspnea on exertion, nausea, vomiting, abdominal pain, dysuria, hematuria, melena, numbness, weakness, or tingling. +cough, +sore throat, +congestion, +voice change, +wheezing (mild), +color change (L thigh)  All other systems have been reviewed and were otherwise negative with the exception of those mentioned in the HPI and as above.    PHYSICAL EXAM: Vitals:   07/12/16 0952  BP: 128/60  Pulse: 68  Temp: 97.4 F (36.3 C)   Body mass index is 30.17 kg/m.  General: Alert, no acute distress HEENT:  Normocephalic, atraumatic, oropharynx patent. Nasal congestion.  Eye: [name] [facility] Cardiovascular:  Regular rate and rhythm, no rubs murmurs or gallops. No Carotid bruits, radial pulse intact. No pedal edema.  Respiratory: Decreased breath sounds in both bases but good air exchange. No wheezes, rales, or rhonchi.  No cyanosis, no use of accessory musculature.  Abdominal: No organomegaly, abdomen is soft and non-tender, positive bowel sounds.  No masses. Musculoskeletal: Gait intact. No edema, tenderness Skin: No rashes. 2x2 cm firm nodule just beneath skin inside L thigh with surrounding bruising  Neurologic: Facial musculature symmetric. Psychiatric: Patient acts appropriately throughout our interaction. Lymphatic: No cervical or submandibular lymphadenopathy Meds ordered this encounter  Medications  . predniSONE (DELTASONE) 10 MG tablet    Sig: 3 a day for 3 days 2 a day for 3 days one a day for 3 days    Dispense:  18 tablet    Refill:  0  . benzonatate (TESSALON) 100 MG capsule    Sig: Take 1-2 capsules (100-200 mg total) by mouth 3 (three) times daily as needed for cough.    Dispense:  40 capsule    Refill:  0   LABS: Results for orders placed or performed in visit on 07/12/16  POCT CBC  Result Value Ref Range   WBC 6.3 4.6 - 10.2 K/uL   Lymph, poc 1.4 0.6 - 3.4   POC LYMPH PERCENT 21.7 10 - 50 %L   MID (cbc) 0.5 0 - 0.9   POC MID % 7.8 0 -  12 %M   POC Granulocyte 4.4 2 - 6.9   Granulocyte percent 70.5 37 - 80 %G   RBC 4.67 (A) 4.69 - 6.13 M/uL   Hemoglobin 14.9 14.1 - 18.1 g/dL   HCT, POC 40.5 (A) 43.5 - 53.7 %   MCV 86.8 80 - 97 fL   MCH, POC 31.9 (A) 27 - 31.2 pg   MCHC 36.7 (A) 31.8 - 35.4 g/dL   RDW, POC 15.9 %   Platelet Count, POC 68 (  A) 142 - 424 K/uL   MPV 7.7 0 - 99.8 fL  POCT glucose (manual entry)  Result Value Ref Range   POC Glucose 99 70 - 99 mg/dl  POCT glycosylated hemoglobin (Hb A1C)  Result Value Ref Range   Hemoglobin A1C 5.5    EKG/XRAY:   Primary read interpreted by [facility]. Everlene Farrier at [facility]. [name] 2 View  Result Date: 07/12/2016 CLINICAL DATA:  Persistent cough and shortness breath for the past 5 days. EXAM: CHEST  2 VIEW COMPARISON:  Chest radiograph 11/30/2015. FINDINGS: Chronic RIGHT hemidiaphragm elevation. No active infiltrates or failure. Normal heart size. No pneumothorax. BILATERAL rotator cuff repairs. IMPRESSION: Stable chest.  No active disease. Electronically Signed   By: Staci Righter M.D.   On: 07/12/2016 11:04   Dg Femur Min 2 Views Left  Result Date: 07/12/2016 CLINICAL DATA:  Soft tissue mass with bruising. EXAM: LEFT FEMUR 2 VIEWS COMPARISON:  None. FINDINGS: There is no evidence of fracture or other focal bone lesions. No calcification of the soft tissues. IMPRESSION: No acute osseous findings are seen. No calcification of the soft tissues is evident. Electronically Signed   By: Staci Righter M.D.   On: 07/12/2016 11:11   ASSESSMENT/PLAN: I suspect this is more of an  allergy issue with reactive airways disease. He was hospitalized in April with what was felt to be a respiratory illness complicated by sleep apnea. I feel like he will respond best to use of his albuterol inhaler every 4-6 hours as well as Tessalon Perles for cough and prednisone in a taper. I have gone ahead and made him a ointment to follow-up with pulmonary on Monday. The area on his left leg does not show Calcification   I suspect this is a small hematoma especially with the presence of ecchymoses around the area and I suspect he may have bled because of his known thrombocytopenia.[name] He does not have any signs of a  PE. His normal d-dimer is reassuring. He has chronic right diaphragm elevation which complicates things. A peak flow of 420 with a pulse ox of 94 is very reassuring. His platelets are low and have been low and followed by hematology.I personally performed the services described in this documentation, which was scribed in my presence. The recorded information has been reviewed and is accurate.    Gross sideeffects, risk and benefits, and alternatives of medications d/w patient. Patient is aware that all medications have potential sideeffects and we are unable to predict every sideeffect or drug-drug interaction that may occur.  [name] MD 07/12/2016 10:07 AM

## 2016-07-12 NOTE — Patient Instructions (Addendum)
SEE DR.SOOD 07/15/16 215PM Please take medications as prescribed. Use inhaler every 4 hours as instructed. If you develop worsening shortness of breath or chest tightness please present to the emergency room.   IF you received an x-ray today, you will receive an invoice from [facility]. Please contact [facility] at [phone] with questions or concerns regarding your invoice.   IF you received labwork today, you will receive an invoice from [employer]. Please contact [facility] at [phone] with questions or concerns regarding your invoice.   Our billing staff will not be able to assist you with questions regarding bills from these companies.  You will be contacted with the lab results as soon as they are available. The fastest way to get your results is to activate your My Chart account. Instructions are located on the last page of this paperwork. If you have not heard from [place] regarding the results in 2 weeks, please contact this office.

## 2016-07-15 ENCOUNTER — Ambulatory Visit (INDEPENDENT_AMBULATORY_CARE_PROVIDER_SITE_OTHER): Payer: Medicare Other | Admitting: Pulmonary Disease

## 2016-07-15 ENCOUNTER — Encounter: Payer: Self-pay | Admitting: Pulmonary Disease

## 2016-07-15 VITALS — BP 126/72 | HR 87 | Ht 67.0 in | Wt 196.0 lb

## 2016-07-15 DIAGNOSIS — Z9989 Dependence on other enabling machines and devices: Secondary | ICD-10-CM

## 2016-07-15 DIAGNOSIS — R05 Cough: Secondary | ICD-10-CM | POA: Diagnosis not present

## 2016-07-15 DIAGNOSIS — I2510 Atherosclerotic heart disease of native coronary artery without angina pectoris: Secondary | ICD-10-CM | POA: Diagnosis not present

## 2016-07-15 DIAGNOSIS — G4733 Obstructive sleep apnea (adult) (pediatric): Secondary | ICD-10-CM | POA: Diagnosis not present

## 2016-07-15 DIAGNOSIS — J45909 Unspecified asthma, uncomplicated: Secondary | ICD-10-CM

## 2016-07-15 DIAGNOSIS — J986 Disorders of diaphragm: Secondary | ICD-10-CM

## 2016-07-15 DIAGNOSIS — R059 Cough, unspecified: Secondary | ICD-10-CM

## 2016-07-15 LAB — NITRIC OXIDE: NITRIC OXIDE: 42

## 2016-07-15 MED ORDER — AZITHROMYCIN 250 MG PO TABS: ORAL_TABLET | ORAL | 0 refills | Status: DC

## 2016-07-15 NOTE — Patient Instructions (Addendum)
Advair one puff twice per day until sample completed >> call if you feel you need a refill after this  Zithromax 250 mg pill >> 2 pills on day 1, then 1 pill daily for next four days  Albuterol 2 puffs every 4 to 6 hours as needed for cough, wheeze, or chest congestion  Finish course of prednisone  Call if not feeling better after 1 week  Follow up in 6 months

## 2016-07-15 NOTE — Progress Notes (Addendum)
Current Outpatient Prescriptions on File Prior to Visit  Medication Sig  . albuterol (PROVENTIL HFA;VENTOLIN HFA) 108 (90 Base) MCG/ACT inhaler Inhale 2 puffs into the lungs every 4 (four) hours as needed for wheezing or shortness of breath (cough, shortness of breath or wheezing.).  [name] amLODipine (NORVASC) 2.5 MG tablet TAKE 1 TABLET EVERY DAY  . aspirin 81 MG EC tablet Take 81 mg by mouth at bedtime.   . benzonatate (TESSALON) 100 MG capsule Take 1-2 capsules (100-200 mg total) by mouth 3 (three) times daily as needed for cough.  . predniSONE (DELTASONE) 10 MG tablet 3 a day for 3 days 2 a day for 3 days one a day for 3 days  . simvastatin (ZOCOR) 20 MG tablet TAKE 1 TABLET EVERY DAY WITH BREAKFAST   No current facility-administered medications on file prior to visit.      Chief Complaint  Patient presents with  . Acute Visit    Pt c/o increased cough and SOB - difficulty sleeping. Pt states that he coughs up clear phlegm which he can feel accumualting in his chest until he is able to cough it all up. Moderate nasal congestion     Pulmonary tests CT angio chest 11/30/15 >> motion artifact, mosaic pattern, calcified granuloma SNIFF test 11/30/15 >> elevated Rt diaphragm, but has b/l diaphragm movement  Cardiac tests 10/16/15 Doppler legs b/l >> no DVT 11/30/15 Echo >> EF 65 to [id number], grade 1 diastolic CHF  Sleep tests HST 12/21/15 >> AHI 44.5, SpO2 low 64% Auto CPAP 03/02/16 to 03/31/16 >> used on 26 of 30 nights with average 6 hrs 32 min.  Average AHI 5.5 with median CPAP 8 and 95 th percentile CPAP 11 cm H2O ONO with CPAP 04/10/16 >> test time 6 hrs 30 min.  Mean SpO2 93.51%, low SpO2 77%.  Spent 7 min with SpO2 < 88%  Past medical history Ankylosing spondylitis, Allergies, CAD, HLD, HTN, Nephrolithiasis, Malaria  Past surgical history, Family history, Social history, Allergies reviewed  Vital Signs BP 126/72 (BP Location: Left Arm, Cuff Size: Normal)   Pulse 87   Ht 5\' 7"  (1.702 m)    Wt 196 lb (88.9 kg)   SpO2 93%   BMI 30.70 kg/m   History of Present Illness [name] is a 75 y.o. male with OSA, Rt diaphragm elevation, and cough.  He developed a cough about a week ago.  He has been getting chest congestion and intermittent wheezing.  He then started getting sinus congestion.  He has been bringing up clear sputum.  He denies chest pain, or fever.  He has not had nausea, diarrhea, or leg swelling.  He had a rash on his left arm, and was seen by dermatology already for this >> no specific therapy.  He went to urgent care on 07/12/16.  CXR from then was unremarkable.  He was given prednisone and albuterol.  These have helped some, but he still feels like he is having trouble with his breathing and cough.  Due to his sinus congestion and cough, it has been difficult for him to use CPAP recently.  Prior to his recent respiratory symptoms he was compliant with CPAP.  FeNO today - 42.  Physical Exam  General - No distress ENT - No sinus tenderness, clear nasal discharge, no oral exudate, no LAN Cardiac - s1s2 regular, no murmur Chest - No wheeze/rales/dullness Back - No focal tenderness Abd - Soft, non-tender Ext - No edema Neuro - Normal strength  Skin - No rashes Psych - normal mood, and behavior   [name] 2 View  Result Date: 07/12/2016 CLINICAL DATA:  Persistent cough and shortness breath for the past 5 days. EXAM: CHEST  2 VIEW COMPARISON:  Chest radiograph 11/30/2015. FINDINGS: Chronic RIGHT hemidiaphragm elevation. No active infiltrates or failure. Normal heart size. No pneumothorax. BILATERAL rotator cuff repairs. IMPRESSION: Stable chest.  No active disease. Electronically Signed   By: Staci Righter M.D.   On: 07/12/2016 11:04   Dg Femur Min 2 Views Left  Result Date: 07/12/2016 CLINICAL DATA:  Soft tissue mass with bruising. EXAM: LEFT FEMUR 2 VIEWS COMPARISON:  None. FINDINGS: There is no evidence of fracture or other focal bone lesions. No  calcification of the soft tissues. IMPRESSION: No acute osseous findings are seen. No calcification of the soft tissues is evident. Electronically Signed   By: Staci Righter M.D.   On: 07/12/2016 11:11    Assessment/Plan  Acute bronchitis. - finish course of prednisone - trial of advair for two weeks >> he will call if he needs refill - prn albuterol - Zpak  Obstructive sleep apnea. Chronic Rt hemidiaphragm elevation. - he is compliant with therapy and reports benefit - continue auto CPAP   Patient Instructions  Advair one puff twice per day until sample completed >> call if you feel you need a refill after this  Zithromax 250 mg pill >> 2 pills on day 1, then 1 pill daily for next four days  Albuterol 2 puffs every 4 to 6 hours as needed for cough, wheeze, or chest congestion  Finish course of prednisone  Call if not feeling better after 1 week  Follow up in 6 months    Chesley Mires, MD [place] [facility] Pager:  [phone] 07/15/2016, 2:28 PM

## 2016-07-22 ENCOUNTER — Ambulatory Visit (INDEPENDENT_AMBULATORY_CARE_PROVIDER_SITE_OTHER): Payer: Medicare Other | Admitting: Internal Medicine

## 2016-07-22 ENCOUNTER — Telehealth: Payer: Self-pay | Admitting: Pulmonary Disease

## 2016-07-22 ENCOUNTER — Encounter: Payer: Self-pay | Admitting: Internal Medicine

## 2016-07-22 VITALS — BP 120/66 | HR 77 | Temp 98.1°F | Ht 67.0 in | Wt 194.0 lb

## 2016-07-22 DIAGNOSIS — I2510 Atherosclerotic heart disease of native coronary artery without angina pectoris: Secondary | ICD-10-CM | POA: Diagnosis not present

## 2016-07-22 DIAGNOSIS — J45991 Cough variant asthma: Secondary | ICD-10-CM | POA: Insufficient documentation

## 2016-07-22 MED ORDER — TRAMADOL HCL 50 MG PO TABS: ORAL_TABLET | ORAL | 0 refills | Status: DC

## 2016-07-22 MED ORDER — FAMOTIDINE 20 MG PO TABS: ORAL_TABLET | ORAL | Status: DC

## 2016-07-22 MED ORDER — BUDESONIDE-FORMOTEROL FUMARATE 80-4.5 MCG/ACT IN AERO: 2.0000 | INHALATION_SPRAY | Freq: Two times a day (BID) | RESPIRATORY_TRACT | 11 refills | Status: DC

## 2016-07-22 MED ORDER — OMEPRAZOLE MAGNESIUM 20 MG PO TBEC: 20.0000 mg | DELAYED_RELEASE_TABLET | Freq: Every day | ORAL | Status: DC

## 2016-07-22 NOTE — Progress Notes (Signed)
[age] yow[age] never smoker with new onset cough since Oct 2016 s background of asthma but with background of nasal allergies for which took shots and moved residence which seemed to helped the nasal symptoms but not the cough .   History of Present Illness  Congestion in chest x Oct 2016 worse in evenings with mostly dry coughing and much worse x sev weeks > Sood eval  rec Advair one puff twice per day until sample completed >> call if you feel you need a refill after this Zithromax 250 mg pill >> 2 pills on day 1, then 1 pill daily for next four days Albuterol 2 puffs every 4 to 6 hours as needed for cough, wheeze, or chest congestion Finish course of prednisone    07/22/2016 acute extended ov/[name] re:  Refractory cough > sob on advair and pred taper and getting worse Chief Complaint  Patient presents with  . Acute Visit    Increased SOB and cough are not improving since acute ov here 07/15/16. His cough has been occ prod with light colored sputum.  He states that his breathing does okay in the am's and then gets worse in the evenings.   on advair dpi/ benzoate and took pred 10 mg one tablet  This am and cough seems ok when he wakes up and progressively worse with Minimal actual sputum production as the day goes on and then does not really bother him while sleeping.. Shaking on details of care/ names of meds/ no better with saba   No obvious day to day or daytime variability or assoc   mucus plugs or hemoptysis or cp or chest tightness, subjective wheeze or overt sinus or hb symptoms. No unusual exp hx or h/o childhood pna/ asthma or knowledge of premature birth.  Sleeping ok without nocturnal  or early am exacerbation  of respiratory  c/o's or need for noct saba. Also denies any obvious fluctuation of symptoms with weather or environmental changes or other aggravating or alleviating factors except as outlined above   Current Medications, Allergies, Complete Past Medical History, Past Surgical  History, Family History, and Social History were reviewed in [employer] record.  ROS  The following are not active complaints unless bolded sore throat, dysphagia, dental problems, itching, sneezing,  nasal congestion or excess/ purulent secretions, ear ache,   fever, chills, sweats, unintended wt loss, classically pleuritic or exertional cp,  orthopnea pnd or leg swelling, presyncope, palpitations, abdominal pain, anorexia, nausea, vomiting, diarrhea  or change in bowel or bladder habits, change in stools or urine, dysuria,hematuria,  rash, arthralgias, visual complaints, headache, numbness, weakness or ataxia or problems with walking or coordination,  change in mood/affect or memory.          Pulmonary tests CT angio chest 11/30/15 >> motion artifact, mosaic pattern, calcified granuloma SNIFF test 11/30/15 >> elevated Rt diaphragm, but has b/l diaphragm movement  Cardiac tests 10/16/15 Doppler legs b/l >> no DVT 11/30/15 Echo >> EF 65 to [id number], grade 1 diastolic CHF  Sleep tests HST 12/21/15 >> AHI 44.5, SpO2 low 64% Auto CPAP 03/02/16 to 03/31/16 >> used on 26 of 30 nights with average 6 hrs 32 min.  Average AHI 5.5 with median CPAP 8 and 95 th percentile CPAP 11 cm H2O ONO with CPAP 04/10/16 >> test time 6 hrs 30 min.  Mean SpO2 93.51%, low SpO2 77%.  Spent 7 min with SpO2 < 88%   Past medical history Ankylosing spondylitis, Allergies, CAD,  HLD, HTN, Nephrolithiasis, Malaria      Physical Exam  amb obese wm nad with audible pseudowheezing   Wt Readings from Last 3 Encounters:  07/22/16 194 lb (88 kg)  07/15/16 196 lb (88.9 kg)  07/12/16 192 lb 9.6 oz (87.4 kg)    Vital signs reviewed    - Note on arrival 02 sats  94% on RA        General - No distress ENT - No sinus tenderness, clear nasal discharge, no oral exudate, no LAN Cardiac - s1s2 regular, no murmur Chest - No wheeze/rales/dullness Back - No focal tenderness Abd - Soft, non-tender Ext - No  edema Neuro - Normal strength Skin - No rashes Psych - normal mood, and behavior    I personally reviewed images and agree with radiology impression as follows:  CXR:   07/12/16 Stable chest.  No active disease.    Assessment/Plan

## 2016-07-22 NOTE — Patient Instructions (Addendum)
Take delsym two tsp every 12 hours and supplement if needed with  tramadol 50 mg up to 1 every 4 hours to suppress the urge to cough. Swallowing water or using ice chips/non mint and menthol containing candies (such as lifesavers or sugarless jolly ranchers) are also effective.  You should rest your voice and avoid activities that you know make you cough.  Once you have eliminated the cough for 3 straight days try reducing the tramadol first,  then the delsym as tolerated.     Symbicort 80 Take 2 puffs first thing in am and then another 2 puffs about 12 hours later - if helping refill it  Only use your albuterol (ventolin) as a rescue medication to be used if you can't catch your breath by resting or doing a relaxed purse lip breathing pattern.  - The less you use it, the better it will work when you need it. - Ok to use up to 2 puffs  every 4 hours if you must but call for immediate appointment if use goes up over your usual need - Don't leave home without it !!  (think of it like the spare tire for your car)   Finish prednisone  As long as coughing at all:  Try prilosec otc 20mg   Take 30-60 min before first meal of the day and Pepcid ac (famotidine) 20 mg one @  bedtime until cough is completely gone for at least a week without the need for cough suppression    GERD (REFLUX)  is an extremely common cause of respiratory symptoms just like yours , many times with no obvious heartburn at all.    It can be treated with medication, but also with lifestyle changes including elevation of the head of your bed (ideally with 6 inch  bed blocks),  Smoking cessation, avoidance of late meals, excessive alcohol, and avoid fatty foods, chocolate, peppermint, colas, red wine, and acidic juices such as orange juice.  NO MINT OR MENTHOL PRODUCTS SO NO COUGH DROPS   USE SUGARLESS CANDY INSTEAD (Jolley ranchers or Stover's or Life Savers) or even ice chips will also do - the key is to swallow to prevent all  throat clearing. NO OIL BASED VITAMINS - use powdered substitutes.   Please schedule a follow up office visit in 2 weeks, sooner if needed to see our NP will all medications in hand

## 2016-07-22 NOTE — Telephone Encounter (Signed)
Spoke with the pt He is asking if he should continue on tessalon  I advised no, per AVS he needs to take delsym and tramadol for the cough  Also, he wanted to verify that he should not be taking advair  I advised no advair, take the symbicort  Pt verbalized understanding and states nothing further needed

## 2016-07-23 NOTE — Telephone Encounter (Signed)
Will close encounter

## 2016-07-24 ENCOUNTER — Encounter: Payer: Self-pay | Admitting: Internal Medicine

## 2016-07-24 NOTE — Assessment & Plan Note (Signed)
Body mass index is 30.38 kg/m.  Lab Results  Component Value Date   TSH 3.16 10/26/2013     Contributing to gerd tendency/ doe/reviewed the need and the process to achieve and maintain neg calorie balance > defer f/u primary care including intermittently monitoring thyroid status

## 2016-07-24 NOTE — Assessment & Plan Note (Addendum)
07/22/2016  After extensive coaching HFA effectiveness =    75% > try reduce symbicort to 80 2bid  - FENO 07/15/2016  =   42     Symptoms are markedly disproportionate to objective findings and not clear this is a lung problem but pt does appear to have difficult airway management issues. DDX of  difficult airways management almost all start with A and  include Adherence, Ace Inhibitors, Acid Reflux, Active Sinus Disease, Alpha 1 Antitripsin deficiency, Anxiety masquerading as Airways dz,  ABPA,  Allergy(esp in young), Aspiration (esp in elderly), Adverse effects of meds,  Active smokers, A bunch of PE's (a small clot burden can't cause this syndrome unless there is already severe underlying pulm or vascular dz with poor reserve) plus two Bs  = Bronchiectasis and Beta blocker use..and one C= CHF   Adherence is always the initial "prime suspect" and is a multilayered concern that requires a "trust but verify" approach in every patient - starting with knowing how to use medications, especially inhalers, correctly, keeping up with refills and understanding the fundamental difference between maintenance and prns vs those medications only taken for a very short course and then stopped and not refilled.  - shaky on details of care. rec f/u in 2 weeks with all meds in hand to do meaningful an accurate medication reconciliation.  ? Acid (or non-acid) GERD > always difficult to exclude as up to 75% of pts in some series report no assoc GI/ Heartburn symptoms> rec max (24h)  acid suppression and diet restrictions/ reviewed and instructions given in writing.  - Of the three most common causes of chronic cough, only one (GERD)  can actually cause the other two (asthma and post nasal drip syndrome)  and perpetuate the cylce of cough inducing airway trauma, inflammation, heightened sensitivity to reflux which is prompted by the cough itself via a cyclical mechanism.    This may partially respond to steroids and look  like asthma and post nasal drainage but never erradicated completely unless the cough and the secondary reflux are eliminated, preferably both at the same time.  While not intuitively obvious, many patients with chronic low grade reflux do not cough until there is a secondary insult that disturbs the protective epithelial barrier and exposes sensitive nerve endings.  This can be viral or direct physical injury such as with an endotracheal tube.   The point is that once this occurs, it is difficult to eliminate using anything but a maximally effective acid suppression regimen at least in the short run, accompanied by an appropriate diet to address non acid GERD.   ? Adverse effects of meds ? Dpi advair > try hfa low dose ICS   ? Allergy> note last cbc had no eos but may benefit from IgE levels/RAST testing   ? abpa > consider IgE levels next    Will re-reval in 2 weeks after eliminating cyclical cough first with tramadol  I had an extended discussion with the patient reviewing all relevant studies completed to date and  lasting 25 minutes of a 40  minute acute  Visit in pt not previously known to me   re  non-specific but potentially very serious pulmonary symptoms of unknown etiology.  Each maintenance medication was reviewed in detail including most importantly the difference between maintenance and prns and under what circumstances the prns are to be triggered using an action plan format that is not reflected in the computer generated alphabetically organized AVS.  Please see AVS for unique instructions that I personally wrote and verbalized to the the pt in detail and then reviewed with pt  by my nurse highlighting any  changes in therapy recommended at today's visit to their plan of care.

## 2016-07-29 ENCOUNTER — Telehealth: Payer: Self-pay | Admitting: Internal Medicine

## 2016-07-29 NOTE — Telephone Encounter (Signed)
LMOMTCB x 1 

## 2016-07-29 NOTE — Telephone Encounter (Signed)
Pt returning call.[name] ° °

## 2016-07-29 NOTE — Telephone Encounter (Signed)
Patient is scheduled to see Dr. Melvyn Novas for cough and light-headed

## 2016-07-29 NOTE — Telephone Encounter (Signed)
Called and spoke to pt. Pt states he is ok waiting until tomorrow 07/30/16 to see MW for his s/s. Nothing further needed at this time.

## 2016-07-30 ENCOUNTER — Ambulatory Visit (INDEPENDENT_AMBULATORY_CARE_PROVIDER_SITE_OTHER): Payer: Medicare Other | Admitting: Internal Medicine

## 2016-07-30 ENCOUNTER — Encounter: Payer: Self-pay | Admitting: Internal Medicine

## 2016-07-30 VITALS — BP 108/66 | HR 65 | Ht 67.0 in | Wt 191.0 lb

## 2016-07-30 DIAGNOSIS — I2510 Atherosclerotic heart disease of native coronary artery without angina pectoris: Secondary | ICD-10-CM

## 2016-07-30 DIAGNOSIS — J45991 Cough variant asthma: Secondary | ICD-10-CM

## 2016-07-30 MED ORDER — OMEPRAZOLE MAGNESIUM 20 MG PO TBEC: DELAYED_RELEASE_TABLET | ORAL | Status: DC

## 2016-07-30 MED ORDER — PREDNISONE 10 MG PO TABS: ORAL_TABLET | ORAL | 0 refills | Status: DC

## 2016-07-30 MED ORDER — MOMETASONE FURO-FORMOTEROL FUM 100-5 MCG/ACT IN AERO: 2.0000 | INHALATION_SPRAY | Freq: Two times a day (BID) | RESPIRATORY_TRACT | 0 refills | Status: DC

## 2016-07-30 NOTE — Assessment & Plan Note (Signed)
07/22/2016    > try reduce symbicort to 80 2bid  - FENO 07/15/2016  =   42 on sym 160 2bid  - 07/30/2016  After extensive coaching HFA effectiveness =    90% > continue symb 80 2bid  - Sinus CT 07/30/2016 >>>   Lack of cough resolution on a verified empirical regimen could mean an alternative diagnosis (asthma vs  uacs still not entirely clear so treat both  )  persistence of the disease state (eg sinusitis - sinus ct ordered - or bronchiectasis) , or inadequacy of currently available therapy (eg no medical rx available for non-acid gerd (in pt with incessant coughing causing worse gerd which is a likely scenario here).  Of the three most common causes of chronic cough, only one (GERD)  can actually cause the other two (asthma and post nasal drip syndrome)  and perpetuate the cylce of cough inducing airway trauma, inflammation, heightened sensitivity to reflux which is prompted by the cough itself via a cyclical mechanism.    This may partially respond to steroids and look like asthma and post nasal drainage but never erradicated completely unless the cough and the secondary reflux are eliminated, preferably both at the same time.  While not intuitively obvious, many patients with chronic low grade reflux do not cough until there is a secondary insult that disturbs the protective epithelial barrier and exposes sensitive nerve endings.  This can be viral(which is the likely source here) or direct physical injury such as with an endotracheal tube.   The point is that once this occurs, it is difficult to eliminate using anything but a maximally effective acid suppression regimen at least in the short run, accompanied by an appropriate diet to address non acid GERD.   I had an extended discussion with the patient reviewing all relevant studies completed to date and  lasting 25 minutes of a 40  minute office visit  re refractory  non-specific but potentially very serious pulmonary symptoms of unknown  etiology.  Each maintenance medication was reviewed in detail including most importantly the difference between maintenance and prns and under what circumstances the prns are to be triggered using an action plan format that is not reflected in the computer generated alphabetically organized AVS.    Please see AVS for unique instructions that I personally wrote and verbalized to the the pt in detail and then reviewed with pt  by my nurse highlighting any  changes in therapy recommended at today's visit to their plan of care.

## 2016-07-30 NOTE — Patient Instructions (Addendum)
Prednisone 10 mg take  4 each am x 2 days,   2 each am x 2 days,  1 each am x 2 days and stop   Increase the prilosec ac to where you take x 2 30-60 mn before breakfast  And continue the pepcid ac 20mg  in evening   Continue symbicort 80 (same as dulera 100 sample) Take 2 puffs first thing in am and then another 2 puffs about 12 hours later.   Only use your albuterol as a rescue medication to be used if you can't catch your breath by resting or doing a relaxed purse lip breathing pattern.  - The less you use it, the better it will work when you need it. - Ok to use up to 2 puffs  every 4 hours if you must but call for immediate appointment if use goes up over your usual need - Don't leave home without it !!  (think of it like the spare tire for your car)   For cough use mucinex dm 1200 mg every 12 hours and supplement with the tramadol up to 2 every 4 hours until no cough x 3 days   Please see patient coordinator before you leave today  to schedule sinus CT  Keep appt to see Tammy NP

## 2016-07-30 NOTE — Progress Notes (Signed)
[age] yowm never smoker with new onset variable  cough since Oct 2016 comes and goes s background of asthma but with background of nasal allergies for which took shots and moved residence which seemed to helped the nasal symptoms but not the cough .   History of Present Illness  Congestion in chest x Oct 2016 worse in evenings with mostly dry coughing and much worse x sev weeks > Sood eval on 07/15/16 cough for a week/ albterol and pred no better  rec Advair one puff twice per day until sample completed >> call if you feel you need a refill after this Zithromax 250 mg pill >> 2 pills on day 1, then 1 pill daily for next four days Albuterol 2 puffs every 4 to 6 hours as needed for cough, wheeze, or chest congestion Finish course of prednisone    07/22/2016 acute extended ov/Keita Demarco re:  Refractory cough > sob on advair and pred taper and getting worse Chief Complaint  Patient presents with  . Acute Visit    Increased SOB and cough are not improving since acute ov here 07/15/16. His cough has been occ prod with light colored sputum.  He states that his breathing does okay in the am's and then gets worse in the evenings.   on advair dpi/ benzoate and took pred 10 mg one tablet  This am and cough seems ok when he wakes up and progressively worse with Minimal actual sputum production as the day goes on and then does not really bother him while sleeping.. Shaking on details of care/ names of meds/ no better with saba  rec Take delsym two tsp every 12 hours and supplement if needed with  tramadol 50 mg up to 1 every 4 hours  .   Symbicort 80 Take 2 puffs first thing in am and then another 2 puffs about 12 hours later - if helping refill it Only use your albuterol (ventolin) inhaler  As long as coughing at all:  Try prilosec otc 20mg   Take 30-60 min before first meal of the day and Pepcid ac (famotidine) 20 mg one @  bedtime until cough is completely gone for at least a week without the need for cough  suppression GERD diet    07/30/2016 acute extended ov/Liyah Higham re: refractory cough since late Nov 2017  Chief Complaint  Patient presents with  . Acute Visit    Pt c/o increased cough and congestion for the past 2 wks. He states symptoms get worse in the evening. Cough is prod in the am's with thick, clear sputum.  He states he can not lie down flat due to cough and cough sometimes waked him up. He also c/o wheezing.   brought all his meds, not using symb 2bid per count, only missing 11 tramadol "it's for pain, right? "  Some better while on pred, worse since finished it/ only taking prilosec otc 20 mg qam / is taking pepcid 20 mg hs  Not using any saba "it's for emergencies, right?"   No obvious day to day or daytime variability or assoc   purulent sputum or mucus plugs or hemoptysis or cp or chest tightness, subjective wheeze or overt sinus or hb symptoms. No unusual exp hx or h/o childhood pna/ asthma or knowledge of premature birth.   Also denies any obvious fluctuation of symptoms with weather or environmental changes or other aggravating or alleviating factors except as outlined above   Current Medications, Allergies, Complete Past Medical History,  Past Surgical History, Family History, and Social History were reviewed in [employer] record.  ROS  The following are not active complaints unless bolded sore throat, dysphagia, dental problems, itching, sneezing,  nasal congestion or excess/ purulent secretions, ear ache,   fever, chills, sweats, unintended wt loss, classically pleuritic or exertional cp,  orthopnea pnd or leg swelling, presyncope, palpitations, abdominal pain, anorexia, nausea, vomiting, diarrhea  or change in bowel or bladder habits, change in stools or urine, dysuria,hematuria,  rash, arthralgias, visual complaints, headache, numbness, weakness or ataxia or problems with walking or coordination,  change in mood/affect or memory.         Pulmonary  tests CT angio chest 11/30/15 >> motion artifact, mosaic pattern, calcified granuloma SNIFF test 11/30/15 >> elevated Rt diaphragm, but has b/l diaphragm movement  Cardiac tests 10/16/15 Doppler legs b/l >> no DVT 11/30/15 Echo >> EF 65 to [id number], grade 1 diastolic CHF  Sleep tests HST 12/21/15 >> AHI 44.5, SpO2 low 64% Auto CPAP 03/02/16 to 03/31/16 >> used on 26 of 30 nights with average 6 hrs 32 min.  Average AHI 5.5 with median CPAP 8 and 95 th percentile CPAP 11 cm H2O ONO with CPAP 04/10/16 >> test time 6 hrs 30 min.  Mean SpO2 93.51%, low SpO2 77%.  Spent 7 min with SpO2 < 88%   Past medical history Ankylosing spondylitis, Allergies, CAD, HLD, HTN, Nephrolithiasis, Malaria      Physical Exam  amb obese wm nad with audible pseudowheezing  07/30/2016     191  07/22/16 194 lb (88 kg)  07/15/16 196 lb (88.9 kg)  07/12/16 192 lb 9.6 oz (87.4 kg)    Vital signs reviewed    - Note on arrival 02 sats  94% on RA        General - No distress ENT - No sinus tenderness, clear nasal discharge, no oral exudate, no LAN Cardiac - s1s2 regular, no murmur Chest -  A few insp pops/squeaks/ exp wheeze cough at end exp  Back - No focal tenderness Abd - Soft, non-tender Ext - No edema Neuro - Normal strength Skin - No rashes Psych - normal mood, and behavior    I personally reviewed images and agree with radiology impression as follows:  CXR:   07/12/16 Stable chest.  No active disease.    Assessment/Plan

## 2016-07-31 ENCOUNTER — Ambulatory Visit (INDEPENDENT_AMBULATORY_CARE_PROVIDER_SITE_OTHER)
Admission: RE | Admit: 2016-07-31 | Discharge: 2016-07-31 | Disposition: A | Discharge status: Home / Self Care | Payer: Medicare Other | Source: Ambulatory Visit | Attending: Internal Medicine | Admitting: Internal Medicine

## 2016-07-31 DIAGNOSIS — J45991 Cough variant asthma: Secondary | ICD-10-CM | POA: Diagnosis not present

## 2016-07-31 DIAGNOSIS — R05 Cough: Secondary | ICD-10-CM | POA: Diagnosis not present

## 2016-07-31 NOTE — Progress Notes (Signed)
Spoke with pt and notified of results per Dr. Wert. Pt verbalized understanding and denied any questions. 

## 2016-08-21 ENCOUNTER — Ambulatory Visit (INDEPENDENT_AMBULATORY_CARE_PROVIDER_SITE_OTHER)
Admission: RE | Admit: 2016-08-21 | Discharge: 2016-08-21 | Disposition: A | Discharge status: Home / Self Care | Payer: Medicare Other | Source: Ambulatory Visit | Attending: Adult Health | Admitting: Adult Health

## 2016-08-21 ENCOUNTER — Encounter: Payer: Self-pay | Admitting: Adult Health

## 2016-08-21 ENCOUNTER — Ambulatory Visit (INDEPENDENT_AMBULATORY_CARE_PROVIDER_SITE_OTHER): Payer: Medicare Other | Admitting: Adult Health

## 2016-08-21 ENCOUNTER — Telehealth: Payer: Self-pay | Admitting: Adult Health

## 2016-08-21 VITALS — BP 120/60 | HR 59 | Temp 98.2°F | Ht 67.0 in | Wt 189.2 lb

## 2016-08-21 DIAGNOSIS — R0602 Shortness of breath: Secondary | ICD-10-CM

## 2016-08-21 DIAGNOSIS — J301 Allergic rhinitis due to pollen: Secondary | ICD-10-CM

## 2016-08-21 DIAGNOSIS — J45991 Cough variant asthma: Secondary | ICD-10-CM | POA: Diagnosis not present

## 2016-08-21 DIAGNOSIS — R05 Cough: Secondary | ICD-10-CM | POA: Diagnosis not present

## 2016-08-21 MED ORDER — AZITHROMYCIN 250 MG PO TABS: ORAL_TABLET | ORAL | 0 refills | Status: DC

## 2016-08-21 MED ORDER — BUDESONIDE-FORMOTEROL FUMARATE 80-4.5 MCG/ACT IN AERO: 2.0000 | INHALATION_SPRAY | Freq: Two times a day (BID) | RESPIRATORY_TRACT | 11 refills | Status: DC

## 2016-08-21 NOTE — Assessment & Plan Note (Signed)
Flare with associated bronchitis -also may be recurrent flare since stopping Symbicort and trigger control rx.  Will restart.  Spirometry shows some moderate restriction w/out obstruction . Would like to get PFT on return if DLCO is decreased can consider HRCT Chest to make sure no sign of interstitial lung changes. (chronic cough )   Plan  Patient Instructions  Restart Symbicort 2 puffs Twice daily  .  Restart Prilosec 20mg  daily before meal  Restart Pepcid 20mg  At bedtime   May use Zyrtec 10mg  At bedtime  As needed  Drainage  Zpack take as directed.  Mucinex DM Twice daily  As needed  Cough/congestion .  Chest xray today .  Follow up Dr. Melvyn Novas  In 6-8 weeks with PFT  and As needed   Please contact office for sooner follow up if symptoms do not improve or worsen or seek emergency care

## 2016-08-21 NOTE — Patient Instructions (Addendum)
Restart Symbicort 2 puffs Twice daily  .  Restart Prilosec 20mg  daily before meal  Restart Pepcid 20mg  At bedtime   May use Zyrtec 10mg  At bedtime  As needed  Drainage  Zpack take as directed.  Mucinex DM Twice daily  As needed  Cough/congestion .  Chest xray today .  Follow up Dr. Melvyn Novas  In 6-8 weeks with PFT  and As needed   Please contact office for sooner follow up if symptoms do not improve or worsen or seek emergency care

## 2016-08-21 NOTE — Assessment & Plan Note (Signed)
Add zyrtec At bedtime  As needed    

## 2016-08-21 NOTE — Telephone Encounter (Signed)
zpak was not sent in per avs instructions from this morning.  This has been sent in. Spoke with pt, aware of this.  Nothing further needed.

## 2016-08-21 NOTE — Progress Notes (Signed)
Chart and office note reviewed in detail  > agree with a/p as outlined    

## 2016-08-21 NOTE — Progress Notes (Signed)
@Patient  ID: [name], male    DOB: [birth year], 75 y.o.   MRN: [id number]  Chief Complaint  Patient presents with  . Follow-up    Asthma     Referring provider: Adams-Doolittle, Benjam*  HPI: 76 yo male never smoker seen for initial pulmonary consult for cough 05/2015 felt to have cough variant asthma with AR .    TEST Joya San :  CTA Chest 11/2015 neg for PE , motion artifact.  Echo 11/2015 EF 65-70%, Gr 1 DD  07/22/2016    > try reduce symbicort to 80 2bid  - FENO 07/15/2016  =   42 on sym 160 2bid  - 07/30/2016  After extensive coaching HFA effectiveness =    90% > continue symb 80 2bid  - Sinus CT 07/31/2016 >>> neg -chronic elevation of right hemidiaphragm but neg sniff test 11/2015  -CBC 06/2016 eos neg    08/21/2016 Follow up : Asthma  Pt returns for 3 week follow up . Seen last ov with asthma exacerbation , tx/ steroid taper. He did get better but cough returned last few days. Now coughing up thick mucus with yellow tint, along nasal congestion that is worse at night. This am coughed very hard and saw trace blood mixed in yellow mucus. No fever or n/v.d  Had CT sinus last month that was neg.  He misunderstood instructions from previous visits. He stops his inhaler and PPI/H2 blocker when he gets better . We discussed maintenance meds and need to restart .  Spirometry today shows FEV1 81%, ratio 85%, FVC 69%.     No Known Allergies  Immunization History  Administered Date(s) Administered  . Influenza Split 06/01/2012  . Influenza Whole 05/30/2009, 04/20/2010  . Influenza-Unspecified 07/12/2013, 05/10/2016  . Pneumococcal Polysaccharide-23 08/22/2009  . Tdap 12/04/2010, 06/04/2011  . Zoster 05/17/2008    Past Medical History:  Diagnosis Date  . Abnormal chest x-ray   . Allergic rhinitis   . Ankylosing spondylitis ([place])   . Anxiety   . Arthritis   . Colonic polyp   . Coronary atherosclerosis of native coronary artery   . Cough   . Ganglion cyst of  wrist    left  . Heart attack 2008  . Hemorrhoids   . History of malaria   . Hyperlipidemia    with low HDL  . Hypertension   . Kidney stone   . Other disorder of muscle, ligament, and fascia   . Rotator cuff tear   . Swelling of lower extremity 10/16/2015    Tobacco History: History  Smoking Status  . Never Smoker  Smokeless Tobacco  . Never Used   Counseling given: Not Answered   Outpatient Encounter Prescriptions as of 08/21/2016  Medication Sig  . albuterol (PROVENTIL HFA;VENTOLIN HFA) 108 (90 Base) MCG/ACT inhaler Inhale 2 puffs into the lungs every 4 (four) hours as needed for wheezing or shortness of breath (cough, shortness of breath or wheezing.).  [name] amLODipine (NORVASC) 2.5 MG tablet TAKE 1 TABLET EVERY DAY  . aspirin 81 MG EC tablet Take 81 mg by mouth at bedtime.   . simvastatin (ZOCOR) 20 MG tablet TAKE 1 TABLET EVERY DAY WITH BREAKFAST  . budesonide-formoterol (SYMBICORT) 80-4.5 MCG/ACT inhaler Inhale 2 puffs into the lungs 2 (two) times daily.  . famotidine (PEPCID) 20 MG tablet One at bedtime (Patient not taking: Reported on 08/21/2016)  . omeprazole (PRILOSEC OTC) 20 MG tablet Take x 2 x 30 min before bfast (Patient not taking: Reported  on 08/21/2016)  . [DISCONTINUED] budesonide-formoterol (SYMBICORT) 80-4.5 MCG/ACT inhaler Inhale 2 puffs into the lungs 2 (two) times daily. (Patient not taking: Reported on 08/21/2016)  . [DISCONTINUED] predniSONE (DELTASONE) 10 MG tablet Take  4 each am x 2 days,   2 each am x 2 days,  1 each am x 2 days and stop (Patient not taking: Reported on 08/21/2016)   No facility-administered encounter medications on file as of 08/21/2016.      Review of Systems  Constitutional:   No  weight loss, night sweats,  Fevers, chills, fatigue, or  lassitude.  HEENT:   No headaches,  Difficulty swallowing,  Tooth/dental problems, or  Sore throat,                No sneezing, itching, ear ache,  +nasal congestion, post nasal drip,   CV:  No  chest pain,  Orthopnea, PND, swelling in lower extremities, anasarca, dizziness, palpitations, syncope.   GI  No heartburn, indigestion, abdominal pain, nausea, vomiting, diarrhea, change in bowel habits, loss of appetite, bloody stools.   Resp:    No chest wall deformity  Skin: no rash or lesions.  GU: no dysuria, change in color of urine, no urgency or frequency.  No flank pain, no hematuria   MS:  No joint pain or swelling.  No decreased range of motion.  No back pain.    Physical Exam  BP 120/60 (Cuff Size: Normal)   Pulse (!) 59   Temp 98.2 F (36.8 C) (Oral)   Ht 5\' 7"  (1.702 m)   Wt 189 lb 3.2 oz (85.8 kg)   SpO2 93%   BMI 29.63 kg/m   GEN: A/Ox3; pleasant , NAD, well nourished    HEENT:  [place]/AT,  EACs-clear, TMs-wnl, NOSE-clear drainage , THROAT-clear, no lesions, no postnasal drip or exudate noted. Class 3 MP airway   NECK:  Supple w/ fair ROM; no JVD; normal carotid impulses w/o bruits; no thyromegaly or nodules palpated; no lymphadenopathy.    RESP  Clear  P & A; w/o, wheezes/ rales/ or rhonchi. no accessory muscle use, no dullness to percussion  CARD:  RRR, no m/r/g, no peripheral edema, pulses intact, no cyanosis or clubbing.  GI:   Soft & nt; nml bowel sounds; no organomegaly or masses detected.   Musco: Warm bil, no deformities or joint swelling noted.   Neuro: alert, no focal deficits noted.    Skin: Warm, no lesions or rashes  Psych:  No change in mood or affect. No depression or anxiety.  No memory loss.  Lab Results:  CBC    Component Value Date/Time   WBC 6.3 07/12/2016 1123   WBC 4.6 06/24/2016 1154   RBC 4.67 (A) 07/12/2016 1123   RBC 4.93 06/24/2016 1154   HGB 14.9 07/12/2016 1123   HGB 14.8 06/24/2016 1154   HGB 14.3 04/22/2016 0758   HCT 40.5 (A) 07/12/2016 1123   HCT 44.2 06/24/2016 1154   HCT 42.9 04/22/2016 0758   PLT 92 (L) 06/24/2016 1154   PLT 68 (L) 04/22/2016 0758   MCV 86.8 07/12/2016 1123   MCV 89.2 04/22/2016 0758    MCH 31.9 (A) 07/12/2016 1123   MCH 30.0 06/24/2016 1154   MCHC 36.7 (A) 07/12/2016 1123   MCHC 33.5 06/24/2016 1154   RDW 15.7 (H) 06/24/2016 1154   RDW 15.5 (H) 04/22/2016 0758   LYMPHSABS 1,058 06/24/2016 1154   LYMPHSABS 0.9 04/22/2016 0758   MONOABS 736 06/24/2016 1154  MONOABS 0.5 04/22/2016 0758   EOSABS 0 (L) 06/24/2016 1154   EOSABS 0.0 04/22/2016 0758   BASOSABS 0 06/24/2016 1154   BASOSABS 0.1 04/22/2016 0758    BMET    Component Value Date/Time   NA 142 04/22/2016 0758   K 3.9 04/22/2016 0758   CL 105 11/30/2015 0035   CL 106 09/30/2012 0949   CO2 25 04/22/2016 0758   GLUCOSE 121 04/22/2016 0758   GLUCOSE 198 (H) 09/30/2012 0949   BUN 14.9 04/22/2016 0758   CREATININE 0.9 04/22/2016 0758   CALCIUM 9.1 04/22/2016 0758   GFRNONAA 84 11/28/2015 1008   GFRAA >89 11/28/2015 1008    BNP    Component Value Date/Time   BNP 17.2 11/28/2015 1008    ProBNP    Component Value Date/Time   PROBNP 53.0 09/12/2006 1303    Imaging: [name] 2 View  Result Date: 08/21/2016 CLINICAL DATA:  Hemoptysis today. Three months of cough. History of hypertension, previous MI, coronary stent placement. EXAM: CHEST  2 VIEW COMPARISON:  Chest x-ray dated July 12, 2016 FINDINGS: There is chronic elevation of the right hemidiaphragm. The interstitial markings of both lungs are coarse though stable. There is no alveolar infiltrate or pleural effusion. The heart and pulmonary vascularity are normal. There is calcification in the wall of the aortic arch. There is mild multilevel degenerative disc disease of the thoracic spine with calcification of the anterior longitudinal ligament. There degenerative changes of the left AC joint. IMPRESSION: Chronic right hemidiaphragm elevation. No acute cardiopulmonary abnormality. Thoracic aortic atherosclerosis. Electronically Signed   By: David  Martinique M.D.   On: 08/21/2016 10:08   Ct Maxillofacial Limited Wo Contrast  Result Date:  07/31/2016 CLINICAL DATA:  Cough for several months EXAM: CT PARANASAL SINUS LIMITED WITHOUT CONTRAST TECHNIQUE: Non-contiguous multidetector CT images of the paranasal sinuses were obtained in a single plane without contrast. COMPARISON:  None. FINDINGS: Visualized paranasal sinuses are clear. No evidence of bony destruction. IMPRESSION: No evidence of sinusitis. Electronically Signed   By: Marybelle Killings M.D.   On: 07/31/2016 09:35     Assessment & Plan:   Cough variant asthma  vs UACS Flare with associated bronchitis -also may be recurrent flare since stopping Symbicort and trigger control rx.  Will restart.  Spirometry shows some moderate restriction w/out obstruction . Would like to get PFT on return if DLCO is decreased can consider HRCT Chest to make sure no sign of interstitial lung changes. (chronic cough )   Plan  Patient Instructions  Restart Symbicort 2 puffs Twice daily  .  Restart Prilosec 20mg  daily before meal  Restart Pepcid 20mg  At bedtime   May use Zyrtec 10mg  At bedtime  As needed  Drainage  Zpack take as directed.  Mucinex DM Twice daily  As needed  Cough/congestion .  Chest xray today .  Follow up Dr. Melvyn Novas  In 6-8 weeks with PFT  and As needed   Please contact office for sooner follow up if symptoms do not improve or worsen or seek emergency care      Allergic rhinitis Add zyrtec At bedtime  As needed       Rexene Edison, NP 08/21/2016

## 2016-08-23 NOTE — Progress Notes (Signed)
Chart and office note reviewed in detail  > agree with a/p as outlined    

## 2016-08-26 ENCOUNTER — Telehealth: Payer: Self-pay | Admitting: Pulmonary Disease

## 2016-08-26 NOTE — Telephone Encounter (Signed)
Notes Recorded by Melvenia Needles, NP on 08/21/2016 at 12:47 PM EST No sign of PNA , cont w/ ov recs Please contact office for sooner follow up if symptoms do not improve or worsen or seek emergency car  I have called and lmom to make the pt aware of results per TP.

## 2016-09-02 ENCOUNTER — Telehealth: Payer: Self-pay | Admitting: Internal Medicine

## 2016-09-02 NOTE — Telephone Encounter (Signed)
Spoke with pt, who states his symbicort is a 500 dollar co pay. I have advised pt to contact his insurance company for a formulary. Pt aware to contact us once he has received a formulary. Will await call back.

## 2016-09-09 NOTE — Telephone Encounter (Signed)
Received a fax from [facility] stating that the symbciort PA was denied  They unfortunately do not list any covered alternatives  I wonder if pt ever received his formulary? LMTCB

## 2016-09-10 ENCOUNTER — Telehealth: Payer: Self-pay

## 2016-09-10 ENCOUNTER — Other Ambulatory Visit: Payer: Self-pay

## 2016-09-10 ENCOUNTER — Telehealth: Payer: Self-pay | Admitting: Internal Medicine

## 2016-09-10 MED ORDER — BUDESONIDE-FORMOTEROL FUMARATE 80-4.5 MCG/ACT IN AERO: 2.0000 | INHALATION_SPRAY | Freq: Two times a day (BID) | RESPIRATORY_TRACT | 0 refills | Status: DC

## 2016-09-10 NOTE — Telephone Encounter (Signed)
Alan Mulder at 09/10/2016 8:35 AM   Status: Signed    New Message  Pt voiced had xray for chest condition, found deposits around his heart and wants MD-cooper to read it through mychart if he needs to come in.  Please f/u with pt     I will forward this message to Dr Burt Knack to review CXR results in EPIC.

## 2016-09-10 NOTE — Telephone Encounter (Signed)
[phone] calling back about his symbicort

## 2016-09-10 NOTE — Telephone Encounter (Signed)
Spoke with pt. And informed pt. Of leslie message the pt. Will reach out to [facility] and find out more information about his formulary and then contact us back with the information. Nothing further is needed at this time.

## 2016-09-10 NOTE — Telephone Encounter (Signed)
Pt. Called in and stated he heard from his insurance and they stated they only cover other titer 3 inhaler, two are which are Advair and one is Breo. He stated he has another form that was given to him for an appeal that he wants to bring by the office for [place] to look and see if this could be filled out. Pt. Stated he is out of his Symbicort, samples were placed up front for him in the meantime. Will await him coming in sometime this week to bring forms

## 2016-09-11 NOTE — Telephone Encounter (Signed)
Spoke with the pt  He came by to pick up samples and dropped off appeal information  Will hold to discuss with MW this pm

## 2016-09-11 NOTE — Telephone Encounter (Signed)
I put the appeal info in MW's lookat  The letter lists alternatives to the symbicort  Please advise

## 2016-09-11 NOTE — Telephone Encounter (Signed)
Patient in the lobby wanting to speak to nurse regarding forms for an appeal - He will wait in the lobby -pr

## 2016-09-12 NOTE — Telephone Encounter (Signed)
Per MW- needs to switch to Advair HFA 45 mcg 2 puffs bid  LMTCB

## 2016-09-12 NOTE — Telephone Encounter (Signed)
Aortic arch calcification is a common finding and he has known atherosclerosis on medical therapy with ASA/statin.

## 2016-09-13 DIAGNOSIS — H353131 Nonexudative age-related macular degeneration, bilateral, early dry stage: Secondary | ICD-10-CM | POA: Diagnosis not present

## 2016-09-13 DIAGNOSIS — H52223 Regular astigmatism, bilateral: Secondary | ICD-10-CM | POA: Diagnosis not present

## 2016-09-13 DIAGNOSIS — H524 Presbyopia: Secondary | ICD-10-CM | POA: Diagnosis not present

## 2016-09-13 DIAGNOSIS — H2513 Age-related nuclear cataract, bilateral: Secondary | ICD-10-CM | POA: Diagnosis not present

## 2016-09-13 DIAGNOSIS — H43813 Vitreous degeneration, bilateral: Secondary | ICD-10-CM | POA: Diagnosis not present

## 2016-09-13 DIAGNOSIS — H04123 Dry eye syndrome of bilateral lacrimal glands: Secondary | ICD-10-CM | POA: Diagnosis not present

## 2016-09-13 NOTE — Telephone Encounter (Signed)
I spoke with the pt and made him aware of Dr Antionette Char comments. No changes recommended at this time.

## 2016-09-16 NOTE — Telephone Encounter (Signed)
lmtcb x2 for pt. 

## 2016-09-16 NOTE — Telephone Encounter (Signed)
Spoke with pt. He is aware of MW's recommendation. States that he still has samples of Symbicort and would like to use those until they are gone. Pt will call back when he needs Advair sent to the pharmacy. Nothing further was needed.

## 2016-09-16 NOTE — Telephone Encounter (Signed)
Patient returned call, CB is [phone]

## 2016-10-10 ENCOUNTER — Encounter: Payer: Self-pay | Admitting: Internal Medicine

## 2016-10-10 ENCOUNTER — Ambulatory Visit (INDEPENDENT_AMBULATORY_CARE_PROVIDER_SITE_OTHER): Payer: Medicare Other | Admitting: Internal Medicine

## 2016-10-10 VITALS — BP 144/82 | HR 70 | Temp 98.2°F | Ht 67.0 in | Wt 187.0 lb

## 2016-10-10 DIAGNOSIS — J45991 Cough variant asthma: Secondary | ICD-10-CM | POA: Diagnosis not present

## 2016-10-10 LAB — NITRIC OXIDE: Nitric Oxide: 12

## 2016-10-10 MED ORDER — PANTOPRAZOLE SODIUM 40 MG PO TBEC: 40.0000 mg | DELAYED_RELEASE_TABLET | Freq: Every day | ORAL | 2 refills | Status: DC

## 2016-10-10 MED ORDER — TRAMADOL HCL 50 MG PO TABS: ORAL_TABLET | ORAL | 0 refills | Status: DC

## 2016-10-10 MED ORDER — BUDESONIDE-FORMOTEROL FUMARATE 80-4.5 MCG/ACT IN AERO: 2.0000 | INHALATION_SPRAY | Freq: Two times a day (BID) | RESPIRATORY_TRACT | 0 refills | Status: DC

## 2016-10-10 MED ORDER — PREDNISONE 10 MG PO TABS: ORAL_TABLET | ORAL | 0 refills | Status: DC

## 2016-10-10 NOTE — Patient Instructions (Addendum)
The key to effective treatment for your cough is eliminating the non-stop cycle of cough you're stuck in long enough to let your airway heal completely and then see if there is anything still making you cough once you stop the cough suppression, but this should take no more than 5 days to figure out  First take delsym two tsp every 12 hours and supplement if needed with  tramadol 50 mg up to 2 every 4 hours to suppress the urge to cough at all or even clear your throat. Swallowing water or using ice chips/non mint and menthol containing candies (such as lifesavers or sugarless jolly ranchers) are also effective.  You should rest your voice and avoid activities that you know make you cough.  Once you have eliminated the cough for 3 straight days try reducing the tramadol first,  then the delsym as tolerated.    Prednisone 10 mg take  4 each am x 2 days,   2 each am x 2 days,  1 each am x 2 days and stop (this is to eliminate allergies and inflammation from coughing)  Protonix (pantoprazole) Take 30-60 min before first meal of the day and Pepcid 20 mg one bedtime plus chlorpheniramine 4 mg x 2 at bedtime (both available over the counter)  until cough is completely gone for at least a week without the need for cough suppression  For drainage / throat tickle try take CHLORPHENIRAMINE  4 mg - take one every 4 hours as needed - available over the counter- may cause drowsiness so start with just a bedtime dose or two and see how you tolerate it before trying in daytime    GERD (REFLUX)  is an extremely common cause of respiratory symptoms, many times with no significant heartburn at all.    It can be treated with medication, but also with lifestyle changes including avoidance of late meals, excessive alcohol, smoking cessation, and avoid fatty foods, chocolate, peppermint, colas, red wine, and acidic juices such as orange juice.  NO MINT OR MENTHOL PRODUCTS SO NO COUGH DROPS   USE HARD CANDY INSTEAD (jolley  ranchers or Stover's or Lifesavers (all available in sugarless versions) NO OIL BASED VITAMINS - use powdered substitutes.  Stop prilosec/ mucinex   Please remember to go to the lab  department downstairs in the basement  for your tests - we will call you with the results when they are available.     Please schedule a follow up office visit in 2 weeks, sooner if needed  with all medications /inhalers/ solutions in hand so we can verify exactly what you are taking. This includes all medications from all doctors and over the counters Add did not got to lab as rec

## 2016-10-10 NOTE — Progress Notes (Signed)
Brief patient profile:  9 yowm never smoker with new onset variable  cough since Oct 2016 comes and goes s background of asthma but with background of nasal allergies for which took shots and moved residence in 1980s  >  No further problems except nasal drip /[place] then fine [place] going there.    History of Present Illness  Congestion in chest x Oct 2016 worse in evenings with mostly dry coughing and much worse x sev weeks > Sood eval on 07/15/16 cough for a week/ albterol and pred no better  rec Advair one puff twice per day until sample completed >> call if you feel you need a refill after this Zithromax 250 mg pill >> 2 pills on day 1, then 1 pill daily for next four days Albuterol 2 puffs every 4 to 6 hours as needed for cough, wheeze, or chest congestion Finish course of prednisone    07/22/2016 acute extended ov/Charlot Gouin re:  Refractory cough > sob on advair and pred taper and getting worse Chief Complaint  Patient presents with  . Acute Visit    Increased SOB and cough are not improving since acute ov here 07/15/16. His cough has been occ prod with light colored sputum.  He states that his breathing does okay in the am's and then gets worse in the evenings.   on advair dpi/ benzoate and took pred 10 mg one tablet  This am and cough seems ok when he wakes up and progressively worse with Minimal actual sputum production as the day goes on and then does not really bother him while sleeping.. Shaking on details of care/ names of meds/ no better with saba  rec Take delsym two tsp every 12 hours and supplement if needed with  tramadol 50 mg up to 1 every 4 hours  .   Symbicort 80 Take 2 puffs first thing in am and then another 2 puffs about 12 hours later - if helping refill it Only use your albuterol (ventolin) inhaler  As long as coughing at all:  Try prilosec otc 20mg   Take 30-60 min before first meal of the day and Pepcid ac (famotidine) 20 mg  one @  bedtime until cough is completely gone for at least a week without the need for cough suppression GERD diet    07/30/2016 acute extended ov/Trinita Devlin re: refractory cough since late Nov 2017  Chief Complaint  Patient presents with  . Acute Visit    Pt c/o increased cough and congestion for the past 2 wks. He states symptoms get worse in the evening. Cough is prod in the am's with thick, clear sputum.  He states he can not lie down flat due to cough and cough sometimes waked him up. He also c/o wheezing.   brought all his meds, not using symb 2bid per count, only missing 11 tramadol "it's for pain, right? "  Some better while on pred, worse since finished it/ only taking prilosec otc 20 mg qam / is taking pepcid 20 mg hs  Not using any saba "it's for emergencies, right?"  rec Prednisone 10 mg take  4 each am x 2 days,   2 each am x 2 days,  1 each am x 2 days and stop  Increase the prilosec ac to where you take x 2 30-60 mn before breakfast  And continue the pepcid ac 20mg  in evening  Continue symbicort 80 (same as dulera 100 sample) Take  2 puffs first thing in am and then another 2 puffs about 12 hours later.  Only use your albuterol as a rescue medication  For cough use mucinex dm 1200 mg every 12 hours and supplement with the tramadol up to 2 every 4 hours until no cough x 3 days  Please see patient coordinator before you leave today  to schedule sinus CT    08/21/16  Restart Symbicort 2 puffs Twice daily  .  Restart Prilosec 20mg  daily before meal  Restart Pepcid 20mg  At bedtime   May use Zyrtec 10mg  At bedtime  As needed  Drainage  Zpack take as directed.  Mucinex DM Twice daily  As needed  Cough/congestion .     10/10/2016 acute extended ov/Sana Tessmer re:  uacs vs asthma  maint on symb 80 2bid / gerd rx with otcs only  Chief Complaint  Patient presents with  . Acute Visit    Pt c/o cough and increased SOB x 1 wk, and progressively worse over the past 3-4 days. His cough is prod with  thick, clear sputum. He states can not lie down flat without cough.   sensation of something stuck never gone away since = oct 2016 then acutely worse sob/ cough x 1 week prior to OV  And not better p saba Does not have mucinex dm / no zyrtec    No obvious day to day or daytime variability or assoc   purulent sputum or mucus plugs or hemoptysis or cp or chest tightness, subjective wheeze or overt sinus or hb symptoms. No unusual exp hx or h/o childhood pna/ asthma or knowledge of premature birth.   Also denies any obvious fluctuation of symptoms with weather or environmental changes or other aggravating or alleviating factors except as outlined above   Current Medications, Allergies, Complete Past Medical History, Past Surgical History, Family History, and Social History were reviewed in [employer] record.  ROS  The following are not active complaints unless bolded sore throat, dysphagia, dental problems, itching, sneezing,  nasal congestion or excess/ purulent secretions, ear ache,   fever, chills, sweats, unintended wt loss, classically pleuritic or exertional cp,  orthopnea pnd or leg swelling, presyncope, palpitations, abdominal pain, anorexia, nausea, vomiting, diarrhea  or change in bowel or bladder habits, change in stools or urine, dysuria,hematuria,  rash, arthralgias, visual complaints, headache, numbness, weakness or ataxia or problems with walking or coordination,  change in mood/affect or memory.         Pulmonary tests CT angio chest 11/30/15 >> motion artifact, mosaic pattern, calcified granuloma SNIFF test 11/30/15 >> elevated Rt diaphragm, but has b/l diaphragm movement  Cardiac tests 10/16/15 Doppler legs b/l >> no DVT 11/30/15 Echo >> EF 65 to [id number], grade 1 diastolic CHF  Sleep tests HST 12/21/15 >> AHI 44.5, SpO2 low 64% Auto CPAP 03/02/16 to 03/31/16 >> used on 26 of 30 nights with average 6 hrs 32 min.  Average AHI 5.5 with median CPAP 8 and 95 th  percentile CPAP 11 cm H2O ONO with CPAP 04/10/16 >> test time 6 hrs 30 min.  Mean SpO2 93.51%, low SpO2 77%.  Spent 7 min with SpO2 < 88%   Past medical history Ankylosing spondylitis, Allergies, CAD, HLD, HTN, Nephrolithiasis, Malaria      Physical Exam  amb obese wm nad with audible pseudowheezing /honking quality  cough   10/10/2016         187  07/30/2016     191  07/22/16  194 lb (88 kg)  07/15/16 196 lb (88.9 kg)  07/12/16 192 lb 9.6 oz (87.4 kg)    Vital signs reviewed    - Note on arrival 02 sats  96% on RA     General - No distress ENT - No sinus tenderness, clear nasal discharge, no oral exudate, no LAN Cardiac - s1s2 regular, no murmur Chest -  Lungs are fairly clear with marked pseudowheeze better with plm  Back - No focal tenderness Abd - Soft, non-tender Ext - No edema Neuro - Normal strength Skin - No rashes Psych - normal mood, and behavior    I personally reviewed images and agree with radiology impression as follows:  CXR:  08/21/16 Chronic right hemidiaphragm elevation. No acute cardiopulmonary abnormality.  Labs ordered 10/10/2016  :  Allergy profile/ eos: did not go to lab    Assessment/Plan

## 2016-10-11 DIAGNOSIS — L570 Actinic keratosis: Secondary | ICD-10-CM | POA: Diagnosis not present

## 2016-10-13 NOTE — Assessment & Plan Note (Addendum)
07/22/2016    > try reduce symbicort to 80 2bid  - FENO 07/15/2016  =   42 on sym 160 2bid  - 07/30/2016  After extensive coaching HFA effectiveness =    90% > continue symb 80 2bid  - Sinus CT 07/31/2016 >>> neg - FENO 10/10/2016  =   12 during flare while on symb 80 2bid   Based on exam and low feno>>  strongly favor on this episode rather than asthma :  Upper airway cough syndrome (previously labeled PNDS) , is  so named because it's frequently impossible to sort out how much is  CR/sinusitis with freq throat clearing (which can be related to primary GERD)   vs  causing  secondary (" extra esophageal")  GERD from wide swings in gastric pressure that occur with throat clearing, often  promoting self use of mint and menthol lozenges that reduce the lower esophageal sphincter tone and exacerbate the problem further in a cyclical fashion.   These are the same pts (now being labeled as having "irritable larynx syndrome" by some cough centers) who not infrequently have a history of having failed to tolerate ace inhibitors,  dry powder inhalers or biphosphonates or report having atypical/extraesophageal reflux symptoms that don't respond to standard doses of PPI  and are easily confused as having aecopd or asthma flares by even experienced allergists/ pulmonologists (myself included).  Of the three most common causes of  Sub-acute or recurrent or chronic cough, only one (GERD)  can actually contribute to/ trigger  the other two (asthma and post nasal drip syndrome)  and perpetuate the cylce of cough.  While not intuitively obvious, many patients with chronic low grade reflux do not cough until there is a primary insult that disturbs the protective epithelial barrier and exposes sensitive nerve endings.   This is typically viral but can be direct physical injury such as with an endotracheal tube.   The point is that once this occurs, it is difficult to eliminate the cycle  using anything but a maximally  effective acid suppression regimen at least in the short run, accompanied by an appropriate diet to address non acid GERD and control / eliminate the cough itself for at least 3 days.   rec elimination of cyclical cough as a priority and eval with allergy profile > did not go to lab  Return  with all meds in hand using a trust but verify approach to confirm accurate Medication  Reconciliation The principal here is that until we are certain that the  patients are doing what we've asked, it makes no sense to ask them to do more.

## 2016-10-17 ENCOUNTER — Ambulatory Visit (INDEPENDENT_AMBULATORY_CARE_PROVIDER_SITE_OTHER): Payer: Medicare Other | Admitting: Internal Medicine

## 2016-10-17 ENCOUNTER — Other Ambulatory Visit (INDEPENDENT_AMBULATORY_CARE_PROVIDER_SITE_OTHER): Payer: Medicare Other

## 2016-10-17 ENCOUNTER — Encounter: Payer: Self-pay | Admitting: Internal Medicine

## 2016-10-17 VITALS — BP 120/60 | HR 77 | Ht 66.5 in | Wt 183.0 lb

## 2016-10-17 DIAGNOSIS — J45991 Cough variant asthma: Secondary | ICD-10-CM

## 2016-10-17 DIAGNOSIS — R0602 Shortness of breath: Secondary | ICD-10-CM

## 2016-10-17 LAB — CBC WITH DIFFERENTIAL/PLATELET
Basophils Absolute: 0.1 10*3/uL (ref 0.0–0.1)
Basophils Relative: 0.9 % (ref 0.0–3.0)
EOS PCT: 0.3 % (ref 0.0–5.0)
Eosinophils Absolute: 0 10*3/uL (ref 0.0–0.7)
HEMATOCRIT: 45.9 % (ref 39.0–52.0)
HEMOGLOBIN: 15.7 g/dL (ref 13.0–17.0)
LYMPHS PCT: 11.1 % — AB (ref 12.0–46.0)
Lymphs Abs: 0.8 10*3/uL (ref 0.7–4.0)
MCHC: 34.2 g/dL (ref 30.0–36.0)
MCV: 89.8 fl (ref 78.0–100.0)
MONO ABS: 0.6 10*3/uL (ref 0.1–1.0)
MONOS PCT: 8.2 % (ref 3.0–12.0)
Neutro Abs: 5.4 10*3/uL (ref 1.4–7.7)
Neutrophils Relative %: 79.5 % — ABNORMAL HIGH (ref 43.0–77.0)
Platelets: 121 10*3/uL — ABNORMAL LOW (ref 150.0–400.0)
RBC: 5.12 Mil/uL (ref 4.22–5.81)
RDW: 15.3 % (ref 11.5–15.5)
WBC: 6.8 10*3/uL (ref 4.0–10.5)

## 2016-10-17 LAB — PULMONARY FUNCTION TEST
DL/VA % pred: 90 %
DL/VA: 3.95 ml/min/mmHg/L
DLCO COR: 16.07 ml/min/mmHg
DLCO cor % pred: 58 %
DLCO unc % pred: 60 %
DLCO unc: 16.71 ml/min/mmHg
FEF 25-75 Post: 2.83 L/sec
FEF 25-75 Pre: 3.14 L/sec
FEF2575-%Change-Post: -10 %
FEF2575-%PRED-PRE: 157 %
FEF2575-%Pred-Post: 141 %
FEV1-%Change-Post: 0 %
FEV1-%PRED-PRE: 89 %
FEV1-%Pred-Post: 89 %
FEV1-POST: 2.4 L
FEV1-Pre: 2.39 L
FEV1FVC-%CHANGE-POST: 0 %
FEV1FVC-%Pred-Pre: 120 %
FEV6-%CHANGE-POST: 0 %
FEV6-%PRED-PRE: 78 %
FEV6-%Pred-Post: 79 %
FEV6-POST: 2.76 L
FEV6-PRE: 2.73 L
FEV6FVC-%PRED-POST: 106 %
FEV6FVC-%PRED-PRE: 106 %
FVC-%CHANGE-POST: 0 %
FVC-%PRED-POST: 74 %
FVC-%Pred-Pre: 73 %
FVC-Post: 2.76 L
FVC-Pre: 2.73 L
Post FEV1/FVC ratio: 87 %
Post FEV6/FVC ratio: 100 %
Pre FEV1/FVC ratio: 88 %
Pre FEV6/FVC Ratio: 100 %
RV % pred: 59 %
RV: 1.39 L
TLC % PRED: 69 %
TLC: 4.37 L

## 2016-10-17 MED ORDER — MONTELUKAST SODIUM 10 MG PO TABS: 10.0000 mg | ORAL_TABLET | Freq: Every day | ORAL | 11 refills | Status: DC

## 2016-10-17 NOTE — Patient Instructions (Addendum)
Add singulair each evening 10 daily trial basis   Please remember to go to the lab  department downstairs in the basement  for your tests - we will call you with the results when they are available.  See Tammy NP in 4  weeks with all your medications, even over the counter meds, separated in two separate bags, the ones you take no matter what vs the ones you stop once you feel better and take only as needed when you feel you need them.   Tammy  will generate for you a new user friendly medication calendar that will put [place] all on the same page re: your medication use.     Without this process, it simply isn't possible to assure that we are providing  your outpatient care  with  the attention to detail we feel you deserve.   If we cannot assure that you're getting that kind of care,  then we cannot manage your problem effectively from this clinic.  Once you have seen Tammy and we are sure that we're all on the same page with your medication use she will arrange follow up with me or with allergy depending on your tests  Add : consider adding flutter if doesn't have one and HRCT looking for bronchiectasis

## 2016-10-17 NOTE — Progress Notes (Signed)
PFT done today. 

## 2016-10-17 NOTE — Progress Notes (Signed)
Brief patient profile:  9 yowm never smoker with new onset variable  cough since Oct 2016 comes and goes s background of asthma but with background of nasal allergies for which took shots and moved residence in 1980s  >  No further problems except nasal drip /[place] then fine when stopped going there.    History of Present Illness  Congestion in chest x Oct 2016 worse in evenings with mostly dry coughing and much worse x sev weeks > Sood eval on 07/15/16 cough for a week/ albterol and pred no better  rec Advair one puff twice per day until sample completed >> call if you feel you need a refill after this Zithromax 250 mg pill >> 2 pills on day 1, then 1 pill daily for next four days Albuterol 2 puffs every 4 to 6 hours as needed for cough, wheeze, or chest congestion Finish course of prednisone    07/22/2016 acute extended ov/Lizandra Zakrzewski re:  Refractory cough > sob on advair and pred taper and getting worse Chief Complaint  Patient presents with  . Acute Visit    Increased SOB and cough are not improving since acute ov here 07/15/16. His cough has been occ prod with light colored sputum.  He states that his breathing does okay in the am's and then gets worse in the evenings.   on advair dpi/ benzoate and took pred 10 mg one tablet  This am and cough seems ok when he wakes up and progressively worse with Minimal actual sputum production as the day goes on and then does not really bother him while sleeping.. Shaking on details of care/ names of meds/ no better with saba  rec Take delsym two tsp every 12 hours and supplement if needed with  tramadol 50 mg up to 1 every 4 hours  .   Symbicort 80 Take 2 puffs first thing in am and then another 2 puffs about 12 hours later - if helping refill it Only use your albuterol (ventolin) inhaler  As long as coughing at all:  Try prilosec otc 20mg   Take 30-60 min before first meal of the day and Pepcid ac (famotidine) 20 mg  one @  bedtime until cough is completely gone for at least a week without the need for cough suppression GERD diet    07/30/2016 acute extended ov/Tommi Crepeau re: refractory cough since late Nov 2017  Chief Complaint  Patient presents with  . Acute Visit    Pt c/o increased cough and congestion for the past 2 wks. He states symptoms get worse in the evening. Cough is prod in the am's with thick, clear sputum.  He states he can not lie down flat due to cough and cough sometimes waked him up. He also c/o wheezing.   brought all his meds, not using symb 2bid per count, only missing 11 tramadol "it's for pain, right? "  Some better while on pred, worse since finished it/ only taking prilosec otc 20 mg qam / is taking pepcid 20 mg hs  Not using any saba "it's for emergencies, right?"  rec Prednisone 10 mg take  4 each am x 2 days,   2 each am x 2 days,  1 each am x 2 days and stop  Increase the prilosec ac to where you take x 2 30-60 mn before breakfast  And continue the pepcid ac 20mg  in evening  Continue symbicort 80 (same as dulera 100 sample) Take  2 puffs first thing in am and then another 2 puffs about 12 hours later.  Only use your albuterol as a rescue medication  For cough use mucinex dm 1200 mg every 12 hours and supplement with the tramadol up to 2 every 4 hours until no cough x 3 days  Please see patient coordinator before you leave today  to schedule sinus CT    08/21/16  Restart Symbicort 2 puffs Twice daily  .  Restart Prilosec 20mg  daily before meal  Restart Pepcid 20mg  At bedtime   May use Zyrtec 10mg  At bedtime  As needed  Drainage  Zpack take as directed.  Mucinex DM Twice daily  As needed  Cough/congestion .     10/10/2016 acute extended ov/Nelta Caudill re:  uacs vs asthma  maint on symb 80 2bid / gerd rx with otcs only  Chief Complaint  Patient presents with  . Acute Visit    Pt c/o cough and increased SOB x 1 wk, and progressively worse over the past 3-4 days. His cough is prod with  thick, clear sputum. He states can not lie down flat without cough.   sensation of something stuck never gone away since = oct 2016 then acutely worse sob/ cough x 1 week prior to OV  And not better p saba Does not have mucinex dm / no zyrtec  rec First take delsym two tsp every 12 hours and supplement if needed with  tramadol 50 mg up to 2 every 4 hours to suppress the urge to cough at all or even clear your throat. Swallowing water or using ice chips/non mint and menthol containing candies (such as lifesavers or sugarless jolly ranchers) are also effective.  You should rest your voice and avoid activities that you know make you cough. Once you have eliminated the cough for 3 straight days try reducing the tramadol first,  then the delsym as tolerated.   Prednisone 10 mg take  4 each am x 2 days,   2 each am x 2 days,  1 each am x 2 days and stop (this is to eliminate allergies and inflammation from coughing) Protonix (pantoprazole) Take 30-60 min before first meal of the day and Pepcid 20 mg one bedtime plus chlorpheniramine 4 mg x 2 at bedtime (both available over the counter)  until cough is completely gone for at least a week without the need for cough suppression For drainage / throat tickle try take CHLORPHENIRAMINE  4 mg - take one every 4 hours as needed - available over the counter- may cause drowsiness so start with just a bedtime dose or two and see how you tolerate it before trying in daytime   GERD diet  Stop prilosec/ mucinex  Please remember to go to the lab  department downstairs in the basement  for your tests - we will call you with the results when they are available.  Please schedule a follow up office visit in 2 weeks, sooner if needed  with all medications /inhalers/ solutions in hand so we can verify exactly what you are taking. This includes all medications from all doctors and over the counters Add did not got to lab as rec    10/17/2016  f/u ov/Kimberlin Scheel re: intermittent cough x  oct 2016 def gets better on prednisone / sense of chest congestion but no excess mucus Chief Complaint  Patient presents with  . Follow-up    PFT's done today.  symptoms of daily cough  worse p supper  Did not bring meds as requested / not clear he even read them when reading them back line by line  Not using any albuterol "doesn't help"  Not able to lie down due to cough x month x no better even while on prednisone though "80% better" on pred vs off it/ no longer using cpap   No obvious day to day or daytime variability or assoc  purulent sputum or mucus plugs or hemoptysis or cp or chest tightness, subjective wheeze or overt sinus or hb symptoms. No unusual exp hx or h/o childhood pna/ asthma or knowledge of premature birth.   Also denies any obvious fluctuation of symptoms with weather or environmental changes or other aggravating or alleviating factors except as outlined above   Current Medications, Allergies, Complete Past Medical History, Past Surgical History, Family History, and Social History were reviewed in [employer] record.  ROS  The following are not active complaints unless bolded sore throat, dysphagia, dental problems, itching, sneezing,  nasal congestion or excess/ purulent secretions, ear ache,   fever, chills, sweats, unintended wt loss, classically pleuritic or exertional cp,  orthopnea pnd or leg swelling, presyncope, palpitations, abdominal pain, anorexia, nausea, vomiting, diarrhea  or change in bowel or bladder habits, change in stools or urine, dysuria,hematuria,  rash, arthralgias, visual complaints, headache, numbness, weakness or ataxia or problems with walking or coordination,  change in mood/affect or memory.         Pulmonary tests CT angio chest 11/30/15 >> motion artifact, mosaic pattern, calcified granuloma SNIFF test 11/30/15 >> elevated Rt diaphragm, but has b/l diaphragm movement  Cardiac tests 10/16/15 Doppler legs b/l >> no  DVT 11/30/15 Echo >> EF 65 to 69%, grade 1 diastolic CHF  Sleep tests HST 12/21/15 >> AHI 44.5, SpO2 low 64% Auto CPAP 03/02/16 to 03/31/16 >> used on 26 of 30 nights with average 6 hrs 32 min.  Average AHI 5.5 with median CPAP 8 and 95 th percentile CPAP 11 cm H2O ONO with CPAP 04/10/16 >> test time 6 hrs 30 min.  Mean SpO2 93.51%, low SpO2 77%.  Spent 7 min with SpO2 < 88%   Past medical history Ankylosing spondylitis, Allergies, CAD, HLD, HTN, Nephrolithiasis, Malaria      Physical Exam  amb obese wm nad with audible pseudowheezing/ hopeless affect    10/17/2016         183  10/10/2016         187  07/30/2016     191  07/22/16 194 lb (88 kg)  07/15/16 196 lb (88.9 kg)  07/12/16 192 lb 9.6 oz (87.4 kg)    Vital signs reviewed    - Note on arrival 02 sats  96% on RA     General - No distress ENT - No sinus tenderness, clear nasal discharge, no oral exudate, no LAN Cardiac - s1s2 regular, no murmur Chest -  Lungs are fairly clear with prominent pseudowheeze better with plm  Back - No focal tenderness Abd - Soft, non-tender Ext - No edema Neuro - Normal strength Skin - No rashes Psych - normal mood, and behavior    I personally reviewed images and agree with radiology impression as follows:  CXR:  08/21/16 Chronic right hemidiaphragm elevation. No acute cardiopulmonary abnormality.      Assessment/Plan

## 2016-10-18 LAB — RESPIRATORY ALLERGY PROFILE REGION II ~~LOC~~
Allergen, C. Herbarum, M2: <0.1 kU/L
Allergen, Comm Silver Birch, t9: <0.1 kU/L
Allergen, Cottonwood, t14: <0.1 kU/L
Allergen, D pternoyssinus,d7: 0.52 kU/L — ABNORMAL HIGH
Allergen, Mulberry, t76: <0.1 kU/L
Bermuda Grass: <0.1 kU/L
Cockroach: <0.1 kU/L
D. farinae: 0.82 kU/L — ABNORMAL HIGH
Dog Dander: <0.1 kU/L
Elm IgE: <0.1 kU/L
IgE (Immunoglobulin E), Serum: 55 kU/L (ref <115)
Pecan/Hickory Tree IgE: <0.1 kU/L
Timothy Grass: <0.1 kU/L

## 2016-10-18 NOTE — Assessment & Plan Note (Signed)
07/22/2016    > try reduce symbicort to 80 2bid  - FENO 07/15/2016  =   42 on sym 160 2bid  - 07/30/2016  After extensive coaching HFA effectiveness =    90% > continue symb 80 2bid  - Sinus CT 07/31/2016 >>> neg - FENO 10/10/2016  =   12 during flare while on symb 80 2bid - PFT's  10/17/2016  FEV1 2.40 (89 % ) ratio 87  p 0 % improvement from saba p symb 80 x 2 prior to study with DLCO  60/58 % corrects to 90 % for alv volume and erv =50%   - Allergy profile 10/17/2016 >  Eos 0.0 /  IgE   - 10/17/2016  Added singulair 10 mg   Symptoms are markedly disproportionate to objective findings and not clear this is a lung problem but pt does appear to have difficult airway management issues. DDX of  difficult airways management almost all start with A and  include Adherence, Ace Inhibitors, Acid Reflux, Active Sinus Disease, Alpha 1 Antitripsin deficiency, Anxiety masquerading as Airways dz,  ABPA,  Allergy(esp in young), Aspiration (esp in elderly), Adverse effects of meds,  Active smokers, A bunch of PE's (a small clot burden can't cause this syndrome unless there is already severe underlying pulm or vascular dz with poor reserve) plus two Bs  = Bronchiectasis and Beta blocker use..and one C= CHF   Adherence is always the initial "prime suspect" and is a multilayered concern that requires a "trust but verify" approach in every patient - starting with knowing how to use medications, especially inhalers, correctly, keeping up with refills and understanding the fundamental difference between maintenance and prns vs those medications only taken for a very short course and then stopped and not refilled.  - needs to return with all meds in hand using a trust but verify approach to confirm accurate Medication  Reconciliation The principal here is that until we are certain that the  patients are doing what we've asked, it makes no sense to ask them to do more.   ? Acid (or non-acid) GERD > always difficult to exclude as  up to 75% of pts in some series report no assoc GI/ Heartburn symptoms> rec continue max (24h)  acid suppression and diet restrictions/ reviewed     ? Allergy > add singulair pending allergy profile but the low feno on rx suggest that this component is controlled with symb 80 2 bid   ? Anxiety / depression > may be interfering in adherence as has not yet followed instructions given re how to take meds correctly  ? Bronchiectasis > may need HRCT next ov if not improving   I had an extended discussion with the patient reviewing all relevant studies completed to date and  lasting 25 minutes of a 40  minute office visit addressing refractory  non-specific but potentially very serious respiratory symptoms of unknown etiology.  Each maintenance medication was reviewed in detail including most importantly the difference between maintenance and prns and under what circumstances the prns are to be triggered using an action plan format that is not reflected in the computer generated alphabetically organized AVS.    Please see AVS for specific instructions unique to this office visit that I personally wrote and verbalized to the the pt in detail and then reviewed with pt  by my nurse highlighting any changes in therapy/plan of care  recommended at today's visit.

## 2016-10-18 NOTE — Assessment & Plan Note (Addendum)
Body mass index is 29.09 kg/m.  trending down and now below 30 / encouraged Lab Results  Component Value Date   TSH 3.16 60/67/7034     Complicated by osa an contributing to gerd risk/ doe/reviewed the need and the process to achieve and maintain neg calorie balance > defer f/u primary care including intermittently monitoring thyroid status

## 2016-10-21 ENCOUNTER — Telehealth: Payer: Self-pay | Admitting: Internal Medicine

## 2016-10-21 ENCOUNTER — Ambulatory Visit (HOSPITAL_BASED_OUTPATIENT_CLINIC_OR_DEPARTMENT_OTHER): Payer: Medicare Other | Admitting: Internal Medicine

## 2016-10-21 ENCOUNTER — Encounter: Payer: Self-pay | Admitting: Medical Oncology

## 2016-10-21 ENCOUNTER — Other Ambulatory Visit (HOSPITAL_BASED_OUTPATIENT_CLINIC_OR_DEPARTMENT_OTHER): Payer: Medicare Other

## 2016-10-21 VITALS — BP 108/76 | HR 69 | Temp 98.5°F | Wt 179.9 lb

## 2016-10-21 DIAGNOSIS — R42 Dizziness and giddiness: Secondary | ICD-10-CM | POA: Insufficient documentation

## 2016-10-21 DIAGNOSIS — D693 Immune thrombocytopenic purpura: Secondary | ICD-10-CM

## 2016-10-21 DIAGNOSIS — J209 Acute bronchitis, unspecified: Secondary | ICD-10-CM

## 2016-10-21 DIAGNOSIS — D696 Thrombocytopenia, unspecified: Secondary | ICD-10-CM

## 2016-10-21 DIAGNOSIS — R05 Cough: Secondary | ICD-10-CM | POA: Diagnosis not present

## 2016-10-21 HISTORY — DX: Dizziness and giddiness: R42

## 2016-10-21 LAB — CBC WITH DIFFERENTIAL/PLATELET
BASO%: 0 % (ref 0.0–2.0)
Basophils Absolute: 0 10*3/uL (ref 0.0–0.1)
EOS%: 0.6 % (ref 0.0–7.0)
Eosinophils Absolute: 0 10*3/uL (ref 0.0–0.5)
HCT: 49.3 % (ref 38.4–49.9)
HGB: 16.5 g/dL (ref 13.0–17.1)
LYMPH%: 17 % (ref 14.0–49.0)
MCH: 30.3 pg (ref 27.2–33.4)
MCHC: 33.4 g/dL (ref 32.0–36.0)
MCV: 90.7 fL (ref 79.3–98.0)
MONO#: 1.3 10*3/uL — ABNORMAL HIGH (ref 0.1–0.9)
MONO%: 15.5 % — ABNORMAL HIGH (ref 0.0–14.0)
NEUT#: 5.7 10*3/uL (ref 1.5–6.5)
NEUT%: 66.9 % (ref 39.0–75.0)
Platelets: 101 10*3/uL — ABNORMAL LOW (ref 140–400)
RBC: 5.44 10*6/uL (ref 4.20–5.82)
RDW: 15.3 % — ABNORMAL HIGH (ref 11.0–14.6)
WBC: 8.5 10*3/uL (ref 4.0–10.3)
lymph#: 1.5 10*3/uL (ref 0.9–3.3)

## 2016-10-21 LAB — COMPREHENSIVE METABOLIC PANEL
ALBUMIN: 4.2 g/dL (ref 3.5–5.0)
ALK PHOS: 78 U/L (ref 40–150)
ALT: 19 U/L (ref 0–55)
AST: 13 U/L (ref 5–34)
Anion Gap: 10 mEq/L (ref 3–11)
BILIRUBIN TOTAL: 1.08 mg/dL (ref 0.20–1.20)
BUN: 24 mg/dL (ref 7.0–26.0)
CALCIUM: 9.7 mg/dL (ref 8.4–10.4)
CO2: 26 mEq/L (ref 22–29)
Chloride: 105 mEq/L (ref 98–109)
Creatinine: 1.1 mg/dL (ref 0.7–1.3)
EGFR: 68 mL/min/{1.73_m2} — AB (ref >90)
GLUCOSE: 139 mg/dL (ref 70–140)
Potassium: 4.2 mEq/L (ref 3.5–5.1)
SODIUM: 141 meq/L (ref 136–145)
TOTAL PROTEIN: 7.3 g/dL (ref 6.4–8.3)

## 2016-10-21 LAB — TECHNOLOGIST REVIEW

## 2016-10-21 LAB — LACTATE DEHYDROGENASE: LDH: 97 U/L — AB (ref 125–245)

## 2016-10-21 NOTE — Telephone Encounter (Signed)
Gave patient avs report and appointments for September. Central radiology will call re scan.  °

## 2016-10-21 NOTE — Progress Notes (Signed)
[facility] [phone]   [phone]  OFFICE PROGRESS NOTE  Adams-Doolittle,Benjamin R, MD [place] [address]  DIAGNOSIS: Thrombocytopenia, most likely idiopathic thrombocytopenic purpura.  PRIOR THERAPY:None.  CURRENT THERAPY: observation.  INTERVAL HISTORY: [name] 76 y.o. male came to the clinic today for follow-up visit. The patient is not feeling well today with persistent cough and chest congestion over the last few weeks. He had called a few weeks ago and have not recovered completely. He continues to have dizzy spells and he lost around 9 pounds since his last visit. He denied having any significant fever or chills. He has no chest pain or hemoptysis. He denied having any nausea, vomiting, diarrhea or constipation. He is today for reevaluation with repeat CBC.  MEDICAL HISTORY: Past Medical History:  Diagnosis Date  . Abnormal chest x-ray   . Allergic rhinitis   . Ankylosing spondylitis ([place])   . Anxiety   . Arthritis   . Colonic polyp   . Coronary atherosclerosis of native coronary artery   . Cough   . Ganglion cyst of wrist    left  . Heart attack 2008  . Hemorrhoids   . History of malaria   . Hyperlipidemia    with low HDL  . Hypertension   . Kidney stone   . Other disorder of muscle, ligament, and fascia   . Rotator cuff tear   . Swelling of lower extremity 10/16/2015    ALLERGIES:  has No Known Allergies.  MEDICATIONS:  Current Outpatient Prescriptions  Medication Sig Dispense Refill  . albuterol (PROVENTIL HFA;VENTOLIN HFA) 108 (90 Base) MCG/ACT inhaler Inhale 2 puffs into the lungs every 4 (four) hours as needed for wheezing or shortness of breath (cough, shortness of breath or wheezing.). 1 Inhaler 3  . amLODipine (NORVASC) 2.5 MG tablet TAKE 1 TABLET EVERY DAY 90 tablet 3  . aspirin 81 MG EC tablet Take 81 mg by mouth at bedtime.     . budesonide-formoterol (SYMBICORT) 80-4.5 MCG/ACT  inhaler Inhale 2 puffs into the lungs 2 (two) times daily. 1 Inhaler 0  . dextromethorphan (DELSYM) 30 MG/5ML liquid as needed for cough.    . famotidine (PEPCID) 20 MG tablet One at bedtime    . montelukast (SINGULAIR) 10 MG tablet Take 1 tablet (10 mg total) by mouth at bedtime. 30 tablet 11  . pantoprazole (PROTONIX) 40 MG tablet Take 1 tablet (40 mg total) by mouth daily. Take 30-60 min before first meal of the day 30 tablet 2  . simvastatin (ZOCOR) 20 MG tablet TAKE 1 TABLET EVERY DAY WITH BREAKFAST 90 tablet 2  . traMADol (ULTRAM) 50 MG tablet 1-2 every 4 hours as needed for cough or pain 40 tablet 0   No current facility-administered medications for this visit.     SURGICAL HISTORY:  Past Surgical History:  Procedure Laterality Date  . HEMORRHOID SURGERY  10/10   in [place]  . LEFT HEART CATHETERIZATION WITH CORONARY ANGIOGRAM N/A 09/03/2012   Procedure: LEFT HEART CATHETERIZATION WITH CORONARY ANGIOGRAM;  Surgeon: [name], MD;  Location: [facility] CATH LAB;  Service: Cardiovascular;  Laterality: N/A;  . ROTATOR CUFF REPAIR     bilat by Dr. French Ana    REVIEW OF SYSTEMS:  Constitutional: negative Eyes: negative Ears, nose, mouth, throat, and face: negative Respiratory: positive for cough and dyspnea on exertion Cardiovascular: negative Gastrointestinal: negative Genitourinary:negative Integument/breast: negative Hematologic/lymphatic: negative Musculoskeletal:negative Neurological: positive for dizziness Behavioral/Psych: negative  Endocrine: negative Allergic/Immunologic: negative   PHYSICAL EXAMINATION: General appearance: alert, cooperative, fatigued and no distress Head: Normocephalic, without obvious abnormality, atraumatic Neck: no adenopathy Lymph nodes: Cervical, supraclavicular, and axillary nodes normal. Resp: clear to auscultation bilaterally Back: symmetric, no curvature. ROM normal. No CVA tenderness. Cardio: regular rate and rhythm, S1, S2 normal, no murmur,  click, rub or gallop GI: soft, non-tender; bowel sounds normal; no masses,  no organomegaly Extremities: extremities normal, atraumatic, no cyanosis or edema Neurologic: Alert and oriented X 3, normal strength and tone. Normal symmetric reflexes. Normal coordination and gait  ECOG PERFORMANCE STATUS: 1 - Symptomatic but completely ambulatory  Blood pressure 108/76, pulse 69, temperature 98.5 F (36.9 C), temperature source Oral, weight 179 lb 14.4 oz (81.6 kg), SpO2 96 %.  LABORATORY DATA: Lab Results  Component Value Date   WBC 8.5 10/21/2016   HGB 16.5 10/21/2016   HCT 49.3 10/21/2016   MCV 90.7 10/21/2016   PLT 101 (L) 10/21/2016      Chemistry      Component Value Date/Time   NA 142 04/22/2016 0758   K 3.9 04/22/2016 0758   CL 105 11/30/2015 0035   CL 106 09/30/2012 0949   CO2 25 04/22/2016 0758   BUN 14.9 04/22/2016 0758   CREATININE 0.9 04/22/2016 0758      Component Value Date/Time   CALCIUM 9.1 04/22/2016 0758   ALKPHOS 57 04/22/2016 0758   AST 19 04/22/2016 0758   ALT 28 04/22/2016 0758   BILITOT 0.78 04/22/2016 0758     BONE MARROW REPORT FINAL DIAGNOSIS RADIOGRAPHIC STUDIES: No results found.  ASSESSMENT AND PLAN:  This is a very pleasant 76 years old white male with idiopathic thrombocytopenic purpura who is currently on observation. His CBC today showed platelets count of 101,000 and the patient has no bleeding issues. I discussed the lab result with the patient today and recommended for him to continue on observation with repeat CBC, comprehensive metabolic panel and LDH in 6 months. For the persistent dizzy spells and weight loss, I recommended for the patient to have CT scan of the head with and without contrast to rule out any intracranial abnormalities. For the persistent cough, his most recent chest x-ray showed no concerning abnormalities but if the patient has not improvement in his condition, he may need further evaluation with CT imaging of the  chest. I'll see the patient back for follow-up visit in 6 months or sooner if needed. I advised the patient to call me immediately if he has any significant bleeding issues, bruises or ecchymosis or if his platelets count to be less than 50,000. All questions were answered. The patient knows to call the clinic with any problems, questions or concerns. We can certainly see the patient much sooner if necessary. I spent 15 minutes counseling the patient face to face. The total time spent in the appointment was 25 minutes.  Disclaimer: This note was dictated with voice recognition software. Similar sounding words can inadvertently be transcribed and may not be corrected upon review.

## 2016-10-21 NOTE — Progress Notes (Signed)
Spoke with pt and notified of results per Dr. Wert. Pt verbalized understanding and denied any questions. 

## 2016-10-28 ENCOUNTER — Ambulatory Visit: Payer: Medicare Other | Admitting: Internal Medicine

## 2016-10-29 ENCOUNTER — Ambulatory Visit (HOSPITAL_COMMUNITY)
Admission: RE | Admit: 2016-10-29 | Discharge: 2016-10-29 | Disposition: A | Discharge status: Home / Self Care | Payer: Medicare Other | Source: Ambulatory Visit | Attending: Internal Medicine | Admitting: Internal Medicine

## 2016-10-29 ENCOUNTER — Encounter (HOSPITAL_COMMUNITY): Payer: Self-pay

## 2016-10-29 DIAGNOSIS — D696 Thrombocytopenia, unspecified: Secondary | ICD-10-CM

## 2016-10-29 DIAGNOSIS — J209 Acute bronchitis, unspecified: Secondary | ICD-10-CM | POA: Diagnosis not present

## 2016-10-29 DIAGNOSIS — R42 Dizziness and giddiness: Secondary | ICD-10-CM

## 2016-10-29 DIAGNOSIS — J328 Other chronic sinusitis: Secondary | ICD-10-CM | POA: Diagnosis not present

## 2016-10-29 MED ORDER — IOPAMIDOL (ISOVUE-300) INJECTION 61%
75.0000 mL | Freq: Once | INTRAVENOUS | Status: AC | PRN
  Administered 2016-10-29: 75 mL via INTRAVENOUS

## 2016-10-29 MED ORDER — IOPAMIDOL (ISOVUE-300) INJECTION 61%
INTRAVENOUS | Status: DC
Start: 2016-10-29 — End: 2016-10-30
  Filled 2016-10-29: qty 75

## 2016-11-08 ENCOUNTER — Other Ambulatory Visit: Payer: Self-pay | Admitting: Cardiovascular Disease

## 2016-11-15 ENCOUNTER — Encounter: Payer: Self-pay | Admitting: Adult Health

## 2016-11-15 ENCOUNTER — Ambulatory Visit (INDEPENDENT_AMBULATORY_CARE_PROVIDER_SITE_OTHER): Payer: Medicare Other | Admitting: Adult Health

## 2016-11-15 VITALS — BP 122/62 | HR 63 | Ht 66.5 in | Wt 182.6 lb

## 2016-11-15 DIAGNOSIS — J45991 Cough variant asthma: Secondary | ICD-10-CM | POA: Diagnosis not present

## 2016-11-15 DIAGNOSIS — R9389 Abnormal findings on diagnostic imaging of other specified body structures: Secondary | ICD-10-CM

## 2016-11-15 DIAGNOSIS — J301 Allergic rhinitis due to pollen: Secondary | ICD-10-CM | POA: Diagnosis not present

## 2016-11-15 DIAGNOSIS — R938 Abnormal findings on diagnostic imaging of other specified body structures: Secondary | ICD-10-CM | POA: Diagnosis not present

## 2016-11-15 MED ORDER — BUDESONIDE-FORMOTEROL FUMARATE 80-4.5 MCG/ACT IN AERO: 2.0000 | INHALATION_SPRAY | Freq: Two times a day (BID) | RESPIRATORY_TRACT | 0 refills | Status: DC

## 2016-11-15 NOTE — Progress Notes (Signed)
@Patient  ID: [name], male    DOB: 26-Oct-1940, 76 y.o.   MRN: [id number]  Chief Complaint  Patient presents with  . Follow-up    ashtma     Referring provider: Adams-Doolittle, Benjam*  HPI: 76 yo male never smoker seen for initial pulmonary consult for cough 05/2015 felt to have cough variant asthma with AR .    TEST Joya San :  CTA Chest 11/2015 neg for PE , motion artifact.  Echo 11/2015 EF 65-70%, Gr 1 DD  07/22/2016    > try reduce symbicort to 80 2bid  - FENO 07/15/2016  =   42 on sym 160 2bid  - 07/30/2016  After extensive coaching HFA effectiveness =    90% > continue symb 80 2bid  - Sinus CT 07/31/2016 >>> neg -chronic elevation of right hemidiaphragm but neg sniff test 11/2015  -CBC 06/2016 eos neg  08/2016 Spirometry today shows FEV1 81%, ratio 85%, FVC 69%.   FENO 10/10/2016  =   12 during flare while on symb 80 2bid - PFT's  10/17/2016  FEV1 2.40 (89 % ) ratio 87  p 0 % improvement from saba p symb 80 x 2 prior to study with DLCO  60/58 % corrects to 90 % for alv volume and erv =50%   - Allergy profile 10/17/2016 >  Eos 0.0 /  IgE 55 Rast pos dust only   - 10/17/2016  Added singulair 10 mg   11/15/2016 Follow up ; Cough variant asthma  Patient returns for a one-month follow-up and medication review Patient been having difficulty with asthma. Complains of intermittent cough and wheezing. Patient was started on Singulair at last visit. He is feeling better w/ less cough and congestion Still has lingering sinus drainage , throat clearing and clear mucus . No wheeizng . No ventolin use .   We reviewed all his medications organize them into a medication count with patient education. It appears the patient is take his medications correctly. Did not qualify for pt assitance for symbicort, insurance will not cover. Unsure what alternatives are. He is going to check at home .      No Known Allergies  Immunization History  Administered Date(s) Administered  . Influenza  Split 06/01/2012  . Influenza Whole 05/30/2009, 04/20/2010  . Influenza-Unspecified 07/12/2013, 05/10/2016  . Pneumococcal Polysaccharide-23 08/22/2009  . Tdap 12/04/2010, 06/04/2011  . Zoster 05/17/2008    Past Medical History:  Diagnosis Date  . Abnormal chest x-ray   . Allergic rhinitis   . Ankylosing spondylitis ([place])   . Anxiety   . Arthritis   . Colonic polyp   . Coronary atherosclerosis of native coronary artery   . Cough   . Dizziness 10/21/2016  . Ganglion cyst of wrist    left  . Heart attack 2008  . Hemorrhoids   . History of malaria   . Hyperlipidemia    with low HDL  . Hypertension   . Kidney stone   . Other disorder of muscle, ligament, and fascia   . Rotator cuff tear   . Swelling of lower extremity 10/16/2015    Tobacco History: History  Smoking Status  . Never Smoker  Smokeless Tobacco  . Never Used   Counseling given: Not Answered   Outpatient Encounter Prescriptions as of 11/15/2016  Medication Sig  . albuterol (PROVENTIL HFA;VENTOLIN HFA) 108 (90 Base) MCG/ACT inhaler Inhale 2 puffs into the lungs every 4 (four) hours as needed for wheezing or shortness of breath (  cough, shortness of breath or wheezing.).  [name] amLODipine (NORVASC) 2.5 MG tablet TAKE 1 TABLET EVERY DAY  . aspirin 81 MG EC tablet Take 81 mg by mouth at bedtime.   . budesonide-formoterol (SYMBICORT) 80-4.5 MCG/ACT inhaler Inhale 2 puffs into the lungs 2 (two) times daily.  [name] dextromethorphan (DELSYM) 30 MG/5ML liquid as needed for cough.  . dextromethorphan-guaiFENesin (MUCINEX DM) 30-600 MG 12hr tablet Take 1 tablet by mouth 2 (two) times daily.  . famotidine (PEPCID) 20 MG tablet One at bedtime  . montelukast (SINGULAIR) 10 MG tablet Take 1 tablet (10 mg total) by mouth at bedtime.  . pantoprazole (PROTONIX) 40 MG tablet Take 1 tablet (40 mg total) by mouth daily. Take 30-60 min before first meal of the day  . simvastatin (ZOCOR) 20 MG tablet TAKE 1 TABLET EVERY DAY WITH BREAKFAST  .  traMADol (ULTRAM) 50 MG tablet 1-2 every 4 hours as needed for cough or pain  . [DISCONTINUED] budesonide-formoterol (SYMBICORT) 80-4.5 MCG/ACT inhaler Inhale 2 puffs into the lungs 2 (two) times daily.   No facility-administered encounter medications on file as of 11/15/2016.      Review of Systems  Constitutional:   No  weight loss, night sweats,  Fevers, chills, fatigue, or  lassitude.  HEENT:   No headaches,  Difficulty swallowing,  Tooth/dental problems, or  Sore throat,                No sneezing, itching, ear ache,  +nasal congestion, post nasal drip,   CV:  No chest pain,  Orthopnea, PND, swelling in lower extremities, anasarca, dizziness, palpitations, syncope.   GI  No heartburn, indigestion, abdominal pain, nausea, vomiting, diarrhea, change in bowel habits, loss of appetite, bloody stools.   Resp:    No chest wall deformity  Skin: no rash or lesions.  GU: no dysuria, change in color of urine, no urgency or frequency.  No flank pain, no hematuria   MS:  No joint pain or swelling.  No decreased range of motion.  No back pain.    Physical Exam  BP 122/62 (BP Location: Left Arm, Cuff Size: Normal)   Pulse 63   Ht 5' 6.5" (1.689 m)   Wt 182 lb 9.6 oz (82.8 kg)   SpO2 95%   BMI 29.03 kg/m   GEN: A/Ox3; pleasant , NAD, well nourished    HEENT:  [place]/AT,  EACs-clear, TMs-wnl, NOSE-clear, THROAT-clear, no lesions, no postnasal drip or exudate noted.   NECK:  Supple w/ fair ROM; no JVD; normal carotid impulses w/o bruits; no thyromegaly or nodules palpated; no lymphadenopathy.    RESP  Clear  P & A; w/o, wheezes/ rales/ or rhonchi. no accessory muscle use, no dullness to percussion  CARD:  RRR, no m/r/g, no peripheral edema, pulses intact, no cyanosis or clubbing.  GI:   Soft & nt; nml bowel sounds; no organomegaly or masses detected.   Musco: Warm bil, no deformities or joint swelling noted.   Neuro: alert, no focal deficits noted.    Skin: Warm, no lesions or  rashes    Lab Results:  CBC    Component Value Date/Time   WBC 8.5 10/21/2016 0802   WBC 6.8 10/17/2016 1144   RBC 5.44 10/21/2016 0802   RBC 5.12 10/17/2016 1144   HGB 16.5 10/21/2016 0802   HCT 49.3 10/21/2016 0802   PLT 101 (L) 10/21/2016 0802   MCV 90.7 10/21/2016 0802   MCH 30.3 10/21/2016 0802   MCH 30.0  06/24/2016 1154   MCHC 33.4 10/21/2016 0802   MCHC 34.2 10/17/2016 1144   RDW 15.3 (H) 10/21/2016 0802   LYMPHSABS 1.5 10/21/2016 0802   MONOABS 1.3 (H) 10/21/2016 0802   EOSABS 0.0 10/21/2016 0802   BASOSABS 0.0 10/21/2016 0802    BMET    Component Value Date/Time   NA 141 10/21/2016 0802   K 4.2 10/21/2016 0802   CL 105 11/30/2015 0035   CL 106 09/30/2012 0949   CO2 26 10/21/2016 0802   GLUCOSE 139 10/21/2016 0802   GLUCOSE 198 (H) 09/30/2012 0949   BUN 24.0 10/21/2016 0802   CREATININE 1.1 10/21/2016 0802   CALCIUM 9.7 10/21/2016 0802   GFRNONAA 84 11/28/2015 1008   GFRAA >89 11/28/2015 1008    BNP    Component Value Date/Time   BNP 17.2 11/28/2015 1008    ProBNP    Component Value Date/Time   PROBNP 53.0 09/12/2006 1303    Imaging: Ct Head W Wo Contrast  Result Date: 10/29/2016 CLINICAL DATA:  Dizziness unsteady gait EXAM: CT HEAD WITHOUT AND WITH CONTRAST TECHNIQUE: Contiguous axial images were obtained from the base of the skull through the vertex without and with intravenous contrast CONTRAST:  30mL ISOVUE-300 IOPAMIDOL (ISOVUE-300) INJECTION 61% COMPARISON:  CT head 08/19/2006 FINDINGS: Brain: No evidence of acute infarction, hemorrhage, hydrocephalus, extra-axial collection or mass lesion/mass effect. Vascular: Normal arterial and venous enhancement Skull: Negative Sinuses/Orbits: Moderate mucosal edema of the paranasal sinuses. No orbital lesion. Other: None IMPRESSION: No acute intracranial abnormality Moderate mucosal edema throughout the paranasal sinuses. Electronically Signed   By: [name] M.D.   On: 10/29/2016 16:34      Assessment & Plan:   Cough variant asthma  vs UACS Some improvement with trigger control  Hard to afford symbicort - will need to find alternative on his formulary , he will call back  Patient's medications were reviewed today and patient education was given. Computerized medication calendar was adjusted/completed  Recurrent fflare w/ slight interstial prominence on cxr will Check HRCT looking for ILD changes/bronchiectasis compoenent   Plan  Patient Instructions  May stop  Tramadol  Use Delsym 2 tsp every 12hr for coughing .  Use Mucinex Twice daily  For congestion and mucus.  Call back with formulary options for Symbicort.  Follow med calendar closely and bring to each visit .  Try not to clear throat , use sips of water .  Follow up Dr. Melvyn Novas  In 6-8 weeks and As needed   Set up for HRCT chest .      Allergic rhinitis Cont on current regimen   Plan  Patient Instructions  May stop  Tramadol  Use Delsym 2 tsp every 12hr for coughing .  Use Mucinex Twice daily  For congestion and mucus.  Call back with formulary options for Symbicort.  Follow med calendar closely and bring to each visit .  Try not to clear throat , use sips of water .  Follow up Dr. Melvyn Novas  In 6-8 weeks and As needed   Set up for HRCT chest .         Rexene Edison, NP 11/15/2016

## 2016-11-15 NOTE — Addendum Note (Signed)
Addended by: Clayborne Dana C on: 11/15/2016 04:51 PM   Modules accepted: Orders

## 2016-11-15 NOTE — Assessment & Plan Note (Addendum)
Some improvement with trigger control  Hard to afford symbicort - will need to find alternative on his formulary , he will call back  Patient's medications were reviewed today and patient education was given. Computerized medication calendar was adjusted/completed  Recurrent fflare w/ slight interstial prominence on cxr will Check HRCT looking for ILD changes/bronchiectasis compoenent   Plan  Patient Instructions  May stop  Tramadol  Use Delsym 2 tsp every 12hr for coughing .  Use Mucinex Twice daily  For congestion and mucus.  Call back with formulary options for Symbicort.  Follow med calendar closely and bring to each visit .  Try not to clear throat , use sips of water .  Follow up Dr. Melvyn Novas  In 6-8 weeks and As needed   Set up for HRCT chest .

## 2016-11-15 NOTE — Assessment & Plan Note (Signed)
Cont on current regimen   Plan  Patient Instructions  May stop  Tramadol  Use Delsym 2 tsp every 12hr for coughing .  Use Mucinex Twice daily  For congestion and mucus.  Call back with formulary options for Symbicort.  Follow med calendar closely and bring to each visit .  Try not to clear throat , use sips of water .  Follow up Dr. Melvyn Novas  In 6-8 weeks and As needed   Set up for HRCT chest .

## 2016-11-15 NOTE — Patient Instructions (Addendum)
May stop  Tramadol  Use Delsym 2 tsp every 12hr for coughing .  Use Mucinex Twice daily  For congestion and mucus.  Call back with formulary options for Symbicort.  Follow med calendar closely and bring to each visit .  Try not to clear throat , use sips of water .  Follow up Dr. Melvyn Novas  In 6-8 weeks and As needed   Set up for HRCT chest .

## 2016-11-16 NOTE — Progress Notes (Signed)
Chart and office note reviewed in detail  > agree with a/p as outlined    

## 2016-11-26 ENCOUNTER — Ambulatory Visit (INDEPENDENT_AMBULATORY_CARE_PROVIDER_SITE_OTHER)
Admission: RE | Admit: 2016-11-26 | Discharge: 2016-11-26 | Disposition: A | Discharge status: Home / Self Care | Payer: Medicare Other | Source: Ambulatory Visit | Attending: Adult Health | Admitting: Adult Health

## 2016-11-26 DIAGNOSIS — R0989 Other specified symptoms and signs involving the circulatory and respiratory systems: Secondary | ICD-10-CM | POA: Diagnosis not present

## 2016-11-26 DIAGNOSIS — R9389 Abnormal findings on diagnostic imaging of other specified body structures: Secondary | ICD-10-CM

## 2016-11-26 DIAGNOSIS — R938 Abnormal findings on diagnostic imaging of other specified body structures: Secondary | ICD-10-CM

## 2016-11-26 DIAGNOSIS — J45991 Cough variant asthma: Secondary | ICD-10-CM

## 2016-11-27 ENCOUNTER — Encounter: Payer: Self-pay | Admitting: Adult Health

## 2016-11-27 DIAGNOSIS — J849 Interstitial pulmonary disease, unspecified: Secondary | ICD-10-CM | POA: Insufficient documentation

## 2016-11-28 ENCOUNTER — Encounter: Payer: Self-pay | Admitting: Cardiovascular Disease

## 2016-12-12 ENCOUNTER — Ambulatory Visit (INDEPENDENT_AMBULATORY_CARE_PROVIDER_SITE_OTHER): Payer: Medicare Other | Admitting: Internal Medicine

## 2016-12-12 ENCOUNTER — Encounter: Payer: Self-pay | Admitting: Internal Medicine

## 2016-12-12 VITALS — BP 144/70 | HR 67 | Ht 66.5 in | Wt 186.0 lb

## 2016-12-12 DIAGNOSIS — J479 Bronchiectasis, uncomplicated: Secondary | ICD-10-CM | POA: Diagnosis not present

## 2016-12-12 DIAGNOSIS — J309 Allergic rhinitis, unspecified: Secondary | ICD-10-CM

## 2016-12-12 DIAGNOSIS — J849 Interstitial pulmonary disease, unspecified: Secondary | ICD-10-CM

## 2016-12-12 DIAGNOSIS — J45991 Cough variant asthma: Secondary | ICD-10-CM | POA: Diagnosis not present

## 2016-12-12 MED ORDER — FLUTTER DEVI: 0 refills | Status: DC

## 2016-12-12 NOTE — Patient Instructions (Addendum)
Stop mucinex / delsym and symbicort and just use ventolin as needed   Work on inhaler technique:  relax and gently blow all the way out then take a nice smooth deep breath back in, triggering the inhaler at same time you start breathing in.  Hold for up to 5 seconds if you can. Blow out thru nose. Rinse and gargle with water when done    Add mucinex dm 1200 mg twice daily and flutter valve as much as you can   Flonase twice daily each nostril  See calendar for specific medication instructions and bring it back for each and every office visit for every healthcare provider you see.  Without it,  you may not receive the best quality medical care that we feel you deserve.  You will note that the calendar groups together  your maintenance  medications that are timed at particular times of the day.  Think of this as your checklist for what your doctor has instructed you to do until your next evaluation to see what benefit  there is  to staying on a consistent group of medications intended to keep you well.  The other group at the bottom is entirely up to you to use as you see fit  for specific symptoms that may arise between visits that require you to treat them on an as needed basis.  Think of this as your action plan or "what if" list.   Separating the top medications from the bottom group is fundamental to providing you adequate care going forward.    Please schedule a follow up office visit in 4 weeks, sooner if needed with med calendar

## 2016-12-12 NOTE — Progress Notes (Signed)
Brief patient profile:  76 yowm never smoker with new onset variable  cough since Oct 2016 comes and goes s background of asthma but with background of nasal allergies for which took shots and moved residence in 1980s  >  No further problems except nasal drip /[place] then fine [place] going there.    History of Present Illness  Congestion in chest x Oct 2016 worse in evenings with mostly dry coughing and much worse x sev weeks > Sood eval on 07/15/16 cough for a week/ albterol and pred no better  rec Advair one puff twice per day until sample completed >> call if you feel you need a refill after this Zithromax 250 mg pill >> 2 pills on day 1, then 1 pill daily for next four days Albuterol 2 puffs every 4 to 6 hours as needed for cough, wheeze, or chest congestion Finish course of prednisone    07/22/2016 acute extended ov/Jj Enyeart re:  Refractory cough > sob on advair and pred taper and getting worse Chief Complaint  Patient presents with  . Acute Visit    Increased SOB and cough are not improving since acute ov here 07/15/16. His cough has been occ prod with light colored sputum.  He states that his breathing does okay in the am's and then gets worse in the evenings.   on advair dpi/ benzoate and took pred 10 mg one tablet  This am and cough seems ok when he wakes up and progressively worse with Minimal actual sputum production as the day goes on and then does not really bother him while sleeping.. Shaking on details of care/ names of meds/ no better with saba  rec Take delsym two tsp every 12 hours and supplement if needed with  tramadol 50 mg up to 1 every 4 hours  .   Symbicort 80 Take 2 puffs first thing in am and then another 2 puffs about 12 hours later - if helping refill it Only use your albuterol (ventolin) inhaler  As long as coughing at all:  Try prilosec otc 20mg   Take 30-60 min before first meal of the day and Pepcid ac (famotidine) 20 mg  one @  bedtime until cough is completely gone for at least a week without the need for cough suppression GERD diet    07/30/2016 acute extended ov/Evan Mackie re: refractory cough since late Nov 2017  Chief Complaint  Patient presents with  . Acute Visit    Pt c/o increased cough and congestion for the past 2 wks. He states symptoms get worse in the evening. Cough is prod in the am's with thick, clear sputum.  He states he can not lie down flat due to cough and cough sometimes waked him up. He also c/o wheezing.   brought all his meds, not using symb 2bid per count, only missing 11 tramadol "it's for pain, right? "  Some better while on pred, worse since finished it/ only taking prilosec otc 20 mg qam / is taking pepcid 20 mg hs  Not using any saba "it's for emergencies, right?"  rec Prednisone 10 mg take  4 each am x 2 days,   2 each am x 2 days,  1 each am x 2 days and stop  Increase the prilosec ac to where you take x 2 30-60 mn before breakfast  And continue the pepcid ac 20mg  in evening  Continue symbicort 80 (same as dulera 100 sample) Take  2 puffs first thing in am and then another 2 puffs about 12 hours later.  Only use your albuterol as a rescue medication  For cough use mucinex dm 1200 mg every 12 hours and supplement with the tramadol up to 2 every 4 hours until no cough x 3 days  Please see patient coordinator before you leave today  to schedule sinus CT    08/21/16  Restart Symbicort 2 puffs Twice daily  .  Restart Prilosec 20mg  daily before meal  Restart Pepcid 20mg  At bedtime   May use Zyrtec 10mg  At bedtime  As needed  Drainage  Zpack take as directed.  Mucinex DM Twice daily  As needed  Cough/congestion .     10/10/2016 acute extended ov/Masiya Claassen re:  uacs vs asthma  maint on symb 80 2bid / gerd rx with otcs only  Chief Complaint  Patient presents with  . Acute Visit    Pt c/o cough and increased SOB x 1 wk, and progressively worse over the past 3-4 days. His cough is prod with  thick, clear sputum. He states can not lie down flat without cough.   sensation of something stuck never gone away since = oct 2016 then acutely worse sob/ cough x 1 week prior to OV  And not better p saba Does not have mucinex dm / no zyrtec  rec First take delsym two tsp every 12 hours and supplement if needed with  tramadol 50 mg up to 2 every 4 hours to suppress the urge to cough at all or even clear your throat. Swallowing water or using ice chips/non mint and menthol containing candies (such as lifesavers or sugarless jolly ranchers) are also effective.  You should rest your voice and avoid activities that you know make you cough. Once you have eliminated the cough for 3 straight days try reducing the tramadol first,  then the delsym as tolerated.   Prednisone 10 mg take  4 each am x 2 days,   2 each am x 2 days,  1 each am x 2 days and stop (this is to eliminate allergies and inflammation from coughing) Protonix (pantoprazole) Take 30-60 min before first meal of the day and Pepcid 20 mg one bedtime plus chlorpheniramine 4 mg x 2 at bedtime (both available over the counter)  until cough is completely gone for at least a week without the need for cough suppression For drainage / throat tickle try take CHLORPHENIRAMINE  4 mg - take one every 4 hours as needed - available over the counter- may cause drowsiness so start with just a bedtime dose or two and see how you tolerate it before trying in daytime   GERD diet  Stop prilosec/ mucinex  Please remember to go to the lab  department downstairs in the basement  for your tests - we will call you with the results when they are available.  Please schedule a follow up office visit in 2 weeks, sooner if needed  with all medications /inhalers/ solutions in hand so we can verify exactly what you are taking. This includes all medications from all doctors and over the counters Add did not got to lab as rec    10/17/2016  f/u ov/Ashtan Girtman re: intermittent cough x  oct 2016 def gets better on prednisone / sense of chest congestion but no excess mucus Chief Complaint  Patient presents with  . Follow-up    PFT's done today.  symptoms of daily cough  worse p supper  Did not bring meds as requested / not clear he even read them when reading them back line by line  Not using any albuterol "doesn't help"  Not able to lie down due to cough x month x no better even while on prednisone though "80% better" on pred vs off it/ no longer using cpap rec Add singulair each evening 10 daily trial basis  Please remember to go to the lab  department downstairs in the basement  for your tests - we will call you with the results when they are available. See Tammy NP in 4  weeks with all your medications, same page with your medication use she will arrange follow up with me or with allergy depending on your tests  Add : consider adding flutter if doesn't have one and HRCT looking for bronchiectasis      11/15/16 NP eval May stop  Tramadol  Use Delsym 2 tsp every 12hr for coughing .  Use Mucinex Twice daily  For congestion and mucus.  Call back with formulary options for Symbicort.  Follow med calendar closely and bring to each visit     12/12/2016  f/u ov/Libni Fusaro re:  Chronic cough/ bronchiectasis / not convinced symb 80 helping  Chief Complaint  Patient presents with  . Follow-up    Breathing is "fine". He still c/o chest congestion. He is not coughing much but when he does it is prod with cream colored sputum.     Not limited by breathing from desired activities  / no need for saba   No obvious day to day or daytime variability or assoc  mucus plugs or hemoptysis or cp or chest tightness, subjective wheeze or overt sinus or hb symptoms. No unusual exp hx or h/o childhood pna/ asthma or knowledge of premature birth.  Sleeping ok without nocturnal  or early am exacerbation  of respiratory  c/o's or need for noct saba. Also denies any obvious fluctuation of symptoms with  weather or environmental changes or other aggravating or alleviating factors except as outlined above   Current Medications, Allergies, Complete Past Medical History, Past Surgical History, Family History, and Social History were reviewed in [employer] record.  ROS  The following are not active complaints unless bolded sore throat, dysphagia, dental problems, itching, sneezing,  nasal congestion or excess/ purulent secretions, ear ache,   fever, chills, sweats, unintended wt loss, classically pleuritic or exertional cp,  orthopnea pnd or leg swelling, presyncope, palpitations, abdominal pain, anorexia, nausea, vomiting, diarrhea  or change in bowel or bladder habits, change in stools or urine, dysuria,hematuria,  rash, arthralgias, visual complaints, headache, numbness, weakness or ataxia or problems with walking or coordination,  change in mood/affect or memory.               Pulmonary tests CT angio chest 11/30/15 >> motion artifact, mosaic pattern, calcified granuloma SNIFF test 11/30/15 >> elevated Rt diaphragm, but has b/l diaphragm movement  Cardiac tests 10/16/15 Doppler legs b/l >> no DVT 11/30/15 Echo >> EF 65 to 30%, grade 1 diastolic CHF  Sleep tests HST 12/21/15 >> AHI 44.5, SpO2 low 64% Auto CPAP 03/02/16 to 03/31/16 >> used on 26 of 30 nights with average 6 hrs 32 min.  Average AHI 5.5 with median CPAP 8 and 95 th percentile CPAP 11 cm H2O ONO with CPAP 04/10/16 >> test time 6 hrs 30 min.  Mean SpO2 93.51%, low SpO2 77%.  Spent 7 min with SpO2 < 88%  Past medical history Ankylosing spondylitis, Allergies, CAD, HLD, HTN, Nephrolithiasis, Malaria      Physical Exam  amb obese wm nad with nasal tone to voice      12/12/2016         186  10/17/2016         183  10/10/2016         187  07/30/2016     191  07/22/16 194 lb (88 kg)  07/15/16 196 lb (88.9 kg)  07/12/16 192 lb 9.6 oz (87.4 kg)    Vital signs reviewed    - Note on arrival 02 sats  94% on RA      General - No distress ENT - No sinus tenderness, clear nasal discharge, no oral exudate, no LAN Cardiac - s1s2 regular, no murmur Chest -  Minimal insp /exp rhonci bilaterally s junky sounding cough on exp maneuvers, some better with plm  Back - No focal tenderness Abd - Soft, non-tender Ext - No edema Neuro - Normal strength Skin - No rashes Psych - normal mood, and behavior    I personally reviewed images and agree with radiology impression as follows:  CT 11/26/16 = HRCT Chest    . The appearance of the lungs is suggestive of interstitial lung disease, with a pattern that is most compatible with nonspecific interstitial pneumonia (NSIP). The possibility of very mild chronic hypersensitivity pneumonitis should also be considered. No imaging features at this time to strongly suggest usual interstitial pneumonia (UIP). Repeat high-resolution chest CT is recommended in 12 months to assess for temporal changes in the appearance of the lung parenchyma      Assessment/Plan

## 2016-12-15 ENCOUNTER — Encounter: Payer: Self-pay | Admitting: Internal Medicine

## 2016-12-15 DIAGNOSIS — J479 Bronchiectasis, uncomplicated: Secondary | ICD-10-CM | POA: Insufficient documentation

## 2016-12-15 NOTE — Assessment & Plan Note (Signed)
Reviewed approp use of topical steroids = flonase

## 2016-12-15 NOTE — Assessment & Plan Note (Addendum)
See CT chest 11/26/16 - flutter added 12/12/2016 / trained on technique  Discussed pathophysiology and treatment emphasizing this condition can be treated by not cured  I had an extended discussion with the patient reviewing all relevant studies completed to date and  lasting 15 to 20 minutes of a 25 minute visit    Each maintenance medication was reviewed in detail including most importantly the difference between maintenance and prns and under what circumstances the prns are to be triggered using an action plan format that is not reflected in the computer generated alphabetically organized AVS but trather by a customized med calendar that reflects the AVS meds with confirmed 100% correlation.   In addition, Please see AVS for unique instructions that I personally wrote and verbalized to the the pt in detail and then reviewed with pt  by my nurse highlighting any  changes in therapy recommended at today's visit to their plan of care.

## 2016-12-15 NOTE — Assessment & Plan Note (Signed)
HRCT chest 4/17/ 2018 >  suggestive of interstitial lung disease, with a pattern that is most compatible with nonspecificinterstitial pneumonia (NSIP).   Assoc with bronchiectasis noted, focus on this component for now (see separate a/p)

## 2016-12-15 NOTE — Assessment & Plan Note (Addendum)
07/22/2016    > try reduce symbicort to 80 2bid  - FENO 07/15/2016  =   42 on sym 160 2bid  - 07/30/2016  After extensive coaching HFA effectiveness =    90% > continue symb 80 2bid  - Sinus CT 07/31/2016 >>> neg - FENO 10/10/2016  =   12 during flare while on symb 80 2bid - PFT's  10/17/2016  FEV1 2.40 (89 % ) ratio 87  p 0 % improvement from saba p symb 80 x 2 prior to study with DLCO  60/58 % corrects to 90 % for alv volume and erv =50%   - Allergy profile 10/17/2016 >  Eos 0.0 /  IgE 55 Rast pos dust only   - 10/17/2016  Added singulair 10 mg  - HRCT chest 11/2016  -NSIP Changes/Bronchiectasis noted   - 12/12/2016 spirometry s obst with active symptoms   Not really responding well in terms of cough with rx as asthma and no evidence on pfts to support it so rec trial off symbicort and just focus on the bronchiectasis component (see separate a/p)

## 2016-12-15 NOTE — Assessment & Plan Note (Signed)
Body mass index is 29.57 kg/m.  -  trending up Lab Results  Component Value Date   TSH 3.16 10/26/2013     Contributing to gerd risk/ doe/reviewed the need and the process to achieve and maintain neg calorie balance > defer f/u primary care including intermittently monitoring thyroid status

## 2016-12-19 ENCOUNTER — Ambulatory Visit: Payer: Medicare Other | Admitting: Internal Medicine

## 2016-12-27 ENCOUNTER — Ambulatory Visit: Payer: Medicare Other | Admitting: Internal Medicine

## 2017-01-01 ENCOUNTER — Encounter: Payer: Self-pay | Admitting: Cardiovascular Disease

## 2017-01-01 ENCOUNTER — Ambulatory Visit (INDEPENDENT_AMBULATORY_CARE_PROVIDER_SITE_OTHER): Payer: Medicare Other | Admitting: Cardiovascular Disease

## 2017-01-01 VITALS — BP 126/70 | HR 71 | Ht 67.0 in | Wt 183.6 lb

## 2017-01-01 DIAGNOSIS — I2510 Atherosclerotic heart disease of native coronary artery without angina pectoris: Secondary | ICD-10-CM

## 2017-01-01 DIAGNOSIS — R0602 Shortness of breath: Secondary | ICD-10-CM

## 2017-01-01 NOTE — Patient Instructions (Signed)
Medication Instructions:  Your physician recommends that you continue on your current medications as directed. Please refer to the Current Medication list given to you today.  Labwork: Your physician recommends that you return for a FASTING LIPID panel--nothing to eat or drink after midnight, lab opens at 7:30 AM  Testing/Procedures: Your physician has requested that you have an exercise stress myoview. For further information please visit [email or web address]. Please follow instruction sheet, as given.  Follow-Up: Your physician wants you to follow-up in: 1 YEAR with Dr Burt Knack.  You will receive a reminder letter in the mail two months in advance. If you don't receive a letter, please call our office to schedule the follow-up appointment.   Any Other Special Instructions Will Be Listed Below (If Applicable).     If you need a refill on your cardiac medications before your next appointment, please call your pharmacy.

## 2017-01-01 NOTE — Progress Notes (Signed)
Cardiology Office Note Date:  01/01/2017   ID:  [name], DOB 1941-02-02, MRN [id number]  PCP:  Adams-Doolittle, [name], MD  Cardiologist:  [name], MD    Chief Complaint  Patient presents with  . Coronary Artery Disease    follow up     History of Present Illness: [name] is a 76 y.o. male who presents for  follow-up of CAD. The patient initially presented in 2008 with an anterior wall MI complicated by out of hospital ventricular fibrillation arrest. He underwent primary PCI with stenting of the LAD (bare-metal). His left ventricular function had normalized on followup. He had a repeat heart catheterization in January 2014 after presenting with CCS class II angina and an abnormal exercise treadmill study. This demonstrated continued patency of his LAD stent with nonobstructive disease in the right coronary artery and left circumflex. There was moderate stenosis in the diagonal branch and this was thought to be responsible for his anginal symptoms and treadmill abnormality. Medical therapy was recommended. Other medical problems include hypertension, hyperlipidemia, and ITP.  The patient is here alone today. He reports fatigue and shortness of breath with physical activity. Symptoms are a little bit worse than last year. He notices this when he pushes his trashcan up a hill to his house. He denies chest pain or pressure. He has not had any orthopnea, PND, leg swelling, or heart palpitations. He's had recent blood work done showing improvement in his platelet count.  Past Medical History:  Diagnosis Date  . Abnormal chest x-ray   . Allergic rhinitis   . Ankylosing spondylitis ([place])   . Anxiety   . Arthritis   . Colonic polyp   . Coronary atherosclerosis of native coronary artery   . Cough   . Dizziness 10/21/2016  . Ganglion cyst of wrist    left  . Heart attack ([place]) 2008  . Hemorrhoids   . History of malaria   . Hyperlipidemia    with low HDL  .  Hypertension   . Kidney stone   . Other disorder of muscle, ligament, and fascia   . Rotator cuff tear   . Swelling of lower extremity 10/16/2015    Past Surgical History:  Procedure Laterality Date  . HEMORRHOID SURGERY  10/10   in [place]  . LEFT HEART CATHETERIZATION WITH CORONARY ANGIOGRAM N/A 09/03/2012   Procedure: LEFT HEART CATHETERIZATION WITH CORONARY ANGIOGRAM;  Surgeon: [name], MD;  Location: [facility] CATH LAB;  Service: Cardiovascular;  Laterality: N/A;  . ROTATOR CUFF REPAIR     bilat by Dr. French Ana    Current Outpatient Prescriptions  Medication Sig Dispense Refill  . amLODipine (NORVASC) 2.5 MG tablet Take 2.5 mg by mouth daily.    [name] Apoaequorin (PREVAGEN PO) Take 1 Dose by mouth every morning. 1 every morning     . aspirin 81 MG EC tablet Take 81 mg by mouth at bedtime.     . chlorpheniramine (CHLOR-TRIMETON) 4 MG tablet Take 4 mg by mouth every 4 (four) hours as needed for allergies.    . Dextromethorphan-Guaifenesin (MUCINEX DM PO) Take 1,200 mg by mouth 2 (two) times daily.    . famotidine (PEPCID) 20 MG tablet Take 20 mg by mouth at bedtime.    . fluticasone (FLONASE) 50 MCG/ACT nasal spray Place 1 spray into both nostrils 2 (two) times daily.    . montelukast (SINGULAIR) 10 MG tablet Take 1 tablet (10 mg total) by mouth at bedtime. 30 tablet 11  .  pantoprazole (PROTONIX) 40 MG tablet Take 1 tablet (40 mg total) by mouth daily. Take 30-60 min before first meal of the day 30 tablet 2  . Respiratory Therapy Supplies (FLUTTER) DEVI Use as directed 1 each 0  . simvastatin (ZOCOR) 20 MG tablet Take 20 mg by mouth daily with breakfast.     No current facility-administered medications for this visit.     Allergies:   Patient has no known allergies.   Social History:  The patient  reports that he has never smoked. He has never used smokeless tobacco. He reports that he does not drink alcohol or use drugs.   Family History:  The patient's  family history includes Cancer in  his mother; Diabetes in his mother; Emphysema in his mother; Rheum arthritis in his mother.    ROS:  Please see the history of present illness.  Otherwise, review of systems is positive for Chronic cough, shortness of breath with activity.  All other systems are reviewed and negative.    PHYSICAL EXAM: VS:  BP 126/70   Pulse 71   Ht 5\' 7"  (1.702 m)   Wt 183 lb 9.6 oz (83.3 kg)   BMI 28.76 kg/m  , BMI Body mass index is 28.76 kg/m. GEN: Well nourished, well developed, in no acute distress  HEENT: normal  Neck: no JVD, no masses. No carotid bruits Cardiac: RRR without murmur or gallop                Respiratory:  clear to auscultation bilaterally, normal work of breathing GI: soft, nontender, nondistended, + BS MS: no deformity or atrophy  Ext: no pretibial edema, pedal pulses 2+= bilaterally Skin: warm and dry, no rash Neuro:  Strength and sensation are intact Psych: euthymic mood, full affect  EKG:  EKG is ordered today. The ekg ordered today shows normal sinus rhythm 68 bpm, nonspecific ST abnormality  Recent Labs: 10/21/2016: ALT 19; BUN 24.0; Creatinine 1.1; HGB 16.5; Platelets 101; Potassium 4.2; Sodium 141   Lipid Panel     Component Value Date/Time   CHOL 124 (L) 12/18/2015 0831   TRIG 71 12/18/2015 0831   HDL 45 12/18/2015 0831   CHOLHDL 2.8 12/18/2015 0831   VLDL 14 12/18/2015 0831   LDLCALC 65 12/18/2015 0831      Wt Readings from Last 3 Encounters:  01/01/17 183 lb 9.6 oz (83.3 kg)  12/12/16 186 lb (84.4 kg)  11/15/16 182 lb 9.6 oz (82.8 kg)     Cardiac Studies Reviewed: Exercise Myoview 2016: QPS Raw Data Images:  Normal; no motion artifact; normal heart/lung ratio. Stress Images:  Mild focal inferior wall decreased uptake seen at both rest and stress. No ischemia identified. Otherwise, homogeneous radiotracer uptake. Rest Images:  As above. Subtraction (SDS):  No evidence of ischemia. Transient Ischemic Dilatation (Normal <1.22):  0.90 Lung/Heart  Ratio (Normal <0.45):  0.36  Quantitative Gated Spect Images QGS EDV:  88 ml QGS ESV:  42 ml  Impression Exercise Capacity:  Fair exercise capacity. 7 minutes BP Response:  Normal blood pressure response. 591 up to 638 systolic. Clinical Symptoms:  There is dyspnea. leg fatigue  ECG Impression:  Significant ST abnormalities consistent with ischemia. Comparison with Prior Nuclear Study: No images to compare  Overall Impression:  Low risk stress nuclear study with no perfusion defects suggestive of ischemia. There was no transient ischemic dilatation. Blood pressure response was normal to exercise.. EKG findings on treadmill however were markedly abnormal however in review of prior  nuclear stress test in 2004, similar EKG findings were noted. In 2008, he suffered V. fib arrest and was found to have a proximal occluded LAD which was successfully opened by Dr. Olevia Perches. There is no evidence of anterior wall or anteroseptal wall ischemia.   LV Ejection Fraction: 53%.  LV Wall Motion:  Mild hypokinesis of distal septal wall.  ASSESSMENT AND PLAN: 1.  CAD, native vessel: no angina. The patient has progressive exertional dyspnea. I am going to check an exercise Myoview stress test to evaluate him for inducible ischemia. He will continue on his current medical program. I do not appreciate any signs of volume overload or heart failure on his physical examination.  2. HTN: BP under ideal control. On amlodipine 2.5 mg daily.  3. Hyperlipidemia: treated with simvastatin. Last lipids are reviewed. Will repeat labs when he returns for his stress test. He continues on a statin drug.  Current medicines are reviewed with the patient today.  The patient does not have concerns regarding medicines.  Labs/ tests ordered today include:  No orders of the defined types were placed in this encounter.  Disposition:   FU one year unless stress test is abnormal  Signed, [name], MD  01/01/2017 4:46 PM     [facility] [place], [place], [place]  [address] Phone: [phone]; Fax: [phone]

## 2017-01-02 ENCOUNTER — Telehealth (HOSPITAL_COMMUNITY): Payer: Self-pay | Admitting: *Deleted

## 2017-01-02 NOTE — Telephone Encounter (Signed)
Left message on voicemail in reference to upcoming appointment scheduled for  01/08/17. Phone number given for a call back so details instructions can be given. Kirstie Peri

## 2017-01-07 ENCOUNTER — Telehealth (HOSPITAL_COMMUNITY): Payer: Self-pay | Admitting: *Deleted

## 2017-01-07 NOTE — Telephone Encounter (Signed)
Patient given detailed instructions per Myocardial Perfusion Study Information Sheet for the test on 01/08/17 at 0730. Patient notified to arrive 15 minutes early and that it is imperative to arrive on time for appointment to keep from having the test rescheduled.  If you need to cancel or reschedule your appointment, please call the office within 24 hours of your appointment. . Patient verbalized understanding.Anjolie Majer, Ranae Palms

## 2017-01-08 ENCOUNTER — Ambulatory Visit: Payer: Medicare Other | Admitting: *Deleted

## 2017-01-08 ENCOUNTER — Ambulatory Visit (HOSPITAL_COMMUNITY): Payer: Medicare Other | Attending: Cardiovascular Disease

## 2017-01-08 VITALS — Ht 67.0 in | Wt 183.0 lb

## 2017-01-08 DIAGNOSIS — I10 Essential (primary) hypertension: Secondary | ICD-10-CM | POA: Insufficient documentation

## 2017-01-08 DIAGNOSIS — I2510 Atherosclerotic heart disease of native coronary artery without angina pectoris: Secondary | ICD-10-CM

## 2017-01-08 DIAGNOSIS — R0602 Shortness of breath: Secondary | ICD-10-CM

## 2017-01-08 DIAGNOSIS — R0609 Other forms of dyspnea: Secondary | ICD-10-CM | POA: Insufficient documentation

## 2017-01-08 DIAGNOSIS — R9439 Abnormal result of other cardiovascular function study: Secondary | ICD-10-CM | POA: Diagnosis not present

## 2017-01-08 DIAGNOSIS — R5383 Other fatigue: Secondary | ICD-10-CM | POA: Diagnosis not present

## 2017-01-08 LAB — MYOCARDIAL PERFUSION IMAGING
CHL CUP MPHR: 147 {beats}/min
CHL CUP NUCLEAR SDS: 3
CHL CUP NUCLEAR SRS: 6
CSEPED: 7 min
CSEPEDS: 15 s
CSEPEW: 7.7 METS
CSEPHR: 93 %
CSEPPHR: 137 {beats}/min
LV dias vol: 100 mL (ref 62–150)
LVSYSVOL: 43 mL
RATE: 0.35
RPE: 19
Rest HR: 55 {beats}/min
SSS: 9
TID: 0.82

## 2017-01-08 LAB — LIPID PANEL
CHOL/HDL RATIO: 2.7 ratio (ref 0.0–5.0)
Cholesterol, Total: 105 mg/dL (ref 100–199)
HDL: 39 mg/dL — ABNORMAL LOW (ref >39)
LDL Calculated: 55 mg/dL (ref 0–99)
Triglycerides: 57 mg/dL (ref 0–149)
VLDL Cholesterol Cal: 11 mg/dL (ref 5–40)

## 2017-01-08 MED ORDER — TECHNETIUM TC 99M TETROFOSMIN IV KIT
32.8000 | PACK | Freq: Once | INTRAVENOUS | Status: AC | PRN
  Administered 2017-01-08: 32.8 via INTRAVENOUS
  Filled 2017-01-08: qty 33

## 2017-01-08 MED ORDER — TECHNETIUM TC 99M TETROFOSMIN IV KIT
10.4000 | PACK | Freq: Once | INTRAVENOUS | Status: AC | PRN
  Administered 2017-01-08: 10.4 via INTRAVENOUS
  Filled 2017-01-08: qty 11

## 2017-01-13 ENCOUNTER — Encounter: Payer: Self-pay | Admitting: Internal Medicine

## 2017-01-13 ENCOUNTER — Ambulatory Visit (INDEPENDENT_AMBULATORY_CARE_PROVIDER_SITE_OTHER): Payer: Medicare Other | Admitting: Internal Medicine

## 2017-01-13 ENCOUNTER — Telehealth: Payer: Self-pay

## 2017-01-13 VITALS — BP 144/72 | HR 61 | Ht 67.0 in | Wt 183.0 lb

## 2017-01-13 DIAGNOSIS — J45991 Cough variant asthma: Secondary | ICD-10-CM | POA: Diagnosis not present

## 2017-01-13 DIAGNOSIS — I2510 Atherosclerotic heart disease of native coronary artery without angina pectoris: Secondary | ICD-10-CM

## 2017-01-13 DIAGNOSIS — J479 Bronchiectasis, uncomplicated: Secondary | ICD-10-CM | POA: Diagnosis not present

## 2017-01-13 DIAGNOSIS — J849 Interstitial pulmonary disease, unspecified: Secondary | ICD-10-CM

## 2017-01-13 MED ORDER — AZELASTINE-FLUTICASONE 137-50 MCG/ACT NA SUSP
1.0000 | Freq: Two times a day (BID) | NASAL | 0 refills | Status: DC
Start: 2017-01-13 — End: 2017-01-13

## 2017-01-13 MED ORDER — AZELASTINE-FLUTICASONE 137-50 MCG/ACT NA SUSP: 1.0000 | Freq: Two times a day (BID) | NASAL | 11 refills | Status: DC

## 2017-01-13 NOTE — Patient Instructions (Addendum)
Try dymista one twice daily each nostril to see if helps and if so then you can fill it (or go back to generic flonase and astelin).  Remember to point it toward your ear on the same side.  See Tammy NP in 4  weeks with all your medications, even over the counter meds, separated in two separate bags, the ones you take no matter what vs the ones you stop once you feel better and take only as needed when you feel you need them.   Tammy  will generate for you a new user friendly medication calendar that will put [place] all on the same page re: your medication use.     Without this process, it simply isn't possible to assure that we are providing  your outpatient care  with  the attention to detail we feel you deserve.   If we cannot assure that you're getting that kind of care,  then we cannot manage your problem effectively from this clinic.  Once you have seen Tammy and we are sure that we're all on the same page with your medication use she will arrange follow up with me.

## 2017-01-13 NOTE — Progress Notes (Signed)
Brief patient profile:  50 yowm never smoker with new onset variable  cough since Oct 2016 comes and goes s background of asthma but with background of nasal allergies for which took shots and moved residence in 1980s  >  No further problems except nasal drip /[place] then fine when stopped going there.    History of Present Illness  Congestion in chest x Oct 2016 worse in evenings with mostly dry coughing and much worse x sev weeks > Sood eval on 07/15/16 cough for a week/ albterol and pred no better  rec Advair one puff twice per day until sample completed >> call if you feel you need a refill after this Zithromax 250 mg pill >> 2 pills on day 1, then 1 pill daily for next four days Albuterol 2 puffs every 4 to 6 hours as needed for cough, wheeze, or chest congestion Finish course of prednisone    07/22/2016 acute extended ov/Chrishauna Mee re:  Refractory cough > sob on advair and pred taper and getting worse Chief Complaint  Patient presents with  . Acute Visit    Increased SOB and cough are not improving since acute ov here 07/15/16. His cough has been occ prod with light colored sputum.  He states that his breathing does okay in the am's and then gets worse in the evenings.   on advair dpi/ benzoate and took pred 10 mg one tablet  This am and cough seems ok when he wakes up and progressively worse with Minimal actual sputum production as the day goes on and then does not really bother him while sleeping.. Shaking on details of care/ names of meds/ no better with saba  rec Take delsym two tsp every 12 hours and supplement if needed with  tramadol 50 mg up to 1 every 4 hours  .   Symbicort 80 Take 2 puffs first thing in am and then another 2 puffs about 12 hours later - if helping refill it Only use your albuterol (ventolin) inhaler  As long as coughing at all:  Try prilosec otc 20mg   Take 30-60 min before first meal of the day and Pepcid ac (famotidine) 20 mg  one @  bedtime until cough is completely gone for at least a week without the need for cough suppression GERD diet    07/30/2016 acute extended ov/Shayna Eblen re: refractory cough since late Nov 2017  Chief Complaint  Patient presents with  . Acute Visit    Pt c/o increased cough and congestion for the past 2 wks. He states symptoms get worse in the evening. Cough is prod in the am's with thick, clear sputum.  He states he can not lie down flat due to cough and cough sometimes waked him up. He also c/o wheezing.   brought all his meds, not using symb 2bid per count, only missing 11 tramadol "it's for pain, right? "  Some better while on pred, worse since finished it/ only taking prilosec otc 20 mg qam / is taking pepcid 20 mg hs  Not using any saba "it's for emergencies, right?"  rec Prednisone 10 mg take  4 each am x 2 days,   2 each am x 2 days,  1 each am x 2 days and stop  Increase the prilosec ac to where you take x 2 30-60 mn before breakfast  And continue the pepcid ac 20mg  in evening  Continue symbicort 80 (same as dulera 100 sample) Take  2 puffs first thing in am and then another 2 puffs about 12 hours later.  Only use your albuterol as a rescue medication  For cough use mucinex dm 1200 mg every 12 hours and supplement with the tramadol up to 2 every 4 hours until no cough x 3 days  Please see patient coordinator before you leave today  to schedule sinus CT    08/21/16  Restart Symbicort 2 puffs Twice daily  .  Restart Prilosec 20mg  daily before meal  Restart Pepcid 20mg  At bedtime   May use Zyrtec 10mg  At bedtime  As needed  Drainage  Zpack take as directed.  Mucinex DM Twice daily  As needed  Cough/congestion .     10/10/2016 acute extended ov/Jenny Lai re:  uacs vs asthma  maint on symb 80 2bid / gerd rx with otcs only  Chief Complaint  Patient presents with  . Acute Visit    Pt c/o cough and increased SOB x 1 wk, and progressively worse over the past 3-4 days. His cough is prod with  thick, clear sputum. He states can not lie down flat without cough.   sensation of something stuck never gone away since = oct 2016 then acutely worse sob/ cough x 1 week prior to OV  And not better p saba Does not have mucinex dm / no zyrtec  rec First take delsym two tsp every 12 hours and supplement if needed with  tramadol 50 mg up to 2 every 4 hours to suppress the urge to cough at all or even clear your throat. Swallowing water or using ice chips/non mint and menthol containing candies (such as lifesavers or sugarless jolly ranchers) are also effective.  You should rest your voice and avoid activities that you know make you cough. Once you have eliminated the cough for 3 straight days try reducing the tramadol first,  then the delsym as tolerated.   Prednisone 10 mg take  4 each am x 2 days,   2 each am x 2 days,  1 each am x 2 days and stop (this is to eliminate allergies and inflammation from coughing) Protonix (pantoprazole) Take 30-60 min before first meal of the day and Pepcid 20 mg one bedtime plus chlorpheniramine 4 mg x 2 at bedtime (both available over the counter)  until cough is completely gone for at least a week without the need for cough suppression For drainage / throat tickle try take CHLORPHENIRAMINE  4 mg - take one every 4 hours as needed - available over the counter- may cause drowsiness so start with just a bedtime dose or two and see how you tolerate it before trying in daytime   GERD diet  Stop prilosec/ mucinex  Please remember to go to the lab  department downstairs in the basement  for your tests - we will call you with the results when they are available.  Please schedule a follow up office visit in 2 weeks, sooner if needed  with all medications /inhalers/ solutions in hand so we can verify exactly what you are taking. This includes all medications from all doctors and over the counters Add did not got to lab as rec    10/17/2016  f/u ov/Kolin Erdahl re: intermittent cough x  oct 2016 def gets better on prednisone / sense of chest congestion but no excess mucus Chief Complaint  Patient presents with  . Follow-up    PFT's done today.  symptoms of daily cough  worse p supper  Did not bring meds as requested / not clear he even read them when reading them back line by line  Not using any albuterol "doesn't help"  Not able to lie down due to cough x month x no better even while on prednisone though "80% better" on pred vs off it/ no longer using cpap rec Add singulair each evening 10 daily trial basis  Please remember to go to the lab  department downstairs in the basement  for your tests - we will call you with the results when they are available. See Tammy NP in 4  weeks with all your medications, same page with your medication use she will arrange follow up with me or with allergy depending on your tests  Add : consider adding flutter if doesn't have one and HRCT looking for bronchiectasis      11/15/16 NP eval May stop  Tramadol  Use Delsym 2 tsp every 12hr for coughing .  Use Mucinex Twice daily  For congestion and mucus.  Call back with formulary options for Symbicort.  Follow med calendar closely and bring to each visit     12/12/2016  f/u ov/Joclynn Lumb re:  Chronic cough/ bronchiectasis / not convinced symb 80 helping  Chief Complaint  Patient presents with  . Follow-up    Breathing is "fine". He still c/o chest congestion. He is not coughing much but when he does it is prod with cream colored sputum.    rec Stop mucinex / delsym and symbicort and just use ventolin as needed  Work on inhaler technique:  relax and gently blow all the way out then take a nice smooth deep breath back in, triggering the inhaler at same time you start breathing in.  Hold for up to 5 seconds if you can. Blow out thru nose. Rinse and gargle with water when done Add mucinex dm 1200 mg twice daily and flutter valve as much as you can  Flonase twice daily each nostril See calendar for  specific medication instruction     01/13/2017  f/u ov/Dionisio Aragones re: chonic  cough since oct 2016 / documented bronchiectasis / not using med calendar  Chief Complaint  Patient presents with  . Follow-up    Still has some prod cough with clear sputum.   at hs takes 2 h1 and sleeps fine, no am flares but as soon as stirs experiences sense of pnds s  excess/ purulent sputum or mucus plugs  . No need for more saba off symbicort   No obvious day to day or daytime variability or assoc  chest tightness, subjective wheeze or overt sinus or hb symptoms. No unusual exp hx or h/o childhood pna/ asthma or knowledge of premature birth.  Sleeping ok without nocturnal  or early am exacerbation  of respiratory  c/o's or need for noct saba. Also denies any obvious fluctuation of symptoms with weather or environmental changes or other aggravating or alleviating factors except as outlined above   Current Medications, Allergies, Complete Past Medical History, Past Surgical History, Family History, and Social History were reviewed in [employer] record.  ROS  The following are not active complaints unless bolded sore throat, dysphagia, dental problems, itching, sneezing,  nasal congestion or excess/ purulent secretions, ear ache,   fever, chills, sweats, unintended wt loss, classically pleuritic or exertional cp, hemoptysis,  orthopnea pnd or leg swelling, presyncope, palpitations, abdominal pain, anorexia, nausea, vomiting, diarrhea  or change in bowel or bladder habits, change in stools or  urine, dysuria,hematuria,  rash, arthralgias, visual complaints, headache, numbness, weakness or ataxia or problems with walking or coordination,  change in mood/affect or memory.               Pulmonary tests CT angio chest 11/30/15 >> motion artifact, mosaic pattern, calcified granuloma SNIFF test 11/30/15 >> elevated Rt diaphragm, but has b/l diaphragm movement  Cardiac tests 10/16/15 Doppler legs b/l >>  no DVT 11/30/15 Echo >> EF 65 to 81%, grade 1 diastolic CHF  Sleep tests HST 12/21/15 >> AHI 44.5, SpO2 low 64% Auto CPAP 03/02/16 to 03/31/16 >> used on 26 of 30 nights with average 6 hrs 32 min.  Average AHI 5.5 with median CPAP 8 and 95 th percentile CPAP 11 cm H2O ONO with CPAP 04/10/16 >> test time 6 hrs 30 min.  Mean SpO2 93.51%, low SpO2 77%.  Spent 7 min with SpO2 < 88%   Past medical history Ankylosing spondylitis, Allergies, CAD, HLD, HTN, Nephrolithiasis, Malaria      Physical Exam  amb somber moderately  obese wm nad with nasal tone to voice      01/15/2017         183  12/12/2016         186  10/17/2016         183  10/10/2016         187  07/30/2016     191  07/22/16 194 lb (88 kg)  07/15/16 196 lb (88.9 kg)  07/12/16 192 lb 9.6 oz (87.4 kg)    Vital signs reviewed    - Note on arrival 02 sats  94% on RA     HEENT: nl dentition, turbinates bilaterally, and oropharynx. Nl external ear canals without cough reflex   NECK :  without JVD/Nodes/TM/ nl carotid upstrokes bilaterally   LUNGS: no acc muscle use,  Nl contour chest with very minimal insp and exp rhonchi s cough on insp or exp    CV:  RRR  no s3 or murmur or increase in P2, and no edema   ABD:  soft and nontender with nl inspiratory excursion in the supine position. No bruits or organomegaly appreciated, bowel sounds nl  MS:  Nl gait/ ext warm without deformities, calf tenderness, cyanosis  - NO  clubbing No obvious joint restrictions   SKIN: warm and dry without lesions    NEURO:  alert, approp, nl sensorium with  no motor or cerebellar deficits apparent.          Assessment/Plan

## 2017-01-13 NOTE — Telephone Encounter (Signed)
Patient contacted pre-catheterization at [facility] scheduled for: 01/14/2017 @ 1030 Verified arrival time and place:  Provided direction to front of hospital.  Verified arrival time 0830.  Confirmed AM meds to be taken pre-cath with sip of water: Pt to take am meds including ASA 81 mg in am Confirmed patient has responsible person to drive home post procedure and observe patient for 24 hours: yes Addl concerns:  none

## 2017-01-14 ENCOUNTER — Ambulatory Visit (HOSPITAL_COMMUNITY)
Admission: RE | Admit: 2017-01-14 | Discharge: 2017-01-14 | Disposition: A | Discharge status: Home / Self Care | Payer: Medicare Other | Source: Ambulatory Visit | Attending: Cardiovascular Disease | Admitting: Cardiovascular Disease

## 2017-01-14 ENCOUNTER — Encounter (HOSPITAL_COMMUNITY)
Admission: RE | Disposition: A | Discharge status: Home / Self Care | Payer: Self-pay | Source: Ambulatory Visit | Attending: Cardiovascular Disease

## 2017-01-14 DIAGNOSIS — Z825 Family history of asthma and other chronic lower respiratory diseases: Secondary | ICD-10-CM | POA: Diagnosis not present

## 2017-01-14 DIAGNOSIS — Z87442 Personal history of urinary calculi: Secondary | ICD-10-CM | POA: Insufficient documentation

## 2017-01-14 DIAGNOSIS — F419 Anxiety disorder, unspecified: Secondary | ICD-10-CM | POA: Insufficient documentation

## 2017-01-14 DIAGNOSIS — D693 Immune thrombocytopenic purpura: Secondary | ICD-10-CM | POA: Diagnosis not present

## 2017-01-14 DIAGNOSIS — Z7982 Long term (current) use of aspirin: Secondary | ICD-10-CM | POA: Insufficient documentation

## 2017-01-14 DIAGNOSIS — E785 Hyperlipidemia, unspecified: Secondary | ICD-10-CM | POA: Diagnosis not present

## 2017-01-14 DIAGNOSIS — Z86010 Personal history of colonic polyps: Secondary | ICD-10-CM | POA: Insufficient documentation

## 2017-01-14 DIAGNOSIS — Z79899 Other long term (current) drug therapy: Secondary | ICD-10-CM | POA: Insufficient documentation

## 2017-01-14 DIAGNOSIS — Z8674 Personal history of sudden cardiac arrest: Secondary | ICD-10-CM | POA: Insufficient documentation

## 2017-01-14 DIAGNOSIS — R931 Abnormal findings on diagnostic imaging of heart and coronary circulation: Secondary | ICD-10-CM

## 2017-01-14 DIAGNOSIS — I252 Old myocardial infarction: Secondary | ICD-10-CM | POA: Insufficient documentation

## 2017-01-14 DIAGNOSIS — R05 Cough: Secondary | ICD-10-CM | POA: Insufficient documentation

## 2017-01-14 DIAGNOSIS — I10 Essential (primary) hypertension: Secondary | ICD-10-CM | POA: Diagnosis not present

## 2017-01-14 DIAGNOSIS — Z8261 Family history of arthritis: Secondary | ICD-10-CM | POA: Diagnosis not present

## 2017-01-14 DIAGNOSIS — Z833 Family history of diabetes mellitus: Secondary | ICD-10-CM | POA: Insufficient documentation

## 2017-01-14 DIAGNOSIS — R0609 Other forms of dyspnea: Secondary | ICD-10-CM | POA: Diagnosis not present

## 2017-01-14 DIAGNOSIS — I2510 Atherosclerotic heart disease of native coronary artery without angina pectoris: Secondary | ICD-10-CM | POA: Diagnosis not present

## 2017-01-14 DIAGNOSIS — Z809 Family history of malignant neoplasm, unspecified: Secondary | ICD-10-CM | POA: Diagnosis not present

## 2017-01-14 HISTORY — PX: LEFT HEART CATH AND CORONARY ANGIOGRAPHY: CATH118249

## 2017-01-14 LAB — CBC
HEMATOCRIT: 43.6 % (ref 39.0–52.0)
Hemoglobin: 14.4 g/dL (ref 13.0–17.0)
MCH: 30.7 pg (ref 26.0–34.0)
MCHC: 33 g/dL (ref 30.0–36.0)
MCV: 93 fL (ref 78.0–100.0)
Platelets: 79 10*3/uL — ABNORMAL LOW (ref 150–400)
RBC: 4.69 MIL/uL (ref 4.22–5.81)
RDW: 15 % (ref 11.5–15.5)
WBC: 4.3 10*3/uL (ref 4.0–10.5)

## 2017-01-14 LAB — BASIC METABOLIC PANEL
ANION GAP: 8 (ref 5–15)
BUN: 19 mg/dL (ref 6–20)
CO2: 25 mmol/L (ref 22–32)
Calcium: 8.7 mg/dL — ABNORMAL LOW (ref 8.9–10.3)
Chloride: 106 mmol/L (ref 101–111)
Creatinine, Ser: 0.83 mg/dL (ref 0.61–1.24)
GFR calc Af Amer: >60 mL/min (ref >60)
GLUCOSE: 107 mg/dL — AB (ref 65–99)
POTASSIUM: 3.9 mmol/L (ref 3.5–5.1)
SODIUM: 139 mmol/L (ref 135–145)

## 2017-01-14 LAB — PROTIME-INR
INR: 1.05
PROTHROMBIN TIME: 13.7 s (ref 11.4–15.2)

## 2017-01-14 SURGERY — LEFT HEART CATH AND CORONARY ANGIOGRAPHY
Anesthesia: LOCAL

## 2017-01-14 MED ORDER — IOPAMIDOL (ISOVUE-370) INJECTION 76%
INTRAVENOUS | Status: AC
Start: 2017-01-14 — End: ?
  Filled 2017-01-14: qty 100

## 2017-01-14 MED ORDER — SODIUM CHLORIDE 0.9 % IV SOLN: 250.0000 mL | INTRAVENOUS | Status: DC | PRN

## 2017-01-14 MED ORDER — FENTANYL CITRATE (PF) 100 MCG/2ML IJ SOLN
INTRAMUSCULAR | Status: DC | PRN
  Administered 2017-01-14: 25 ug via INTRAVENOUS

## 2017-01-14 MED ORDER — ACETAMINOPHEN 325 MG PO TABS: 650.0000 mg | ORAL_TABLET | ORAL | Status: DC | PRN

## 2017-01-14 MED ORDER — HEPARIN SODIUM (PORCINE) 1000 UNIT/ML IJ SOLN
INTRAMUSCULAR | Status: AC
  Filled 2017-01-14: qty 1

## 2017-01-14 MED ORDER — SODIUM CHLORIDE 0.9 % WEIGHT BASED INFUSION: 1.0000 mL/kg/h | INTRAVENOUS | Status: DC

## 2017-01-14 MED ORDER — HEPARIN SODIUM (PORCINE) 1000 UNIT/ML IJ SOLN
INTRAMUSCULAR | Status: DC | PRN
  Administered 2017-01-14: 4000 [IU] via INTRAVENOUS

## 2017-01-14 MED ORDER — SODIUM CHLORIDE 0.9% FLUSH: 3.0000 mL | Freq: Two times a day (BID) | INTRAVENOUS | Status: DC

## 2017-01-14 MED ORDER — FENTANYL CITRATE (PF) 100 MCG/2ML IJ SOLN
INTRAMUSCULAR | Status: AC
  Filled 2017-01-14: qty 2

## 2017-01-14 MED ORDER — ONDANSETRON HCL 4 MG/2ML IJ SOLN: 4.0000 mg | Freq: Four times a day (QID) | INTRAMUSCULAR | Status: DC | PRN

## 2017-01-14 MED ORDER — IOPAMIDOL (ISOVUE-370) INJECTION 76%
INTRAVENOUS | Status: AC
  Filled 2017-01-14: qty 100

## 2017-01-14 MED ORDER — MIDAZOLAM HCL 2 MG/2ML IJ SOLN
INTRAMUSCULAR | Status: DC | PRN
  Administered 2017-01-14: 2 mg via INTRAVENOUS

## 2017-01-14 MED ORDER — VERAPAMIL HCL 2.5 MG/ML IV SOLN
INTRAVENOUS | Status: AC
  Filled 2017-01-14: qty 2

## 2017-01-14 MED ORDER — LIDOCAINE HCL (PF) 1 % IJ SOLN
INTRAMUSCULAR | Status: DC | PRN
  Administered 2017-01-14: 2 mL

## 2017-01-14 MED ORDER — SODIUM CHLORIDE 0.9% FLUSH
3.0000 mL | INTRAVENOUS | Status: DC | PRN
Start: 2017-01-14 — End: 2017-01-14

## 2017-01-14 MED ORDER — SODIUM CHLORIDE 0.9% FLUSH: 3.0000 mL | INTRAVENOUS | Status: DC | PRN

## 2017-01-14 MED ORDER — LIDOCAINE HCL (PF) 1 % IJ SOLN
INTRAMUSCULAR | Status: AC
  Filled 2017-01-14: qty 30

## 2017-01-14 MED ORDER — HEPARIN (PORCINE) IN NACL 2-0.9 UNIT/ML-% IJ SOLN
INTRAMUSCULAR | Status: AC
  Filled 2017-01-14: qty 500

## 2017-01-14 MED ORDER — MIDAZOLAM HCL 2 MG/2ML IJ SOLN
INTRAMUSCULAR | Status: AC
  Filled 2017-01-14: qty 2

## 2017-01-14 MED ORDER — ASPIRIN 81 MG PO CHEW: 81.0000 mg | CHEWABLE_TABLET | ORAL | Status: DC

## 2017-01-14 MED ORDER — IOPAMIDOL (ISOVUE-370) INJECTION 76%
INTRAVENOUS | Status: DC | PRN
  Administered 2017-01-14: 65 mL via INTRA_ARTERIAL

## 2017-01-14 MED ORDER — SODIUM CHLORIDE 0.9 % WEIGHT BASED INFUSION
3.0000 mL/kg/h | INTRAVENOUS | Status: AC
  Administered 2017-01-14: 3 mL/kg/h via INTRAVENOUS

## 2017-01-14 MED ORDER — VERAPAMIL HCL 2.5 MG/ML IV SOLN
INTRAVENOUS | Status: DC | PRN
  Administered 2017-01-14: 10 mL via INTRA_ARTERIAL

## 2017-01-14 MED ORDER — HEPARIN (PORCINE) IN NACL 2-0.9 UNIT/ML-% IJ SOLN
INTRAMUSCULAR | Status: DC | PRN
  Administered 2017-01-14: 12:00:00

## 2017-01-14 MED ORDER — HEPARIN (PORCINE) IN NACL 2-0.9 UNIT/ML-% IJ SOLN
INTRAMUSCULAR | Status: AC
  Filled 2017-01-14: qty 1000

## 2017-01-14 SURGICAL SUPPLY — 10 items
CATH IMPULSE 5F ANG/FL3.5 (CATHETERS) ×2 IMPLANT
CATH INFINITI 5FR AL1 (CATHETERS) ×2 IMPLANT
DEVICE RAD COMP TR BAND LRG (VASCULAR PRODUCTS) ×2 IMPLANT
GLIDESHEATH SLEND SS 6F .021 (SHEATH) ×2 IMPLANT
GUIDEWIRE INQWIRE 1.5J.035X260 (WIRE) ×1 IMPLANT
INQWIRE 1.5J .035X260CM (WIRE) ×2
KIT HEART LEFT (KITS) IMPLANT
PACK CARDIAC CATHETERIZATION (CUSTOM PROCEDURE TRAY) IMPLANT
TRANSDUCER W/STOPCOCK (MISCELLANEOUS) IMPLANT
TUBING CIL FLEX 10 FLL-RA (TUBING) IMPLANT

## 2017-01-14 NOTE — Discharge Instructions (Signed)

## 2017-01-14 NOTE — H&P (View-Only) (Signed)
Cardiology Office Note Date:  01/01/2017   ID:  [name], DOB 04/21/1941, MRN [id number]  PCP:  Adams-Doolittle, [name], MD  Cardiologist:  [name], MD    Chief Complaint  Patient presents with  . Coronary Artery Disease    follow up     History of Present Illness: [name] is a 76 y.o. male who presents for  follow-up of CAD. The patient initially presented in 2008 with an anterior wall MI complicated by out of hospital ventricular fibrillation arrest. He underwent primary PCI with stenting of the LAD (bare-metal). His left ventricular function had normalized on followup. He had a repeat heart catheterization in January 2014 after presenting with CCS class II angina and an abnormal exercise treadmill study. This demonstrated continued patency of his LAD stent with nonobstructive disease in the right coronary artery and left circumflex. There was moderate stenosis in the diagonal branch and this was thought to be responsible for his anginal symptoms and treadmill abnormality. Medical therapy was recommended. Other medical problems include hypertension, hyperlipidemia, and ITP.  The patient is here alone today. He reports fatigue and shortness of breath with physical activity. Symptoms are a little bit worse than last year. He notices this when he pushes his trashcan up a hill to his house. He denies chest pain or pressure. He has not had any orthopnea, PND, leg swelling, or heart palpitations. He's had recent blood work done showing improvement in his platelet count.  Past Medical History:  Diagnosis Date  . Abnormal chest x-ray   . Allergic rhinitis   . Ankylosing spondylitis ([place])   . Anxiety   . Arthritis   . Colonic polyp   . Coronary atherosclerosis of native coronary artery   . Cough   . Dizziness 10/21/2016  . Ganglion cyst of wrist    left  . Heart attack ([place]) 2008  . Hemorrhoids   . History of malaria   . Hyperlipidemia    with low HDL  .  Hypertension   . Kidney stone   . Other disorder of muscle, ligament, and fascia   . Rotator cuff tear   . Swelling of lower extremity 10/16/2015    Past Surgical History:  Procedure Laterality Date  . HEMORRHOID SURGERY  10/10   in [place]  . LEFT HEART CATHETERIZATION WITH CORONARY ANGIOGRAM N/A 09/03/2012   Procedure: LEFT HEART CATHETERIZATION WITH CORONARY ANGIOGRAM;  Surgeon: [name], MD;  Location: [facility] CATH LAB;  Service: Cardiovascular;  Laterality: N/A;  . ROTATOR CUFF REPAIR     bilat by Dr. French Ana    Current Outpatient Prescriptions  Medication Sig Dispense Refill  . amLODipine (NORVASC) 2.5 MG tablet Take 2.5 mg by mouth daily.    [name] Apoaequorin (PREVAGEN PO) Take 1 Dose by mouth every morning. 1 every morning     . aspirin 81 MG EC tablet Take 81 mg by mouth at bedtime.     . chlorpheniramine (CHLOR-TRIMETON) 4 MG tablet Take 4 mg by mouth every 4 (four) hours as needed for allergies.    . Dextromethorphan-Guaifenesin (MUCINEX DM PO) Take 1,200 mg by mouth 2 (two) times daily.    . famotidine (PEPCID) 20 MG tablet Take 20 mg by mouth at bedtime.    . fluticasone (FLONASE) 50 MCG/ACT nasal spray Place 1 spray into both nostrils 2 (two) times daily.    . montelukast (SINGULAIR) 10 MG tablet Take 1 tablet (10 mg total) by mouth at bedtime. 30 tablet 11  .  pantoprazole (PROTONIX) 40 MG tablet Take 1 tablet (40 mg total) by mouth daily. Take 30-60 min before first meal of the day 30 tablet 2  . Respiratory Therapy Supplies (FLUTTER) DEVI Use as directed 1 each 0  . simvastatin (ZOCOR) 20 MG tablet Take 20 mg by mouth daily with breakfast.     No current facility-administered medications for this visit.     Allergies:   Patient has no known allergies.   Social History:  The patient  reports that he has never smoked. He has never used smokeless tobacco. He reports that he does not drink alcohol or use drugs.   Family History:  The patient's  family history includes Cancer in  his mother; Diabetes in his mother; Emphysema in his mother; Rheum arthritis in his mother.    ROS:  Please see the history of present illness.  Otherwise, review of systems is positive for Chronic cough, shortness of breath with activity.  All other systems are reviewed and negative.    PHYSICAL EXAM: VS:  BP 126/70   Pulse 71   Ht 5\' 7"  (1.702 m)   Wt 183 lb 9.6 oz (83.3 kg)   BMI 28.76 kg/m  , BMI Body mass index is 28.76 kg/m. GEN: Well nourished, well developed, in no acute distress  HEENT: normal  Neck: no JVD, no masses. No carotid bruits Cardiac: RRR without murmur or gallop                Respiratory:  clear to auscultation bilaterally, normal work of breathing GI: soft, nontender, nondistended, + BS MS: no deformity or atrophy  Ext: no pretibial edema, pedal pulses 2+= bilaterally Skin: warm and dry, no rash Neuro:  Strength and sensation are intact Psych: euthymic mood, full affect  EKG:  EKG is ordered today. The ekg ordered today shows normal sinus rhythm 68 bpm, nonspecific ST abnormality  Recent Labs: 10/21/2016: ALT 19; BUN 24.0; Creatinine 1.1; HGB 16.5; Platelets 101; Potassium 4.2; Sodium 141   Lipid Panel     Component Value Date/Time   CHOL 124 (L) 12/18/2015 0831   TRIG 71 12/18/2015 0831   HDL 45 12/18/2015 0831   CHOLHDL 2.8 12/18/2015 0831   VLDL 14 12/18/2015 0831   LDLCALC 65 12/18/2015 0831      Wt Readings from Last 3 Encounters:  01/01/17 183 lb 9.6 oz (83.3 kg)  12/12/16 186 lb (84.4 kg)  11/15/16 182 lb 9.6 oz (82.8 kg)     Cardiac Studies Reviewed: Exercise Myoview 2016: QPS Raw Data Images:  Normal; no motion artifact; normal heart/lung ratio. Stress Images:  Mild focal inferior wall decreased uptake seen at both rest and stress. No ischemia identified. Otherwise, homogeneous radiotracer uptake. Rest Images:  As above. Subtraction (SDS):  No evidence of ischemia. Transient Ischemic Dilatation (Normal <1.22):  0.90 Lung/Heart  Ratio (Normal <0.45):  0.36  Quantitative Gated Spect Images QGS EDV:  88 ml QGS ESV:  42 ml  Impression Exercise Capacity:  Fair exercise capacity. 7 minutes BP Response:  Normal blood pressure response. 409 up to 811 systolic. Clinical Symptoms:  There is dyspnea. leg fatigue  ECG Impression:  Significant ST abnormalities consistent with ischemia. Comparison with Prior Nuclear Study: No images to compare  Overall Impression:  Low risk stress nuclear study with no perfusion defects suggestive of ischemia. There was no transient ischemic dilatation. Blood pressure response was normal to exercise.. EKG findings on treadmill however were markedly abnormal however in review of prior  nuclear stress test in 2004, similar EKG findings were noted. In 2008, he suffered V. fib arrest and was found to have a proximal occluded LAD which was successfully opened by Dr. Olevia Perches. There is no evidence of anterior wall or anteroseptal wall ischemia.   LV Ejection Fraction: 53%.  LV Wall Motion:  Mild hypokinesis of distal septal wall.  ASSESSMENT AND PLAN: 1.  CAD, native vessel: no angina. The patient has progressive exertional dyspnea. I am going to check an exercise Myoview stress test to evaluate him for inducible ischemia. He will continue on his current medical program. I do not appreciate any signs of volume overload or heart failure on his physical examination.  2. HTN: BP under ideal control. On amlodipine 2.5 mg daily.  3. Hyperlipidemia: treated with simvastatin. Last lipids are reviewed. Will repeat labs when he returns for his stress test. He continues on a statin drug.  Current medicines are reviewed with the patient today.  The patient does not have concerns regarding medicines.  Labs/ tests ordered today include:  No orders of the defined types were placed in this encounter.  Disposition:   FU one year unless stress test is abnormal  Signed, [name], MD  01/01/2017 4:46 PM     [facility] [address], [place], [place]  [address] Phone: [phone]; Fax: [phone]

## 2017-01-14 NOTE — Interval H&P Note (Signed)
Cath Lab Visit (complete for each Cath Lab visit)  Clinical Evaluation Leading to the Procedure:   ACS: No.  Non-ACS:    Anginal Classification: CCS II  Anti-ischemic medical therapy: Minimal Therapy (1 class of medications)  Non-Invasive Test Results: Intermediate-risk stress test findings: cardiac mortality 1-3%/year  Prior CABG: No previous CABG  History and Physical Interval Note:  01/14/2017 9:55 AM  [name]  has presented today for surgery, with the diagnosis of spb, fatigue, abnormal myoview  The various methods of treatment have been discussed with the patient and family. After consideration of risks, benefits and other options for treatment, the patient has consented to  Procedure(s): Left Heart Cath and Coronary Angiography (N/A) as a surgical intervention .  The patient's history has been reviewed, patient examined, no change in status, stable for surgery.  I have reviewed the patient's chart and labs.  Questions were answered to the patient's satisfaction.     [name]

## 2017-01-15 ENCOUNTER — Other Ambulatory Visit: Payer: Self-pay | Admitting: Cardiovascular Disease

## 2017-01-15 ENCOUNTER — Encounter (HOSPITAL_COMMUNITY): Payer: Self-pay | Admitting: Cardiovascular Disease

## 2017-01-15 ENCOUNTER — Other Ambulatory Visit: Payer: Self-pay | Admitting: Internal Medicine

## 2017-01-15 DIAGNOSIS — J45991 Cough variant asthma: Secondary | ICD-10-CM

## 2017-01-15 NOTE — Assessment & Plan Note (Signed)
HRCT chest 4/17/ 2018 >  suggestive of interstitial lung disease, with a pattern that is most compatible with nonspecific interstitial pneumonia (NSIP).   His cough is not typical at all for initial lung disease that is not reproducible with inspiration. In the absence of obvious progression of ILD we will follow this conservatively with no further workup. Should he develop progressive interstitial lung disease a full collagen vascular profile and HSP serology  are needed.

## 2017-01-15 NOTE — Assessment & Plan Note (Signed)
See CT chest 11/26/16 = Scattered areas of cylindrical bronchiectasis - flutter added 12/12/2016   His present cough is not typical of bronchiectasis although his exam certainly supports continued evidence of airways congestion typical of bronchiectasis. No change in recommendations at this point.

## 2017-01-15 NOTE — Assessment & Plan Note (Signed)
07/22/2016    > try reduce symbicort to 80 2bid  - FENO 07/15/2016  =   42 on sym 160 2bid  - 07/30/2016  After extensive coaching HFA effectiveness =    90% > continue symb 80 2bid  - Sinus CT 07/31/2016 >>> neg - FENO 10/10/2016  =   12 during flare while on symb 80 2bid - PFT's  10/17/2016  FEV1 2.40 (89 % ) ratio 87  p 0 % improvement from saba p symb 80 x 2 prior to study with DLCO  60/58 % corrects to 90 % for alv volume and erv =50%   - Allergy profile 10/17/2016 >  Eos 0.0 /  IgE 55 Rast pos dust only   - 10/17/2016  Added singulair 10 mg  - HRCT chest 11/2016  -NSIP Changes/Bronchiectasis noted  - 12/12/2016 spirometry s obst with active symptoms >> trial off symbicort as not convinced helping  He is clearly no worse off of Symbicort and the next step will be to try to eliminate the postnasal drip syndrome with Dymista on a trial basis.  I had an extended discussion with the patient reviewing all relevant studies completed to date and  lasting 15 to 20 minutes of a 25 minute visit    Each maintenance medication was reviewed in detail including most importantly the difference between maintenance and prns and under what circumstances the prns are to be triggered using an action plan format that is not reflected in the computer generated alphabetically organized AVS but trather by a customized med calendar that reflects the AVS meds with confirmed 100% correlation.   In addition, Please see AVS for unique instructions that I personally wrote and verbalized to the the pt in detail and then reviewed with pt  by my nurse highlighting any  changes in therapy recommended at today's visit to their plan of care.

## 2017-01-20 ENCOUNTER — Other Ambulatory Visit: Payer: Self-pay | Admitting: Cardiovascular Disease

## 2017-01-20 ENCOUNTER — Ambulatory Visit: Payer: Medicare Other | Admitting: Cardiovascular Disease

## 2017-02-14 ENCOUNTER — Ambulatory Visit (INDEPENDENT_AMBULATORY_CARE_PROVIDER_SITE_OTHER): Payer: Medicare Other | Admitting: Adult Health

## 2017-02-14 ENCOUNTER — Encounter: Payer: Self-pay | Admitting: Adult Health

## 2017-02-14 DIAGNOSIS — J849 Interstitial pulmonary disease, unspecified: Secondary | ICD-10-CM

## 2017-02-14 DIAGNOSIS — J45991 Cough variant asthma: Secondary | ICD-10-CM

## 2017-02-14 NOTE — Progress Notes (Signed)
Chart and office note reviewed in detail  > agree with a/p as outlined    

## 2017-02-14 NOTE — Assessment & Plan Note (Signed)
Improved control on preventive regimen  Patient's medications were reviewed today and patient education was given. Computerized medication calendar was adjusted/completed   Plan  Patient Instructions  Follow med calendar closely and bring to each visit .  Try not to clear throat , use sips of water .  Follow up Dr. Melvyn Novas  In  3-4 months and As needed

## 2017-02-14 NOTE — Assessment & Plan Note (Signed)
Mild ILD changes noted with mild bronchiectasis  Continue to monitor .  Consider repeat PFT at 1 year and CXR .

## 2017-02-14 NOTE — Progress Notes (Signed)
@Patient  ID: [name], male    DOB: 1940-10-16, 76 y.o.   MRN: [id number]  Chief Complaint  Patient presents with  . Follow-up    Asthma     Referring provider: Adams-Doolittle, Benjam*  HPI: 76 yo male never smoker seen for initial pulmonary consult for cough 05/2015 felt to have cough variant asthma with AR .    TEST [facility] :  CTA Chest 11/2015 neg for PE , motion artifact.  Echo 11/2015 EF 65-70%, Gr 1 DD  07/22/2016 >try reduce symbicort to 80 2bid  - FENO 07/15/2016 = 42 on sym 160 2bid  - 07/30/2016 After extensive coaching HFA effectiveness = 90% >continue symb 80 2bid  - Sinus CT 07/31/2016 >>> neg -chronic elevation of right hemidiaphragm but neg sniff test 11/2015  -CBC 06/2016 eos neg  08/2016 Spirometry today shows FEV1 81%, ratio 85%, FVC 69%.   FENO 10/10/2016  =   12 during flare while on symb 80 2bid - PFT's  10/17/2016  FEV1 2.40 (89 % ) ratio 87  p 0 % improvement from saba p symb 80 x 2 prior to study with DLCO  60/58 % corrects to 90 % for alv volume and erv =50%   - Allergy profile 10/17/2016 >  Eos 0.0 /  IgE 55 Rast pos dust only   - 10/17/2016  Added singulair 10 mg  HRCT chest 12/06/17 >, patchy areas of groundglass attenuation and septal thickening scattered throughout the lungs bilaterally, most evident in the periphery. Some peripheral bronchiectasis. And scattered areas of cylindrical bronchiectasis.   02/14/2017 Follow up : Cough variant asthma  Patient returns for a one-month follow-up and medication review. Patient says overall breathing is improving. Cough is decreased quite a bit since last visit. Still has some dry cough and frequent throat clearing. He feels cough is a lot better. Taking mucinex DM Twice daily  , Using flutter valve Four times a day  And using dymista . Feels this is helping .   We reviewed all his medications organize them into a medication count with patient education. It appears he is taking his medications  correctly.      Allergies  Allergen Reactions  . Tramadol     Immunization History  Administered Date(s) Administered  . Influenza Split 06/01/2012  . Influenza Whole 05/30/2009, 04/20/2010  . Influenza-Unspecified 07/12/2013, 05/10/2016  . Pneumococcal Polysaccharide-23 08/22/2009  . Tdap 12/04/2010, 06/04/2011  . Zoster 05/17/2008    Past Medical History:  Diagnosis Date  . Abnormal chest x-ray   . Allergic rhinitis   . Ankylosing spondylitis ([place])   . Anxiety   . Arthritis   . Colonic polyp   . Coronary atherosclerosis of native coronary artery   . Cough   . Dizziness 10/21/2016  . Ganglion cyst of wrist    left  . Heart attack ([place]) 2008  . Hemorrhoids   . History of malaria   . Hyperlipidemia    with low HDL  . Hypertension   . Kidney stone   . Other disorder of muscle, ligament, and fascia   . Rotator cuff tear   . Swelling of lower extremity 10/16/2015    Tobacco History: History  Smoking Status  . Never Smoker  Smokeless Tobacco  . Never Used   Counseling given: Not Answered   Outpatient Encounter Prescriptions as of 02/14/2017  Medication Sig  . amLODipine (NORVASC) 2.5 MG tablet TAKE 1 TABLET EVERY DAY  . Apoaequorin (PREVAGEN PO) Take 1  tablet by mouth daily.   [name] aspirin 81 MG EC tablet Take 81 mg by mouth daily.   . Azelastine-Fluticasone (DYMISTA) 137-50 MCG/ACT SUSP Place 1 spray into the nose 2 (two) times daily.  . chlorpheniramine (CHLOR-TRIMETON) 4 MG tablet Take 8 mg by mouth at bedtime.   [name] Dextromethorphan-Guaifenesin (MUCINEX DM MAXIMUM STRENGTH) 60-1200 MG TB12 Take 1 tablet by mouth 2 (two) times daily.  . famotidine (PEPCID) 20 MG tablet Take 20 mg by mouth at bedtime.  . fluticasone (FLONASE) 50 MCG/ACT nasal spray Place 1 spray into both nostrils 2 (two) times daily.  . montelukast (SINGULAIR) 10 MG tablet Take 1 tablet (10 mg total) by mouth at bedtime.  . Multiple Vitamins-Minerals (PRESERVISION AREDS) TABS Take 1 tablet by  mouth daily.  . pantoprazole (PROTONIX) 40 MG tablet TAKE 1 TABLET BY MOUTH ONCE DAILY ,  30  TO  60  MINUTES  BEFORE  FIRST  MEAL  OF  THE  DAY  . Respiratory Therapy Supplies (FLUTTER) DEVI Use as directed  . simvastatin (ZOCOR) 20 MG tablet TAKE 1 TABLET EVERY DAY WITH BREAKFAST (NEED TO SCHEDULE YEARLY APPT W/DR COOPER FOR MAY BEFORE ANY REFILLS [phone])   No facility-administered encounter medications on file as of 02/14/2017.      Review of Systems  Constitutional:   No  weight loss, night sweats,  Fevers, chills, fatigue, or  lassitude.  HEENT:   No headaches,  Difficulty swallowing,  Tooth/dental problems, or  Sore throat,                No sneezing, itching, ear ache + nasal congestion, post nasal drip,   CV:  No chest pain,  Orthopnea, PND, swelling in lower extremities, anasarca, dizziness, palpitations, syncope.   GI  No heartburn, indigestion, abdominal pain, nausea, vomiting, diarrhea, change in bowel habits, loss of appetite, bloody stools.   Resp: No shortness of breath with exertion or at rest.     No wheezing.  No chest wall deformity  Skin: no rash or lesions.  GU: no dysuria, change in color of urine, no urgency or frequency.  No flank pain, no hematuria   MS:  No joint pain or swelling.  No decreased range of motion.  No back pain.    Physical Exam  BP 120/68 (BP Location: Left Arm, Cuff Size: Normal)   Pulse (!) 58   Ht 5\' 7"  (1.702 m)   Wt 187 lb 12.8 oz (85.2 kg)   SpO2 97%   BMI 29.41 kg/m   GEN: A/Ox3; pleasant , NAD, well nourished    HEENT:  [place]/AT,  EACs-clear, TMs-wnl, NOSE-clear, THROAT-clear, no lesions, no postnasal drip or exudate noted.   NECK:  Supple w/ fair ROM; no JVD; normal carotid impulses w/o bruits; no thyromegaly or nodules palpated; no lymphadenopathy.    RESP  Clear  P & A; w/o, wheezes/ rales/ or rhonchi. no accessory muscle use, no dullness to percussion  CARD:  RRR, no m/r/g, no peripheral edema, pulses intact, no  cyanosis or clubbing.  GI:   Soft & nt; nml bowel sounds; no organomegaly or masses detected.   Musco: Warm bil, no deformities or joint swelling noted.   Neuro: alert, no focal deficits noted.    Skin: Warm, no lesions or rashes    Lab Results:  CBC   BNP  Imaging: No results found.   Assessment & Plan:   Cough variant asthma  vs UACS Improved control on preventive  regimen  Patient's medications were reviewed today and patient education was given. Computerized medication calendar was adjusted/completed   Plan  Patient Instructions  Follow med calendar closely and bring to each visit .  Try not to clear throat , use sips of water .  Follow up Dr. Melvyn Novas  In  3-4 months and As needed       ILD (interstitial lung disease) ([place]) Mild ILD changes noted with mild bronchiectasis  Continue to monitor .  Consider repeat PFT at 1 year and CXR .      Rexene Edison, NP 02/14/2017

## 2017-02-14 NOTE — Patient Instructions (Signed)
Follow med calendar closely and bring to each visit .  Try not to clear throat , use sips of water .  Follow up Dr. Melvyn Novas  In  3-4 months and As needed

## 2017-02-16 ENCOUNTER — Encounter: Payer: Self-pay | Admitting: Cardiovascular Disease

## 2017-02-26 DIAGNOSIS — Z1211 Encounter for screening for malignant neoplasm of colon: Secondary | ICD-10-CM | POA: Diagnosis not present

## 2017-02-26 DIAGNOSIS — K625 Hemorrhage of anus and rectum: Secondary | ICD-10-CM | POA: Diagnosis not present

## 2017-03-11 DIAGNOSIS — K635 Polyp of colon: Secondary | ICD-10-CM | POA: Diagnosis not present

## 2017-03-11 DIAGNOSIS — K648 Other hemorrhoids: Secondary | ICD-10-CM | POA: Diagnosis not present

## 2017-03-11 DIAGNOSIS — Z1211 Encounter for screening for malignant neoplasm of colon: Secondary | ICD-10-CM | POA: Diagnosis not present

## 2017-03-11 DIAGNOSIS — Z01818 Encounter for other preprocedural examination: Secondary | ICD-10-CM | POA: Diagnosis not present

## 2017-03-16 DIAGNOSIS — K635 Polyp of colon: Secondary | ICD-10-CM | POA: Diagnosis not present

## 2017-03-24 DIAGNOSIS — K625 Hemorrhage of anus and rectum: Secondary | ICD-10-CM | POA: Diagnosis not present

## 2017-03-24 DIAGNOSIS — Z86010 Personal history of colonic polyps: Secondary | ICD-10-CM | POA: Diagnosis not present

## 2017-04-21 ENCOUNTER — Encounter: Payer: Self-pay | Admitting: Internal Medicine

## 2017-04-21 ENCOUNTER — Other Ambulatory Visit (HOSPITAL_BASED_OUTPATIENT_CLINIC_OR_DEPARTMENT_OTHER): Payer: Medicare Other

## 2017-04-21 ENCOUNTER — Ambulatory Visit (HOSPITAL_BASED_OUTPATIENT_CLINIC_OR_DEPARTMENT_OTHER): Payer: Medicare Other | Admitting: Internal Medicine

## 2017-04-21 ENCOUNTER — Telehealth: Payer: Self-pay | Admitting: Internal Medicine

## 2017-04-21 VITALS — BP 144/74 | HR 62 | Temp 98.5°F | Resp 18 | Ht 67.0 in | Wt 191.0 lb

## 2017-04-21 DIAGNOSIS — D693 Immune thrombocytopenic purpura: Secondary | ICD-10-CM

## 2017-04-21 DIAGNOSIS — D696 Thrombocytopenia, unspecified: Secondary | ICD-10-CM

## 2017-04-21 DIAGNOSIS — J209 Acute bronchitis, unspecified: Secondary | ICD-10-CM

## 2017-04-21 DIAGNOSIS — R42 Dizziness and giddiness: Secondary | ICD-10-CM

## 2017-04-21 LAB — CBC WITH DIFFERENTIAL/PLATELET
BASO%: 0.2 % (ref 0.0–2.0)
Basophils Absolute: 0 10*3/uL (ref 0.0–0.1)
EOS%: 0.2 % (ref 0.0–7.0)
Eosinophils Absolute: 0 10*3/uL (ref 0.0–0.5)
HEMATOCRIT: 45.4 % (ref 38.4–49.9)
HGB: 15.5 g/dL (ref 13.0–17.1)
LYMPH%: 31.4 % (ref 14.0–49.0)
MCH: 31.1 pg (ref 27.2–33.4)
MCHC: 34.1 g/dL (ref 32.0–36.0)
MCV: 91.2 fL (ref 79.3–98.0)
MONO#: 0.6 10*3/uL (ref 0.1–0.9)
MONO%: 13.6 % (ref 0.0–14.0)
NEUT#: 2.5 10*3/uL (ref 1.5–6.5)
NEUT%: 54.6 % (ref 39.0–75.0)
Platelets: 79 10*3/uL — ABNORMAL LOW (ref 140–400)
RBC: 4.98 10*6/uL (ref 4.20–5.82)
RDW: 14.6 % (ref 11.0–14.6)
WBC: 4.6 10*3/uL (ref 4.0–10.3)
lymph#: 1.4 10*3/uL (ref 0.9–3.3)
nRBC: 0 % (ref 0–0)

## 2017-04-21 LAB — COMPREHENSIVE METABOLIC PANEL
ALK PHOS: 61 U/L (ref 40–150)
ALT: 36 U/L (ref 0–55)
AST: 22 U/L (ref 5–34)
Albumin: 4.2 g/dL (ref 3.5–5.0)
Anion Gap: 10 mEq/L (ref 3–11)
BUN: 20.2 mg/dL (ref 7.0–26.0)
CALCIUM: 9.6 mg/dL (ref 8.4–10.4)
CHLORIDE: 107 meq/L (ref 98–109)
CO2: 25 mEq/L (ref 22–29)
Creatinine: 0.9 mg/dL (ref 0.7–1.3)
EGFR: 85 mL/min/{1.73_m2} — ABNORMAL LOW (ref >90)
Glucose: 126 mg/dl (ref 70–140)
POTASSIUM: 4 meq/L (ref 3.5–5.1)
SODIUM: 141 meq/L (ref 136–145)
Total Bilirubin: 0.99 mg/dL (ref 0.20–1.20)
Total Protein: 7.1 g/dL (ref 6.4–8.3)

## 2017-04-21 LAB — LACTATE DEHYDROGENASE: LDH: 118 U/L — AB (ref 125–245)

## 2017-04-21 NOTE — Progress Notes (Signed)
[facility] [phone]   [phone]  OFFICE PROGRESS NOTE  Adams-Doolittle, [name], MD [place] [address]  DIAGNOSIS: Thrombocytopenia, most likely idiopathic thrombocytopenic purpura.  PRIOR THERAPY:None.  CURRENT THERAPY: observation.  INTERVAL HISTORY: [name] 76 y.o. male returns to the clinic today for follow-up visit. The patient is feeling fine with no specific complaints. She denied having any chest pain, shortness breath, cough or hemoptysis. He denied having any weight loss or night sweats. The patient denied having any bleeding issues. He denied having any fever or chills. He has no nausea, vomiting, diarrhea or constipation. He is here today for evaluation and repeat blood work.  MEDICAL HISTORY: Past Medical History:  Diagnosis Date  . Abnormal chest x-ray   . Allergic rhinitis   . Ankylosing spondylitis ([place])   . Anxiety   . Arthritis   . Colonic polyp   . Coronary atherosclerosis of native coronary artery   . Cough   . Dizziness 10/21/2016  . Ganglion cyst of wrist    left  . Heart attack ([place]) 2008  . Hemorrhoids   . History of malaria   . Hyperlipidemia    with low HDL  . Hypertension   . Kidney stone   . Other disorder of muscle, ligament, and fascia   . Rotator cuff tear   . Swelling of lower extremity 10/16/2015    ALLERGIES:  is allergic to tramadol.  MEDICATIONS:  Current Outpatient Prescriptions  Medication Sig Dispense Refill  . albuterol (PROVENTIL HFA;VENTOLIN HFA) 108 (90 Base) MCG/ACT inhaler Inhale 2 puffs into the lungs every 4 (four) hours as needed for wheezing or shortness of breath.    [name] amLODipine (NORVASC) 2.5 MG tablet TAKE 1 TABLET EVERY DAY 90 tablet 3  . Apoaequorin (PREVAGEN PO) Take 1 tablet by mouth daily.     [name] aspirin 81 MG EC tablet Take 81 mg by mouth daily.     . Azelastine-Fluticasone (DYMISTA) 137-50 MCG/ACT SUSP Place 1 spray into the nose 2 (two)  times daily. 1 Bottle 11  . chlorpheniramine (CHLOR-TRIMETON) 4 MG tablet Take 8 mg by mouth at bedtime.     [name] Dextromethorphan-Guaifenesin (MUCINEX DM MAXIMUM STRENGTH) 60-1200 MG TB12 Take 1 tablet by mouth 2 (two) times daily.    . famotidine (PEPCID) 20 MG tablet Take 20 mg by mouth at bedtime.    . fluticasone (FLONASE) 50 MCG/ACT nasal spray Place 1 spray into both nostrils 2 (two) times daily.    . montelukast (SINGULAIR) 10 MG tablet Take 1 tablet (10 mg total) by mouth at bedtime. 30 tablet 11  . Multiple Vitamins-Minerals (PRESERVISION AREDS) TABS Take 1 tablet by mouth daily.    . pantoprazole (PROTONIX) 40 MG tablet TAKE 1 TABLET BY MOUTH ONCE DAILY ,  30  TO  60  MINUTES  BEFORE  FIRST  MEAL  OF  THE  DAY 30 tablet 2  . Respiratory Therapy Supplies (FLUTTER) DEVI Use as directed 1 each 0  . simvastatin (ZOCOR) 20 MG tablet TAKE 1 TABLET EVERY DAY WITH BREAKFAST (NEED TO SCHEDULE YEARLY APPT W/DR COOPER FOR MAY BEFORE ANY REFILLS [phone]) 90 tablet 3   No current facility-administered medications for this visit.     SURGICAL HISTORY:  Past Surgical History:  Procedure Laterality Date  . HEMORRHOID SURGERY  10/10   in [place]  . LEFT HEART CATH AND CORONARY ANGIOGRAPHY N/A 01/14/2017   Procedure: Left  Heart Cath and Coronary Angiography;  Surgeon: [name], MD;  Location: [facility];  Service: Cardiovascular;  Laterality: N/A;  . LEFT HEART CATHETERIZATION WITH CORONARY ANGIOGRAM N/A 09/03/2012   Procedure: LEFT HEART CATHETERIZATION WITH CORONARY ANGIOGRAM;  Surgeon: [name], MD;  Location: [facility];  Service: Cardiovascular;  Laterality: N/A;  . ROTATOR CUFF REPAIR     bilat by Dr. French Ana    REVIEW OF SYSTEMS:  A comprehensive review of systems was negative.   PHYSICAL EXAMINATION: General appearance: alert, cooperative and no distress Head: Normocephalic, without obvious abnormality, atraumatic Neck: no adenopathy Lymph nodes: Cervical,  supraclavicular, and axillary nodes normal. Resp: clear to auscultation bilaterally Back: symmetric, no curvature. ROM normal. No CVA tenderness. Cardio: regular rate and rhythm, S1, S2 normal, no murmur, click, rub or gallop GI: soft, non-tender; bowel sounds normal; no masses,  no organomegaly Extremities: extremities normal, atraumatic, no cyanosis or edema  ECOG PERFORMANCE STATUS: 1 - Symptomatic but completely ambulatory  Blood pressure (!) 144/74, pulse 62, temperature 98.5 F (36.9 C), temperature source Oral, resp. rate 18, height 5\' 7"  (1.702 m), weight 191 lb (86.6 kg), SpO2 96 %.  LABORATORY DATA: Lab Results  Component Value Date   WBC 4.3 01/14/2017   HGB 14.4 01/14/2017   HCT 43.6 01/14/2017   MCV 93.0 01/14/2017   PLT 79 (L) 01/14/2017      Chemistry      Component Value Date/Time   NA 139 01/14/2017 0850   NA 141 10/21/2016 0802   K 3.9 01/14/2017 0850   K 4.2 10/21/2016 0802   CL 106 01/14/2017 0850   CL 106 09/30/2012 0949   CO2 25 01/14/2017 0850   CO2 26 10/21/2016 0802   BUN 19 01/14/2017 0850   BUN 24.0 10/21/2016 0802   CREATININE 0.83 01/14/2017 0850   CREATININE 1.1 10/21/2016 0802      Component Value Date/Time   CALCIUM 8.7 (L) 01/14/2017 0850   CALCIUM 9.7 10/21/2016 0802   ALKPHOS 78 10/21/2016 0802   AST 13 10/21/2016 0802   ALT 19 10/21/2016 0802   BILITOT 1.08 10/21/2016 0802     BONE MARROW REPORT FINAL DIAGNOSIS RADIOGRAPHIC STUDIES: No results found.  ASSESSMENT AND PLAN:  This is a very pleasant 76 years old white male with idiopathic thrombocytopenic purpura who is currently on observation. His CBC today showed platelets count of 79,000 but the patient is asymptomatic. I discussed the lab results with the patient today. I recommended for him to continue on observation for now. He would come back for follow-up visit in 6 months for reevaluation with repeat blood work. The patient was advised to call immediately if he has any  concerning symptoms in the interval. I advised the patient to call me immediately if he has any significant bleeding issues, bruises or ecchymosis or if his platelets count to be less than 50,000. All questions were answered. The patient knows to call the clinic with any problems, questions or concerns. We can certainly see the patient much sooner if necessary.  Disclaimer: This note was dictated with voice recognition software. Similar sounding words can inadvertently be transcribed and may not be corrected upon review.

## 2017-04-21 NOTE — Telephone Encounter (Signed)
Gave avs and calendar for march °

## 2017-04-27 ENCOUNTER — Other Ambulatory Visit: Payer: Self-pay | Admitting: Internal Medicine

## 2017-04-27 DIAGNOSIS — J45991 Cough variant asthma: Secondary | ICD-10-CM

## 2017-04-28 DIAGNOSIS — K648 Other hemorrhoids: Secondary | ICD-10-CM | POA: Diagnosis not present

## 2017-04-28 DIAGNOSIS — K625 Hemorrhage of anus and rectum: Secondary | ICD-10-CM | POA: Diagnosis not present

## 2017-04-28 DIAGNOSIS — Z01818 Encounter for other preprocedural examination: Secondary | ICD-10-CM | POA: Diagnosis not present

## 2017-05-14 ENCOUNTER — Telehealth: Payer: Self-pay | Admitting: Internal Medicine

## 2017-05-14 DIAGNOSIS — Z86010 Personal history of colonic polyps: Secondary | ICD-10-CM | POA: Diagnosis not present

## 2017-05-14 DIAGNOSIS — K6289 Other specified diseases of anus and rectum: Secondary | ICD-10-CM | POA: Diagnosis not present

## 2017-05-14 NOTE — Telephone Encounter (Signed)
Called and spoke to pt. Pt requesting a refill for Protonix. Advised pt that this was already filled on 04/28/17 with 2 refills. Called pharmacy and spoke with Katrina and was advised that the rx is on file and can be filled for pt. Called and informed pt. Pt verbalized understanding and denied any further questions or concerns at this time.

## 2017-05-19 ENCOUNTER — Ambulatory Visit (HOSPITAL_COMMUNITY)
Admission: RE | Admit: 2017-05-19 | Discharge: 2017-05-19 | Disposition: A | Discharge status: Home / Self Care | Payer: Medicare Other | Source: Ambulatory Visit | Attending: Family Medicine | Admitting: Family Medicine

## 2017-05-19 ENCOUNTER — Encounter: Payer: Self-pay | Admitting: Internal Medicine

## 2017-05-19 ENCOUNTER — Ambulatory Visit (INDEPENDENT_AMBULATORY_CARE_PROVIDER_SITE_OTHER): Payer: Medicare Other | Admitting: Family Medicine

## 2017-05-19 ENCOUNTER — Ambulatory Visit (INDEPENDENT_AMBULATORY_CARE_PROVIDER_SITE_OTHER): Payer: Medicare Other

## 2017-05-19 ENCOUNTER — Encounter: Payer: Self-pay | Admitting: Family Medicine

## 2017-05-19 ENCOUNTER — Ambulatory Visit (INDEPENDENT_AMBULATORY_CARE_PROVIDER_SITE_OTHER): Payer: Medicare Other | Admitting: Internal Medicine

## 2017-05-19 VITALS — BP 126/68 | HR 63 | Ht 67.0 in | Wt 190.8 lb

## 2017-05-19 VITALS — BP 118/62 | HR 62 | Temp 98.5°F | Resp 16 | Ht 67.0 in | Wt 191.4 lb

## 2017-05-19 DIAGNOSIS — K760 Fatty (change of) liver, not elsewhere classified: Secondary | ICD-10-CM | POA: Insufficient documentation

## 2017-05-19 DIAGNOSIS — J849 Interstitial pulmonary disease, unspecified: Secondary | ICD-10-CM | POA: Diagnosis not present

## 2017-05-19 DIAGNOSIS — R81 Glycosuria: Secondary | ICD-10-CM

## 2017-05-19 DIAGNOSIS — M545 Low back pain, unspecified: Secondary | ICD-10-CM

## 2017-05-19 DIAGNOSIS — Z23 Encounter for immunization: Secondary | ICD-10-CM

## 2017-05-19 DIAGNOSIS — I2510 Atherosclerotic heart disease of native coronary artery without angina pectoris: Secondary | ICD-10-CM

## 2017-05-19 DIAGNOSIS — M457 Ankylosing spondylitis of lumbosacral region: Secondary | ICD-10-CM

## 2017-05-19 DIAGNOSIS — J479 Bronchiectasis, uncomplicated: Secondary | ICD-10-CM | POA: Diagnosis not present

## 2017-05-19 DIAGNOSIS — R1030 Lower abdominal pain, unspecified: Secondary | ICD-10-CM

## 2017-05-19 DIAGNOSIS — M47817 Spondylosis without myelopathy or radiculopathy, lumbosacral region: Secondary | ICD-10-CM | POA: Insufficient documentation

## 2017-05-19 DIAGNOSIS — N400 Enlarged prostate without lower urinary tract symptoms: Secondary | ICD-10-CM | POA: Diagnosis not present

## 2017-05-19 DIAGNOSIS — J45991 Cough variant asthma: Secondary | ICD-10-CM | POA: Diagnosis not present

## 2017-05-19 LAB — POCT CBC
GRANULOCYTE PERCENT: 58.7 % (ref 37–80)
HCT, POC: 45.7 % (ref 43.5–53.7)
Hemoglobin: 15.8 g/dL (ref 14.1–18.1)
LYMPH, POC: 2.6 (ref 0.6–3.4)
MCH, POC: 30.7 pg (ref 27–31.2)
MCHC: 34.6 g/dL (ref 31.8–35.4)
MCV: 88.7 fL (ref 80–97)
MID (CBC): 0.8 (ref 0–0.9)
MPV: 9.3 fL (ref 0–99.8)
PLATELET COUNT, POC: 97 10*3/uL — AB (ref 142–424)
POC Granulocyte: 4.9 (ref 2–6.9)
POC LYMPH %: 31.8 % (ref 10–50)
POC MID %: 9.5 %M (ref 0–12)
RBC: 5.15 M/uL (ref 4.69–6.13)
RDW, POC: 14.6 %
WBC: 8.3 10*3/uL (ref 4.6–10.2)

## 2017-05-19 LAB — POCT URINALYSIS DIP (MANUAL ENTRY)
Bilirubin, UA: NEGATIVE
Blood, UA: NEGATIVE
Ketones, POC UA: NEGATIVE mg/dL
LEUKOCYTES UA: NEGATIVE
Nitrite, UA: NEGATIVE
Protein Ur, POC: NEGATIVE mg/dL
SPEC GRAV UA: 1.025 (ref 1.010–1.025)
UROBILINOGEN UA: 0.2 U/dL
pH, UA: 5.5 (ref 5.0–8.0)

## 2017-05-19 LAB — POC MICROSCOPIC URINALYSIS (UMFC): MUCUS RE: ABSENT

## 2017-05-19 LAB — GLUCOSE, POCT (MANUAL RESULT ENTRY): POC Glucose: 71 mg/dl (ref 70–99)

## 2017-05-19 MED ORDER — AZELASTINE-FLUTICASONE 137-50 MCG/ACT NA SUSP: 1.0000 | Freq: Two times a day (BID) | NASAL | 11 refills | Status: DC

## 2017-05-19 MED ORDER — IOPAMIDOL (ISOVUE-300) INJECTION 61%
INTRAVENOUS | Status: AC
  Filled 2017-05-19: qty 100

## 2017-05-19 MED ORDER — IOPAMIDOL (ISOVUE-300) INJECTION 61%
100.0000 mL | Freq: Once | INTRAVENOUS | Status: AC | PRN
  Administered 2017-05-19: 100 mL via INTRAVENOUS

## 2017-05-19 MED ORDER — HYDROCODONE-ACETAMINOPHEN 5-325 MG PO TABS: 1.0000 | ORAL_TABLET | Freq: Four times a day (QID) | ORAL | 0 refills | Status: DC | PRN

## 2017-05-19 MED ORDER — AZELASTINE-FLUTICASONE 137-50 MCG/ACT NA SUSP: 1.0000 | Freq: Two times a day (BID) | NASAL | 0 refills | Status: DC

## 2017-05-19 NOTE — Progress Notes (Addendum)
Subjective:  By signing my name below, I, Moises Blood, attest that this documentation has been prepared under the direction and in the presence of Merri Ray, MD. Electronically Signed: Moises Blood, [place]. 05/19/2017 , 11:25 AM .  Patient was seen in Room 10 .   Patient ID: [name], male    DOB: 1940-11-04, 76 y.o.   MRN: [id number] Chief Complaint  Patient presents with  . Back Pain    x 2 weeks,    HPI [name] is a 76 y.o. male  Here with back pain. He is a previous patient of Dr. Perfecto Kingdom. He has a history of multiple medical problems including ankylosing spondylitis.   Patient states his back pain has gotten worse over the past 2 weeks. His back pain with ankylosing spondylitis usually causes pain in the morning and would lasts about 5-10 minutes before it resolves. But right now, his pain would stay persistent continuously though unchanged in pain scale. He does mention the pain is wrapping around to the front into his abdomen bilaterally, which is also new in the past 2 weeks. He's been taking tylenol 3 tablets twice a day with temporary relief, but with the wrong movement, his pain would flare up again. He's tried using a TENS unit but without resolve. He's also tried applying a heat pad over the area with some relief. He also mentions having some pain into his legs. He denies any changes with activity. He denies any known injury. His mobility has decreased with the back pain when he leans forward and bending forward.   He's taken Celebrex in the past. He's also taken Tramadol in the past, but caused nausea and dizziness. He has seen Dr. Estanislado Pandy in the past for this, but hasn't returned. He denies nausea, vomiting, diarrhea, or blood in stool. He recently had hemorrhoids operation about 3 weeks ago, done by Dr. Ferdinand Lango at [facility] and will have another one in Nov. He denies chronic stomach issues. He denies urinary symptoms, fever, chills, urinary/bowel  incontinence, or saddle anesthesia. He denies history of hernia.   Thrombocytopenia He has a history of thrombocytopenia, followed by Dr. [name].  Lab Results  Component Value Date   WBC 4.6 04/21/2017   HGB 15.5 04/21/2017   HCT 45.4 04/21/2017   MCV 91.2 04/21/2017   PLT 79 Few Large & Occ giant platelets (L) 04/21/2017   Glycosuria Lab Results  Component Value Date   HGBA1C 5.5 07/12/2016   Glycosuria noted on office urine testing. Slight hyperglycemia in the past. He had glucose of 126 4 weeks ago.   [12:05PM] He reports feeling sweaty and shaky. BP was 138/70 at this time. Improved with water and crackers/snack.   Patient Active Problem List   Diagnosis Date Noted  . Abnormal nuclear cardiac imaging test 01/14/2017  . Bronchiectasis without complication ([place]) 63/78/5885  . ILD (interstitial lung disease) ([place]) 11/27/2016  . Dizziness 10/21/2016  . Morbid obesity due to excess calories ([place]) 07/24/2016  . Cough variant asthma  vs UACS 07/22/2016  . Obstructive sleep apnea 12/01/2015  . Acute bronchitis 11/30/2015  . Swelling of lower extremity 10/16/2015  . Benign mole 02/04/2014  . Impaired glucose tolerance 10/26/2013  . NAFLD (nonalcoholic fatty liver disease) 04/27/2013  . Thrombocytopenia ([place]) 10/21/2012  . Exertional angina ([facility]) 09/03/2012  . MALARIA 12/05/2009  . COLONIC POLYPS 12/05/2009  . HYPERCHOLESTEROLEMIA 12/05/2009  . ANXIETY 12/05/2009  . HEMORRHOIDS 12/05/2009  . Allergic rhinitis 12/05/2009  . ANKYLOSING  SPONDYLITIS 12/05/2009  . GANGLION CYST, WRIST, LEFT 12/05/2009  . ROTATOR CUFF TEAR 12/05/2009  . OTHER DISORDER OF MUSCLE LIGAMENT AND FASCIA 12/05/2009  . ABNORMAL CHEST XRAY 12/05/2009  . CAD, NATIVE VESSEL 04/05/2009   Past Medical History:  Diagnosis Date  . Abnormal chest x-ray   . Allergic rhinitis   . Ankylosing spondylitis ([place])   . Anxiety   . Arthritis   . Colonic polyp   . Coronary atherosclerosis of native coronary  artery   . Cough   . Dizziness 10/21/2016  . Ganglion cyst of wrist    left  . Heart attack ([place]) 2008  . Hemorrhoids   . History of malaria   . Hyperlipidemia    with low HDL  . Hypertension   . Kidney stone   . Other disorder of muscle, ligament, and fascia   . Rotator cuff tear   . Swelling of lower extremity 10/16/2015   Past Surgical History:  Procedure Laterality Date  . HEMORRHOID SURGERY  10/10   in [place]  . LEFT HEART CATH AND CORONARY ANGIOGRAPHY N/A 01/14/2017   Procedure: Left Heart Cath and Coronary Angiography;  Surgeon: [name], MD;  Location: [facility];  Service: Cardiovascular;  Laterality: N/A;  . LEFT HEART CATHETERIZATION WITH CORONARY ANGIOGRAM N/A 09/03/2012   Procedure: LEFT HEART CATHETERIZATION WITH CORONARY ANGIOGRAM;  Surgeon: [name], MD;  Location: [facility] CATH [facility];  Service: Cardiovascular;  Laterality: N/A;  . ROTATOR CUFF REPAIR     bilat by Dr. French Ana   Allergies  Allergen Reactions  . Tramadol    Prior to Admission medications   Medication Sig Start Date End Date Taking? Authorizing Provider  albuterol (PROVENTIL HFA;VENTOLIN HFA) 108 (90 Base) MCG/ACT inhaler Inhale 2 puffs into the lungs every 4 (four) hours as needed for wheezing or shortness of breath.    [provider]  amLODipine (NORVASC) 2.5 MG tablet TAKE 1 TABLET EVERY DAY 01/21/17   [name], MD  Apoaequorin (PREVAGEN PO) Take 1 tablet by mouth daily.     [provider]  aspirin 81 MG EC tablet Take 81 mg by mouth daily.     [provider]  Azelastine-Fluticasone (DYMISTA) 137-50 MCG/ACT SUSP Place 1 spray into the nose 2 (two) times daily. 05/19/17   [name], MD  chlorpheniramine (CHLOR-TRIMETON) 4 MG tablet Take 8 mg by mouth at bedtime.     [provider]  Dextromethorphan-Guaifenesin (MUCINEX DM MAXIMUM STRENGTH) 60-1200 MG TB12 Take 1 tablet by mouth 2 (two) times daily.    [provider]  famotidine  (PEPCID) 20 MG tablet Take 20 mg by mouth at bedtime.    [provider]  montelukast (SINGULAIR) 10 MG tablet Take 1 tablet (10 mg total) by mouth at bedtime. 10/17/16   [name], MD  Multiple Vitamins-Minerals (PRESERVISION AREDS) TABS Take 1 tablet by mouth daily.    [provider]  pantoprazole (PROTONIX) 40 MG tablet TAKE 1 TABLET BY MOUTH ONCE DAILY 30  TO  60  MINUTES  BEFORE  FIRST  MEAL  OF  THE  DAY 04/28/17   [name], MD  Respiratory Therapy Supplies (FLUTTER) DEVI Use as directed 12/12/16   [name], MD  simvastatin (ZOCOR) 20 MG tablet TAKE 1 TABLET EVERY DAY WITH BREAKFAST (NEED TO SCHEDULE YEARLY APPT W/DR COOPER FOR MAY BEFORE ANY REFILLS [phone]) 01/15/17   [name], MD   Social History  Social History  . Marital status: Married    Spouse name: [name]  . Number of children: 2  . Years of education: N/A   Occupational History  . Not on file.   Social History Main Topics  . Smoking status: Never Smoker  . Smokeless tobacco: Never Used  . Alcohol use No     Comment: occ 1 beer, wine occ  . Drug use: No  . Sexual activity: Yes   Other Topics Concern  . Not on file   Social History Narrative  . No narrative on file   Review of Systems  Constitutional: Negative for fatigue and unexpected weight change.  Eyes: Negative for visual disturbance.  Respiratory: Negative for cough, chest tightness and shortness of breath.   Cardiovascular: Negative for chest pain, palpitations and leg swelling.  Gastrointestinal: Negative for abdominal pain and blood in stool.  Musculoskeletal: Positive for back pain.  Skin: Negative for rash and wound.  Neurological: Negative for dizziness, weakness, light-headedness, numbness and headaches.       Objective:   Physical Exam  Constitutional: He is oriented to person, place, and time. He appears well-developed and well-nourished.  HENT:  Head: Normocephalic and atraumatic.  Eyes:  Pupils are equal, round, and reactive to light. EOM are normal.  Neck: No JVD present. Carotid bruit is not present.  Cardiovascular: Normal rate, regular rhythm and normal heart sounds.   No murmur heard. Pulmonary/Chest: Effort normal and breath sounds normal. He has no rales.  Abdominal: There is tenderness in the suprapubic area. There is guarding and tenderness at McBurney's point. No hernia.  Suprapubic tenderness bilaterally including over McBurney's point with some guarding; no inguinal or umbilical hernia appreciated  Musculoskeletal: He exhibits no edema.  Back: no focal tenderness, describes pain over lower lumbosacral area, no rash, no bruising, skin intact; guarded ROM flexion to about 80 degrees, minimal lateral flexion; negative straight leg raise bilaterally  Neurological: He is alert and oriented to person, place, and time.  Reflex Scores:      Patellar reflexes are 2+ on the right side and 2+ on the left side.      Achilles reflexes are 2+ on the right side and 2+ on the left side. Skin: Skin is warm and dry.  Psychiatric: He has a normal mood and affect.  Vitals reviewed.   Vitals:   05/19/17 1049  BP: 118/62  Pulse: 62  Resp: 16  Temp: 98.5 F (36.9 C)  SpO2: 96%  Weight: 191 lb 6.4 oz (86.8 kg)  Height: 5\' 7"  (1.702 m)   Results for orders placed or performed in visit on 05/19/17  POCT urinalysis dipstick  Result Value Ref Range   Color, UA yellow yellow   Clarity, UA clear clear   Glucose, UA =100 (A) negative mg/dL   Bilirubin, UA negative negative   Ketones, POC UA negative negative mg/dL   Spec Grav, UA 1.025 1.010 - 1.025   Blood, UA negative negative   pH, UA 5.5 5.0 - 8.0   Protein Ur, POC negative negative mg/dL   Urobilinogen, UA 0.2 0.2 or 1.0 E.U./dL   Nitrite, UA Negative Negative   Leukocytes, UA Negative Negative  POCT Microscopic Urinalysis ([facility])  Result Value Ref Range   WBC,UR,HPF,POC None None WBC/hpf   RBC,UR,HPF,POC None None  RBC/hpf   Bacteria Few (A) None, Too numerous to count   Mucus Absent Absent   Epithelial Cells, UR Per Microscopy None None, Too numerous to count cells/hpf  POCT CBC  Result Value Ref Range   WBC 8.3 4.6 - 10.2 K/uL   Lymph, poc 2.6 0.6 - 3.4   POC LYMPH PERCENT 31.8 10 - 50 %L   MID (cbc) 0.8 0 - 0.9   POC MID % 9.5 0 - 12 %M   POC Granulocyte 4.9 2 - 6.9   Granulocyte percent 58.7 37 - 80 %G   RBC 5.15 4.69 - 6.13 M/uL   Hemoglobin 15.8 14.1 - 18.1 g/dL   HCT, POC 45.7 43.5 - 53.7 %   MCV 88.7 80 - 97 fL   MCH, POC 30.7 27 - 31.2 pg   MCHC 34.6 31.8 - 35.4 g/dL   RDW, POC 14.6 %   Platelet Count, POC 97 (A) 142 - 424 K/uL   MPV 9.3 0 - 99.8 fL  POCT glucose (manual entry)  Result Value Ref Range   POC Glucose 71 70 - 99 mg/dl   Dg Lumbar Spine Complete  Result Date: 05/19/2017 CLINICAL DATA:  Low back pain EXAM: LUMBAR SPINE - COMPLETE 4+ VIEW COMPARISON:  None. FINDINGS: Degenerative spurring throughout the lumbar spine. Degenerative disc disease from L4-5 through L5-S1. No fracture or malalignment. Aortic and iliac calcifications. No visible aneurysm. SI joints are symmetric and unremarkable. IMPRESSION: Degenerative spurring. Degenerative facet disease. No acute findings. Electronically Signed   By: [name] M.D.   On: 05/19/2017 11:49   Ct Abdomen Pelvis W Contrast  Result Date: 05/19/2017 CLINICAL DATA:  Low back pain and lower abdominal pain for 2 weeks. EXAM: CT ABDOMEN AND PELVIS WITH CONTRAST TECHNIQUE: Multidetector CT imaging of the abdomen and pelvis was performed using the standard protocol following bolus administration of intravenous contrast. CONTRAST:  173mL ISOVUE-300 IOPAMIDOL (ISOVUE-300) INJECTION 61% COMPARISON:  None available. FINDINGS: Lower chest: Heavy calcific atherosclerotic disease of the coronary arteries. Normal heart size. No pericardial effusion. No focal airspace consolidation. Hepatobiliary: No gallstones, gallbladder wall thickening, or  biliary dilatation. Hepatic steatosis. Pancreas: Unremarkable. No pancreatic ductal dilatation or surrounding inflammatory changes. Spleen: Normal in size without focal abnormality. Adrenals/Urinary Tract: Adrenal glands are unremarkable. Kidneys are without renal calculi, suspicious focal lesion, or hydronephrosis. Two right renal cysts, the larger measuring 2.1 cm. Mild circumferential mucosal thickening of the urinary bladder. Stomach/Bowel: Stomach is within normal limits. Appendix appears normal. No evidence of bowel wall thickening, distention, or inflammatory changes. Vascular/Lymphatic: No significant vascular findings are present. No enlarged abdominal or pelvic lymph nodes. Reproductive: Enlarged and heterogeneous prostate gland. Other: No abdominal wall hernia or abnormality. No abdominopelvic ascites. Musculoskeletal: No fracture is seen. Findings consistent with diffuse idiopathic skeletal hyperostosis of the thoracic spine. Multilevel osteoarthritic changes of the lumbosacral spine with prominent facet arthropathy in the lower lumbosacral spine. IMPRESSION: 1. Hepatic steatosis. 2. Enlarged heterogeneous prostate gland. Please correlate to serum PSA values. Mild diffuse mucosal thickening of the urinary bladder may be due to chronic outlet obstruction. 3.  Multilevel osteoarthritic changes of the lumbosacral spine. Electronically Signed   By: Fidela Salisbury M.D.   On: 05/19/2017 15:31        Assessment & Plan:   [name] is a 76 y.o. male Low back pain, unspecified back pain laterality, unspecified chronicity, with sciatica presence unspecified - Plan: DG Lumbar Spine Complete Bilateral low back pain without sciatica, unspecified chronicity - Plan: HYDROcodone-acetaminophen (NORCO/VICODIN) 5-325 MG tablet Ankylosing spondylitis of lumbosacral region [facility]) - Plan: DG Lumbar Spine Complete  - History of ankylosing spondylitis, but possible disc/nerve impingement with  multiple level  osteoarthritic changes, as well as radiation of symptoms to abdomen.  - X-ray without acute findings, CT was obtained for abdominal symptoms as below. Afebrile and reassuring CBC, less likely infection  -Tylenol as needed for mild pain, hydrocodone provided if more severe pain. Recheck in next 5 days to determine if advanced imaging needed, sooner or to emergency room if worsening symptoms.  Lower abdominal pain - Plan: POCT urinalysis dipstick, POCT Microscopic Urinalysis ([facility]), POCT CBC, CT Abdomen Pelvis W Contrast, CANCELED: CT Abdomen Pelvis W Contrast  -Afebrile, reassuring CBC as above, urinalysis without concerning findings. CT abdomen pelvis obtained. Results as above. Hepatic steatosis, enlarged prostate gland with mild thickening of urinary bladder, possible chronic outlet obstruction. This could contribute to some of his lower abdominal symptoms. Will call with results and check status.  Glycosuria - Plan: POCT glucose (manual entry)  -Glycosuria noted, but glucose in office was normal.     Meds ordered this encounter  Medications  . HYDROcodone-acetaminophen (NORCO/VICODIN) 5-325 MG tablet    Sig: Take 1 tablet by mouth every 6 (six) hours as needed for moderate pain.    Dispense:  15 tablet    Refill:  0   Patient Instructions   Hydrocodone if needed for back pain. Heat or ice, gentle range of motion, Tylenol if needed for milder pain. Recheck back pain in 5 days.  Depending on results of CT scan for abdominal pain, we do have the option of trying prednisone to see if that will help her pain. We can also discuss whether rheumatology referral is needed at that next visit.  For abdominal pain, I'll obtain a CT scan today to look into infection or other cause.  Return to the clinic or go to the nearest emergency room if any of your symptoms worsen or new symptoms occur.  Please go straight to [place] into admissions and tell them you are there for a CT  scan.  Someone will come and get you.  Please stay after your exam for further instructions.    Abdominal Pain, Adult Abdominal pain can be caused by many things. Often, abdominal pain is not serious and it gets better with no treatment or by being treated at home. However, sometimes abdominal pain is serious. Your health care provider will do a medical history and a physical exam to try to determine the cause of your abdominal pain. Follow these instructions at home:  Take over-the-counter and prescription medicines only as told by your health care provider. Do not take a laxative unless told by your health care provider.  Drink enough fluid to keep your urine clear or pale yellow.  Watch your condition for any changes.  Keep all follow-up visits as told by your health care provider. This is important. Contact a health care provider if:  Your abdominal pain changes or gets worse.  You are not hungry or you lose weight without trying.  You are constipated or have diarrhea for more than 2-3 days.  You have pain when you urinate or have a bowel movement.  Your abdominal pain wakes you up at night.  Your pain gets worse with meals, after eating, or with certain foods.  You are throwing up and cannot keep anything down.  You have a fever. Get help right away if:  Your pain does not go away as soon as your health care provider told you to expect.  You cannot stop throwing up.  Your pain is  only in areas of the abdomen, such as the right side or the left lower portion of the abdomen.  You have bloody or black stools, or stools that look like tar.  You have severe pain, cramping, or bloating in your abdomen.  You have signs of dehydration, such as: ? Dark urine, very little urine, or no urine. ? Cracked lips. ? Dry mouth. ? Sunken eyes. ? Sleepiness. ? Weakness. This information is not intended to replace advice given to you by your health care provider. Make sure you  discuss any questions you have with your health care provider. Document Released: 05/08/2005 Document Revised: 02/16/2016 Document Reviewed: 01/10/2016 Elsevier Interactive Patient Education  2017 [employer] Inc.    Back Pain, Adult Back pain is very common in adults.The cause of back pain is rarely dangerous and the pain often gets better over time.The cause of your back pain may not be known. Some common causes of back pain include:  Strain of the muscles or ligaments supporting the spine.  Wear and tear (degeneration) of the spinal disks.  Arthritis.  Direct injury to the back.  For many people, back pain may return. Since back pain is rarely dangerous, most people can learn to manage this condition on their own. Follow these instructions at home: Watch your back pain for any changes. The following actions may help to lessen any discomfort you are feeling:  Remain active. It is stressful on your back to sit or stand in one place for long periods of time. Do not sit, drive, or stand in one place for more than 30 minutes at a time. Take short walks on even surfaces as soon as you are able.Try to increase the length of time you walk each day.  Exercise regularly as directed by your health care provider. Exercise helps your back heal faster. It also helps avoid future injury by keeping your muscles strong and flexible.  Do not stay in bed.Resting more than 1-2 days can delay your recovery.  Pay attention to your body when you bend and lift. The most comfortable positions are those that put less stress on your recovering back. Always use proper lifting techniques, including: ? Bending your knees. ? Keeping the load close to your body. ? Avoiding twisting.  Find a comfortable position to sleep. Use a firm mattress and lie on your side with your knees slightly bent. If you lie on your back, put a pillow under your knees.  Avoid feeling anxious or stressed.Stress increases muscle  tension and can worsen back pain.It is important to recognize when you are anxious or stressed and learn ways to manage it, such as with exercise.  Take medicines only as directed by your health care provider. Over-the-counter medicines to reduce pain and inflammation are often the most helpful.Your health care provider may prescribe muscle relaxant drugs.These medicines help dull your pain so you can more quickly return to your normal activities and healthy exercise.  Apply ice to the injured area: ? Put ice in a plastic bag. ? Place a towel between your skin and the bag. ? Leave the ice on for 20 minutes, 2-3 times a day for the first 2-3 days. After that, ice and heat may be alternated to reduce pain and spasms.  Maintain a healthy weight. Excess weight puts extra stress on your back and makes it difficult to maintain good posture.  Contact a health care provider if:  You have pain that is not relieved with rest or medicine.  You have increasing pain going down into the legs or buttocks.  You have pain that does not improve in one week.  You have night pain.  You lose weight.  You have a fever or chills. Get help right away if:  You develop new bowel or bladder control problems.  You have unusual weakness or numbness in your arms or legs.  You develop nausea or vomiting.  You develop abdominal pain.  You feel faint. This information is not intended to replace advice given to you by your health care provider. Make sure you discuss any questions you have with your health care provider. Document Released: 07/29/2005 Document Revised: 12/07/2015 Document Reviewed: 11/30/2013 Elsevier Interactive Patient Education  2017 [employer].     IF you received an x-ray today, you will receive an invoice from [facility]. Please contact [facility] at [phone] with questions or concerns regarding your invoice.   IF you received labwork today, you will  receive an invoice from [facility]. Please contact [facility] at [phone] with questions or concerns regarding your invoice.   Our billing staff will not be able to assist you with questions regarding bills from these companies.  You will be contacted with the lab results as soon as they are available. The fastest way to get your results is to activate your My Chart account. Instructions are located on the last page of this paperwork. If you have not heard from [place] regarding the results in 2 weeks, please contact this office.       I personally performed the services described in this documentation, which was scribed in my presence. The recorded information has been reviewed and considered for accuracy and completeness, addended by me as needed, and agree with information above.  Signed,   Merri Ray, MD [facility].  05/21/17 10:37 PM

## 2017-05-19 NOTE — Progress Notes (Signed)
Brief patient profile:  89 yowm never smoker with ankylosing spondilitis  with new onset variable  cough since Oct 2016 comes and goes s background of asthma but with background of nasal allergies for which took shots and moved residence in 1980s  >  No further problems except nasal drip /[place]nee[place] then fine [place] stopped going there.    History of Present Illness  Congestion in chest x Oct 2016 worse in evenings with mostly dry coughing and much worse x sev weeks > [facility]ood eval on 07/15/16 cough for a week/ albterol and pred no better  rec Advair one puff twice per day until sample completed >> call if you feel you need a refill after this Zithromax 250 mg pill >> 2 pills on day 1, then 1 pill daily for next four days Albuterol 2 puffs every 4 to 6 hours as needed for cough, wheeze, or chest congestion Finish course of prednisone    07/22/2016 acute extended ov/Jolly Bleicher re:  Refractory cough > sob on advair and pred taper and getting worse Chief Complaint  Patient presents with  . Acute Visit    Increased SOB and cough are not improving since acute ov here 07/15/16. His cough has been occ prod with light colored sputum.  He states that his breathing does okay in the am's and then gets worse in the evenings.   on advair dpi/ benzoate and took pred 10 mg one tablet  This am and cough seems ok when he wakes up and progressively worse with Minimal actual sputum production as the day goes on and then does not really bother him while sleeping.. Shaking on details of care/ names of meds/ no better with saba  rec Take delsym two tsp every 12 hours and supplement if needed with  tramadol 50 mg up to 1 every 4 hours  .   Symbicort 80 Take 2 puffs first thing in am and then another 2 puffs about 12 hours later - if helping refill it Only use your albuterol (ventolin) inhaler  As long as coughing at all:  Try prilosec otc 20mg   Take 30-60 min before first meal of the day and  Pepcid ac (famotidine) 20 mg one @  bedtime until cough is completely gone for at least a week without the need for cough suppression GERD diet    07/30/2016 acute extended ov/Jayesh Marbach re: refractory cough since late Nov 2017  Chief Complaint  Patient presents with  . Acute Visit    Pt c/o increased cough and congestion for the past 2 wks. He states symptoms get worse in the evening. Cough is prod in the am's with thick, clear sputum.  He states he can not lie down flat due to cough and cough sometimes waked him up. He also c/o wheezing.   brought all his meds, not using symb 2bid per count, only missing 11 tramadol "it's for pain, right? "  Some better while on pred, worse since finished it/ only taking prilosec otc 20 mg qam / is taking pepcid 20 mg hs  Not using any saba "it's for emergencies, right?"  rec Prednisone 10 mg take  4 each am x 2 days,   2 each am x 2 days,  1 each am x 2 days and stop  Increase the prilosec ac to where you take x 2 30-60 mn before breakfast  And continue the pepcid ac 20mg  in evening  Continue symbicort 80 (same as  dulera 100 sample) Take 2 puffs first thing in am and then another 2 puffs about 12 hours later.  Only use your albuterol as a rescue medication  For cough use mucinex dm 1200 mg every 12 hours and supplement with the tramadol up to 2 every 4 hours until no cough x 3 days  Please see patient coordinator before you leave today  to schedule sinus CT    08/21/16  Restart Symbicort 2 puffs Twice daily  .  Restart Prilosec 20mg  daily before meal  Restart Pepcid 20mg  At bedtime   May use Zyrtec 10mg  At bedtime  As needed  Drainage  Zpack take as directed.  Mucinex DM Twice daily  As needed  Cough/congestion .     10/10/2016 acute extended ov/Safina Huard re:  uacs vs asthma  maint on symb 80 2bid / gerd rx with otcs only  Chief Complaint  Patient presents with  . Acute Visit    Pt c/o cough and increased SOB x 1 wk, and progressively worse over the past 3-4  days. His cough is prod with thick, clear sputum. He states can not lie down flat without cough.   sensation of something stuck never gone away since = oct 2016 then acutely worse sob/ cough x 1 week prior to OV  And not better p saba Does not have mucinex dm / no zyrtec  rec First take delsym two tsp every 12 hours and supplement if needed with  tramadol 50 mg up to 2 every 4 hours to suppress the urge to cough at all or even clear your throat. Swallowing water or using ice chips/non mint and menthol containing candies (such as lifesavers or sugarless jolly ranchers) are also effective.  You should rest your voice and avoid activities that you know make you cough. Once you have eliminated the cough for 3 straight days try reducing the tramadol first,  then the delsym as tolerated.   Prednisone 10 mg take  4 each am x 2 days,   2 each am x 2 days,  1 each am x 2 days and stop (this is to eliminate allergies and inflammation from coughing) Protonix (pantoprazole) Take 30-60 min before first meal of the day and Pepcid 20 mg one bedtime plus chlorpheniramine 4 mg x 2 at bedtime (both available over the counter)  until cough is completely gone for at least a week without the need for cough suppression For drainage / throat tickle try take CHLORPHENIRAMINE  4 mg - take one every 4 hours as needed - available over the counter- may cause drowsiness so start with just a bedtime dose or two and see how you tolerate it before trying in daytime   GERD diet  Stop prilosec/ mucinex  Please remember to go to the lab  department downstairs in the basement  for your tests - we will call you with the results when they are available.  Please schedule a follow up office visit in 2 weeks, sooner if needed  with all medications /inhalers/ solutions in hand so we can verify exactly what you are taking. This includes all medications from all doctors and over the counters Add did not got to lab as rec    10/17/2016  f/u  ov/Katora Fini re: intermittent cough x oct 2016 def gets better on prednisone / sense of chest congestion but no excess mucus Chief Complaint  Patient presents with  . Follow-up    PFT's done today.  symptoms of daily cough  worse p supper Did not bring meds as requested / not clear he even read them when reading them back line by line  Not using any albuterol "doesn't help"  Not able to lie down due to cough x month x no better even while on prednisone though "80% better" on pred vs off it/ no longer using cpap rec Add singulair each evening 10 daily trial basis  Please remember to go to the lab  department downstairs in the basement  for your tests - we will call you with the results when they are available. See Tammy NP in 4  weeks with all your medications, same page with your medication use she will arrange follow up with me or with allergy depending on your tests  Add : consider adding flutter if doesn't have one and HRCT looking for bronchiectasis      11/15/16 NP eval May stop  Tramadol  Use Delsym 2 tsp every 12hr for coughing .  Use Mucinex Twice daily  For congestion and mucus.  Call back with formulary options for Symbicort.  Follow med calendar closely and bring to each visit     12/12/2016  f/u ov/Dempsey Knotek re:  Chronic cough/ bronchiectasis / not convinced symb 80 helping  Chief Complaint  Patient presents with  . Follow-up    Breathing is "fine". He still c/o chest congestion. He is not coughing much but when he does it is prod with cream colored sputum.    rec Stop mucinex / delsym and symbicort and just use ventolin as needed  Work on inhaler technique:  relax and gently blow all the way out then take a nice smooth deep breath back in, triggering the inhaler at same time you start breathing in.  Hold for up to 5 seconds if you can. Blow out thru nose. Rinse and gargle with water when done Add mucinex dm 1200 mg twice daily and flutter valve as much as you can  Flonase twice  daily each nostril See calendar for specific medication instruction     01/13/2017  f/u ov/Shalay Carder re: chonic  cough since oct 2016 / documented bronchiectasis / not using med calendar  Chief Complaint  Patient presents with  . Follow-up    Still has some prod cough with clear sputum.   at hs takes 2 h1 and sleeps fine, no am flares but as soon as stirs experiences sense of pnds s  excess/ purulent sputum or mucus plugs  . No need for more saba off symbicort  rec Try dymista   05/19/2017  f/u ov/Zamani Crocker re: chronic cough 2016  Chief Complaint  Patient presents with  . Follow-up    Pt states that he has been doing good. Denies any cough, CP, or SOB.  Not limited by breathing from desired activities  / more by back  Not using dymista now and cut the H1 down to one has (ad libbing on maint rx but really doing better than he has x  2 years! No need for saba   No obvious day to day or daytime variability or assoc excess/ purulent sputum or mucus plugs or hemoptysis or cp or chest tightness, subjective wheeze or overt sinus or hb symptoms. No unusual exp hx or h/o childhood pna/ asthma or knowledge of premature birth.  Sleeping ok flat without nocturnal  or early am exacerbation  of respiratory  c/o's or need for noct saba. Also denies any obvious fluctuation of symptoms with weather or environmental changes or  other aggravating or alleviating factors except as outlined above   Current Allergies, Complete Past Medical History, Past Surgical History, Family History, and Social History were reviewed in [employer] record.  ROS  The following are not active complaints unless bolded Hoarseness, sore throat, dysphagia, dental problems, itching, sneezing,  nasal congestion or discharge of excess mucus or purulent secretions, ear ache,   fever, chills, sweats, unintended wt loss or wt gain, classically pleuritic or exertional cp,  orthopnea pnd or leg swelling, presyncope,  palpitations, abdominal pain, anorexia, nausea, vomiting, diarrhea  or change in bowel habits or change in bladder habits, change in stools or change in urine, dysuria, hematuria,  rash, arthralgias, visual complaints, headache, numbness, weakness or ataxia or problems with walking or coordination,  change in mood/affect or memory.        Current Meds  Medication Sig  . amLODipine (NORVASC) 2.5 MG tablet TAKE 1 TABLET EVERY DAY  . Apoaequorin (PREVAGEN PO) Take 1 tablet by mouth daily.   [name] aspirin 81 MG EC tablet Take 81 mg by mouth daily.   . Azelastine-Fluticasone (DYMISTA) 137-50 MCG/ACT SUSP Place 1 spray into the nose 2 (two) times daily.  . chlorpheniramine (CHLOR-TRIMETON) 4 MG tablet Take 8 mg by mouth at bedtime.   . famotidine (PEPCID) 20 MG tablet Take 20 mg by mouth at bedtime.  . montelukast (SINGULAIR) 10 MG tablet Take 1 tablet (10 mg total) by mouth at bedtime.  . Multiple Vitamins-Minerals (PRESERVISION AREDS) TABS Take 1 tablet by mouth daily.  . pantoprazole (PROTONIX) 40 MG tablet TAKE 1 TABLET BY MOUTH ONCE DAILY 30  TO  60  MINUTES  BEFORE  FIRST  MEAL  OF  THE  DAY  . Respiratory Therapy Supplies (FLUTTER) DEVI Use as directed  . simvastatin (ZOCOR) 20 MG tablet TAKE 1 TABLET EVERY DAY WITH BREAKFAST (NEED TO SCHEDULE YEARLY APPT W/DR COOPER FOR MAY BEFORE ANY REFILLS [phone])  . [  (PROVENTIL HFA;VENTOLIN HFA) 108 (90 Base) MCG/ACT inhaler Inhale 2 puffs into the lungs every 4 (four) hours as needed for wheezing or shortness of breath.  .   Azelastine-Fluticasone (DYMISTA) 137-50 MCG/ACT SUSP Place 1 spray into the nose 2 (two) times daily.  . [  Dextromethorphan-Guaifenesin (MUCINEX DM MAXIMUM STRENGTH) 60-1200 MG TB12 Take 1 tablet by mouth 2 (two) times daily.                   Pulmonary tests CT angio chest 11/30/15 >> motion artifact, mosaic pattern, calcified granuloma SNIFF test 11/30/15 >> elevated Rt diaphragm, but has b/l diaphragm  movement  Cardiac tests 10/16/15 Doppler legs b/l >> no DVT 11/30/15 Echo >> EF 65 to 09%, grade 1 diastolic CHF  Sleep tests HST 12/21/15 >> AHI 44.5, SpO2 low 64% Auto CPAP 03/02/16 to 03/31/16 >> used on 26 of 30 nights with average 6 hrs 32 min.  Average AHI 5.5 with median CPAP 8 and 95 th percentile CPAP 11 cm H2O ONO with CPAP 04/10/16 >> test time 6 hrs 30 min.  Mean SpO2 93.51%, low SpO2 77%.  Spent 7 min with SpO2 < 88%   Past medical history Ankylosing spondylitis, Allergies, CAD, HLD, HTN, Nephrolithiasis, Malaria      Physical Exam  amb somber moderately  obese wm nad       05/20/2017       190  01/15/2017         183  12/12/2016  186  10/17/2016         183  10/10/2016         187  07/30/2016     191  07/22/16 194 lb (88 kg)  07/15/16 196 lb (88.9 kg)  07/12/16 192 lb 9.6 oz (87.4 kg)    Vital signs reviewed    - Note on arrival 02 sats  96% on RA     HEENT: nl dentition,  and oropharynx. Nl external ear canals without cough reflex - mild  bilateral non-specific turbinate edema     NECK :  without JVD/Nodes/TM/ nl carotid upstrokes bilaterally   LUNGS: no acc muscle use,  Nl contour chest  clear to A and P    CV:  RRR  no s3 or murmur or increase in P2, and no edema   ABD:  soft and nontender with nl inspiratory excursion in the supine position. No bruits or organomegaly appreciated, bowel sounds nl  MS:  Nl gait/ ext warm without deformities, calf tenderness, cyanosis  - NO  clubbing No obvious joint restrictions   SKIN: warm and dry without lesions    NEURO:  alert, approp, nl sensorium with  no motor or cerebellar deficits apparent.          Assessment/Plan

## 2017-05-19 NOTE — Patient Instructions (Addendum)
Hydrocodone if needed for back pain. Heat or ice, gentle range of motion, Tylenol if needed for milder pain. Recheck back pain in 5 days.  Depending on results of CT scan for abdominal pain, we do have the option of trying prednisone to see if that will help her pain. We can also discuss whether rheumatology referral is needed at that next visit.  For abdominal pain, I'll obtain a CT scan today to look into infection or other cause.  Return to the clinic or go to the nearest emergency room if any of your symptoms worsen or new symptoms occur.  Please go straight to [facility] into admissions and tell them you are there for a CT scan.  Someone will come and get you.  Please stay after your exam for further instructions.    Abdominal Pain, Adult Abdominal pain can be caused by many things. Often, abdominal pain is not serious and it gets better with no treatment or by being treated at home. However, sometimes abdominal pain is serious. Your health care provider will do a medical history and a physical exam to try to determine the cause of your abdominal pain. Follow these instructions at home:  Take over-the-counter and prescription medicines only as told by your health care provider. Do not take a laxative unless told by your health care provider.  Drink enough fluid to keep your urine clear or pale yellow.  Watch your condition for any changes.  Keep all follow-up visits as told by your health care provider. This is important. Contact a health care provider if:  Your abdominal pain changes or gets worse.  You are not hungry or you lose weight without trying.  You are constipated or have diarrhea for more than 2-3 days.  You have pain when you urinate or have a bowel movement.  Your abdominal pain wakes you up at night.  Your pain gets worse with meals, after eating, or with certain foods.  You are throwing up and cannot keep anything down.  You have a fever. Get  help right away if:  Your pain does not go away as soon as your health care provider told you to expect.  You cannot stop throwing up.  Your pain is only in areas of the abdomen, such as the right side or the left lower portion of the abdomen.  You have bloody or black stools, or stools that look like tar.  You have severe pain, cramping, or bloating in your abdomen.  You have signs of dehydration, such as: ? Dark urine, very little urine, or no urine. ? Cracked lips. ? Dry mouth. ? Sunken eyes. ? Sleepiness. ? Weakness. This information is not intended to replace advice given to you by your health care provider. Make sure you discuss any questions you have with your health care provider. Document Released: 05/08/2005 Document Revised: 02/16/2016 Document Reviewed: 01/10/2016 Elsevier Interactive Patient Education  2017 Elsevier Inc.    Back Pain, Adult Back pain is very common in adults.The cause of back pain is rarely dangerous and the pain often gets better over time.The cause of your back pain may not be known. Some common causes of back pain include:  Strain of the muscles or ligaments supporting the spine.  Wear and tear (degeneration) of the spinal disks.  Arthritis.  Direct injury to the back.  For many people, back pain may return. Since back pain is rarely dangerous, most people can learn to manage this condition  on their own. Follow these instructions at home: Watch your back pain for any changes. The following actions may help to lessen any discomfort you are feeling:  Remain active. It is stressful on your back to sit or stand in one place for long periods of time. Do not sit, drive, or stand in one place for more than 30 minutes at a time. Take short walks on even surfaces as soon as you are able.Try to increase the length of time you walk each day.  Exercise regularly as directed by your health care provider. Exercise helps your back heal faster. It also  helps avoid future injury by keeping your muscles strong and flexible.  Do not stay in bed.Resting more than 1-2 days can delay your recovery.  Pay attention to your body when you bend and lift. The most comfortable positions are those that put less stress on your recovering back. Always use proper lifting techniques, including: ? Bending your knees. ? Keeping the load close to your body. ? Avoiding twisting.  Find a comfortable position to sleep. Use a firm mattress and lie on your side with your knees slightly bent. If you lie on your back, put a pillow under your knees.  Avoid feeling anxious or stressed.Stress increases muscle tension and can worsen back pain.It is important to recognize when you are anxious or stressed and learn ways to manage it, such as with exercise.  Take medicines only as directed by your health care provider. Over-the-counter medicines to reduce pain and inflammation are often the most helpful.Your health care provider may prescribe muscle relaxant drugs.These medicines help dull your pain so you can more quickly return to your normal activities and healthy exercise.  Apply ice to the injured area: ? Put ice in a plastic bag. ? Place a towel between your skin and the bag. ? Leave the ice on for 20 minutes, 2-3 times a day for the first 2-3 days. After that, ice and heat may be alternated to reduce pain and spasms.  Maintain a healthy weight. Excess weight puts extra stress on your back and makes it difficult to maintain good posture.  Contact a health care provider if:  You have pain that is not relieved with rest or medicine.  You have increasing pain going down into the legs or buttocks.  You have pain that does not improve in one week.  You have night pain.  You lose weight.  You have a fever or chills. Get help right away if:  You develop new bowel or bladder control problems.  You have unusual weakness or numbness in your arms or  legs.  You develop nausea or vomiting.  You develop abdominal pain.  You feel faint. This information is not intended to replace advice given to you by your health care provider. Make sure you discuss any questions you have with your health care provider. Document Released: 07/29/2005 Document Revised: 12/07/2015 Document Reviewed: 11/30/2013 Elsevier Interactive Patient Education  2017 [employer].     IF you received an x-ray today, you will receive an invoice from [facility]. Please contact [facility] at [phone] with questions or concerns regarding your invoice.   IF you received labwork today, you will receive an invoice from [facility]. Please contact [facility] at [phone] with questions or concerns regarding your invoice.   Our billing staff will not be able to assist you with questions regarding bills from these companies.  You will be contacted with the lab results as  soon as they are available. The fastest way to get your results is to activate your My Chart account. Instructions are located on the last page of this paperwork. If you have not heard from [place] regarding the results in 2 weeks, please contact this office.

## 2017-05-19 NOTE — Patient Instructions (Signed)
See calendar for specific medication instructions and bring it back for each and every office visit for every healthcare provider you see.  Without it,  you may not receive the best quality medical care that we feel you deserve. ° °You will note that the calendar groups together  your maintenance  medications that are timed at particular times of the day.  Think of this as your checklist for what your doctor has instructed you to do until your next evaluation to see what benefit  there is  to staying on a consistent group of medications intended to keep you well.  The other group at the bottom is entirely up to you to use as you see fit  for specific symptoms that may arise between visits that require you to treat them on an as needed basis.  Think of this as your action plan or "what if" list.  ° °Separating the top medications from the bottom group is fundamental to providing you adequate care going forward.   ° ° °Please schedule a follow up visit in 6 months but call sooner if needed  ° ° ° ° ° ° ° °

## 2017-05-20 ENCOUNTER — Encounter: Payer: Self-pay | Admitting: Internal Medicine

## 2017-05-20 NOTE — Assessment & Plan Note (Signed)
HRCT chest 4/17/ 2018 >  suggestive of interstitial lung disease, with a pattern that is most compatible with nonspecific interstitial pneumonia (NSIP).  - 05/19/2017 reported longstanding dx of anklylosing spondylitis   This pattern is very mild clinically and radiographically and fits nicely with AS or other low grade collagen vasc dz > f/u rheum prn but no further pulmonary w/u indicated at this point

## 2017-05-20 NOTE — Assessment & Plan Note (Signed)
07/22/2016    > try reduce symbicort to 80 2bid  - FENO 07/15/2016  =   42 on sym 160 2bid  - 07/30/2016  After extensive coaching HFA effectiveness =    90% > continue symb 80 2bid  - Sinus CT 07/31/2016 >>> neg - FENO 10/10/2016  =   12 during flare while on symb 80 2bid - PFT's  10/17/2016  FEV1 2.40 (89 % ) ratio 87  p 0 % improvement from saba p symb 80 x 2 prior to study with DLCO  60/58 % corrects to 90 % for alv volume and erv =50%  - Allergy profile 10/17/2016 >  Eos 0.0 /  IgE 55 Rast pos dust only   - 10/17/2016  Added singulair 10 mg  - HRCT chest 11/2016  -NSIP Changes/Bronchiectasis noted  - 12/12/2016 spirometry s obst with active symptoms >> trial off symbicort as not convinced helping - 01/13/17 trial of dymista > improved 05/19/2017    Finally has turned th corner with more symptoms suggestive of uacs than asthma and even if he does have an asthma component > All goals of chronic asthma control met including optimal function and elimination of symptoms with minimal need for rescue therapy on just singulair 10 mg maint rx   Contingencies discussed in full including contacting this office immediately if not controlling the symptoms using the rule of two's.      I had an extended discussion with the patient reviewing all relevant studies completed to date and  lasting 15 to 20 minutes of a 25 minute visit    Each maintenance medication was reviewed in detail including most importantly the difference between maintenance and prns and under what circumstances the prns are to be triggered using an action plan format that is not reflected in the computer generated alphabetically organized AVS but trather by a customized med calendar that reflects the AVS meds with confirmed 100% correlation.   In addition, Please see AVS for unique instructions that I personally wrote and verbalized to the the pt in detail and then reviewed with pt  by my nurse highlighting any  changes in therapy recommended at  today's visit to their plan of care.    F/u can be q 6 m, sooner prn

## 2017-05-20 NOTE — Assessment & Plan Note (Signed)
See CT chest 11/26/16 = Scattered areas of cylindrical bronchiectasis - flutter added 12/12/2016   Reviewed approp use of mucinex / flutter as per med calendar

## 2017-05-22 ENCOUNTER — Other Ambulatory Visit: Payer: Self-pay | Admitting: Family Medicine

## 2017-05-22 DIAGNOSIS — N401 Enlarged prostate with lower urinary tract symptoms: Secondary | ICD-10-CM

## 2017-05-22 DIAGNOSIS — R3912 Poor urinary stream: Principal | ICD-10-CM

## 2017-05-22 NOTE — Progress Notes (Signed)
Call patient with results of CT scan, some signs of enlarged prostate with possible bowel obstruction as mild thickening of bladder wall, no other acute findings. He does report some decreased stream for the past year, with some urgency, but no acute changes recently. His lower abdominal pain has subsided and back pain is significantly better with only taking 1 hydrocodone. Last PSA was in April 2015 at 1.8 and he was evaluated by urology indicating BPH without lower urinary tract symptoms at that time.  I will refer him to urology for further evaluation of enlarged prostate and possible outlet obstruction. Deferred blood work here as likely will obtain PSA at urology. RTC precautions if worsening, as well as discuss symptoms of urinary retention if that were to occur and need for ER evaluation.

## 2017-05-24 ENCOUNTER — Ambulatory Visit: Payer: Medicare Other | Admitting: Family Medicine

## 2017-05-29 ENCOUNTER — Telehealth: Payer: Self-pay | Admitting: Cardiovascular Disease

## 2017-05-29 DIAGNOSIS — I10 Essential (primary) hypertension: Secondary | ICD-10-CM | POA: Insufficient documentation

## 2017-05-29 NOTE — Telephone Encounter (Signed)
Patient called to report intermittent CP and pressure over the last 2 days. He reports during an episode, the pain (pain scale 2/10) is directly at the heart and seems to correlate with when the heart beats. The sensation occurs both at rest and on exertion. He denies nausea, sweating, SOB, fluttering during episodes.  He states the pain does not feel musculoskeletal (when he pushes on the spot it does not hurt more or less).  He is taking his medications as directed. This is a new feeling for him and he is a little concerned. He is going on a trip all next week and would like to be evaluated prior to flying. Scheduled patient with Richardson Dopp, PA tomorrow. The patient understands to seek immediate medical attention prior to that time if symptoms worsen.

## 2017-05-29 NOTE — Progress Notes (Signed)
Cardiology Office Note:    Date:  05/30/2017   ID:  [name], DOB [birth year], MRN [id number]  PCP:  [name], MD  Cardiologist:  Dr. [name]   Referring MD: No ref. provider found   Chief Complaint  Patient presents with  . Chest Pain    History of Present Illness:    [name] is a 76 y.o. male with a hx of CAD status post anterior wall myocardial infarction in 7782 complicated by out of hospital ventricular fibrillation arrest treated with primary PCI and stenting of the LAD with a bare metal stent.  Left ventricular function has normalized over time.  Cardiac catheterization in 2014 demonstrated patent stent.  He had intermediate risk nuclear stress test in 5/18 and underwent cardiac catheterization in 6/18 which demonstrated patent LAD stent.  He had severe disease in a small diagonal branch that is not amenable to PCI.  Medical therapy has been continued.  He called in recently with complaints of chest pain.  [name] returns for evaluation of chest pain.  He is here alone.  Over the past 2-3 days, he has noted a left-sided chest pressure.  The discomfort comes and goes.  It may last an hour or less.  He is having some right now.  He denies any radiating symptoms.  He cannot reproduce symptoms with exertion, deep breaths, lying flat.  He can produce pain by pushing on his left chest.  He has not taken nitroglycerin.  He denies any associated dyspnea, nausea, diaphoresis.  He denies syncope.  He denies orthopnea, PND or edema.  He has recently had an abdominal CT for abdominal pain and is being referred to urology for an enlarged prostate.  He does have some lower back pain related to his ankylosing spondylitis.  He denies any significant cough.  He denies chest discomfort related to meals.  Prior CV studies:   The following studies were reviewed today:  Cardiac catheterization 01/14/17 LAD proximal stent patent, D1 75-small branch (unchanged from previous  study) RI mild irregularities LCx mild irregularities RCA mid 40 Medical therapy-diagonal not suitable for PCI  Nuclear stress test 01/08/17 1. Exercise stress ECG was suggestive of ischemia.  The patient was very short of breath with exertion.  2. Perfusion images showed a fixed, small, mild basal anterior perfusion defect and a fixed, medium-sized, mild basal to mid inferior perfusion defect.  The anterior defect does not appear to correspond to a vascular territory and may be artifact.  Cannot rule out inferior infarction though wall motion is normal in the inferior wall.  No evidence for ischemia.  3. Normal EF and wall motion.  EF 57 Intermediate risk Echocardiogram 11/29/17  High resolution chest CT 11/26/16 IMPRESSION: 1. The appearance of the lungs is suggestive of interstitial lung disease, with a pattern that is most compatible with nonspecific interstitial pneumonia (NSIP). The possibility of very mild chronic hypersensitivity pneumonitis should also be considered. No imaging features at this time to strongly suggest usual interstitial pneumonia (UIP). Repeat high-resolution chest CT is recommended in 12 months to assess for temporal changes in the appearance of the lung parenchyma. 2. Aortic atherosclerosis, in addition to left main and 3 vessel coronary artery disease. Assessment for potential risk factor modification, dietary therapy or pharmacologic therapy may be warranted, if clinically indicated.   Past Medical History:  Diagnosis Date  . Abnormal chest x-ray   . Allergic rhinitis   . Ankylosing spondylitis ([place])   .  Anxiety   . Arthritis   . Colonic polyp   . Coronary atherosclerosis of native coronary artery   . Cough   . Dizziness 10/21/2016  . Ganglion cyst of wrist    left  . Heart attack ([place]) 2008  . Hemorrhoids   . History of malaria   . Hyperlipidemia    with low HDL  . Hypertension   . Kidney stone   . Other disorder of muscle, ligament, and  fascia   . Rotator cuff tear   . Swelling of lower extremity 10/16/2015    Past Surgical History:  Procedure Laterality Date  . HEMORRHOID SURGERY  10/10   in [place]  . LEFT HEART CATH AND CORONARY ANGIOGRAPHY N/A 01/14/2017   Procedure: Left Heart Cath and Coronary Angiography;  Surgeon: [name], MD;  Location: [facility];  Service: Cardiovascular;  Laterality: N/A;  . LEFT HEART CATHETERIZATION WITH CORONARY ANGIOGRAM N/A 09/03/2012   Procedure: LEFT HEART CATHETERIZATION WITH CORONARY ANGIOGRAM;  Surgeon: [name], MD;  Location: [facility] CATH [facility];  Service: Cardiovascular;  Laterality: N/A;  . ROTATOR CUFF REPAIR     bilat by Dr. French Ana    Current Medications: Current Meds  Medication Sig  . Apoaequorin (PREVAGEN PO) Take 1 tablet by mouth daily.   [name] aspirin 81 MG EC tablet Take 81 mg by mouth daily.   . Azelastine-Fluticasone (DYMISTA) 137-50 MCG/ACT SUSP Place 1 spray into the nose 2 (two) times daily.  . chlorpheniramine (CHLOR-TRIMETON) 4 MG tablet Take 8 mg by mouth at bedtime.   . famotidine (PEPCID) 20 MG tablet Take 20 mg by mouth at bedtime.  [name] HYDROcodone-acetaminophen (NORCO/VICODIN) 5-325 MG tablet Take 1 tablet by mouth every 6 (six) hours as needed for moderate pain.  . montelukast (SINGULAIR) 10 MG tablet Take 1 tablet (10 mg total) by mouth at bedtime.  . Multiple Vitamins-Minerals (PRESERVISION AREDS) TABS Take 1 tablet by mouth daily.  . pantoprazole (PROTONIX) 40 MG tablet TAKE 1 TABLET BY MOUTH ONCE DAILY 30  TO  60  MINUTES  BEFORE  FIRST  MEAL  OF  THE  DAY  . Respiratory Therapy Supplies (FLUTTER) DEVI Use as directed  . simvastatin (ZOCOR) 20 MG tablet TAKE 1 TABLET EVERY DAY WITH BREAKFAST (NEED TO SCHEDULE YEARLY APPT W/DR COOPER FOR MAY BEFORE ANY REFILLS [phone])  . [DISCONTINUED] amLODipine (NORVASC) 2.5 MG tablet TAKE 1 TABLET EVERY DAY     Allergies:   Tramadol   Social History   Social History  . Marital status: Married     Spouse name: [name]  . Number of children: 2  . Years of education: N/A   Social History Main Topics  . Smoking status: Never Smoker  . Smokeless tobacco: Never Used  . Alcohol use No     Comment: occ 1 beer, wine occ  . Drug use: No  . Sexual activity: Yes   Other Topics Concern  . None   Social History Narrative  . None     Family Hx: The patient's family history includes Cancer in his mother; Diabetes in his mother; Emphysema in his mother; Rheum arthritis in his mother.  ROS:   Please see the history of present illness.    Review of Systems  Constitution: Positive for malaise/fatigue.  Cardiovascular: Positive for chest pain.  Skin: Positive for rash.  Musculoskeletal: Positive for back pain.  Gastrointestinal: Positive for abdominal pain.   All other systems reviewed and are negative.  EKGs/Labs/Other Test Reviewed:    EKG:  EKG is  ordered today.  The ekg ordered today demonstrates normal sinus rhythm, heart rate 65, normal axis, QTC 426 ms, no significant change when compared to prior tracing  Recent Labs: 04/21/2017: ALT 36; BUN 20.2; Creatinine 0.9; Platelets 79 Few Large & Occ giant platelets; Potassium 4.0; Sodium 141 05/19/2017: Hemoglobin 15.8   Recent Lipid Panel Lab Results  Component Value Date/Time   CHOL 105 01/08/2017 07:54 AM   TRIG 57 01/08/2017 07:54 AM   HDL 39 (L) 01/08/2017 07:54 AM   CHOLHDL 2.7 01/08/2017 07:54 AM   CHOLHDL 2.8 12/18/2015 08:31 AM   LDLCALC 55 01/08/2017 07:54 AM    Physical Exam:    VS:  BP 132/70   Pulse 84   Ht 5\' 7"  (1.702 m)   Wt 191 lb (86.6 kg)   BMI 29.91 kg/m     Wt Readings from Last 3 Encounters:  05/30/17 191 lb (86.6 kg)  05/19/17 191 lb 6.4 oz (86.8 kg)  05/19/17 190 lb 12.8 oz (86.5 kg)     Physical Exam  Constitutional: He is oriented to person, place, and time. He appears well-developed and well-nourished. No distress.  HENT:  Head: Normocephalic and atraumatic.  Neck: No JVD present.    Cardiovascular: Normal rate and regular rhythm.   No murmur heard. Pulmonary/Chest: Effort normal. He has no rales. He exhibits tenderness (Left chest).  Abdominal: Soft.  Musculoskeletal: He exhibits no edema.  Neurological: He is alert and oriented to person, place, and time.  Skin: Skin is warm and dry.  Psychiatric: He has a normal mood and affect.    ASSESSMENT:    1. OSA (obstructive sleep apnea)   2. Coronary artery disease involving native coronary artery of native heart with angina pectoris ([place])   3. ILD (interstitial lung disease) ([place])   4. Essential hypertension   5. Chest pain, unspecified type    PLAN:    In order of problems listed above:  1.  Chest pain He has left-sided chest discomfort described as pressure.  This is somewhat atypical for ischemia.  He does have a small diagonal with severe stenosis noted on recent cardiac catheterization but this is not amenable to PCI.  He tells me that his symptoms prior to his heart attack were described as pressure but this is somewhat different.  His ECG is normal.  As he had a recent cardiac catheterization, I am not convinced that his symptoms are related to angina.  Stress testing will not be helpful as it was abnormal in May.  Since his symptoms are somewhat prolonged at times, I believe a negative troponin would help rule out the possibility of unstable angina.  - Obtain stat troponin today >> send to the ED if elevated  - Obtain chest x-ray   - Use Tylenol, heat/ice cover for chest wall pain.  2. Coronary artery disease involving native coronary artery of native heart with angina pectoris [facility]) -  As noted, he just had a Cardiac Catheterization in June 2018.  The LAD stent was patent.  There was severe stenosis in a small diagonal that is not amenable to angioplasty or stenting.  He is on Amlodipine, ASA, statin.  I have suggested he increase his Amlodipine to 2.5 mg Twice daily to see if this helps.  Obtain stat Troponin  today.   3. ILD (interstitial lung disease) ([place])  Follow up with Pulmonology as planned.  Obtain chest X-ray as noted  to evaluate chest pain.    4. Essential hypertension Blood pressure controlled.  Adjust amlodipine as noted above.  I have asked him to monitor for symptomatic hypotension.  If he experiences this, we can resume amlodipine 2.5 mg daily.  5.  Sleep Apnea He describes fatigue and daytime hypersomnolence.  He is not using CPAP.  I have tried to encourage him to resume using CPAP.  Dispo:  Return in about 4 weeks (around 06/27/2017) for Close Follow Up, w/ Dr. Burt Knack.   Medication Adjustments/Labs and Tests Ordered: Current medicines are reviewed at length with the patient today.  Concerns regarding medicines are outlined above.  Tests Ordered: Orders Placed This Encounter  Procedures  . [name] 2 View  . Troponin T  . EKG 12-Lead   Medication Changes: Meds ordered this encounter  Medications  . amLODipine (NORVASC) 2.5 MG tablet    Sig: Take 1 tablet (2.5 mg total) by mouth 2 (two) times daily.    Dispense:  180 tablet    Refill:  3    Signed, Richardson Dopp, PA-C  05/30/2017 9:42 AM    [address], [place], [place]  [address] Phone: [phone]; Fax: [phone]

## 2017-05-29 NOTE — Telephone Encounter (Signed)
New message     Patient states he is going out of town and is having issues with CP, wants to know if he can been seen asap. Please call.  Pt c/o of Chest Pain: STAT if CP now or developed within 24 hours  1. Are you having CP right now? No  2. Are you experiencing any other symptoms (ex. SOB, nausea, vomiting, sweating)? NO  3. How long have you been experiencing CP? About 2 days  4. Is your CP continuous or coming and going? Coming and going  5. Have you taken Nitroglycerin? No ?

## 2017-05-30 ENCOUNTER — Encounter: Payer: Self-pay | Admitting: Physician Assistant

## 2017-05-30 ENCOUNTER — Ambulatory Visit (INDEPENDENT_AMBULATORY_CARE_PROVIDER_SITE_OTHER): Payer: Medicare Other | Admitting: Physician Assistant

## 2017-05-30 ENCOUNTER — Ambulatory Visit
Admission: RE | Admit: 2017-05-30 | Discharge: 2017-05-30 | Disposition: A | Discharge status: Home / Self Care | Payer: Medicare Other | Source: Ambulatory Visit | Attending: Physician Assistant | Admitting: Physician Assistant

## 2017-05-30 VITALS — BP 132/70 | HR 84 | Ht 67.0 in | Wt 191.0 lb

## 2017-05-30 DIAGNOSIS — I10 Essential (primary) hypertension: Secondary | ICD-10-CM | POA: Diagnosis not present

## 2017-05-30 DIAGNOSIS — G4733 Obstructive sleep apnea (adult) (pediatric): Secondary | ICD-10-CM

## 2017-05-30 DIAGNOSIS — J849 Interstitial pulmonary disease, unspecified: Secondary | ICD-10-CM

## 2017-05-30 DIAGNOSIS — I2510 Atherosclerotic heart disease of native coronary artery without angina pectoris: Secondary | ICD-10-CM

## 2017-05-30 DIAGNOSIS — I25119 Atherosclerotic heart disease of native coronary artery with unspecified angina pectoris: Secondary | ICD-10-CM | POA: Diagnosis not present

## 2017-05-30 DIAGNOSIS — R079 Chest pain, unspecified: Secondary | ICD-10-CM | POA: Diagnosis not present

## 2017-05-30 DIAGNOSIS — I209 Angina pectoris, unspecified: Secondary | ICD-10-CM

## 2017-05-30 LAB — TROPONIN T

## 2017-05-30 MED ORDER — AMLODIPINE BESYLATE 2.5 MG PO TABS: 2.5000 mg | ORAL_TABLET | Freq: Two times a day (BID) | ORAL | 3 refills | Status: DC

## 2017-05-30 NOTE — Patient Instructions (Addendum)
Medication Instructions:  Increase Amlodipine to 2.5 mg Twice daily  If your blood pressure runs low (less than 100 on the top number) and you feel tired, dizzy, weak - resume Amlodipine 2.5 mg Once daily and call us.  Labwork: TODAY: STAT TROPONIN T  Testing/Procedures: Chest Xray today: [facility]   Follow-Up: VIN BHAGAT ON November 19 @ 1:30 PM SAME DAY DR. Burt Knack IS IN THE OFFICE   Any Other Special Instructions Will Be Listed Below (If Applicable). You can take Tylenol as needed for pain. Use heat then ice to your chest 2-3 times a day for 1 week.  If you need a refill on your cardiac medications before your next appointment, please call your pharmacy.

## 2017-06-17 DIAGNOSIS — K6289 Other specified diseases of anus and rectum: Secondary | ICD-10-CM | POA: Diagnosis not present

## 2017-06-17 DIAGNOSIS — K648 Other hemorrhoids: Secondary | ICD-10-CM | POA: Diagnosis not present

## 2017-06-17 DIAGNOSIS — D126 Benign neoplasm of colon, unspecified: Secondary | ICD-10-CM | POA: Diagnosis not present

## 2017-06-30 ENCOUNTER — Ambulatory Visit (INDEPENDENT_AMBULATORY_CARE_PROVIDER_SITE_OTHER): Payer: Medicare Other | Admitting: Physician Assistant

## 2017-06-30 ENCOUNTER — Encounter: Payer: Self-pay | Admitting: Physician Assistant

## 2017-06-30 VITALS — BP 120/82 | HR 63 | Ht 67.0 in | Wt 191.4 lb

## 2017-06-30 DIAGNOSIS — I10 Essential (primary) hypertension: Secondary | ICD-10-CM

## 2017-06-30 DIAGNOSIS — G4733 Obstructive sleep apnea (adult) (pediatric): Secondary | ICD-10-CM | POA: Diagnosis not present

## 2017-06-30 DIAGNOSIS — E7800 Pure hypercholesterolemia, unspecified: Secondary | ICD-10-CM

## 2017-06-30 DIAGNOSIS — I2510 Atherosclerotic heart disease of native coronary artery without angina pectoris: Secondary | ICD-10-CM | POA: Diagnosis not present

## 2017-06-30 DIAGNOSIS — I209 Angina pectoris, unspecified: Secondary | ICD-10-CM | POA: Diagnosis not present

## 2017-06-30 DIAGNOSIS — I25119 Atherosclerotic heart disease of native coronary artery with unspecified angina pectoris: Secondary | ICD-10-CM

## 2017-06-30 MED ORDER — AMLODIPINE BESYLATE 5 MG PO TABS: 5.0000 mg | ORAL_TABLET | Freq: Every day | ORAL | 3 refills | Status: DC

## 2017-06-30 NOTE — Patient Instructions (Signed)
Medication Instructions:  Your physician has recommended you make the following change in your medication:  1.  CHANGE your Amlodipine to 5 mg daily   Labwork: None ordered  Testing/Procedures: None ordered  Follow-Up: Your physician recommends that you schedule a follow-up appointment in: 10/02/17 ARRIVE AT 9:00 TO SEE DR. Burt Knack   Any Other Special Instructions Will Be Listed Below (If Applicable).     If you need a refill on your cardiac medications before your next appointment, please call your pharmacy.

## 2017-06-30 NOTE — Progress Notes (Signed)
Cardiology Office Note    Date:  06/30/2017   ID:  [name], DOB 1940/08/31, MRN [id number]  PCP:  [name], MD  Cardiologist: Dr. Burt Knack  Chief Complaint  Patient presents with  . Follow-up    CAD/Seen for Dr. Burt Knack    History of Present Illness:  [name] is a 76 y.o. male with history of CAD status post anterior wall MI 8242 complicated by out of hospital V. fib arrest treated with BMS to the LAD.  LV dysfunction normalized over time.  Cardiac cath in 2014 patent stent.  Intermediate risk nuclear stress test 12/2016 and cath 01/2017 patent LAD stent.  He had severe disease in a small diagonal branch is not amenable to PCI and medical therapy was recommended.  Patient saw Richardson Dopp 05/30/17 for recurrent left-sided chest pressure.  Stat troponin T was ordered and was negative.  Chest x-ray was stable with elevation of right hemidiaphragm no active disease.  Blood pressure was elevated so amlodipine was increased to 2.5 mg twice daily.  He was encouraged to use his CPAP.  Patient doing better. No further chest tightness. 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  Having trouble with his CPAP machine is not using it regularly.       Past Medical History:  Diagnosis Date  . Abnormal chest x-ray   . Allergic rhinitis   . Ankylosing spondylitis ([place])   . Anxiety   . Arthritis   . Colonic polyp   . Coronary atherosclerosis of native coronary artery   . Cough   . Dizziness 10/21/2016  . Ganglion cyst of wrist    left  . Heart attack ([place]) 2008  . Hemorrhoids   . History of malaria   . Hyperlipidemia    with low HDL  . Hypertension   . Kidney stone   . Other disorder of muscle, ligament, and fascia   . Rotator cuff tear   . Swelling of lower extremity 10/16/2015    Past Surgical History:  Procedure Laterality Date  . HEMORRHOID SURGERY  10/10   in [place]  . Left Heart Cath and Coronary Angiography N/A  01/14/2017   Performed by [name], MD at [place] [facility]  . LEFT HEART CATHETERIZATION WITH CORONARY ANGIOGRAM N/A 09/03/2012   Performed by [name], MD at [facility] CATH LAB  . ROTATOR CUFF REPAIR     bilat by Dr. French Ana    Current Medications: Current Meds  Medication Sig  . amLODipine (NORVASC) 2.5 MG tablet Take 1 tablet (2.5 mg total) by mouth 2 (two) times daily.  [name] Apoaequorin (PREVAGEN PO) Take 1 tablet by mouth daily.   [name] aspirin 81 MG EC tablet Take 81 mg by mouth daily.   . Azelastine-Fluticasone (DYMISTA) 137-50 MCG/ACT SUSP Place 1 spray into the nose 2 (two) times daily.  . chlorpheniramine (CHLOR-TRIMETON) 4 MG tablet Take 8 mg by mouth at bedtime.   . famotidine (PEPCID) 20 MG tablet Take 20 mg by mouth at bedtime.  [name] HYDROcodone-acetaminophen (NORCO/VICODIN) 5-325 MG tablet Take 1 tablet by mouth every 6 (six) hours as needed for moderate pain.  . montelukast (SINGULAIR) 10 MG tablet Take 1 tablet (10 mg total) by mouth at bedtime.  . Multiple Vitamins-Minerals (PRESERVISION AREDS) TABS Take 1 tablet by mouth daily.  . pantoprazole (PROTONIX) 40 MG tablet TAKE 1 TABLET BY MOUTH ONCE DAILY 30  TO  60  MINUTES  BEFORE  FIRST  MEAL  OF  THE  DAY  . Respiratory Therapy Supplies (FLUTTER) DEVI Use as directed  . simvastatin (ZOCOR) 20 MG tablet TAKE 1 TABLET EVERY DAY WITH BREAKFAST (NEED TO SCHEDULE YEARLY APPT W/DR COOPER FOR MAY BEFORE ANY REFILLS [phone])     Allergies:   Tramadol   Social History   Socioeconomic History  . Marital status: Married    Spouse name: [name]  . Number of children: 2  . Years of education: None  . Highest education level: None  Social Needs  . Financial resource strain: None  . Food insecurity - worry: None  . Food insecurity - inability: None  . Transportation needs - medical: None  . Transportation needs - non-medical: None  Occupational History  . None  Tobacco Use  . Smoking status: Never Smoker  . Smokeless  tobacco: Never Used  Substance and Sexual Activity  . Alcohol use: No    Comment: occ 1 beer, wine occ  . Drug use: No  . Sexual activity: Yes  Other Topics Concern  . None  Social History Narrative  . None     Family History:  The patient's   family history includes Cancer in his mother; Diabetes in his mother; Emphysema in his mother; Rheum arthritis in his mother.   ROS:   Please see the history of present illness.    Review of Systems  Constitution: Negative.  HENT: Negative.   Cardiovascular: Negative.   Respiratory: Negative.   Endocrine: Negative.   Hematologic/Lymphatic: Negative.   Musculoskeletal: Negative.   Gastrointestinal: Negative.   Genitourinary: Negative.   Neurological: Negative.    All other systems reviewed and are negative.   PHYSICAL EXAM:   VS:  BP 120/82   Pulse 63   Ht 5\' 7"  (1.702 m)   Wt 191 lb 6.4 oz (86.8 kg)   SpO2 96%   BMI 29.98 kg/m   Physical Exam  GEN: Well nourished, well developed, in no acute distress  Neck: no JVD, carotid bruits, or masses Cardiac:RRR; no murmurs, rubs, or gallops  Respiratory:  clear to auscultation bilaterally, normal work of breathing GI: soft, nontender, nondistended, + BS Ext: without cyanosis, clubbing, or edema, Good distal pulses bilaterally Neuro:  Alert and Oriented x 3 Psych: euthymic mood, full affect  Wt Readings from Last 3 Encounters:  06/30/17 191 lb 6.4 oz (86.8 kg)  05/30/17 191 lb (86.6 kg)  05/19/17 191 lb 6.4 oz (86.8 kg)      Studies/Labs Reviewed:   EKG:  EKG is not ordered today.   Recent Labs: 04/21/2017: ALT 36; BUN 20.2; Creatinine 0.9; Platelets 79 Few Large & Occ giant platelets; Potassium 4.0; Sodium 141 05/19/2017: Hemoglobin 15.8   Lipid Panel    Component Value Date/Time   CHOL 105 01/08/2017 0754   TRIG 57 01/08/2017 0754   HDL 39 (L) 01/08/2017 0754   CHOLHDL 2.7 01/08/2017 0754   CHOLHDL 2.8 12/18/2015 0831   VLDL 14 12/18/2015 0831   LDLCALC 55 01/08/2017  0754    Additional studies/ records that were reviewed today include:   Cardiac catheterization 01/14/17 LAD proximal stent patent, D1 75-small branch (unchanged from previous study) RI mild irregularities LCx mild irregularities RCA mid 40 Medical therapy-diagonal not suitable for PCI   Nuclear stress test 01/08/17 1. Exercise stress ECG was suggestive of ischemia.  The patient was very short of breath with exertion.  2. Perfusion images showed a fixed, small, mild basal anterior  perfusion defect and a fixed, medium-sized, mild basal to mid inferior perfusion defect.  The anterior defect does not appear to correspond to a vascular territory and may be artifact.  Cannot rule out inferior infarction though wall motion is normal in the inferior wall.  No evidence for ischemia.  3. Normal EF and wall motion.  EF 57 Intermediate risk Echocardiogram 11/29/17   High resolution chest CT 11/26/16 IMPRESSION: 1. The appearance of the lungs is suggestive of interstitial lung disease, with a pattern that is most compatible with nonspecific interstitial pneumonia (NSIP). The possibility of very mild chronic hypersensitivity pneumonitis should also be considered. No imaging features at this time to strongly suggest usual interstitial pneumonia (UIP). Repeat high-resolution chest CT is recommended in 12 months to assess for temporal changes in the appearance of the lung parenchyma. 2. Aortic atherosclerosis, in addition to left main and 3 vessel coronary artery disease. Assessment for potential risk factor modification, dietary therapy or pharmacologic therapy may be warranted, if clinically indicated.    ASSESSMENT:    1. Coronary artery disease involving native coronary artery of native heart with angina pectoris ([place])   2. Essential hypertension   3. Obstructive sleep apnea   4. HYPERCHOLESTEROLEMIA      PLAN:  In order of problems listed above:  CAD last cath 01/2017 LAD stent was  patent but he had severe stenosis in a small diagonal that is not amenable to PCI.  Amlodipine was increased to 2.5 mg twice daily.  Doing better without angina.  Will change amlodipine to 5 mg once daily.  Patient does not like to take this many pills.  Follow-up with Dr. Burt Knack in 3 months.  Essential hypertension well-controlled on amlodipine  Obstructive sleep apnea not using CPAP.  Recommend he follow-up with pulmonary for this who prescribed it.  Having trouble with the feedings.  Hypercholesterolemia continue Zocor.  Lipid panel 12/2016 stable    Medication Adjustments/Labs and Tests Ordered: Current medicines are reviewed at length with the patient today.  Concerns regarding medicines are outlined above.  Medication changes, Labs and Tests ordered today are listed in the Patient Instructions below. There are no Patient Instructions on file for this visit.   Sumner Boast, PA-C  06/30/2017 9:51 AM    [facility] [place], [place], [place]  [address] Phone: [phone]; Fax: [phone]

## 2017-07-08 DIAGNOSIS — K6289 Other specified diseases of anus and rectum: Secondary | ICD-10-CM | POA: Diagnosis not present

## 2017-07-08 DIAGNOSIS — K648 Other hemorrhoids: Secondary | ICD-10-CM | POA: Diagnosis not present

## 2017-07-10 DIAGNOSIS — N401 Enlarged prostate with lower urinary tract symptoms: Secondary | ICD-10-CM | POA: Diagnosis not present

## 2017-07-10 DIAGNOSIS — R3912 Poor urinary stream: Secondary | ICD-10-CM | POA: Diagnosis not present

## 2017-08-10 ENCOUNTER — Other Ambulatory Visit: Payer: Self-pay | Admitting: Internal Medicine

## 2017-08-10 DIAGNOSIS — J45991 Cough variant asthma: Secondary | ICD-10-CM

## 2017-09-18 DIAGNOSIS — L814 Other melanin hyperpigmentation: Secondary | ICD-10-CM | POA: Diagnosis not present

## 2017-09-18 DIAGNOSIS — L821 Other seborrheic keratosis: Secondary | ICD-10-CM | POA: Diagnosis not present

## 2017-09-18 DIAGNOSIS — L820 Inflamed seborrheic keratosis: Secondary | ICD-10-CM | POA: Diagnosis not present

## 2017-09-18 DIAGNOSIS — D225 Melanocytic nevi of trunk: Secondary | ICD-10-CM | POA: Diagnosis not present

## 2017-09-18 DIAGNOSIS — C44622 Squamous cell carcinoma of skin of right upper limb, including shoulder: Secondary | ICD-10-CM | POA: Diagnosis not present

## 2017-09-18 DIAGNOSIS — R202 Paresthesia of skin: Secondary | ICD-10-CM | POA: Diagnosis not present

## 2017-09-18 DIAGNOSIS — D485 Neoplasm of uncertain behavior of skin: Secondary | ICD-10-CM | POA: Diagnosis not present

## 2017-09-18 DIAGNOSIS — L570 Actinic keratosis: Secondary | ICD-10-CM | POA: Diagnosis not present

## 2017-09-25 ENCOUNTER — Encounter: Payer: Self-pay | Admitting: Cardiovascular Disease

## 2017-10-02 ENCOUNTER — Ambulatory Visit (INDEPENDENT_AMBULATORY_CARE_PROVIDER_SITE_OTHER): Payer: Medicare Other | Admitting: Cardiovascular Disease

## 2017-10-02 ENCOUNTER — Encounter: Payer: Self-pay | Admitting: Cardiovascular Disease

## 2017-10-02 VITALS — BP 120/80 | HR 58 | Ht 67.0 in | Wt 191.8 lb

## 2017-10-02 DIAGNOSIS — I10 Essential (primary) hypertension: Secondary | ICD-10-CM

## 2017-10-02 DIAGNOSIS — E7800 Pure hypercholesterolemia, unspecified: Secondary | ICD-10-CM

## 2017-10-02 DIAGNOSIS — I25119 Atherosclerotic heart disease of native coronary artery with unspecified angina pectoris: Secondary | ICD-10-CM

## 2017-10-02 NOTE — Progress Notes (Signed)
Cardiology Office Note Date:  10/03/2017   ID:  DIALLO PONDER, DOB 06/12/1941, MRN [id number]  PCP:  [name], MD  Cardiologist:  [name], MD    Chief Complaint  Patient presents with  . Shortness of Breath     History of Present Illness: [name] is a 77 y.o. male who presents for follow-up evaluation.  The patient is followed for coronary artery disease.  He has a history of anterior wall MI complicated by out of hospital ventricular fibrillation arrest.  He was treated with bare-metal stenting of the LAD at that time.  LV function is been normal on all of his follow-up evaluations.  He had a cardiac catheterization last year demonstrating patency of his LAD circumflex and right coronary arteries were patent without significant obstruction.  He has been managed medically.  The patient is here alone today.  He is doing fine.  He denies any recent chest pain or pressure, heart palpitations, leg swelling, orthopnea, or PND.  He does admit to shortness of breath with physical activity.  He is active but not engaged in any regular exercise.   Past Medical History:  Diagnosis Date  . Abnormal chest x-ray   . Allergic rhinitis   . Ankylosing spondylitis ([place])   . Anxiety   . Arthritis   . Colonic polyp   . Coronary atherosclerosis of native coronary artery   . Cough   . Dizziness 10/21/2016  . Ganglion cyst of wrist    left  . Heart attack ([place]) 2008  . Hemorrhoids   . History of malaria   . Hyperlipidemia    with low HDL  . Hypertension   . Kidney stone   . Other disorder of muscle, ligament, and fascia   . Rotator cuff tear   . Swelling of lower extremity 10/16/2015    Past Surgical History:  Procedure Laterality Date  . HEMORRHOID SURGERY  10/10   in [place]  . LEFT HEART CATH AND CORONARY ANGIOGRAPHY N/A 01/14/2017   Procedure: Left Heart Cath and Coronary Angiography;  Surgeon: [name], MD;  Location: [facility];  Service:  Cardiovascular;  Laterality: N/A;  . LEFT HEART CATHETERIZATION WITH CORONARY ANGIOGRAM N/A 09/03/2012   Procedure: LEFT HEART CATHETERIZATION WITH CORONARY ANGIOGRAM;  Surgeon: [name], MD;  Location: [facility] CATH LAB;  Service: Cardiovascular;  Laterality: N/A;  . ROTATOR CUFF REPAIR     bilat by Dr. French Ana    Current Outpatient Medications  Medication Sig Dispense Refill  . amLODipine (NORVASC) 5 MG tablet Take 5 mg by mouth daily.    [name] Apoaequorin (PREVAGEN PO) Take 1 tablet by mouth daily.     [name] aspirin 81 MG EC tablet Take 81 mg by mouth daily.     . chlorpheniramine (CHLOR-TRIMETON) 4 MG tablet Take 8 mg by mouth at bedtime.     . famotidine (PEPCID) 20 MG tablet Take 20 mg by mouth at bedtime.    . montelukast (SINGULAIR) 10 MG tablet Take 1 tablet (10 mg total) by mouth at bedtime. 30 tablet 11  . pantoprazole (PROTONIX) 40 MG tablet TAKE 1 TABLET BY MOUTH ONCE DAILY 30-60 MINUTES BEFORE FIRST MEAL OF THE DAY 30 tablet 2  . Respiratory Therapy Supplies (FLUTTER) DEVI Use as directed 1 each 0  . simvastatin (ZOCOR) 20 MG tablet TAKE 1 TABLET EVERY DAY WITH BREAKFAST (NEED TO SCHEDULE YEARLY APPT W/DR Valeta Paz FOR MAY BEFORE ANY REFILLS [phone]) 90  tablet 3   No current facility-administered medications for this visit.     Allergies:   Tramadol   Social History:  The patient  reports that  has never smoked. he has never used smokeless tobacco. He reports that he does not drink alcohol or use drugs.   Family History:  The patient's family history includes Cancer in his mother; Diabetes in his mother; Emphysema in his mother; Rheum arthritis in his mother.    ROS:  Please see the history of present illness.  Otherwise, review of systems is positive for back pain, snoring.  All other systems are reviewed and negative.    PHYSICAL EXAM: VS:  BP 120/80   Pulse (!) 58   Ht 5\' 7"  (1.702 m)   Wt 191 lb 12.8 oz (87 kg)   BMI 30.04 kg/m  , BMI Body mass index is 30.04  kg/m. GEN: Well nourished, well developed, in no acute distress  HEENT: normal  Neck: no JVD, no masses. No carotid bruits Cardiac: RRR without murmur or gallop                Respiratory:  clear to auscultation bilaterally, normal work of breathing GI: soft, nontender, nondistended, + BS MS: no deformity or atrophy  Ext: no pretibial edema, pedal pulses 2+= bilaterally Skin: warm and dry, no rash Neuro:  Strength and sensation are intact Psych: euthymic mood, full affect  EKG:  EKG is not ordered today.  Recent Labs: 04/21/2017: ALT 36; BUN 20.2; Creatinine 0.9; Platelets 79 Few Large & Occ giant platelets; Potassium 4.0; Sodium 141 05/19/2017: Hemoglobin 15.8   Lipid Panel     Component Value Date/Time   CHOL 105 01/08/2017 0754   TRIG 57 01/08/2017 0754   HDL 39 (L) 01/08/2017 0754   CHOLHDL 2.7 01/08/2017 0754   CHOLHDL 2.8 12/18/2015 0831   VLDL 14 12/18/2015 0831   LDLCALC 55 01/08/2017 0754      Wt Readings from Last 3 Encounters:  10/02/17 191 lb 12.8 oz (87 kg)  06/30/17 191 lb 6.4 oz (86.8 kg)  05/30/17 191 lb (86.6 kg)     Cardiac Studies Reviewed: Cardiac Cath 01-14-2017: Conclusion   1. Continued patency of the LAD stented segment with no significant restenosis 2. Severe stenosis of a small diagonal branch unchanged from the previous study in 2014 3. Mild nonobstructive disease of the right coronary artery and left circumflex 4. Normal LVEDP with known normal LV systolic function by noninvasive assessment  Recommendations: Continued medical therapy. I don't think the patient's diagonal stenosis is causing any symptoms and clearly wouldn't be related to his exertional dyspnea. It may however be the reason he has an abnormal stress ECG. Recommend ongoing medical therapy with initiation of an exercise program. The diagonal is small and not well suited for PCI.  Indications   Abnormal nuclear cardiac imaging test [R93.1 (ICD-10-CM)]  Procedural  Details/Technique   Technical Details INDICATION: abnormal stress test, shortness of breath  PROCEDURAL DETAILS: The right wrist was prepped, draped, and anesthetized with 1% lidocaine. Using the modified Seldinger technique, a 5/6 French Slender sheath was introduced into the right radial artery. 3 mg of verapamil was administered through the sheath, weight-based unfractionated heparin was administered intravenously. Standard Judkins catheters were used for selective coronary angiography and recording of LV pressure. For the RCA, an AL-1 catheter is used. Catheter exchanges were performed over an exchange length guidewire. There were no immediate procedural complications. A TR band was used for radial  hemostasis at the completion of the procedure. The patient was transferred to the post catheterization recovery area for further monitoring.    Estimated blood loss <50 mL.  During this procedure the patient was administered the following to achieve and maintain moderate conscious sedation: Versed 2 mg, Fentanyl 25 mcg, while the patient's heart rate, blood pressure, and oxygen saturation were continuously monitored. The period of conscious sedation was 20 minutes, of which I was present face-to-face 100% of this time.  Coronary Findings   Diagnostic  Dominance: Right  Left Anterior Descending  Prox LAD to Mid LAD lesion 0% stenosed  Prox LAD to Mid LAD lesion with no stenosis was previously treated.  First Diagonal Branch  Vessel is small in size.  1st Diag lesion 75% stenosed  Small diagonal branch with severe stenosis unchanged from the previous study  Ramus Intermedius  Vessel is small. There is mild the vessel.  Left Circumflex  There is mild the vessel.  Right Coronary Artery  There is mild the vessel.  Mid RCA lesion 40% stenosed  There is mild stenosis unchanged from the previous study  Intervention   No interventions have been documented.  Coronary Diagrams   Diagnostic  Diagram        ASSESSMENT AND PLAN: 1.  Coronary artery disease, native vessel, without angina: The patient appears stable.  His most recent cardiac catheterization results are reviewed.  I did not make any changes in his medicines today   2.  Hypertension: Blood pressure is controlled on amlodipine 5 mg daily.  3.  Hyperlipidemia: Treated with simvastatin.  Most recent lipids are reviewed.  4.  Shortness of breath: Chronic, long-standing complaint.  Normal pulmonary exam.  Chest x-ray reviewed and demonstrates an elevated right hemidiaphragm which could play a role I suspect.  Encouraged him about initiation of an exercise program.  Current medicines are reviewed with the patient today.  The patient does not have concerns regarding medicines.  Labs/ tests ordered today include:  No orders of the defined types were placed in this encounter.   Disposition:   FU one year  Signed, [name], MD  10/03/2017 5:56 PM    [address], [place], [place]  [address] Phone: [phone]; Fax: [phone]

## 2017-10-02 NOTE — Patient Instructions (Signed)

## 2017-10-03 ENCOUNTER — Encounter: Payer: Self-pay | Admitting: Cardiovascular Disease

## 2017-10-20 ENCOUNTER — Inpatient Hospital Stay: Payer: Medicare Other | Attending: Internal Medicine | Admitting: Internal Medicine

## 2017-10-20 ENCOUNTER — Encounter: Payer: Self-pay | Admitting: Internal Medicine

## 2017-10-20 ENCOUNTER — Inpatient Hospital Stay: Payer: Medicare Other

## 2017-10-20 VITALS — BP 122/56 | HR 51 | Temp 98.7°F | Resp 18 | Ht 67.0 in | Wt 188.9 lb

## 2017-10-20 DIAGNOSIS — I25119 Atherosclerotic heart disease of native coronary artery with unspecified angina pectoris: Secondary | ICD-10-CM | POA: Insufficient documentation

## 2017-10-20 DIAGNOSIS — D696 Thrombocytopenia, unspecified: Secondary | ICD-10-CM

## 2017-10-20 LAB — CBC WITH DIFFERENTIAL/PLATELET
Basophils Absolute: 0 10*3/uL (ref 0.0–0.1)
Basophils Relative: 0 %
Eosinophils Absolute: 0 10*3/uL (ref 0.0–0.5)
Eosinophils Relative: 0 %
HEMATOCRIT: 45 % (ref 38.4–49.9)
HEMOGLOBIN: 15.1 g/dL (ref 13.0–17.1)
LYMPHS PCT: 24 %
Lymphs Abs: 1.3 10*3/uL (ref 0.9–3.3)
MCH: 30.4 pg (ref 27.2–33.4)
MCHC: 33.6 g/dL (ref 32.0–36.0)
MCV: 90.5 fL (ref 79.3–98.0)
MONO ABS: 0.8 10*3/uL (ref 0.1–0.9)
MONOS PCT: 14 %
NEUTROS ABS: 3.3 10*3/uL (ref 1.5–6.5)
Neutrophils Relative %: 62 %
Platelets: 92 10*3/uL — ABNORMAL LOW (ref 140–400)
RBC: 4.97 MIL/uL (ref 4.20–5.82)
RDW: 14.6 % (ref 11.0–14.6)
WBC: 5.4 10*3/uL (ref 4.0–10.3)

## 2017-10-20 LAB — COMPREHENSIVE METABOLIC PANEL
ALBUMIN: 4.2 g/dL (ref 3.5–5.0)
ALK PHOS: 61 U/L (ref 40–150)
ALT: 33 U/L (ref 0–55)
ANION GAP: 8 (ref 3–11)
AST: 23 U/L (ref 5–34)
BILIRUBIN TOTAL: 0.8 mg/dL (ref 0.2–1.2)
BUN: 19 mg/dL (ref 7–26)
CALCIUM: 9.7 mg/dL (ref 8.4–10.4)
CO2: 27 mmol/L (ref 22–29)
Chloride: 105 mmol/L (ref 98–109)
Creatinine, Ser: 0.95 mg/dL (ref 0.70–1.30)
GFR calc Af Amer: >60 mL/min (ref >60)
GFR calc non Af Amer: >60 mL/min (ref >60)
GLUCOSE: 138 mg/dL (ref 70–140)
POTASSIUM: 4.2 mmol/L (ref 3.5–5.1)
Sodium: 140 mmol/L (ref 136–145)
TOTAL PROTEIN: 7.1 g/dL (ref 6.4–8.3)

## 2017-10-20 LAB — LACTATE DEHYDROGENASE: LDH: 123 U/L — AB (ref 125–245)

## 2017-10-20 NOTE — Progress Notes (Signed)
[facility] [phone]   Fax:([phone]  OFFICE PROGRESS NOTE  [name], MD [place] [id number]  DIAGNOSIS: Thrombocytopenia, most likely idiopathic thrombocytopenic purpura.  PRIOR THERAPY:None.  CURRENT THERAPY: observation.  INTERVAL HISTORY: [name] 77 y.o. male returns to the clinic today for 6 months follow-up visit.  The patient is feeling fine today with no specific complaints.  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  He denied having any chest pain, shortness of breath, cough or hemoptysis.  He denied having any fever or chills.  He has no nausea, vomiting, diarrhea or constipation.  The patient denied having any bleeding, bruises or ecchymosis.  He is here today for evaluation with repeat CBC, comprehensive metabolic panel and LDH.   MEDICAL HISTORY: Past Medical History:  Diagnosis Date  . Abnormal chest x-ray   . Allergic rhinitis   . Ankylosing spondylitis ([place])   . Anxiety   . Arthritis   . Colonic polyp   . Coronary atherosclerosis of native coronary artery   . Cough   . Dizziness 10/21/2016  . Ganglion cyst of wrist    left  . Heart attack ([place]) 2008  . Hemorrhoids   . History of malaria   . Hyperlipidemia    with low HDL  . Hypertension   . Kidney stone   . Other disorder of muscle, ligament, and fascia   . Rotator cuff tear   . Swelling of lower extremity 10/16/2015    ALLERGIES:  is allergic to tramadol.  MEDICATIONS:  Current Outpatient Medications  Medication Sig Dispense Refill  . amLODipine (NORVASC) 5 MG tablet Take 5 mg by mouth daily.    [name] Apoaequorin (PREVAGEN PO) Take 1 tablet by mouth daily.     [name] aspirin 81 MG EC tablet Take 81 mg by mouth daily.     . chlorpheniramine (CHLOR-TRIMETON) 4 MG tablet Take 8 mg by mouth at bedtime.     . famotidine (PEPCID) 20 MG tablet Take 20 mg by mouth at bedtime.    . montelukast (SINGULAIR) 10 MG  tablet Take 1 tablet (10 mg total) by mouth at bedtime. 30 tablet 11  . pantoprazole (PROTONIX) 40 MG tablet TAKE 1 TABLET BY MOUTH ONCE DAILY 30-60 MINUTES BEFORE FIRST MEAL OF THE DAY 30 tablet 2  . Respiratory Therapy Supplies (FLUTTER) DEVI Use as directed 1 each 0  . simvastatin (ZOCOR) 20 MG tablet TAKE 1 TABLET EVERY DAY WITH BREAKFAST (NEED TO SCHEDULE YEARLY APPT W/DR COOPER FOR MAY BEFORE ANY REFILLS [phone]) 90 tablet 3   No current facility-administered medications for this visit.     SURGICAL HISTORY:  Past Surgical History:  Procedure Laterality Date  . HEMORRHOID SURGERY  10/10   in [place]  . LEFT HEART CATH AND CORONARY ANGIOGRAPHY N/A 01/14/2017   Procedure: Left Heart Cath and Coronary Angiography;  Surgeon: [name], MD;  Location: [facility];  Service: Cardiovascular;  Laterality: N/A;  . LEFT HEART CATHETERIZATION WITH CORONARY ANGIOGRAM N/A 09/03/2012   Procedure: LEFT HEART CATHETERIZATION WITH CORONARY ANGIOGRAM;  Surgeon: [name], MD;  Location: [facility] CATH LAB;  Service: Cardiovascular;  Laterality: N/A;  . ROTATOR CUFF REPAIR     bilat by Dr. French Ana    REVIEW OF SYSTEMS:  A comprehensive review of systems was negative.   PHYSICAL EXAMINATION: General appearance: alert, cooperative and no distress Head: Normocephalic, without  obvious abnormality, atraumatic Neck: no adenopathy Lymph nodes: Cervical, supraclavicular, and axillary nodes normal. Resp: clear to auscultation bilaterally Back: symmetric, no curvature. ROM normal. No CVA tenderness. Cardio: regular rate and rhythm, S1, S2 normal, no murmur, click, rub or gallop GI: soft, non-tender; bowel sounds normal; no masses,  no organomegaly Extremities: extremities normal, atraumatic, no cyanosis or edema  ECOG PERFORMANCE STATUS: 1 - Symptomatic but completely ambulatory  Blood pressure (!) 122/56, pulse (!) 51, temperature 98.7 F (37.1 C), temperature source Oral, resp. rate 18, height  5\' 7"  (1.702 m), weight 188 lb 14.4 oz (85.7 kg), SpO2 97 %.  LABORATORY DATA: Lab Results  Component Value Date   WBC 5.4 10/20/2017   HGB 15.1 10/20/2017   HCT 45.0 10/20/2017   MCV 90.5 10/20/2017   PLT 92 (L) 10/20/2017      Chemistry      Component Value Date/Time   NA 141 04/21/2017 0823   K 4.0 04/21/2017 0823   CL 106 01/14/2017 0850   CL 106 09/30/2012 0949   CO2 25 04/21/2017 0823   BUN 20.2 04/21/2017 0823   CREATININE 0.9 04/21/2017 0823      Component Value Date/Time   CALCIUM 9.6 04/21/2017 0823   ALKPHOS 61 04/21/2017 0823   AST 22 04/21/2017 0823   ALT 36 04/21/2017 0823   BILITOT 0.99 04/21/2017 0823     BONE MARROW REPORT FINAL DIAGNOSIS RADIOGRAPHIC STUDIES: No results found.  ASSESSMENT AND PLAN:  This is a very pleasant 77 years old white male with idiopathic thrombocytopenic purpura who is currently on observation.  CBC today showed platelet count of 92,000. I recommended for the patient to continue in observation for now.  I will see him back for follow-up visit in 6 months for evaluation with repeat CBC and LDH. I advised the patient to call me immediately if he has any significant bleeding issues, bruises or ecchymosis or if his platelets count to be less than 50,000. He was advised to call immediately if he has any concerning symptoms in the interval. All questions were answered. The patient knows to call the clinic with any problems, questions or concerns. We can certainly see the patient much sooner if necessary.  Disclaimer: This note was dictated with voice recognition software. Similar sounding words can inadvertently be transcribed and may not be corrected upon review.

## 2017-11-09 ENCOUNTER — Other Ambulatory Visit: Payer: Self-pay | Admitting: Internal Medicine

## 2017-11-09 DIAGNOSIS — J45991 Cough variant asthma: Secondary | ICD-10-CM

## 2017-11-14 DIAGNOSIS — C44622 Squamous cell carcinoma of skin of right upper limb, including shoulder: Secondary | ICD-10-CM | POA: Diagnosis not present

## 2017-11-14 DIAGNOSIS — L570 Actinic keratosis: Secondary | ICD-10-CM | POA: Diagnosis not present

## 2017-11-14 DIAGNOSIS — L820 Inflamed seborrheic keratosis: Secondary | ICD-10-CM | POA: Diagnosis not present

## 2017-11-17 ENCOUNTER — Ambulatory Visit (INDEPENDENT_AMBULATORY_CARE_PROVIDER_SITE_OTHER): Payer: Medicare Other | Admitting: Internal Medicine

## 2017-11-17 ENCOUNTER — Encounter: Payer: Self-pay | Admitting: Internal Medicine

## 2017-11-17 VITALS — BP 124/72 | HR 60 | Ht 67.0 in | Wt 186.8 lb

## 2017-11-17 DIAGNOSIS — I25119 Atherosclerotic heart disease of native coronary artery with unspecified angina pectoris: Secondary | ICD-10-CM | POA: Diagnosis not present

## 2017-11-17 DIAGNOSIS — J45991 Cough variant asthma: Secondary | ICD-10-CM | POA: Diagnosis not present

## 2017-11-17 MED ORDER — MONTELUKAST SODIUM 10 MG PO TABS: 10.0000 mg | ORAL_TABLET | Freq: Every day | ORAL | 11 refills | Status: DC

## 2017-11-17 NOTE — Assessment & Plan Note (Addendum)
07/22/2016    > try reduce symbicort to 80 2bid  - FENO 07/15/2016  =   42 on sym 160 2bid  - 07/30/2016  After extensive coaching HFA effectiveness =    90% > continue symb 80 2bid  - Sinus CT 07/31/2016 >>> neg - FENO 10/10/2016  =   12 during flare while on symb 80 2bid - PFT's  10/17/2016  FEV1 2.40 (89 % ) ratio 87  p 0 % improvement from saba p symb 80 x 2 prior to study with DLCO  60/58 % corrects to 90 % for alv volume and erv =50%  - Allergy profile 10/17/2016 >  Eos 0.0 /  IgE 55 Rast pos dust only   - 10/17/2016  Added singulair 10 mg  - HRCT chest 11/2016  -NSIP Changes/Bronchiectasis noted  - 12/12/2016 spirometry s obst with active symptoms >> trial off symbicort as not convinced helping - 01/13/17 trial of dymista > improved 05/19/2017 but did not continue it due to cost   Has finally turned the corner so ok to try off ppi.  I had an extended discussion with the patient reviewing all relevant studies completed to date and  lasting 15 to 20 minutes of a 25 minute visit on the following ongoing concerns:   NB the  ramp to expected improvement in symptoms from an empiric trial of PPI (and for that matter, worsening, if a chronic effective medication is stopped)  can be measured in weeks, not days, a common misconception because this is not the same as treating heartburn (no immediate cause and effect relationship)  so that response to therapy or lack thereof can be very difficult to assess especially if the patient is not adherent to the treatment plan which includes dietary restrictions, which he should continue   If does great into the summer ok to d/c the singulair next    Each maintenance medication was reviewed in detail including most importantly the difference between maintenance and as needed and under what circumstances the prns are to be used.  Please see AVS for specific  Instructions which are unique to this visit and I personally typed out  which were reviewed in detail in writing  with the patient and a copy provided.

## 2017-11-17 NOTE — Progress Notes (Signed)
Brief patient profile:  78   yowm never smoker with ankylosing spondilitis  with new onset variable  cough since Oct 2016 comes and goes s background of asthma but with background of nasal allergies for which took shots and moved residence in 1980s  >  No further problems except nasal drip /[place]nee[place] then fine when stopped going there.    History of Present Illness  Congestion in chest x Oct 2016 worse in evenings with mostly dry coughing and much worse x sev weeks > [facility] eval on 07/15/16 cough for a week/ albterol and pred no better  rec Advair one puff twice per day until sample completed >> call if you feel you need a refill after this Zithromax 250 mg pill >> 2 pills on day 1, then 1 pill daily for next four days Albuterol 2 puffs every 4 to 6 hours as needed for cough, wheeze, or chest congestion Finish course of prednisone    07/22/2016 acute extended ov/Arlind Klingerman re:  Refractory cough > sob on advair and pred taper and getting worse Chief Complaint  Patient presents with  . Acute Visit    Increased SOB and cough are not improving since acute ov here 07/15/16. His cough has been occ prod with light colored sputum.  He states that his breathing does okay in the am's and then gets worse in the evenings.   on advair dpi/ benzoate and took pred 10 mg one tablet  This am and cough seems ok when he wakes up and progressively worse with Minimal actual sputum production as the day goes on and then does not really bother him while sleeping.. Shaking on details of care/ names of meds/ no better with saba  rec Take delsym two tsp every 12 hours and supplement if needed with  tramadol 50 mg up to 1 every 4 hours  .   Symbicort 80 Take 2 puffs first thing in am and then another 2 puffs about 12 hours later - if helping refill it Only use your albuterol (ventolin) inhaler  As long as coughing at all:  Try prilosec otc 20mg   Take 30-60 min before first meal of the day  and Pepcid ac (famotidine) 20 mg one @  bedtime until cough is completely gone for at least a week without the need for cough suppression GERD diet    07/30/2016 acute extended ov/Frenchie Dangerfield re: refractory cough since late Nov 2017  Chief Complaint  Patient presents with  . Acute Visit    Pt c/o increased cough and congestion for the past 2 wks. He states symptoms get worse in the evening. Cough is prod in the am's with thick, clear sputum.  He states he can not lie down flat due to cough and cough sometimes waked him up. He also c/o wheezing.   brought all his meds, not using symb 2bid per count, only missing 11 tramadol "it's for pain, right? "  Some better while on pred, worse since finished it/ only taking prilosec otc 20 mg qam / is taking pepcid 20 mg hs  Not using any saba "it's for emergencies, right?"  rec Prednisone 10 mg take  4 each am x 2 days,   2 each am x 2 days,  1 each am x 2 days and stop  Increase the prilosec ac to where you take x 2 30-60 mn before breakfast  And continue the pepcid ac 20mg  in evening  Continue symbicort 80 (  same as dulera 100 sample) Take 2 puffs first thing in am and then another 2 puffs about 12 hours later.  Only use your albuterol as a rescue medication  For cough use mucinex dm 1200 mg every 12 hours and supplement with the tramadol up to 2 every 4 hours until no cough x 3 days  Please see patient coordinator before you leave today  to schedule sinus CT    08/21/16  Restart Symbicort 2 puffs Twice daily  .  Restart Prilosec 20mg  daily before meal  Restart Pepcid 20mg  At bedtime   May use Zyrtec 10mg  At bedtime  As needed  Drainage  Zpack take as directed.  Mucinex DM Twice daily  As needed  Cough/congestion .     10/10/2016 acute extended ov/Tyee Vandevoorde re:  uacs vs asthma  maint on symb 80 2bid / gerd rx with otcs only  Chief Complaint  Patient presents with  . Acute Visit    Pt c/o cough and increased SOB x 1 wk, and progressively worse over the past  3-4 days. His cough is prod with thick, clear sputum. He states can not lie down flat without cough.   sensation of something stuck never gone away since = oct 2016 then acutely worse sob/ cough x 1 week prior to OV  And not better p saba Does not have mucinex dm / no zyrtec  rec First take delsym two tsp every 12 hours and supplement if needed with  tramadol 50 mg up to 2 every 4 hours to suppress the urge to cough at all or even clear your throat. Swallowing water or using ice chips/non mint and menthol containing candies (such as lifesavers or sugarless jolly ranchers) are also effective.  You should rest your voice and avoid activities that you know make you cough. Once you have eliminated the cough for 3 straight days try reducing the tramadol first,  then the delsym as tolerated.   Prednisone 10 mg take  4 each am x 2 days,   2 each am x 2 days,  1 each am x 2 days and stop (this is to eliminate allergies and inflammation from coughing) Protonix (pantoprazole) Take 30-60 min before first meal of the day and Pepcid 20 mg one bedtime plus chlorpheniramine 4 mg x 2 at bedtime (both available over the counter)  until cough is completely gone for at least a week without the need for cough suppression For drainage / throat tickle try take CHLORPHENIRAMINE  4 mg - take one every 4 hours as needed - available over the counter- may cause drowsiness so start with just a bedtime dose or two and see how you tolerate it before trying in daytime   GERD diet  Stop prilosec/ mucinex  Please remember to go to the lab  department downstairs in the basement  for your tests - we will call you with the results when they are available.  Please schedule a follow up office visit in 2 weeks, sooner if needed  with all medications /inhalers/ solutions in hand so we can verify exactly what you are taking. This includes all medications from all doctors and over the counters Add did not got to lab as rec    10/17/2016  f/u  ov/Ahnaf Caponi re: intermittent cough x oct 2016 def gets better on prednisone / sense of chest congestion but no excess mucus Chief Complaint  Patient presents with  . Follow-up    PFT's done today.  symptoms of  daily cough  worse p supper Did not bring meds as requested / not clear he even read them when reading them back line by line  Not using any albuterol "doesn't help"  Not able to lie down due to cough x month x no better even while on prednisone though "80% better" on pred vs off it/ no longer using cpap rec Add singulair each evening 10 daily trial basis  Please remember to go to the lab  department downstairs in the basement  for your tests - we will call you with the results when they are available. See Tammy NP in 4  weeks with all your medications, same page with your medication use she will arrange follow up with me or with allergy depending on your tests  Add : consider adding flutter if doesn't have one and HRCT looking for bronchiectasis      11/15/16 NP eval May stop  Tramadol  Use Delsym 2 tsp every 12hr for coughing .  Use Mucinex Twice daily  For congestion and mucus.  Call back with formulary options for Symbicort.  Follow med calendar closely and bring to each visit     12/12/2016  f/u ov/Ezequiel Macauley re:  Chronic cough/ bronchiectasis / not convinced symb 80 helping  Chief Complaint  Patient presents with  . Follow-up    Breathing is "fine". He still c/o chest congestion. He is not coughing much but when he does it is prod with cream colored sputum.    rec Stop mucinex / delsym and symbicort and just use ventolin as needed  Work on inhaler technique:  relax and gently blow all the way out then take a nice smooth deep breath back in, triggering the inhaler at same time you start breathing in.  Hold for up to 5 seconds if you can. Blow out thru nose. Rinse and gargle with water when done Add mucinex dm 1200 mg twice daily and flutter valve as much as you can  Flonase twice  daily each nostril See calendar for specific medication instruction     01/13/2017  f/u ov/Lory Nowaczyk re: chonic  cough since oct 2016 / documented bronchiectasis / not using med calendar  Chief Complaint  Patient presents with  . Follow-up    Still has some prod cough with clear sputum.   at hs takes 2 h1 and sleeps fine, no am flares but as soon as stirs experiences sense of pnds s  excess/ purulent sputum or mucus plugs  . No need for more saba off symbicort  rec Try dymista   05/19/2017  f/u ov/Atleigh Gruen re: chronic cough 2016  Chief Complaint  Patient presents with  . Follow-up    Pt states that he has been doing good. Denies any cough, CP, or SOB.  Not limited by breathing from desired activities  / more by back  Not using dymista now and cut the H1 down to one hs (ad libbing on maint rx but really doing better than he has x  2 years! No need for saba  rec No change rx    11/17/2017  f/u ov/Rosario Kushner re:  Cough x 2016 better  And ? Whether to continue ppi/ singulair  Chief Complaint  Patient presents with  . Follow-up    no voiced concerns  Dyspnea:  Not limited by breathing from desired activities   Cough: none Sleep: ok SABA use: none  Pain in R heel walking x months   No obvious day to day or daytime  variability or assoc excess/ purulent sputum or mucus plugs or hemoptysis or cp or chest tightness, subjective wheeze or overt sinus or hb symptoms. No unusual exposure hx or h/o childhood pna/ asthma or knowledge of premature birth.  Sleeping ok flat without nocturnal  or early am exacerbation  of respiratory  c/o's or need for noct saba. Also denies any obvious fluctuation of symptoms with weather or environmental changes or other aggravating or alleviating factors except as outlined above   Current Allergies, Complete Past Medical History, Past Surgical History, Family History, and Social History were reviewed in [employer] record.  ROS  The following are not  active complaints unless bolded Hoarseness, sore throat, dysphagia, dental problems, itching, sneezing,  nasal congestion or discharge of excess mucus or purulent secretions, ear ache,   fever, chills, sweats, unintended wt loss or wt gain, classically pleuritic or exertional cp,  orthopnea pnd or leg swelling, presyncope, palpitations, abdominal pain, anorexia, nausea, vomiting, diarrhea  or change in bowel habits or change in bladder habits, change in stools or change in urine, dysuria, hematuria,  rash, arthralgias, visual complaints, headache, numbness, weakness or ataxia or problems with walking or coordination,  change in mood/affect or memory.        Current Meds  Medication Sig  . amLODipine (NORVASC) 5 MG tablet Take 5 mg by mouth daily.  [name] Apoaequorin (PREVAGEN PO) Take 1 tablet by mouth daily.   [name] aspirin 81 MG EC tablet Take 81 mg by mouth daily.   . chlorpheniramine (CHLOR-TRIMETON) 4 MG tablet Take 8 mg by mouth at bedtime.   . famotidine (PEPCID) 20 MG tablet Take 20 mg by mouth at bedtime.  . montelukast (SINGULAIR) 10 MG tablet TAKE 1 TABLET BY MOUTH ONCE DAILY AT BEDTIME  . pantoprazole (PROTONIX) 40 MG tablet TAKE 1 TABLET BY MOUTH ONCE DAILY ,  30  TO  60  MINUTES  BEFORE  FIRST  MEAL  OF  THE  DAY  . Respiratory Therapy Supplies (FLUTTER) DEVI Use as directed  . simvastatin (ZOCOR) 20 MG tablet TAKE 1 TABLET EVERY DAY WITH BREAKFAST (NEED TO SCHEDULE YEARLY APPT W/DR COOPER FOR MAY BEFORE ANY REFILLS [phone])         Pulmonary tests CT angio chest 11/30/15 >> motion artifact, mosaic pattern, calcified granuloma SNIFF test 11/30/15 >> elevated Rt diaphragm, but has b/l diaphragm movement  Cardiac tests 10/16/15 Doppler legs b/l >> no DVT 11/30/15 Echo >> EF 65 to 21%, grade 1 diastolic CHF  Sleep tests HST 12/21/15 >> AHI 44.5, SpO2 low 64% Auto CPAP 03/02/16 to 03/31/16 >> used on 26 of 30 nights with average 6 hrs 32 min.  Average AHI 5.5 with median CPAP 8 and 95 th  percentile CPAP 11 cm H2O ONO with CPAP 04/10/16 >> test time 6 hrs 30 min.  Mean SpO2 93.51%, low SpO2 77%.  Spent 7 min with SpO2 < 88%   Past medical history Ankylosing spondylitis, Allergies, CAD, HLD, HTN, Nephrolithiasis, Malaria      Physical Exam   amb wm easily perplexed with details of care  11/17/2017         186  05/20/2017       190  01/15/2017         183  12/12/2016         186  10/17/2016         183  10/10/2016         187  07/30/2016     191  07/22/16 194 lb (88 kg)  07/15/16 196 lb (88.9 kg)  07/12/16 192 lb 9.6 oz (87.4 kg)     Vital signs reviewed - Note on arrival 02 sats  97% on RA      HEENT: nl dentition, turbinates bilaterally, and oropharynx. Nl external ear canals without cough reflex   NECK :  without JVD/Nodes/TM/ nl carotid upstrokes bilaterally   LUNGS: no acc muscle use,  Nl contour chest which is clear to A and P bilaterally without cough on insp or exp maneuvers   CV:  RRR  no s3 or murmur or increase in P2, and no edema   ABD:  soft and nontender with nl inspiratory excursion in the supine position. No bruits or organomegaly appreciated, bowel sounds nl  MS:  Nl gait/ ext warm without deformities, calf tenderness, cyanosis or clubbing No obvious joint restrictions   SKIN: warm and dry without lesions    NEURO:  alert, approp, nl sensorium with  no motor or cerebellar deficits apparent.            Assessment/Plan

## 2017-11-17 NOTE — Patient Instructions (Addendum)
In the event the cough returns on your present meds, resume Prilosec 20 mg Take 30-60 min before first meal of the day   If you are doing great into the summer months ok to try off the montelukast to see what difference if any this makes and if worse sinus symptoms or cough then resume daily   See your orthopedic doctor about your heal ? Developing plantar fasciitis?     See calendar for specific medication instructions and bring it back for each and every office visit for every healthcare provider you see.  Without it,  you may not receive the best quality medical care that we feel you deserve.  You will note that the calendar groups together  your maintenance  medications that are timed at particular times of the day.  Think of this as your checklist for what your doctor has instructed you to do until your next evaluation to see what benefit  there is  to staying on a consistent group of medications intended to keep you well.  The other group at the bottom is entirely up to you to use as you see fit  for specific symptoms that may arise between visits that require you to treat them on an as needed basis.  Think of this as your action plan or "what if" list.   Separating the top medications from the bottom group is fundamental to providing you adequate care going forward.    Please schedule a follow up visit in 6 months but call sooner if needed

## 2017-12-05 ENCOUNTER — Emergency Department (HOSPITAL_COMMUNITY): Payer: Medicare Other

## 2017-12-05 ENCOUNTER — Observation Stay (HOSPITAL_COMMUNITY)
Admission: EM | Admit: 2017-12-05 | Discharge: 2017-12-05 | Disposition: A | Discharge status: Home / Self Care | Payer: Medicare Other | Attending: Internal Medicine | Admitting: Internal Medicine

## 2017-12-05 ENCOUNTER — Other Ambulatory Visit: Payer: Self-pay

## 2017-12-05 DIAGNOSIS — J849 Interstitial pulmonary disease, unspecified: Secondary | ICD-10-CM | POA: Diagnosis present

## 2017-12-05 DIAGNOSIS — Z808 Family history of malignant neoplasm of other organs or systems: Secondary | ICD-10-CM | POA: Diagnosis not present

## 2017-12-05 DIAGNOSIS — I2510 Atherosclerotic heart disease of native coronary artery without angina pectoris: Secondary | ICD-10-CM | POA: Diagnosis present

## 2017-12-05 DIAGNOSIS — K219 Gastro-esophageal reflux disease without esophagitis: Secondary | ICD-10-CM | POA: Insufficient documentation

## 2017-12-05 DIAGNOSIS — Z7982 Long term (current) use of aspirin: Secondary | ICD-10-CM | POA: Insufficient documentation

## 2017-12-05 DIAGNOSIS — IMO0001 Reserved for inherently not codable concepts without codable children: Secondary | ICD-10-CM | POA: Insufficient documentation

## 2017-12-05 DIAGNOSIS — Z87442 Personal history of urinary calculi: Secondary | ICD-10-CM | POA: Diagnosis not present

## 2017-12-05 DIAGNOSIS — M1990 Unspecified osteoarthritis, unspecified site: Secondary | ICD-10-CM | POA: Diagnosis not present

## 2017-12-05 DIAGNOSIS — Z8613 Personal history of malaria: Secondary | ICD-10-CM | POA: Insufficient documentation

## 2017-12-05 DIAGNOSIS — Z8261 Family history of arthritis: Secondary | ICD-10-CM | POA: Diagnosis not present

## 2017-12-05 DIAGNOSIS — I25110 Atherosclerotic heart disease of native coronary artery with unstable angina pectoris: Secondary | ICD-10-CM | POA: Diagnosis not present

## 2017-12-05 DIAGNOSIS — Z833 Family history of diabetes mellitus: Secondary | ICD-10-CM | POA: Insufficient documentation

## 2017-12-05 DIAGNOSIS — I252 Old myocardial infarction: Secondary | ICD-10-CM | POA: Diagnosis not present

## 2017-12-05 DIAGNOSIS — E785 Hyperlipidemia, unspecified: Secondary | ICD-10-CM | POA: Diagnosis not present

## 2017-12-05 DIAGNOSIS — Z79899 Other long term (current) drug therapy: Secondary | ICD-10-CM | POA: Diagnosis not present

## 2017-12-05 DIAGNOSIS — G4733 Obstructive sleep apnea (adult) (pediatric): Secondary | ICD-10-CM | POA: Insufficient documentation

## 2017-12-05 DIAGNOSIS — Z825 Family history of asthma and other chronic lower respiratory diseases: Secondary | ICD-10-CM | POA: Diagnosis not present

## 2017-12-05 DIAGNOSIS — I200 Unstable angina: Secondary | ICD-10-CM | POA: Diagnosis not present

## 2017-12-05 DIAGNOSIS — R0602 Shortness of breath: Secondary | ICD-10-CM | POA: Diagnosis not present

## 2017-12-05 DIAGNOSIS — I25119 Atherosclerotic heart disease of native coronary artery with unspecified angina pectoris: Secondary | ICD-10-CM | POA: Diagnosis not present

## 2017-12-05 DIAGNOSIS — I208 Other forms of angina pectoris: Secondary | ICD-10-CM | POA: Diagnosis present

## 2017-12-05 DIAGNOSIS — I700 Atherosclerosis of aorta: Secondary | ICD-10-CM | POA: Diagnosis not present

## 2017-12-05 DIAGNOSIS — I2089 Other forms of angina pectoris: Secondary | ICD-10-CM | POA: Diagnosis present

## 2017-12-05 DIAGNOSIS — M459 Ankylosing spondylitis of unspecified sites in spine: Secondary | ICD-10-CM | POA: Diagnosis not present

## 2017-12-05 DIAGNOSIS — Z888 Allergy status to other drugs, medicaments and biological substances status: Secondary | ICD-10-CM | POA: Insufficient documentation

## 2017-12-05 DIAGNOSIS — J449 Chronic obstructive pulmonary disease, unspecified: Secondary | ICD-10-CM | POA: Diagnosis not present

## 2017-12-05 DIAGNOSIS — F419 Anxiety disorder, unspecified: Secondary | ICD-10-CM | POA: Insufficient documentation

## 2017-12-05 DIAGNOSIS — D693 Immune thrombocytopenic purpura: Secondary | ICD-10-CM | POA: Diagnosis not present

## 2017-12-05 DIAGNOSIS — I10 Essential (primary) hypertension: Secondary | ICD-10-CM | POA: Diagnosis not present

## 2017-12-05 DIAGNOSIS — Z86010 Personal history of colonic polyps: Secondary | ICD-10-CM | POA: Insufficient documentation

## 2017-12-05 DIAGNOSIS — D696 Thrombocytopenia, unspecified: Secondary | ICD-10-CM | POA: Insufficient documentation

## 2017-12-05 DIAGNOSIS — K760 Fatty (change of) liver, not elsewhere classified: Secondary | ICD-10-CM | POA: Insufficient documentation

## 2017-12-05 DIAGNOSIS — R0789 Other chest pain: Secondary | ICD-10-CM

## 2017-12-05 DIAGNOSIS — R079 Chest pain, unspecified: Secondary | ICD-10-CM | POA: Diagnosis not present

## 2017-12-05 DIAGNOSIS — Z955 Presence of coronary angioplasty implant and graft: Secondary | ICD-10-CM | POA: Diagnosis not present

## 2017-12-05 DIAGNOSIS — J309 Allergic rhinitis, unspecified: Secondary | ICD-10-CM | POA: Insufficient documentation

## 2017-12-05 LAB — BASIC METABOLIC PANEL
Anion gap: 10 (ref 5–15)
BUN: 21 mg/dL — AB (ref 6–20)
CHLORIDE: 107 mmol/L (ref 101–111)
CO2: 23 mmol/L (ref 22–32)
Calcium: 8.9 mg/dL (ref 8.9–10.3)
Creatinine, Ser: 0.95 mg/dL (ref 0.61–1.24)
GFR calc Af Amer: >60 mL/min (ref >60)
Glucose, Bld: 134 mg/dL — ABNORMAL HIGH (ref 65–99)
POTASSIUM: 3.6 mmol/L (ref 3.5–5.1)
Sodium: 140 mmol/L (ref 135–145)

## 2017-12-05 LAB — CBC
HEMATOCRIT: 44.1 % (ref 39.0–52.0)
Hemoglobin: 14.6 g/dL (ref 13.0–17.0)
MCH: 30.1 pg (ref 26.0–34.0)
MCHC: 33.1 g/dL (ref 30.0–36.0)
MCV: 90.9 fL (ref 78.0–100.0)
Platelets: 83 10*3/uL — ABNORMAL LOW (ref 150–400)
RBC: 4.85 MIL/uL (ref 4.22–5.81)
RDW: 14.5 % (ref 11.5–15.5)
WBC: 5.2 10*3/uL (ref 4.0–10.5)

## 2017-12-05 LAB — I-STAT TROPONIN, ED
TROPONIN I, POC: 0 ng/mL (ref 0.00–0.08)
Troponin i, poc: 0 ng/mL (ref 0.00–0.08)

## 2017-12-05 LAB — D-DIMER, QUANTITATIVE: D-Dimer, Quant: 0.42 ug/mL-FEU (ref 0.00–0.50)

## 2017-12-05 MED ORDER — ONDANSETRON HCL 4 MG/2ML IJ SOLN: 4.0000 mg | Freq: Four times a day (QID) | INTRAMUSCULAR | Status: DC | PRN

## 2017-12-05 MED ORDER — FAMOTIDINE 20 MG PO TABS: 20.0000 mg | ORAL_TABLET | Freq: Every day | ORAL | Status: DC

## 2017-12-05 MED ORDER — ASPIRIN EC 81 MG PO TBEC
81.0000 mg | DELAYED_RELEASE_TABLET | Freq: Every day | ORAL | Status: DC
  Filled 2017-12-05: qty 1

## 2017-12-05 MED ORDER — MONTELUKAST SODIUM 10 MG PO TABS
10.0000 mg | ORAL_TABLET | Freq: Every day | ORAL | Status: DC
  Filled 2017-12-05: qty 1

## 2017-12-05 MED ORDER — ACETAMINOPHEN 325 MG PO TABS: 650.0000 mg | ORAL_TABLET | ORAL | Status: DC | PRN

## 2017-12-05 MED ORDER — AMLODIPINE BESYLATE 5 MG PO TABS
5.0000 mg | ORAL_TABLET | Freq: Every day | ORAL | Status: DC
  Administered 2017-12-05: 5 mg via ORAL
  Filled 2017-12-05: qty 1

## 2017-12-05 MED ORDER — PANTOPRAZOLE SODIUM 40 MG PO TBEC
40.0000 mg | DELAYED_RELEASE_TABLET | Freq: Every day | ORAL | Status: DC
  Filled 2017-12-05: qty 1

## 2017-12-05 MED ORDER — ENOXAPARIN SODIUM 40 MG/0.4ML ~~LOC~~ SOLN: 40.0000 mg | SUBCUTANEOUS | Status: DC

## 2017-12-05 MED ORDER — SIMVASTATIN 20 MG PO TABS
20.0000 mg | ORAL_TABLET | Freq: Every day | ORAL | Status: DC
  Filled 2017-12-05 (×2): qty 1

## 2017-12-05 MED ORDER — NITROGLYCERIN 0.4 MG SL SUBL: 0.4000 mg | SUBLINGUAL_TABLET | SUBLINGUAL | Status: DC | PRN

## 2017-12-05 NOTE — H&P (Signed)
History and Physical:    [name]   DEY:[id number] DOB: [birth year] DOA: 12/05/2017  Referring MD/provider: Geanie Kenning PCP: [name], MD   Patient coming from: Home  Chief Complaint: woken up from chest pain at 4 AM   History of Present Illness:   [name] is an 77 y.o. male with past medical history significant for coronary artery disease status post stents 2 in 2007 was in his usual state of reasonably good health until 4 AM this morning when he woke with sudden onset of sharp constant chest pain associated with diaphoresis and shortness of breath. Patient states that he felt well when he went to bed last night and had been lifting 5 pound buckets without difficulty the week before. Patient took 325 aspirin when the chest pain would not go away and called EMS. Patient does not remember getting nitroglycerin however notes pain was continuous and lasted more than an hour and did not ease off until he was on his way to the ED. Patient states chest pain has not recurred since he's been in the ED. At present he is comfortable, denies chest pain shortness of breath or diaphoresis.  Patient notes that this chest pain is quite different from what he had had prior to his stents being placed. He describes that as being more diffuse over his left chest. Patient describes this pain as being sharp and focal and as increasing with inspiration. It does not increase or decrease with sitting up or lying down. Patient has not had a URI lately.   ED Course:  The patient was noted to be chest pain-free. EKG was without any acute ST-T wave changes. Troponin was negative. Patient is now being admitted for further workup.  ROS:   ROS   Review of Systems: General: No fever, chills, weight changes Skin: No rashes, lesions, wounds Respiratory:No cough,, shortness of breath, hemoptysis Cardiovascular: No palpitations, chest pain GI: No nausea, vomiting, diarrhea,  constipation GU: No dysuria, increased frequency CNS: No numbness, dizziness, headache Musculoskeletal: No back pain, joint pain Blood/lymphatics: No easy bruising, bleeding Mood/affect: No anxiety/depression    Past Medical History:   Past Medical History:  Diagnosis Date  . Abnormal chest x-ray   . Allergic rhinitis   . Ankylosing spondylitis ([place])   . Anxiety   . Arthritis   . Colonic polyp   . Coronary atherosclerosis of native coronary artery   . Cough   . Dizziness 10/21/2016  . Ganglion cyst of wrist    left  . Heart attack ([place]) 2008  . Hemorrhoids   . History of malaria   . Hyperlipidemia    with low HDL  . Hypertension   . Kidney stone   . Other disorder of muscle, ligament, and fascia   . Rotator cuff tear   . Swelling of lower extremity 10/16/2015    Past Surgical History:   Past Surgical History:  Procedure Laterality Date  . HEMORRHOID SURGERY  10/10   in [place]  . LEFT HEART CATH AND CORONARY ANGIOGRAPHY N/A 01/14/2017   Procedure: Left Heart Cath and Coronary Angiography;  Surgeon: [name], MD;  Location: [place] [facility];  Service: Cardiovascular;  Laterality: N/A;  . LEFT HEART CATHETERIZATION WITH CORONARY ANGIOGRAM N/A 09/03/2012   Procedure: LEFT HEART CATHETERIZATION WITH CORONARY ANGIOGRAM;  Surgeon: [name], MD;  [facility] C[facility];  Service: Cardiovascular;  Laterality: N/A;  . ROTATOR CUFF REPAIR     bilat by  Dr. French Ana    Social History:   Social History   Socioeconomic History  . Marital status: Married    Spouse name: [name]  . Number of children: 2  . Years of education: Not on file  . Highest education level: Not on file  Occupational History  . Not on file  Social Needs  . Financial resource strain: Not on file  . Food insecurity:    Worry: Not on file    Inability: Not on file  . Transportation needs:    Medical: Not on file    Non-medical: Not on file  Tobacco Use  . Smoking status: Never Smoker  .  Smokeless tobacco: Never Used  Substance and Sexual Activity  . Alcohol use: No    Comment: occ 1 beer, wine occ  . Drug use: No  . Sexual activity: Yes  Lifestyle  . Physical activity:    Days per week: Not on file    Minutes per session: Not on file  . Stress: Not on file  Relationships  . Social connections:    Talks on phone: Not on file    Gets together: Not on file    Attends religious service: Not on file    Active member of club or organization: Not on file    Attends meetings of clubs or organizations: Not on file    Relationship status: Not on file  . Intimate partner violence:    Fear of current or ex partner: Not on file    Emotionally abused: Not on file    Physically abused: Not on file    Forced sexual activity: Not on file  Other Topics Concern  . Not on file  Social History Narrative  . Not on file    Allergies   Tramadol  Family history:   Family History  Problem Relation Age of Onset  . Cancer Mother        melanoma  . Emphysema Mother   . Rheum arthritis Mother   . Diabetes Mother     Current Medications:   Prior to Admission medications   Medication Sig Start Date End Date Taking? Authorizing Provider  amLODipine (NORVASC) 5 MG tablet Take 5 mg by mouth daily.   Yes [provider]  Apoaequorin (PREVAGEN PO) Take 1 tablet by mouth daily.    Yes [provider]  aspirin 81 MG EC tablet Take 81 mg by mouth daily.    Yes [provider]  chlorpheniramine (CHLOR-TRIMETON) 4 MG tablet Take 8 mg by mouth at bedtime.    Yes [provider]  famotidine (PEPCID) 20 MG tablet Take 20 mg by mouth at bedtime.   Yes [provider]  montelukast (SINGULAIR) 10 MG tablet Take 1 tablet (10 mg total) by mouth at bedtime. 11/17/17  Yes [name], MD  pantoprazole (PROTONIX) 40 MG tablet TAKE 1 TABLET BY MOUTH ONCE DAILY ,  30  TO  60  MINUTES  BEFORE  FIRST  MEAL  OF  THE  DAY 11/10/17  Yes [name], MD   simvastatin (ZOCOR) 20 MG tablet TAKE 1 TABLET EVERY DAY WITH BREAKFAST (NEED TO SCHEDULE YEARLY APPT W/DR COOPER FOR MAY BEFORE ANY REFILLS [phone]) 01/15/17  Yes [name], MD  Respiratory Therapy Supplies (FLUTTER) DEVI Use as directed 12/12/16   [name], MD    Physical Exam:   Vitals:   12/05/17 0800 12/05/17 0830 12/05/17 0900 12/05/17 1030  BP: [name])  155/61 (!) 148/68 (!) 143/81 (!) 148/76  Pulse: (!) 56 (!) 54 (!) 53 (!) 47  Resp: 16 11 15 13   Temp:      TempSrc:      SpO2: 96% 95% 96% 97%  Weight:      Height:         Physical Exam: Blood pressure (!) 148/76, pulse (!) 47, temperature 98.6 F (37 C), resp. rate 13, height 5\' 7"  (1.702 m), weight 83.5 kg (184 lb), SpO2 97 %. Gen:  well-appearing gentleman lying in bed flat with no distress Eyes: Sclerae anicteric. Conjunctiva mildly injected. Neck: Supple, no jugular venous distention. Chest: Moderately good air entry bilaterally with decreased BS with few rales at right base  CV: Distant, regular, no audible murmurs. Abdomen: NABS, soft, nondistended, nontender. No tenderness to light or deep palpation. No rebound, no guarding. Extremities: No edema.  Skin: Warm and dry. No rashes, lesions or wounds. Neuro: Alert and oriented times 3; grossly nonfocal. Psych: Patient is cooperative, logical and coherent with appropriate mood and affect.  Data Review:    Labs: Basic Metabolic Panel: Recent Labs  Lab 12/05/17 0617  NA 140  K 3.6  CL 107  CO2 23  GLUCOSE 134*  BUN 21*  CREATININE 0.95  CALCIUM 8.9   Liver Function Tests: No results for input(s): AST, ALT, ALKPHOS, BILITOT, PROT, ALBUMIN in the last 168 hours. No results for input(s): LIPASE, AMYLASE in the last 168 hours. No results for input(s): AMMONIA in the last 168 hours. CBC: Recent Labs  Lab 12/05/17 0617  WBC 5.2  HGB 14.6  HCT 44.1  MCV 90.9  PLT 83*   Cardiac Enzymes: No results for input(s): CKTOTAL, CKMB, CKMBINDEX,  TROPONINI in the last 168 hours.  BNP (last 3 results) No results for input(s): PROBNP in the last 8760 hours. CBG: No results for input(s): GLUCAP in the last 168 hours.  Urinalysis    Component Value Date/Time   COLORURINE LT. YELLOW 06/04/2011 1024   APPEARANCEUR CLEAR 06/04/2011 1024   LABSPEC 1.025 06/04/2011 1024   PHURINE 6.0 06/04/2011 1024   GLUCOSEU 100 06/04/2011 1024   HGBUR NEGATIVE 06/04/2011 1024   BILIRUBINUR negative 05/19/2017 1149   KETONESUR negative 05/19/2017 1149   KETONESUR NEGATIVE 06/04/2011 1024   PROTEINUR negative 05/19/2017 1149   UROBILINOGEN 0.2 05/19/2017 1149   UROBILINOGEN 0.2 06/04/2011 1024   NITRITE Negative 05/19/2017 1149   NITRITE NEGATIVE 06/04/2011 1024   LEUKOCYTESUR Negative 05/19/2017 1149      Radiographic Studies: [name] 2 View  Result Date: 12/05/2017 CLINICAL DATA:  Chest pain and shortness of breath. EXAM: CHEST - 2 VIEW COMPARISON:  05/30/2017 FINDINGS: Chronic elevation of the right hemidiaphragm with chronic volume loss at the right lung base. Heart size within normal limits. Chronic aortic atherosclerosis. No sign of active infiltrate, mass, effusion or collapse. No pulmonary edema. Ordinary mild degenerative changes affect the spine. IMPRESSION: No acute disease. Chronic elevation of the right hemidiaphragm with chronic volume loss at the right base. Electronically Signed   By: Nelson Chimes M.D.   On: 12/05/2017 07:15    EKG: Independently reviewed. Sinus rhythm at 60, mild intraventricular conduction delay, normal axis, no acute ST-T wave changes at all.   Assessment/Plan:   Principal Problem:   Unstable angina (HCC) Active Problems:   CAD (coronary artery disease)   ILD (interstitial lung disease) ([place])   Essential hypertension  CHEST PAIN Patient with known coronary artery disease 10 years after  stent placement presents with new onset somewhat atypical sounding chest pain at 4 AM. Chest pain either resolved  spontaneously or with nitroglycerin, patient does not remember however he thinks it did last as long as an hour. It has not recurred since he has been here. EKG is entirely normal. Initial troponin is negative. Will admit to rule out acute coronary syndrome, patient already got aspirin 325 this morning, we'll continue his 81 mg daily Will not anticoagulate at present given no chest pain and normal EKG Patient is artery bradycardic at 61 so no need for beta blockers Continue simvastatin I have asked cardiology to see the patient given high pretest probability and then being woken up at 4 AM which is concerning.  HTN Reasonably controlled on amlodipine, we'll continue No beta blockers due to bradycardia  ITP Platelets are stable in the mid 80s  COPD Lungs are without any wheezes or signs of acute exacerbation  GERD Continue pantoprazole     Other information:   DVT prophylaxis: Lovenox ordered. Code Status: Full code. Family Communication: patient states there is no need to call family  Disposition Plan: home Consults called: cardiology Admission status: bservation   The medical decision making on this patient was of high complexity and the patient is at high risk for clinical deterioration, therefore this is a level 3 visit.   Dewaine Oats Tublu Chatterjee [facility]  If 7PM-7AM, please contact night-coverage [email or web address] Password [facility] 12/05/2017, 11:48 AM

## 2017-12-05 NOTE — ED Notes (Signed)
Pt stable and ambulatory for discharge, states understanding follow up.  

## 2017-12-05 NOTE — ED Notes (Signed)
Patient transported to X-ray 

## 2017-12-05 NOTE — Discharge Instructions (Addendum)
Your blood work, chest x-ray and EKG were reassuring today.   Your blood sugar was somewhat elevated today, (blood sugar 134), please have this rechecked by your regular doctor.   Follow up with your cardiologist on May 8th at 8:45AM  Return to the ER if you have any new or concerning symptoms like if you develop chest pain again, have trouble breathing, or have any new or concerning symptoms.

## 2017-12-05 NOTE — ED Triage Notes (Signed)
Pt to ED via EMS with cp started 0500 with sudden sharp l sided cp, non radiating. No other symptoms. Sts feels similar to MI in 2008. Denies n/v, dizziness, and other symptoms. Pain subsided within 15 mins of EMS arrival with no treatment other than home asa. 176/85, 98% room air, RR 18, cbg 135, HR 66 NSR.

## 2017-12-05 NOTE — ED Provider Notes (Signed)
[place] EMERGENCY DEPARTMENT Provider Note   CSN: [id number] Arrival date & time: 12/05/17  1443     History   Chief Complaint Chief Complaint  Patient presents with  . Chest Pain    HPI [name] is a 77 y.o. male.  HPI   [name] is a 77 year old male with a history of CAD (MI 2008 with LAD stent seen by Dr. Burt Knack), hypertension, hyperlipidemia, impaired glucose tolerance, ankylosing spondylitis who presents to the emergency department for evaluation of chest pain.  Patient states that he was woken from sleep around 430 this morning with sharp 7/10 severity left-sided chest pain.  The pain did not radiate.  He states that he also had associated shortness of breath and both of his palms were sweating.  It reports that this was similar to prior symptoms of his MI.  He chewed 324 mg aspirin and EMS was called to his house.  He reports pain lasted approximately 2 hours and is now resolved.  He denies fevers, chills, lightheadedness, dizziness, cough, nausea/vomiting, abdominal pain, numbness, weakness.  He denies history of DVT/PE, leg swelling or calf tenderness, recent surgery or immobilization.  Past Medical History:  Diagnosis Date  . Abnormal chest x-ray   . Allergic rhinitis   . Ankylosing spondylitis ([place])   . Anxiety   . Arthritis   . Colonic polyp   . Coronary atherosclerosis of native coronary artery   . Cough   . Dizziness 10/21/2016  . Ganglion cyst of wrist    left  . Heart attack ([place]) 2008  . Hemorrhoids   . History of malaria   . Hyperlipidemia    with low HDL  . Hypertension   . Kidney stone   . Other disorder of muscle, ligament, and fascia   . Rotator cuff tear   . Swelling of lower extremity 10/16/2015    Patient Active Problem List   Diagnosis Date Noted  . Essential hypertension 05/29/2017  . Abnormal nuclear cardiac imaging test 01/14/2017  . Bronchiectasis without complication ([place]) 15/40/0867  . ILD (interstitial  lung disease) ([place]) 11/27/2016  . Dizziness 10/21/2016  . Cough variant asthma  vs UACS 07/22/2016  . Obstructive sleep apnea 12/01/2015  . Acute bronchitis 11/30/2015  . Swelling of lower extremity 10/16/2015  . Benign mole 02/04/2014  . Impaired glucose tolerance 10/26/2013  . NAFLD (nonalcoholic fatty liver disease) 04/27/2013  . Thrombocytopenia ([place]) 10/21/2012  . Exertional angina (HCC) 09/03/2012  . MALARIA 12/05/2009  . COLONIC POLYPS 12/05/2009  . HYPERCHOLESTEROLEMIA 12/05/2009  . ANXIETY 12/05/2009  . HEMORRHOIDS 12/05/2009  . Allergic rhinitis 12/05/2009  . ANKYLOSING SPONDYLITIS 12/05/2009  . GANGLION CYST, WRIST, LEFT 12/05/2009  . ROTATOR CUFF TEAR 12/05/2009  . OTHER DISORDER OF MUSCLE LIGAMENT AND FASCIA 12/05/2009  . ABNORMAL CHEST XRAY 12/05/2009  . CAD (coronary artery disease) 04/05/2009    Past Surgical History:  Procedure Laterality Date  . HEMORRHOID SURGERY  10/10   in [place]  . LEFT HEART CATH AND CORONARY ANGIOGRAPHY N/A 01/14/2017   Procedure: Left Heart Cath and Coronary Angiography;  Surgeon: [name], MD;  Location: [place] CV LAB;  Service: Cardiovascular;  Laterality: N/A;  . LEFT HEART CATHETERIZATION WITH CORONARY ANGIOGRAM N/A 09/03/2012   Procedure: LEFT HEART CATHETERIZATION WITH CORONARY ANGIOGRAM;  Surgeon: [name], MD;  Location: [facility] CATH LAB;  Service: Cardiovascular;  Laterality: N/A;  . ROTATOR CUFF REPAIR     bilat by Dr. French Ana  Home Medications    Prior to Admission medications   Medication Sig Start Date End Date Taking? Authorizing Provider  amLODipine (NORVASC) 5 MG tablet Take 5 mg by mouth daily.    [provider]  Apoaequorin (PREVAGEN PO) Take 1 tablet by mouth daily.     [provider]  aspirin 81 MG EC tablet Take 81 mg by mouth daily.     [provider]  chlorpheniramine (CHLOR-TRIMETON) 4 MG tablet Take 8 mg by mouth at bedtime.     [provider]    famotidine (PEPCID) 20 MG tablet Take 20 mg by mouth at bedtime.    [provider]  montelukast (SINGULAIR) 10 MG tablet Take 1 tablet (10 mg total) by mouth at bedtime. 11/17/17   [name], MD  pantoprazole (PROTONIX) 40 MG tablet TAKE 1 TABLET BY MOUTH ONCE DAILY ,  30  TO  60  MINUTES  BEFORE  FIRST  MEAL  OF  THE  DAY 11/10/17   [name], MD  Respiratory Therapy Supplies (FLUTTER) DEVI Use as directed 12/12/16   [name], MD  simvastatin (ZOCOR) 20 MG tablet TAKE 1 TABLET EVERY DAY WITH BREAKFAST (NEED TO SCHEDULE YEARLY APPT W/DR COOPER FOR [place] BEFORE ANY REFILLS [phone]) 01/15/17   [name], MD    Family History Family History  Problem Relation Age of Onset  . Cancer Mother        melanoma  . Emphysema Mother   . Rheum arthritis Mother   . Diabetes Mother     Social History Social History   Tobacco Use  . Smoking status: Never Smoker  . Smokeless tobacco: Never Used  Substance Use Topics  . Alcohol use: No    Comment: occ 1 beer, wine occ  . Drug use: No     Allergies   Tramadol   Review of Systems Review of Systems  Constitutional: Positive for diaphoresis. Negative for chills and fever.  Eyes: Negative for visual disturbance.  Respiratory: Positive for shortness of breath. Negative for cough, chest tightness and wheezing.   Cardiovascular: Positive for chest pain. Negative for leg swelling.  Gastrointestinal: Negative for abdominal pain, nausea and vomiting.  Genitourinary: Negative for dysuria.  Musculoskeletal: Negative for back pain.  Skin: Negative for rash.  Neurological: Negative for dizziness, weakness, light-headedness and numbness.  Psychiatric/Behavioral: Negative for agitation.     Physical Exam Updated Vital Signs BP 132/61   Pulse (!) 59   Temp 98.6 F (37 C)   Resp (!) 22   Ht 5\' 7"  (1.702 m)   Wt 83.5 kg (184 lb)   SpO2 95%   BMI 28.82 kg/m   Physical Exam  Constitutional: He is oriented to  person, place, and time. He appears well-developed and well-nourished. No distress.  Sitting at bedside in no apparent distress.  HENT:  Head: Normocephalic and atraumatic.  Mouth/Throat: Oropharynx is clear and moist. No oropharyngeal exudate.  Eyes: Pupils are equal, round, and reactive to light. Conjunctivae are normal. Right eye exhibits no discharge. Left eye exhibits no discharge.  Neck: Normal range of motion. Neck supple. No JVD present. No tracheal deviation present.  Cardiovascular: Normal rate, regular rhythm and intact distal pulses. Exam reveals no friction rub.  No murmur heard. Pulmonary/Chest: Effort normal and breath sounds normal. No stridor. No respiratory distress. He has no wheezes. He has no rales.  Abdominal: Soft. Bowel sounds are normal. There is no tenderness.  Musculoskeletal:  No leg swelling or calf tenderness.  Neurological: He is alert and oriented to person, place, and time. Coordination normal.  Skin: Skin is warm and dry. Capillary refill takes less than 2 seconds. He is not diaphoretic.  Psychiatric: He has a normal mood and affect. His behavior is normal.  Nursing note and vitals reviewed.    ED Treatments / Results  Labs (all labs ordered are listed, but only abnormal results are displayed) Labs Reviewed  BASIC METABOLIC PANEL - Abnormal; Notable for the following components:      Result Value   Glucose, Bld 134 (*)    BUN 21 (*)    All other components within normal limits  CBC - Abnormal; Notable for the following components:   Platelets 83 (*)    All other components within normal limits  I-STAT TROPONIN, ED    EKG EKG Interpretation  Date/Time:  Friday December 05 2017 06:14:56 EDT Ventricular Rate:  61 PR Interval:    QRS Duration: 97 QT Interval:  416 QTC Calculation: 419 R Axis:   142 Text Interpretation:  Right and left arm electrode reversal, interpretation assumes no reversal Sinus rhythm Right axis deviation Nonspecific T  abnormalities, lateral leads No significant change since last tracing Confirmed by Orpah Greek [phone]) on 12/05/2017 6:24:14 AM   Radiology [name] 2 View  Result Date: 12/05/2017 CLINICAL DATA:  Chest pain and shortness of breath. EXAM: CHEST - 2 VIEW COMPARISON:  05/30/2017 FINDINGS: Chronic elevation of the right hemidiaphragm with chronic volume loss at the right lung base. Heart size within normal limits. Chronic aortic atherosclerosis. No sign of active infiltrate, mass, effusion or collapse. No pulmonary edema. Ordinary mild degenerative changes affect the spine. IMPRESSION: No acute disease. Chronic elevation of the right hemidiaphragm with chronic volume loss at the right base. Electronically Signed   By: Nelson Chimes M.D.   On: 12/05/2017 07:15    Procedures Procedures (including critical care time)  Medications Ordered in ED Medications - No data to display   Initial Impression / Assessment and Plan / ED Course  I have reviewed the triage vital signs and the nursing notes.  Pertinent labs & imaging results that were available during my care of the patient were reviewed by me and considered in my medical decision making (see chart for details).     Patient with a history of known coronary artery disease presents to the emergency department for evaluation of acute onset left-sided chest pain which began this morning.  He reports associated shortness of breath and diaphoresis.  Chest pain lasted for about two hours and has now resolved.  He denies any symptoms at this time.  Initial troponin negative.  EKG nonischemic.  Chest x-ray without acute abnormality.  CBC reveals low platelet count of 83, per chart review he has a history of ITP and this is chronic for him, seen by oncology.  BMP without any major electrode abnormalities.  Plan to admit patient to medicine service for chest pain ACS rule out, he has a HEART score of 5.  Do not suspect PE given no tachycardia,  tachypnea, history of DVT/PE, leg swelling or calf tenderness, prolonged immobilization or recent surgery.  His vital signs are stable and he is in no acute distress, therefore doubt esophageal perforation.  Do not suspect aortic dissection given no radiation to the back, pulse deficits, new murmur or neurologic symptoms.  Discussed this patient with Hospitalist Dr. Jamse Arn who will admit patient and is asking  for Cardiology consult.  This was a shared visit with Dr. Rogene Houston who also saw the patient and agrees with plan to admit.  Patient informed and agrees with admission.  Cardiologist Dr. Peter Martinique saw the patient in the ED and feels that patient's pain is atypical for ACS. He states if patient has a negative delta troponin and negative Ddimer he can go home. Delta troponin negative and Ddimer negative. Patient will be discharged home from the ED and has been counseled to follow up with his Cardiologist regarding today's ER visit. Counseled him on return precautions and he agrees and voices understanding to above plan.   Final Clinical Impressions(s) / ED Diagnoses   Final diagnoses:  Chest pain, unspecified type    ED Discharge Orders    None       Glyn Ade, PA-C 12/05/17 1718    Fredia Sorrow, MD 12/07/17 [phone]

## 2017-12-05 NOTE — ED Provider Notes (Signed)
Medical screening examination/treatment/procedure(s) were conducted as a shared visit with non-physician practitioner(s) and myself.  I personally evaluated the patient during the encounter.  EKG Interpretation  Date/Time:  Friday December 05 2017 06:14:56 EDT Ventricular Rate:  61 PR Interval:    QRS Duration: 97 QT Interval:  416 QTC Calculation: 419 R Axis:   142 Text Interpretation:  Right and left arm electrode reversal, interpretation assumes no reversal Sinus rhythm Right axis deviation Nonspecific T abnormalities, lateral leads No significant change since last tracing Confirmed by Orpah Greek [phone]) on 12/05/2017 6:24:14 AM Also confirmed by Orpah Greek [phone]), editor Shon Hale [phone])  on 12/05/2017 7:29:40 AM  Patient with acute onset of chest pain at around 5 in the morning lasting for more than 30 minutes went away after taking several baby aspirin did not have nitroglycerin at home.  Patient had a stent placed many years ago has not had any chest pain since then.  Patient stated this chest pain reminded him of his previous MI.  Patient is now chest pain-free. Patient with significant coronary artery disease risk factors does require admission.  Initial troponin negative basic labs without significant abnormalities.  No acute findings on chest x-ray.  Patient can be admitted by the hospitalist is followed by cardiology locally as well.  Reason for admission is chest pain rule out.     Fredia Sorrow, MD 12/05/17 [phone]

## 2017-12-05 NOTE — ED Notes (Signed)
PAGED ADMITTING PER RN  

## 2017-12-05 NOTE — Consult Note (Signed)
Cardiology Admission History and Physical:   Patient ID: [name]; MRN: [id number]; DOB: [birth year]   Admission date: 12/05/2017  Primary Care Provider: [name], MD Primary Cardiologist: [name], MD   Chief Complaint:  Chest pain  Patient Profile:   [name] is a 77 y.o. male with a history of CAD, HTN, HLD, arthritis, ankylosing spondylitis and kidney stone.  History of Present Illness:   [name] has a history of anterior MI complicated by out of hospital ventricular fibrillation arrest in 2008. He was treated with BMS of the LAD at that time. LV function has been normal on all of his follow up evaluations. Cath in 2014 showed patent stent. He had and abnormal stress test in evaluation for progressive exertional dyspnea in 12/2016. He underwent a cardiac catheterization in 01/14/2017 that demonstrated patency of his LAD, circumflex and RCA were patent without significant obstruction. He has been managed medically.   He was seen for recurrent chest pain in 05/2017. Stat troponin T was negative.  Chest x-ray was stable with elevation of right hemidiaphragm no active disease.  Blood pressure was elevated so amlodipine was increased to 2.5 mg twice daily.  He was encouraged to use his CPAP.  He was last seen in our office By Dr. Burt Knack on 10/02/2017 at which time he was doing well. He has been compliant with his medical therapy.   Upon my assessment [name] tells me that he had been feeling well at bedtime last night. He was awakened at about 5 am with sharp, intense left chest pain that did not radiate. He points to a specific location, not tender now upon palpation.  He found himself breathing shallowly as he felt like deep breathing worsened the pain. He also felt a little clammy and at one point was chilled. He had no palpitations, dizziness, nausea. His wife called 54 and he was instructed to chew 4 aspirin which he did. Upon arrival of EMS his EKG not  abnormal. His pain had remained constant and then suddenly resolved after about an hour, just prior to transport to the ED. He was not given NTG. He's not sure if the aspirin relieved his pain. He has had no further CP.  This discomfort is different from his prior MI. With his MI his chest pain was severe pressure across his chest. With this episode his pain was not as severe, sharp and localized.   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 He has had no exertional chest discomfort. He does have mild DOE that signals him to slow down which has been present and pretty constant since his MI in 2008 with no recent worsening. He's had no orthopnea, PND or edema.  He checks his BP at home which runs in the 034'V-425'Z systolic. He sees Dr. [name] for his low platelets and reports that this has been stable with no intervention.    Past Medical History:  Diagnosis Date  . Abnormal chest x-ray   . Allergic rhinitis   . Ankylosing spondylitis ([place])   . Anxiety   . Arthritis   . Colonic polyp   . Coronary atherosclerosis of native coronary artery   . Cough   . Dizziness 10/21/2016  . Ganglion cyst of wrist    left  . Heart attack ([place]) 2008  . Hemorrhoids   . History of malaria   .  Hyperlipidemia    with low HDL  . Hypertension   . Kidney stone   . Other disorder of muscle, ligament, and fascia   . Rotator cuff tear   . Swelling of lower extremity 10/16/2015    Past Surgical History:  Procedure Laterality Date  . HEMORRHOID SURGERY  10/10   in [place]  . LEFT HEART CATH AND CORONARY ANGIOGRAPHY N/A 01/14/2017   Procedure: Left Heart Cath and Coronary Angiography;  Surgeon: [name], MD;  Location: [place] [facility];  Service: Cardiovascular;  Laterality: N/A;  . LEFT HEART CATHETERIZATION WITH CORONARY ANGIOGRAM N/A 09/03/2012   Procedure: LEFT HEART CATHETERIZATION WITH CORONARY ANGIOGRAM;   Surgeon: [name], MD;  Location: [facility] CATH LAB;  Service: Cardiovascular;  Laterality: N/A;  . ROTATOR CUFF REPAIR     bilat by Dr. French Ana     Medications Prior to Admission: Prior to Admission medications   Medication Sig Start Date End Date Taking? Authorizing Provider  amLODipine (NORVASC) 5 MG tablet Take 5 mg by mouth daily.   Yes [provider]  Apoaequorin (PREVAGEN PO) Take 1 tablet by mouth daily.    Yes [provider]  aspirin 81 MG EC tablet Take 81 mg by mouth daily.    Yes [provider]  chlorpheniramine (CHLOR-TRIMETON) 4 MG tablet Take 8 mg by mouth at bedtime.    Yes [provider]  famotidine (PEPCID) 20 MG tablet Take 20 mg by mouth at bedtime.   Yes [provider]  montelukast (SINGULAIR) 10 MG tablet Take 1 tablet (10 mg total) by mouth at bedtime. 11/17/17  Yes [name], MD  pantoprazole (PROTONIX) 40 MG tablet TAKE 1 TABLET BY MOUTH ONCE DAILY ,  30  TO  60  MINUTES  BEFORE  FIRST  MEAL  OF  THE  DAY 11/10/17  Yes [name], MD  simvastatin (ZOCOR) 20 MG tablet TAKE 1 TABLET EVERY DAY WITH BREAKFAST (NEED TO SCHEDULE YEARLY APPT W/DR COOPER FOR MAY BEFORE ANY REFILLS [phone]) 01/15/17  Yes [name], MD  Respiratory Therapy Supplies (FLUTTER) DEVI Use as directed 12/12/16   [name], MD     Allergies:    Allergies  Allergen Reactions  . Tramadol Other (See Comments)    Makes patient jumpy    Social History:   Social History   Socioeconomic History  . Marital status: Married    Spouse name: [name]  . Number of children: 2  . Years of education: Not on file  . Highest education level: Not on file  Occupational History  . Not on file  Social Needs  . Financial resource strain: Not on file  . Food insecurity:    Worry: Not on file    Inability: Not on file  . Transportation needs:    Medical: Not on file    Non-medical: Not on file  Tobacco Use  . Smoking status: Never  Smoker  . Smokeless tobacco: Never Used  Substance and Sexual Activity  . Alcohol use: No    Comment: occ 1 beer, wine occ  . Drug use: No  . Sexual activity: Yes  Lifestyle  . Physical activity:    Days per week: Not on file    Minutes per session: Not on file  . Stress: Not on file  Relationships  . Social connections:    Talks on phone: Not on file    Gets together: Not on file  Attends religious service: Not on file    Active member of club or organization: Not on file    Attends meetings of clubs or organizations: Not on file    Relationship status: Not on file  . Intimate partner violence:    Fear of current or ex partner: Not on file    Emotionally abused: Not on file    Physically abused: Not on file    Forced sexual activity: Not on file  Other Topics Concern  . Not on file  Social History Narrative  . Not on file    Family History:   The patient's family history includes Cancer in his mother; Diabetes in his mother; Emphysema in his mother; Rheum arthritis in his mother.    ROS:  Please see the history of present illness.  All other ROS reviewed and negative.     Physical Exam/Data:   Vitals:   12/05/17 0730 12/05/17 0800 12/05/17 0830 12/05/17 0900  BP: (!) 157/67 (!) 155/61 (!) 148/68 (!) 143/81  Pulse: 65 (!) 56 (!) 54 (!) 53  Resp: 17 16 11 15   Temp:      TempSrc:      SpO2: 95% 96% 95% 96%  Weight:      Height:       No intake or output data in the 24 hours ending 12/05/17 1006 Filed Weights   12/05/17 0611  Weight: 184 lb (83.5 kg)   Body mass index is 28.82 kg/m.  General:  Well nourished, well developed, in no acute distress HEENT: normal Lymph: no adenopathy Neck: no JVD Endocrine:  No thryomegaly Vascular: No carotid bruits; FA pulses 2+ bilaterally without bruits  Cardiac:  normal S1, S2; RRR; no murmur  Lungs:  clear to auscultation bilaterally, no wheezing, rhonchi or rales  Abd: soft, nontender, no hepatomegaly  Ext: no  edema Musculoskeletal:  No deformities, BUE and BLE strength normal and equal Skin: warm and dry, faint fine petechial rash on lower legs Neuro:  CNs 2-12 intact, no focal abnormalities noted Psych:  Normal affect    EKG:  The ECG that was done was personally reviewed and demonstrates NSR, 61 bpm, non-specific ST changes, no change from previous, no acute ischemic changes  Relevant CV Studies:  Left Heart Cath and Coronary Angiography  01/14/2017  Conclusion  1. Continued patency of the LAD stented segment with no significant restenosis 2. Severe stenosis of a small diagonal branch unchanged from the previous study in 2014 3. Mild nonobstructive disease of the right coronary artery and left circumflex 4. Normal LVEDP with known normal LV systolic function by noninvasive assessment  Recommendations: Continued medical therapy. I don't think the patient's diagonal stenosis is causing any symptoms and clearly wouldn't be related to his exertional dyspnea. It may however be the reason he has an abnormal stress ECG. Recommend ongoing medical therapy with initiation of an exercise program. The diagonal is small and not well suited for PCI.   ------------------------------------------------------------------- Echocardiogram 11/30/15 Study Conclusions - Left ventricle: The cavity size was normal. There was mild   concentric hypertrophy. Systolic function was vigorous. The   estimated ejection fraction was in the range of 65% to 70%. Wall   motion was normal; there were no regional wall motion   abnormalities. Doppler parameters are consistent with abnormal   left ventricular relaxation (grade 1 diastolic dysfunction).   Doppler parameters are consistent with elevated ventricular   end-diastolic filling pressure. - Aortic valve: Trileaflet; normal thickness leaflets. There was  no   regurgitation. - Aortic root: The aortic root was normal in size. - Mitral valve: Structurally normal valve. -  Left atrium: The atrium was mildly dilated. - Right ventricle: The cavity size was normal. Wall thickness was   normal. Systolic function was normal. - Right atrium: The atrium was normal in size. - Tricuspid valve: There was no regurgitation. - Pulmonic valve: There was no regurgitation. - Pulmonary arteries: Systolic pressure was within the normal   range. - Inferior vena cava: The vessel was normal in size. - Pericardium, extracardiac: There was no pericardial effusion.   Laboratory Data:  Chemistry Recent Labs  Lab 12/05/17 0617  NA 140  K 3.6  CL 107  CO2 23  GLUCOSE 134*  BUN 21*  CREATININE 0.95  CALCIUM 8.9  GFRNONAA >60  GFRAA >60  ANIONGAP 10    No results for input(s): PROT, ALBUMIN, AST, ALT, ALKPHOS, BILITOT in the last 168 hours. Hematology Recent Labs  Lab 12/05/17 0617  WBC 5.2  RBC 4.85  HGB 14.6  HCT 44.1  MCV 90.9  MCH 30.1  MCHC 33.1  RDW 14.5  PLT 83*   Cardiac EnzymesNo results for input(s): TROPONINI in the last 168 hours.  Recent Labs  Lab 12/05/17 0622  TROPIPOC 0.00    BNPNo results for input(s): BNP, PROBNP in the last 168 hours.  DDimer No results for input(s): DDIMER in the last 168 hours.  Radiology/Studies:  [name] 2 View  Result Date: 12/05/2017 CLINICAL DATA:  Chest pain and shortness of breath. EXAM: CHEST - 2 VIEW COMPARISON:  05/30/2017 FINDINGS: Chronic elevation of the right hemidiaphragm with chronic volume loss at the right lung base. Heart size within normal limits. Chronic aortic atherosclerosis. No sign of active infiltrate, mass, effusion or collapse. No pulmonary edema. Ordinary mild degenerative changes affect the spine. IMPRESSION: No acute disease. Chronic elevation of the right hemidiaphragm with chronic volume loss at the right base. Electronically Signed   By: Nelson Chimes M.D.   On: 12/05/2017 07:15    Assessment and Plan:   Chest pain -Pt with sudden onset sharp left localized chest pain, lasting  about an hour and resolving spontaneously. No associated symptoms. No recent exertional symptoms. Not consistent with myocardial ischemia, although he has a significant hx of CAD.  -first troponin 0.00 -EKG without ischemic changes  -BMet and CBC without significant abnormality -This does not look like ACS. Would not cath at this time. Stress test would likely not be reliable.  -Would check D-dimer and one more troponin. If negative can discharge with follow up in our office. (I have made an appointment)  CAD -Known hx of CAD s/p anterior MI with Vfib arrest, BMS 2008. Cath in 01/2017 showed stenosis of a small diagonal not amenable to stenting and nor felt to be contributing to his shortness of breath, otherwise no obstructive disease. Normal LV function.  -Treated with aspirin, statin. No BB due to bradycardia  Hypertension -On amlodipine 5 mg, well controlled.   Hyperlipidemia -On simvastatin 20 mg.  LDL 55 in 12/2016, at goal of <70  Thrombocytopenia -Followed by Dr. [name]. Platelets have remained >50k. No interventions. -Platelets 83k today. Pt has fine petechial rash on lower legs, nowhere else. No unusual bruising.    For questions or updates, please contact [place] Please consult [email or web address] for contact info under Cardiology/STEMI.    Signed, Daune Perch, NP  12/05/2017 10:06 AM

## 2017-12-05 NOTE — ED Notes (Signed)
Lunch tray ordered 

## 2017-12-15 DIAGNOSIS — K219 Gastro-esophageal reflux disease without esophagitis: Secondary | ICD-10-CM | POA: Insufficient documentation

## 2017-12-15 NOTE — Progress Notes (Signed)
Cardiology Office Note:    Date:  12/17/2017   ID:  [name], DOB [birth year], MRN [id number]  PCP:  [name], MD  Cardiologist:  [name], MD  Referring MD: [name], MD   Chief Complaint  Patient presents with  . Follow-up    had chest pains x2 weeks ago, no current chest pains, SOB, or swelling in hands/feet, pt does complain of fatigue and cramps in legs   History of Present Illness:    KEAN GAUTREAU is a 77 y.o. male with a past medical history significant for hypertension, hyperlipidemia, arthritis, ankylosing spondylitis, kidney stones, OSA and CAD status post anterior STEMI with V. fib arrest in 2008 with stenting of the LAD and multiple caths since then including June 2018 with no progression of disease and patent LAD stent.  Note: Prior stress tests have been abnormal.    He was seen in the emergency department on 12/05/2017 for evaluation of acute sharp localized pain in the left precordium that lasted for about an hour and resolved.  His pain was worse with deep breathing.  Troponin was negative X2 and chest x-ray showed no acute disease.  His chest pain was atypical for ACS, pleuritic or musculoskeletal.  D-dimer was not elevated.  He was discharged from the emergency department with follow-up in the office.  Today he is here for follow up of his chest pain. He has had no recurrence. 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 He does feel that he is slowed down by leg pain and fatigue but no exertional chest pain or dyspnea. He has no orthopnea, PND, edema, palpitations or lightheadedness.   Past Medical History:  Diagnosis Date  . Abnormal chest x-ray   . Allergic rhinitis   . Ankylosing spondylitis ([place])   . Anxiety   . Arthritis   . Colonic polyp   . Coronary atherosclerosis of native coronary artery   . Cough   . Dizziness 10/21/2016  . Ganglion cyst of wrist    left  . Heart attack ([place]) 2008  .  Hemorrhoids   . History of malaria   . Hyperlipidemia    with low HDL  . Hypertension   . Kidney stone   . Other disorder of muscle, ligament, and fascia   . Rotator cuff tear   . Swelling of lower extremity 10/16/2015    Past Surgical History:  Procedure Laterality Date  . HEMORRHOID SURGERY  10/10   in [place]  . LEFT HEART CATH AND CORONARY ANGIOGRAPHY N/A 01/14/2017   Procedure: Left Heart Cath and Coronary Angiography;  Surgeon: [name], MD;  Location: [facility];  Service: Cardiovascular;  Laterality: N/A;  . LEFT HEART CATHETERIZATION WITH CORONARY ANGIOGRAM N/A 09/03/2012   Procedure: LEFT HEART CATHETERIZATION WITH CORONARY ANGIOGRAM;  Surgeon: [name], MD;  Location: [facility] C[facility] LAB;  Service: Cardiovascular;  Laterality: N/A;  . ROTATOR CUFF REPAIR     bilat by Dr. French Ana    Current Medications: Current Meds  Medication Sig  . amLODipine (NORVASC) 5 MG tablet Take 5 mg by mouth daily.  [name] Apoaequorin (PREVAGEN PO) Take 1 tablet by mouth daily.   [name] aspirin 81 MG EC tablet Take 81 mg by mouth daily.   . chlorpheniramine (CHLOR-TRIMETON) 4 MG tablet Take 8 mg by mouth at bedtime.   . famotidine (PEPCID) 20 MG tablet Take 20 mg by mouth at bedtime.  [name]  montelukast (SINGULAIR) 10 MG tablet Take 1 tablet (10 mg total) by mouth at bedtime.  . simvastatin (ZOCOR) 20 MG tablet TAKE 1 TABLET EVERY DAY WITH BREAKFAST (NEED TO SCHEDULE YEARLY APPT W/DR COOPER FOR MAY BEFORE ANY REFILLS [id number])     Allergies:   Tramadol   Social History   Socioeconomic History  . Marital status: Married    Spouse name: [name]  . Number of children: 2  . Years of education: Not on file  . Highest education level: Not on file  Occupational History  . Not on file  Social Needs  . Financial resource strain: Not on file  . Food insecurity:    Worry: Not on file    Inability: Not on file  . Transportation needs:    Medical: Not on file    Non-medical: Not on file  Tobacco  Use  . Smoking status: Never Smoker  . Smokeless tobacco: Never Used  Substance and Sexual Activity  . Alcohol use: No    Comment: occ 1 beer, wine occ  . Drug use: No  . Sexual activity: Yes  Lifestyle  . Physical activity:    Days per week: Not on file    Minutes per session: Not on file  . Stress: Not on file  Relationships  . Social connections:    Talks on phone: Not on file    Gets together: Not on file    Attends religious service: Not on file    Active member of club or organization: Not on file    Attends meetings of clubs or organizations: Not on file    Relationship status: Not on file  Other Topics Concern  . Not on file  Social History Narrative  . Not on file     Family History: The patient's family history includes Cancer in his mother; Diabetes in his mother; Emphysema in his mother; Rheum arthritis in his mother. ROS:   Please see the history of present illness.     All other systems reviewed and are negative.  EKGs/Labs/Other Studies Reviewed:    The following studies were reviewed today:   Left Heart Cath and Coronary Angiography  01/14/2017  Conclusion  1. Continued patency of the LAD stented segment with no significant restenosis 2. Severe stenosis of a small diagonal branch unchanged from the previous study in 2014 3. Mild nonobstructive disease of the right coronary artery and left circumflex 4. Normal LVEDP with known normal LV systolic function by noninvasive assessment  Recommendations: Continued medical therapy. I don't think the patient's diagonal stenosis is causing any symptoms and clearly wouldn't be related to his exertional dyspnea. It may however be the reason he has an abnormal stress ECG. Recommend ongoing medical therapy with initiation of an exercise program. The diagonal is small and not well suited for PCI.   ------------------------------------------------------------------- Echocardiogram 11/30/15 Study Conclusions - Left  ventricle: The cavity size was normal. There was mild concentric hypertrophy. Systolic function was vigorous. The estimated ejection fraction was in the range of 65% to 70%. Wall motion was normal; there were no regional wall motion abnormalities. Doppler parameters are consistent with abnormal left ventricular relaxation (grade 1 diastolic dysfunction). Doppler parameters are consistent with elevated ventricular end-diastolic filling pressure. - Aortic valve: Trileaflet; normal thickness leaflets. There was no regurgitation. - Aortic root: The aortic root was normal in size. - Mitral valve: Structurally normal valve. - Left atrium: The atrium was mildly dilated. - Right ventricle: The cavity size  was normal. Wall thickness was normal. Systolic function was normal. - Right atrium: The atrium was normal in size. - Tricuspid valve: There was no regurgitation. - Pulmonic valve: There was no regurgitation. - Pulmonary arteries: Systolic pressure was within the normal range. - Inferior vena cava: The vessel was normal in size. - Pericardium, extracardiac: There was no pericardial effusion.   EKG:  EKG is not ordered today.    Recent Labs: 10/20/2017: ALT 33 12/05/2017: BUN 21; Creatinine, Ser 0.95; Hemoglobin 14.6; Platelets 83; Potassium 3.6; Sodium 140   Recent Lipid Panel    Component Value Date/Time   CHOL 105 01/08/2017 0754   TRIG 57 01/08/2017 0754   HDL 39 (L) 01/08/2017 0754   CHOLHDL 2.7 01/08/2017 0754   CHOLHDL 2.8 12/18/2015 0831   VLDL 14 12/18/2015 0831   LDLCALC 55 01/08/2017 0754    Physical Exam:    VS:  BP 138/78 (BP Location: Right Arm)   Pulse 66   Ht 5\' 7"  (1.702 m)   Wt 186 lb 8 oz (84.6 kg)   BMI 29.21 kg/m     Wt Readings from Last 3 Encounters:  12/17/17 186 lb 8 oz (84.6 kg)  12/05/17 184 lb (83.5 kg)  11/17/17 186 lb 12.8 oz (84.7 kg)     Physical Exam  Constitutional: He is oriented to person, place, and time. He  appears well-developed and well-nourished. No distress.  HENT:  Head: Normocephalic and atraumatic.  Neck: Normal range of motion. Neck supple. No JVD present.  Cardiovascular: Normal rate, regular rhythm, normal heart sounds and intact distal pulses. Exam reveals no gallop and no friction rub.  No murmur heard. Pulmonary/Chest: Effort normal and breath sounds normal. No respiratory distress. He has no wheezes. He has no rales. He exhibits no tenderness.  Abdominal: Soft. Bowel sounds are normal. There is no tenderness.  Musculoskeletal: Normal range of motion. He exhibits no edema or deformity.  Neurological: He is alert and oriented to person, place, and time.  Skin: Skin is warm and dry.  Psychiatric: He has a normal mood and affect. His behavior is normal. Thought content normal.  Vitals reviewed.    ASSESSMENT:    1. Coronary artery disease involving native coronary artery of native heart with angina pectoris ([place])   2. Essential hypertension   3. HYPERCHOLESTEROLEMIA   4. Thrombocytopenia ([place])   5. Gastroesophageal reflux disease without esophagitis    PLAN:    In order of problems listed above:  CAD -Known history of CAD status post anterior MI with V. fib arrest, BMS 2008.  Most recent cath in 01/2017 showed stenosis of a small diagonal not amenable to stenting and not felt to be contributing to his shortness of breath, otherwise no obstructive disease.  Normal LV function. -Treated with aspirin, statin.  No beta-blocker due to bradycardia -Recent chest pain very atypical for cardiac source, localized, reproducible and worse with deep breathing. More consistent with musculoskeletal pain. No recurrences. No EKG changes or troponin elevation. No recent exertional symptoms. No indication for further cardiac evaluation at this time.   Hypertension -On amlodipine 5 mg daily, well-controlled. Home SBP in the 120's.   Hyperlipidemia -On simvastatin 20 mg daily.  LDL 55 in  12/2016, at goal <70  Thrombocytopenia -Followed by Dr. Earlie Server.  Platelets have remained> 50,000 with no interventions.  GERD -Treated with famotidine at bedtime  OSA, severe by home sleep test in 12/2015 -He has been having trouble sleeping with the CPAP and has  not been using. He does note more daytime sleepiness and sleeping later. After discussion he agrees to try tolerating the CPAP again.     Medication Adjustments/Labs and Tests Ordered: Current medicines are reviewed at length with the patient today.  Concerns regarding medicines are outlined above. Labs and tests ordered and medication changes are outlined in the patient instructions below:  Patient Instructions  Medication Instructions:  Your physician recommends that you continue on your current medications as directed. Please refer to the Current Medication list given to you today.   Labwork: None ordered  Testing/Procedures: None ordered  Follow-Up: Your physician wants you to follow-up in: February with Dr. Burt Knack. You will receive a reminder letter in the mail two months in advance. If you don't receive a letter, please call our office to schedule the follow-up appointment.   Any Other Special Instructions Will Be Listed Below (If Applicable).     If you need a refill on your cardiac medications before your next appointment, please call your pharmacy.      Signed, Daune Perch, NP  12/17/2017 9:16 AM    [place]

## 2017-12-17 ENCOUNTER — Encounter: Payer: Self-pay | Admitting: Cardiology

## 2017-12-17 ENCOUNTER — Ambulatory Visit (INDEPENDENT_AMBULATORY_CARE_PROVIDER_SITE_OTHER): Payer: Medicare Other | Admitting: Cardiology

## 2017-12-17 VITALS — BP 138/78 | HR 66 | Ht 67.0 in | Wt 186.5 lb

## 2017-12-17 DIAGNOSIS — G4733 Obstructive sleep apnea (adult) (pediatric): Secondary | ICD-10-CM | POA: Diagnosis not present

## 2017-12-17 DIAGNOSIS — K219 Gastro-esophageal reflux disease without esophagitis: Secondary | ICD-10-CM | POA: Diagnosis not present

## 2017-12-17 DIAGNOSIS — D696 Thrombocytopenia, unspecified: Secondary | ICD-10-CM

## 2017-12-17 DIAGNOSIS — I25119 Atherosclerotic heart disease of native coronary artery with unspecified angina pectoris: Secondary | ICD-10-CM

## 2017-12-17 DIAGNOSIS — Z9989 Dependence on other enabling machines and devices: Secondary | ICD-10-CM | POA: Diagnosis not present

## 2017-12-17 DIAGNOSIS — I10 Essential (primary) hypertension: Secondary | ICD-10-CM | POA: Diagnosis not present

## 2017-12-17 DIAGNOSIS — E7800 Pure hypercholesterolemia, unspecified: Secondary | ICD-10-CM

## 2017-12-17 NOTE — Patient Instructions (Signed)
Medication Instructions:  Your physician recommends that you continue on your current medications as directed. Please refer to the Current Medication list given to you today.   Labwork: None ordered  Testing/Procedures: None ordered  Follow-Up: Your physician wants you to follow-up in: February with Dr. Burt Knack. You will receive a reminder letter in the mail two months in advance. If you don't receive a letter, please call our office to schedule the follow-up appointment.   Any Other Special Instructions Will Be Listed Below (If Applicable).     If you need a refill on your cardiac medications before your next appointment, please call your pharmacy.

## 2018-01-14 DIAGNOSIS — L820 Inflamed seborrheic keratosis: Secondary | ICD-10-CM | POA: Diagnosis not present

## 2018-01-14 DIAGNOSIS — D485 Neoplasm of uncertain behavior of skin: Secondary | ICD-10-CM | POA: Diagnosis not present

## 2018-01-14 DIAGNOSIS — R202 Paresthesia of skin: Secondary | ICD-10-CM | POA: Diagnosis not present

## 2018-01-14 DIAGNOSIS — L570 Actinic keratosis: Secondary | ICD-10-CM | POA: Diagnosis not present

## 2018-01-14 DIAGNOSIS — Z85828 Personal history of other malignant neoplasm of skin: Secondary | ICD-10-CM | POA: Diagnosis not present

## 2018-01-22 DIAGNOSIS — M722 Plantar fascial fibromatosis: Secondary | ICD-10-CM | POA: Diagnosis not present

## 2018-01-22 DIAGNOSIS — M7731 Calcaneal spur, right foot: Secondary | ICD-10-CM | POA: Diagnosis not present

## 2018-01-29 DIAGNOSIS — M722 Plantar fascial fibromatosis: Secondary | ICD-10-CM | POA: Diagnosis not present

## 2018-02-13 ENCOUNTER — Ambulatory Visit (INDEPENDENT_AMBULATORY_CARE_PROVIDER_SITE_OTHER): Payer: Medicare Other | Admitting: Internal Medicine

## 2018-02-13 ENCOUNTER — Other Ambulatory Visit: Payer: Self-pay | Admitting: *Deleted

## 2018-02-13 ENCOUNTER — Encounter: Payer: Self-pay | Admitting: Internal Medicine

## 2018-02-13 ENCOUNTER — Telehealth: Payer: Self-pay | Admitting: Cardiovascular Disease

## 2018-02-13 ENCOUNTER — Other Ambulatory Visit: Payer: Self-pay

## 2018-02-13 VITALS — BP 128/68 | HR 80 | Temp 99.3°F | Ht 67.0 in | Wt 190.8 lb

## 2018-02-13 DIAGNOSIS — J45991 Cough variant asthma: Secondary | ICD-10-CM

## 2018-02-13 DIAGNOSIS — I25119 Atherosclerotic heart disease of native coronary artery with unspecified angina pectoris: Secondary | ICD-10-CM

## 2018-02-13 MED ORDER — SIMVASTATIN 20 MG PO TABS: ORAL_TABLET | ORAL | 0 refills | Status: DC

## 2018-02-13 MED ORDER — AZITHROMYCIN 250 MG PO TABS: ORAL_TABLET | ORAL | 0 refills | Status: DC

## 2018-02-13 MED ORDER — SIMVASTATIN 20 MG PO TABS: ORAL_TABLET | ORAL | 2 refills | Status: DC

## 2018-02-13 MED ORDER — PREDNISONE 10 MG PO TABS: ORAL_TABLET | ORAL | 0 refills | Status: DC

## 2018-02-13 NOTE — Patient Instructions (Addendum)
zpak Prednisone 10 mg take  4 each am x 2 days,   2 each am x 2 days,  1 each am x 2 days and stop    See calendar for specific medication instructions and bring it back for each and every office visit for every healthcare provider you see.  Without it,  you may not receive the best quality medical care that we feel you deserve.  You will note that the calendar groups together  your maintenance  medications that are timed at particular times of the day.  Think of this as your checklist for what your doctor has instructed you to do until your next evaluation to see what benefit  there is  to staying on a consistent group of medications intended to keep you well.  The other group at the bottom is entirely up to you to use as you see fit  for specific symptoms that may arise between visits that require you to treat them on an as needed basis.  Think of this as your action plan or "what if" list.   Separating the top medications from the bottom group is fundamental to providing you adequate care going forward.    Call first of next week if not improving

## 2018-02-13 NOTE — Telephone Encounter (Signed)
New message     *STAT* If patient is at the pharmacy, call can be transferred to refill team.   1. Which medications need to be refilled? (please list name of each medication and dose if known) simvastatin (ZOCOR) 20 MG tablet 2. Which pharmacy/location (including street and city if local pharmacy) is medication to be sent to [facility] ([facility] [place] - [address] 3. Do they need a 30 day or 90 day supply? 30      1. Which medications need to be refilled? (please list name of each medication and dose if known) simvastatin (ZOCOR) 20 MG tablet  2. Which pharmacy/location (including street and city if local pharmacy) is medication to be sent to [facility] mail order  3. Do they need a 30 day or 90 day supply? [place]

## 2018-02-13 NOTE — Progress Notes (Signed)
Brief patient profile:  49   yowm never smoker with ankylosing spondilitis  with new onset variable  cough since Oct 2016 comes and goes s background of asthma but with background of nasal allergies for which took shots and moved residence in 1980s  >  No further problems except nasal drip /sneezing [place] then fine when stopped going there.    History of Present Illness  Congestion in chest x Oct 2016 worse in evenings with mostly dry coughing and much worse x sev weeks > Sood eval on 07/15/16 cough for a week/ albterol and pred no better  rec Advair one puff twice per day until sample completed >> call if you feel you need a refill after this Zithromax 250 mg pill >> 2 pills on day 1, then 1 pill daily for next four days Albuterol 2 puffs every 4 to 6 hours as needed for cough, wheeze, or chest congestion Finish course of prednisone    07/22/2016 acute extended ov/Nyisha Clippard re:  Refractory cough > sob on advair and pred taper and getting worse Chief Complaint  Patient presents with  . Acute Visit    Increased SOB and cough are not improving since acute ov here 07/15/16. His cough has been occ prod with light colored sputum.  He states that his breathing does okay in the am's and then gets worse in the evenings.   on advair dpi/ benzoate and took pred 10 mg one tablet  This am and cough seems ok when he wakes up and progressively worse with Minimal actual sputum production as the day goes on and then does not really bother him while sleeping.. Shaking on details of care/ names of meds/ no better with saba  rec Take delsym two tsp every 12 hours and supplement if needed with  tramadol 50 mg up to 1 every 4 hours  .   Symbicort 80 Take 2 puffs first thing in am and then another 2 puffs about 12 hours later - if helping refill it Only use your albuterol (ventolin) inhaler  As long as coughing at all:  Try prilosec otc 20mg   Take 30-60 min before first meal of the day  and Pepcid ac (famotidine) 20 mg one @  bedtime until cough is completely gone for at least a week without the need for cough suppression GERD diet    07/30/2016 acute extended ov/Carston Riedl re: refractory cough since late Nov 2017  Chief Complaint  Patient presents with  . Acute Visit    Pt c/o increased cough and congestion for the past 2 wks. He states symptoms get worse in the evening. Cough is prod in the am's with thick, clear sputum.  He states he can not lie down flat due to cough and cough sometimes waked him up. He also c/o wheezing.   brought all his meds, not using symb 2bid per count, only missing 11 tramadol "it's for pain, right? "  Some better while on pred, worse since finished it/ only taking prilosec otc 20 mg qam / is taking pepcid 20 mg hs  Not using any saba "it's for emergencies, right?"  rec Prednisone 10 mg take  4 each am x 2 days,   2 each am x 2 days,  1 each am x 2 days and stop  Increase the prilosec ac to where you take x 2 30-60 mn before breakfast  And continue the pepcid ac 20mg  in evening  Continue symbicort 80 (  same as dulera 100 sample) Take 2 puffs first thing in am and then another 2 puffs about 12 hours later.  Only use your albuterol as a rescue medication  For cough use mucinex dm 1200 mg every 12 hours and supplement with the tramadol up to 2 every 4 hours until no cough x 3 days  Please see patient coordinator before you leave today  to schedule sinus CT    08/21/16  Restart Symbicort 2 puffs Twice daily  .  Restart Prilosec 20mg  daily before meal  Restart Pepcid 20mg  At bedtime   May use Zyrtec 10mg  At bedtime  As needed  Drainage  Zpack take as directed.  Mucinex DM Twice daily  As needed  Cough/congestion .     10/10/2016 acute extended ov/Konnar Ben re:  uacs vs asthma  maint on symb 80 2bid / gerd rx with otcs only  Chief Complaint  Patient presents with  . Acute Visit    Pt c/o cough and increased SOB x 1 wk, and progressively worse over the past  3-4 days. His cough is prod with thick, clear sputum. He states can not lie down flat without cough.   sensation of something stuck never gone away since = oct 2016 then acutely worse sob/ cough x 1 week prior to OV  And not better p saba Does not have mucinex dm / no zyrtec  rec First take delsym two tsp every 12 hours and supplement if needed with  tramadol 50 mg up to 2 every 4 hours to suppress the urge to cough at all or even clear your throat. Swallowing water or using ice chips/non mint and menthol containing candies (such as lifesavers or sugarless jolly ranchers) are also effective.  You should rest your voice and avoid activities that you know make you cough. Once you have eliminated the cough for 3 straight days try reducing the tramadol first,  then the delsym as tolerated.   Prednisone 10 mg take  4 each am x 2 days,   2 each am x 2 days,  1 each am x 2 days and stop (this is to eliminate allergies and inflammation from coughing) Protonix (pantoprazole) Take 30-60 min before first meal of the day and Pepcid 20 mg one bedtime plus chlorpheniramine 4 mg x 2 at bedtime (both available over the counter)  until cough is completely gone for at least a week without the need for cough suppression For drainage / throat tickle try take CHLORPHENIRAMINE  4 mg - take one every 4 hours as needed - available over the counter- may cause drowsiness so start with just a bedtime dose or two and see how you tolerate it before trying in daytime   GERD diet  Stop prilosec/ mucinex  Please remember to go to the lab  department downstairs in the basement  for your tests - we will call you with the results when they are available.  Please schedule a follow up office visit in 2 weeks, sooner if needed  with all medications /inhalers/ solutions in hand so we can verify exactly what you are taking. This includes all medications from all doctors and over the counters Add did not got to lab as rec    10/17/2016  f/u  ov/Lamar Naef re: intermittent cough x oct 2016 def gets better on prednisone / sense of chest congestion but no excess mucus Chief Complaint  Patient presents with  . Follow-up    PFT's done today.  symptoms of  daily cough  worse p supper Did not bring meds as requested / not clear he even read them when reading them back line by line  Not using any albuterol "doesn't help"  Not able to lie down due to cough x month x no better even while on prednisone though "80% better" on pred vs off it/ no longer using cpap rec Add singulair each evening 10 daily trial basis  Please remember to go to the lab  department downstairs in the basement  for your tests - we will call you with the results when they are available. See Tammy NP in 4  weeks with all your medications, same page with your medication use she will arrange follow up with me or with allergy depending on your tests  Add : consider adding flutter if doesn't have one and HRCT looking for bronchiectasis      11/15/16 NP eval May stop  Tramadol  Use Delsym 2 tsp every 12hr for coughing .  Use Mucinex Twice daily  For congestion and mucus.  Call back with formulary options for Symbicort.  Follow med calendar closely and bring to each visit     12/12/2016  f/u ov/Leianne Callins re:  Chronic cough/ bronchiectasis / not convinced symb 80 helping  Chief Complaint  Patient presents with  . Follow-up    Breathing is "fine". He still c/o chest congestion. He is not coughing much but when he does it is prod with cream colored sputum.    rec Stop mucinex / delsym and symbicort and just use ventolin as needed  Work on inhaler technique:  relax and gently blow all the way out then take a nice smooth deep breath back in, triggering the inhaler at same time you start breathing in.  Hold for up to 5 seconds if you can. Blow out thru nose. Rinse and gargle with water when done Add mucinex dm 1200 mg twice daily and flutter valve as much as you can  Flonase twice  daily each nostril See calendar for specific medication instruction     01/13/2017  f/u ov/Shaelee Forni re: chonic  cough since oct 2016 / documented bronchiectasis / not using med calendar  Chief Complaint  Patient presents with  . Follow-up    Still has some prod cough with clear sputum.   at hs takes 2 h1 and sleeps fine, no am flares but as soon as stirs experiences sense of pnds s  excess/ purulent sputum or mucus plugs  . No need for more saba off symbicort  rec Try dymista       11/17/2017  f/u ov/Saydi Kobel re:  Cough x 2016 better  And ? Whether to continue ppi/ singulair  Chief Complaint  Patient presents with  . Follow-up    no voiced concerns  Dyspnea:  Not limited by breathing from desired activities   Cough: none Sleep: ok SABA use: none rec In the event the cough returns on your present meds, resume Prilosec 20 mg Take 30-60 min before first meal of the day  If you are doing great into the summer months ok to try off the montelukast to see what difference if any this makes and if worse sinus symptoms or cough then resume daily  See your orthopedic doctor about your heal ? Developing plantar fasciitis? > better p steroid injection      02/13/2018  f/u ov/Ameris Akamine re:  Recurrent severe cough while on last day of cruise to [place] from [place] 6/23-30/19  Chief  Complaint  Patient presents with  . Acute Visit    Pt c/o cough with clear sputum, wheezing and increased SOB x 3 days. He states his chest hurts when he coughs.   onset cough/ wheeze abrupt on 02/07/18 > mucus is clear 24/7  / sob just with activity  Not using any ventolin yet nor did he activate any of the action plans on his medcalendar which he initially denied receiving but found on his iphone p searching. Sleeping in recliner  Assoc with gen chest tightness/ chest discomfort with cough and  overt HB on just pepcid 20 mg hs    No obvious day to day or daytime variability or assoc excess/ purulent sputum or mucus plugs or  hemoptysis or cp   or overt sinus   symptoms.    Also denies any obvious fluctuation of symptoms with weather or environmental changes or other aggravating or alleviating factors except as outlined above   No unusual exposure hx or h/o childhood pna/ asthma or knowledge of premature birth.  Current Allergies, Complete Past Medical History, Past Surgical History, Family History, and Social History were reviewed in [employer] record.  ROS  The following are not active complaints unless bolded Hoarseness, sore throat, dysphagia, dental problems, itching, sneezing,  nasal congestion or discharge of excess mucus or purulent secretions, ear ache,   fever, chills, sweats, unintended wt loss or wt gain, classically pleuritic or exertional cp,  orthopnea pnd or arm/hand swelling  or leg swelling, presyncope, palpitations, abdominal pain, anorexia, nausea, vomiting, diarrhea  or change in bowel habits or change in bladder habits, change in stools or change in urine, dysuria, hematuria,  rash, arthralgias, visual complaints, headache, numbness, weakness or ataxia or problems with walking or coordination,  change in mood or  memory.        Current Meds  Medication Sig  . amLODipine (NORVASC) 5 MG tablet Take 5 mg by mouth daily.  [name] Apoaequorin (PREVAGEN PO) Take 1 tablet by mouth daily.   [name] aspirin 81 MG EC tablet Take 81 mg by mouth daily.   . chlorpheniramine (CHLOR-TRIMETON) 4 MG tablet Take 8 mg by mouth at bedtime.   . famotidine (PEPCID) 20 MG tablet Take 20 mg by mouth at bedtime.  . montelukast (SINGULAIR) 10 MG tablet Take 1 tablet (10 mg total) by mouth at bedtime.  [name] Respiratory Therapy Supplies (FLUTTER) DEVI Use as directed  . simvastatin (ZOCOR) 20 MG tablet TAKE 1 TABLET EVERY DAY WITH BREAKFAST             Pulmonary tests CT angio chest 11/30/15 >> motion artifact, mosaic pattern, calcified granuloma SNIFF test 11/30/15 >> elevated Rt diaphragm, but has b/l  diaphragm movement  Cardiac tests 10/16/15 Doppler legs b/l >> no DVT 11/30/15 Echo >> EF 65 to 41%, grade 1 diastolic CHF  Sleep tests HST 12/21/15 >> AHI 44.5, SpO2 low 64% Auto CPAP 03/02/16 to 03/31/16 >> used on 26 of 30 nights with average 6 hrs 32 min.  Average AHI 5.5 with median CPAP 8 and 95 th percentile CPAP 11 cm H2O ONO with CPAP 04/10/16 >> test time 6 hrs 30 min.  Mean SpO2 93.51%, low SpO2 77%.  Spent 7 min with SpO2 < 88%   Past medical history Ankylosing spondylitis, Allergies, CAD, HLD, HTN, Nephrolithiasis, Malaria      Physical Exam  amb hoarse wm nad  02/13/2018         190  11/17/2017  186  05/20/2017       190  01/15/2017         183  12/12/2016         186  10/17/2016         183  10/10/2016         187  07/30/2016     191  07/22/16 194 lb (88 kg)  07/15/16 196 lb (88.9 kg)  07/12/16 192 lb 9.6 oz (87.4 kg)     Vital signs reviewed - Note on arrival 02 sats  92% on RA       HEENT: nl dentition, turbinates bilaterally, and oropharynx. Nl external ear canals without cough reflex   NECK :  without JVD/Nodes/TM/ nl carotid upstrokes bilaterally   LUNGS: no acc muscle use,  Nl contour chest with mostly upper airway wheezing, some rhonchi as well bilaterally   CV:  RRR  no s3 or murmur or increase in P2, and no edema   ABD:  soft and nontender with nl inspiratory excursion in the supine position. No bruits or organomegaly appreciated, bowel sounds nl  MS:  Nl gait/ ext warm without deformities, calf tenderness, cyanosis or clubbing No obvious joint restrictions   SKIN: warm and dry without lesions    NEURO:  alert, approp, nl sensorium with  no motor or cerebellar deficits apparent.               Assessment/Plan

## 2018-02-14 ENCOUNTER — Encounter: Payer: Self-pay | Admitting: Internal Medicine

## 2018-02-14 NOTE — Assessment & Plan Note (Signed)
07/22/2016    > try reduce symbicort to 80 2bid  - FENO 07/15/2016  =   42 on sym 160 2bid  - 07/30/2016  After extensive coaching HFA effectiveness =    90% > continue symb 80 2bid  - Sinus CT 07/31/2016 >>> neg - FENO 10/10/2016  =   12 during flare while on symb 80 2bid - PFT's  10/17/2016  FEV1 2.40 (89 % ) ratio 87  p 0 % improvement from saba p symb 80 x 2 prior to study with DLCO  60/58 % corrects to 90 % for alv volume and erv =50%  - Allergy profile 10/17/2016 >  Eos 0.0 /  IgE 55 Rast pos dust only   - 10/17/2016  Added singulair 10 mg  - HRCT chest 11/2016  -NSIP Changes/Bronchiectasis noted  - 12/12/2016 spirometry s obst with active symptoms >> trial off symbicort as not convinced helping - 01/13/17 trial of dymista > improved 05/19/2017 but did not continue it due to cost  - 11/10/17 d/c'd ppi and did not refill > 11/17/17 rec ok to leave off but restart prilosec for cough  - 02/07/18 recurrent severe cough on last day of [place] cruise > did not follow action plan    Of the three most common causes of  Sub-acute / recurrent or chronic cough, only one (GERD)  can actually contribute to/ trigger  the other two (asthma and post nasal drip syndrome)  and perpetuate the cylce of cough.  While not intuitively obvious, many patients with chronic low grade reflux do not cough until there is a primary insult that disturbs the protective epithelial barrier and exposes sensitive nerve endings.   This is typically viral but can due to PNDS and  either may apply here.    The point is that once this occurs, it is difficult to eliminate the cycle  using anything but a maximally effective acid suppression regimen at least in the short run, accompanied by an appropriate diet to address non acid GERD and control / eliminate  pnds with 1st gen H1 blockers per guidelines    For now rx zpak/ pred x 6 days emprically and f/u in a week if not back to baseline   I had an extended discussion with the patient reviewing all  relevant studies completed to date and  lasting 15 to 20 minutes of a 25 minute acute office visit    Each maintenance medication was reviewed in detail including most importantly the difference between maintenance and prns and under what circumstances the prns are to be triggered using an action plan format that is not reflected in the computer generated alphabetically organized AVS but trather by a customized med calendar that reflects the AVS meds with confirmed 100% correlation.   In addition, Please see AVS for unique instructions that I personally wrote and verbalized to the the pt in detail and then reviewed with pt  by my nurse highlighting any  changes in therapy recommended at today's visit to their plan of care.

## 2018-02-16 ENCOUNTER — Other Ambulatory Visit: Payer: Self-pay

## 2018-02-16 MED ORDER — SIMVASTATIN 20 MG PO TABS: ORAL_TABLET | ORAL | 1 refills | Status: DC

## 2018-02-17 ENCOUNTER — Telehealth: Payer: Self-pay | Admitting: Internal Medicine

## 2018-02-17 MED ORDER — PREDNISONE 10 MG PO TABS: ORAL_TABLET | ORAL | 0 refills | Status: DC

## 2018-02-17 NOTE — Telephone Encounter (Signed)
Called spoke with patient who reported that his symptoms are no better since 7.5.19 office visit with MW.  Pt stated the cough is still productive with clear mucus, having some wheezing and increased SOB.  Patient is taking his Ventolin every 4 hours with little relief.  Cough is keeping him up at night.  Reports some chills and sweats, but no fever.  Denies any chest pain or hemoptysis.  Patient has 1 dose of azithromycin left for tomorrow  Patient misunderstood prednisone instructions - stated that he was not given enough prednisone by the pharmacy to allow for 4 tabs x2 days, 2 tabs x2 days, 1 tab x2 days.  Began pred taper at 2 tabs daily x2 days and is now taking 1 tab daily.  Dr Melvyn Novas please advise, thank you.  Per 7.5.19 office visit w/ MW: Patient Instructions  zpak Prednisone 10 mg take  4 each am x 2 days,   2 each am x 2 days,  1 each am x 2 days and stop      See calendar for specific medication instructions and bring it back for each and every office visit for every healthcare provider you see.  Without it,  you may not receive the best quality medical care that we feel you deserve.   You will note that the calendar groups together  your maintenance  medications that are timed at particular times of the day.  Think of this as your checklist for what your doctor has instructed you to do until your next evaluation to see what benefit  there is  to staying on a consistent group of medications intended to keep you well.  The other group at the bottom is entirely up to you to use as you see fit  for specific symptoms that may arise between visits that require you to treat them on an as needed basis.  Think of this as your action plan or "what if" list.    Separating the top medications from the bottom group is fundamental to providing you adequate care going forward.     Call first of next week if not improving

## 2018-02-17 NOTE — Telephone Encounter (Signed)
Patient returned call, CB [phone].

## 2018-02-17 NOTE — Telephone Encounter (Signed)
LMOMTCB x 1 

## 2018-02-17 NOTE — Telephone Encounter (Signed)
Pt called back, aware of recs.  pred taper sent to pharmacy.  Ov scheduled tomorrow morning.  Nothing further needed.

## 2018-02-17 NOTE — Telephone Encounter (Signed)
lmtcb for pt.  

## 2018-02-17 NOTE — Telephone Encounter (Signed)
Ok to try Prednisone 10 mg take  4 each am x 2 days,   2 each am x 2 days,  1 each am x 2 days and stop but needs ov by end of week with med calendar and all meds in hand if not improving - ok to see NP or add on to end of my schedule any afternoon

## 2018-02-18 ENCOUNTER — Telehealth: Payer: Self-pay | Admitting: Internal Medicine

## 2018-02-18 ENCOUNTER — Ambulatory Visit (INDEPENDENT_AMBULATORY_CARE_PROVIDER_SITE_OTHER): Payer: Medicare Other | Admitting: Internal Medicine

## 2018-02-18 ENCOUNTER — Encounter: Payer: Self-pay | Admitting: Internal Medicine

## 2018-02-18 VITALS — BP 108/62 | HR 57 | Temp 98.3°F | Ht 67.0 in | Wt 186.0 lb

## 2018-02-18 DIAGNOSIS — I25119 Atherosclerotic heart disease of native coronary artery with unspecified angina pectoris: Secondary | ICD-10-CM | POA: Diagnosis not present

## 2018-02-18 DIAGNOSIS — J45991 Cough variant asthma: Secondary | ICD-10-CM

## 2018-02-18 LAB — NITRIC OXIDE: NITRIC OXIDE: 11

## 2018-02-18 MED ORDER — BUDESONIDE-FORMOTEROL FUMARATE 80-4.5 MCG/ACT IN AERO: 2.0000 | INHALATION_SPRAY | Freq: Two times a day (BID) | RESPIRATORY_TRACT | 0 refills | Status: DC

## 2018-02-18 MED ORDER — MEPERIDINE HCL 50 MG PO TABS: ORAL_TABLET | ORAL | 0 refills | Status: DC

## 2018-02-18 MED ORDER — PANTOPRAZOLE SODIUM 40 MG PO TBEC: 40.0000 mg | DELAYED_RELEASE_TABLET | Freq: Every day | ORAL | 2 refills | Status: DC

## 2018-02-18 NOTE — Patient Instructions (Addendum)
Symbicort 80 Take 2 puffs first thing in am and then another 2 puffs about 12 hours later.  Work on inhaler technique:  relax and gently blow all the way out then take a nice smooth deep breath back in, triggering the inhaler at same time you start breathing in.  Hold for up to 5 seconds if you can. Blow out thru nose. Rinse and gargle with water when done    Resume protonix 40 mg Take 30-60 min before first meal of the day   When coughing  1) add prilosec 20mg  30 min before your last meal 2) Take mucienx dm 1200 mg every 12 hours and cough into flutter valve and supplement if needed with  Demerol 50 mg up to 1 every 4 hours to suppress the urge to cough. Swallowing water and/or using ice chips/non mint and menthol containing candies (such as lifesavers or sugarless jolly ranchers) are also effective.  You should rest your voice and avoid activities that you know make you cough.  Once you have eliminated the cough for 3 straight days try reducing the demerol  first,  then the mucinex dm as tolerated.    Try to resume the cpap if you can tolerate it - the demerol will help   If not doing better go ahead and use the prednisone dose pack    See Tammy NP w/in 2 weeks or first available with all your medications, even over the counter meds, separated in two separate bags, the ones you take no matter what vs the ones you stop once you feel better and take only as needed when you feel you need them.   Tammy  will generate for you a new user friendly medication calendar that will put [place] all on the same page re: your medication use.

## 2018-02-18 NOTE — Telephone Encounter (Signed)
Called and spoke with patients wife, advised her that this was for the cough. Understanding verbalized. Nothing further needed.

## 2018-02-18 NOTE — Telephone Encounter (Signed)
Patient just seen 02/18/18 by [name]: When coughing  1) add prilosec 20mg  30 min before your last meal 2) Take mucienx dm 1200 mg every 12 hours and cough into flutter valve and supplement if needed with  Demerol 50 mg up to 1 every 4 hours to suppress the urge to cough. Swallowing water and/or using ice chips/non mint and menthol containing candies (such as lifesavers or sugarless jolly ranchers) are also effective.  You should rest your voice and avoid activities that you know make you cough.  ------- [name] and spoke with pharmacist [name] who reported that per [facility] protocols, anytime a script is received for pain medication they must call for the clinical indication of the drug.  Read to [name] [name] portion of the AVS above [name] voiced her understanding and made note of it   Nothing further needed; will sign off

## 2018-02-18 NOTE — Progress Notes (Signed)
Brief patient profile:  24   yowm never smoker with ankylosing spondilitis  with new onset variable  cough since Oct 2016 comes and goes s background of asthma but with background of nasal allergies for which took shots and moved residence in 1980s  >  No further problems except nasal drip /[place]neezing [place] then fine [place] stopped going there.    History of Present Illness  Congestion in chest x Oct 2016 worse in evenings with mostly dry coughing and much worse x sev weeks > [facility] eval on 07/15/16 cough for a week/ albterol and pred no better  rec Advair one puff twice per day until sample completed >> call if you feel you need a refill after this Zithromax 250 mg pill >> 2 pills on day 1, then 1 pill daily for next four days Albuterol 2 puffs every 4 to 6 hours as needed for cough, wheeze, or chest congestion Finish course of prednisone    07/22/2016 acute extended ov/Annaliah Rivenbark re:  Refractory cough > sob on advair and pred taper and getting worse Chief Complaint  Patient presents with  . Acute Visit    Increased SOB and cough are not improving since acute ov here 07/15/16. His cough has been occ prod with light colored sputum.  He states that his breathing does okay in the am's and then gets worse in the evenings.   on advair dpi/ benzoate and took pred 10 mg one tablet  This am and cough seems ok when he wakes up and progressively worse with Minimal actual sputum production as the day goes on and then does not really bother him while sleeping.. Shaking on details of care/ names of meds/ no better with saba  rec Take delsym two tsp every 12 hours and supplement if needed with  tramadol 50 mg up to 1 every 4 hours  .   Symbicort 80 Take 2 puffs first thing in am and then another 2 puffs about 12 hours later - if helping refill it Only use your albuterol (ventolin) inhaler  As long as coughing at all:  Try prilosec otc 20mg   Take 30-60 min before first meal of the day  and Pepcid ac (famotidine) 20 mg one @  bedtime until cough is completely gone for at least a week without the need for cough suppression GERD diet    07/30/2016 acute extended ov/Kiran Carline re: refractory cough since late Nov 2017  Chief Complaint  Patient presents with  . Acute Visit    Pt c/o increased cough and congestion for the past 2 wks. He states symptoms get worse in the evening. Cough is prod in the am's with thick, clear sputum.  He states he can not lie down flat due to cough and cough sometimes waked him up. He also c/o wheezing.   brought all his meds, not using symb 2bid per count, only missing 11 tramadol "it's for pain, right? "  Some better while on pred, worse since finished it/ only taking prilosec otc 20 mg qam / is taking pepcid 20 mg hs  Not using any saba "it's for emergencies, right?"  rec Prednisone 10 mg take  4 each am x 2 days,   2 each am x 2 days,  1 each am x 2 days and stop  Increase the prilosec ac to where you take x 2 30-60 mn before breakfast  And continue the pepcid ac 20mg  in evening  Continue symbicort 80 (  same as dulera 100 sample) Take 2 puffs first thing in am and then another 2 puffs about 12 hours later.  Only use your albuterol as a rescue medication  For cough use mucinex dm 1200 mg every 12 hours and supplement with the tramadol up to 2 every 4 hours until no cough x 3 days  Please see patient coordinator before you leave today  to schedule sinus CT    08/21/16  Restart Symbicort 2 puffs Twice daily  .  Restart Prilosec 20mg  daily before meal  Restart Pepcid 20mg  At bedtime   May use Zyrtec 10mg  At bedtime  As needed  Drainage  Zpack take as directed.  Mucinex DM Twice daily  As needed  Cough/congestion .     10/10/2016 acute extended ov/Kristina Bertone re:  uacs vs asthma  maint on symb 80 2bid / gerd rx with otcs only  Chief Complaint  Patient presents with  . Acute Visit    Pt c/o cough and increased SOB x 1 wk, and progressively worse over the past  3-4 days. His cough is prod with thick, clear sputum. He states can not lie down flat without cough.   sensation of something stuck never gone away since = oct 2016 then acutely worse sob/ cough x 1 week prior to OV  And not better p saba Does not have mucinex dm / no zyrtec  rec First take delsym two tsp every 12 hours and supplement if needed with  tramadol 50 mg up to 2 every 4 hours to suppress the urge to cough at all or even clear your throat. Swallowing water or using ice chips/non mint and menthol containing candies (such as lifesavers or sugarless jolly ranchers) are also effective.  You should rest your voice and avoid activities that you know make you cough. Once you have eliminated the cough for 3 straight days try reducing the tramadol first,  then the delsym as tolerated.   Prednisone 10 mg take  4 each am x 2 days,   2 each am x 2 days,  1 each am x 2 days and stop (this is to eliminate allergies and inflammation from coughing) Protonix (pantoprazole) Take 30-60 min before first meal of the day and Pepcid 20 mg one bedtime plus chlorpheniramine 4 mg x 2 at bedtime (both available over the counter)  until cough is completely gone for at least a week without the need for cough suppression For drainage / throat tickle try take CHLORPHENIRAMINE  4 mg - take one every 4 hours as needed - available over the counter- may cause drowsiness so start with just a bedtime dose or two and see how you tolerate it before trying in daytime   GERD diet  Stop prilosec/ mucinex  Please remember to go to the lab  department downstairs in the basement  for your tests - we will call you with the results when they are available.  Please schedule a follow up office visit in 2 weeks, sooner if needed  with all medications /inhalers/ solutions in hand so we can verify exactly what you are taking. This includes all medications from all doctors and over the counters Add did not got to lab as rec    10/17/2016  f/u  ov/Chazz Philson re: intermittent cough x oct 2016 def gets better on prednisone / sense of chest congestion but no excess mucus Chief Complaint  Patient presents with  . Follow-up    PFT's done today.  symptoms of  daily cough  worse p supper Did not bring meds as requested / not clear he even read them when reading them back line by line  Not using any albuterol "doesn't help"  Not able to lie down due to cough x month x no better even while on prednisone though "80% better" on pred vs off it/ no longer using cpap rec Add singulair each evening 10 daily trial basis  Please remember to go to the lab  department downstairs in the basement  for your tests - we will call you with the results when they are available. See Tammy NP in 4  weeks with all your medications, same page with your medication use she will arrange follow up with me or with allergy depending on your tests  Add : consider adding flutter if doesn't have one and HRCT looking for bronchiectasis      11/15/16 NP eval May stop  Tramadol  Use Delsym 2 tsp every 12hr for coughing .  Use Mucinex Twice daily  For congestion and mucus.  Call back with formulary options for Symbicort.  Follow med calendar closely and bring to each visit     12/12/2016  f/u ov/Aurorah Schlachter re:  Chronic cough/ bronchiectasis / not convinced symb 80 helping  Chief Complaint  Patient presents with  . Follow-up    Breathing is "fine". He still c/o chest congestion. He is not coughing much but when he does it is prod with cream colored sputum.    rec Stop mucinex / delsym and symbicort and just use ventolin as needed  Work on inhaler technique:  relax and gently blow all the way out then take a nice smooth deep breath back in, triggering the inhaler at same time you start breathing in.  Hold for up to 5 seconds if you can. Blow out thru nose. Rinse and gargle with water when done Add mucinex dm 1200 mg twice daily and flutter valve as much as you can  Flonase twice  daily each nostril See calendar for specific medication instruction     01/13/2017  f/u ov/Malik Ruffino re: chonic  cough since oct 2016 / documented bronchiectasis / not using med calendar  Chief Complaint  Patient presents with  . Follow-up    Still has some prod cough with clear sputum.   at hs takes 2 h1 and sleeps fine, no am flares but as soon as stirs experiences sense of pnds s  excess/ purulent sputum or mucus plugs  . No need for more saba off symbicort  rec Try dymista       11/17/2017  f/u ov/Korban Shearer re:  Cough x 2016 better  And ? Whether to continue ppi/ singulair  Chief Complaint  Patient presents with  . Follow-up    no voiced concerns  Dyspnea:  Not limited by breathing from desired activities   Cough: none Sleep: ok SABA use: none rec In the event the cough returns on your present meds, resume Prilosec 20 mg Take 30-60 min before first meal of the day  If you are doing great into the summer months ok to try off the montelukast to see what difference if any this makes and if worse sinus symptoms or cough then resume daily  See your orthopedic doctor about your heal ? Developing plantar fasciitis? > better p steroid injection      02/13/2018  f/u ov/Shari Natt re:  Recurrent severe cough while on last day of cruise to [place] from [place] 6/23-6/30/19  Chief  Complaint  Patient presents with  . Acute Visit    Pt c/o cough with clear sputum, wheezing and increased SOB x 3 days. He states his chest hurts when he coughs.   onset cough/ wheeze abrupt on 02/07/18 > mucus is clear 24/7  / sob just with activity  Not using any ventolin yet nor did he activate any of the action plans on his medcalendar which he initially denied receiving but found on his iphone p searching. Sleeping in recliner  Assoc with gen chest tightness/ chest discomfort with cough and  overt HB on just pepcid 20 mg hs  rec zpak Prednisone 10 mg take  4 each am x 2 days,   2 each am x 2 days,  1 each am x 2 days and  stop Follow med cal    02/18/2018  f/u ov/Rowyn Spilde re: refractory cough since 02/07/18  Chief Complaint  Patient presents with  . Acute Visit    increased SOB, sweats, chest congestion, cough- non prod.   Dyspnea:  MMRC1 = can walk nl pace, flat grade, can't hurry or go uphills or steps s sob   Cough: dry worse p supper then continues thru the noct Sleeping: difficult due to cough and sits up and used saba 3 am on day of ov  - off cpap for 6 months SABA use: once a day     No obvious day to day or daytime variability or assoc excess/ purulent sputum or mucus plugs or hemoptysis or cp or chest tightness, subjective wheeze or overt sinus or hb symptoms.    Also denies any obvious fluctuation of symptoms with weather or environmental changes or other aggravating or alleviating factors except as outlined above   No unusual exposure hx or h/o childhood pna/ asthma or knowledge of premature birth.  Current Allergies, Complete Past Medical History, Past Surgical History, Family History, and Social History were reviewed in [employer] record.  ROS  The following are not active complaints unless bolded Hoarseness, sore throat, dysphagia, dental problems, itching, sneezing,  nasal congestion or discharge of excess mucus or purulent secretions, ear ache,   fever, chills, sweats, unintended wt loss or wt gain, classically pleuritic or exertional cp,  orthopnea pnd or arm/hand swelling  or leg swelling, presyncope, palpitations, abdominal pain, anorexia, nausea, vomiting, diarrhea  or change in bowel habits or change in bladder habits, change in stools or change in urine, dysuria, hematuria,  rash, arthralgias, visual complaints, headache, numbness, weakness or ataxia or problems with walking or coordination,  change in mood or  memory.        Current Meds  Medication Sig  . albuterol (VENTOLIN HFA) 108 (90 Base) MCG/ACT inhaler Inhale 2 puffs into the lungs every 6 (six) hours as  needed for wheezing or shortness of breath.  [name] amLODipine (NORVASC) 5 MG tablet Take 5 mg by mouth daily.  [name] Apoaequorin (PREVAGEN PO) Take 1 tablet by mouth daily.   [name] aspirin 81 MG EC tablet Take 81 mg by mouth daily.   . chlorpheniramine (CHLOR-TRIMETON) 4 MG tablet Take 8 mg by mouth at bedtime.   [name] dextromethorphan (DELSYM) 30 MG/5ML liquid Take by mouth as needed for cough.  . dextromethorphan-guaiFENesin (MUCINEX DM) 30-600 MG 12hr tablet Take 2 tablets by mouth 2 (two) times daily as needed for cough.  . famotidine (PEPCID) 20 MG tablet Take 20 mg by mouth at bedtime.  . montelukast (SINGULAIR) 10 MG tablet Take 1 tablet (10  mg total) by mouth at bedtime.  . Multiple Vitamins-Minerals (ICAPS AREDS 2) CAPS Take by mouth daily.  [name] omeprazole (PRILOSEC) 20 MG capsule Take 20 mg by mouth daily.  [name] Respiratory Therapy Supplies (FLUTTER) DEVI Use as directed  . simvastatin (ZOCOR) 20 MG tablet TAKE 1 TABLET EVERY DAY WITH BREAKFAST               Pulmonary tests CT angio chest 11/30/15 >> motion artifact, mosaic pattern, calcified granuloma SNIFF test 11/30/15 >> elevated Rt diaphragm, but has b/l diaphragm movement  Cardiac tests 10/16/15 Doppler legs b/l >> no DVT 11/30/15 Echo >> EF 65 to 22%, grade 1 diastolic CHF  Sleep tests HST 12/21/15 >> AHI 44.5, SpO2 low 64% Auto CPAP 03/02/16 to 03/31/16 >> used on 26 of 30 nights with average 6 hrs 32 min.  Average AHI 5.5 with median CPAP 8 and 95 th percentile CPAP 11 cm H2O ONO with CPAP 04/10/16 >> test time 6 hrs 30 min.  Mean SpO2 93.51%, low SpO2 77%.  Spent 7 min with SpO2 < 88%   Past medical history Ankylosing spondylitis, Allergies, CAD, HLD, HTN, Nephrolithiasis, Malaria      Physical Exam    02/18/2018       186  02/13/2018         190  11/17/2017         186  05/20/2017       190  01/15/2017         183  12/12/2016         186  10/17/2016         183  10/10/2016         187  07/30/2016     191  07/22/16 194 lb (88 kg)   07/15/16 196 lb (88.9 kg)  07/12/16 192 lb 9.6 oz (87.4 kg)     Vital signs reviewed - Note on arrival 02 sats  95% on RA     amb wm hopeless affect    HEENT: nl dentition, turbinates bilaterally, and oropharynx.     NECK :  without JVD/Nodes/TM/ nl carotid upstrokes bilaterally   LUNGS: no acc muscle use,  Nl contour chest with mostly upper airway rhonchi/ cough on insp and exp    CV:  RRR  no s3 or murmur or increase in P2, and no edema   ABD:  soft and nontender with nl inspiratory excursion in the supine position. No bruits or organomegaly appreciated, bowel sounds nl  MS:  Nl gait/ ext warm without deformities, calf tenderness, cyanosis or clubbing No obvious joint restrictions   SKIN: warm and dry without lesions    NEURO:  alert, approp, nl sensorium with  no motor or cerebellar deficits apparent.             Assessment/Plan

## 2018-02-20 ENCOUNTER — Encounter: Payer: Self-pay | Admitting: Internal Medicine

## 2018-02-20 NOTE — Assessment & Plan Note (Addendum)
07/22/2016    > try reduce symbicort to 80 2bid  - FENO 07/15/2016  =   42 on sym 160 2bid  - 07/30/2016  After extensive coaching HFA effectiveness =    90% > continue symb 80 2bid  - Sinus CT 07/31/2016 >>> neg - FENO 10/10/2016  =   12 during flare while on symb 80 2bid - PFT's  10/17/2016  FEV1 2.40 (89 % ) ratio 87  p 0 % improvement from saba p symb 80 x 2 prior to study with DLCO  60/58 % corrects to 90 % for alv volume and erv =50%  - Allergy profile 10/17/2016 >  Eos 0.0 /  IgE 55 Rast pos dust only   - 10/17/2016  Added singulair 10 mg  - HRCT chest 11/2016  -NSIP Changes/Bronchiectasis noted  - 12/12/2016 spirometry s obst with active symptoms >> trial off symbicort as not convinced helping - 01/13/17 trial of dymista > improved 05/19/2017 but did not continue it due to cost  - 11/10/17 d/c'd ppi and did not refill > 11/17/17 rec ok to leave off but restart prilosec for cough  - 02/07/18 recurrent severe cough on last day of [place] cruise > did not follow action plan   - FENO 02/18/2018  =   11 during flare p am prednisone - Spirometry 02/18/2018  No obst on no rx  Prior with active wheeze on exam  - The proper method of use, as well as anticipated side effects, of a metered-dose inhaler are discussed and demonstrated to the patient.    Despite very "noisy chest" this flare is typical of UACS/ cyclical cough   Of the three most common causes of  Sub-acute / recurrent or chronic cough, only one (GERD)  can actually contribute to/ trigger  the other two (asthma and post nasal drip syndrome)  and perpetuate the cylce of cough.  While not intuitively obvious, many patients with chronic low grade reflux do not cough until there is a primary insult that disturbs the protective epithelial barrier and exposes sensitive nerve endings.   This is typically viral but can due to PNDS and  either may apply here.   The point is that once this occurs, it is difficult to eliminate the cycle  using anything but a  maximally effective acid suppression regimen at least in the short run, accompanied by an appropriate diet to address non acid GERD and control / eliminate the cough itself for at least 3 days with demerol    I had an extended discussion with the patient reviewing all relevant studies completed to date and  lasting 15 to 20 minutes of a 25 minute acute office  visit    See device teaching which extended face to face time for this visit   Each maintenance medication was reviewed in detail including most importantly the difference between maintenance and prns and under what circumstances the prns are to be triggered using an action plan format that is not reflected in the computer generated alphabetically organized AVS.    Please see AVS for specific instructions unique to this visit that I personally wrote and verbalized to the the pt in detail and then reviewed with pt  by my nurse highlighting any  changes in therapy recommended at today's visit to their plan of care.   Needs ov for med reconciliation next as struggling with concept of maint vs prns.   To keep things simple, I have asked the patient  to first separate medicines that are perceived as maintenance, that is to be taken daily "no matter what", from those medicines that are taken on only on an as-needed basis and I have given the patient examples of both, and then return to see our NP to generate a  detailed  medication calendar which should be followed until the next physician sees the patient and updates it.

## 2018-02-24 DIAGNOSIS — D044 Carcinoma in situ of skin of scalp and neck: Secondary | ICD-10-CM | POA: Diagnosis not present

## 2018-03-03 ENCOUNTER — Encounter: Payer: Self-pay | Admitting: Adult Health

## 2018-03-03 ENCOUNTER — Ambulatory Visit (INDEPENDENT_AMBULATORY_CARE_PROVIDER_SITE_OTHER): Payer: Medicare Other | Admitting: Adult Health

## 2018-03-03 DIAGNOSIS — J479 Bronchiectasis, uncomplicated: Secondary | ICD-10-CM | POA: Diagnosis not present

## 2018-03-03 DIAGNOSIS — J45991 Cough variant asthma: Secondary | ICD-10-CM | POA: Diagnosis not present

## 2018-03-03 MED ORDER — BUDESONIDE-FORMOTEROL FUMARATE 80-4.5 MCG/ACT IN AERO: 2.0000 | INHALATION_SPRAY | Freq: Two times a day (BID) | RESPIRATORY_TRACT | 0 refills | Status: DC

## 2018-03-03 NOTE — Assessment & Plan Note (Signed)
Recent flare now improved on Symbicort and short course of steroids  Pt does have mild bronchiectasis and possible NSIP changes on CT chest.  Appears stable. Patient's medications were reviewed today and patient education was given. Computerized medication calendar was adjusted/completed  Will try of singulair , as may not be helping  Change chlor tab to As needed    Plan  Patient Instructions  May try off Montelukast .  Change Chlortabs to As needed  For drippy nose, drainage .  Follow med calendar closely and bring to each visit .  Follow up Dr. Melvyn Novas  In  3-4 months and As needed

## 2018-03-03 NOTE — Progress Notes (Signed)
Chart and office note reviewed in detail  > agree with a/p as outlined    

## 2018-03-03 NOTE — Patient Instructions (Addendum)
May try off Montelukast .  Change Chlortabs to As needed  For drippy nose, drainage .  Follow med calendar closely and bring to each visit .  Follow up Dr. Melvyn Novas  In  3-4 months and As needed

## 2018-03-03 NOTE — Progress Notes (Signed)
@Patient  ID: [name], male    DOB: Dec 22, 1940, 77 y.o.   MRN: [id number]  Chief Complaint  Patient presents with  . Medication Management    Asthma     Referring provider: [name], MD  HPI: 77 year old male never smoker with ankylosing spondylitis followed for chronic cough /cough variant asthma  , Mild bronchiectasis and possible NSIP on HRCT chest   TEST  - FENO 07/15/2016  =   42 on sym 160 2bid  - Sinus CT 07/31/2016 >>> neg - FENO 10/10/2016  =   12 during flare while on symb 80 2bid - PFT's  10/17/2016  FEV1 2.40 (89 % ) ratio 87  p 0 % improvement from saba p symb 80 x 2 prior to study with DLCO  60/58 % corrects to 90 % for alv volume and erv =50%  - Allergy profile 10/17/2016 >  Eos 0.0 /  IgE 55 Rast pos dust only   - HRCT chest 11/2016  -NSIP Changes/Bronchiectasis noted  - 12/12/2016 spirometry s obst with active symptoms >> trial off symbicort as not convinced helping - FENO 02/18/2018  =   11 during flare p am prednisone - Spirometry 02/18/2018  No obst on no rx    03/03/2018 Follow up : Chronic cough /Asthma  Patient returns for a 2-week follow-up.Was seen last ov with Cough flare , tx with prednisone taper . Restarted on Symbicort . He says he is feeling better, cough is 95% better.  Says he only took couple of days of prednisone . He was given demerol but did not get it filled as he does not want to take an opoid medication . Cough is decreased and mucus production is better. Uses flutter valve and mucinex As needed    We reviewed all his medications and organize them into a medication calendar.  Patient education was given.  It appears he is taking his medication correctly.  Patient request if any medications can be discontinued to please look at this as he wants to simplify his medications as much as possible.   Allergies  Allergen Reactions  . Tramadol Other (See Comments)    Makes patient jumpy    Immunization History  Administered Date(s)  Administered  . Influenza Split 06/01/2012  . Influenza Whole 05/30/2009, 04/20/2010  . Influenza, High Dose Seasonal PF 05/19/2017  . Influenza-Unspecified 07/12/2013, 05/10/2016  . Pneumococcal Polysaccharide-23 08/22/2009  . Tdap 12/04/2010, 06/04/2011  . Zoster 05/17/2008    Past Medical History:  Diagnosis Date  . Abnormal chest x-ray   . Allergic rhinitis   . Ankylosing spondylitis ([place])   . Anxiety   . Arthritis   . Colonic polyp   . Coronary atherosclerosis of native coronary artery   . Cough   . Dizziness 10/21/2016  . Ganglion cyst of wrist    left  . Heart attack ([place]) 2008  . Hemorrhoids   . History of malaria   . Hyperlipidemia    with low HDL  . Hypertension   . Kidney stone   . Other disorder of muscle, ligament, and fascia   . Rotator cuff tear   . Swelling of lower extremity 10/16/2015    Tobacco History: Social History   Tobacco Use  Smoking Status Never Smoker  Smokeless Tobacco Never Used   Counseling given: Not Answered   Outpatient Medications Prior to Visit  Medication Sig Dispense Refill  . albuterol (VENTOLIN HFA) 108 (90 Base) MCG/ACT inhaler Inhale 2 puffs  into the lungs every 6 (six) hours as needed for wheezing or shortness of breath.    [name] amLODipine (NORVASC) 5 MG tablet Take 5 mg by mouth daily.    [name] Apoaequorin (PREVAGEN PO) Take 1 tablet by mouth daily.     [name] aspirin 81 MG EC tablet Take 81 mg by mouth daily.     . budesonide-formoterol (SYMBICORT) 80-4.5 MCG/ACT inhaler Inhale 2 puffs into the lungs 2 (two) times daily. 1 Inhaler 0  . chlorpheniramine (CHLOR-TRIMETON) 4 MG tablet Take 8 mg by mouth at bedtime.     [name] dextromethorphan (DELSYM) 30 MG/5ML liquid Take by mouth as needed for cough.    . dextromethorphan-guaiFENesin (MUCINEX DM) 30-600 MG 12hr tablet Take 2 tablets by mouth 2 (two) times daily as needed for cough.    . famotidine (PEPCID) 20 MG tablet Take 20 mg by mouth at bedtime.    . meperidine (DEMEROL) 50 MG tablet  One every 4 hours as needed for cough 30 tablet 0  . montelukast (SINGULAIR) 10 MG tablet Take 1 tablet (10 mg total) by mouth at bedtime. 30 tablet 11  . Multiple Vitamins-Minerals (ICAPS AREDS 2) CAPS Take by mouth daily.    [name] omeprazole (PRILOSEC) 20 MG capsule Take 20 mg by mouth daily.    . pantoprazole (PROTONIX) 40 MG tablet Take 1 tablet (40 mg total) by mouth daily. Take 30-60 min before first meal of the day 30 tablet 2  . Respiratory Therapy Supplies (FLUTTER) DEVI Use as directed 1 each 0  . simvastatin (ZOCOR) 20 MG tablet TAKE 1 TABLET EVERY DAY WITH BREAKFAST 90 tablet 1   No facility-administered medications prior to visit.      Review of Systems  Constitutional:   No  weight loss, night sweats,  Fevers, chills, fatigue, or  lassitude.  HEENT:   No headaches,  Difficulty swallowing,  Tooth/dental problems, or  Sore throat,                No sneezing, itching, ear ache,  +nasal congestion, post nasal drip,   CV:  No chest pain,  Orthopnea, PND, swelling in lower extremities, anasarca, dizziness, palpitations, syncope.   GI  No heartburn, indigestion, abdominal pain, nausea, vomiting, diarrhea, change in bowel habits, loss of appetite, bloody stools.   Resp:   No chest wall deformity  Skin: no rash or lesions.  GU: no dysuria, change in color of urine, no urgency or frequency.  No flank pain, no hematuria   MS:  No joint pain or swelling.  No decreased range of motion.  No back pain.    Physical Exam  BP 128/76 (BP Location: Right Arm, Cuff Size: Normal)   Pulse 80   Ht 5\' 7"  (1.702 m)   Wt 181 lb 12.8 oz (82.5 kg)   SpO2 96%   BMI 28.47 kg/m   GEN: A/Ox3; pleasant , NAD, elderly    HEENT:  [place]/AT,  EACs-clear, TMs-wnl, NOSE-clear, THROAT-clear, no lesions, no postnasal drip or exudate noted.   NECK:  Supple w/ fair ROM; no JVD; normal carotid impulses w/o bruits; no thyromegaly or nodules palpated; no lymphadenopathy.    RESP  Clear  P & A; w/o,  wheezes/ rales/ or rhonchi. no accessory muscle use, no dullness to percussion  CARD:  RRR, no m/r/g, no peripheral edema, pulses intact, no cyanosis or clubbing.  GI:   Soft & nt; nml bowel sounds; no organomegaly or masses detected.   Musco: Warm bil,  no deformities or joint swelling noted.   Neuro: alert, no focal deficits noted.    Skin: Warm, no lesions or rashes    Lab Results:  CBC  BMET  BNP  Imaging: No results found.   Assessment & Plan:   Cough variant asthma  vs UACS Recent flare now improved on Symbicort and short course of steroids  Pt does have mild bronchiectasis and possible NSIP changes on CT chest.  Appears stable. Patient's medications were reviewed today and patient education was given. Computerized medication calendar was adjusted/completed  Will try of singulair , as may not be helping  Change chlor tab to As needed    Plan  Patient Instructions  May try off Montelukast .  Change Chlortabs to As needed  For drippy nose, drainage .  Follow med calendar closely and bring to each visit .  Follow up Dr. Melvyn Novas  In  3-4 months and As needed        Bronchiectasis without complication ([place]) Appears stable  Cont w/ flutter valve      Rexene Edison, NP 03/03/2018

## 2018-03-03 NOTE — Assessment & Plan Note (Signed)
Appears stable  Cont w/ flutter valve

## 2018-03-04 ENCOUNTER — Encounter: Payer: Medicare Other | Admitting: Adult Health

## 2018-03-10 NOTE — Addendum Note (Signed)
Addended by: Della Goo C on: 03/10/2018 09:28 AM   Modules accepted: Orders

## 2018-03-21 IMAGING — DX DG CHEST 2V
2 series · 2 of 2 positions shown · non-contrast
Comparison: Chest radiograph 11/30/2015.

CLINICAL DATA: Persistent cough and shortness breath for the past 5
days.

EXAM:
CHEST  2 VIEW

[chest pa]
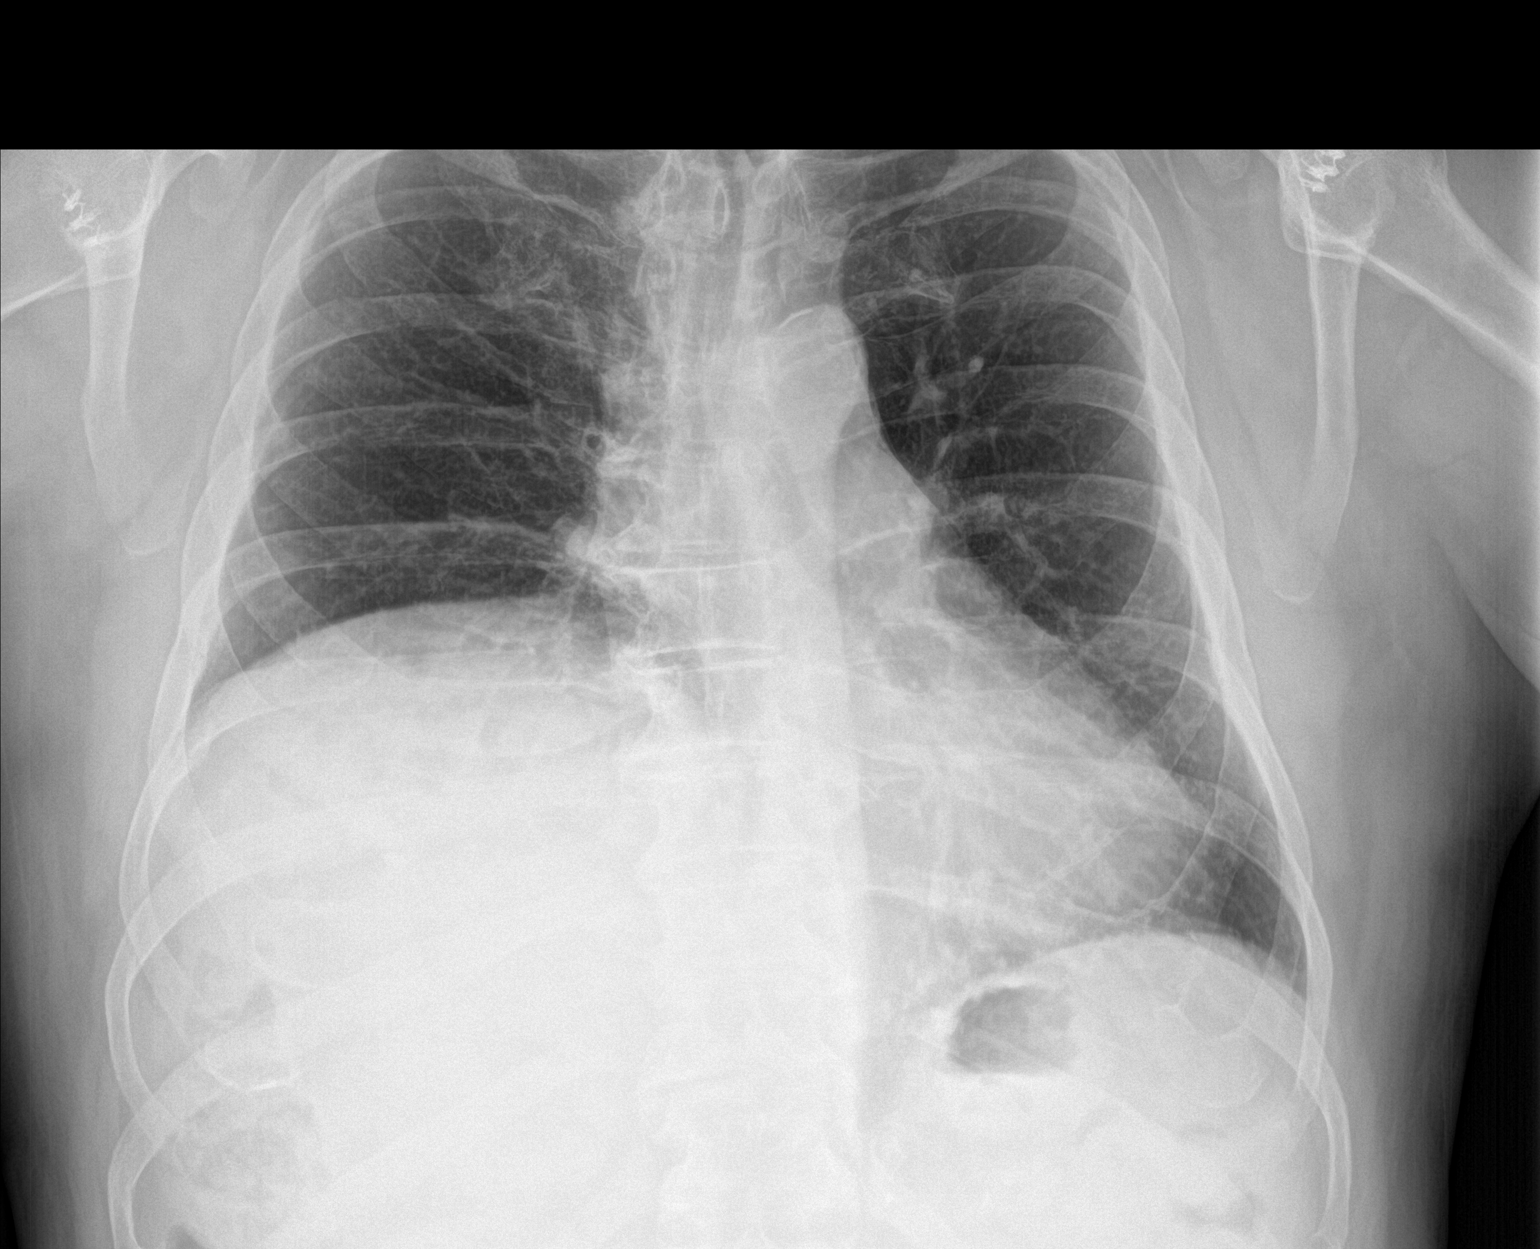

[chest lat]
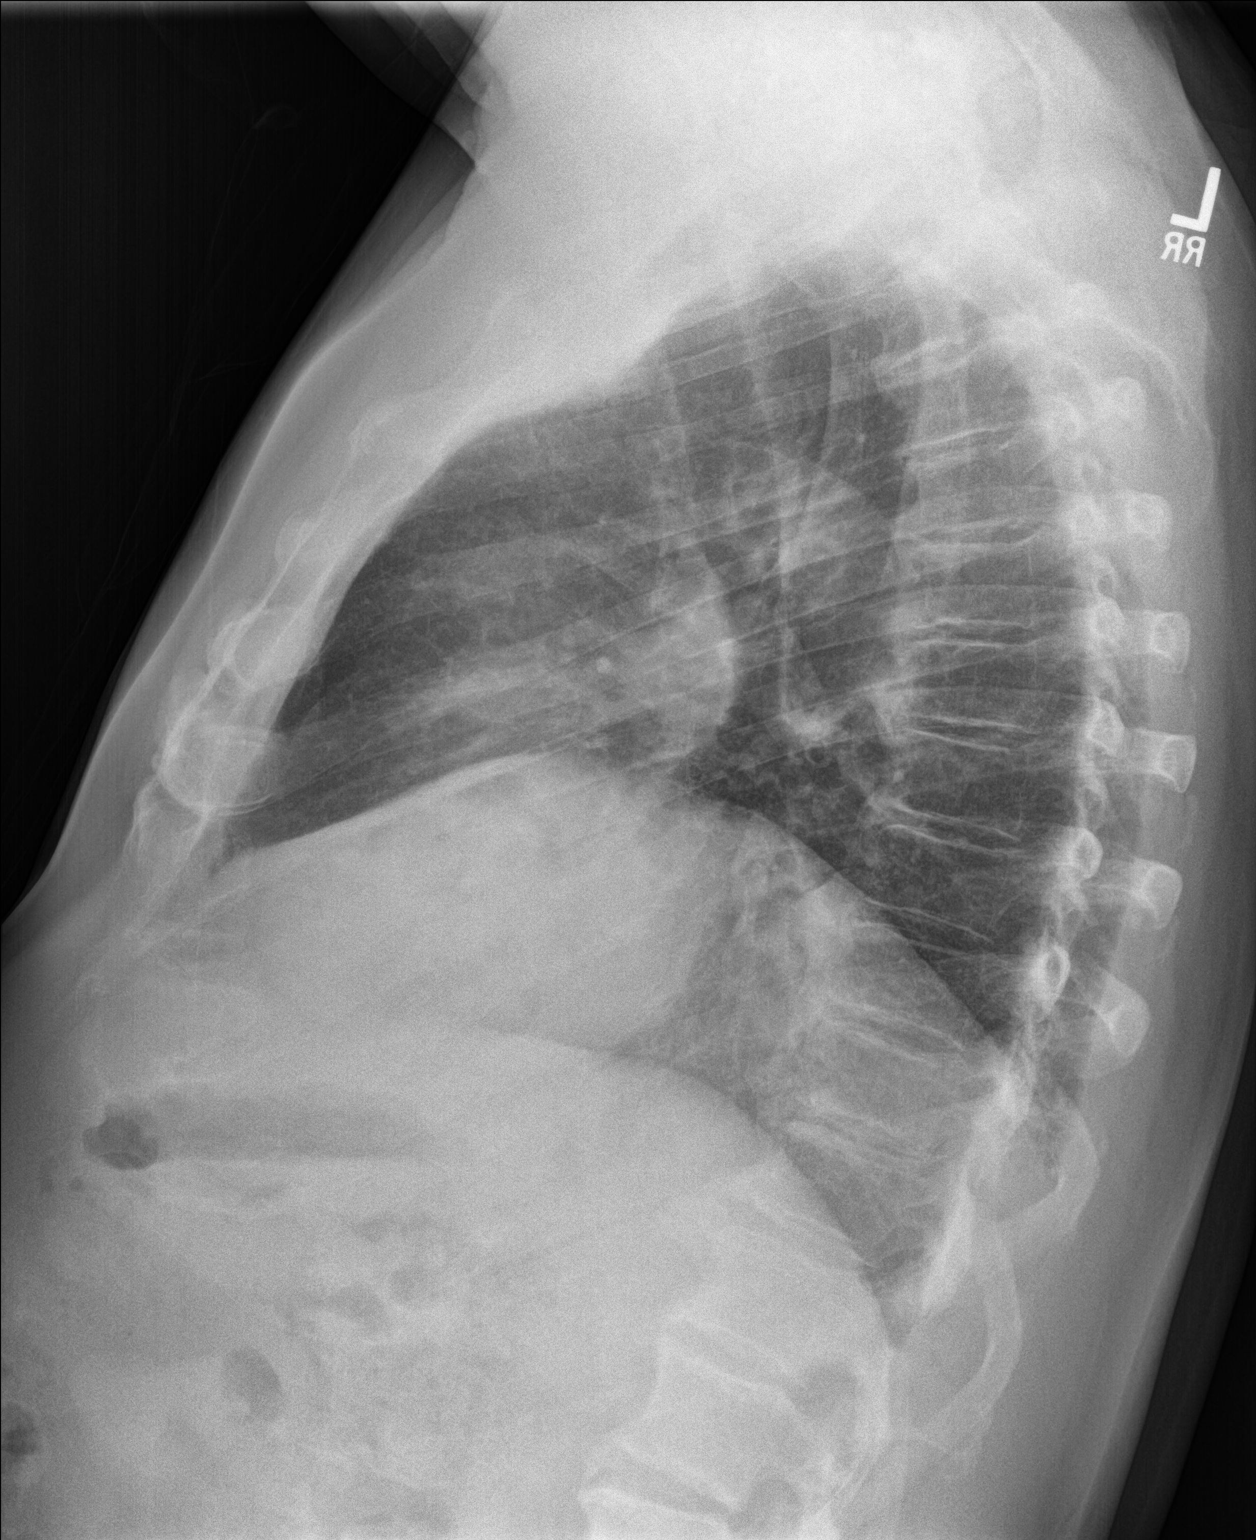

[2 of 2 positions shown; findings below may reference images not displayed]

FINDINGS: Chronic RIGHT hemidiaphragm elevation. No active infiltrates or
failure. Normal heart size. No pneumothorax. BILATERAL rotator cuff
repairs.
IMPRESSION: Stable chest.  No active disease.

## 2018-03-21 IMAGING — DX DG FEMUR 2+V*L*
3 series · 3 of 3 positions shown · non-contrast
Comparison: None.

CLINICAL DATA: Soft tissue mass with bruising.

EXAM:
LEFT FEMUR 2 VIEWS

[femur ap (1 of 2)]
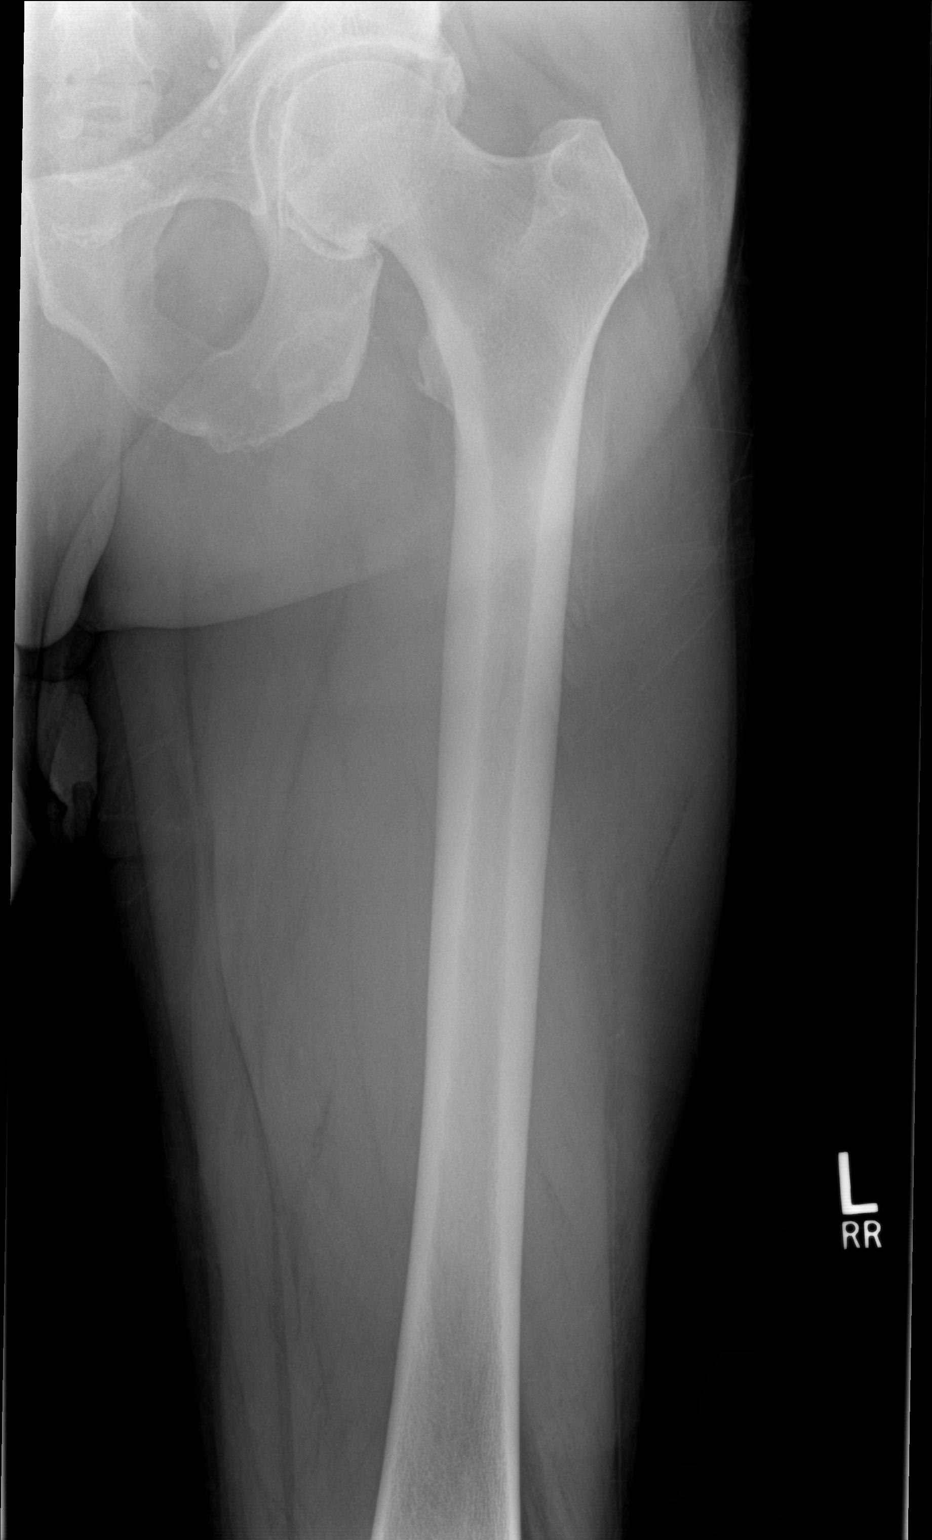

[femur ap (2 of 2)]
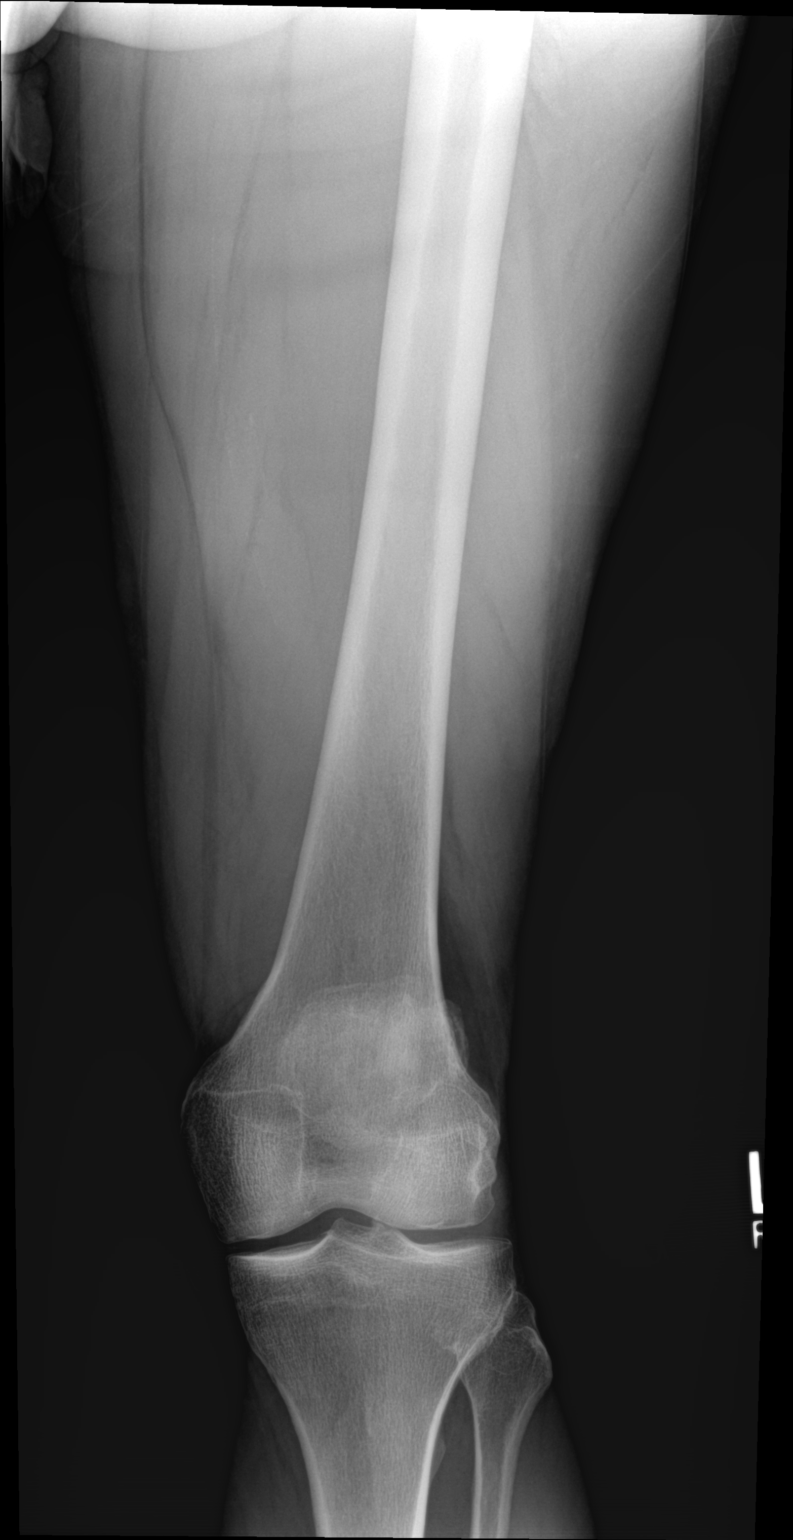

[femur lat]
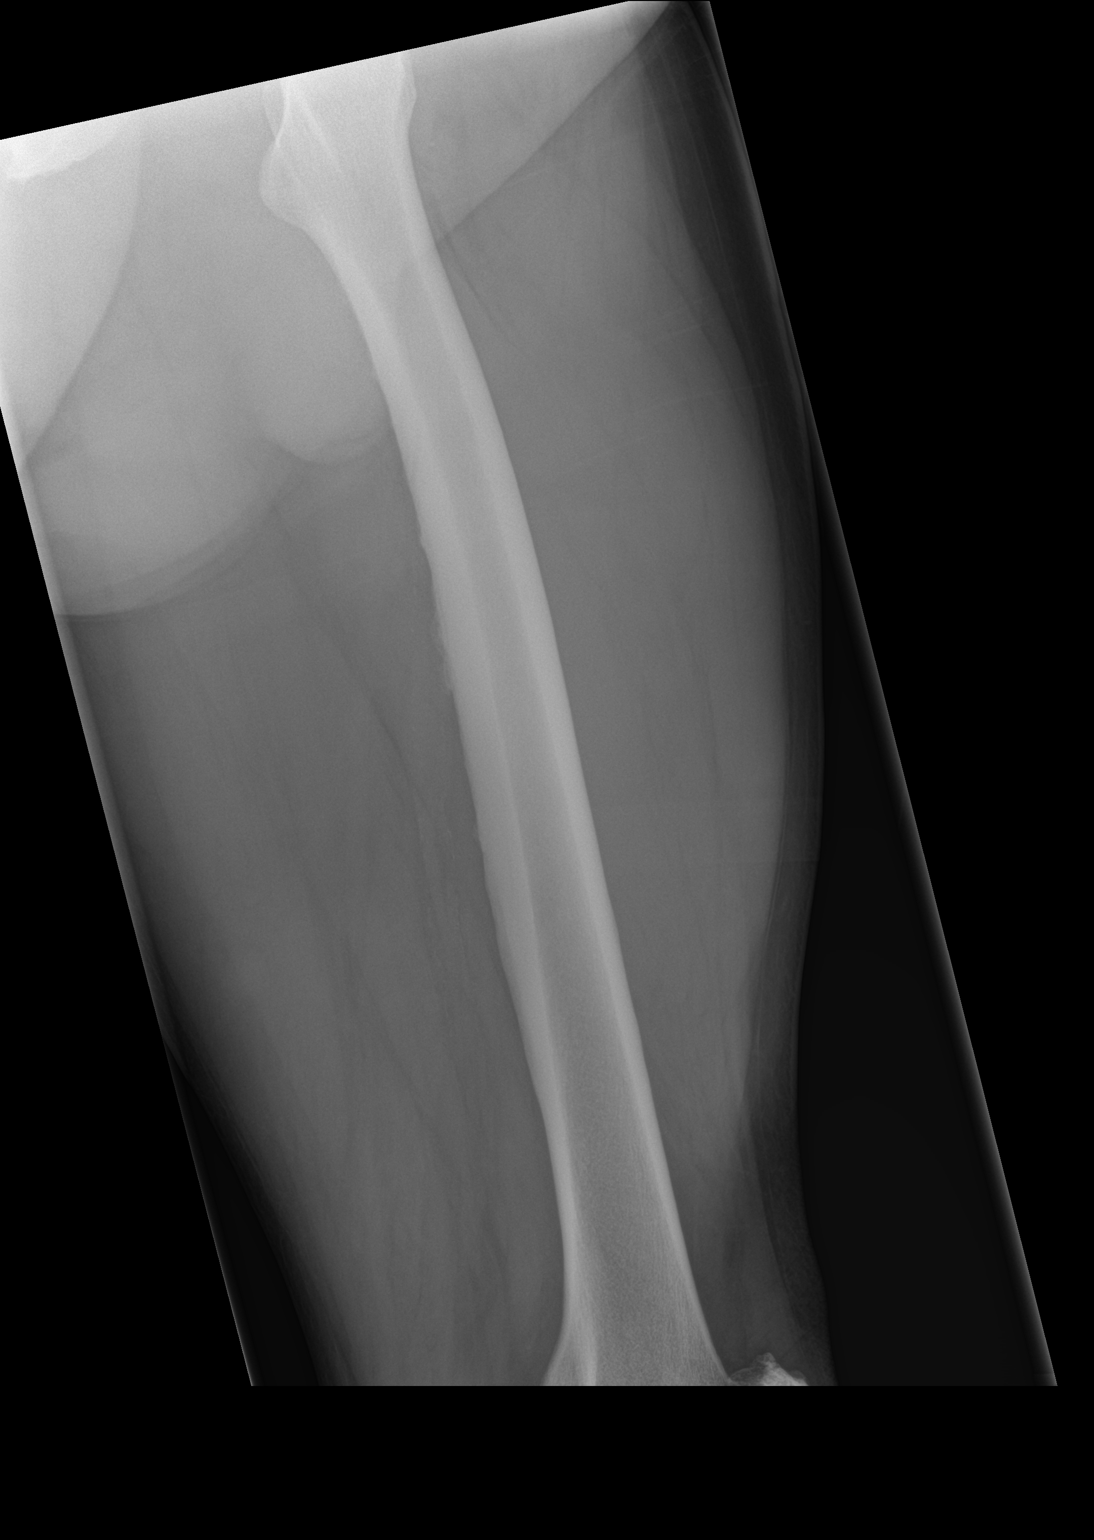

[3 of 3 positions shown; findings below may reference images not displayed]

FINDINGS: There is no evidence of fracture or other focal bone lesions. No
calcification of the soft tissues.
IMPRESSION: No acute osseous findings are seen. No calcification of the soft
tissues is evident.

## 2018-04-17 ENCOUNTER — Ambulatory Visit (INDEPENDENT_AMBULATORY_CARE_PROVIDER_SITE_OTHER): Payer: Medicare Other | Admitting: Physician Assistant

## 2018-04-17 ENCOUNTER — Other Ambulatory Visit: Payer: Self-pay

## 2018-04-17 ENCOUNTER — Encounter: Payer: Self-pay | Admitting: Physician Assistant

## 2018-04-17 VITALS — BP 116/70 | HR 62 | Temp 98.4°F | Resp 16 | Ht 66.0 in | Wt 183.2 lb

## 2018-04-17 DIAGNOSIS — R21 Rash and other nonspecific skin eruption: Secondary | ICD-10-CM

## 2018-04-17 DIAGNOSIS — L299 Pruritus, unspecified: Secondary | ICD-10-CM

## 2018-04-17 MED ORDER — PREDNISONE 20 MG PO TABS: ORAL_TABLET | ORAL | 0 refills | Status: DC

## 2018-04-17 MED ORDER — BACITRACIN 500 UNIT/GM EX OINT: 1.0000 "application " | TOPICAL_OINTMENT | Freq: Two times a day (BID) | CUTANEOUS | 0 refills | Status: DC

## 2018-04-17 MED ORDER — CAMPHOR-MENTHOL 0.5-0.5 % EX LOTN: 1.0000 "application " | TOPICAL_LOTION | CUTANEOUS | 0 refills | Status: DC | PRN

## 2018-04-17 NOTE — Patient Instructions (Addendum)
  Prednisone: take 3 pills for 3 days; then 2 pills for three days; then 1 pill for three days.  Sarna lotion is for itching.  Bacitracin is for your left calf.  These lesions may be consistent with flea bites. Please check your home for possible flea infestation.   Thank you for coming in today. I hope you feel we met your needs.  Feel free to call PCP if you have any questions or further requests.  Please consider signing up for MyChart if you do not already have it, as this is a great way to communicate with me.  Best,  Whitney McVey, PA-C  IF you received an x-ray today, you will receive an invoice from [facility]. Please contact [facility] at [phone] with questions or concerns regarding your invoice.   IF you received labwork today, you will receive an invoice from [place]. Please contact [facility] at [phone] with questions or concerns regarding your invoice.   Our billing staff will not be able to assist you with questions regarding bills from these companies.  You will be contacted with the lab results as soon as they are available. The fastest way to get your results is to activate your My Chart account. Instructions are located on the last page of this paperwork. If you have not heard from [place] regarding the results in 2 weeks, please contact this office.

## 2018-04-17 NOTE — Progress Notes (Signed)
[name]  MRN: [id number] DOB: [birth year]  PCP: [name], MD  Subjective:  Pt is a 77 year old male who presents to clinic for rash on legs x 2 weeks. Endorses itching.  He has been out in the yard working.  He has not taken anything to feel better.    Review of Systems  Respiratory: Negative for chest tightness and shortness of breath.   Gastrointestinal: Negative for abdominal pain, nausea and vomiting.  Skin: Positive for rash and wound.    Patient Active Problem List   Diagnosis Date Noted  . GERD (gastroesophageal reflux disease) 12/15/2017  . Unstable angina ([place]) 12/05/2017  . Angina at rest [facility]) 12/05/2017  . Atypical chest pain   . Dyslipidemia   . COPD not affecting current episode of care [facility])   . Essential hypertension 05/29/2017  . Abnormal nuclear cardiac imaging test 01/14/2017  . Bronchiectasis without complication ([place]) 24/23/5361  . ILD (interstitial lung disease) ([place]) 11/27/2016  . Dizziness 10/21/2016  . Cough variant asthma  vs UACS 07/22/2016  . Obstructive sleep apnea 12/01/2015  . Acute bronchitis 11/30/2015  . Swelling of lower extremity 10/16/2015  . Benign mole 02/04/2014  . Impaired glucose tolerance 10/26/2013  . NAFLD (nonalcoholic fatty liver disease) 04/27/2013  . Thrombocytopenia ([place]) 10/21/2012  . Exertional angina ([facility]) 09/03/2012  . MALARIA 12/05/2009  . COLONIC POLYPS 12/05/2009  . HYPERCHOLESTEROLEMIA 12/05/2009  . ANXIETY 12/05/2009  . HEMORRHOIDS 12/05/2009  . Allergic rhinitis 12/05/2009  . ANKYLOSING SPONDYLITIS 12/05/2009  . GANGLION CYST, WRIST, LEFT 12/05/2009  . ROTATOR CUFF TEAR 12/05/2009  . OTHER DISORDER OF MUSCLE LIGAMENT AND FASCIA 12/05/2009  . ABNORMAL CHEST XRAY 12/05/2009  . CAD (coronary artery disease) 04/05/2009    Current Outpatient Medications on File Prior to Visit  Medication Sig Dispense Refill  . amLODipine (NORVASC) 5 MG tablet Take 5 mg by mouth daily.    [name] Apoaequorin  (PREVAGEN PO) Take 1 tablet by mouth daily.     [name] aspirin 81 MG EC tablet Take 81 mg by mouth daily.     . famotidine (PEPCID) 20 MG tablet Take 20 mg by mouth at bedtime.    . Multiple Vitamins-Minerals (ICAPS AREDS 2) CAPS Take by mouth daily.    . pantoprazole (PROTONIX) 40 MG tablet Take 1 tablet (40 mg total) by mouth daily. Take 30-60 min before first meal of the day 30 tablet 2  . simvastatin (ZOCOR) 20 MG tablet TAKE 1 TABLET EVERY DAY WITH BREAKFAST 90 tablet 1  . albuterol (VENTOLIN HFA) 108 (90 Base) MCG/ACT inhaler Inhale 2 puffs into the lungs every 6 (six) hours as needed for wheezing or shortness of breath.    [name] amLODipine (NORVASC) 5 MG tablet Take 5 mg by mouth daily.    . Azelastine-Fluticasone (DYMISTA) 137-50 MCG/ACT SUSP Place into the nose.    . budesonide-formoterol (SYMBICORT) 80-4.5 MCG/ACT inhaler Inhale 2 puffs into the lungs 2 (two) times daily. (Patient not taking: Reported on 04/17/2018) 1 Inhaler 0   No current facility-administered medications on file prior to visit.     Allergies  Allergen Reactions  . Tramadol Other (See Comments)    Makes patient jumpy     Objective:  BP 116/70 (BP Location: Left Arm, Patient Position: Sitting, Cuff Size: Normal)   Pulse 62   Temp 98.4 F (36.9 C) (Oral)   Resp 16   Ht 5\' 6"  (1.676 m)   Wt 183 lb 3.2 oz (83.1 kg)  SpO2 96%   BMI 29.57 kg/m   Physical Exam  Constitutional: He appears well-developed and well-nourished.  Skin: Rash noted.  Vitals reviewed.     Assessment and Plan :  1. Rash and nonspecific skin eruption 2. Itching - plan to treat with prednisone and bacitracin. RTC if no improvement.    Mercer Pod, PA-C  [facility] 04/17/2018 8:21 AM  Please note: Portions of this report may have been transcribed using dragon voice recognition software. Every effort was made to ensure accuracy; however, inadvertent computerized transcription errors may be  present.

## 2018-04-23 ENCOUNTER — Telehealth: Payer: Self-pay

## 2018-04-23 ENCOUNTER — Inpatient Hospital Stay: Payer: Medicare Other

## 2018-04-23 ENCOUNTER — Inpatient Hospital Stay: Payer: Medicare Other | Attending: Internal Medicine | Admitting: Internal Medicine

## 2018-04-23 VITALS — BP 146/57 | HR 52 | Temp 98.7°F | Resp 19 | Ht 66.0 in | Wt 184.7 lb

## 2018-04-23 DIAGNOSIS — D696 Thrombocytopenia, unspecified: Secondary | ICD-10-CM | POA: Insufficient documentation

## 2018-04-23 DIAGNOSIS — I25119 Atherosclerotic heart disease of native coronary artery with unspecified angina pectoris: Secondary | ICD-10-CM | POA: Diagnosis not present

## 2018-04-23 DIAGNOSIS — I10 Essential (primary) hypertension: Secondary | ICD-10-CM | POA: Insufficient documentation

## 2018-04-23 LAB — CBC WITH DIFFERENTIAL (CANCER CENTER ONLY)
BASOS ABS: 0 10*3/uL (ref 0.0–0.1)
BASOS PCT: 0 %
Eosinophils Absolute: 0 10*3/uL (ref 0.0–0.5)
Eosinophils Relative: 0 %
HEMATOCRIT: 45.5 % (ref 38.4–49.9)
HEMOGLOBIN: 15.4 g/dL (ref 13.0–17.1)
Lymphocytes Relative: 11 %
Lymphs Abs: 1.1 10*3/uL (ref 0.9–3.3)
MCH: 31 pg (ref 27.2–33.4)
MCHC: 33.8 g/dL (ref 32.0–36.0)
MCV: 91.7 fL (ref 79.3–98.0)
Monocytes Absolute: 0.7 10*3/uL (ref 0.1–0.9)
Monocytes Relative: 6 %
NEUTROS ABS: 8.8 10*3/uL — AB (ref 1.5–6.5)
NEUTROS PCT: 83 %
Platelet Count: 129 10*3/uL — ABNORMAL LOW (ref 140–400)
RBC: 4.96 MIL/uL (ref 4.20–5.82)
RDW: 14.8 % — ABNORMAL HIGH (ref 11.0–14.6)
WBC: 10.6 10*3/uL — AB (ref 4.0–10.3)

## 2018-04-23 LAB — LACTATE DEHYDROGENASE: LDH: 130 U/L (ref 98–192)

## 2018-04-23 NOTE — Progress Notes (Signed)
[place] [phone]   [phone]  OFFICE PROGRESS NOTE  [name], MD [place] [address]  DIAGNOSIS: Thrombocytopenia, most likely idiopathic thrombocytopenic purpura.  PRIOR THERAPY:None.  CURRENT THERAPY: observation.  INTERVAL HISTORY: [name] 77 y.o. male returns to the clinic today for six-month follow-up visit.  The patient is feeling fine today with no concerning complaints except for few blisters in his arms and legs.  He was started on treatment with a course of taper prednisone and he is applying local bacitracin ointment to these areas.  He denied having any significant bleeding issues.  He denied having any nosebleed, gum bleed or rectal bleed.  He has no current chest pain, shortness of breath, cough or hemoptysis.  He denied having any fever or chills.  He has no nausea, vomiting, diarrhea or constipation.  He has repeat CBC, comprehensive metabolic panel and LDH performed recently and he is here for evaluation and discussion of his lab results.   MEDICAL HISTORY: Past Medical History:  Diagnosis Date  . Abnormal chest x-ray   . Allergic rhinitis   . Ankylosing spondylitis ([place])   . Anxiety   . Arthritis   . Colonic polyp   . Coronary atherosclerosis of native coronary artery   . Cough   . Dizziness 10/21/2016  . Ganglion cyst of wrist    left  . Heart attack ([place]) 2008  . Hemorrhoids   . History of malaria   . Hyperlipidemia    with low HDL  . Hypertension   . Kidney stone   . Other disorder of muscle, ligament, and fascia   . Rotator cuff tear   . Swelling of lower extremity 10/16/2015    ALLERGIES:  is allergic to tramadol.  MEDICATIONS:  Current Outpatient Medications  Medication Sig Dispense Refill  . albuterol (VENTOLIN HFA) 108 (90 Base) MCG/ACT inhaler Inhale 2 puffs into the lungs every 6 (six) hours as needed for wheezing or shortness of breath.    [name] amLODipine  (NORVASC) 5 MG tablet Take 5 mg by mouth daily.    [name] amLODipine (NORVASC) 5 MG tablet Take 5 mg by mouth daily.    [name] Apoaequorin (PREVAGEN PO) Take 1 tablet by mouth daily.     [name] aspirin 81 MG EC tablet Take 81 mg by mouth daily.     . Azelastine-Fluticasone (DYMISTA) 137-50 MCG/ACT SUSP Place into the nose.    . bacitracin 500 UNIT/GM ointment Apply 1 application topically 2 (two) times daily. 15 g 0  . budesonide-formoterol (SYMBICORT) 80-4.5 MCG/ACT inhaler Inhale 2 puffs into the lungs 2 (two) times daily. (Patient not taking: Reported on 04/17/2018) 1 Inhaler 0  . camphor-menthol (SARNA) lotion Apply 1 application topically as needed for itching. 222 mL 0  . famotidine (PEPCID) 20 MG tablet Take 20 mg by mouth at bedtime.    . Multiple Vitamins-Minerals (ICAPS AREDS 2) CAPS Take by mouth daily.    . pantoprazole (PROTONIX) 40 MG tablet Take 1 tablet (40 mg total) by mouth daily. Take 30-60 min before first meal of the day 30 tablet 2  . predniSONE (DELTASONE) 20 MG tablet Take 3 PO QAM x3days, 2 PO QAM x3days, 1 PO QAM x3days 18 tablet 0  . simvastatin (ZOCOR) 20 MG tablet TAKE 1 TABLET EVERY DAY WITH BREAKFAST 90 tablet 1   No current facility-administered medications for this visit.     SURGICAL HISTORY:  Past Surgical  History:  Procedure Laterality Date  . HEMORRHOID SURGERY  10/10   in [place]  . LEFT HEART CATH AND CORONARY ANGIOGRAPHY N/A 01/14/2017   Procedure: Left Heart Cath and Coronary Angiography;  Surgeon: [name], MD;  Location: [facility];  Service: Cardiovascular;  Laterality: N/A;  . LEFT HEART CATHETERIZATION WITH CORONARY ANGIOGRAM N/A 09/03/2012   Procedure: LEFT HEART CATHETERIZATION WITH CORONARY ANGIOGRAM;  Surgeon: [name], MD;  Location: [facility] CATH LAB;  Service: Cardiovascular;  Laterality: N/A;  . ROTATOR CUFF REPAIR     bilat by Dr. French Ana    REVIEW OF SYSTEMS:  A comprehensive review of systems was negative except for: Hematologic/lymphatic:  positive for petechiae   PHYSICAL EXAMINATION: General appearance: alert, cooperative and no distress Head: Normocephalic, without obvious abnormality, atraumatic Neck: no adenopathy Lymph nodes: Cervical, supraclavicular, and axillary nodes normal. Resp: clear to auscultation bilaterally Back: symmetric, no curvature. ROM normal. No CVA tenderness. Cardio: regular rate and rhythm, S1, S2 normal, no murmur, click, rub or gallop GI: soft, non-tender; bowel sounds normal; no masses,  no organomegaly Extremities: extremities normal, atraumatic, no cyanosis or edema  ECOG PERFORMANCE STATUS: 1 - Symptomatic but completely ambulatory  Blood pressure (!) 146/57, pulse (!) 52, temperature 98.7 F (37.1 C), temperature source Oral, resp. rate 19, height 5\' 6"  (1.676 m), weight 184 lb 11.2 oz (83.8 kg), SpO2 96 %.  LABORATORY DATA: Lab Results  Component Value Date   WBC 10.6 (H) 04/23/2018   HGB 15.4 04/23/2018   HCT 45.5 04/23/2018   MCV 91.7 04/23/2018   PLT 129 (L) 04/23/2018      Chemistry      Component Value Date/Time   NA 140 12/05/2017 0617   NA 141 04/21/2017 0823   K 3.6 12/05/2017 0617   K 4.0 04/21/2017 0823   CL 107 12/05/2017 0617   CL 106 09/30/2012 0949   CO2 23 12/05/2017 0617   CO2 25 04/21/2017 0823   BUN 21 (H) 12/05/2017 0617   BUN 20.2 04/21/2017 0823   CREATININE 0.95 12/05/2017 0617   CREATININE 0.9 04/21/2017 0823      Component Value Date/Time   CALCIUM 8.9 12/05/2017 0617   CALCIUM 9.6 04/21/2017 0823   ALKPHOS 61 10/20/2017 0815   ALKPHOS 61 04/21/2017 0823   AST 23 10/20/2017 0815   AST 22 04/21/2017 0823   ALT 33 10/20/2017 0815   ALT 36 04/21/2017 0823   BILITOT 0.8 10/20/2017 0815   BILITOT 0.99 04/21/2017 0823     BONE MARROW REPORT FINAL DIAGNOSIS RADIOGRAPHIC STUDIES: No results found.  ASSESSMENT AND PLAN:  This is a very pleasant 77 years old white male with idiopathic thrombocytopenic purpura who is currently on observation.    His platelets count are up to 129,000 today. The patient has no bleeding issues.  I recommended for him to continue on observation with repeat CBC, comprehensive metabolic panel and LDH in 6 months. He was advised to call immediately if he has any concerning symptoms in the interval. All questions were answered. The patient knows to call the clinic with any problems, questions or concerns. We can certainly see the patient much sooner if necessary.  Disclaimer: This note was dictated with voice recognition software. Similar sounding words can inadvertently be transcribed and may not be corrected upon review.

## 2018-04-23 NOTE — Telephone Encounter (Signed)
Printed avs and calender of upcoming appointment. Per 9/12 los 

## 2018-04-25 ENCOUNTER — Encounter: Payer: Self-pay | Admitting: Internal Medicine

## 2018-04-27 DIAGNOSIS — Z85828 Personal history of other malignant neoplasm of skin: Secondary | ICD-10-CM | POA: Diagnosis not present

## 2018-04-27 DIAGNOSIS — L570 Actinic keratosis: Secondary | ICD-10-CM | POA: Diagnosis not present

## 2018-04-27 DIAGNOSIS — D225 Melanocytic nevi of trunk: Secondary | ICD-10-CM | POA: Diagnosis not present

## 2018-05-08 DIAGNOSIS — H52223 Regular astigmatism, bilateral: Secondary | ICD-10-CM | POA: Diagnosis not present

## 2018-05-08 DIAGNOSIS — H524 Presbyopia: Secondary | ICD-10-CM | POA: Diagnosis not present

## 2018-05-08 DIAGNOSIS — H353131 Nonexudative age-related macular degeneration, bilateral, early dry stage: Secondary | ICD-10-CM | POA: Diagnosis not present

## 2018-05-08 DIAGNOSIS — H04123 Dry eye syndrome of bilateral lacrimal glands: Secondary | ICD-10-CM | POA: Diagnosis not present

## 2018-05-08 DIAGNOSIS — H2513 Age-related nuclear cataract, bilateral: Secondary | ICD-10-CM | POA: Diagnosis not present

## 2018-05-12 ENCOUNTER — Other Ambulatory Visit: Payer: Self-pay | Admitting: Physician Assistant

## 2018-05-19 ENCOUNTER — Ambulatory Visit (INDEPENDENT_AMBULATORY_CARE_PROVIDER_SITE_OTHER): Payer: Medicare Other | Admitting: Internal Medicine

## 2018-05-19 ENCOUNTER — Encounter: Payer: Self-pay | Admitting: Internal Medicine

## 2018-05-19 VITALS — BP 120/60 | HR 66 | Ht 65.25 in | Wt 182.0 lb

## 2018-05-19 DIAGNOSIS — Z23 Encounter for immunization: Secondary | ICD-10-CM | POA: Diagnosis not present

## 2018-05-19 DIAGNOSIS — J479 Bronchiectasis, uncomplicated: Secondary | ICD-10-CM

## 2018-05-19 DIAGNOSIS — J45991 Cough variant asthma: Secondary | ICD-10-CM | POA: Diagnosis not present

## 2018-05-19 DIAGNOSIS — I25119 Atherosclerotic heart disease of native coronary artery with unspecified angina pectoris: Secondary | ICD-10-CM | POA: Diagnosis not present

## 2018-05-19 MED ORDER — BUDESONIDE-FORMOTEROL FUMARATE 80-4.5 MCG/ACT IN AERO: INHALATION_SPRAY | RESPIRATORY_TRACT | 11 refills | Status: DC

## 2018-05-19 MED ORDER — PREDNISONE 10 MG PO TABS: ORAL_TABLET | ORAL | 0 refills | Status: DC

## 2018-05-19 NOTE — Assessment & Plan Note (Addendum)
See CT chest 11/26/16 = Scattered areas of cylindrical bronchiectasis - flutter added 12/12/2016  - prevnar 13 give 05/19/2018     Discussed pathophysiology using escalator analogy but see no need for abx at this point based on hx or exam   Instructed on proper use of flutter valve / max dosing with mucinex dm/ bed blocks to reduce possibility of noct gerd contributing to bronchiectasis / flu and pneuomonia vaccines updated

## 2018-05-19 NOTE — Assessment & Plan Note (Addendum)
07/22/2016    > try reduce symbicort to 80 2bid  - FENO 07/15/2016  =   42 on sym 160 2bid  - 07/30/2016  After extensive coaching HFA effectiveness =    90% > continue symb 80 2bid  - Sinus CT 07/31/2016 >>> neg - FENO 10/10/2016  =   12 during flare while on symb 80 2bid - PFT's  10/17/2016  FEV1 2.40 (89 % ) ratio 87  p 0 % improvement from saba p symb 80 x 2 prior to study with DLCO  60/58 % corrects to 90 % for alv volume and erv =50%  - Allergy profile 10/17/2016 >  Eos 0.0 /  IgE 55 Rast pos dust only   - 10/17/2016  Added singulair 10 mg  - HRCT chest 11/2016  -NSIP Changes/Bronchiectasis noted  - 12/12/2016 spirometry s obst with active symptoms >> trial off symbicort as not convinced helping - 01/13/17 trial of dymista > improved 05/19/2017 but did not continue it due to cost  - 11/10/17 d/c'd ppi and did not refill > 11/17/17 rec ok to leave off but restart prilosec for cough  - 02/07/18 recurrent severe cough on last day of [place] cruise > did not follow action plan  - FENO 02/18/2018  =   11 during flare p am prednisone - Spirometry 02/18/2018  No obst on no rx  Prior with active wheeze on exam - 03/03/18 d/c'd singulair  - 05/19/2018  After extensive coaching inhaler device,  effectiveness =    90% restart symb 80 1-2 q 12h    Flare of cough occurred off symb 80 and strongly doubt compliance with any of his meds based on conversation today.  I had an extended discussion with the patient reviewing all relevant studies completed to date and  lasting 25 minutes of a 261minute visit on the following ongoing concerns:   1) Unlike when you get a prescription for eyeglasses, it's not possible to always walk out of this or any medical office with a perfect prescription that is immediately effective  based on any test that we offer here.    On the contrary, it may take several weeks for the full impact of changes recommened today - hopefully you will respond well.  If not, then we'll adjust your medication on  your next visit accordingly, knowing more then than we can possibly know now.      2) restart symb 80 2bid and if not improving add pred x 6 days  3)  Each maintenance medication was reviewed in detail including most importantly the difference between maintenance and as needed and under what circumstances the prns are to be used. This was done in the context of a medication calendar review which provided the patient with a user-friendly unambiguous mechanism for medication administration and reconciliation and provides an action plan for all active problems. It is critical that this be shown to every doctor  for modification during the office visit if necessary so the patient can use it as a working document.       See device teaching which extended face to face time for this visit

## 2018-05-19 NOTE — Addendum Note (Signed)
Addended by: Rosana Berger on: 05/19/2018 09:15 AM   Modules accepted: Orders

## 2018-05-19 NOTE — Progress Notes (Signed)
Brief patient profile:  78   yowm never smoker with ankylosing spondilitis  with new onset variable  cough since Oct 2016 comes and goes s background of asthma but with background of nasal allergies for which took shots and moved residence in 1980s  >  No further problems except nasal drip /[place]neezing [place] then fine when stopped going there.    History of Present Illness  Congestion in chest x Oct 2016 worse in evenings with mostly dry coughing and much worse x sev weeks > [facility] eval on 07/15/16 cough for a week/ albterol and pred no better  rec Advair one puff twice per day until sample completed >> call if you feel you need a refill after this Zithromax 250 mg pill >> 2 pills on day 1, then 1 pill daily for next four days Albuterol 2 puffs every 4 to 6 hours as needed for cough, wheeze, or chest congestion Finish course of prednisone    07/22/2016 acute extended ov/Maziyah Vessel re:  Refractory cough > sob on advair and pred taper and getting worse Chief Complaint  Patient presents with  . Acute Visit    Increased SOB and cough are not improving since acute ov here 07/15/16. His cough has been occ prod with light colored sputum.  He states that his breathing does okay in the am's and then gets worse in the evenings.   on advair dpi/ benzoate and took pred 10 mg one tablet  This am and cough seems ok when he wakes up and progressively worse with Minimal actual sputum production as the day goes on and then does not really bother him while sleeping.. Shaking on details of care/ names of meds/ no better with saba  rec Take delsym two tsp every 12 hours and supplement if needed with  tramadol 50 mg up to 1 every 4 hours  .   Symbicort 80 Take 2 puffs first thing in am and then another 2 puffs about 12 hours later - if helping refill it Only use your albuterol (ventolin) inhaler  As long as coughing at all:  Try prilosec otc 20mg   Take 30-60 min before first meal of the day  and Pepcid ac (famotidine) 20 mg one @  bedtime until cough is completely gone for at least a week without the need for cough suppression GERD diet    07/30/2016 acute extended ov/Danicka Hourihan re: refractory cough since late Nov 2017  Chief Complaint  Patient presents with  . Acute Visit    Pt c/o increased cough and congestion for the past 2 wks. He states symptoms get worse in the evening. Cough is prod in the am's with thick, clear sputum.  He states he can not lie down flat due to cough and cough sometimes waked him up. He also c/o wheezing.   brought all his meds, not using symb 2bid per count, only missing 11 tramadol "it's for pain, right? "  Some better while on pred, worse since finished it/ only taking prilosec otc 20 mg qam / is taking pepcid 20 mg hs  Not using any saba "it's for emergencies, right?"  rec Prednisone 10 mg take  4 each am x 2 days,   2 each am x 2 days,  1 each am x 2 days and stop  Increase the prilosec ac to where you take x 2 30-60 mn before breakfast  And continue the pepcid ac 20mg  in evening  Continue symbicort 80 (  same as dulera 100 sample) Take 2 puffs first thing in am and then another 2 puffs about 12 hours later.  Only use your albuterol as a rescue medication  For cough use mucinex dm 1200 mg every 12 hours and supplement with the tramadol up to 2 every 4 hours until no cough x 3 days  Please see patient coordinator before you leave today  to schedule sinus CT    08/21/16  Restart Symbicort 2 puffs Twice daily  .  Restart Prilosec 20mg  daily before meal  Restart Pepcid 20mg  At bedtime   May use Zyrtec 10mg  At bedtime  As needed  Drainage  Zpack take as directed.  Mucinex DM Twice daily  As needed  Cough/congestion .     10/10/2016 acute extended ov/Khush Pasion re:  uacs vs asthma  maint on symb 80 2bid / gerd rx with otcs only  Chief Complaint  Patient presents with  . Acute Visit    Pt c/o cough and increased SOB x 1 wk, and progressively worse over the past  3-4 days. His cough is prod with thick, clear sputum. He states can not lie down flat without cough.   sensation of something stuck never gone away since = oct 2016 then acutely worse sob/ cough x 1 week prior to OV  And not better p saba Does not have mucinex dm / no zyrtec  rec First take delsym two tsp every 12 hours and supplement if needed with  tramadol 50 mg up to 2 every 4 hours to suppress the urge to cough at all or even clear your throat. Swallowing water or using ice chips/non mint and menthol containing candies (such as lifesavers or sugarless jolly ranchers) are also effective.  You should rest your voice and avoid activities that you know make you cough. Once you have eliminated the cough for 3 straight days try reducing the tramadol first,  then the delsym as tolerated.   Prednisone 10 mg take  4 each am x 2 days,   2 each am x 2 days,  1 each am x 2 days and stop (this is to eliminate allergies and inflammation from coughing) Protonix (pantoprazole) Take 30-60 min before first meal of the day and Pepcid 20 mg one bedtime plus chlorpheniramine 4 mg x 2 at bedtime (both available over the counter)  until cough is completely gone for at least a week without the need for cough suppression For drainage / throat tickle try take CHLORPHENIRAMINE  4 mg - take one every 4 hours as needed - available over the counter- may cause drowsiness so start with just a bedtime dose or two and see how you tolerate it before trying in daytime   GERD diet  Stop prilosec/ mucinex  Please remember to go to the lab  department downstairs in the basement  for your tests - we will call you with the results when they are available.  Please schedule a follow up office visit in 2 weeks, sooner if needed  with all medications /inhalers/ solutions in hand so we can verify exactly what you are taking. This includes all medications from all doctors and over the counters Add did not got to lab as rec    10/17/2016  f/u  ov/Havier Deeb re: intermittent cough x oct 2016 def gets better on prednisone / sense of chest congestion but no excess mucus Chief Complaint  Patient presents with  . Follow-up    PFT's done today.  symptoms of  daily cough  worse p supper Did not bring meds as requested / not clear he even read them when reading them back line by line  Not using any albuterol "doesn't help"  Not able to lie down due to cough x month x no better even while on prednisone though "80% better" on pred vs off it/ no longer using cpap rec Add singulair each evening 10 daily trial basis  Please remember to go to the lab  department downstairs in the basement  for your tests - we will call you with the results when they are available. See Tammy NP in 4  weeks with all your medications, same page with your medication use she will arrange follow up with me or with allergy depending on your tests  Add : consider adding flutter if doesn't have one and HRCT looking for bronchiectasis      11/15/16 NP eval May stop  Tramadol  Use Delsym 2 tsp every 12hr for coughing .  Use Mucinex Twice daily  For congestion and mucus.  Call back with formulary options for Symbicort.  Follow med calendar closely and bring to each visit     12/12/2016  f/u ov/Corrina Steffensen re:  Chronic cough/ bronchiectasis / not convinced symb 80 helping  Chief Complaint  Patient presents with  . Follow-up    Breathing is "fine". He still c/o chest congestion. He is not coughing much but when he does it is prod with cream colored sputum.    rec Stop mucinex / delsym and symbicort and just use ventolin as needed  Work on inhaler technique:  relax and gently blow all the way out then take a nice smooth deep breath back in, triggering the inhaler at same time you start breathing in.  Hold for up to 5 seconds if you can. Blow out thru nose. Rinse and gargle with water when done Add mucinex dm 1200 mg twice daily and flutter valve as much as you can  Flonase twice  daily each nostril See calendar for specific medication instruction     02/13/2018  f/u ov/Modupe Shampine re:  Recurrent severe cough while on last day of cruise to [place] from [place] 6/23-6/30/19  Chief Complaint  Patient presents with  . Acute Visit    Pt c/o cough with clear sputum, wheezing and increased SOB x 3 days. He states his chest hurts when he coughs.   onset cough/ wheeze abrupt on 02/07/18 > mucus is clear 24/7  / sob just with activity  Not using any ventolin yet nor did he activate any of the action plans on his medcalendar which he initially denied receiving but found on his iphone p searching. Sleeping in recliner  Assoc with gen chest tightness/ chest discomfort with cough and  overt HB on just pepcid 20 mg hs  rec zpak Prednisone 10 mg take  4 each am x 2 days,   2 each am x 2 days,  1 each am x 2 days and stop Follow med cal    02/18/2018  f/u ov/Noemy Hallmon re: refractory cough since 02/07/18  Chief Complaint  Patient presents with  . Acute Visit    increased SOB, sweats, chest congestion, cough- non prod.   Dyspnea:  MMRC1 = can walk nl pace, flat grade, can't hurry or go uphills or steps s sob   Cough: dry worse p supper then continues thru the noct Sleeping: difficult due to cough and sits up and used saba 3 am on day of  ov  - off cpap for 6 months SABA use: once a day rec Symbicort 80 Take 2 puffs first thing in am and then another 2 puffs about 12 hours later. Work on inhaler technique:  Resume protonix 40 mg Take 30-60 min before first meal of the day  When coughing  1) add prilosec 20mg  30 min before your last meal 2) Take mucinex dm 1200 mg every 12 hours and cough into flutter valve and supplement if needed with  Demerol 50 mg up to 1 every 4 hours  Once you have eliminated the cough for 3 straight days try reducing the demerol  first,  then the mucinex dm as tolerated.   Try to resume the cpap if you can tolerate it - the demerol will help  If not doing better go  ahead and use the prednisone dose pack  See Tammy NP w/in 2 weeks or first available with all your medications    05/19/2018  f/u ov/Keeshawn Fakhouri re: bronchiectasis/ chronic cough/ never took demerol, only took a few prednisone > 95% resolution at med cal ov 03/03/18 so d/c'd singulair and not taking symb 80 as maint (brought med calendar/ not following as written) Chief Complaint  Patient presents with  . Follow-up    increased chest congestion and cough x 3-4 wks. He is coughing up some cream colored sputum.   Dyspnea:  Still able to jog / steps ok   Cough: was better for months but never completely gone then worse x 4 weeks esp  In am Sleeping: 2- 3 pillows flat bed - no bed blocks SABA use: not using at  02: none     No obvious other day to day or daytime variability or assoc excess/ purulent sputum or mucus plugs or hemoptysis or cp or chest tightness, subjective wheeze or overt sinus or hb symptoms.    Also denies any obvious fluctuation of symptoms with weather or environmental changes or other aggravating or alleviating factors except as outlined above   No unusual exposure hx or h/o childhood pna/ asthma or knowledge of premature birth.  Current Allergies, Complete Past Medical History, Past Surgical History, Family History, and Social History were reviewed in [employer] [employer] record.  ROS  The following are not active complaints unless bolded Hoarseness, sore throat, dysphagia, dental problems, itching, sneezing,  nasal congestion or discharge of excess mucus or purulent secretions, ear ache,   fever, chills, sweats, unintended wt loss or wt gain, classically pleuritic or exertional cp,  orthopnea pnd or arm/hand swelling  or leg swelling, presyncope, palpitations, abdominal pain, anorexia, nausea, vomiting, diarrhea  or change in bowel habits or change in bladder habits, change in stools or change in urine, dysuria, hematuria,  rash, arthralgias, visual complaints,  headache, numbness, weakness or ataxia or problems with walking or coordination,  change in mood or  memory.        Current Meds  Medication Sig  . albuterol (VENTOLIN HFA) 108 (90 Base) MCG/ACT inhaler Inhale 2 puffs into the lungs every 6 (six) hours as needed for wheezing or shortness of breath.  [name] amLODipine (NORVASC) 5 MG tablet TAKE 1 TABLET EVERY DAY  . Apoaequorin (PREVAGEN PO) Take 1 tablet by mouth daily.   [name] aspirin 81 MG EC tablet Take 81 mg by mouth daily.   . budesonide-formoterol (SYMBICORT) 80-4.5 MCG/ACT inhaler Take 2 puffs first thing in am and then another 2 puffs about 12 hours later.  [name] dextromethorphan-guaiFENesin ([place] DM) 30-600  MG 12hr tablet Take 2 tablets by mouth 2 (two) times daily as needed for cough.  . famotidine (PEPCID) 20 MG tablet Take 20 mg by mouth at bedtime.  . Multiple Vitamins-Minerals (ICAPS AREDS 2) CAPS Take by mouth daily.  . pantoprazole (PROTONIX) 40 MG tablet Take 1 tablet (40 mg total) by mouth daily. Take 30-60 min before first meal of the day  . Respiratory Therapy Supplies (FLUTTER) DEVI by Does not apply route as directed.  . simvastatin (ZOCOR) 20 MG tablet TAKE 1 TABLET EVERY DAY WITH BREAKFAST  . [  budesonide-formoterol (SYMBICORT) 80-4.5 MCG/ACT inhaler Not taking as rec               Pulmonary tests CT angio chest 11/30/15 >> motion artifact, mosaic pattern, calcified granuloma SNIFF test 11/30/15 >> elevated Rt diaphragm, but has b/l diaphragm movement  Cardiac tests 10/16/15 Doppler legs b/l >> no DVT 11/30/15 Echo >> EF 65 to 76%, grade 1 diastolic CHF  Sleep tests HST 12/21/15 >> AHI 44.5, SpO2 low 64% Auto CPAP 03/02/16 to 03/31/16 >> used on 26 of 30 nights with average 6 hrs 32 min.  Average AHI 5.5 with median CPAP 8 and 95 th percentile CPAP 11 cm H2O ONO with CPAP 04/10/16 >> test time 6 hrs 30 min.  Mean SpO2 93.51%, low SpO2 77%.  Spent 7 min with SpO2 < 88%   Past medical history Ankylosing spondylitis,  Allergies, CAD, HLD, HTN, Nephrolithiasis, Malaria      Physical Exam    02/18/2018       186  02/13/2018         190  11/17/2017         186  05/20/2017       190  01/15/2017         183  12/12/2016         186  10/17/2016         183  10/10/2016         187  07/30/2016     191  07/22/16 194 lb (88 kg)  07/15/16 196 lb (88.9 kg)  07/12/16 192 lb 9.6 oz (87.4 kg)      amb somber wm nasal tone to voice// harsh upper airway sounding cough   Vital signs reviewed - Note on arrival 02 sats  96% on RA   HEENT: nl turbinates bilaterally, and oropharynx. Nl external ear canals without cough reflex/ mild  bilateral non-specific turbinate edema s purulent secretions   NECK :  without JVD/Nodes/TM/ nl carotid upstrokes bilaterally   LUNGS: no acc muscle use,  Nl contour chest which is clear to A and P bilaterally without cough on insp or exp maneuvers   CV:  RRR  no s3 or murmur or increase in P2, and no edema   ABD:  soft and nontender with nl inspiratory excursion in the supine position. No bruits or organomegaly appreciated, bowel sounds nl  MS:  Nl gait/ ext warm without deformities, calf tenderness, cyanosis or clubbing No obvious joint restrictions   SKIN: warm and dry without lesions    NEURO:  alert, approp, nl sensorium with  no motor or cerebellar deficits apparent.         Assessment/Plan

## 2018-05-19 NOTE — Patient Instructions (Addendum)
Bronchiectasis =   you have scarring of your bronchial tubes which means that they don't function perfectly normally and mucus tends to pool in certain areas of your lung which can cause pneumonia and further scarring of your lung and bronchial tubes  Whenever you develop cough congestion take  mucinex dm up to 1200 mg every 12 hours  > these will help keep the mucus loose and flowing but if your condition worsens you need to seek help immediately preferably here or somewhere inside the [facility] system to compare xrays ( worse = darker or bloody mucus or pain on breathing in)    Symbicort is supposed to be 1-2 puffs 1st thing in am and repeat  every 12 hours if you think you need it or not  If cough getting worse >  Prednisone 10 mg take  4 each am x 2 days,   2 each am x 2 days,  1 each am x 2 days and stop   Consider placing 6 in bed blocks under the head of your bed (available at most mattress stores   Please schedule a follow up office visit in 4 weeks, sooner if needed with med calendar in hand which you should follow as written  - needs prevnar 13 if not given

## 2018-06-12 ENCOUNTER — Telehealth: Payer: Self-pay | Admitting: Internal Medicine

## 2018-06-12 MED ORDER — PANTOPRAZOLE SODIUM 40 MG PO TBEC: 40.0000 mg | DELAYED_RELEASE_TABLET | Freq: Every day | ORAL | 2 refills | Status: DC

## 2018-06-12 NOTE — Telephone Encounter (Signed)
Called and spoke with Patient.  Patient is requesting a refill for Protonix to be sent in to [facility] on [address].  Precription sent in to preferred pharmacy.  Nothing further at this time.

## 2018-06-19 ENCOUNTER — Ambulatory Visit (INDEPENDENT_AMBULATORY_CARE_PROVIDER_SITE_OTHER): Payer: Medicare Other | Admitting: Internal Medicine

## 2018-06-19 ENCOUNTER — Encounter: Payer: Self-pay | Admitting: Internal Medicine

## 2018-06-19 DIAGNOSIS — J479 Bronchiectasis, uncomplicated: Secondary | ICD-10-CM

## 2018-06-19 DIAGNOSIS — I25119 Atherosclerotic heart disease of native coronary artery with unspecified angina pectoris: Secondary | ICD-10-CM | POA: Diagnosis not present

## 2018-06-19 DIAGNOSIS — J45991 Cough variant asthma: Secondary | ICD-10-CM

## 2018-06-19 MED ORDER — PREDNISONE 10 MG PO TABS: ORAL_TABLET | ORAL | 11 refills | Status: DC

## 2018-06-19 NOTE — Patient Instructions (Addendum)
Symbicort 80 should be 2  Every 12 hours unless you are doing great  See calendar for specific medication instructions and bring it back for each and every office visit for every healthcare provider you see.  Without it,  you may not receive the best quality medical care that we feel you deserve.  You will note that the calendar groups together  your maintenance  medications that are timed at particular times of the day.  Think of this as your checklist for what your doctor has instructed you to do until your next evaluation to see what benefit  there is  to staying on a consistent group of medications intended to keep you well.  The other group at the bottom is entirely up to you to use as you see fit  for specific symptoms that may arise between visits that require you to treat them on an as needed basis.  Think of this as your action plan or "what if" list.   Separating the top medications from the bottom group is fundamental to providing you adequate care going forward.     Please schedule a follow up visit in 3 months but call sooner if needed

## 2018-06-19 NOTE — Progress Notes (Signed)
Brief patient profile:  65  yowm never smoker with ankylosing spondilitis  with new onset variable  cough since Oct 2016 comes and goes s background of asthma but with background of nasal allergies for which took shots and moved residence in 1980s  >  No further problems except nasal drip /sneezing [place] then fine when stopped going there.    History of Present Illness  Congestion in chest x Oct 2016 worse in evenings with mostly dry coughing and much worse x sev weeks > [facility]ood eval on 07/15/16 cough for a week/ albterol and pred no better  rec Advair one puff twice per day until sample completed >> call if you feel you need a refill after this Zithromax 250 mg pill >> 2 pills on day 1, then 1 pill daily for next four days Albuterol 2 puffs every 4 to 6 hours as needed for cough, wheeze, or chest congestion Finish course of prednisone    07/22/2016 acute extended ov/[name] re:  Refractory cough > sob on advair and pred taper and getting worse Chief Complaint  Patient presents with  . Acute Visit    Increased SOB and cough are not improving since acute ov here 07/15/16. His cough has been occ prod with light colored sputum.  He states that his breathing does okay in the am's and then gets worse in the evenings.   on advair dpi/ benzoate and took pred 10 mg one tablet  This am and cough seems ok when he wakes up and progressively worse with Minimal actual sputum production as the day goes on and then does not really bother him while sleeping.. Shaking on details of care/ names of meds/ no better with saba  rec Take delsym two tsp every 12 hours and supplement if needed with  tramadol 50 mg up to 1 every 4 hours  .   Symbicort 80 Take 2 puffs first thing in am and then another 2 puffs about 12 hours later - if helping refill it Only use your albuterol (ventolin) inhaler  As long as coughing at all:  Try prilosec otc 20mg   Take 30-60 min before first meal of the day  and Pepcid ac (famotidine) 20 mg one @  bedtime until cough is completely gone for at least a week without the need for cough suppression GERD diet    07/30/2016 acute extended ov/[name] re: refractory cough since late Nov 2017  Chief Complaint  Patient presents with  . Acute Visit    Pt c/o increased cough and congestion for the past 2 wks. He states symptoms get worse in the evening. Cough is prod in the am's with thick, clear sputum.  He states he can not lie down flat due to cough and cough sometimes waked him up. He also c/o wheezing.   brought all his meds, not using symb 2bid per count, only missing 11 tramadol "it's for pain, right? "  Some better while on pred, worse since finished it/ only taking prilosec otc 20 mg qam / is taking pepcid 20 mg hs  Not using any saba "it's for emergencies, right?"  rec Prednisone 10 mg take  4 each am x 2 days,   2 each am x 2 days,  1 each am x 2 days and stop  Increase the prilosec ac to where you take x 2 30-60 mn before breakfast  And continue the pepcid ac 20mg  in evening  Continue symbicort 80 (same  as dulera 100 sample) Take 2 puffs first thing in am and then another 2 puffs about 12 hours later.  Only use your albuterol as a rescue medication  For cough use mucinex dm 1200 mg every 12 hours and supplement with the tramadol up to 2 every 4 hours until no cough x 3 days  Please see patient coordinator before you leave today  to schedule sinus CT    08/21/16  Restart Symbicort 2 puffs Twice daily  .  Restart Prilosec 20mg  daily before meal  Restart Pepcid 20mg  At bedtime   May use Zyrtec 10mg  At bedtime  As needed  Drainage  Zpack take as directed.  Mucinex DM Twice daily  As needed  Cough/congestion .     10/10/2016 acute extended ov/[name] re:  uacs vs asthma  maint on symb 80 2bid / gerd rx with otcs only  Chief Complaint  Patient presents with  . Acute Visit    Pt c/o cough and increased SOB x 1 wk, and progressively worse over the past  3-4 days. His cough is prod with thick, clear sputum. He states can not lie down flat without cough.   sensation of something stuck never gone away since = oct 2016 then acutely worse sob/ cough x 1 week prior to OV  And not better p saba Does not have mucinex dm / no zyrtec  rec First take delsym two tsp every 12 hours and supplement if needed with  tramadol 50 mg up to 2 every 4 hours to suppress the urge to cough at all or even clear your throat. Swallowing water or using ice chips/non mint and menthol containing candies (such as lifesavers or sugarless jolly ranchers) are also effective.  You should rest your voice and avoid activities that you know make you cough. Once you have eliminated the cough for 3 straight days try reducing the tramadol first,  then the delsym as tolerated.   Prednisone 10 mg take  4 each am x 2 days,   2 each am x 2 days,  1 each am x 2 days and stop (this is to eliminate allergies and inflammation from coughing) Protonix (pantoprazole) Take 30-60 min before first meal of the day and Pepcid 20 mg one bedtime plus chlorpheniramine 4 mg x 2 at bedtime (both available over the counter)  until cough is completely gone for at least a week without the need for cough suppression For drainage / throat tickle try take CHLORPHENIRAMINE  4 mg - take one every 4 hours as needed - available over the counter- may cause drowsiness so start with just a bedtime dose or two and see how you tolerate it before trying in daytime   GERD diet  Stop prilosec/ mucinex  Please remember to go to the lab  department downstairs in the basement  for your tests - we will call you with the results when they are available.  Please schedule a follow up office visit in 2 weeks, sooner if needed  with all medications /inhalers/ solutions in hand so we can verify exactly what you are taking. This includes all medications from all doctors and over the counters Add did not got to lab as rec    10/17/2016  f/u  ov/[name] re: intermittent cough x oct 2016 def gets better on prednisone / sense of chest congestion but no excess mucus Chief Complaint  Patient presents with  . Follow-up    PFT's done today.  symptoms of daily  cough  worse p supper Did not bring meds as requested / not clear he even read them when reading them back line by line  Not using any albuterol "doesn't help"  Not able to lie down due to cough x month x no better even while on prednisone though "80% better" on pred vs off it/ no longer using cpap rec Add singulair each evening 10 daily trial basis  Please remember to go to the lab  department downstairs in the basement  for your tests - we will call you with the results when they are available. See Tammy NP in 4  weeks with all your medications, same page with your medication use she will arrange follow up with me or with allergy depending on your tests  Add : consider adding flutter if doesn't have one and HRCT looking for bronchiectasis      11/15/16 NP eval May stop  Tramadol  Use Delsym 2 tsp every 12hr for coughing .  Use Mucinex Twice daily  For congestion and mucus.  Call back with formulary options for Symbicort.  Follow med calendar closely and bring to each visit     12/12/2016  f/u ov/[name] re:  Chronic cough/ bronchiectasis / not convinced symb 80 helping  Chief Complaint  Patient presents with  . Follow-up    Breathing is "fine". He still c/o chest congestion. He is not coughing much but when he does it is prod with cream colored sputum.    rec Stop mucinex / delsym and symbicort and just use ventolin as needed  Work on inhaler technique:  relax and gently blow all the way out then take a nice smooth deep breath back in, triggering the inhaler at same time you start breathing in.  Hold for up to 5 seconds if you can. Blow out thru nose. Rinse and gargle with water when done Add mucinex dm 1200 mg twice daily and flutter valve as much as you can  Flonase twice  daily each nostril See calendar for specific medication instruction     02/13/2018  f/u ov/[name] re:  Recurrent severe cough while on last day of cruise to [place] from [place] 6/23-6/30/19  Chief Complaint  Patient presents with  . Acute Visit    Pt c/o cough with clear sputum, wheezing and increased SOB x 3 days. He states his chest hurts when he coughs.   onset cough/ wheeze abrupt on 02/07/18 > mucus is clear 24/7  / sob just with activity  Not using any ventolin yet nor did he activate any of the action plans on his medcalendar which he initially denied receiving but found on his iphone p searching. Sleeping in recliner  Assoc with gen chest tightness/ chest discomfort with cough and  overt HB on just pepcid 20 mg hs  rec zpak Prednisone 10 mg take  4 each am x 2 days,   2 each am x 2 days,  1 each am x 2 days and stop Follow med cal    02/18/2018  f/u ov/[name] re: refractory cough since 02/07/18  Chief Complaint  Patient presents with  . Acute Visit    increased SOB, sweats, chest congestion, cough- non prod.   Dyspnea:  MMRC1 = can walk nl pace, flat grade, can't hurry or go uphills or steps s sob   Cough: dry worse p supper then continues thru the noct Sleeping: difficult due to cough and sits up and used saba 3 am on day of ov  -  off cpap for 6 months SABA use: once a day rec Symbicort 80 Take 2 puffs first thing in am and then another 2 puffs about 12 hours later. Work on inhaler technique:  Resume protonix 40 mg Take 30-60 min before first meal of the day  When coughing  1) add prilosec 20mg  30 min before your last meal 2) Take mucinex dm 1200 mg every 12 hours and cough into flutter valve and supplement if needed with  Demerol 50 mg up to 1 every 4 hours  Once you have eliminated the cough for 3 straight days try reducing the demerol  first,  then the mucinex dm as tolerated.   Try to resume the cpap if you can tolerate it - the demerol will help  If not doing better go  ahead and use the prednisone dose pack  See Tammy NP w/in 2 weeks or first available with all your medications    05/19/2018  f/u ov/[name] re: bronchiectasis/ chronic cough/ never took demerol, only took a few prednisone > 95% resolution at med cal ov 03/03/18 so d/c'd singulair and not taking symb 80 as maint (brought med calendar/ not following as written) Chief Complaint  Patient presents with  . Follow-up    increased chest congestion and cough x 3-4 wks. He is coughing up some cream colored sputum.   Dyspnea:  Still able to jog / steps ok   Cough: was better for months but never completely gone then worse x 4 weeks esp  In am Sleeping: 2- 3 pillows flat bed - no bed blocks SABA use: not using at  02: none  rec rec Bronchiectasis =   you have scarring of your bronchial tubes  Whenever you develop cough congestion take  mucinex dm up to 1200 mg every 12 hours  >  Symbicort is supposed to be 1-2 puffs 1st thing in am and repeat  every 12 hours if you think you need it or not If cough getting worse >  Prednisone 10 mg take  4 each am x 2 days,   2 each am x 2 days,  1 each am x 2 days and stop  Consider placing 6 in bed blocks under the head of your bed (available at most mattress stores)       06/19/2018  f/u ov/[name] re: bronchiectasis/ did not follow any of the action plans on med calendar he brought with him , still on symb 80 just 1 bid despite flare of cough/ not using mucienx dm or flutter approp Chief Complaint  Patient presents with  . Follow-up    non prod cough x 1 wk.  He has not had to use his prn pred or albuterol.   Dyspnea:  Still able to jog and go up to steps Cough: non prod cough  Worse when wake up but non productive  Sleeping: 3 pillows no bed blocks SABA use: no   No obvious day to day or daytime variability or assoc excess/ purulent sputum or mucus plugs or hemoptysis or cp or chest tightness, subjective wheeze or overt sinus or hb symptoms.   Sleeping as  above  without nocturnal  or early am exacerbation  of respiratory  c/o's or need for noct saba. Also denies any obvious fluctuation of symptoms with weather or environmental changes or other aggravating or alleviating factors except as outlined above   No unusual exposure hx or h/o childhood pna/ asthma or knowledge of premature birth.  Current Allergies,  Complete Past Medical History, Past Surgical History, Family History, and Social History were reviewed in [employer] record.  ROS  The following are not active complaints unless bolded Hoarseness, sore throat, dysphagia, dental problems, itching, sneezing,  nasal congestion or discharge of excess mucus or purulent secretions, ear ache,   fever, chills, sweats, unintended wt loss or wt gain, classically pleuritic or exertional cp,  orthopnea pnd or arm/hand swelling  or leg swelling, presyncope, palpitations, abdominal pain, anorexia, nausea, vomiting, diarrhea  or change in bowel habits or change in bladder habits, change in stools or change in urine, dysuria, hematuria,  rash, arthralgias, visual complaints, headache, numbness, weakness or ataxia or problems with walking or coordination,  change in mood or  memory.        Current Meds  Medication Sig  . albuterol (VENTOLIN HFA) 108 (90 Base) MCG/ACT inhaler Inhale 2 puffs into the lungs every 6 (six) hours as needed for wheezing or shortness of breath.  [name] amLODipine (NORVASC) 5 MG tablet TAKE 1 TABLET EVERY DAY  . Apoaequorin (PREVAGEN PO) Take 1 tablet by mouth daily.   [name] aspirin 81 MG EC tablet Take 81 mg by mouth daily.   . Azelastine-Fluticasone (DYMISTA) 137-50 MCG/ACT SUSP Place into the nose.  . budesonide-formoterol (SYMBICORT) 80-4.5 MCG/ACT inhaler Take 2 puffs first thing in am and then another 2 puffs about 12 hours later.  . chlorpheniramine (CHLOR-TRIMETON) 4 MG tablet Take 4 mg by mouth 2 (two) times daily as needed for allergies.  [name]  dextromethorphan-guaiFENesin (MUCINEX DM) 30-600 MG 12hr tablet Take 2 tablets by mouth 2 (two) times daily as needed for cough.  . famotidine (PEPCID) 20 MG tablet Take 20 mg by mouth at bedtime.  . Multiple Vitamins-Minerals (ICAPS AREDS 2) CAPS Take by mouth daily.  . pantoprazole (PROTONIX) 40 MG tablet Take 1 tablet (40 mg total) by mouth daily. Take 30-60 min before first meal of the day  . Respiratory Therapy Supplies (FLUTTER) DEVI by Does not apply route as directed.  . simvastatin (ZOCOR) 20 MG tablet TAKE 1 TABLET EVERY DAY WITH BREAKFAST  .   Med Name: CPAP per Dr Halford Chessman                    Pulmonary tests CT angio chest 11/30/15 >> motion artifact, mosaic pattern, calcified granuloma SNIFF test 11/30/15 >> elevated Rt diaphragm, but has b/l diaphragm movement  Cardiac tests 10/16/15 Doppler legs b/l >> no DVT 11/30/15 Echo >> EF 65 to 41%, grade 1 diastolic CHF  Sleep tests HST 12/21/15 >> AHI 44.5, SpO2 low 64% Auto CPAP 03/02/16 to 03/31/16 >> used on 26 of 30 nights with average 6 hrs 32 min.  Average AHI 5.5 with median CPAP 8 and 95 th percentile CPAP 11 cm H2O ONO with CPAP 04/10/16 >> test time 6 hrs 30 min.  Mean SpO2 93.51%, low SpO2 77%.  Spent 7 min with SpO2 < 88%   Past medical history Ankylosing spondylitis, Allergies, CAD, HLD, HTN, Nephrolithiasis, Malaria      Physical Exam    02/18/2018       186  02/13/2018         190  11/17/2017         186  05/20/2017       190  01/15/2017         183  12/12/2016         186  10/17/2016  183  10/10/2016         187  07/30/2016     191  07/22/16 194 lb (88 kg)  07/15/16 196 lb (88.9 kg)  07/12/16 192 lb 9.6 oz (87.4 kg)     amb somber wm/ harsth upper airway cough   Vital signs reviewed - Note on arrival 02 sats  96% on RA      HEENT: nl dentition, turbinates bilaterally, and oropharynx. Nl external ear canals without cough reflex   NECK :  without JVD/Nodes/TM/ nl carotid upstrokes bilaterally   LUNGS:  no acc muscle use,  Nl contour chest min crackles on insp  Bilaterally R>L base  without cough on insp or exp maneuvers   CV:  RRR  no s3 or murmur or increase in P2, and no edema   ABD:  soft and nontender with nl inspiratory excursion in the supine position. No bruits or organomegaly appreciated, bowel sounds nl  MS:  Nl gait/ ext warm without deformities, calf tenderness, cyanosis or clubbing No obvious joint restrictions   SKIN: warm and dry without lesions    NEURO:  alert, approp, nl sensorium with  no motor or cerebellar deficits apparent.         Assessment/Plan

## 2018-06-21 ENCOUNTER — Encounter: Payer: Self-pay | Admitting: Internal Medicine

## 2018-06-21 NOTE — Assessment & Plan Note (Signed)
See CT chest 11/26/16 = Scattered areas of cylindrical bronchiectasis - flutter added 12/12/2016  - prevnar 13 give 05/19/2018    No evidence of bronchiectasis flare on need for abx at this point    >>> f/u q 3 m, sooner if needed

## 2018-06-21 NOTE — Assessment & Plan Note (Signed)
07/22/2016    > try reduce symbicort to 80 2bid  - FENO 07/15/2016  =   42 on sym 160 2bid  - 07/30/2016  After extensive coaching HFA effectiveness =    90% > continue symb 80 2bid  - Sinus CT 07/31/2016 >>> neg - FENO 10/10/2016  =   12 during flare while on symb 80 2bid - PFT's  10/17/2016  FEV1 2.40 (89 % ) ratio 87  p 0 % improvement from saba p symb 80 x 2 prior to study with DLCO  60/58 % corrects to 90 % for alv volume and erv =50%  - Allergy profile 10/17/2016 >  Eos 0.0 /  IgE 55 Rast pos dust only   - 10/17/2016  Added singulair 10 mg  - HRCT chest 11/2016  -NSIP Changes/Bronchiectasis noted  - 12/12/2016 spirometry s obst with active symptoms >> trial off symbicort as not convinced helping - 01/13/17 trial of dymista > improved 05/19/2017 but did not continue it due to cost  - 11/10/17 d/c'd ppi and did not refill > 11/17/17 rec ok to leave off but restart prilosec for cough  - 02/07/18 recurrent severe cough on last day of [place] cruise > did not follow action plan  - FENO 02/18/2018  =   11 during flare p am prednisone - Spirometry 02/18/2018  No obst on no rx  Prior with active wheeze on exam - 03/03/18 d/c'd singulair  - 05/19/2018  After extensive coaching inhaler device,  effectiveness =    90% restart symb 80 1-2 q 12h    Has not followed any of his action plans so far with preset flare     I had an extended discussion with the patient reviewing all relevant studies completed to date and  lasting 15 to 20 minutes of a 25 minute visit    Each maintenance medication was reviewed in detail including most importantly the difference between maintenance and prns and under what circumstances the prns are to be triggered using an action plan format that is not reflected in the computer generated alphabetically organized AVS but trather by a customized med calendar that reflects the AVS meds with confirmed 100% correlation.   In addition, Please see AVS for unique instructions that I personally wrote and  verbalized to the the pt in detail and then reviewed with pt  by my nurse highlighting any  changes in therapy recommended at today's visit to their plan of care.

## 2018-07-28 DIAGNOSIS — L570 Actinic keratosis: Secondary | ICD-10-CM | POA: Diagnosis not present

## 2018-07-28 DIAGNOSIS — L720 Epidermal cyst: Secondary | ICD-10-CM | POA: Diagnosis not present

## 2018-07-28 DIAGNOSIS — L820 Inflamed seborrheic keratosis: Secondary | ICD-10-CM | POA: Diagnosis not present

## 2018-07-28 DIAGNOSIS — Z85828 Personal history of other malignant neoplasm of skin: Secondary | ICD-10-CM | POA: Diagnosis not present

## 2018-07-31 ENCOUNTER — Other Ambulatory Visit: Payer: Self-pay

## 2018-07-31 ENCOUNTER — Emergency Department (HOSPITAL_BASED_OUTPATIENT_CLINIC_OR_DEPARTMENT_OTHER)
Admission: EM | Admit: 2018-07-31 | Discharge: 2018-08-01 | Disposition: A | Discharge status: Home / Self Care | Payer: Medicare Other | Attending: Emergency Medicine | Admitting: Emergency Medicine

## 2018-07-31 ENCOUNTER — Emergency Department (HOSPITAL_BASED_OUTPATIENT_CLINIC_OR_DEPARTMENT_OTHER): Payer: Medicare Other

## 2018-07-31 ENCOUNTER — Encounter (HOSPITAL_BASED_OUTPATIENT_CLINIC_OR_DEPARTMENT_OTHER): Payer: Self-pay

## 2018-07-31 DIAGNOSIS — S5011XA Contusion of right forearm, initial encounter: Secondary | ICD-10-CM | POA: Insufficient documentation

## 2018-07-31 DIAGNOSIS — Y929 Unspecified place or not applicable: Secondary | ICD-10-CM | POA: Insufficient documentation

## 2018-07-31 DIAGNOSIS — W1781XA Fall down embankment (hill), initial encounter: Secondary | ICD-10-CM | POA: Diagnosis not present

## 2018-07-31 DIAGNOSIS — S59911A Unspecified injury of right forearm, initial encounter: Secondary | ICD-10-CM | POA: Diagnosis not present

## 2018-07-31 DIAGNOSIS — Z79899 Other long term (current) drug therapy: Secondary | ICD-10-CM | POA: Diagnosis not present

## 2018-07-31 DIAGNOSIS — Y9389 Activity, other specified: Secondary | ICD-10-CM | POA: Diagnosis not present

## 2018-07-31 DIAGNOSIS — S2241XA Multiple fractures of ribs, right side, initial encounter for closed fracture: Secondary | ICD-10-CM | POA: Insufficient documentation

## 2018-07-31 DIAGNOSIS — S7011XA Contusion of right thigh, initial encounter: Secondary | ICD-10-CM | POA: Insufficient documentation

## 2018-07-31 DIAGNOSIS — I10 Essential (primary) hypertension: Secondary | ICD-10-CM | POA: Insufficient documentation

## 2018-07-31 DIAGNOSIS — M545 Low back pain: Secondary | ICD-10-CM | POA: Diagnosis not present

## 2018-07-31 DIAGNOSIS — I2510 Atherosclerotic heart disease of native coronary artery without angina pectoris: Secondary | ICD-10-CM | POA: Insufficient documentation

## 2018-07-31 DIAGNOSIS — Z23 Encounter for immunization: Secondary | ICD-10-CM | POA: Insufficient documentation

## 2018-07-31 DIAGNOSIS — S3992XA Unspecified injury of lower back, initial encounter: Secondary | ICD-10-CM | POA: Diagnosis not present

## 2018-07-31 DIAGNOSIS — W19XXXA Unspecified fall, initial encounter: Secondary | ICD-10-CM

## 2018-07-31 DIAGNOSIS — Y999 Unspecified external cause status: Secondary | ICD-10-CM | POA: Diagnosis not present

## 2018-07-31 DIAGNOSIS — M79631 Pain in right forearm: Secondary | ICD-10-CM | POA: Diagnosis not present

## 2018-07-31 DIAGNOSIS — J449 Chronic obstructive pulmonary disease, unspecified: Secondary | ICD-10-CM | POA: Diagnosis not present

## 2018-07-31 DIAGNOSIS — S5010XA Contusion of unspecified forearm, initial encounter: Secondary | ICD-10-CM

## 2018-07-31 MED ORDER — TETANUS-DIPHTH-ACELL PERTUSSIS 5-2.5-18.5 LF-MCG/0.5 IM SUSP
0.5000 mL | Freq: Once | INTRAMUSCULAR | Status: AC
  Administered 2018-07-31: 0.5 mL via INTRAMUSCULAR
  Filled 2018-07-31: qty 0.5

## 2018-07-31 NOTE — ED Triage Notes (Signed)
Pt fell ~ 1 hour PTA-lost balance on driveway fell into rocks-pain to lower back, right LE, right UE-NAD-slow gait

## 2018-07-31 NOTE — ED Provider Notes (Signed)
[facility] Provider Note   CSN: [id number] Arrival date & time: 07/31/18  2134     History   Chief Complaint Chief Complaint  Patient presents with  . Fall    HPI [name] is a 77 y.o. male.  Patient is a 77 year old male who presents after a fall.  He states he was getting out of a car that was parked a little too close to the edge of the driveway and he accidentally slipped backward off the edge of the driveway into a gulley with rocks.  He fell onto his right side.  He has pain in his right forearm and some bruising in his right thigh.  He also has some pain in his right ribs and low back.  He does have a history of ankylosing spondylitis.  He has no numbness or weakness to his extremities.  No shortness of breath.  No abdominal pain.  He is adamant that he did not hit his head.  He has no neck pain.  No loss of consciousness.  He is on a baby aspirin but no other anticoagulants.  He does have a history of thrombocytopenia.  His last tetanus is unknown.     Past Medical History:  Diagnosis Date  . Abnormal chest x-ray   . Allergic rhinitis   . Ankylosing spondylitis ([place])   . Anxiety   . Arthritis   . Colonic polyp   . Coronary atherosclerosis of native coronary artery   . Cough   . Dizziness 10/21/2016  . Ganglion cyst of wrist    left  . Heart attack ([place]) 2008  . Hemorrhoids   . History of malaria   . Hyperlipidemia    with low HDL  . Hypertension   . Kidney stone   . Other disorder of muscle, ligament, and fascia   . Rotator cuff tear   . Swelling of lower extremity 10/16/2015    Patient Active Problem List   Diagnosis Date Noted  . GERD (gastroesophageal reflux disease) 12/15/2017  . Unstable angina ([place]) 12/05/2017  . Angina at rest [facility]) 12/05/2017  . Atypical chest pain   . Dyslipidemia   . COPD not affecting current episode of care [facility])   . Essential hypertension 05/29/2017  . Abnormal nuclear cardiac imaging  test 01/14/2017  . Bronchiectasis without complication ([place]) 62/83/1517  . ILD (interstitial lung disease) ([place]) 11/27/2016  . Dizziness 10/21/2016  . Cough variant asthma  vs UACS 07/22/2016  . Obstructive sleep apnea 12/01/2015  . Acute bronchitis 11/30/2015  . Swelling of lower extremity 10/16/2015  . Benign mole 02/04/2014  . Impaired glucose tolerance 10/26/2013  . NAFLD (nonalcoholic fatty liver disease) 04/27/2013  . Thrombocytopenia ([place]) 10/21/2012  . Exertional angina ([facility]) 09/03/2012  . MALARIA 12/05/2009  . COLONIC POLYPS 12/05/2009  . HYPERCHOLESTEROLEMIA 12/05/2009  . ANXIETY 12/05/2009  . HEMORRHOIDS 12/05/2009  . Allergic rhinitis 12/05/2009  . ANKYLOSING SPONDYLITIS 12/05/2009  . GANGLION CYST, WRIST, LEFT 12/05/2009  . ROTATOR CUFF TEAR 12/05/2009  . OTHER DISORDER OF MUSCLE LIGAMENT AND FASCIA 12/05/2009  . ABNORMAL CHEST XRAY 12/05/2009  . CAD (coronary artery disease) 04/05/2009    Past Surgical History:  Procedure Laterality Date  . CORONARY ANGIOPLASTY WITH STENT PLACEMENT    . HEMORRHOID SURGERY  10/10   in [place]  . LEFT HEART CATH AND CORONARY ANGIOGRAPHY N/A 01/14/2017   Procedure: Left Heart Cath and Coronary Angiography;  Surgeon: [name], MD;  Location: [facility];  Service: Cardiovascular;  Laterality: N/A;  . LEFT HEART CATHETERIZATION WITH CORONARY ANGIOGRAM N/A 09/03/2012   Procedure: LEFT HEART CATHETERIZATION WITH CORONARY ANGIOGRAM;  Surgeon: [name], MD;  Location: [facility] CATH LAB;  Service: Cardiovascular;  Laterality: N/A;  . ROTATOR CUFF REPAIR     bilat by Dr. French Ana        Home Medications    Prior to Admission medications   Medication Sig Start Date End Date Taking? Authorizing Provider  albuterol (VENTOLIN HFA) 108 (90 Base) MCG/ACT inhaler Inhale 2 puffs into the lungs every 6 (six) hours as needed for wheezing or shortness of breath.    [provider]  amLODipine (NORVASC) 5 MG tablet TAKE 1 TABLET  EVERY DAY 05/12/18   [name], MD  Apoaequorin (PREVAGEN PO) Take 1 tablet by mouth daily.     [provider]  aspirin 81 MG EC tablet Take 81 mg by mouth daily.     [provider]  Azelastine-Fluticasone (DYMISTA) 137-50 MCG/ACT SUSP Place into the nose.    [provider]  budesonide-formoterol (SYMBICORT) 80-4.5 MCG/ACT inhaler Take 2 puffs first thing in am and then another 2 puffs about 12 hours later. 05/19/18   [name], MD  chlorpheniramine (CHLOR-TRIMETON) 4 MG tablet Take 4 mg by mouth 2 (two) times daily as needed for allergies.    [provider]  dextromethorphan-guaiFENesin (MUCINEX DM) 30-600 MG 12hr tablet Take 2 tablets by mouth 2 (two) times daily as needed for cough.    [provider]  famotidine (PEPCID) 20 MG tablet Take 20 mg by mouth at bedtime.    [provider]  Multiple Vitamins-Minerals (ICAPS AREDS 2) CAPS Take by mouth daily.    [provider]  pantoprazole (PROTONIX) 40 MG tablet Take 1 tablet (40 mg total) by mouth daily. Take 30-60 min before first meal of the day 06/12/18   [name], MD  predniSONE (DELTASONE) 10 MG tablet Take  4 each am x 2 days,   2 each am x 2 days,  1 each am x 2 days and stop 06/19/18   [name], MD  Respiratory Therapy Supplies (FLUTTER) DEVI by Does not apply route as directed.    [provider]  simvastatin (ZOCOR) 20 MG tablet TAKE 1 TABLET EVERY DAY WITH BREAKFAST 02/16/18   [name], MD  UNABLE TO FIND Med Name: CPAP per Dr Halford Chessman    [provider]    Family History Family History  Problem Relation Age of Onset  . Cancer Mother        melanoma  . Emphysema Mother   . Rheum arthritis Mother   . Diabetes Mother     Social History Social History   Tobacco Use  . Smoking status: Never Smoker  . Smokeless tobacco: Never Used  Substance Use Topics  . Alcohol use: Yes  . Drug use: No     Allergies    Tramadol   Review of Systems Review of Systems  Constitutional: Negative for activity change, appetite change and fever.  HENT: Negative for dental problem, nosebleeds and trouble swallowing.   Eyes: Negative for pain and visual disturbance.  Respiratory: Negative for shortness of breath.   Cardiovascular: Positive for chest pain (Mild right rib pain).  Gastrointestinal: Negative for abdominal pain, nausea and vomiting.  Genitourinary: Negative for dysuria and hematuria.  Musculoskeletal: Positive for arthralgias and back pain. Negative for joint swelling and neck pain.  Skin:  Positive for wound.  Neurological: Negative for weakness, numbness and headaches.  Psychiatric/Behavioral: Negative for confusion.     Physical Exam Updated Vital Signs BP (!) 158/67 (BP Location: Left Arm)   Pulse 85   Temp 98 F (36.7 C) (Oral)   Resp 18   Wt 85.5 kg   SpO2 97%   BMI 31.14 kg/m   Physical Exam Vitals signs reviewed.  Constitutional:      Appearance: He is well-developed.  HENT:     Head: Normocephalic and atraumatic.     Nose: Nose normal.  Eyes:     Conjunctiva/sclera: Conjunctivae normal.     Pupils: Pupils are equal, round, and reactive to light.  Neck:     Comments: No pain to the cervical, thoracic.  Mild tenderness to the left lower lumbar spine.  No step-offs or deformities noted Cardiovascular:     Rate and Rhythm: Normal rate and regular rhythm.     Heart sounds: No murmur.     Comments: No evidence of external trauma to the chest or abdomen Pulmonary:     Effort: Pulmonary effort is normal. No respiratory distress.     Breath sounds: Normal breath sounds. No wheezing.  Chest:     Chest wall: Tenderness (Mild tenderness to the right lateral and posterior ribs, no crepitus or deformity) present.  Abdominal:     General: Bowel sounds are normal. There is no distension.     Palpations: Abdomen is soft.     Tenderness: There is no abdominal tenderness.   Musculoskeletal: Normal range of motion.     Comments: There is a large hematoma to the right midforearm with some mild underlying bony tenderness.  There is no overlying abrasion, no active bleeding radial pulses are intact.  He has normal sensation and motor function distally.  There is no bony tenderness to the wrist elbow or shoulder.  He has a small hematoma to the right lateral thigh.  There is no overlying wound.  There is no pain on range of motion of the hip or knee.  No pain to the ankle.  Pedal pulses are intact.  There is no other pain on palpation or range of motion of the extremities.  Skin:    General: Skin is warm and dry.     Capillary Refill: Capillary refill takes less than 2 seconds.  Neurological:     Mental Status: He is alert and oriented to person, place, and time.      ED Treatments / Results  Labs (all labs ordered are listed, but only abnormal results are displayed) Labs Reviewed - No data to display  EKG None  Radiology Dg Ribs Unilateral W/chest Right  Result Date: 07/31/2018 CLINICAL DATA:  Fall with rib pain EXAM: RIGHT RIBS AND CHEST - 3+ VIEW COMPARISON:  12/05/2017 FINDINGS: Single-view chest demonstrates chronic elevation of right diaphragm. No acute consolidation or effusion. Stable cardiomediastinal silhouette. No pneumothorax. Postsurgical changes in both humeral heads. Right rib series demonstrates old right sixth, seventh, and eighth rib fractures. Acute right ninth and tenth rib fractures. IMPRESSION: 1. Negative for pneumothorax or pleural effusion 2. Acute right ninth and tenth and rib fractures Electronically Signed   By: Donavan Foil M.D.   On: 07/31/2018 23:19   Dg Lumbar Spine Complete  Result Date: 07/31/2018 CLINICAL DATA:  Fall with back pain EXAM: LUMBAR SPINE - COMPLETE 4+ VIEW COMPARISON:  CT 05/19/2017 FINDINGS: Lumbar alignment within normal limits. Aortic atherosclerosis. Vertebral body heights are normal.  Diffuse osteophytosis of  the lumbar spine. Posterior facet degenerative change L4 through S1. Mild disc space narrowing at L4-L5 and L5-S1. IMPRESSION: Degenerative changes.  No acute osseous abnormality. Electronically Signed   By: Donavan Foil M.D.   On: 07/31/2018 23:16   Dg Forearm Right  Result Date: 07/31/2018 CLINICAL DATA:  Fall with forearm pain EXAM: RIGHT FOREARM - 2 VIEW COMPARISON:  01/05/2013 FINDINGS: Os or old injury adjacent to the ulnar styloid. No acute displaced fracture or malalignment. Rectangular opacity projecting over the proximal radius with lucency in the adjacent bone. Focal soft tissue protuberance dorsal aspect of the mid forearm. IMPRESSION: 1. No acute osseous abnormality. 2. Rectangular foreign body or postsurgical change at the proximal radius. Lucency within the proximal radius, could be secondary to prior surgical intervention. 3. Soft tissue protuberance/hematoma dorsal mid forearm Electronically Signed   By: Donavan Foil M.D.   On: 07/31/2018 23:08    Procedures Procedures (including critical care time)  Medications Ordered in ED Medications  Tdap (BOOSTRIX) injection 0.5 mL (0.5 mLs Intramuscular Given 07/31/18 2318)     Initial Impression / Assessment and Plan / ED Course  I have reviewed the triage vital signs and the nursing notes.  Pertinent labs & imaging results that were available during my care of the patient were reviewed by me and considered in my medical decision making (see chart for details).     Patient is a 77 year old male who presents after mechanical fall.  He denies any head injury.  He has a hematoma to his right forearm without underlying bony injury.  Lumbar spine x-rays reveal no evidence of acute abnormality.  He does have evidence of 2 rib fractures on the right.  There is no evidence of pneumothorax.  He has no hypoxia.  He actually is standing up dressed and ready to go on my reevaluation.  He does not want anything for pain.  He has fairly mild  tenderness on exam.  I have a very low suspicion for underlying lung injury or other chest trauma.  He has no abdominal tenderness.  He was discharged home in good condition.  He was given an incentive spirometer.  He denies need for any kind of prescription pain medication.  He states he will take Tylenol at home.  He was given strict return precautions.  Final Clinical Impressions(s) / ED Diagnoses   Final diagnoses:  Fall, initial encounter  Traumatic hematoma of forearm, initial encounter  Closed fracture of multiple ribs of right side, initial encounter    ED Discharge Orders    None       Malvin Johns, MD 07/31/18 2336

## 2018-09-21 ENCOUNTER — Ambulatory Visit (INDEPENDENT_AMBULATORY_CARE_PROVIDER_SITE_OTHER): Payer: Medicare Other | Admitting: Internal Medicine

## 2018-09-21 ENCOUNTER — Encounter: Payer: Self-pay | Admitting: Internal Medicine

## 2018-09-21 VITALS — BP 129/70 | HR 70 | Ht 67.0 in | Wt 182.0 lb

## 2018-09-21 DIAGNOSIS — J479 Bronchiectasis, uncomplicated: Secondary | ICD-10-CM | POA: Diagnosis not present

## 2018-09-21 DIAGNOSIS — J45991 Cough variant asthma: Secondary | ICD-10-CM | POA: Diagnosis not present

## 2018-09-21 NOTE — Progress Notes (Signed)
Brief patient profile:  11  yowm never smoker with ankylosing spondilitis  with new onset variable  cough since Oct 2016 comes and goes s background of asthma but with background of nasal allergies for which took shots and moved residence in 1980s  >  No further problems except nasal drip /[place]neezing [place] then fine when stopped going there.    History of Present Illness  Congestion in chest x Oct 2016 worse in evenings with mostly dry coughing and much worse x sev weeks > Sood eval on 07/15/16 cough for a week/ albterol and pred no better  rec Advair one puff twice per day until sample completed >> call if you feel you need a refill after this Zithromax 250 mg pill >> 2 pills on day 1, then 1 pill daily for next four days Albuterol 2 puffs every 4 to 6 hours as needed for cough, wheeze, or chest congestion Finish course of prednisone    07/22/2016 acute extended ov/Cloys Vera re:  Refractory cough > sob on advair and pred taper and getting worse Chief Complaint  Patient presents with  . Acute Visit    Increased SOB and cough are not improving since acute ov here 07/15/16. His cough has been occ prod with light colored sputum.  He states that his breathing does okay in the am's and then gets worse in the evenings.   on advair dpi/ benzoate and took pred 10 mg one tablet  This am and cough seems ok when he wakes up and progressively worse with Minimal actual sputum production as the day goes on and then does not really bother him while sleeping.. Shaking on details of care/ names of meds/ no better with saba  rec Take delsym two tsp every 12 hours and supplement if needed with  tramadol 50 mg up to 1 every 4 hours  .   Symbicort 80 Take 2 puffs first thing in am and then another 2 puffs about 12 hours later - if helping refill it Only use your albuterol (ventolin) inhaler  As long as coughing at all:  Try prilosec otc 20mg   Take 30-60 min before first meal of the day  and Pepcid ac (famotidine) 20 mg one @  bedtime until cough is completely gone for at least a week without the need for cough suppression GERD diet    07/30/2016 acute extended ov/Addylin Manke re: refractory cough since late Nov 2017  Chief Complaint  Patient presents with  . Acute Visit    Pt c/o increased cough and congestion for the past 2 wks. He states symptoms get worse in the evening. Cough is prod in the am's with thick, clear sputum.  He states he can not lie down flat due to cough and cough sometimes waked him up. He also c/o wheezing.   brought all his meds, not using symb 2bid per count, only missing 11 tramadol "it's for pain, right? "  Some better while on pred, worse since finished it/ only taking prilosec otc 20 mg qam / is taking pepcid 20 mg hs  Not using any saba "it's for emergencies, right?"  rec Prednisone 10 mg take  4 each am x 2 days,   2 each am x 2 days,  1 each am x 2 days and stop  Increase the prilosec ac to where you take x 2 30-60 mn before breakfast  And continue the pepcid ac 20mg  in evening  Continue symbicort 80 (same  as dulera 100 sample) Take 2 puffs first thing in am and then another 2 puffs about 12 hours later.  Only use your albuterol as a rescue medication  For cough use mucinex dm 1200 mg every 12 hours and supplement with the tramadol up to 2 every 4 hours until no cough x 3 days  Please see patient coordinator before you leave today  to schedule sinus CT    08/21/16  Restart Symbicort 2 puffs Twice daily  .  Restart Prilosec 20mg  daily before meal  Restart Pepcid 20mg  At bedtime   May use Zyrtec 10mg  At bedtime  As needed  Drainage  Zpack take as directed.  Mucinex DM Twice daily  As needed  Cough/congestion .     10/10/2016 acute extended ov/Alleene Stoy re:  uacs vs asthma  maint on symb 80 2bid / gerd rx with otcs only  Chief Complaint  Patient presents with  . Acute Visit    Pt c/o cough and increased SOB x 1 wk, and progressively worse over the past  3-4 days. His cough is prod with thick, clear sputum. He states can not lie down flat without cough.   sensation of something stuck never gone away since = oct 2016 then acutely worse sob/ cough x 1 week prior to OV  And not better p saba Does not have mucinex dm / no zyrtec  rec First take delsym two tsp every 12 hours and supplement if needed with  tramadol 50 mg up to 2 every 4 hours to suppress the urge to cough at all or even clear your throat. Swallowing water or using ice chips/non mint and menthol containing candies (such as lifesavers or sugarless jolly ranchers) are also effective.  You should rest your voice and avoid activities that you know make you cough. Once you have eliminated the cough for 3 straight days try reducing the tramadol first,  then the delsym as tolerated.   Prednisone 10 mg take  4 each am x 2 days,   2 each am x 2 days,  1 each am x 2 days and stop (this is to eliminate allergies and inflammation from coughing) Protonix (pantoprazole) Take 30-60 min before first meal of the day and Pepcid 20 mg one bedtime plus chlorpheniramine 4 mg x 2 at bedtime (both available over the counter)  until cough is completely gone for at least a week without the need for cough suppression For drainage / throat tickle try take CHLORPHENIRAMINE  4 mg - take one every 4 hours as needed - available over the counter- may cause drowsiness so start with just a bedtime dose or two and see how you tolerate it before trying in daytime   GERD diet  Stop prilosec/ mucinex  Please remember to go to the lab  department downstairs in the basement  for your tests - we will call you with the results when they are available.  Please schedule a follow up office visit in 2 weeks, sooner if needed  with all medications /inhalers/ solutions in hand so we can verify exactly what you are taking. This includes all medications from all doctors and over the counters Add did not got to lab as rec    10/17/2016  f/u  ov/Nery Kalisz re: intermittent cough x oct 2016 def gets better on prednisone / sense of chest congestion but no excess mucus Chief Complaint  Patient presents with  . Follow-up    PFT's done today.  symptoms of daily  cough  worse p supper Did not bring meds as requested / not clear he even read them when reading them back line by line  Not using any albuterol "doesn't help"  Not able to lie down due to cough x month x no better even while on prednisone though "80% better" on pred vs off it/ no longer using cpap rec Add singulair each evening 10 daily trial basis  Please remember to go to the lab  department downstairs in the basement  for your tests - we will call you with the results when they are available. See Tammy NP in 4  weeks with all your medications, same page with your medication use she will arrange follow up with me or with allergy depending on your tests  Add : consider adding flutter if doesn't have one and HRCT looking for bronchiectasis > POS    05/19/2018  f/u ov/Bena Kobel re: bronchiectasis/ chronic cough/ never took demerol, only took a few prednisone > 95% resolution at med cal ov 03/03/18 so d/c'd singulair and not taking symb 80 as maint (brought med calendar/ not following as written) Chief Complaint  Patient presents with  . Follow-up    increased chest congestion and cough x 3-4 wks. He is coughing up some cream colored sputum.   Dyspnea:  Still able to jog / steps ok   Cough: was better for months but never completely gone then worse x 4 weeks esp  In am Sleeping: 2- 3 pillows flat bed - no bed blocks SABA use: not using at  02: none  rec rec Bronchiectasis =   you have scarring of your bronchial tubes  Whenever you develop cough congestion take  mucinex dm up to 1200 mg every 12 hours  >  Symbicort is supposed to be 1-2 puffs 1st thing in am and repeat  every 12 hours if you think you need it or not If cough getting worse >  Prednisone 10 mg take  4 each am x 2 days,    2 each am x 2 days,  1 each am x 2 days and stop  Consider placing 6 in bed blocks under the head of your bed (available at most mattress stores)       06/19/2018  f/u ov/Afton Mikelson re: bronchiectasis/ did not follow any of the action plans on med calendar he brought with him , still on symb 80 just 1 bid despite flare of cough/ not using mucienx dm or flutter approp Chief Complaint  Patient presents with  . Follow-up    non prod cough x 1 wk.  He has not had to use his prn pred or albuterol.   Dyspnea:  Still able to jog and go up to steps Cough: non prod cough  Worse when wake up but non productive  Sleeping: 3 pillows no bed blocks SABA use: no  rec Symbicort 80 should be 2  Every 12 hours unless you are doing great See calendar for specific medication instructions      09/21/2018  f/u ov/Temesha Queener re: Bronchiectasis  Chief Complaint  Patient presents with  . Follow-up    Breathing is overall doing well. He has not had to use the prn pred taper or albuterol.   Dyspnea:  Walks a mile with the dog / up some up some hills  Cough: gone now  Sleeping: to pee/ special pillow, no bed blocks  SABA use: none      No obvious day to day or  daytime variability or assoc excess/ purulent sputum or mucus plugs or hemoptysis or cp or chest tightness, subjective wheeze or overt sinus or hb symptoms.   Sleeping as above flat  without nocturnal  or early am exacerbation  of respiratory  c/o's or need for noct saba. Also denies any obvious fluctuation of symptoms with weather or environmental changes or other aggravating or alleviating factors except as outlined above   No unusual exposure hx or h/o childhood pna/ asthma or knowledge of premature birth.  Current Allergies, Complete Past Medical History, Past Surgical History, Family History, and Social History were reviewed in [employer] [employer] record.  ROS  The following are not active complaints unless bolded Hoarseness, sore throat,  dysphagia, dental problems, itching, sneezing,  nasal congestion or discharge of excess mucus or purulent secretions, ear ache,   fever, chills, sweats, unintended wt loss or wt gain, classically pleuritic or exertional cp,  orthopnea pnd or arm/hand swelling  or leg swelling, presyncope, palpitations, abdominal pain, anorexia, nausea, vomiting, diarrhea  or change in bowel habits or change in bladder habits, change in stools or change in urine, dysuria, hematuria,  rash, arthralgias, visual complaints, headache, numbness, weakness or ataxia or problems with walking or coordination,  change in mood or  memory.        Current Meds  Medication Sig  . albuterol (VENTOLIN HFA) 108 (90 Base) MCG/ACT inhaler Inhale 2 puffs into the lungs every 6 (six) hours as needed for wheezing or shortness of breath.  [name] amLODipine (NORVASC) 5 MG tablet TAKE 1 TABLET EVERY DAY  . Apoaequorin (PREVAGEN PO) Take 1 tablet by mouth daily.   [name] aspirin 81 MG EC tablet Take 81 mg by mouth daily.   . Azelastine-Fluticasone (DYMISTA) 137-50 MCG/ACT SUSP Place into the nose.  . budesonide-formoterol (SYMBICORT) 80-4.5 MCG/ACT inhaler Take 2 puffs first thing in am and then another 2 puffs about 12 hours later.  . chlorpheniramine (CHLOR-TRIMETON) 4 MG tablet Take 4 mg by mouth 2 (two) times daily as needed for allergies.  [name] dextromethorphan-guaiFENesin (MUCINEX DM) 30-600 MG 12hr tablet Take 2 tablets by mouth 2 (two) times daily as needed for cough.  . famotidine (PEPCID) 20 MG tablet Take 20 mg by mouth at bedtime.  . Multiple Vitamins-Minerals (ICAPS AREDS 2) CAPS Take by mouth daily.  . pantoprazole (PROTONIX) 40 MG tablet Take 1 tablet (40 mg total) by mouth daily. Take 30-60 min before first meal of the day  . Respiratory Therapy Supplies (FLUTTER) DEVI by Does not apply route as directed.  . simvastatin (ZOCOR) 20 MG tablet TAKE 1 TABLET EVERY DAY WITH BREAKFAST  . UNABLE TO FIND Med Name: CPAP per Dr Halford Chessman                     Pulmonary tests CT angio chest 11/30/15 >> motion artifact, mosaic pattern, calcified granuloma SNIFF test 11/30/15 >> elevated Rt diaphragm, but has b/l diaphragm movement  Cardiac tests 10/16/15 Doppler legs b/l >> no DVT 11/30/15 Echo >> EF 65 to 08%, grade 1 diastolic CHF  Sleep tests HST 12/21/15 >> AHI 44.5, SpO2 low 64% Auto CPAP 03/02/16 to 03/31/16 >> used on 26 of 30 nights with average 6 hrs 32 min.  Average AHI 5.5 with median CPAP 8 and 95 th percentile CPAP 11 cm H2O ONO with CPAP 04/10/16 >> test time 6 hrs 30 min.  Mean SpO2 93.51%, low SpO2 77%.  Spent 7 min with SpO2 <  88%   Past medical history Ankylosing spondylitis, Allergies, CAD, HLD, HTN, Nephrolithiasis, Malaria      Physical Exam   09/21/2018      182  02/18/2018       186  02/13/2018         190  11/17/2017         186  05/20/2017       190  01/15/2017         183  12/12/2016         186  10/17/2016         183  10/10/2016         187  07/30/2016     191  07/22/16 194 lb (88 kg)  07/15/16 196 lb (88.9 kg)  07/12/16 192 lb 9.6 oz (87.4 kg)    amb wm nad  Vital signs reviewed - Note on arrival 02 sats  92% on RA         HEENT: nl dentition, turbinates bilaterally, and oropharynx. Nl external ear canals without cough reflex   NECK :  without JVD/Nodes/TM/ nl carotid upstrokes bilaterally   LUNGS: no acc muscle use,  Nl contour chest with minimal insp crackles in bases  bilaterally without cough on insp or exp maneuvers   CV:  RRR  no s3 or murmur or increase in P2, and no edema   ABD:  soft and nontender with nl inspiratory excursion in the supine position. No bruits or organomegaly appreciated, bowel sounds nl  MS:  Nl gait/ ext warm without deformities, calf tenderness, cyanosis or clubbing No obvious joint restrictions   SKIN: warm and dry without lesions    NEURO:  alert, approp, nl sensorium with  no motor or cerebellar deficits apparent.          Assessment/Plan

## 2018-09-21 NOTE — Patient Instructions (Signed)
Again I recommend the 6-8 inch bed under the head of the bed  We changed the symbicort 80 to where you only take it up to 2 puffs every 12 hours for cough/ congestion as needed as your "plan A"    See Tammy NP in 3 months with all your medications, even over the counter meds, separated in two separate bags, the ones you take no matter what vs the ones you stop once you feel better and take only as needed when you feel you need them.   Tammy  will generate for you a new user friendly medication calendar that will put [place] all on the same page re: your medication use.     Without this process, it simply isn't possible to assure that we are providing  your outpatient care  with  the attention to detail we feel you deserve.   If we cannot assure that you're getting that kind of care,  then we cannot manage your problem effectively from this clinic.  Once you have seen Tammy and we are sure that we're all on the same page with your medication use she will arrange follow up with me.

## 2018-09-23 ENCOUNTER — Encounter: Payer: Self-pay | Admitting: Internal Medicine

## 2018-09-23 NOTE — Assessment & Plan Note (Addendum)
See CT chest 11/26/16 = Scattered areas of cylindrical bronchiectasis - flutter added 12/12/2016  - prevnar 13 give 05/19/2018   No flares/ contingencies discussed   I had an extended discussion with the patient reviewing all relevant studies completed to date and  lasting 15 to 20 minutes of a 25 minute visit    Each maintenance medication was reviewed in detail including most importantly the difference between maintenance and prns and under what circumstances the prns are to be triggered using an action plan format that is not reflected in the computer generated alphabetically organized AVS but trather by a customized med calendar that reflects the AVS meds with confirmed 100% correlation.   In addition, Please see AVS for unique instructions that I personally wrote and verbalized to the the pt in detail and then reviewed with pt  by my nurse highlighting any  changes in therapy recommended at today's visit to their plan of care.  .       F/u in 3 m for new med calendar

## 2018-09-23 NOTE — Assessment & Plan Note (Signed)
Onset Oct 2016  07/22/2016    > try reduce symbicort to 80 2bid  - FENO 07/15/2016  =   42 on sym 160 2bid  - 07/30/2016  After extensive coaching HFA effectiveness =    90% > continue symb 80 2bid  - Sinus CT 07/31/2016 >>> neg - FENO 10/10/2016  =   12 during flare while on symb 80 2bid - PFT's  10/17/2016  FEV1 2.40 (89 % ) ratio 87  p 0 % improvement from saba p symb 80 x 2 prior to study with DLCO  60/58 % corrects to 90 % for alv volume and erv =50%  - Allergy profile 10/17/2016 >  Eos 0.0 /  IgE 55 Rast pos dust only   - 10/17/2016  Added singulair 10 mg  - HRCT chest 11/2016  -NSIP Changes/Bronchiectasis noted  - 12/12/2016 spirometry s obst with active symptoms >> trial off symbicort as not convinced helping - 01/13/17 trial of dymista > improved 05/19/2017 but did not continue it due to cost  - 11/10/17 d/c'd ppi and did not refill > 11/17/17 rec ok to leave off but restart prilosec for cough  - 02/07/18 recurrent severe cough on last day of [place] cruise > did not follow action plan  - FENO 02/18/2018  =   11 during flare p am prednisone - Spirometry 02/18/2018  No obst on no rx  Prior with active wheeze on exam - 03/03/18 d/c'd singulair  - 05/19/2018  After extensive coaching inhaler device,  effectiveness =    90% restart symb 80 1-2 q 12h   All goals of chronic asthma control met including optimal function and elimination of symptoms with minimal need for rescue therapy.  Contingencies discussed in full including contacting this office immediately if not controlling the symptoms using the rule of two's.     Based on two studies from NEJM  378; 20 p 1865 (2018) and 380 : p2020-30 (2019) in pts with mild asthma it is reasonable to use low dose symbicort eg 80 2bid "prn" flare in this setting but I emphasized this was only shown with symbicort and takes advantage of the rapid onset of action but is not the same as "rescue therapy" but can be stopped once the acute symptoms have resolved and the need for  rescue has been minimized (< 2 x weekly)

## 2018-09-28 ENCOUNTER — Other Ambulatory Visit: Payer: Self-pay | Admitting: Cardiovascular Disease

## 2018-09-30 DIAGNOSIS — M9901 Segmental and somatic dysfunction of cervical region: Secondary | ICD-10-CM | POA: Diagnosis not present

## 2018-09-30 DIAGNOSIS — M9903 Segmental and somatic dysfunction of lumbar region: Secondary | ICD-10-CM | POA: Diagnosis not present

## 2018-09-30 DIAGNOSIS — M545 Low back pain: Secondary | ICD-10-CM | POA: Diagnosis not present

## 2018-09-30 DIAGNOSIS — M791 Myalgia, unspecified site: Secondary | ICD-10-CM | POA: Diagnosis not present

## 2018-09-30 DIAGNOSIS — M5136 Other intervertebral disc degeneration, lumbar region: Secondary | ICD-10-CM | POA: Diagnosis not present

## 2018-09-30 DIAGNOSIS — M5126 Other intervertebral disc displacement, lumbar region: Secondary | ICD-10-CM | POA: Diagnosis not present

## 2018-09-30 DIAGNOSIS — M47816 Spondylosis without myelopathy or radiculopathy, lumbar region: Secondary | ICD-10-CM | POA: Diagnosis not present

## 2018-09-30 DIAGNOSIS — M6240 Contracture of muscle, unspecified site: Secondary | ICD-10-CM | POA: Diagnosis not present

## 2018-10-06 DIAGNOSIS — M19072 Primary osteoarthritis, left ankle and foot: Secondary | ICD-10-CM | POA: Diagnosis not present

## 2018-10-06 DIAGNOSIS — M67371 Transient synovitis, right ankle and foot: Secondary | ICD-10-CM | POA: Diagnosis not present

## 2018-10-06 DIAGNOSIS — M71571 Other bursitis, not elsewhere classified, right ankle and foot: Secondary | ICD-10-CM | POA: Diagnosis not present

## 2018-10-06 DIAGNOSIS — M19071 Primary osteoarthritis, right ankle and foot: Secondary | ICD-10-CM | POA: Diagnosis not present

## 2018-10-06 DIAGNOSIS — M67372 Transient synovitis, left ankle and foot: Secondary | ICD-10-CM | POA: Diagnosis not present

## 2018-10-06 DIAGNOSIS — M722 Plantar fascial fibromatosis: Secondary | ICD-10-CM | POA: Diagnosis not present

## 2018-10-06 DIAGNOSIS — M71572 Other bursitis, not elsewhere classified, left ankle and foot: Secondary | ICD-10-CM | POA: Diagnosis not present

## 2018-10-07 ENCOUNTER — Encounter: Payer: Self-pay | Admitting: Cardiovascular Disease

## 2018-10-07 ENCOUNTER — Ambulatory Visit (INDEPENDENT_AMBULATORY_CARE_PROVIDER_SITE_OTHER): Payer: Medicare Other | Admitting: Cardiovascular Disease

## 2018-10-07 ENCOUNTER — Encounter

## 2018-10-07 VITALS — BP 144/66 | HR 71 | Ht 67.0 in | Wt 185.8 lb

## 2018-10-07 DIAGNOSIS — I2510 Atherosclerotic heart disease of native coronary artery without angina pectoris: Secondary | ICD-10-CM | POA: Diagnosis not present

## 2018-10-07 DIAGNOSIS — M9903 Segmental and somatic dysfunction of lumbar region: Secondary | ICD-10-CM | POA: Diagnosis not present

## 2018-10-07 DIAGNOSIS — M25561 Pain in right knee: Secondary | ICD-10-CM | POA: Diagnosis not present

## 2018-10-07 DIAGNOSIS — M25562 Pain in left knee: Secondary | ICD-10-CM | POA: Diagnosis not present

## 2018-10-07 DIAGNOSIS — M5126 Other intervertebral disc displacement, lumbar region: Secondary | ICD-10-CM | POA: Diagnosis not present

## 2018-10-07 DIAGNOSIS — I10 Essential (primary) hypertension: Secondary | ICD-10-CM | POA: Diagnosis not present

## 2018-10-07 DIAGNOSIS — M1711 Unilateral primary osteoarthritis, right knee: Secondary | ICD-10-CM | POA: Diagnosis not present

## 2018-10-07 DIAGNOSIS — M1712 Unilateral primary osteoarthritis, left knee: Secondary | ICD-10-CM | POA: Diagnosis not present

## 2018-10-07 DIAGNOSIS — M47816 Spondylosis without myelopathy or radiculopathy, lumbar region: Secondary | ICD-10-CM | POA: Diagnosis not present

## 2018-10-07 DIAGNOSIS — M5136 Other intervertebral disc degeneration, lumbar region: Secondary | ICD-10-CM | POA: Diagnosis not present

## 2018-10-07 DIAGNOSIS — E782 Mixed hyperlipidemia: Secondary | ICD-10-CM | POA: Diagnosis not present

## 2018-10-07 DIAGNOSIS — M6240 Contracture of muscle, unspecified site: Secondary | ICD-10-CM | POA: Diagnosis not present

## 2018-10-07 DIAGNOSIS — M9901 Segmental and somatic dysfunction of cervical region: Secondary | ICD-10-CM | POA: Diagnosis not present

## 2018-10-07 DIAGNOSIS — M791 Myalgia, unspecified site: Secondary | ICD-10-CM | POA: Diagnosis not present

## 2018-10-07 NOTE — Progress Notes (Signed)
Cardiology Office Note:    Date:  10/07/2018   ID:  [name], DOB 1941/03/26, MRN [id number]  PCP:  [name], MD  Cardiologist:  [name], MD  Electrophysiologist:  None   Referring MD: [name], MD   Chief Complaint  Patient presents with  . Coronary Artery Disease    History of Present Illness:    [name] is a 78 y.o. male with a hx of hypertension, hyperlipidemia, arthritis, ankylosing spondylitis, kidney stones, OSA and CAD status post anterior STEMI with V. fib arrest in 2008 with stenting of the LAD and multiple caths since then including June 2018 with no progression of disease and patent LAD stent.  Note: Prior stress tests have been abnormal.    He is here alone today. He's been struggling with symptoms related to arthritis in his feet that he describes on 'bone on bone.'   He denies any cardiac limitation. Today, he denies symptoms of palpitations, chest pain, shortness of breath, orthopnea, PND, lower extremity edema, dizziness, or syncope.  Past Medical History:  Diagnosis Date  . Abnormal chest x-ray   . Allergic rhinitis   . Ankylosing spondylitis ([place])   . Anxiety   . Arthritis   . Colonic polyp   . Coronary atherosclerosis of native coronary artery   . Cough   . Dizziness 10/21/2016  . Ganglion cyst of wrist    left  . Heart attack ([place]) 2008  . Hemorrhoids   . History of malaria   . Hyperlipidemia    with low HDL  . Hypertension   . Kidney stone   . Other disorder of muscle, ligament, and fascia   . Rotator cuff tear   . Swelling of lower extremity 10/16/2015    Past Surgical History:  Procedure Laterality Date  . CORONARY ANGIOPLASTY WITH STENT PLACEMENT    . HEMORRHOID SURGERY  10/10   in [place]  . LEFT HEART CATH AND CORONARY ANGIOGRAPHY N/A 01/14/2017   Procedure: Left Heart Cath and Coronary Angiography;  Surgeon: [name], MD;  Location: [facility];  Service: Cardiovascular;  Laterality: N/A;    . LEFT HEART CATHETERIZATION WITH CORONARY ANGIOGRAM N/A 09/03/2012   Procedure: LEFT HEART CATHETERIZATION WITH CORONARY ANGIOGRAM;  Surgeon: [name], MD;  Location: [facility] CATH LAB;  Service: Cardiovascular;  Laterality: N/A;  . ROTATOR CUFF REPAIR     bilat by Dr. French Ana    Current Medications: Current Meds  Medication Sig  . amLODipine (NORVASC) 5 MG tablet TAKE 1 TABLET EVERY DAY  . Apoaequorin (PREVAGEN PO) Take 1 tablet by mouth daily.   [name] aspirin 81 MG EC tablet Take 81 mg by mouth daily.   . budesonide-formoterol (SYMBICORT) 80-4.5 MCG/ACT inhaler Inhale 2 puffs into the lungs as needed.  . chlorpheniramine (CHLOR-TRIMETON) 4 MG tablet Take 4 mg by mouth 2 (two) times daily as needed for allergies.  [name] dextromethorphan-guaiFENesin (MUCINEX DM) 30-600 MG 12hr tablet Take 2 tablets by mouth 2 (two) times daily as needed for cough.  . famotidine (PEPCID) 20 MG tablet Take 20 mg by mouth at bedtime.  . Multiple Vitamins-Minerals (ICAPS AREDS 2) CAPS Take by mouth daily.  . pantoprazole (PROTONIX) 40 MG tablet Take 1 tablet (40 mg total) by mouth daily. Take 30-60 min before first meal of the day  . Respiratory Therapy Supplies (FLUTTER) DEVI by Does not apply route as directed.  . simvastatin (ZOCOR) 20 MG tablet TAKE 1 TABLET EVERY DAY  WITH BREAKFAST  . UNABLE TO FIND Med Name: CPAP per Dr Halford Chessman     Allergies:   Tramadol   Social History   Socioeconomic History  . Marital status: Married    Spouse name: [name]  . Number of children: 2  . Years of education: Not on file  . Highest education level: Not on file  Occupational History  . Not on file  Social Needs  . Financial resource strain: Not on file  . Food insecurity:    Worry: Not on file    Inability: Not on file  . Transportation needs:    Medical: Not on file    Non-medical: Not on file  Tobacco Use  . Smoking status: Never Smoker  . Smokeless tobacco: Never Used  Substance and Sexual Activity  . Alcohol  use: Yes  . Drug use: No  . Sexual activity: Not on file  Lifestyle  . Physical activity:    Days per week: Not on file    Minutes per session: Not on file  . Stress: Not on file  Relationships  . Social connections:    Talks on phone: Not on file    Gets together: Not on file    Attends religious service: Not on file    Active member of club or organization: Not on file    Attends meetings of clubs or organizations: Not on file    Relationship status: Not on file  Other Topics Concern  . Not on file  Social History Narrative  . Not on file     Family History: The patient's family history includes Cancer in his mother; Diabetes in his mother; Emphysema in his mother; Rheum arthritis in his mother.  ROS:   Please see the history of present illness.    Positive for back pain, balance problems, generalized fatigue.  All other systems reviewed and are negative.  EKGs/Labs/Other Studies Reviewed:    The following studies were reviewed today: Echo 11/30/2015: Study Conclusions  - Left ventricle: The cavity size was normal. There was mild   concentric hypertrophy. Systolic function was vigorous. The   estimated ejection fraction was in the range of 65% to 70%. Wall   motion was normal; there were no regional wall motion   abnormalities. Doppler parameters are consistent with abnormal   left ventricular relaxation (grade 1 diastolic dysfunction).   Doppler parameters are consistent with elevated ventricular   end-diastolic filling pressure. - Aortic valve: Trileaflet; normal thickness leaflets. There was no   regurgitation. - Aortic root: The aortic root was normal in size. - Mitral valve: Structurally normal valve. - Left atrium: The atrium was mildly dilated. - Right ventricle: The cavity size was normal. Wall thickness was   normal. Systolic function was normal. - Right atrium: The atrium was normal in size. - Tricuspid valve: There was no regurgitation. - Pulmonic  valve: There was no regurgitation. - Pulmonary arteries: Systolic pressure was within the normal   range. - Inferior vena cava: The vessel was normal in size. - Pericardium, extracardiac: There was no pericardial effusion.  Nuclear stress test 01/08/2017: Study Highlights     Nuclear stress EF: 57%.  Blood pressure demonstrated a normal response to exercise.  Horizontal ST segment depression ST segment depression of 2 mm was noted during stress in the V4, V5, V6, II, III and aVF leads, and returning to baseline after 5-9 minutes of recovery.  This is an intermediate risk study.  The left ventricular ejection fraction  is normal (55-65%).   1. Exercise stress ECG was suggestive of ischemia.  The patient was very short of breath with exertion.  2. Perfusion images showed a fixed, small, mild basal anterior perfusion defect and a fixed, medium-sized, mild basal to mid inferior perfusion defect.  The anterior defect does not appear to correspond to a vascular territory and may be artifact.  Cannot rule out inferior infarction though wall motion is normal in the inferior wall.  No evidence for ischemia.  3. Normal EF and wall motion.   Intermediate risk study, the stress ECG is most concerning though the perfusion images are not normal.    Cardiac Catheterization 01-14-2017: Conclusion   1. Continued patency of the LAD stented segment with no significant restenosis 2. Severe stenosis of a small diagonal branch unchanged from the previous study in 2014 3. Mild nonobstructive disease of the right coronary artery and left circumflex 4. Normal LVEDP with known normal LV systolic function by noninvasive assessment  Recommendations: Continued medical therapy. I don't think the patient's diagonal stenosis is causing any symptoms and clearly wouldn't be related to his exertional dyspnea. It may however be the reason he has an abnormal stress ECG. Recommend ongoing medical therapy with initiation  of an exercise program. The diagonal is small and not well suited for PCI.   Coronary Findings   Diagnostic  Dominance: Right  Left Anterior Descending  Prox LAD to Mid LAD lesion 0% stenosed  Prox LAD to Mid LAD lesion with no stenosis was previously treated.  First Diagonal Branch  Vessel is small in size.  1st Diag lesion 75% stenosed  Small diagonal branch with severe stenosis unchanged from the previous study  Ramus Intermedius  Vessel is small. There is mild the vessel.  Left Circumflex  There is mild the vessel.  Right Coronary Artery  There is mild the vessel.  Mid RCA lesion 40% stenosed  There is mild stenosis unchanged from the previous study  Intervention   No interventions have been documented.  Coronary Diagrams   Diagnostic  Dominance: Right      EKG:  EKG is not ordered today.   Recent Labs: 10/20/2017: ALT 33 12/05/2017: BUN 21; Creatinine, Ser 0.95; Potassium 3.6; Sodium 140 04/23/2018: Hemoglobin 15.4; Platelet Count 129  Recent Lipid Panel    Component Value Date/Time   CHOL 105 01/08/2017 0754   TRIG 57 01/08/2017 0754   HDL 39 (L) 01/08/2017 0754   CHOLHDL 2.7 01/08/2017 0754   CHOLHDL 2.8 12/18/2015 0831   VLDL 14 12/18/2015 0831   LDLCALC 55 01/08/2017 0754    Physical Exam:    VS:  BP (!) 144/66   Pulse 71   Ht 5\' 7"  (1.702 m)   Wt 185 lb 12.8 oz (84.3 kg)   SpO2 95%   BMI 29.10 kg/m     Wt Readings from Last 3 Encounters:  10/07/18 185 lb 12.8 oz (84.3 kg)  09/21/18 182 lb (82.6 kg)  07/31/18 188 lb 9.6 oz (85.5 kg)     GEN:  Well nourished, well developed in no acute distress HEENT: Normal NECK: No JVD; No carotid bruits LYMPHATICS: No lymphadenopathy CARDIAC: RRR, no murmurs, rubs, gallops RESPIRATORY:  Clear to auscultation without rales, wheezing or rhonchi  ABDOMEN: Soft, non-tender, non-distended MUSCULOSKELETAL:  No edema; No deformity  SKIN: Warm and dry NEUROLOGIC:  Alert and oriented x 3 PSYCHIATRIC:  Normal  affect   ASSESSMENT:    1. Mixed hyperlipidemia   2. Coronary  artery disease involving native coronary artery of native heart without angina pectoris   3. Essential hypertension    PLAN:    In order of problems listed above:  1. The patient is treated with a statin drug.  I reviewed his labs and his last lipid panel was in 2018 with a cholesterol of 105, HDL 39, and LDL 55.  I am going to update his lipids and LFTs. 2. The patient is stable with no anginal symptoms.  He is tolerating his medical program which will be continued without change.  I reviewed all of his studies with him today including his most recent echocardiogram, nuclear stress test, and cardiac catheterization studies.  He understands that he has a favorable prognosis with preserved LV function and stability of symptoms. 3. Blood pressure is controlled on his current program which includes amlodipine.   Medication Adjustments/Labs and Tests Ordered: Current medicines are reviewed at length with the patient today.  Concerns regarding medicines are outlined above.  Orders Placed This Encounter  Procedures  . Comprehensive metabolic panel  . Lipid panel   No orders of the defined types were placed in this encounter.   Patient Instructions  Medication Instructions:  Your provider recommends that you continue on your current medications as directed. Please refer to the Current Medication list given to you today.   If you need a refill on your cardiac medications before your next appointment, please call your pharmacy.   Lab work: Your provider recommends that you return for FASTING lab work. If you have labs (blood work) drawn today and your tests are completely normal, you will receive your results only by: [name] MyChart Message (if you have MyChart) OR . A paper copy in the mail If you have any lab test that is abnormal or we need to change your treatment, we will call you to review the results.  Follow-Up: At [facility], you and your health needs are our priority.  As part of our continuing mission to provide you with exceptional heart care, we have created designated Provider Care Teams.  These Care Teams include your primary Cardiologist (physician) and Advanced Practice Providers (APPs -  Physician Assistants and Nurse Practitioners) who all work together to provide you with the care you need, when you need it. You will need a follow up appointment in:  12 months.  Please call our office 2 months in advance to schedule this appointment.  You may see [name], MD or one of the following Advanced Practice Providers on your designated Care Team: Richardson Dopp, PA-C [place], [name] . Daune Perch, NP    Signed, [name], MD  10/07/2018 12:44 PM    [place]

## 2018-10-07 NOTE — Patient Instructions (Signed)
Medication Instructions:  Your provider recommends that you continue on your current medications as directed. Please refer to the Current Medication list given to you today.   If you need a refill on your cardiac medications before your next appointment, please call your pharmacy.   Lab work: Your provider recommends that you return for FASTING lab work. If you have labs (blood work) drawn today and your tests are completely normal, you will receive your results only by: [name] MyChart Message (if you have MyChart) OR . A paper copy in the mail If you have any lab test that is abnormal or we need to change your treatment, we will call you to review the results.  Follow-Up: At [facility], you and your health needs are our priority.  As part of our continuing mission to provide you with exceptional heart care, we have created designated Provider Care Teams.  These Care Teams include your primary Cardiologist (physician) and Advanced Practice Providers (APPs -  Physician Assistants and Nurse Practitioners) who all work together to provide you with the care you need, when you need it. You will need a follow up appointment in:  12 months.  Please call our office 2 months in advance to schedule this appointment.  You may see [name], MD or one of the following Advanced Practice Providers on your designated Care Team: Richardson Dopp, PA-C [place], [name] . Daune Perch, NP

## 2018-10-08 ENCOUNTER — Other Ambulatory Visit: Payer: Medicare Other | Admitting: *Deleted

## 2018-10-08 DIAGNOSIS — E782 Mixed hyperlipidemia: Secondary | ICD-10-CM

## 2018-10-09 LAB — COMPREHENSIVE METABOLIC PANEL
ALT: 23 IU/L (ref 0–44)
AST: 21 IU/L (ref 0–40)
Albumin/Globulin Ratio: 2.7 — ABNORMAL HIGH (ref 1.2–2.2)
Albumin: 4.9 g/dL — ABNORMAL HIGH (ref 3.7–4.7)
Alkaline Phosphatase: 69 IU/L (ref 39–117)
BUN/Creatinine Ratio: 21 (ref 10–24)
BUN: 19 mg/dL (ref 8–27)
Bilirubin Total: 0.4 mg/dL (ref 0.0–1.2)
CO2: 25 mmol/L (ref 20–29)
Calcium: 9.7 mg/dL (ref 8.6–10.2)
Chloride: 103 mmol/L (ref 96–106)
Creatinine, Ser: 0.92 mg/dL (ref 0.76–1.27)
GFR calc Af Amer: 94 mL/min/{1.73_m2} (ref >59)
GFR calc non Af Amer: 81 mL/min/{1.73_m2} (ref >59)
Globulin, Total: 1.8 g/dL (ref 1.5–4.5)
Glucose: 107 mg/dL — ABNORMAL HIGH (ref 65–99)
Potassium: 4.2 mmol/L (ref 3.5–5.2)
Sodium: 145 mmol/L — ABNORMAL HIGH (ref 134–144)
Total Protein: 6.7 g/dL (ref 6.0–8.5)

## 2018-10-09 LAB — LIPID PANEL
CHOLESTEROL TOTAL: 109 mg/dL (ref 100–199)
Chol/HDL Ratio: 2.5 ratio (ref 0.0–5.0)
HDL: 44 mg/dL (ref >39)
LDL CALC: 52 mg/dL (ref 0–99)
Triglycerides: 67 mg/dL (ref 0–149)
VLDL Cholesterol Cal: 13 mg/dL (ref 5–40)

## 2018-10-13 DIAGNOSIS — M722 Plantar fascial fibromatosis: Secondary | ICD-10-CM | POA: Diagnosis not present

## 2018-10-13 DIAGNOSIS — M67371 Transient synovitis, right ankle and foot: Secondary | ICD-10-CM | POA: Diagnosis not present

## 2018-10-14 DIAGNOSIS — M25562 Pain in left knee: Secondary | ICD-10-CM | POA: Diagnosis not present

## 2018-10-14 DIAGNOSIS — M9901 Segmental and somatic dysfunction of cervical region: Secondary | ICD-10-CM | POA: Diagnosis not present

## 2018-10-14 DIAGNOSIS — M47816 Spondylosis without myelopathy or radiculopathy, lumbar region: Secondary | ICD-10-CM | POA: Diagnosis not present

## 2018-10-14 DIAGNOSIS — M1712 Unilateral primary osteoarthritis, left knee: Secondary | ICD-10-CM | POA: Diagnosis not present

## 2018-10-14 DIAGNOSIS — M6240 Contracture of muscle, unspecified site: Secondary | ICD-10-CM | POA: Diagnosis not present

## 2018-10-14 DIAGNOSIS — M25561 Pain in right knee: Secondary | ICD-10-CM | POA: Diagnosis not present

## 2018-10-14 DIAGNOSIS — M9903 Segmental and somatic dysfunction of lumbar region: Secondary | ICD-10-CM | POA: Diagnosis not present

## 2018-10-14 DIAGNOSIS — M791 Myalgia, unspecified site: Secondary | ICD-10-CM | POA: Diagnosis not present

## 2018-10-14 DIAGNOSIS — M5136 Other intervertebral disc degeneration, lumbar region: Secondary | ICD-10-CM | POA: Diagnosis not present

## 2018-10-14 DIAGNOSIS — M5126 Other intervertebral disc displacement, lumbar region: Secondary | ICD-10-CM | POA: Diagnosis not present

## 2018-10-14 DIAGNOSIS — M1711 Unilateral primary osteoarthritis, right knee: Secondary | ICD-10-CM | POA: Diagnosis not present

## 2018-10-14 NOTE — Telephone Encounter (Signed)
Reiterated to [name] that his lab results were stable - his lipids are at goal, LFTs normal. And Dr. Burt Knack said his labs look good. He was grateful for call.

## 2018-10-22 ENCOUNTER — Inpatient Hospital Stay (HOSPITAL_BASED_OUTPATIENT_CLINIC_OR_DEPARTMENT_OTHER): Payer: Medicare Other | Admitting: Internal Medicine

## 2018-10-22 ENCOUNTER — Inpatient Hospital Stay: Payer: Medicare Other | Attending: Internal Medicine

## 2018-10-22 ENCOUNTER — Other Ambulatory Visit: Payer: Self-pay

## 2018-10-22 ENCOUNTER — Encounter: Payer: Self-pay | Admitting: Internal Medicine

## 2018-10-22 VITALS — BP 129/68 | HR 58 | Temp 98.7°F | Resp 18 | Ht 67.0 in | Wt 180.5 lb

## 2018-10-22 DIAGNOSIS — I2510 Atherosclerotic heart disease of native coronary artery without angina pectoris: Secondary | ICD-10-CM | POA: Diagnosis not present

## 2018-10-22 DIAGNOSIS — D696 Thrombocytopenia, unspecified: Secondary | ICD-10-CM

## 2018-10-22 DIAGNOSIS — Z79899 Other long term (current) drug therapy: Secondary | ICD-10-CM | POA: Insufficient documentation

## 2018-10-22 DIAGNOSIS — D693 Immune thrombocytopenic purpura: Secondary | ICD-10-CM | POA: Diagnosis not present

## 2018-10-22 LAB — CBC WITH DIFFERENTIAL (CANCER CENTER ONLY)
Abs Immature Granulocytes: 0 10*3/uL (ref 0.00–0.07)
Basophils Absolute: 0 10*3/uL (ref 0.0–0.1)
Basophils Relative: 0 %
Eosinophils Absolute: 0.2 10*3/uL (ref 0.0–0.5)
Eosinophils Relative: 2 %
HCT: 46.2 % (ref 39.0–52.0)
Hemoglobin: 15 g/dL (ref 13.0–17.0)
IMMATURE GRANULOCYTES: 0 %
Lymphocytes Relative: 19 %
Lymphs Abs: 1.3 10*3/uL (ref 0.7–4.0)
MCH: 29.6 pg (ref 26.0–34.0)
MCHC: 32.5 g/dL (ref 30.0–36.0)
MCV: 91.3 fL (ref 80.0–100.0)
Monocytes Absolute: 0.6 10*3/uL (ref 0.1–1.0)
Monocytes Relative: 9 %
Neutro Abs: 4.6 10*3/uL (ref 1.7–7.7)
Neutrophils Relative %: 70 %
Platelet Count: 178 10*3/uL (ref 150–400)
RBC: 5.06 MIL/uL (ref 4.22–5.81)
RDW: 15 % (ref 11.5–15.5)
WBC Count: 6.7 10*3/uL (ref 4.0–10.5)
nRBC: 0 % (ref 0.0–0.2)

## 2018-10-22 LAB — LACTATE DEHYDROGENASE: LDH: 146 U/L (ref 98–192)

## 2018-10-22 NOTE — Progress Notes (Signed)
[facility] Telephone:([phone]) [phone]   Fax:([phone]) [phone]  OFFICE PROGRESS NOTE  [name], MD [place] [address]  DIAGNOSIS: Thrombocytopenia, most likely idiopathic thrombocytopenic purpura.  PRIOR THERAPY:None.  CURRENT THERAPY: observation.  INTERVAL HISTORY: [name] 78 y.o. male returns to the clinic today for follow-up visit.  The patient is feeling fine today with no concerning complaints.  He denied having any chest pain, shortness of breath, cough or hemoptysis.  He denied having any bleeding, bruises or ecchymosis.  He has no nausea, vomiting, diarrhea or constipation.  He was seen recently by his cardiologist and was given good report.  He is here today for evaluation and repeat CBC for evaluation of his thrombocytopenia.  MEDICAL HISTORY: Past Medical History:  Diagnosis Date  . Abnormal chest x-ray   . Allergic rhinitis   . Ankylosing spondylitis ([place])   . Anxiety   . Arthritis   . Colonic polyp   . Coronary atherosclerosis of native coronary artery   . Cough   . Dizziness 10/21/2016  . Ganglion cyst of wrist    left  . Heart attack ([place]) 2008  . Hemorrhoids   . History of malaria   . Hyperlipidemia    with low HDL  . Hypertension   . Kidney stone   . Other disorder of muscle, ligament, and fascia   . Rotator cuff tear   . Swelling of lower extremity 10/16/2015    ALLERGIES:  is allergic to tramadol.  MEDICATIONS:  Current Outpatient Medications  Medication Sig Dispense Refill  . amLODipine (NORVASC) 5 MG tablet TAKE 1 TABLET EVERY DAY 90 tablet 2  . Apoaequorin (PREVAGEN PO) Take 1 tablet by mouth daily.     [name] aspirin 81 MG EC tablet Take 81 mg by mouth daily.     . budesonide-formoterol (SYMBICORT) 80-4.5 MCG/ACT inhaler Inhale 2 puffs into the lungs as needed.    . chlorpheniramine (CHLOR-TRIMETON) 4 MG tablet Take 4 mg by mouth 2 (two) times daily as needed for allergies.    [name]  dextromethorphan-guaiFENesin (MUCINEX DM) 30-600 MG 12hr tablet Take 2 tablets by mouth 2 (two) times daily as needed for cough.    . famotidine (PEPCID) 20 MG tablet Take 20 mg by mouth at bedtime.    . Multiple Vitamins-Minerals (ICAPS AREDS 2) CAPS Take by mouth daily.    . pantoprazole (PROTONIX) 40 MG tablet Take 1 tablet (40 mg total) by mouth daily. Take 30-60 min before first meal of the day 30 tablet 2  . Respiratory Therapy Supplies (FLUTTER) DEVI by Does not apply route as directed.    . simvastatin (ZOCOR) 20 MG tablet TAKE 1 TABLET EVERY DAY WITH BREAKFAST 90 tablet 1  . UNABLE TO FIND Med Name: CPAP per Dr Halford Chessman     No current facility-administered medications for this visit.     SURGICAL HISTORY:  Past Surgical History:  Procedure Laterality Date  . CORONARY ANGIOPLASTY WITH STENT PLACEMENT    . HEMORRHOID SURGERY  10/10   in [place]  . LEFT HEART CATH AND CORONARY ANGIOGRAPHY N/A 01/14/2017   Procedure: Left Heart Cath and Coronary Angiography;  Surgeon: [name], MD;  Location: [facility];  Service: Cardiovascular;  Laterality: N/A;  . LEFT HEART CATHETERIZATION WITH CORONARY ANGIOGRAM N/A 09/03/2012   Procedure: LEFT HEART CATHETERIZATION WITH CORONARY ANGIOGRAM;  Surgeon: [name], MD;  Location: [facility] CATH LAB;  Service: Cardiovascular;  Laterality: N/A;  .  ROTATOR CUFF REPAIR     bilat by Dr. French Ana    REVIEW OF SYSTEMS:  A comprehensive review of systems was negative.   PHYSICAL EXAMINATION: General appearance: alert, cooperative and no distress Head: Normocephalic, without obvious abnormality, atraumatic Neck: no adenopathy Lymph nodes: Cervical, supraclavicular, and axillary nodes normal. Resp: clear to auscultation bilaterally Back: symmetric, no curvature. ROM normal. No CVA tenderness. Cardio: regular rate and rhythm, S1, S2 normal, no murmur, click, rub or gallop GI: soft, non-tender; bowel sounds normal; no masses,  no organomegaly  Extremities: extremities normal, atraumatic, no cyanosis or edema  ECOG PERFORMANCE STATUS: 1 - Symptomatic but completely ambulatory  Blood pressure 129/68, pulse (!) 58, temperature 98.7 F (37.1 C), temperature source Oral, resp. rate 18, height 5\' 7"  (1.702 m), weight 180 lb 8 oz (81.9 kg), SpO2 97 %.  LABORATORY DATA: Lab Results  Component Value Date   WBC 6.7 10/22/2018   HGB 15.0 10/22/2018   HCT 46.2 10/22/2018   MCV 91.3 10/22/2018   PLT 178 10/22/2018      Chemistry      Component Value Date/Time   NA 145 (H) 10/08/2018 1125   NA 141 04/21/2017 0823   K 4.2 10/08/2018 1125   K 4.0 04/21/2017 0823   CL 103 10/08/2018 1125   CL 106 09/30/2012 0949   CO2 25 10/08/2018 1125   CO2 25 04/21/2017 0823   BUN 19 10/08/2018 1125   BUN 20.2 04/21/2017 0823   CREATININE 0.92 10/08/2018 1125   CREATININE 0.9 04/21/2017 0823      Component Value Date/Time   CALCIUM 9.7 10/08/2018 1125   CALCIUM 9.6 04/21/2017 0823   ALKPHOS 69 10/08/2018 1125   ALKPHOS 61 04/21/2017 0823   AST 21 10/08/2018 1125   AST 22 04/21/2017 0823   ALT 23 10/08/2018 1125   ALT 36 04/21/2017 0823   BILITOT 0.4 10/08/2018 1125   BILITOT 0.99 04/21/2017 0823     BONE MARROW REPORT FINAL DIAGNOSIS RADIOGRAPHIC STUDIES: No results found.  ASSESSMENT AND PLAN:  This is a very pleasant 78 years old white male with idiopathic thrombocytopenic purpura who is currently on observation.  He has CBC performed earlier today that showed platelets count of 1 78,000. The patient is currently asymptomatic. I recommended for him to continue on observation with repeat CBC in 6 months. He was advised to call immediately if he has any concerning symptoms in the interval. All questions were answered. The patient knows to call the clinic with any problems, questions or concerns. We can certainly see the patient much sooner if necessary.  Disclaimer: This note was dictated with voice recognition software.  Similar sounding words can inadvertently be transcribed and may not be corrected upon review.

## 2018-10-23 ENCOUNTER — Telehealth: Payer: Self-pay | Admitting: Internal Medicine

## 2018-10-23 DIAGNOSIS — M6240 Contracture of muscle, unspecified site: Secondary | ICD-10-CM | POA: Diagnosis not present

## 2018-10-23 DIAGNOSIS — M1711 Unilateral primary osteoarthritis, right knee: Secondary | ICD-10-CM | POA: Diagnosis not present

## 2018-10-23 DIAGNOSIS — M25561 Pain in right knee: Secondary | ICD-10-CM | POA: Diagnosis not present

## 2018-10-23 DIAGNOSIS — M9901 Segmental and somatic dysfunction of cervical region: Secondary | ICD-10-CM | POA: Diagnosis not present

## 2018-10-23 DIAGNOSIS — M47816 Spondylosis without myelopathy or radiculopathy, lumbar region: Secondary | ICD-10-CM | POA: Diagnosis not present

## 2018-10-23 DIAGNOSIS — M5126 Other intervertebral disc displacement, lumbar region: Secondary | ICD-10-CM | POA: Diagnosis not present

## 2018-10-23 DIAGNOSIS — M5136 Other intervertebral disc degeneration, lumbar region: Secondary | ICD-10-CM | POA: Diagnosis not present

## 2018-10-23 DIAGNOSIS — M1712 Unilateral primary osteoarthritis, left knee: Secondary | ICD-10-CM | POA: Diagnosis not present

## 2018-10-23 DIAGNOSIS — M25562 Pain in left knee: Secondary | ICD-10-CM | POA: Diagnosis not present

## 2018-10-23 DIAGNOSIS — M9903 Segmental and somatic dysfunction of lumbar region: Secondary | ICD-10-CM | POA: Diagnosis not present

## 2018-10-23 DIAGNOSIS — M791 Myalgia, unspecified site: Secondary | ICD-10-CM | POA: Diagnosis not present

## 2018-10-23 NOTE — Telephone Encounter (Signed)
Scheduled appt per 3/12 los - sent reminder letter in the mail - lab and f/u in 6 months

## 2018-10-28 DIAGNOSIS — M9903 Segmental and somatic dysfunction of lumbar region: Secondary | ICD-10-CM | POA: Diagnosis not present

## 2018-10-28 DIAGNOSIS — M791 Myalgia, unspecified site: Secondary | ICD-10-CM | POA: Diagnosis not present

## 2018-10-28 DIAGNOSIS — M47816 Spondylosis without myelopathy or radiculopathy, lumbar region: Secondary | ICD-10-CM | POA: Diagnosis not present

## 2018-10-28 DIAGNOSIS — M5126 Other intervertebral disc displacement, lumbar region: Secondary | ICD-10-CM | POA: Diagnosis not present

## 2018-10-28 DIAGNOSIS — M545 Low back pain: Secondary | ICD-10-CM | POA: Diagnosis not present

## 2018-10-28 DIAGNOSIS — M9901 Segmental and somatic dysfunction of cervical region: Secondary | ICD-10-CM | POA: Diagnosis not present

## 2018-10-28 DIAGNOSIS — M6240 Contracture of muscle, unspecified site: Secondary | ICD-10-CM | POA: Diagnosis not present

## 2018-10-28 DIAGNOSIS — M5136 Other intervertebral disc degeneration, lumbar region: Secondary | ICD-10-CM | POA: Diagnosis not present

## 2018-11-04 DIAGNOSIS — M791 Myalgia, unspecified site: Secondary | ICD-10-CM | POA: Diagnosis not present

## 2018-11-04 DIAGNOSIS — M6240 Contracture of muscle, unspecified site: Secondary | ICD-10-CM | POA: Diagnosis not present

## 2018-11-04 DIAGNOSIS — M47816 Spondylosis without myelopathy or radiculopathy, lumbar region: Secondary | ICD-10-CM | POA: Diagnosis not present

## 2018-11-04 DIAGNOSIS — M5136 Other intervertebral disc degeneration, lumbar region: Secondary | ICD-10-CM | POA: Diagnosis not present

## 2018-11-04 DIAGNOSIS — M9903 Segmental and somatic dysfunction of lumbar region: Secondary | ICD-10-CM | POA: Diagnosis not present

## 2018-11-04 DIAGNOSIS — M545 Low back pain: Secondary | ICD-10-CM | POA: Diagnosis not present

## 2018-11-04 DIAGNOSIS — M5126 Other intervertebral disc displacement, lumbar region: Secondary | ICD-10-CM | POA: Diagnosis not present

## 2018-11-04 DIAGNOSIS — M9901 Segmental and somatic dysfunction of cervical region: Secondary | ICD-10-CM | POA: Diagnosis not present

## 2018-11-06 DIAGNOSIS — Z0000 Encounter for general adult medical examination without abnormal findings: Secondary | ICD-10-CM | POA: Diagnosis not present

## 2018-11-09 DIAGNOSIS — M5126 Other intervertebral disc displacement, lumbar region: Secondary | ICD-10-CM | POA: Diagnosis not present

## 2018-11-09 DIAGNOSIS — M9903 Segmental and somatic dysfunction of lumbar region: Secondary | ICD-10-CM | POA: Diagnosis not present

## 2018-11-09 DIAGNOSIS — M545 Low back pain: Secondary | ICD-10-CM | POA: Diagnosis not present

## 2018-11-09 DIAGNOSIS — M9901 Segmental and somatic dysfunction of cervical region: Secondary | ICD-10-CM | POA: Diagnosis not present

## 2018-11-09 DIAGNOSIS — M6240 Contracture of muscle, unspecified site: Secondary | ICD-10-CM | POA: Diagnosis not present

## 2018-11-09 DIAGNOSIS — M791 Myalgia, unspecified site: Secondary | ICD-10-CM | POA: Diagnosis not present

## 2018-11-09 DIAGNOSIS — M5136 Other intervertebral disc degeneration, lumbar region: Secondary | ICD-10-CM | POA: Diagnosis not present

## 2018-11-09 DIAGNOSIS — M50323 Other cervical disc degeneration at C6-C7 level: Secondary | ICD-10-CM | POA: Diagnosis not present

## 2018-11-09 DIAGNOSIS — M50223 Other cervical disc displacement at C6-C7 level: Secondary | ICD-10-CM | POA: Diagnosis not present

## 2018-11-12 DIAGNOSIS — Z85828 Personal history of other malignant neoplasm of skin: Secondary | ICD-10-CM | POA: Diagnosis not present

## 2018-11-12 DIAGNOSIS — D229 Melanocytic nevi, unspecified: Secondary | ICD-10-CM | POA: Diagnosis not present

## 2018-11-12 DIAGNOSIS — L819 Disorder of pigmentation, unspecified: Secondary | ICD-10-CM | POA: Diagnosis not present

## 2018-11-12 DIAGNOSIS — L814 Other melanin hyperpigmentation: Secondary | ICD-10-CM | POA: Diagnosis not present

## 2018-11-12 DIAGNOSIS — D1801 Hemangioma of skin and subcutaneous tissue: Secondary | ICD-10-CM | POA: Diagnosis not present

## 2018-11-12 DIAGNOSIS — L821 Other seborrheic keratosis: Secondary | ICD-10-CM | POA: Diagnosis not present

## 2018-11-12 DIAGNOSIS — D485 Neoplasm of uncertain behavior of skin: Secondary | ICD-10-CM | POA: Diagnosis not present

## 2018-11-12 DIAGNOSIS — L570 Actinic keratosis: Secondary | ICD-10-CM | POA: Diagnosis not present

## 2018-11-20 DIAGNOSIS — M6240 Contracture of muscle, unspecified site: Secondary | ICD-10-CM | POA: Diagnosis not present

## 2018-11-20 DIAGNOSIS — M791 Myalgia, unspecified site: Secondary | ICD-10-CM | POA: Diagnosis not present

## 2018-11-20 DIAGNOSIS — M5126 Other intervertebral disc displacement, lumbar region: Secondary | ICD-10-CM | POA: Diagnosis not present

## 2018-11-20 DIAGNOSIS — M5136 Other intervertebral disc degeneration, lumbar region: Secondary | ICD-10-CM | POA: Diagnosis not present

## 2018-11-20 DIAGNOSIS — M9903 Segmental and somatic dysfunction of lumbar region: Secondary | ICD-10-CM | POA: Diagnosis not present

## 2018-11-20 DIAGNOSIS — M9901 Segmental and somatic dysfunction of cervical region: Secondary | ICD-10-CM | POA: Diagnosis not present

## 2018-11-20 DIAGNOSIS — M50323 Other cervical disc degeneration at C6-C7 level: Secondary | ICD-10-CM | POA: Diagnosis not present

## 2018-11-20 DIAGNOSIS — M50223 Other cervical disc displacement at C6-C7 level: Secondary | ICD-10-CM | POA: Diagnosis not present

## 2018-11-20 DIAGNOSIS — M542 Cervicalgia: Secondary | ICD-10-CM | POA: Diagnosis not present

## 2018-11-22 ENCOUNTER — Other Ambulatory Visit: Payer: Self-pay | Admitting: Internal Medicine

## 2018-11-25 DIAGNOSIS — M50323 Other cervical disc degeneration at C6-C7 level: Secondary | ICD-10-CM | POA: Diagnosis not present

## 2018-11-25 DIAGNOSIS — M5126 Other intervertebral disc displacement, lumbar region: Secondary | ICD-10-CM | POA: Diagnosis not present

## 2018-11-25 DIAGNOSIS — M50223 Other cervical disc displacement at C6-C7 level: Secondary | ICD-10-CM | POA: Diagnosis not present

## 2018-11-25 DIAGNOSIS — M5136 Other intervertebral disc degeneration, lumbar region: Secondary | ICD-10-CM | POA: Diagnosis not present

## 2018-11-25 DIAGNOSIS — M6240 Contracture of muscle, unspecified site: Secondary | ICD-10-CM | POA: Diagnosis not present

## 2018-11-25 DIAGNOSIS — M9903 Segmental and somatic dysfunction of lumbar region: Secondary | ICD-10-CM | POA: Diagnosis not present

## 2018-11-25 DIAGNOSIS — M791 Myalgia, unspecified site: Secondary | ICD-10-CM | POA: Diagnosis not present

## 2018-11-25 DIAGNOSIS — M9901 Segmental and somatic dysfunction of cervical region: Secondary | ICD-10-CM | POA: Diagnosis not present

## 2018-12-02 DIAGNOSIS — M50323 Other cervical disc degeneration at C6-C7 level: Secondary | ICD-10-CM | POA: Diagnosis not present

## 2018-12-02 DIAGNOSIS — M5136 Other intervertebral disc degeneration, lumbar region: Secondary | ICD-10-CM | POA: Diagnosis not present

## 2018-12-02 DIAGNOSIS — M6240 Contracture of muscle, unspecified site: Secondary | ICD-10-CM | POA: Diagnosis not present

## 2018-12-02 DIAGNOSIS — M50223 Other cervical disc displacement at C6-C7 level: Secondary | ICD-10-CM | POA: Diagnosis not present

## 2018-12-02 DIAGNOSIS — M9903 Segmental and somatic dysfunction of lumbar region: Secondary | ICD-10-CM | POA: Diagnosis not present

## 2018-12-02 DIAGNOSIS — M5126 Other intervertebral disc displacement, lumbar region: Secondary | ICD-10-CM | POA: Diagnosis not present

## 2018-12-02 DIAGNOSIS — M9901 Segmental and somatic dysfunction of cervical region: Secondary | ICD-10-CM | POA: Diagnosis not present

## 2018-12-02 DIAGNOSIS — M791 Myalgia, unspecified site: Secondary | ICD-10-CM | POA: Diagnosis not present

## 2018-12-02 DIAGNOSIS — M542 Cervicalgia: Secondary | ICD-10-CM | POA: Diagnosis not present

## 2018-12-09 DIAGNOSIS — M6240 Contracture of muscle, unspecified site: Secondary | ICD-10-CM | POA: Diagnosis not present

## 2018-12-09 DIAGNOSIS — M5136 Other intervertebral disc degeneration, lumbar region: Secondary | ICD-10-CM | POA: Diagnosis not present

## 2018-12-09 DIAGNOSIS — M9903 Segmental and somatic dysfunction of lumbar region: Secondary | ICD-10-CM | POA: Diagnosis not present

## 2018-12-09 DIAGNOSIS — M9901 Segmental and somatic dysfunction of cervical region: Secondary | ICD-10-CM | POA: Diagnosis not present

## 2018-12-09 DIAGNOSIS — M5126 Other intervertebral disc displacement, lumbar region: Secondary | ICD-10-CM | POA: Diagnosis not present

## 2018-12-09 DIAGNOSIS — M50323 Other cervical disc degeneration at C6-C7 level: Secondary | ICD-10-CM | POA: Diagnosis not present

## 2018-12-09 DIAGNOSIS — M50223 Other cervical disc displacement at C6-C7 level: Secondary | ICD-10-CM | POA: Diagnosis not present

## 2018-12-09 DIAGNOSIS — M791 Myalgia, unspecified site: Secondary | ICD-10-CM | POA: Diagnosis not present

## 2018-12-09 DIAGNOSIS — M546 Pain in thoracic spine: Secondary | ICD-10-CM | POA: Diagnosis not present

## 2018-12-14 ENCOUNTER — Ambulatory Visit: Payer: Medicare Other | Admitting: Cardiovascular Disease

## 2018-12-18 DIAGNOSIS — M50323 Other cervical disc degeneration at C6-C7 level: Secondary | ICD-10-CM | POA: Diagnosis not present

## 2018-12-18 DIAGNOSIS — M546 Pain in thoracic spine: Secondary | ICD-10-CM | POA: Diagnosis not present

## 2018-12-18 DIAGNOSIS — M50223 Other cervical disc displacement at C6-C7 level: Secondary | ICD-10-CM | POA: Diagnosis not present

## 2018-12-18 DIAGNOSIS — M9903 Segmental and somatic dysfunction of lumbar region: Secondary | ICD-10-CM | POA: Diagnosis not present

## 2018-12-18 DIAGNOSIS — M6240 Contracture of muscle, unspecified site: Secondary | ICD-10-CM | POA: Diagnosis not present

## 2018-12-18 DIAGNOSIS — M791 Myalgia, unspecified site: Secondary | ICD-10-CM | POA: Diagnosis not present

## 2018-12-18 DIAGNOSIS — M9901 Segmental and somatic dysfunction of cervical region: Secondary | ICD-10-CM | POA: Diagnosis not present

## 2018-12-18 DIAGNOSIS — M5126 Other intervertebral disc displacement, lumbar region: Secondary | ICD-10-CM | POA: Diagnosis not present

## 2018-12-18 DIAGNOSIS — M5136 Other intervertebral disc degeneration, lumbar region: Secondary | ICD-10-CM | POA: Diagnosis not present

## 2018-12-24 ENCOUNTER — Ambulatory Visit: Payer: Medicare Other | Admitting: Adult Health

## 2019-02-03 ENCOUNTER — Other Ambulatory Visit: Payer: Self-pay | Admitting: Cardiovascular Disease

## 2019-02-24 ENCOUNTER — Ambulatory Visit (INDEPENDENT_AMBULATORY_CARE_PROVIDER_SITE_OTHER): Payer: Medicare Other | Admitting: Adult Health

## 2019-02-24 ENCOUNTER — Other Ambulatory Visit: Payer: Self-pay

## 2019-02-24 ENCOUNTER — Encounter: Payer: Self-pay | Admitting: Adult Health

## 2019-02-24 DIAGNOSIS — J479 Bronchiectasis, uncomplicated: Secondary | ICD-10-CM | POA: Diagnosis not present

## 2019-02-24 DIAGNOSIS — G4733 Obstructive sleep apnea (adult) (pediatric): Secondary | ICD-10-CM

## 2019-02-24 DIAGNOSIS — I2510 Atherosclerotic heart disease of native coronary artery without angina pectoris: Secondary | ICD-10-CM | POA: Diagnosis not present

## 2019-02-24 DIAGNOSIS — J849 Interstitial pulmonary disease, unspecified: Secondary | ICD-10-CM

## 2019-02-24 DIAGNOSIS — J309 Allergic rhinitis, unspecified: Secondary | ICD-10-CM

## 2019-02-24 NOTE — Assessment & Plan Note (Addendum)
  Patient education given.  Patient will restart CPAP.   Trial of a DreamWear nasal mask was given We will have him return in 2 months with a CPAP download  Plan  Patient Instructions  Restart CPAP At bedtime   Try to wear each night for at least 4-6 hrs .  Work on healthy weight loss.  Do not drive if sleepy .  Order for SD card for machine  Trial of dream wear nasal mask .  Use Symbicort 2 puffs Twice daily  If your cough flares, rinse after use.  Follow up Dr. Melvyn Novas  Or Kole Hilyard in 2-3 months  CPAP download on return, bring your CPAP SD card.

## 2019-02-24 NOTE — Assessment & Plan Note (Signed)
Mild changes noted on CT chest 2018.  No active symptoms of cough shortness of breath or activity intolerance. We will continue to monitor.  Consider PFTs going forward with a DLCO

## 2019-02-24 NOTE — Progress Notes (Signed)
@Patient  ID: [name], male    DOB: [birth year], 78 y.o.   MRN: [id number]  Chief Complaint  Patient presents with  . Follow-up    Asthma     Referring provider: [name], MD  HPI: 78 year old male never smoker with ankylosing spondylitis followed for chronic cough /cough variant asthma  , Mild bronchiectasis and possible NSIP on HRCT chest   TEST  - FENO 07/15/2016 = 42 on sym 160 2bid  - Sinus CT 07/31/2016>>>neg - FENO 10/10/2016= 12 during flare while on symb 80 2bid - PFT's 3/8/2018FEV1 2.40 (89 % ) ratio 87 p 0 % improvement from saba p symb 80 x 2 prior to study with DLCO 60/58 % corrects to 90 % for alv volume and erv =50%  - Allergy profile 10/17/2016>Eos 0.0 / IgE 55 Rast pos dust only  - HRCT chest 11/2016 -NSIP Changes/Bronchiectasis noted  - 5/3/2018spirometry s obst with active symptoms >>trial off symbicort as not convinced helping - FENO 02/18/2018= 11 during flare p am prednisone - Spirometry 7/10/2019No obst on no rx  SNIFF test 11/30/15 >> elevated Rt diaphragm, but has b/l diaphragm movement HST 12/21/15 >> AHI 44.5, SpO2 low 64% Auto CPAP 03/02/16 to 03/31/16 >> used on 26 of 30 nights with average 6 hrs 32 min.  Average AHI 5.5 with median CPAP 8 and 95 th percentile CPAP 11 cm H2O ONO with CPAP 04/10/16 >>test time 6 hrs 30 min. Mean SpO2 93.51%, low SpO2 77%. Spent 7 min with SpO2 <88%   10/16/15 Doppler legs b/l >> no DVT 11/30/15 Echo >> EF 65 to 68%, grade 1 diastolic CHF   08/26/7260 Follow up : Chronic Cough /Asthma /Bronchiectasis  Patient returns for a 13-month follow-up.  Patient says overall he feels he is doing very well. No significant cough. No dyspnea. Remains active.  Uses Symbicort As needed  , has not used for a while. 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 Just built house , and closing on it today, Very excited.  Previously has had difficulty with intermittent asthma, chronic cough.  HRCT chest in 2018 showed   Mild patchy areas of groundglass attenuation and septal thickening scattered through the lungs bilaterally.  Most evident in the periphery.  Some peripheral bronchiectasis.  Suspicious for possible NSIP.  Patient says he does not feel that his breathing is an issue.  And has no limitations in activity tolerance.  PFTs in 2018 showed no airflow obstruction with DLCO at 60%  Has severe OSA says he has been intolerant to CPAP.  Says that the mask is not very comfortable.  Has not worn his CPAP in a greater than a year.   [facility].  We discussed the importance of treating underlying sleep apnea and the potential complications of untreated sleep apnea.  We discussed options with CPAP.  Patient is willing to try this again.  We gave him a sample of a DreamWear nasal mask to try this to see if he would be more comfortable.  Also send an order to [facility] to provide an CPAP SD card.   Does have some daytime sleepiness..  Allergies  Allergen Reactions  . Tramadol Other (See Comments)    Makes patient jumpy    Immunization History  Administered Date(s) Administered  . Influenza Split 06/01/2012  . Influenza Whole 05/30/2009, 04/20/2010  . Influenza, High Dose Seasonal PF 05/19/2017, 05/19/2018  . Influenza-Unspecified 07/12/2013, 05/10/2016  . Pneumococcal  Conjugate-13 05/19/2018  . Pneumococcal Polysaccharide-23 08/22/2009  . Tdap 12/04/2010, 06/04/2011, 07/31/2018  . Zoster 05/17/2008    Past Medical History:  Diagnosis Date  . Abnormal chest x-ray   . Allergic rhinitis   . Ankylosing spondylitis ([place])   . Anxiety   . Arthritis   . Colonic polyp   . Coronary atherosclerosis of native coronary artery   . Cough   . Dizziness 10/21/2016  . Ganglion cyst of wrist    left  . Heart attack ([place]) 2008  . Hemorrhoids   . History of malaria   . Hyperlipidemia    with low HDL  . Hypertension   . Kidney stone   . Other disorder of  muscle, ligament, and fascia   . Rotator cuff tear   . Swelling of lower extremity 10/16/2015    Tobacco History: Social History   Tobacco Use  Smoking Status Never Smoker  Smokeless Tobacco Never Used   Counseling given: Not Answered   Outpatient Medications Prior to Visit  Medication Sig Dispense Refill  . amLODipine (NORVASC) 5 MG tablet TAKE 1 TABLET EVERY DAY 90 tablet 1  . Apoaequorin (PREVAGEN PO) Take 1 tablet by mouth daily.     [name] aspirin 81 MG EC tablet Take 81 mg by mouth daily.     . budesonide-formoterol (SYMBICORT) 80-4.5 MCG/ACT inhaler Inhale 2 puffs into the lungs as needed.    . chlorpheniramine (CHLOR-TRIMETON) 4 MG tablet Take 4 mg by mouth 2 (two) times daily as needed for allergies.    [name] dextromethorphan-guaiFENesin (MUCINEX DM) 30-600 MG 12hr tablet Take 2 tablets by mouth 2 (two) times daily as needed for cough.    . famotidine (PEPCID) 20 MG tablet Take 20 mg by mouth at bedtime.    . Multiple Vitamins-Minerals (ICAPS AREDS 2) CAPS Take by mouth daily.    . pantoprazole (PROTONIX) 40 MG tablet TAKE 1 TABLET BY MOUTH ONCE DAILY 30  TO  60  MINUTES  BEFORE  FIRST  MEAL  OF  THE  DAY 30 tablet 5  . Respiratory Therapy Supplies (FLUTTER) DEVI by Does not apply route as directed.    . simvastatin (ZOCOR) 20 MG tablet TAKE 1 TABLET EVERY DAY WITH BREAKFAST 90 tablet 1  . UNABLE TO FIND Med Name: CPAP per Dr Halford Chessman     No facility-administered medications prior to visit.      Review of Systems:   Constitutional:   No  weight loss, night sweats,  Fevers, chills, fatigue, or  lassitude.  HEENT:   No headaches,  Difficulty swallowing,  Tooth/dental problems, or  Sore throat,                No sneezing, itching, ear ache, nasal congestion, post nasal drip,   CV:  No chest pain,  Orthopnea, PND, swelling in lower extremities, anasarca, dizziness, palpitations, syncope.   GI  No heartburn, indigestion, abdominal pain, nausea, vomiting, diarrhea, change in bowel  habits, loss of appetite, bloody stools.   Resp: No shortness of breath with exertion or at rest.  No excess mucus, no productive cough,  No non-productive cough,  No coughing up of blood.  No change in color of mucus.  No wheezing.  No chest wall deformity  Skin: no rash or lesions.  GU: no dysuria, change in color of urine, no urgency or frequency.  No flank pain, no hematuria   MS:  No joint pain or swelling.  No decreased range of motion.  No back pain.    Physical Exam  BP 116/68   Pulse 61   Temp 98.2 F (36.8 C) (Oral)   Ht 5\' 7"  (1.702 m)   Wt 183 lb 6.4 oz (83.2 kg)   SpO2 99%   BMI 28.72 kg/m   GEN: A/Ox3; pleasant , NAD, elderly    HEENT:  [place]/AT,    NOSE-clear, THROAT-clear, no lesions, no postnasal drip or exudate noted.  Class 2 MP aiway with elongated uvula.   NECK:  Supple w/ fair ROM; no JVD; normal carotid impulses w/o bruits; no thyromegaly or nodules palpated; no lymphadenopathy.    RESP  Clear  P & A; w/o, wheezes/ rales/ or rhonchi. no accessory muscle use, no dullness to percussion  CARD:  RRR, no m/r/g, no peripheral edema, pulses intact, no cyanosis or clubbing.  GI:   Soft & nt; nml bowel sounds; no organomegaly or masses detected.   Musco: Warm bil, no deformities or joint swelling noted.   Neuro: alert, no focal deficits noted.    Skin: Warm, no lesions or rashes    Lab Results:  CBC    Component Value Date/Time   WBC 6.7 10/22/2018 1013   WBC 5.2 12/05/2017 0617   RBC 5.06 10/22/2018 1013   HGB 15.0 10/22/2018 1013   HGB 15.5 04/21/2017 0823   HCT 46.2 10/22/2018 1013   HCT 45.4 04/21/2017 0823   PLT 178 10/22/2018 1013   PLT 79 Few Large & Occ giant platelets (L) 04/21/2017 0823   MCV 91.3 10/22/2018 1013   MCV 88.7 05/19/2017 1204   MCV 91.2 04/21/2017 0823   MCH 29.6 10/22/2018 1013   MCHC 32.5 10/22/2018 1013   RDW 15.0 10/22/2018 1013   RDW 14.6 04/21/2017 0823   LYMPHSABS 1.3 10/22/2018 1013   LYMPHSABS 1.4 04/21/2017  0823   MONOABS 0.6 10/22/2018 1013   MONOABS 0.6 04/21/2017 0823   EOSABS 0.2 10/22/2018 1013   EOSABS 0.0 04/21/2017 0823   BASOSABS 0.0 10/22/2018 1013   BASOSABS 0.0 04/21/2017 0823    BMET    Component Value Date/Time   NA 145 (H) 10/08/2018 1125   NA 141 04/21/2017 0823   K 4.2 10/08/2018 1125   K 4.0 04/21/2017 0823   CL 103 10/08/2018 1125   CL 106 09/30/2012 0949   CO2 25 10/08/2018 1125   CO2 25 04/21/2017 0823   GLUCOSE 107 (H) 10/08/2018 1125   GLUCOSE 134 (H) 12/05/2017 0617   GLUCOSE 126 04/21/2017 0823   GLUCOSE 198 (H) 09/30/2012 0949   BUN 19 10/08/2018 1125   BUN 20.2 04/21/2017 0823   CREATININE 0.92 10/08/2018 1125   CREATININE 0.9 04/21/2017 0823   CALCIUM 9.7 10/08/2018 1125   CALCIUM 9.6 04/21/2017 0823   GFRNONAA 81 10/08/2018 1125   GFRNONAA 84 11/28/2015 1008   GFRAA 94 10/08/2018 1125   GFRAA >89 11/28/2015 1008    BNP    Component Value Date/Time   BNP 17.2 11/28/2015 1008    ProBNP    Component Value Date/Time   PROBNP 53.0 09/12/2006 1303    Imaging: No results found.    PFT Results Latest Ref Rng & Units 10/17/2016  FVC-Pre L 2.73  FVC-Predicted Pre % 73  FVC-Post L 2.76  FVC-Predicted Post % 74  Pre FEV1/FVC % % 88  Post FEV1/FCV % % 87  FEV1-Pre L 2.39  FEV1-Predicted Pre % 89  FEV1-Post L 2.40  DLCO UNC% % 60  DLCO COR %Predicted %  90  TLC L 4.37  TLC % Predicted % 69  RV % Predicted % 59    Lab Results  Component Value Date   NITRICOXIDE 11 02/18/2018        Assessment & Plan:   Allergic rhinitis Appears to be under good control  continue on current regimen trigger prevention  ILD (interstitial lung disease) (HCC) Mild changes noted on CT chest 2018.  No active symptoms of cough shortness of breath or activity intolerance. We will continue to monitor.  Consider PFTs going forward with a DLCO  Bronchiectasis without complication (HCC) No active symptoms.  Continue to monitor  Obstructive sleep  apnea   Patient education given.  Patient will restart CPAP.   Trial of a DreamWear nasal mask was given We will have him return in 2 months with a CPAP download  Plan  Patient Instructions  Restart CPAP At bedtime   Try to wear each night for at least 4-6 hrs .  Work on healthy weight loss.  Do not drive if sleepy .  Order for SD card for machine  Trial of dream wear nasal mask .  Use Symbicort 2 puffs Twice daily  If your cough flares, rinse after use.  Follow up Dr. Melvyn Novas  Or Charizma Gardiner in 2-3 months  CPAP download on return, bring your CPAP SD card.            Rexene Edison, NP 02/24/2019

## 2019-02-24 NOTE — Addendum Note (Signed)
Addended by: Valerie Salts on: 02/24/2019 11:58 AM   Modules accepted: Orders

## 2019-02-24 NOTE — Assessment & Plan Note (Signed)
No active symptoms.  Continue to monitor

## 2019-02-24 NOTE — Patient Instructions (Signed)
Restart CPAP At bedtime   Try to wear each night for at least 4-6 hrs .  Work on healthy weight loss.  Do not drive if sleepy .  Order for SD card for machine  Trial of dream wear nasal mask .  Use Symbicort 2 puffs Twice daily  If your cough flares, rinse after use.  Follow up Dr. Melvyn Novas  Or Matson Welch in 2-3 months  CPAP download on return, bring your CPAP SD card.

## 2019-02-24 NOTE — Assessment & Plan Note (Signed)
Appears to be under good control  continue on current regimen trigger prevention

## 2019-02-26 NOTE — Progress Notes (Signed)
Chart and office note reviewed in detail  > agree with a/p as outlined    

## 2019-03-15 DIAGNOSIS — D485 Neoplasm of uncertain behavior of skin: Secondary | ICD-10-CM | POA: Diagnosis not present

## 2019-03-15 DIAGNOSIS — L723 Sebaceous cyst: Secondary | ICD-10-CM | POA: Diagnosis not present

## 2019-04-23 ENCOUNTER — Telehealth: Payer: Self-pay | Admitting: Internal Medicine

## 2019-04-23 NOTE — Telephone Encounter (Signed)
Returned patient's phone call regarding 09/14 appointments, per patient's request appointment has been moved to 09/16.

## 2019-04-26 ENCOUNTER — Other Ambulatory Visit: Payer: Medicare Other

## 2019-04-26 DIAGNOSIS — L570 Actinic keratosis: Secondary | ICD-10-CM | POA: Diagnosis not present

## 2019-04-26 DIAGNOSIS — L298 Other pruritus: Secondary | ICD-10-CM | POA: Diagnosis not present

## 2019-04-26 DIAGNOSIS — D229 Melanocytic nevi, unspecified: Secondary | ICD-10-CM | POA: Diagnosis not present

## 2019-04-26 DIAGNOSIS — L821 Other seborrheic keratosis: Secondary | ICD-10-CM | POA: Diagnosis not present

## 2019-04-28 ENCOUNTER — Inpatient Hospital Stay: Payer: Medicare Other

## 2019-04-28 ENCOUNTER — Encounter: Payer: Self-pay | Admitting: Internal Medicine

## 2019-04-28 ENCOUNTER — Encounter: Payer: Self-pay | Admitting: Adult Health

## 2019-04-28 ENCOUNTER — Inpatient Hospital Stay: Payer: Medicare Other | Attending: Internal Medicine | Admitting: Internal Medicine

## 2019-04-28 ENCOUNTER — Ambulatory Visit (INDEPENDENT_AMBULATORY_CARE_PROVIDER_SITE_OTHER): Payer: Medicare Other | Admitting: Adult Health

## 2019-04-28 ENCOUNTER — Other Ambulatory Visit: Payer: Self-pay

## 2019-04-28 ENCOUNTER — Telehealth: Payer: Self-pay | Admitting: Internal Medicine

## 2019-04-28 VITALS — BP 144/70 | HR 64 | Temp 98.0°F | Resp 16 | Ht 67.0 in | Wt 185.3 lb

## 2019-04-28 DIAGNOSIS — D693 Immune thrombocytopenic purpura: Secondary | ICD-10-CM | POA: Insufficient documentation

## 2019-04-28 DIAGNOSIS — D696 Thrombocytopenia, unspecified: Secondary | ICD-10-CM | POA: Diagnosis not present

## 2019-04-28 DIAGNOSIS — J479 Bronchiectasis, uncomplicated: Secondary | ICD-10-CM | POA: Diagnosis not present

## 2019-04-28 DIAGNOSIS — Z79899 Other long term (current) drug therapy: Secondary | ICD-10-CM | POA: Diagnosis not present

## 2019-04-28 DIAGNOSIS — E785 Hyperlipidemia, unspecified: Secondary | ICD-10-CM | POA: Diagnosis not present

## 2019-04-28 DIAGNOSIS — G4733 Obstructive sleep apnea (adult) (pediatric): Secondary | ICD-10-CM

## 2019-04-28 DIAGNOSIS — Z7951 Long term (current) use of inhaled steroids: Secondary | ICD-10-CM | POA: Insufficient documentation

## 2019-04-28 DIAGNOSIS — I2510 Atherosclerotic heart disease of native coronary artery without angina pectoris: Secondary | ICD-10-CM

## 2019-04-28 DIAGNOSIS — J45991 Cough variant asthma: Secondary | ICD-10-CM

## 2019-04-28 DIAGNOSIS — I10 Essential (primary) hypertension: Secondary | ICD-10-CM | POA: Insufficient documentation

## 2019-04-28 DIAGNOSIS — I252 Old myocardial infarction: Secondary | ICD-10-CM | POA: Diagnosis not present

## 2019-04-28 DIAGNOSIS — Z955 Presence of coronary angioplasty implant and graft: Secondary | ICD-10-CM | POA: Diagnosis not present

## 2019-04-28 DIAGNOSIS — Z7982 Long term (current) use of aspirin: Secondary | ICD-10-CM | POA: Diagnosis not present

## 2019-04-28 LAB — CBC WITH DIFFERENTIAL (CANCER CENTER ONLY)
Abs Immature Granulocytes: 0 10*3/uL (ref 0.00–0.07)
Basophils Absolute: 0 10*3/uL (ref 0.0–0.1)
Basophils Relative: 0 %
Eosinophils Absolute: 0 10*3/uL (ref 0.0–0.5)
Eosinophils Relative: 1 %
HCT: 45.4 % (ref 39.0–52.0)
Hemoglobin: 15.1 g/dL (ref 13.0–17.0)
Immature Granulocytes: 0 %
Lymphocytes Relative: 20 %
Lymphs Abs: 1.2 10*3/uL (ref 0.7–4.0)
MCH: 30.3 pg (ref 26.0–34.0)
MCHC: 33.3 g/dL (ref 30.0–36.0)
MCV: 91 fL (ref 80.0–100.0)
Monocytes Absolute: 0.9 10*3/uL (ref 0.1–1.0)
Monocytes Relative: 14 %
Neutro Abs: 3.9 10*3/uL (ref 1.7–7.7)
Neutrophils Relative %: 65 %
Platelet Count: 168 10*3/uL (ref 150–400)
RBC: 4.99 MIL/uL (ref 4.22–5.81)
RDW: 15.1 % (ref 11.5–15.5)
WBC Count: 6 10*3/uL (ref 4.0–10.5)
nRBC: 0 % (ref 0.0–0.2)

## 2019-04-28 NOTE — Progress Notes (Signed)
@Patient  ID: [name], male    DOB: 1940-12-13, 78 y.o.   MRN: [id number]  Chief Complaint  Patient presents with  . Follow-up    OSA     Referring provider: [name], MD  HPI: 78 year old male never smoker with ankylosing spondylitis followed forchronic cough/cough variant asthma, Mildbronchiectasisand possible NSIP on HRCT chest , Severe OSA   TEST/EVENTS  - FENO 07/15/2016 = 42 on sym 160 2bid  - Sinus CT 07/31/2016>>>neg - FENO 10/10/2016= 12 during flare while on symb 80 2bid - PFT's 3/8/2018FEV1 2.40 (89 % ) ratio 87 p 0 % improvement from saba p symb 80 x 2 prior to study with DLCO 60/58 % corrects to 90 % for alv volume and erv =50%  - Allergy profile 10/17/2016>Eos 0.0 / IgE 55 Rast pos dust only  - HRCT chest 11/2016 -NSIP Changes/Bronchiectasis noted  - 5/3/2018spirometry s obst with active symptoms >>trial off symbicort as not convinced helping - FENO 02/18/2018= 11 during flare p am prednisone - Spirometry 7/10/2019No obst on no rx  SNIFF test 11/30/15 >> elevated Rt diaphragm, but has b/l diaphragm movement HST 12/21/15 >> AHI 44.5, SpO2 low 64% Auto CPAP 03/02/16 to 03/31/16 >>used on 26 of 30 nights with average 6 hrs 32 min. Average AHI 5.5 with median CPAP 8 and 95 th percentile CPAP 11 cm H2O ONO with CPAP 04/10/16 >>test time 6 hrs 30 min. Mean SpO2 93.51%, low SpO2 77%. Spent 7 min with SpO2 <88%   10/16/15 Doppler legs b/l >> no DVT 11/30/15 Echo >> EF 65 to [id number], grade 1 diastolic CHF  [id number] Follow up : Chronic Cough , Asthma, Bronchiectasis , OSA  Patient returns for a 72-month follow-up.  Patient has underlying severe sleep apnea on previous sleep study in 2017.  He has recently restarted CPAP.  He was given a new DreamWear nasal mask last visit.  Says it is working better.  Is trying to wear his CPAP more often.  He is unsure if he sees any significant changes in daytime symptoms.  But is wanting to be  healthier.  He denies any significant daytime sleepiness.  CPAP download shows 67% compliance with daily average usage at 6.5 hours.  Patient is on auto CPAP 5 to 20 cm H2O.  AHI 3.9.  In the past patient has had some chronic cough intermittent asthma and notable bronchiectasis on CT scan.  High-resolution CT chest April 2018 showed possible NSIP changes and bronchiectasis.  Patient says he has actually been doing very well with his breathing with no significant cough recently.  He denies any wheezing or shortness of breath.  Says he is very active.  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  He does have Symbicort which he uses on occasion whenever he gets up respiratory infection.  Says he has not used it in a long time.  Has no albuterol use.   Allergies  Allergen Reactions  . Tramadol Other (See Comments)    Makes patient jumpy    Immunization History  Administered Date(s) Administered  . Influenza Split 06/01/2012  . Influenza Whole 05/30/2009, 04/20/2010  . Influenza, High Dose Seasonal PF 05/19/2017, 05/19/2018  . Influenza-Unspecified 07/12/2013, 05/10/2016  . Pneumococcal Conjugate-13 05/19/2018  . Pneumococcal Polysaccharide-23 08/22/2009  . Tdap 12/04/2010, 06/04/2011, 07/31/2018  . Zoster 05/17/2008    Past Medical History:  Diagnosis Date  . Abnormal chest x-ray   . Allergic rhinitis   . Ankylosing spondylitis ([place])   .  Anxiety   . Arthritis   . Colonic polyp   . Coronary atherosclerosis of native coronary artery   . Cough   . Dizziness 10/21/2016  . Ganglion cyst of wrist    left  . Heart attack ([place]) 2008  . Hemorrhoids   . History of malaria   . Hyperlipidemia    with low HDL  . Hypertension   . Kidney stone   . Other disorder of muscle, ligament, and fascia   . Rotator cuff tear   . Swelling of lower extremity 10/16/2015    Tobacco History: Social History   Tobacco Use  Smoking Status Never Smoker  Smokeless Tobacco Never Used    Counseling given: Not Answered   Outpatient Medications Prior to Visit  Medication Sig Dispense Refill  . amLODipine (NORVASC) 5 MG tablet TAKE 1 TABLET EVERY DAY 90 tablet 1  . Apoaequorin (PREVAGEN PO) Take 1 tablet by mouth daily.     [name] aspirin 81 MG EC tablet Take 81 mg by mouth daily.     . budesonide-formoterol (SYMBICORT) 80-4.5 MCG/ACT inhaler Inhale 2 puffs into the lungs as needed.    . chlorpheniramine (CHLOR-TRIMETON) 4 MG tablet Take 4 mg by mouth 2 (two) times daily as needed for allergies.    [name] dextromethorphan-guaiFENesin (MUCINEX DM) 30-600 MG 12hr tablet Take 2 tablets by mouth 2 (two) times daily as needed for cough.    . famotidine (PEPCID) 20 MG tablet Take 20 mg by mouth at bedtime.    . Multiple Vitamins-Minerals (ICAPS AREDS 2) CAPS Take by mouth daily.    . pantoprazole (PROTONIX) 40 MG tablet TAKE 1 TABLET BY MOUTH ONCE DAILY 30  TO  60  MINUTES  BEFORE  FIRST  MEAL  OF  THE  DAY 30 tablet 5  . Respiratory Therapy Supplies (FLUTTER) DEVI by Does not apply route as directed.    . simvastatin (ZOCOR) 20 MG tablet TAKE 1 TABLET EVERY DAY WITH BREAKFAST 90 tablet 1  . UNABLE TO FIND Med Name: CPAP per Dr Halford Chessman    . triamcinolone cream (KENALOG) 0.1 % Apply 1 application topically as needed.     No facility-administered medications prior to visit.      Review of Systems:   Constitutional:   No  weight loss, night sweats,  Fevers, chills, fatigue, or  lassitude.  HEENT:   No headaches,  Difficulty swallowing,  Tooth/dental problems, or  Sore throat,                No sneezing, itching, ear ache, nasal congestion, post nasal drip,   CV:  No chest pain,  Orthopnea, PND, swelling in lower extremities, anasarca, dizziness, palpitations, syncope.   GI  No heartburn, indigestion, abdominal pain, nausea, vomiting, diarrhea, change in bowel habits, loss of appetite, bloody stools.   Resp:   No excess mucus, no productive cough,  No non-productive cough,  No coughing up  of blood.  No change in color of mucus.  No wheezing.  No chest wall deformity  Skin: no rash or lesions.  GU: no dysuria, change in color of urine, no urgency or frequency.  No flank pain, no hematuria   MS:  No joint pain or swelling.  No decreased range of motion.  No back pain.    Physical Exam  BP 122/80 (BP Location: Left Arm, Cuff Size: Normal)   Pulse 60   Ht 5\' 7"  (1.702 m)   Wt 187 lb (84.8 kg)  SpO2 96%   BMI 29.29 kg/m   GEN: A/Ox3; pleasant , NAD, elderly   HEENT:  [place]/AT,  NOSE-clear, THROAT-clear, no lesions, no postnasal drip or exudate noted.  Class II MP airway  NECK:  Supple w/ fair ROM; no JVD; normal carotid impulses w/o bruits; no thyromegaly or nodules palpated; no lymphadenopathy.    RESP  Clear  P & A; w/o, wheezes/ rales/ or rhonchi. no accessory muscle use, no dullness to percussion  CARD:  RRR, no m/r/g, no peripheral edema, pulses intact, no cyanosis or clubbing.  GI:   Soft & nt; nml bowel sounds; no organomegaly or masses detected.   Musco: Warm bil, no deformities or joint swelling noted.   Neuro: alert, no focal deficits noted.    Skin: Warm, no lesions or rashes    Lab Results:  CBC    Component Value Date/Time   WBC 6.7 10/22/2018 1013   WBC 5.2 12/05/2017 0617   RBC 5.06 10/22/2018 1013   HGB 15.0 10/22/2018 1013   HGB 15.5 04/21/2017 0823   HCT 46.2 10/22/2018 1013   HCT 45.4 04/21/2017 0823   PLT 178 10/22/2018 1013   PLT 79 Few Large & Occ giant platelets (L) 04/21/2017 0823   MCV 91.3 10/22/2018 1013   MCV 88.7 05/19/2017 1204   MCV 91.2 04/21/2017 0823   MCH 29.6 10/22/2018 1013   MCHC 32.5 10/22/2018 1013   RDW 15.0 10/22/2018 1013   RDW 14.6 04/21/2017 0823   LYMPHSABS 1.3 10/22/2018 1013   LYMPHSABS 1.4 04/21/2017 0823   MONOABS 0.6 10/22/2018 1013   MONOABS 0.6 04/21/2017 0823   EOSABS 0.2 10/22/2018 1013   EOSABS 0.0 04/21/2017 0823   BASOSABS 0.0 10/22/2018 1013   BASOSABS 0.0 04/21/2017 0823    BMET     Component Value Date/Time   NA 145 (H) 10/08/2018 1125   NA 141 04/21/2017 0823   K 4.2 10/08/2018 1125   K 4.0 04/21/2017 0823   CL 103 10/08/2018 1125   CL 106 09/30/2012 0949   CO2 25 10/08/2018 1125   CO2 25 04/21/2017 0823   GLUCOSE 107 (H) 10/08/2018 1125   GLUCOSE 134 (H) 12/05/2017 0617   GLUCOSE 126 04/21/2017 0823   GLUCOSE 198 (H) 09/30/2012 0949   BUN 19 10/08/2018 1125   BUN 20.2 04/21/2017 0823   CREATININE 0.92 10/08/2018 1125   CREATININE 0.9 04/21/2017 0823   CALCIUM 9.7 10/08/2018 1125   CALCIUM 9.6 04/21/2017 0823   GFRNONAA 81 10/08/2018 1125   GFRNONAA 84 11/28/2015 1008   GFRAA 94 10/08/2018 1125   GFRAA >89 11/28/2015 1008    BNP    Component Value Date/Time   BNP 17.2 11/28/2015 1008    ProBNP    Component Value Date/Time   PROBNP 53.0 09/12/2006 1303    Imaging: No results found.    PFT Results Latest Ref Rng & Units 10/17/2016  FVC-Pre L 2.73  FVC-Predicted Pre % 73  FVC-Post L 2.76  FVC-Predicted Post % 74  Pre FEV1/FVC % % 88  Post FEV1/FCV % % 87  FEV1-Pre L 2.39  FEV1-Predicted Pre % 89  FEV1-Post L 2.40  DLCO UNC% % 60  DLCO COR %Predicted % 90  TLC L 4.37  TLC % Predicted % 69  RV % Predicted % 59    Lab Results  Component Value Date   NITRICOXIDE 11 02/18/2018        Assessment & Plan:   Obstructive sleep apnea Severe obstructive  sleep apnea with improved compliance.  Patient has excellent control on CPAP.  Encouraged on CPAP usage.  Patient education given.  Plan  Patient Instructions  Continue on CPAP At bedtime   Try to wear each night for at least 4-6 hrs .  Work on healthy weight loss.  Do not drive if sleepy .  Use Symbicort 2 puffs Twice daily  If your cough flares, rinse after use.  Follow up Dr. Melvyn Novas  Or Rocky Rishel in 3-4 months with PFT .  Get Flu shot as discussed.      Bronchiectasis without complication ([place]) Controlled without flare. Continue to follow. Check PFTs on return  Cough  variant asthma  vs UACS Doing well on current regimen.  Check PFTs on return.     Rexene Edison, NP 04/28/2019

## 2019-04-28 NOTE — Assessment & Plan Note (Signed)
Severe obstructive sleep apnea with improved compliance.  Patient has excellent control on CPAP.  Encouraged on CPAP usage.  Patient education given.  Plan  Patient Instructions  Continue on CPAP At bedtime   Try to wear each night for at least 4-6 hrs .  Work on healthy weight loss.  Do not drive if sleepy .  Use Symbicort 2 puffs Twice daily  If your cough flares, rinse after use.  Follow up Dr. Melvyn Novas  Or Erleen Egner in 3-4 months with PFT .  Get Flu shot as discussed.

## 2019-04-28 NOTE — Telephone Encounter (Signed)
Scheduled appt per 9/16 los - mailed letter with appt date and time   

## 2019-04-28 NOTE — Assessment & Plan Note (Signed)
Doing well on current regimen.  Check PFTs on return.

## 2019-04-28 NOTE — Patient Instructions (Signed)
Continue on CPAP At bedtime   Try to wear each night for at least 4-6 hrs .  Work on healthy weight loss.  Do not drive if sleepy .  Use Symbicort 2 puffs Twice daily  If your cough flares, rinse after use.  Follow up Dr. Melvyn Novas  Or Tejuan Gholson in 3-4 months with PFT .  Get Flu shot as discussed.

## 2019-04-28 NOTE — Assessment & Plan Note (Signed)
Controlled without flare. Continue to follow. Check PFTs on return

## 2019-04-28 NOTE — Progress Notes (Signed)
[place] [phone]   [phone]  OFFICE PROGRESS NOTE  [name], MD [place] [id number]  DIAGNOSIS: Thrombocytopenia, most likely idiopathic thrombocytopenic purpura.  PRIOR THERAPY:None.  CURRENT THERAPY: observation.  INTERVAL HISTORY: [name] 78 y.o. male returns to the clinic today for follow-up visit.  The patient is feeling fine today with no concerning complaints.  He denied having any chest pain, shortness of breath, cough or hemoptysis.  He denied having any fever or chills.  He has no nausea, vomiting, diarrhea or constipation.  The patient denied having any bleeding, bruises or ecchymosis.  He is here today for evaluation and repeat CBC.  MEDICAL HISTORY: Past Medical History:  Diagnosis Date  . Abnormal chest x-ray   . Allergic rhinitis   . Ankylosing spondylitis ([place])   . Anxiety   . Arthritis   . Colonic polyp   . Coronary atherosclerosis of native coronary artery   . Cough   . Dizziness 10/21/2016  . Ganglion cyst of wrist    left  . Heart attack ([place]) 2008  . Hemorrhoids   . History of malaria   . Hyperlipidemia    with low HDL  . Hypertension   . Kidney stone   . Other disorder of muscle, ligament, and fascia   . Rotator cuff tear   . Swelling of lower extremity 10/16/2015    ALLERGIES:  is allergic to tramadol.  MEDICATIONS:  Current Outpatient Medications  Medication Sig Dispense Refill  . amLODipine (NORVASC) 5 MG tablet TAKE 1 TABLET EVERY DAY 90 tablet 1  . Apoaequorin (PREVAGEN PO) Take 1 tablet by mouth daily.     [name] aspirin 81 MG EC tablet Take 81 mg by mouth daily.     . budesonide-formoterol (SYMBICORT) 80-4.5 MCG/ACT inhaler Inhale 2 puffs into the lungs as needed.    . chlorpheniramine (CHLOR-TRIMETON) 4 MG tablet Take 4 mg by mouth 2 (two) times daily as needed for allergies.    [name] dextromethorphan-guaiFENesin (MUCINEX DM) 30-600 MG 12hr tablet Take 2 tablets  by mouth 2 (two) times daily as needed for cough.    . famotidine (PEPCID) 20 MG tablet Take 20 mg by mouth at bedtime.    . Multiple Vitamins-Minerals (ICAPS AREDS 2) CAPS Take by mouth daily.    . pantoprazole (PROTONIX) 40 MG tablet TAKE 1 TABLET BY MOUTH ONCE DAILY 30  TO  60  MINUTES  BEFORE  FIRST  MEAL  OF  THE  DAY 30 tablet 5  . Respiratory Therapy Supplies (FLUTTER) DEVI by Does not apply route as directed.    . simvastatin (ZOCOR) 20 MG tablet TAKE 1 TABLET EVERY DAY WITH BREAKFAST 90 tablet 1  . triamcinolone cream (KENALOG) 0.1 % Apply 1 application topically as needed.    [name] UNABLE TO FIND Med Name: CPAP per Dr Halford Chessman     No current facility-administered medications for this visit.     SURGICAL HISTORY:  Past Surgical History:  Procedure Laterality Date  . CORONARY ANGIOPLASTY WITH STENT PLACEMENT    . HEMORRHOID SURGERY  10/10   in [place]  . LEFT HEART CATH AND CORONARY ANGIOGRAPHY N/A 01/14/2017   Procedure: Left Heart Cath and Coronary Angiography;  Surgeon: [name], MD;  Location: [facility];  Service: Cardiovascular;  Laterality: N/A;  . LEFT HEART CATHETERIZATION WITH CORONARY ANGIOGRAM N/A 09/03/2012   Procedure: LEFT HEART CATHETERIZATION WITH CORONARY ANGIOGRAM;  Surgeon:  [name], MD;  Location: [facility] CATH LAB;  Service: Cardiovascular;  Laterality: N/A;  . ROTATOR CUFF REPAIR     bilat by Dr. French Ana    REVIEW OF SYSTEMS:  A comprehensive review of systems was negative.   PHYSICAL EXAMINATION: General appearance: alert, cooperative and no distress Head: Normocephalic, without obvious abnormality, atraumatic Neck: no adenopathy Lymph nodes: Cervical, supraclavicular, and axillary nodes normal. Resp: clear to auscultation bilaterally Back: symmetric, no curvature. ROM normal. No CVA tenderness. Cardio: regular rate and rhythm, S1, S2 normal, no murmur, click, rub or gallop GI: soft, non-tender; bowel sounds normal; no masses,  no organomegaly  Extremities: extremities normal, atraumatic, no cyanosis or edema  ECOG PERFORMANCE STATUS: 0 - Asymptomatic  Blood pressure (!) 144/70, pulse 64, temperature 98 F (36.7 C), temperature source Temporal, resp. rate 16, height 5\' 7"  (1.702 m), weight 185 lb 4.8 oz (84.1 kg), SpO2 97 %.  LABORATORY DATA: Lab Results  Component Value Date   WBC 6.0 04/28/2019   HGB 15.1 04/28/2019   HCT 45.4 04/28/2019   MCV 91.0 04/28/2019   PLT 168 04/28/2019      Chemistry      Component Value Date/Time   NA 145 (H) 10/08/2018 1125   NA 141 04/21/2017 0823   K 4.2 10/08/2018 1125   K 4.0 04/21/2017 0823   CL 103 10/08/2018 1125   CL 106 09/30/2012 0949   CO2 25 10/08/2018 1125   CO2 25 04/21/2017 0823   BUN 19 10/08/2018 1125   BUN 20.2 04/21/2017 0823   CREATININE 0.92 10/08/2018 1125   CREATININE 0.9 04/21/2017 0823      Component Value Date/Time   CALCIUM 9.7 10/08/2018 1125   CALCIUM 9.6 04/21/2017 0823   ALKPHOS 69 10/08/2018 1125   ALKPHOS 61 04/21/2017 0823   AST 21 10/08/2018 1125   AST 22 04/21/2017 0823   ALT 23 10/08/2018 1125   ALT 36 04/21/2017 0823   BILITOT 0.4 10/08/2018 1125   BILITOT 0.99 04/21/2017 0823     BONE MARROW REPORT FINAL DIAGNOSIS RADIOGRAPHIC STUDIES: No results found.  ASSESSMENT AND PLAN:  This is a very pleasant 78 years old white male with idiopathic thrombocytopenic purpura who is currently on observation.  He has CBC performed earlier today that showed platelets count of 168,000. The patient is feeling fine today with no concerning complaints and his platelets count are within the normal range. I recommended for him to continue on observation with repeat CBC and follow-up visit in 1 year. He was advised to call immediately if he has any bleeding issues or other concerning complaints in the interval. All questions were answered. The patient knows to call the clinic with any problems, questions or concerns. We can certainly see the patient  much sooner if necessary.  Disclaimer: This note was dictated with voice recognition software. Similar sounding words can inadvertently be transcribed and may not be corrected upon review.

## 2019-05-01 DIAGNOSIS — Z23 Encounter for immunization: Secondary | ICD-10-CM | POA: Diagnosis not present

## 2019-05-03 ENCOUNTER — Other Ambulatory Visit: Payer: Self-pay | Admitting: Cardiovascular Disease

## 2019-05-05 NOTE — Progress Notes (Signed)
Chart and office note reviewed in detail  > agree with a/p as outlined    

## 2019-05-26 DIAGNOSIS — L819 Disorder of pigmentation, unspecified: Secondary | ICD-10-CM | POA: Diagnosis not present

## 2019-05-26 DIAGNOSIS — L298 Other pruritus: Secondary | ICD-10-CM | POA: Diagnosis not present

## 2019-05-26 DIAGNOSIS — L570 Actinic keratosis: Secondary | ICD-10-CM | POA: Diagnosis not present

## 2019-06-06 ENCOUNTER — Other Ambulatory Visit: Payer: Self-pay | Admitting: Internal Medicine

## 2019-06-28 DIAGNOSIS — L578 Other skin changes due to chronic exposure to nonionizing radiation: Secondary | ICD-10-CM | POA: Diagnosis not present

## 2019-07-01 ENCOUNTER — Other Ambulatory Visit: Payer: Self-pay | Admitting: Cardiovascular Disease

## 2019-07-26 ENCOUNTER — Other Ambulatory Visit (HOSPITAL_COMMUNITY)
Admission: RE | Admit: 2019-07-26 | Discharge: 2019-07-26 | Disposition: A | Discharge status: Home / Self Care | Payer: Medicare Other | Source: Ambulatory Visit | Attending: Internal Medicine | Admitting: Internal Medicine

## 2019-07-26 DIAGNOSIS — Z01812 Encounter for preprocedural laboratory examination: Secondary | ICD-10-CM | POA: Diagnosis present

## 2019-07-26 DIAGNOSIS — Z20828 Contact with and (suspected) exposure to other viral communicable diseases: Secondary | ICD-10-CM | POA: Insufficient documentation

## 2019-07-27 LAB — NOVEL CORONAVIRUS, NAA (HOSP ORDER, SEND-OUT TO REF LAB; TAT 18-24 HRS): SARS-CoV-2, NAA: NOT DETECTED

## 2019-07-29 ENCOUNTER — Telehealth: Payer: Self-pay | Admitting: *Deleted

## 2019-07-29 ENCOUNTER — Other Ambulatory Visit: Payer: Self-pay

## 2019-07-29 ENCOUNTER — Other Ambulatory Visit: Payer: Self-pay | Admitting: Internal Medicine

## 2019-07-29 ENCOUNTER — Encounter: Payer: Self-pay | Admitting: Internal Medicine

## 2019-07-29 ENCOUNTER — Ambulatory Visit (INDEPENDENT_AMBULATORY_CARE_PROVIDER_SITE_OTHER): Payer: Medicare Other | Admitting: Internal Medicine

## 2019-07-29 ENCOUNTER — Ambulatory Visit (INDEPENDENT_AMBULATORY_CARE_PROVIDER_SITE_OTHER): Payer: Medicare Other

## 2019-07-29 DIAGNOSIS — J45991 Cough variant asthma: Secondary | ICD-10-CM | POA: Diagnosis not present

## 2019-07-29 DIAGNOSIS — J849 Interstitial pulmonary disease, unspecified: Secondary | ICD-10-CM

## 2019-07-29 DIAGNOSIS — M450 Ankylosing spondylitis of multiple sites in spine: Secondary | ICD-10-CM | POA: Diagnosis not present

## 2019-07-29 DIAGNOSIS — J479 Bronchiectasis, uncomplicated: Secondary | ICD-10-CM | POA: Diagnosis not present

## 2019-07-29 DIAGNOSIS — I2510 Atherosclerotic heart disease of native coronary artery without angina pectoris: Secondary | ICD-10-CM

## 2019-07-29 LAB — PULMONARY FUNCTION TEST
DL/VA % pred: 98 %
DL/VA: 3.94 ml/min/mmHg/L
DLCO unc % pred: 68 %
DLCO unc: 15.24 ml/min/mmHg
FEF 25-75 Post: 3.39 L/sec
FEF 25-75 Pre: 2.75 L/sec
FEF2575-%Change-Post: 23 %
FEF2575-%Pred-Post: 182 %
FEF2575-%Pred-Pre: 148 %
FEV1-%Change-Post: 4 %
FEV1-%Pred-Post: 90 %
FEV1-%Pred-Pre: 85 %
FEV1-Post: 2.34 L
FEV1-Pre: 2.23 L
FEV1FVC-%Change-Post: 3 %
FEV1FVC-%Pred-Pre: 119 %
FEV6-%Change-Post: 1 %
FEV6-%Pred-Post: 77 %
FEV6-%Pred-Pre: 76 %
FEV6-Post: 2.6 L
FEV6-Pre: 2.57 L
FEV6FVC-%Pred-Post: 107 %
FEV6FVC-%Pred-Pre: 107 %
FVC-%Change-Post: 1 %
FVC-%Pred-Post: 71 %
FVC-%Pred-Pre: 70 %
FVC-Post: 2.6 L
FVC-Pre: 2.57 L
Post FEV1/FVC ratio: 90 %
Post FEV6/FVC ratio: 100 %
Pre FEV1/FVC ratio: 87 %
Pre FEV6/FVC Ratio: 100 %
RV % pred: 53 %
RV: 1.28 L
TLC % pred: 61 %
TLC: 3.89 L

## 2019-07-29 NOTE — Progress Notes (Signed)
PFT done today. 

## 2019-07-29 NOTE — Assessment & Plan Note (Signed)
See CT chest 11/26/16 = Scattered areas of cylindrical bronchiectasis - flutter added 12/12/2016  - prevnar 13 given 05/19/2018   Adequate control on present rx, reviewed in detail with pt > no change in rx needed  > using symbicort 80 2bid prn rarely with no flares or limiting sob or obst on pfts today off symbicort so no change rx needed

## 2019-07-29 NOTE — Progress Notes (Signed)
Brief patient profile:  56  yowm never smoker with ankylosing spondilitis  with new onset variable  cough since Oct 2016 comes and goes s background of asthma but with background of nasal allergies for which took shots and moved residence in 1980s  >  No further problems except nasal drip /[place] then fine [place] stopped going there.    History of Present Illness  Congestion in chest x Oct 2016 worse in evenings with mostly dry coughing and much worse x sev weeks > [place] eval on 07/15/16 cough for a week/ albterol and pred no better  rec Advair one puff twice per day until sample completed >> call if you feel you need a refill after this Zithromax 250 mg pill >> 2 pills on day 1, then 1 pill daily for next four days Albuterol 2 puffs every 4 to 6 hours as needed for cough, wheeze, or chest congestion Finish course of prednisone    07/22/2016 acute extended ov/[name] re:  Refractory cough > sob on advair and pred taper and getting worse Chief Complaint  Patient presents with  . Acute Visit    Increased SOB and cough are not improving since acute ov here 07/15/16. His cough has been occ prod with light colored sputum.  He states that his breathing does okay in the am's and then gets worse in the evenings.   on advair dpi/ benzoate and took pred 10 mg one tablet  This am and cough seems ok when he wakes up and progressively worse with Minimal actual sputum production as the day goes on and then does not really bother him while sleeping.. Shaking on details of care/ names of meds/ no better with saba  rec Take delsym two tsp every 12 hours and supplement if needed with  tramadol 50 mg up to 1 every 4 hours  .   Symbicort 80 Take 2 puffs first thing in am and then another 2 puffs about 12 hours later - if helping refill it Only use your albuterol (ventolin) inhaler  As long as coughing at all:  Try prilosec otc 20mg   Take 30-60 min before first meal of the day  and Pepcid ac (famotidine) 20 mg one @  bedtime until cough is completely gone for at least a week without the need for cough suppression GERD diet    07/30/2016 acute extended ov/[name] re: refractory cough since late Nov 2017  Chief Complaint  Patient presents with  . Acute Visit    Pt c/o increased cough and congestion for the past 2 wks. He states symptoms get worse in the evening. Cough is prod in the am's with thick, clear sputum.  He states he can not lie down flat due to cough and cough sometimes waked him up. He also c/o wheezing.   brought all his meds, not using symb 2bid per count, only missing 11 tramadol "it's for pain, right? "  Some better while on pred, worse since finished it/ only taking prilosec otc 20 mg qam / is taking pepcid 20 mg hs  Not using any saba "it's for emergencies, right?"  rec Prednisone 10 mg take  4 each am x 2 days,   2 each am x 2 days,  1 each am x 2 days and stop  Increase the prilosec ac to where you take x 2 30-60 mn before breakfast  And continue the pepcid ac 20mg  in evening  Continue symbicort 80 (same  as dulera 100 sample) Take 2 puffs first thing in am and then another 2 puffs about 12 hours later.  Only use your albuterol as a rescue medication  For cough use mucinex dm 1200 mg every 12 hours and supplement with the tramadol up to 2 every 4 hours until no cough x 3 days  Please see patient coordinator before you leave today  to schedule sinus CT    08/21/16  Restart Symbicort 2 puffs Twice daily  .  Restart Prilosec 20mg  daily before meal  Restart Pepcid 20mg  At bedtime   May use Zyrtec 10mg  At bedtime  As needed  Drainage  Zpack take as directed.  Mucinex DM Twice daily  As needed  Cough/congestion .     10/10/2016 acute extended ov/[name] re:  uacs vs asthma  maint on symb 80 2bid / gerd rx with otcs only  Chief Complaint  Patient presents with  . Acute Visit    Pt c/o cough and increased SOB x 1 wk, and progressively worse over the past  3-4 days. His cough is prod with thick, clear sputum. He states can not lie down flat without cough.   sensation of something stuck never gone away since = oct 2016 then acutely worse sob/ cough x 1 week prior to OV  And not better p saba Does not have mucinex dm / no zyrtec  rec First take delsym two tsp every 12 hours and supplement if needed with  tramadol 50 mg up to 2 every 4 hours to suppress the urge to cough at all or even clear your throat. Swallowing water or using ice chips/non mint and menthol containing candies (such as lifesavers or sugarless jolly ranchers) are also effective.  You should rest your voice and avoid activities that you know make you cough. Once you have eliminated the cough for 3 straight days try reducing the tramadol first,  then the delsym as tolerated.   Prednisone 10 mg take  4 each am x 2 days,   2 each am x 2 days,  1 each am x 2 days and stop (this is to eliminate allergies and inflammation from coughing) Protonix (pantoprazole) Take 30-60 min before first meal of the day and Pepcid 20 mg one bedtime plus chlorpheniramine 4 mg x 2 at bedtime (both available over the counter)  until cough is completely gone for at least a week without the need for cough suppression For drainage / throat tickle try take CHLORPHENIRAMINE  4 mg - take one every 4 hours as needed - available over the counter- may cause drowsiness so start with just a bedtime dose or two and see how you tolerate it before trying in daytime   GERD diet  Stop prilosec/ mucinex  Please remember to go to the lab  department downstairs in the basement  for your tests - we will call you with the results when they are available.  Please schedule a follow up office visit in 2 weeks, sooner if needed  with all medications /inhalers/ solutions in hand so we can verify exactly what you are taking. This includes all medications from all doctors and over the counters Add did not got to lab as rec    10/17/2016  f/u  ov/[name] re: intermittent cough x oct 2016 def gets better on prednisone / sense of chest congestion but no excess mucus Chief Complaint  Patient presents with  . Follow-up    PFT's done today.  symptoms of daily  cough  worse p supper Did not bring meds as requested / not clear he even read them when reading them back line by line  Not using any albuterol "doesn't help"  Not able to lie down due to cough x month x no better even while on prednisone though "80% better" on pred vs off it/ no longer using cpap rec Add singulair each evening 10 daily trial basis  Please remember to go to the lab  department downstairs in the basement  for your tests - we will call you with the results when they are available. See Tammy NP in 4  weeks with all your medications, same page with your medication use she will arrange follow up with me or with allergy depending on your tests  Add : consider adding flutter if doesn't have one and HRCT looking for bronchiectasis > POS    05/19/2018  f/u ov/[name] re: bronchiectasis/ chronic cough/ never took demerol, only took a few prednisone > 95% resolution at med cal ov 03/03/18 so d/c'd singulair and not taking symb 80 as maint (brought med calendar/ not following as written) Chief Complaint  Patient presents with  . Follow-up    increased chest congestion and cough x 3-4 wks. He is coughing up some cream colored sputum.   Dyspnea:  Still able to jog / steps ok   Cough: was better for months but never completely gone then worse x 4 weeks esp  In am Sleeping: 2- 3 pillows flat bed - no bed blocks SABA use: not using at  02: none  rec rec Bronchiectasis =   you have scarring of your bronchial tubes  Whenever you develop cough congestion take  mucinex dm up to 1200 mg every 12 hours  >  Symbicort is supposed to be 1-2 puffs 1st thing in am and repeat  every 12 hours if you think you need it or not If cough getting worse >  Prednisone 10 mg take  4 each am x 2 days,    2 each am x 2 days,  1 each am x 2 days and stop  Consider placing 6 in bed blocks under the head of your bed (available at most mattress stores)       06/19/2018  f/u ov/[name] re: bronchiectasis/ did not follow any of the action plans on med calendar he brought with him , still on symb 80 just 1 bid despite flare of cough/ not using mucienx dm or flutter approp Chief Complaint  Patient presents with  . Follow-up    non prod cough x 1 wk.  He has not had to use his prn pred or albuterol.   Dyspnea:  Still able to jog and go up to steps Cough: non prod cough  Worse when wake up but non productive  Sleeping: 3 pillows no bed blocks SABA use: no  rec Symbicort 80 should be 2  Every 12 hours unless you are doing great See calendar for specific medication instructions      09/21/2018  f/u ov/[name] re: Bronchiectasis  Chief Complaint  Patient presents with  . Follow-up    Breathing is overall doing well. He has not had to use the prn pred taper or albuterol.   Dyspnea:  Walks a mile with the dog / up some up some hills  Cough: gone now  Sleeping: to pee/ special pillow, no bed blocks  SABA use: none  rec Again I recommend the 6-8 inch bed under the  head of the bed We changed the symbicort 80 to where you only take it up to 2 puffs every 12 hours for cough/ congestion as needed as your "plan A"    07/29/2019  f/u ov/[name] re: Bronchiectasis/ Ankylosing spondylitis - using rare symb 80 2 bid prn (still on samples) Chief Complaint  Patient presents with  . Follow-up    PFT done. Breathing is unchanged since the last visit. No new co's.   Dyspnea:  Physically active but  no longer regular walks - more limited by back pain  Cough: resolved Sleeping: bed blocks/cpap / no resp problems  SABA use: none  02: no    No obvious day to day or daytime variability or assoc excess/ purulent sputum or mucus plugs or hemoptysis or cp or chest tightness, subjective wheeze or overt sinus or hb  symptoms.   Sleeping as above without nocturnal  or early am exacerbation  of respiratory  c/o's or need for noct saba. Also denies any obvious fluctuation of symptoms with weather or environmental changes or other aggravating or alleviating factors except as outlined above   No unusual exposure hx or h/o childhood pna/ asthma or knowledge of premature birth.  Current Allergies, Complete Past Medical History, Past Surgical History, Family History, and Social History were reviewed in [employer] record.  ROS  The following are not active complaints unless bolded Hoarseness, sore throat, dysphagia, dental problems, itching, sneezing,  nasal congestion or discharge of excess mucus or purulent secretions, ear ache,   fever, chills, sweats, unintended wt loss or wt gain, classically pleuritic or exertional cp,  orthopnea pnd or arm/hand swelling  or leg swelling, presyncope, palpitations, abdominal pain, anorexia, nausea, vomiting, diarrhea  or change in bowel habits or change in bladder habits, change in stools or change in urine, dysuria, hematuria,  rash, arthralgias, visual complaints, headache, numbness, weakness or ataxia or problems with walking or coordination,  change in mood or  memory.        Current Meds  Medication Sig  . amLODipine (NORVASC) 5 MG tablet TAKE 1 TABLET EVERY DAY  . Apoaequorin (PREVAGEN PO) Take 1 tablet by mouth daily.   [name] aspirin 81 MG EC tablet Take 81 mg by mouth daily.   . budesonide-formoterol (SYMBICORT) 80-4.5 MCG/ACT inhaler Inhale 2 puffs into the lungs as needed.  . chlorpheniramine (CHLOR-TRIMETON) 4 MG tablet Take 4 mg by mouth 2 (two) times daily as needed for allergies.  [name] dextromethorphan-guaiFENesin (MUCINEX DM) 30-600 MG 12hr tablet Take 2 tablets by mouth 2 (two) times daily as needed for cough.  . famotidine (PEPCID) 20 MG tablet Take 20 mg by mouth at bedtime.  . Multiple Vitamins-Minerals (ICAPS AREDS 2) CAPS Take by mouth  daily.  . pantoprazole (PROTONIX) 40 MG tablet TAKE 1 TABLET BY MOUTH ONCE DAILY 30  TO  60  MINUTES  BEFORE  FIRST  MEAL  OF  THE  DAY  . Respiratory Therapy Supplies (FLUTTER) DEVI by Does not apply route as directed.  . simvastatin (ZOCOR) 20 MG tablet TAKE 1 TABLET EVERY DAY WITH BREAKFAST  . UNABLE TO FIND Med Name: CPAP per Dr Halford Chessman                     Pulmonary tests CT angio chest 11/30/15 >> motion artifact, mosaic pattern, calcified granuloma SNIFF test 11/30/15 >> elevated Rt diaphragm, but has b/l diaphragm movement  Cardiac tests 10/16/15 Doppler legs b/l >> no DVT  11/30/15 Echo >> EF 65 to [id number], grade 1 diastolic CHF  Sleep tests HST 12/21/15 >> AHI 44.5, SpO2 low 64% Auto CPAP 03/02/16 to 03/31/16 >> used on 26 of 30 nights with average 6 hrs 32 min.  Average AHI 5.5 with median CPAP 8 and 95 th percentile CPAP 11 cm H2O ONO with CPAP 04/10/16 >> test time 6 hrs 30 min.  Mean SpO2 93.51%, low SpO2 77%.  Spent 7 min with SpO2 < 88%   Past medical history Ankylosing spondylitis, Allergies, CAD, HLD, HTN, Nephrolithiasis, Malaria      Physical Exam  07/29/2019    175 09/21/2018      182  02/18/2018       186  02/13/2018         190  11/17/2017         186  05/20/2017       190  01/15/2017         183  12/12/2016         186  10/17/2016         183  10/10/2016         187  07/30/2016     191  07/22/16 194 lb (88 kg)  07/15/16 196 lb (88.9 kg)  07/12/16 192 lb 9.6 oz (87.4 kg)     Vital signs reviewed - Note on arrival 02 sats  94% on RA     HEENT : pt wearing mask not removed for exam due to covid -19 concerns.    NECK :  without JVD/Nodes/TM/ nl carotid upstrokes bilaterally   LUNGS: no acc muscle use,  Nl contour chest minimal insp crackles bases  without cough on insp or exp maneuvers   CV:  RRR  no s3 or murmur or increase in P2, and no edema   ABD:  soft and nontender with nl inspiratory excursion in the supine position. No bruits or organomegaly appreciated,  bowel sounds nl  MS:  Nl gait/ ext warm without deformities, calf tenderness, cyanosis or clubbing No obvious joint restrictions   SKIN: warm and dry without lesions    NEURO:  alert, approp, nl sensorium with  no motor or cerebellar deficits apparent.      CXR PA and Lateral:   07/29/2019 :    I personally reviewed images and agree with radiology impression as follows:    Chronic elevation of RIGHT diaphragm with chronic bronchitic changes and RIGHT basilar atelectasis.  No acute abnormalities.

## 2019-07-29 NOTE — Patient Instructions (Addendum)
Stay as physically active as your back will let you   We will be referring you back to Dr Estanislado Pandy  Please remember to go to the  x-ray department  for your tests - we will call you with the results when they are available    Please schedule a follow up visit in 12  months but call sooner if needed

## 2019-07-29 NOTE — Assessment & Plan Note (Signed)
Onset Oct 2016  07/22/2016    > try reduce symbicort to 80 2bid  - FENO 07/15/2016  =   42 on sym 160 2bid  - 07/30/2016  After extensive coaching HFA effectiveness =    90% > continue symb 80 2bid  - Sinus CT 07/31/2016 >>> neg - FENO 10/10/2016  =   12 during flare while on symb 80 2bid - PFT's  10/17/2016  FEV1 2.40 (89 % ) ratio 87  p 0 % improvement from saba p symb 80 x 2 prior to study with DLCO  60/58 % corrects to 90 % for alv volume and erv =50%  - Allergy profile 10/17/2016 >  Eos 0.0 /  IgE 55 Rast pos dust only   - 10/17/2016  Added singulair 10 mg  - HRCT chest 11/2016  -NSIP Changes/Bronchiectasis noted  - 12/12/2016 spirometry s obst with active symptoms >> trial off symbicort as not convinced helping - 01/13/17 trial of dymista > improved 05/19/2017 but did not continue it due to cost  - 11/10/17 d/c'd ppi and did not refill > 11/17/17 rec ok to leave off but restart prilosec for cough  - 02/07/18 recurrent severe cough on last day of [place] cruise > did not follow action plan  - FENO 02/18/2018  =   11 during flare p am prednisone - Spirometry 02/18/2018  No obst on no rx  Prior with active wheeze on exam - 03/03/18 d/c'd singulair  - 05/19/2018  After extensive coaching inhaler device,  effectiveness =    90% restart symb 80 1-2 q 12h prn  Resolved to his satisfaction / min need for symbicort while on gerd rx and 1st gen H1 blockers per guidelines    Even is there is asthma here, ok to use symb 80 2bid prn Based on two studies from NEJM  378; 20 p 1865 (2018) and 380 : p2020-30 (2019) in pts with mild asthma it is reasonable to use low dose symbicort eg 80 2bid "prn" flare in this setting but I emphasized this was only shown with symbicort and takes advantage of the rapid onset of action but is not the same as "rescue therapy" but can be stopped once the acute symptoms have resolved and the need for rescue has been minimized (< 2 x weekly)    >>> f/u yearly, sooner prn

## 2019-07-29 NOTE — Assessment & Plan Note (Signed)
Pain is poorly controlled > refer back to rheumatology (has seen Deveswar in past )  cxr today.   Pt informed of the seriousness of COVID 19 infection as a direct risk to their health  and safey and to those of their loved ones and should continue to wear facemask in public and minimize exposure to public locations but especially avoid any area or activity where non-close contacts are not observing distancing or wearing an appropriate face mask > vaccine as soon as offered strongly rec  I had an extended discussion with the patient reviewing all relevant studies completed to date and  lasting 15 to 20 minutes of a 25 minute visit    Each maintenance medication was reviewed in detail including most importantly the difference between maintenance and prns and under what circumstances the prns are to be triggered using an action plan format that is not reflected in the computer generated alphabetically organized AVS.     Please see AVS for specific instructions unique to this visit that I personally wrote and verbalized to the the pt in detail and then reviewed with pt  by my nurse highlighting any  changes in therapy recommended at today's visit to their plan of care.

## 2019-07-29 NOTE — Telephone Encounter (Signed)
[name] Rockers, MD  07/29/2019 2:52 PM EST    Call pt: Reviewed cxr and no acute change so no change in recommendations made at [facility] with pt and notified of results per Dr. Melvyn Novas. Pt verbalized understanding and denied any questions.

## 2019-07-29 NOTE — Assessment & Plan Note (Addendum)
HRCT chest 4/17/ 2018 >  suggestive of interstitial lung disease, with a pattern that is most compatible with nonspecific interstitial pneumonia (NSIP).  - PFT's  10/17/2016    FVC 2.73 (73%) with DLCO  16.71(60%) and corrects to 3.95(90%) for alv vol PFT's  07/29/2019  FVC 2.57 (80 % ) no obst with DLCO  15.24 (68%) corrects to 3.94 (98%)  for alv volume and FV curve somwhat flattened insp portion/ nl exp - 07/29/2019 referred back to Dr Estanislado Pandy

## 2019-07-30 ENCOUNTER — Encounter: Payer: Self-pay | Admitting: Internal Medicine

## 2019-09-12 ENCOUNTER — Other Ambulatory Visit: Payer: Self-pay | Admitting: Internal Medicine

## 2019-09-24 ENCOUNTER — Ambulatory Visit: Payer: Self-pay

## 2019-09-24 ENCOUNTER — Ambulatory Visit (INDEPENDENT_AMBULATORY_CARE_PROVIDER_SITE_OTHER): Payer: Medicare Other

## 2019-09-24 ENCOUNTER — Ambulatory Visit (INDEPENDENT_AMBULATORY_CARE_PROVIDER_SITE_OTHER): Payer: Medicare Other | Admitting: Rheumatology

## 2019-09-24 ENCOUNTER — Other Ambulatory Visit: Payer: Self-pay

## 2019-09-24 ENCOUNTER — Encounter: Payer: Self-pay | Admitting: Rheumatology

## 2019-09-24 VITALS — BP 137/63 | HR 66 | Resp 18 | Ht 66.0 in | Wt 176.6 lb

## 2019-09-24 DIAGNOSIS — I10 Essential (primary) hypertension: Secondary | ICD-10-CM

## 2019-09-24 DIAGNOSIS — M533 Sacrococcygeal disorders, not elsewhere classified: Secondary | ICD-10-CM

## 2019-09-24 DIAGNOSIS — D696 Thrombocytopenia, unspecified: Secondary | ICD-10-CM

## 2019-09-24 DIAGNOSIS — Z8659 Personal history of other mental and behavioral disorders: Secondary | ICD-10-CM

## 2019-09-24 DIAGNOSIS — G8929 Other chronic pain: Secondary | ICD-10-CM

## 2019-09-24 DIAGNOSIS — M545 Low back pain, unspecified: Secondary | ICD-10-CM

## 2019-09-24 DIAGNOSIS — Z860100 Personal history of colon polyps, unspecified: Secondary | ICD-10-CM

## 2019-09-24 DIAGNOSIS — M67432 Ganglion, left wrist: Secondary | ICD-10-CM

## 2019-09-24 DIAGNOSIS — J479 Bronchiectasis, uncomplicated: Secondary | ICD-10-CM | POA: Diagnosis not present

## 2019-09-24 DIAGNOSIS — J849 Interstitial pulmonary disease, unspecified: Secondary | ICD-10-CM

## 2019-09-24 DIAGNOSIS — M7711 Lateral epicondylitis, right elbow: Secondary | ICD-10-CM

## 2019-09-24 DIAGNOSIS — E785 Hyperlipidemia, unspecified: Secondary | ICD-10-CM | POA: Diagnosis not present

## 2019-09-24 DIAGNOSIS — M25562 Pain in left knee: Secondary | ICD-10-CM

## 2019-09-24 DIAGNOSIS — M25572 Pain in left ankle and joints of left foot: Secondary | ICD-10-CM

## 2019-09-24 DIAGNOSIS — K219 Gastro-esophageal reflux disease without esophagitis: Secondary | ICD-10-CM

## 2019-09-24 DIAGNOSIS — M546 Pain in thoracic spine: Secondary | ICD-10-CM | POA: Diagnosis not present

## 2019-09-24 DIAGNOSIS — K760 Fatty (change of) liver, not elsewhere classified: Secondary | ICD-10-CM

## 2019-09-24 DIAGNOSIS — M25571 Pain in right ankle and joints of right foot: Secondary | ICD-10-CM

## 2019-09-24 DIAGNOSIS — G4733 Obstructive sleep apnea (adult) (pediatric): Secondary | ICD-10-CM

## 2019-09-24 DIAGNOSIS — M19041 Primary osteoarthritis, right hand: Secondary | ICD-10-CM | POA: Diagnosis not present

## 2019-09-24 DIAGNOSIS — M19042 Primary osteoarthritis, left hand: Secondary | ICD-10-CM

## 2019-09-24 DIAGNOSIS — I25119 Atherosclerotic heart disease of native coronary artery with unspecified angina pectoris: Secondary | ICD-10-CM

## 2019-09-24 DIAGNOSIS — M25561 Pain in right knee: Secondary | ICD-10-CM

## 2019-09-24 DIAGNOSIS — Z8613 Personal history of malaria: Secondary | ICD-10-CM

## 2019-09-24 NOTE — Progress Notes (Signed)
Office Visit Note  Patient: [name]             Date of Birth: [birth year]           MRN: [id number]             PCP: [name], MD Referring: [name], MD Visit Date: 09/24/2019 Occupation: @GUAROCC @  Subjective:  Pain in multiple joints.  History of Present Illness: [name] is a 79 y.o. male seen in consultation per request of his PCP.  According to patient his symptoms are started in 2007 with lower back pain.  He is recalls that he saw me at the time and there was a suspicion of ankylosing spondylitis.  I could not find any records regarding his visit.  He states in 2018 he underwent stent procedure for MI and it was related to possible Celebrex use.  He stopped taking all the anti-inflammatories.  He states over time he developed increased pain and discomfort.  Over the last 1 year his lower back pain has increased.  He has been going to a chiropractor on a regular basis.  He also complains of discomfort in the thoracic region and SI joint region.  He states he had one episode of plantar fasciitis in the past for which she had a cortisone injection.  He also lost weight and is started using orthotics which was helpful with no recurrences.  He has some discomfort in his bilateral knee joints and bilateral ankles.  He is also noticed decreased grip strength in his hands.  He is tries to be active and exercises on a regular basis.  He states 1-1/2 years ago he developed chronic cough and was seen by Dr. Melvyn Novas at the time he was diagnosed with interstitial lung disease and bronchiectasis.  He states his disease is a stable and does not need any aggressive therapy.  He currently does not have any cough or shortness of breath.  He believes that his mom probably had some form of inflammatory arthritis.  Activities of Daily Living:  Patient reports morning stiffness for 10 minutes.   Patient Denies nocturnal pain.  Difficulty dressing/grooming: Reports Difficulty  climbing stairs: Denies Difficulty getting out of chair: Reports Difficulty using hands for taps, buttons, cutlery, and/or writing: Reports  Review of Systems  Constitutional: Negative for fatigue and night sweats.  HENT: Negative for mouth sores, mouth dryness and nose dryness.   Eyes: Negative for redness and dryness.  Respiratory: Negative for shortness of breath and difficulty breathing.   Cardiovascular: Negative for chest pain, palpitations, hypertension, irregular heartbeat and swelling in legs/feet.  Gastrointestinal: Negative for constipation and diarrhea.  Endocrine: Positive for increased urination.  Genitourinary: Negative for difficulty urinating.  Musculoskeletal: Positive for arthralgias, gait problem, joint pain, myalgias, morning stiffness and myalgias. Negative for joint swelling, muscle weakness and muscle tenderness.  Skin: Negative for color change, rash, hair loss, nodules/bumps, skin tightness, ulcers and sensitivity to sunlight.  Allergic/Immunologic: Negative for susceptible to infections.  Neurological: Positive for numbness. Negative for dizziness, fainting, memory loss, night sweats and weakness.  Hematological: Negative for bruising/bleeding tendency and swollen glands.  Psychiatric/Behavioral: Negative for depressed mood and sleep disturbance. The patient is not nervous/anxious.     PMFS History:  Patient Active Problem List   Diagnosis Date Noted  . GERD (gastroesophageal reflux disease) 12/15/2017  . Unstable angina ([place]) 12/05/2017  . Angina at rest [facility]) 12/05/2017  . Atypical chest pain   . Dyslipidemia   .  COPD not affecting current episode of care [facility])   . Essential hypertension 05/29/2017  . Abnormal nuclear cardiac imaging test 01/14/2017  . Bronchiectasis without complication ([place]) [id number]  . ILD (interstitial lung disease) ([place]) 11/27/2016  . Dizziness 10/21/2016  . Cough variant asthma  vs UACS 07/22/2016  . Obstructive sleep apnea  12/01/2015  . Acute bronchitis 11/30/2015  . Swelling of lower extremity 10/16/2015  . Benign mole 02/04/2014  . Impaired glucose tolerance 10/26/2013  . NAFLD (nonalcoholic fatty liver disease) 04/27/2013  . Thrombocytopenia ([place]) 10/21/2012  . Exertional angina ([facility]) 09/03/2012  . MALARIA 12/05/2009  . COLONIC POLYPS 12/05/2009  . HYPERCHOLESTEROLEMIA 12/05/2009  . ANXIETY 12/05/2009  . HEMORRHOIDS 12/05/2009  . Allergic rhinitis 12/05/2009  . ANKYLOSING SPONDYLITIS 12/05/2009  . GANGLION CYST, WRIST, LEFT 12/05/2009  . ROTATOR CUFF TEAR 12/05/2009  . OTHER DISORDER OF MUSCLE LIGAMENT AND FASCIA 12/05/2009  . ABNORMAL CHEST XRAY 12/05/2009  . CAD (coronary artery disease) 04/05/2009    Past Medical History:  Diagnosis Date  . Abnormal chest x-ray   . Allergic rhinitis   . Ankylosing spondylitis ([place])   . Anxiety   . Arthritis   . Colonic polyp   . Coronary atherosclerosis of native coronary artery   . Cough   . Dizziness 10/21/2016  . Ganglion cyst of wrist    left  . Heart attack ([place]) 2008  . Hemorrhoids   . History of malaria   . Hyperlipidemia    with low HDL  . Hypertension   . Kidney stone   . Other disorder of muscle, ligament, and fascia   . Rotator cuff tear   . Swelling of lower extremity 10/16/2015    Family History  Problem Relation Age of Onset  . Cancer Mother        melanoma  . Emphysema Mother   . Rheum arthritis Mother   . Diabetes Mother    Past Surgical History:  Procedure Laterality Date  . bicep rupture    . CORONARY ANGIOPLASTY WITH STENT PLACEMENT    . HEMORRHOID SURGERY  10/10   in [place]  . LEFT HEART CATH AND CORONARY ANGIOGRAPHY N/A 01/14/2017   Procedure: Left Heart Cath and Coronary Angiography;  Surgeon: [name], MD;  Location: [facility];  Service: Cardiovascular;  Laterality: N/A;  . LEFT HEART CATHETERIZATION WITH CORONARY ANGIOGRAM N/A 09/03/2012   Procedure: LEFT HEART CATHETERIZATION WITH CORONARY ANGIOGRAM;   Surgeon: [name], MD;  Location: [facility] CATH LAB;  Service: Cardiovascular;  Laterality: N/A;  . ROTATOR CUFF REPAIR     bilat by Dr. French Ana   Social History   Social History Narrative  . Not on file   Immunization History  Administered Date(s) Administered  . Fluad Quad(high Dose 65+) 05/01/2019  . Influenza Split 06/01/2012  . Influenza Whole 05/30/2009, 04/20/2010  . Influenza, High Dose Seasonal PF 05/19/2017, 05/19/2018  . Influenza-Unspecified 07/12/2013, 05/10/2016  . Pneumococcal Conjugate-13 05/19/2018  . Pneumococcal Polysaccharide-23 08/22/2009  . Tdap 12/04/2010, 06/04/2011, 07/31/2018  . Zoster 05/17/2008     Objective: Vital Signs: BP 137/63 (BP Location: Right Arm, Patient Position: Sitting, Cuff Size: Large)   Pulse 66   Resp 18   Ht 5\' 6"  (1.676 m)   Wt 176 lb 9.6 oz (80.1 kg)   BMI 28.50 kg/m    Physical Exam Vitals and nursing note reviewed.  Constitutional:      Appearance: He is well-developed.  HENT:     Head: Normocephalic and atraumatic.  Eyes:     Conjunctiva/sclera: Conjunctivae normal.     Pupils: Pupils are equal, round, and reactive to light.  Cardiovascular:     Rate and Rhythm: Normal rate and regular rhythm.     Heart sounds: Normal heart sounds.  Pulmonary:     Effort: Pulmonary effort is normal.     Breath sounds: Normal breath sounds.  Abdominal:     General: Bowel sounds are normal.     Palpations: Abdomen is soft.  Musculoskeletal:     Cervical back: Normal range of motion and neck supple.  Skin:    General: Skin is warm and dry.     Capillary Refill: Capillary refill takes less than 2 seconds.  Neurological:     Mental Status: He is alert and oriented to person, place, and time.  Psychiatric:        Behavior: Behavior normal.      Musculoskeletal Exam: C-spine was in good range of motion.  He has limited range of motion of thoracic and lumbar spine.  There was mild tenderness over SI joints.  Shoulder joints elbow  joints wrist joints with good range of motion.  Notice of the patient over right epicondyle area.  He has bilateral DIP thickening with no synovitis.  A ganglion cyst was noted over the left wrist volar aspect.  Hip joints and knee joints were in good range of motion with no synovitis.  He had tenderness over the inferior aspect of his lateral malleolus.  No synovitis was noted.  Some DIP and PIP changes in his feet were noted.  CDAI Exam: CDAI Score: -- Patient Global: --; Provider Global: -- Swollen: --; Tender: -- Joint Exam 09/24/2019   No joint exam has been documented for this visit   There is currently no information documented on the homunculus. Go to the Rheumatology activity and complete the homunculus joint exam.  Investigation: No additional findings.  Imaging: No results found.  Recent Labs: Lab Results  Component Value Date   WBC 6.0 04/28/2019   HGB 15.1 04/28/2019   PLT 168 04/28/2019   NA 145 (H) 10/08/2018   K 4.2 10/08/2018   CL 103 10/08/2018   CO2 25 10/08/2018   GLUCOSE 107 (H) 10/08/2018   BUN 19 10/08/2018   CREATININE 0.92 10/08/2018   BILITOT 0.4 10/08/2018   ALKPHOS 69 10/08/2018   AST 21 10/08/2018   ALT 23 10/08/2018   PROT 6.7 10/08/2018   ALBUMIN 4.9 (H) 10/08/2018   CALCIUM 9.7 10/08/2018   GFRAA 94 10/08/2018    Speciality Comments: No specialty comments available.  Procedures:  No procedures performed Allergies: Celebrex [celecoxib] and Tramadol   Assessment / Plan:     Visit Diagnoses: Chronic midline low back pain without sciatica -he has chronic discomfort in his lumbar spine.  Plan: XR Lumbar Spine 2-3 Views.  These findings are consistent with degenerative disc disease.  A handout on back exercises was given.  I also offered physical therapy which she declined.  I advised him to stay active and do stretching exercises.  Pain in thoracic spine -he complains of pain and discomfort in his thoracic spine.  Plan: XR Thoracic Spine 2  View.  These findings are consistent with degenerative disease of the thoracic spine  Chronic SI joint pain -he complains of discomfort in the SI joints.  Plan: XR Pelvis 1-2 Views.  Some osteoarthritic changes in the SI joints were noted.    Chronic pain of both knees -he feels  his knee joints are getting weaker and he has difficulty getting up from the chair at times.  He had good muscle strength in his lower extremities.  No warmth swelling or effusion was noted in his knee joints.  Plan: XR KNEE 3 VIEW RIGHT, XR KNEE 3 VIEW LEFT.  X-rays were consistent with mild osteoarthritis and chondromalacia patella.  A handout on knee joint exercises was given.  Chronic pain of both ankles -he had tenderness over the inferior aspect of his lateral malleolus but no synovitis was noted.  Plan: XR Ankle 2 Views Right, XR Ankle 2 Views Left.  The x-rays were unremarkable.  Primary osteoarthritis of both hands-clinical findings are consistent with osteoarthritis.  No synovitis was noted.  Right lateral epicondylitis-patient had tenderness over the right lateral epicondyle area.  A handout on elbow exercises was given.  Ganglion cyst of wrist, left  ILD (interstitial lung disease) (HCC)-NSIP diagnosed by Dr. Melvyn Novas.  He denies any shortness of breath.  According to patient his disease has been stable.  Bronchiectasis without complication (HCC)  Thrombocytopenia (HCC)-followed by Dr. Earlie Server.  His recent labs showed normal platelet count.  Other medical problems are listed as follows:  Dyslipidemia  NAFLD (nonalcoholic fatty liver disease)  Gastroesophageal reflux disease without esophagitis  Obstructive sleep apnea  Essential hypertension  Coronary artery disease involving native coronary artery of native heart with angina pectoris ([place])  History of colonic polyps  History of anxiety  History of malaria  Orders: Orders Placed This Encounter  Procedures  . XR Lumbar Spine 2-3 Views  . XR  Thoracic Spine 2 View  . XR Pelvis 1-2 Views  . XR KNEE 3 VIEW RIGHT  . XR KNEE 3 VIEW LEFT  . XR Ankle 2 Views Right  . XR Ankle 2 Views Left   No orders of the defined types were placed in this encounter.   Face-to-face time spent with patient was 60 minutes. Greater than 50% of time was spent in counseling and coordination of care.  Follow-Up Instructions: Return in about 1 year (around 09/23/2020).   Bo Merino, MD  Note - This record has been created using Editor, commissioning.  Chart creation errors have been sought, but may not always  have been located. Such creation errors do not reflect on  the standard of medical care.

## 2019-09-24 NOTE — Patient Instructions (Signed)
Journal for Nurse Practitioners, 15(4), 263-267. Retrieved May 18, 2018 from [email or web address]">  Knee Exercises Ask your health care provider which exercises are safe for you. Do exercises exactly as told by your health care provider and adjust them as directed. It is normal to feel mild stretching, pulling, tightness, or discomfort as you do these exercises. Stop right away if you feel sudden pain or your pain gets worse. Do not begin these exercises until told by your health care provider. Stretching and range-of-motion exercises These exercises warm up your muscles and joints and improve the movement and flexibility of your knee. These exercises also help to relieve pain and swelling. Knee extension, prone 1. Lie on your abdomen (prone position) on a bed. 2. Place your left / right knee just beyond the edge of the surface so your knee is not on the bed. You can put a towel under your left / right thigh just above your kneecap for comfort. 3. Relax your leg muscles and allow gravity to straighten your knee (extension). You should feel a stretch behind your left / right knee. 4. Hold this position for __________ seconds. 5. Scoot up so your knee is supported between repetitions. Repeat __________ times. Complete this exercise __________ times a day. Knee flexion, active  1. Lie on your back with both legs straight. If this causes back discomfort, bend your left / right knee so your foot is flat on the floor. 2. Slowly slide your left / right heel back toward your buttocks. Stop when you feel a gentle stretch in the front of your knee or thigh (flexion). 3. Hold this position for __________ seconds. 4. Slowly slide your left / right heel back to the starting position. Repeat __________ times. Complete this exercise __________ times a day. Quadriceps stretch, prone  1. Lie on your abdomen on a firm surface, such as a bed or padded floor. 2. Bend your left / right knee and hold  your ankle. If you cannot reach your ankle or pant leg, loop a belt around your foot and grab the belt instead. 3. Gently pull your heel toward your buttocks. Your knee should not slide out to the side. You should feel a stretch in the front of your thigh and knee (quadriceps). 4. Hold this position for __________ seconds. Repeat __________ times. Complete this exercise __________ times a day. Hamstring, supine 1. Lie on your back (supine position). 2. Loop a belt or towel over the ball of your left / right foot. The ball of your foot is on the walking surface, right under your toes. 3. Straighten your left / right knee and slowly pull on the belt to raise your leg until you feel a gentle stretch behind your knee (hamstring). ? Do not let your knee bend while you do this. ? Keep your other leg flat on the floor. 4. Hold this position for __________ seconds. Repeat __________ times. Complete this exercise __________ times a day. Strengthening exercises These exercises build strength and endurance in your knee. Endurance is the ability to use your muscles for a long time, even after they get tired. Quadriceps, isometric This exercise stretches the muscles in front of your thigh (quadriceps) without moving your knee joint (isometric). 1. Lie on your back with your left / right leg extended and your other knee bent. Put a rolled towel or small pillow under your knee if told by your health care provider. 2. Slowly tense the muscles in the front of your left /   right thigh. You should see your kneecap slide up toward your hip or see increased dimpling just above the knee. This motion will push the back of the knee toward the floor. 3. For __________ seconds, hold the muscle as tight as you can without increasing your pain. 4. Relax the muscles slowly and completely. Repeat __________ times. Complete this exercise __________ times a day. Straight leg raises This exercise stretches the muscles in front  of your thigh (quadriceps) and the muscles that move your hips (hip flexors). 1. Lie on your back with your left / right leg extended and your other knee bent. 2. Tense the muscles in the front of your left / right thigh. You should see your kneecap slide up or see increased dimpling just above the knee. Your thigh may even shake a bit. 3. Keep these muscles tight as you raise your leg 4-6 inches (10-15 cm) off the floor. Do not let your knee bend. 4. Hold this position for __________ seconds. 5. Keep these muscles tense as you lower your leg. 6. Relax your muscles slowly and completely after each repetition. Repeat __________ times. Complete this exercise __________ times a day. Hamstring, isometric 1. Lie on your back on a firm surface. 2. Bend your left / right knee about __________ degrees. 3. Dig your left / right heel into the surface as if you are trying to pull it toward your buttocks. Tighten the muscles in the back of your thighs (hamstring) to "dig" as hard as you can without increasing any pain. 4. Hold this position for __________ seconds. 5. Release the tension gradually and allow your muscles to relax completely for __________ seconds after each repetition. Repeat __________ times. Complete this exercise __________ times a day. Hamstring curls If told by your health care provider, do this exercise while wearing ankle weights. Begin with __________ lb weights. Then increase the weight by 1 lb (0.5 kg) increments. Do not wear ankle weights that are more than __________ lb. 1. Lie on your abdomen with your legs straight. 2. Bend your left / right knee as far as you can without feeling pain. Keep your hips flat against the floor. 3. Hold this position for __________ seconds. 4. Slowly lower your leg to the starting position. Repeat __________ times. Complete this exercise __________ times a day. Squats This exercise strengthens the muscles in front of your thigh and knee  (quadriceps). 1. Stand in front of a table, with your feet and knees pointing straight ahead. You may rest your hands on the table for balance but not for support. 2. Slowly bend your knees and lower your hips like you are going to sit in a chair. ? Keep your weight over your heels, not over your toes. ? Keep your lower legs upright so they are parallel with the table legs. ? Do not let your hips go lower than your knees. ? Do not bend lower than told by your health care provider. ? If your knee pain increases, do not bend as low. 3. Hold the squat position for __________ seconds. 4. Slowly push with your legs to return to standing. Do not use your hands to pull yourself to standing. Repeat __________ times. Complete this exercise __________ times a day. Wall slides This exercise strengthens the muscles in front of your thigh and knee (quadriceps). 1. Lean your back against a smooth wall or door, and walk your feet out 18-24 inches (46-61 cm) from it. 2. Place your feet hip-width apart. 3.   Slowly slide down the wall or door until your knees bend __________ degrees. Keep your knees over your heels, not over your toes. Keep your knees in line with your hips. 4. Hold this position for __________ seconds. Repeat __________ times. Complete this exercise __________ times a day. Straight leg raises This exercise strengthens the muscles that rotate the leg at the hip and move it away from your body (hip abductors). 1. Lie on your side with your left / right leg in the top position. Lie so your head, shoulder, knee, and hip line up. You may bend your bottom knee to help you keep your balance. 2. Roll your hips slightly forward so your hips are stacked directly over each other and your left / right knee is facing forward. 3. Leading with your heel, lift your top leg 4-6 inches (10-15 cm). You should feel the muscles in your outer hip lifting. ? Do not let your foot drift forward. ? Do not let your knee  roll toward the ceiling. 4. Hold this position for __________ seconds. 5. Slowly return your leg to the starting position. 6. Let your muscles relax completely after each repetition. Repeat __________ times. Complete this exercise __________ times a day. Straight leg raises This exercise stretches the muscles that move your hips away from the front of the pelvis (hip extensors). 1. Lie on your abdomen on a firm surface. You can put a pillow under your hips if that is more comfortable. 2. Tense the muscles in your buttocks and lift your left / right leg about 4-6 inches (10-15 cm). Keep your knee straight as you lift your leg. 3. Hold this position for __________ seconds. 4. Slowly lower your leg to the starting position. 5. Let your leg relax completely after each repetition. Repeat __________ times. Complete this exercise __________ times a day. This information is not intended to replace advice given to you by your health care provider. Make sure you discuss any questions you have with your health care provider. Document Revised: 05/19/2018 Document Reviewed: 05/19/2018 [employer] Patient Education  2020 [employer] Hand Exercises Hand exercises can be helpful for almost anyone. These exercises can strengthen the hands, improve flexibility and movement, and increase blood flow to the hands. These results can make work and daily tasks easier. Hand exercises can be especially helpful for people who have joint pain from arthritis or have nerve damage from overuse (carpal tunnel syndrome). These exercises can also help people who have injured a hand. Exercises Most of these hand exercises are gentle stretching and motion exercises. It is usually safe to do them often throughout the day. Warming up your hands before exercise may help to reduce stiffness. You can do this with gentle massage or by placing your hands in warm water for 10-15 minutes. It is normal to feel some stretching, pulling,  tightness, or mild discomfort as you begin new exercises. This will gradually improve. Stop an exercise right away if you feel sudden, severe pain or your pain gets worse. Ask your health care provider which exercises are best for you. Knuckle bend or "claw" fist 1. Stand or sit with your arm, hand, and all five fingers pointed straight up. Make sure to keep your wrist straight during the exercise. 2. Gently bend your fingers down toward your palm until the tips of your fingers are touching the top of your palm. Keep your big knuckle straight and just bend the small knuckles in your fingers. 3. Hold this position for   __________ seconds. 4. Straighten (extend) your fingers back to the starting position. Repeat this exercise 5-10 times with each hand. Full finger fist 1. Stand or sit with your arm, hand, and all five fingers pointed straight up. Make sure to keep your wrist straight during the exercise. 2. Gently bend your fingers into your palm until the tips of your fingers are touching the middle of your palm. 3. Hold this position for __________ seconds. 4. Extend your fingers back to the starting position, stretching every joint fully. Repeat this exercise 5-10 times with each hand. Straight fist 1. Stand or sit with your arm, hand, and all five fingers pointed straight up. Make sure to keep your wrist straight during the exercise. 2. Gently bend your fingers at the big knuckle, where your fingers meet your hand, and the middle knuckle. Keep the knuckle at the tips of your fingers straight and try to touch the bottom of your palm. 3. Hold this position for __________ seconds. 4. Extend your fingers back to the starting position, stretching every joint fully. Repeat this exercise 5-10 times with each hand. Tabletop 1. Stand or sit with your arm, hand, and all five fingers pointed straight up. Make sure to keep your wrist straight during the exercise. 2. Gently bend your fingers at the big  knuckle, where your fingers meet your hand, as far down as you can while keeping the small knuckles in your fingers straight. Think of forming a tabletop with your fingers. 3. Hold this position for __________ seconds. 4. Extend your fingers back to the starting position, stretching every joint fully. Repeat this exercise 5-10 times with each hand. Finger spread 1. Place your hand flat on a table with your palm facing down. Make sure your wrist stays straight as you do this exercise. 2. Spread your fingers and thumb apart from each other as far as you can until you feel a gentle stretch. Hold this position for __________ seconds. 3. Bring your fingers and thumb tight together again. Hold this position for __________ seconds. Repeat this exercise 5-10 times with each hand. Making circles 1. Stand or sit with your arm, hand, and all five fingers pointed straight up. Make sure to keep your wrist straight during the exercise. 2. Make a circle by touching the tip of your thumb to the tip of your index finger. 3. Hold for __________ seconds. Then open your hand wide. 4. Repeat this motion with your thumb and each finger on your hand. Repeat this exercise 5-10 times with each hand. Thumb motion 1. Sit with your forearm resting on a table and your wrist straight. Your thumb should be facing up toward the ceiling. Keep your fingers relaxed as you move your thumb. 2. Lift your thumb up as high as you can toward the ceiling. Hold for __________ seconds. 3. Bend your thumb across your palm as far as you can, reaching the tip of your thumb for the small finger (pinkie) side of your palm. Hold for __________ seconds. Repeat this exercise 5-10 times with each hand. Grip strengthening  1. Hold a stress ball or other soft ball in the middle of your hand. 2. Slowly increase the pressure, squeezing the ball as much as you can without causing pain. Think of bringing the tips of your fingers into the middle of  your palm. All of your finger joints should bend when doing this exercise. 3. Hold your squeeze for __________ seconds, then relax. Repeat this exercise 5-10 times with each   hand. Contact a health care provider if:  Your hand pain or discomfort gets much worse when you do an exercise.  Your hand pain or discomfort does not improve within 2 hours after you exercise. If you have any of these problems, stop doing these exercises right away. Do not do them again unless your health care provider says that you can. Get help right away if:  You develop sudden, severe hand pain or swelling. If this happens, stop doing these exercises right away. Do not do them again unless your health care provider says that you can. This information is not intended to replace advice given to you by your health care provider. Make sure you discuss any questions you have with your health care provider. Document Revised: 11/19/2018 Document Reviewed: 07/30/2018 [employer] Patient Education  [facility] [facility]. Elbow and Forearm Exercises Ask your health care provider which exercises are safe for you. Do exercises exactly as told by your health care provider and adjust them as directed. It is normal to feel mild stretching, pulling, tightness, or discomfort as you do these exercises. Stop right away if you feel sudden pain or your pain gets worse. Do not begin these exercises until told by your health care provider. Range-of-motion exercises These exercises warm up your muscles and joints and improve the movement and flexibility of your injured elbow and forearm. The exercises also help to relieve pain, numbness, and tingling. These exercises are done using the muscles in your injured elbow and forearm (active). Elbow flexion, active 6. Hold your left / right arm at your side, and bend your elbow (flexion) as far as you can using only your arm muscles. 7. Hold this position for __________ seconds. 8. Slowly return to the  starting position. Repeat __________ times. Complete this exercise __________ times a day. Elbow extension, active 5. Hold your left / right arm at your side, and straighten your elbow (extension) as much as you can using only your arm muscles. 6. Hold this position for __________ seconds. 7. Slowly return to the starting position. Repeat __________ times. Complete this exercise __________ times a day. Active forearm rotation, supination This is an exercise in which you turn (rotate) your forearm palm up (supination). 5. Stand or sit with your elbows at your sides. 6. Bend your left / right elbow to a 90-degree angle (right angle). 7. Rotate your palm up until you feel a gentle stretch on the inside of your forearm. 8. Hold this position for __________ seconds. 9. Slowly return to the starting position. Repeat __________ times. Complete this exercise __________ times a day. Active forearm rotation, pronation This is an exercise in which you turn (rotate) your forearm palm down (pronation). 5. Stand or sit with your elbows at your sides. 6. Bend your left / right elbow to a 90-degree angle (right angle). 7. Rotate your palm down until you feel a gentle stretch on the top of your forearm. 8. Hold this position for __________ seconds. 9. Slowly return to the starting position. Repeat __________ times. Complete this exercise __________ times a day. Stretching exercises These exercises warm up your muscles and joints and improve the movement and flexibility of your injured elbow and forearm. These exercises also help to relieve pain, numbness, and tingling. These exercises are done using your healthy elbow and forearm to help stretch the muscles in your injured elbow and forearm (active-assisted). Elbow flexion, active-assisted  5. Hold your left / right arm at your side, and bend your elbow (  flexion) as much as you can using your left / right arm muscles. 6. Use your other hand to bend your  left / right elbow farther. To do this, gently push up on your forearm until you feel a gentle stretch on the back of your elbow. 7. Hold this position for __________ seconds. 8. Slowly return to the starting position. Repeat __________ times. Complete this exercise __________ times a day. Elbow extension, active-assisted  7. Hold your left / right arm at your side, and straighten your elbow (extension) as much as you can using your left / right arm muscles. 8. Use your other hand to straighten the left / right elbow farther. To do this, gently push down on your forearm until you feel a gentle stretch on the inside of your elbow. 9. Hold this position for __________ seconds. 10. Slowly return to the starting position. Repeat __________ times. Complete this exercise __________ times a day. Active-assisted forearm rotation, supination This is an exercise in which you turn (rotate) your forearm palm up (supination). 6. Sit with your left / right elbow bent in a 90-degree angle (right angle) with your forearm resting on a table. 7. Keeping your upper body and shoulder still, rotate your forearm so your palm faces upward. 8. Use your other hand to help rotate your forearm further until you feel a gentle to moderate stretch. 9. Hold this position for __________ seconds. 10. Slowly release the stretch and return to the starting position. Repeat __________ times. Complete this exercise __________ times a day. Active-assisted forearm rotation, pronation This is an exercise in which you turn (rotate) your forearm palm down (pronation). 5. Sit with your left / right elbow bent in a 90-degree angle (right angle) with your forearm resting on a table. 6. Keeping your upper body and shoulder still, rotate your forearm so your palm faces the tabletop. 7. Use your other hand to help rotate your forearm further until you feel a gentle to moderate stretch. 8. Hold this position for __________  seconds. 9. Slowly release the stretch and return to the starting position. Repeat __________ times. Complete this exercise __________ times a day. Passive elbow flexion, supine 5. Lie on your back (supine position). 6. Extend your left / right arm up in the air, bracing it with your other hand. 7. Let your left / right hand slowly lower toward your shoulder (passive flexion), while your elbow stays pointed toward the ceiling. You should feel a gentle stretch along the back of your upper arm and elbow. 8. If instructed by your health care provider, you may increase the intensity of your stretch by adding a small wrist weight or hand weight. 9. Hold this position for __________ seconds. 10. Slowly return to the starting position. Repeat __________ times. Complete this exercise __________ times a day. Passive elbow extension, supine  5. Lie on your back (supine position). Make sure that you are in a comfortable position that lets you relax your arm muscles. 6. Place a folded towel under your left / right upper arm so your elbow and shoulder are at the same height. Straighten your left / right arm so your elbow does not rest on the bed or towel. 7. Let the weight of your hand stretch your elbow (passive extension). Keep your arm and chest muscles relaxed. You should feel a stretch on the inside of your elbow. 8. If told by your health care provider, you may increase the intensity of your stretch by adding a small  wrist weight or hand weight. 9. Hold this position for __________ seconds. 10. Slowly release the stretch. Repeat __________ times. Complete this exercise __________ times a day. Strengthening exercises These exercises build strength and endurance in your elbow and forearm. Endurance is the ability to use your muscles for a long time, even after they get tired. Elbow flexion, isometric  7. Stand or sit up straight. 8. Bend your left / right elbow in a 90-degree angle (right angle), and  keep your forearm at the height of your waist. Your thumb should be pointed toward the ceiling (neutral forearm). 9. Place your other hand on top of your left / right forearm. Gently push down while you resist with your left / right arm (isometric flexion). Push as hard as you can with both arms without causing any pain or movement at your left / right elbow. 10. Hold this position for __________ seconds. 11. Slowly release the tension in both arms. Let your muscles relax completely before you repeat the exercise. Repeat __________ times. Complete this exercise __________ times a day. Elbow extension, isometric  6. Stand or sit up straight. 7. Place your left / right arm so your palm faces your abdomen and is at the height of your waist. 8. Place your other hand on the underside of your left / right forearm. Gently push up while you resist with your left / right arm (isometric extension). Push as hard as you can with both arms without causing any pain or movement at your left / right elbow. 9. Hold this position for __________ seconds. 10. Slowly release the tension in both arms. Let your muscles relax completely before you repeat the exercise. Repeat __________ times. Complete this exercise __________ times a day. Elbow flexion with forearm palm up  1. Sit on a firm chair without armrests, or stand up. 2. Place your left / right arm at your side with your elbow straight and your palm facing forward. 3. Holding a __________weight or gripping a rubber exercise band or tubing, bend your elbow to bring your hand toward your shoulder (flexion). 4. Hold this position for __________ seconds. 5. Slowly return to the starting position. Repeat __________ times. Complete this exercise __________ times a day. Elbow extension, active  1. Sit on a firm chair without armrests, or stand up. 2. Hold a rubber exercise band or tubing in both hands. 3. Keeping your upper arms at your sides, bring both hands up  to your left / right shoulder. Keep your left / right hand just below your other hand. 4. Straighten your left / right elbow (extension) while keeping your other arm still. 5. Hold this position for __________ seconds. 6. Control the resistance of the band or tubing as you return to the starting position. Repeat __________ times. Complete this exercise __________ times a day. Forearm rotation, supination  1. Sit with your left / right forearm supported on a table. Your elbow should be at waist height and bent at a 90-degree angle (right angle). 2. Gently grasp a lightweight hammer. 3. Rest your hand over the edge of the table with your palm facing down. 4. Without moving your left / right elbow, slowly rotate your forearm to turn your palm up toward the ceiling (supination). 5. Hold this position for __________ seconds. 6. Slowly return to the starting position. Repeat __________ times. Complete this exercise __________ times a day. Forearm rotation, pronation  1. Sit with your left / right forearm supported on a table. Keep  your elbow below shoulder height. 2. Gently grasp a lightweight hammer. 3. Rest your hand over the edge of the table with your palm facing up. 4. Without moving your left / right elbow, slowly rotate your forearm to turn your palm down toward the floor (pronation). 5. Hold this position for __________seconds. 6. Slowly return to the starting position. Repeat __________ times. Complete this exercise __________ times a day. This information is not intended to replace advice given to you by your health care provider. Make sure you discuss any questions you have with your health care provider. Document Revised: 11/19/2018 Document Reviewed: 08/19/2018 Elsevier Patient Education  [place]. Back Exercises The following exercises strengthen the muscles that help to support the trunk and back. They also help to keep the lower back flexible. Doing these exercises can  help to prevent back pain or lessen existing pain.  If you have back pain or discomfort, try doing these exercises 2-3 times each day or as told by your health care provider.  As your pain improves, do them once each day, but increase the number of times that you repeat the steps for each exercise (do more repetitions).  To prevent the recurrence of back pain, continue to do these exercises once each day or as told by your health care provider. Do exercises exactly as told by your health care provider and adjust them as directed. It is normal to feel mild stretching, pulling, tightness, or discomfort as you do these exercises, but you should stop right away if you feel sudden pain or your pain gets worse. Exercises Single knee to chest Repeat these steps 3-5 times for each leg: 1. Lie on your back on a firm bed or the floor with your legs extended. 2. Bring one knee to your chest. Your other leg should stay extended and in contact with the floor. 3. Hold your knee in place by grabbing your knee or thigh with both hands and hold. 4. Pull on your knee until you feel a gentle stretch in your lower back or buttocks. 5. Hold the stretch for 10-30 seconds. 6. Slowly release and straighten your leg. Pelvic tilt Repeat these steps 5-10 times: 1. Lie on your back on a firm bed or the floor with your legs extended. 2. Bend your knees so they are pointing toward the ceiling and your feet are flat on the floor. 3. Tighten your lower abdominal muscles to press your lower back against the floor. This motion will tilt your pelvis so your tailbone points up toward the ceiling instead of pointing to your feet or the floor. 4. With gentle tension and even breathing, hold this position for 5-10 seconds. Cat-cow Repeat these steps until your lower back becomes more flexible: 1. Get into a hands-and-knees position on a firm surface. Keep your hands under your shoulders, and keep your knees under your hips. You  may place padding under your knees for comfort. 2. Let your head hang down toward your chest. Contract your abdominal muscles and point your tailbone toward the floor so your lower back becomes rounded like the back of a cat. 3. Hold this position for 5 seconds. 4. Slowly lift your head, let your abdominal muscles relax and point your tailbone up toward the ceiling so your back forms a sagging arch like the back of a cow. 5. Hold this position for 5 seconds.  Press-ups Repeat these steps 5-10 times: 1. Lie on your abdomen (face-down) on the floor. 2. Place  your palms near your head, about shoulder-width apart. 3. Keeping your back as relaxed as possible and keeping your hips on the floor, slowly straighten your arms to raise the top half of your body and lift your shoulders. Do not use your back muscles to raise your upper torso. You may adjust the placement of your hands to make yourself more comfortable. 4. Hold this position for 5 seconds while you keep your back relaxed. 5. Slowly return to lying flat on the floor.  Bridges Repeat these steps 10 times: 1. Lie on your back on a firm surface. 2. Bend your knees so they are pointing toward the ceiling and your feet are flat on the floor. Your arms should be flat at your sides, next to your body. 3. Tighten your buttocks muscles and lift your buttocks off the floor until your waist is at almost the same height as your knees. You should feel the muscles working in your buttocks and the back of your thighs. If you do not feel these muscles, slide your feet 1-2 inches farther away from your buttocks. 4. Hold this position for 3-5 seconds. 5. Slowly lower your hips to the starting position, and allow your buttocks muscles to relax completely. If this exercise is too easy, try doing it with your arms crossed over your chest. Abdominal crunches Repeat these steps 5-10 times: 1. Lie on your back on a firm bed or the floor with your legs  extended. 2. Bend your knees so they are pointing toward the ceiling and your feet are flat on the floor. 3. Cross your arms over your chest. 4. Tip your chin slightly toward your chest without bending your neck. 5. Tighten your abdominal muscles and slowly raise your trunk (torso) high enough to lift your shoulder blades a tiny bit off the floor. Avoid raising your torso higher than that because it can put too much stress on your low back and does not help to strengthen your abdominal muscles. 6. Slowly return to your starting position. Back lifts Repeat these steps 5-10 times: 1. Lie on your abdomen (face-down) with your arms at your sides, and rest your forehead on the floor. 2. Tighten the muscles in your legs and your buttocks. 3. Slowly lift your chest off the floor while you keep your hips pressed to the floor. Keep the back of your head in line with the curve in your back. Your eyes should be looking at the floor. 4. Hold this position for 3-5 seconds. 5. Slowly return to your starting position. Contact a health care provider if:  Your back pain or discomfort gets much worse when you do an exercise.  Your worsening back pain or discomfort does not lessen within 2 hours after you exercise. If you have any of these problems, stop doing these exercises right away. Do not do them again unless your health care provider says that you can. Get help right away if:  You develop sudden, severe back pain. If this happens, stop doing the exercises right away. Do not do them again unless your health care provider says that you can. This information is not intended to replace advice given to you by your health care provider. Make sure you discuss any questions you have with your health care provider. Document Revised: 12/03/2018 Document Reviewed: 04/30/2018 Elsevier Patient Education  [place].

## 2019-09-28 ENCOUNTER — Ambulatory Visit: Payer: Self-pay | Admitting: Rheumatology

## 2019-10-07 ENCOUNTER — Other Ambulatory Visit: Payer: Self-pay | Admitting: Cardiovascular Disease

## 2019-10-26 DIAGNOSIS — L570 Actinic keratosis: Secondary | ICD-10-CM | POA: Diagnosis not present

## 2019-10-26 DIAGNOSIS — R202 Paresthesia of skin: Secondary | ICD-10-CM | POA: Diagnosis not present

## 2019-10-28 ENCOUNTER — Other Ambulatory Visit: Payer: Self-pay | Admitting: Cardiovascular Disease

## 2019-10-31 ENCOUNTER — Other Ambulatory Visit: Payer: Self-pay | Admitting: Cardiovascular Disease

## 2019-12-07 ENCOUNTER — Other Ambulatory Visit: Payer: Self-pay

## 2019-12-07 MED ORDER — SIMVASTATIN 20 MG PO TABS: ORAL_TABLET | ORAL | 0 refills | Status: DC

## 2019-12-07 MED ORDER — AMLODIPINE BESYLATE 5 MG PO TABS: 5.0000 mg | ORAL_TABLET | Freq: Every day | ORAL | 0 refills | Status: DC

## 2019-12-09 ENCOUNTER — Other Ambulatory Visit: Payer: Self-pay

## 2019-12-13 ENCOUNTER — Telehealth: Payer: Self-pay | Admitting: Cardiovascular Disease

## 2019-12-13 NOTE — Telephone Encounter (Signed)
Pt c/o medication issue:  1. Name of Medication: amLODipine (NORVASC) 5 MG tablet/simvastatin (ZOCOR) 20 MG tablet  2. How are you currently taking this medication (dosage and times per day)? As directed  3. Are you having a reaction (difficulty breathing--STAT)? no  4. What is your medication issue? Patient needs the instructions about scheduling an appt with Dr. Burt Knack taken off so he can get 90 day refills. He states that his pharmacy keeps giving him 15-30 day refills. He scheduled an appt for 01/03/20 with Dr. Burt Knack. States he just got a 30 day refill so he is good for now.

## 2019-12-13 NOTE — Telephone Encounter (Signed)
Left message for patient that since he has enough to make it to his appointment, 90 day supplies for one year will be called in during that visit. Instructed him to call with questions or concerns prior to that appointment.

## 2019-12-14 DIAGNOSIS — H52223 Regular astigmatism, bilateral: Secondary | ICD-10-CM | POA: Diagnosis not present

## 2019-12-14 DIAGNOSIS — H524 Presbyopia: Secondary | ICD-10-CM | POA: Diagnosis not present

## 2019-12-14 DIAGNOSIS — H43813 Vitreous degeneration, bilateral: Secondary | ICD-10-CM | POA: Diagnosis not present

## 2019-12-14 DIAGNOSIS — H04123 Dry eye syndrome of bilateral lacrimal glands: Secondary | ICD-10-CM | POA: Diagnosis not present

## 2019-12-14 DIAGNOSIS — H353131 Nonexudative age-related macular degeneration, bilateral, early dry stage: Secondary | ICD-10-CM | POA: Diagnosis not present

## 2019-12-14 DIAGNOSIS — H2513 Age-related nuclear cataract, bilateral: Secondary | ICD-10-CM | POA: Diagnosis not present

## 2019-12-15 ENCOUNTER — Ambulatory Visit
Admission: EM | Admit: 2019-12-15 | Discharge: 2019-12-15 | Disposition: A | Discharge status: Home / Self Care | Payer: Medicare Other | Attending: Physician Assistant | Admitting: Physician Assistant

## 2019-12-15 DIAGNOSIS — Z5189 Encounter for other specified aftercare: Secondary | ICD-10-CM | POA: Diagnosis not present

## 2019-12-15 DIAGNOSIS — W57XXXA Bitten or stung by nonvenomous insect and other nonvenomous arthropods, initial encounter: Secondary | ICD-10-CM

## 2019-12-15 MED ORDER — DOXYCYCLINE HYCLATE 100 MG PO CAPS
100.0000 mg | ORAL_CAPSULE | Freq: Two times a day (BID) | ORAL | 0 refills | Status: DC
Start: 2019-12-15 — End: 2020-01-03

## 2019-12-15 NOTE — Discharge Instructions (Signed)
Apply triamcinolone cream on affected area for itching. Start doxycycline as directed. This will cover for skin infection, lyme disease, rocky mountain spotted fever. If noticing spreading redness, warmth, pain, fever, follow up for reevaluation.

## 2019-12-15 NOTE — ED Provider Notes (Signed)
[facility]    CSN: [id number] Arrival date & time: 12/15/19  1246      History   Chief Complaint Chief Complaint  Patient presents with  . Insect Bite    HPI [name] is a 79 y.o. male.   79 year old male comes in for 2 day history of swelling, itching pain to bite site after removing a tick. States area was itching in sensation. Now with increased swelling, still with itching, but mild pain as well. Area is warm to touch. Denies fever, chills, joint pain/body aches. However, has noticed mild headache. Apart from bite site, has not noticed other rashes.      Past Medical History:  Diagnosis Date  . Abnormal chest x-ray   . Allergic rhinitis   . Ankylosing spondylitis ([place])   . Anxiety   . Arthritis   . Colonic polyp   . Coronary atherosclerosis of native coronary artery   . Cough   . Dizziness 10/21/2016  . Ganglion cyst of wrist    left  . Heart attack ([place]) 2008  . Hemorrhoids   . History of malaria   . Hyperlipidemia    with low HDL  . Hypertension   . Kidney stone   . Other disorder of muscle, ligament, and fascia   . Rotator cuff tear   . Swelling of lower extremity 10/16/2015    Patient Active Problem List   Diagnosis Date Noted  . GERD (gastroesophageal reflux disease) 12/15/2017  . Unstable angina ([place]) 12/05/2017  . Angina at rest [facility]) 12/05/2017  . Atypical chest pain   . Dyslipidemia   . COPD not affecting current episode of care [facility])   . Essential hypertension 05/29/2017  . Abnormal nuclear cardiac imaging test 01/14/2017  . Bronchiectasis without complication ([place]) [id number]  . ILD (interstitial lung disease) ([place]) 11/27/2016  . Dizziness 10/21/2016  . Cough variant asthma  vs UACS 07/22/2016  . Obstructive sleep apnea 12/01/2015  . Acute bronchitis 11/30/2015  . Swelling of lower extremity 10/16/2015  . Benign mole 02/04/2014  . Impaired glucose tolerance 10/26/2013  . NAFLD (nonalcoholic fatty liver disease)  04/27/2013  . Thrombocytopenia ([place]) 10/21/2012  . Exertional angina ([facility]) 09/03/2012  . MALARIA 12/05/2009  . COLONIC POLYPS 12/05/2009  . HYPERCHOLESTEROLEMIA 12/05/2009  . ANXIETY 12/05/2009  . HEMORRHOIDS 12/05/2009  . Allergic rhinitis 12/05/2009  . GANGLION CYST, WRIST, LEFT 12/05/2009  . ROTATOR CUFF TEAR 12/05/2009  . OTHER DISORDER OF MUSCLE LIGAMENT AND FASCIA 12/05/2009  . ABNORMAL CHEST XRAY 12/05/2009  . CAD (coronary artery disease) 04/05/2009    Past Surgical History:  Procedure Laterality Date  . bicep rupture    . CORONARY ANGIOPLASTY WITH STENT PLACEMENT    . HEMORRHOID SURGERY  10/10   in [place]  . LEFT HEART CATH AND CORONARY ANGIOGRAPHY N/A 01/14/2017   Procedure: Left Heart Cath and Coronary Angiography;  Surgeon: [name], MD;  Location: [facility];  Service: Cardiovascular;  Laterality: N/A;  . LEFT HEART CATHETERIZATION WITH CORONARY ANGIOGRAM N/A 09/03/2012   Procedure: LEFT HEART CATHETERIZATION WITH CORONARY ANGIOGRAM;  Surgeon: [name], MD;  Location: [facility] CATH LAB;  Service: Cardiovascular;  Laterality: N/A;  . ROTATOR CUFF REPAIR     bilat by Dr. French Ana       Home Medications    Prior to Admission medications   Medication Sig Start Date End Date Taking? Authorizing Provider  amLODipine (NORVASC) 5 MG tablet Take 1 tablet (5 mg total)  by mouth daily. Please make overdue appt with Dr. Burt Knack before anymore refills. 2nd attempt 12/07/19   [name], MD  Apoaequorin (PREVAGEN PO) Take 1 tablet by mouth daily.     [provider]  aspirin 81 MG EC tablet Take 81 mg by mouth daily.     [provider]  budesonide-formoterol (SYMBICORT) 80-4.5 MCG/ACT inhaler Inhale 2 puffs into the lungs as needed.    [provider]  chlorpheniramine (CHLOR-TRIMETON) 4 MG tablet Take 4 mg by mouth 2 (two) times daily as needed for allergies.    [provider]  dextromethorphan-guaiFENesin (MUCINEX DM) 30-600 MG  12hr tablet Take 2 tablets by mouth 2 (two) times daily as needed for cough.    [provider]  doxycycline (VIBRAMYCIN) 100 MG capsule Take 1 capsule (100 mg total) by mouth 2 (two) times daily. 12/15/19   Tasia Catchings, Adison Reifsteck V, PA-C  famotidine (PEPCID) 20 MG tablet Take 20 mg by mouth at bedtime.    [provider]  Multiple Vitamins-Minerals (ICAPS AREDS 2) CAPS Take by mouth daily.    [provider]  pantoprazole (PROTONIX) 40 MG tablet TAKE 1 TABLET BY MOUTH ONCE DAILY 30-60 MINUTES BEFORE FIRST MEAL OF THE DAY 09/13/19   [name], MD  Respiratory Therapy Supplies (FLUTTER) DEVI by Does not apply route as directed.    [provider]  simvastatin (ZOCOR) 20 MG tablet TAKE 1 TABLET EVERY DAY WITH BREAKFAST. Please schedule annual appt with Dr. Burt Knack for refills. [phone]. 2nd attempt. 12/07/19   [name], MD  UNABLE TO FIND Med Name: CPAP per Dr Halford Chessman    [provider]    Family History Family History  Problem Relation Age of Onset  . Cancer Mother        melanoma  . Emphysema Mother   . Rheum arthritis Mother   . Diabetes Mother     Social History Social History   Tobacco Use  . Smoking status: Never Smoker  . Smokeless tobacco: Never Used  Substance Use Topics  . Alcohol use: Yes    Comment: holiday  . Drug use: No     Allergies   Celebrex [celecoxib] and Tramadol   Review of Systems Review of Systems  Reason unable to perform ROS: See HPI as above.     Physical Exam Triage Vital Signs ED Triage Vitals  Enc Vitals Group     BP 12/15/19 1252 134/72     Pulse Rate 12/15/19 1252 65     Resp 12/15/19 1252 16     Temp 12/15/19 1252 98.5 F (36.9 C)     Temp Source 12/15/19 1252 Oral     SpO2 12/15/19 1252 94 %     Weight --      Height --      Head Circumference --      Peak Flow --      Pain Score 12/15/19 1313 0     Pain Loc --      Pain Edu? --      Excl. in [place]? --    No data found.  Updated Vital  Signs BP 134/72 (BP Location: Left Arm)   Pulse 65   Temp 98.5 F (36.9 C) (Oral)   Resp 16   SpO2 94%   Visual Acuity Right Eye Distance:   Left Eye Distance:   Bilateral Distance:    Right Eye Near:   Left Eye Near:    Bilateral Near:  Physical Exam Constitutional:      General: He is not in acute distress.    Appearance: Normal appearance. He is well-developed. He is not toxic-appearing or diaphoretic.  HENT:     Head: Normocephalic and atraumatic.  Eyes:     Conjunctiva/sclera: Conjunctivae normal.     Pupils: Pupils are equal, round, and reactive to light.  Pulmonary:     Effort: Pulmonary effort is normal. No respiratory distress.     Comments: Speaking in full sentences without difficulty Musculoskeletal:     Cervical back: Normal range of motion and neck supple.  Skin:    General: Skin is warm and dry.     Comments: Swelling and erythema approx 1cm x 0.5cm to the left flank area with central wound from tick bite. Area is warm to touch. Minimal pain.   Neurological:     Mental Status: He is alert and oriented to person, place, and time.      UC Treatments / Results  Labs (all labs ordered are listed, but only abnormal results are displayed) Labs Reviewed - No data to display  EKG   Radiology No results found.  Procedures Procedures (including critical care time)  Medications Ordered in UC Medications - No data to display  Initial Impression / Assessment and Plan / UC Course  I have reviewed the triage vital signs and the nursing notes.  Pertinent labs & imaging results that were available during my care of the patient were reviewed by me and considered in my medical decision making (see chart for details).    Triamcinolone for itching. Otherwise start doxycycline to cover for infection. Discussed this will cover for tick borne diseases, though less suspicious at this time. Wound care instructions given. Return precautions given. Patient  expresses understanding and agrees to plan.  Final Clinical Impressions(s) / UC Diagnoses   Final diagnoses:  Visit for wound check  Tick bite, initial encounter    ED Prescriptions    Medication Sig Dispense Auth. Provider   doxycycline (VIBRAMYCIN) 100 MG capsule Take 1 capsule (100 mg total) by mouth 2 (two) times daily. 14 capsule Ok Edwards, PA-C     PDMP not reviewed this encounter.   Ok Edwards, PA-C 12/15/19 1617

## 2019-12-15 NOTE — ED Triage Notes (Signed)
Pt c/o tick bite to lt shoulder 2days ago.

## 2019-12-17 ENCOUNTER — Other Ambulatory Visit: Payer: Self-pay | Admitting: Cardiovascular Disease

## 2019-12-19 ENCOUNTER — Other Ambulatory Visit: Payer: Self-pay | Admitting: Internal Medicine

## 2019-12-29 ENCOUNTER — Other Ambulatory Visit: Payer: Self-pay

## 2020-01-03 ENCOUNTER — Encounter: Payer: Self-pay | Admitting: Cardiovascular Disease

## 2020-01-03 ENCOUNTER — Ambulatory Visit (INDEPENDENT_AMBULATORY_CARE_PROVIDER_SITE_OTHER): Payer: Medicare Other | Admitting: Cardiovascular Disease

## 2020-01-03 ENCOUNTER — Other Ambulatory Visit: Payer: Self-pay

## 2020-01-03 VITALS — BP 136/76 | HR 60 | Ht 67.0 in | Wt 181.6 lb

## 2020-01-03 DIAGNOSIS — I25119 Atherosclerotic heart disease of native coronary artery with unspecified angina pectoris: Secondary | ICD-10-CM | POA: Diagnosis not present

## 2020-01-03 DIAGNOSIS — E782 Mixed hyperlipidemia: Secondary | ICD-10-CM | POA: Diagnosis not present

## 2020-01-03 DIAGNOSIS — I2510 Atherosclerotic heart disease of native coronary artery without angina pectoris: Secondary | ICD-10-CM

## 2020-01-03 DIAGNOSIS — I10 Essential (primary) hypertension: Secondary | ICD-10-CM

## 2020-01-03 LAB — COMPREHENSIVE METABOLIC PANEL
ALT: 25 IU/L (ref 0–44)
AST: 24 IU/L (ref 0–40)
Albumin/Globulin Ratio: 2 (ref 1.2–2.2)
Albumin: 4.7 g/dL (ref 3.7–4.7)
Alkaline Phosphatase: 67 IU/L (ref 48–121)
BUN/Creatinine Ratio: 22 (ref 10–24)
BUN: 20 mg/dL (ref 8–27)
Bilirubin Total: 0.6 mg/dL (ref 0.0–1.2)
CO2: 23 mmol/L (ref 20–29)
Calcium: 10.1 mg/dL (ref 8.6–10.2)
Chloride: 101 mmol/L (ref 96–106)
Creatinine, Ser: 0.89 mg/dL (ref 0.76–1.27)
GFR calc Af Amer: 96 mL/min/{1.73_m2} (ref >59)
GFR calc non Af Amer: 83 mL/min/{1.73_m2} (ref >59)
Globulin, Total: 2.4 g/dL (ref 1.5–4.5)
Glucose: 133 mg/dL — ABNORMAL HIGH (ref 65–99)
Potassium: 4.6 mmol/L (ref 3.5–5.2)
Sodium: 141 mmol/L (ref 134–144)
Total Protein: 7.1 g/dL (ref 6.0–8.5)

## 2020-01-03 LAB — LIPID PANEL
Chol/HDL Ratio: 2.7 ratio (ref 0.0–5.0)
Cholesterol, Total: 108 mg/dL (ref 100–199)
HDL: 40 mg/dL (ref >39)
LDL Chol Calc (NIH): 51 mg/dL (ref 0–99)
Triglycerides: 85 mg/dL (ref 0–149)
VLDL Cholesterol Cal: 17 mg/dL (ref 5–40)

## 2020-01-03 NOTE — Progress Notes (Signed)
Cardiology Office Note:    Date:  01/03/2020   ID:  [name], DOB [birth year], MRN [id number]  PCP:  [name], MD  Cardiologist:  [name], MD  Electrophysiologist:  None   Referring MD: [name], MD   Chief Complaint  Patient presents with  . Coronary Artery Disease    History of Present Illness:    [name] is a 79 y.o. male with a hx of hypertension, mixed hyperlipidemia, and coronary artery disease.  The patient presented in 2008 with anterior MI and ventricular fibrillation cardiac arrest treated with stenting of the LAD.  Most recent heart catheterization in 2018 demonstrated continued patency of the LAD stent site with no other obstructive coronary disease.  He is here alone today. Reports an occasional pressure-like sensation in his chest. States that it is not like a 'pain.' It is not related to physical exertion. He is not particularly concerned about it. He has been sleeping more than in the past, taking afternoon naps. He has shortness of breath with heavy exertion. No orthopnea or PND. No leg edema reported other than sock line. 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   Past Medical History:  Diagnosis Date  . Abnormal chest x-ray   . Allergic rhinitis   . Ankylosing spondylitis ([place])   . Anxiety   . Arthritis   . Colonic polyp   . Coronary atherosclerosis of native coronary artery   . Cough   . Dizziness 10/21/2016  . Ganglion cyst of wrist    left  . Heart attack ([place]) 2008  . Hemorrhoids   . History of malaria   . Hyperlipidemia    with low HDL  . Hypertension   . Kidney stone   . Other disorder of muscle, ligament, and fascia   . Rotator cuff tear   . Swelling of lower extremity 10/16/2015    Past Surgical History:  Procedure Laterality Date  . bicep rupture    . CORONARY ANGIOPLASTY WITH STENT PLACEMENT    . HEMORRHOID SURGERY  10/10   in [place]  . LEFT HEART CATH AND CORONARY  ANGIOGRAPHY N/A 01/14/2017   Procedure: Left Heart Cath and Coronary Angiography;  Surgeon: [name], MD;  Location: [facility];  Service: Cardiovascular;  Laterality: N/A;  . LEFT HEART CATHETERIZATION WITH CORONARY ANGIOGRAM N/A 09/03/2012   Procedure: LEFT HEART CATHETERIZATION WITH CORONARY ANGIOGRAM;  Surgeon: [name], MD;  Location: [facility] CATH [facility];  Service: Cardiovascular;  Laterality: N/A;  . ROTATOR CUFF REPAIR     bilat by Dr. French Ana    Current Medications: Current Meds  Medication Sig  . amLODipine (NORVASC) 5 MG tablet Take 1 tablet (5 mg total) by mouth daily. Please keep upcoming appt with Dr. Burt Knack in May before anymore refills. Thank you  . Apoaequorin (PREVAGEN PO) Take 1 tablet by mouth daily.   [name] aspirin 81 MG EC tablet Take 81 mg by mouth daily.   . budesonide-formoterol (SYMBICORT) 80-4.5 MCG/ACT inhaler Inhale 2 puffs into the lungs as needed.  . chlorpheniramine (CHLOR-TRIMETON) 4 MG tablet Take 4 mg by mouth 2 (two) times daily as needed for allergies.  . famotidine (PEPCID) 20 MG tablet Take 20 mg by mouth at bedtime.  . Multiple Vitamins-Minerals (ICAPS AREDS 2) CAPS Take by mouth daily.  . pantoprazole (PROTONIX) 40 MG tablet TAKE 1 TABLET BY MOUTH ONCE DAILY 30-60 MINUTES BEFORE FIRST MEAL OF THE DAY  .  Respiratory Therapy Supplies (FLUTTER) DEVI by Does not apply route as directed.  . simvastatin (ZOCOR) 20 MG tablet TAKE 1 TABLET EVERY DAY WITH BREAKFAST. Please schedule annual appt with Dr. Burt Knack for refills. [phone]. 2nd attempt.  [name] UNABLE TO FIND Med Name: CPAP per Dr Halford Chessman     Allergies:   Celebrex [celecoxib] and Tramadol   Social History   Socioeconomic History  . Marital status: Married    Spouse name: [name]  . Number of children: 2  . Years of education: Not on file  . Highest education level: Not on file  Occupational History  . Not on file  Tobacco Use  . Smoking status: Never Smoker  . Smokeless tobacco: Never Used   Substance and Sexual Activity  . Alcohol use: Yes    Comment: holiday  . Drug use: No  . Sexual activity: Not on file  Other Topics Concern  . Not on file  Social History Narrative  . Not on file   Social Determinants of Health   Financial Resource Strain:   . Difficulty of Paying Living Expenses:   Food Insecurity:   . Worried About Charity fundraiser in the Last Year:   . Arboriculturist in the Last Year:   Transportation Needs:   . Film/video editor (Medical):   [name] Lack of Transportation (Non-Medical):   Physical Activity:   . Days of Exercise per Week:   . Minutes of Exercise per Session:   Stress:   . Feeling of Stress :   Social Connections:   . Frequency of Communication with Friends and Family:   . Frequency of Social Gatherings with Friends and Family:   . Attends Religious Services:   . Active Member of Clubs or Organizations:   . Attends Archivist Meetings:   [name] Marital Status:      Family History: The patient's family history includes Cancer in his mother; Diabetes in his mother; Emphysema in his mother; Rheum arthritis in his mother.  ROS:   Please see the history of present illness.    All other systems reviewed and are negative.  EKGs/Labs/Other Studies Reviewed:    The following studies were reviewed today: Cardiac catheterization 01/14/2017: Conclusion  1. Continued patency of the LAD stented segment with no significant restenosis 2. Severe stenosis of a small diagonal branch unchanged from the previous study in 2014 3. Mild nonobstructive disease of the right coronary artery and left circumflex 4. Normal LVEDP with known normal LV systolic function by noninvasive assessment  Recommendations: Continued medical therapy. I don't think the patient's diagonal stenosis is causing any symptoms and clearly wouldn't be related to his exertional dyspnea. It may however be the reason he has an abnormal stress ECG. Recommend ongoing medical  therapy with initiation of an exercise program. The diagonal is small and not well suited for PCI.   Coronary Findings  Diagnostic Dominance: Right Left Anterior Descending  Prox LAD to Mid LAD lesion 0% stenosed  Prox LAD to Mid LAD lesion with no stenosis was previously treated.  First Diagonal Branch  Vessel is small in size.  1st Diag lesion 75% stenosed  Small diagonal branch with severe stenosis unchanged from the previous study  Ramus Intermedius  Vessel is small. There is mild the vessel.  Left Circumflex  There is mild the vessel.  Right Coronary Artery  There is mild the vessel.  Mid RCA lesion 40% stenosed  There is mild stenosis unchanged from  the previous study  Intervention  No interventions have been documented. Coronary Diagrams  Diagnostic Dominance: Right   Echocardiogram 11/30/2015: Study Conclusions   - Left ventricle: The cavity size was normal. There was mild  concentric hypertrophy. Systolic function was vigorous. The  estimated ejection fraction was in the range of 65% to 70%. Wall  motion was normal; there were no regional wall motion  abnormalities. Doppler parameters are consistent with abnormal  left ventricular relaxation (grade 1 diastolic dysfunction).  Doppler parameters are consistent with elevated ventricular  end-diastolic filling pressure.  - Aortic valve: Trileaflet; normal thickness leaflets. There was no  regurgitation.  - Aortic root: The aortic root was normal in size.  - Mitral valve: Structurally normal valve.  - Left atrium: The atrium was mildly dilated.  - Right ventricle: The cavity size was normal. Wall thickness was  normal. Systolic function was normal.  - Right atrium: The atrium was normal in size.  - Tricuspid valve: There was no regurgitation.  - Pulmonic valve: There was no regurgitation.  - Pulmonary arteries: Systolic pressure was within the normal  range.  - Inferior vena cava: The vessel  was normal in size.  - Pericardium, extracardiac: There was no pericardial effusion.   EKG:  EKG is ordered today.  The ekg ordered today demonstrates normal sinus rhythm 60 bpm, within normal limits.  Recent Labs: 04/28/2019: Hemoglobin 15.1; Platelet Count 168  Recent Lipid Panel    Component Value Date/Time   CHOL 109 10/08/2018 1125   TRIG 67 10/08/2018 1125   HDL 44 10/08/2018 1125   CHOLHDL 2.5 10/08/2018 1125   CHOLHDL 2.8 12/18/2015 0831   VLDL 14 12/18/2015 0831   LDLCALC 52 10/08/2018 1125    Physical Exam:    VS:  BP 136/76   Pulse 60   Ht 5\' 7"  (1.702 m)   Wt 181 lb 9.6 oz (82.4 kg)   BMI 28.44 kg/m     Wt Readings from Last 3 Encounters:  01/03/20 181 lb 9.6 oz (82.4 kg)  09/24/19 176 lb 9.6 oz (80.1 kg)  07/29/19 175 lb (79.4 kg)     GEN:  Well nourished, well developed in no acute distress HEENT: Normal NECK: No JVD; No carotid bruits LYMPHATICS: No lymphadenopathy CARDIAC: RRR, no murmurs, rubs, gallops RESPIRATORY:  Clear to auscultation without rales, wheezing or rhonchi  ABDOMEN: Soft, non-tender, non-distended MUSCULOSKELETAL:  No edema; No deformity  SKIN: Warm and dry NEUROLOGIC:  Alert and oriented x 3 PSYCHIATRIC:  Normal affect   ASSESSMENT:    1. Coronary artery disease involving native coronary artery of native heart without angina pectoris   2. Essential hypertension   3. Mixed hyperlipidemia    PLAN:    In order of problems listed above:  1. The patient is stable with atypical chest pain symptoms.  With an absence of any exertional angina, I have recommended continued medical therapy with aspirin and a statin drug. 2. Blood pressure well controlled on amlodipine.  Explained that this may contribute to the mild edema he experiences at times in his legs.  Overall seems to be well-tolerated with blood pressure at goal. 3. Lipids have been excellent in the past, last checked in February 2020 at which time his total cholesterol was 109  and his LDL cholesterol was 52 mg/dL.  ALT was 23.  Will update fasting labs today.   Medication Adjustments/Labs and Tests Ordered: Current medicines are reviewed at length with the patient today.  Concerns regarding  medicines are outlined above.  No orders of the defined types were placed in this encounter.  No orders of the defined types were placed in this encounter.   There are no Patient Instructions on file for this visit.   Signed, [name], MD  01/03/2020 8:17 AM    [place]

## 2020-01-03 NOTE — Patient Instructions (Signed)
Medication Instructions:  Your provider recommends that you continue on your current medications as directed. Please refer to the Current Medication list given to you today.   *If you need a refill on your cardiac medications before your next appointment, please call your pharmacy*  Lab Work: TODAY! CMET, lipids If you have labs (blood work) drawn today and your tests are completely normal, you will receive your results only by: . MyChart Message (if you have MyChart) OR . A paper copy in the mail If you have any lab test that is abnormal or we need to change your treatment, we will call you to review the results.  Follow-Up: At [facility], you and your health needs are our priority.  As part of our continuing mission to provide you with exceptional heart care, we have created designated Provider Care Teams.  These Care Teams include your primary Cardiologist (physician) and Advanced Practice Providers (APPs -  Physician Assistants and Nurse Practitioners) who all work together to provide you with the care you need, when you need it. Your next appointment:   12 month(s) The format for your next appointment:   In Person Provider:   You may see Michael Cooper, MD or one of the following Advanced Practice Providers on your designated Care Team:    Scott Weaver, PA-C  Vin Bhagat, PA-C   

## 2020-01-11 ENCOUNTER — Other Ambulatory Visit: Payer: Self-pay | Admitting: Cardiovascular Disease

## 2020-02-25 ENCOUNTER — Encounter: Payer: Self-pay | Admitting: Family Medicine

## 2020-02-25 ENCOUNTER — Other Ambulatory Visit: Payer: Self-pay

## 2020-02-25 ENCOUNTER — Ambulatory Visit (INDEPENDENT_AMBULATORY_CARE_PROVIDER_SITE_OTHER): Payer: Medicare Other | Admitting: Family Medicine

## 2020-02-25 VITALS — BP 127/68 | HR 74 | Temp 97.8°F | Resp 15 | Ht 67.0 in | Wt 180.0 lb

## 2020-02-25 DIAGNOSIS — H9193 Unspecified hearing loss, bilateral: Secondary | ICD-10-CM

## 2020-02-25 DIAGNOSIS — H9313 Tinnitus, bilateral: Secondary | ICD-10-CM | POA: Diagnosis not present

## 2020-02-25 DIAGNOSIS — H6123 Impacted cerumen, bilateral: Secondary | ICD-10-CM | POA: Diagnosis not present

## 2020-02-25 DIAGNOSIS — I25119 Atherosclerotic heart disease of native coronary artery with unspecified angina pectoris: Secondary | ICD-10-CM | POA: Diagnosis not present

## 2020-02-25 DIAGNOSIS — K644 Residual hemorrhoidal skin tags: Secondary | ICD-10-CM

## 2020-02-25 DIAGNOSIS — Z0001 Encounter for general adult medical examination with abnormal findings: Secondary | ICD-10-CM | POA: Diagnosis not present

## 2020-02-25 DIAGNOSIS — R739 Hyperglycemia, unspecified: Secondary | ICD-10-CM | POA: Diagnosis not present

## 2020-02-25 DIAGNOSIS — Z0000 Encounter for general adult medical examination without abnormal findings: Secondary | ICD-10-CM

## 2020-02-25 NOTE — Patient Instructions (Addendum)
I will refer you to ENT - Dr. Redmond Baseman - to discuss hearing changes and ringing.  Ok to try triamcinolone additional 1-2 times per day if needed for itching. If area is not continuing to improve follow-up with me or your dermatologist I will refer you to general surgeon to evaluate the hemorrhoid Please follow-up to discuss various areas of aches or pains or can follow-up with rheumatology.  If other concerns we were unable to address today, please schedule another appointment.Thank you for coming today and take care.    Preventive Care 31 Years and Older, Male Preventive care refers to lifestyle choices and visits with your health care provider that can promote health and wellness. This includes:  A yearly physical exam. This is also called an annual well check.  Regular dental and eye exams.  Immunizations.  Screening for certain conditions.  Healthy lifestyle choices, such as diet and exercise. What can I expect for my preventive care visit? Physical exam Your health care provider will check:  Height and weight. These may be used to calculate body mass index (BMI), which is a measurement that tells if you are at a healthy weight.  Heart rate and blood pressure.  Your skin for abnormal spots. Counseling Your health care provider may ask you questions about:  Alcohol, tobacco, and drug use.  Emotional well-being.  Home and relationship well-being.  Sexual activity.  Eating habits.  History of falls.  Memory and ability to understand (cognition).  Work and work Statistician. What immunizations do I need?  Influenza (flu) vaccine  This is recommended every year. Tetanus, diphtheria, and pertussis (Tdap) vaccine  You may need a Td booster every 10 years. Varicella (chickenpox) vaccine  You may need this vaccine if you have not already been vaccinated. Zoster (shingles) vaccine  You may need this after age 52. Pneumococcal conjugate (PCV13) vaccine  One dose  is recommended after age 102. Pneumococcal polysaccharide (PPSV23) vaccine  One dose is recommended after age 73. Measles, mumps, and rubella (MMR) vaccine  You may need at least one dose of MMR if you were born in 1957 or later. You may also need a second dose. Meningococcal conjugate (MenACWY) vaccine  You may need this if you have certain conditions. Hepatitis A vaccine  You may need this if you have certain conditions or if you travel or work in places where you may be exposed to hepatitis A. Hepatitis B vaccine  You may need this if you have certain conditions or if you travel or work in places where you may be exposed to hepatitis B. Haemophilus influenzae type b (Hib) vaccine  You may need this if you have certain conditions. You may receive vaccines as individual doses or as more than one vaccine together in one shot (combination vaccines). Talk with your health care provider about the risks and benefits of combination vaccines. What tests do I need? Blood tests  Lipid and cholesterol levels. These may be checked every 5 years, or more frequently depending on your overall health.  Hepatitis C test.  Hepatitis B test. Screening  Lung cancer screening. You may have this screening every year starting at age 53 if you have a 30-pack-year history of smoking and currently smoke or have quit within the past 15 years.  Colorectal cancer screening. All adults should have this screening starting at age 11 and continuing until age 40. Your health care provider may recommend screening at age 31 if you are at increased risk. You will  have tests every 1-10 years, depending on your results and the type of screening test.  Prostate cancer screening. Recommendations will vary depending on your family history and other risks.  Diabetes screening. This is done by checking your blood sugar (glucose) after you have not eaten for a while (fasting). You may have this done every 1-3  years.  Abdominal aortic aneurysm (AAA) screening. You may need this if you are a current or former smoker.  Sexually transmitted disease (STD) testing. Follow these instructions at home: Eating and drinking  Eat a diet that includes fresh fruits and vegetables, whole grains, lean protein, and low-fat dairy products. Limit your intake of foods with high amounts of sugar, saturated fats, and salt.  Take vitamin and mineral supplements as recommended by your health care provider.  Do not drink alcohol if your health care provider tells you not to drink.  If you drink alcohol: ? Limit how much you have to 0-2 drinks a day. ? Be aware of how much alcohol is in your drink. In the [place] one drink equals one 12 oz bottle of beer (355 mL), one 5 oz glass of wine (148 mL), or one 1 oz glass of hard liquor (44 mL). Lifestyle  Take daily care of your teeth and gums.  Stay active. Exercise for at least 30 minutes on 5 or more days each week.  Do not use any products that contain nicotine or tobacco, such as cigarettes, e-cigarettes, and chewing tobacco. If you need help quitting, ask your health care provider.  If you are sexually active, practice safe sex. Use a condom or other form of protection to prevent STIs (sexually transmitted infections).  Talk with your health care provider about taking a low-dose aspirin or statin. What's next?  Visit your health care provider once a year for a well check visit.  Ask your health care provider how often you should have your eyes and teeth checked.  Stay up to date on all vaccines. This information is not intended to replace advice given to you by your health care provider. Make sure you discuss any questions you have with your health care provider. Document Revised: 07/23/2018 Document Reviewed: 07/23/2018 Elsevier Patient Education  [employer].    If you have lab work done today you will be contacted with your lab results within the  next 2 weeks.  If you have not heard from [place] then please contact us. The fastest way to get your results is to register for My Chart.   IF you received an x-ray today, you will receive an invoice from [facility]. Please contact [facility] at [phone] with questions or concerns regarding your invoice.   IF you received labwork today, you will receive an invoice from [facility]. Please contact [facility] at [phone] with questions or concerns regarding your invoice.   Our billing staff will not be able to assist you with questions regarding bills from these companies.  You will be contacted with the lab results as soon as they are available. The fastest way to get your results is to activate your My Chart account. Instructions are located on the last page of this paperwork. If you have not heard from [place] regarding the results in 2 weeks, please contact this office.

## 2020-02-25 NOTE — Progress Notes (Signed)
Subjective:  Patient ID: [name], male    DOB: 04-21-1941  Age: 79 y.o. MRN: [id number]  CC:  Chief Complaint  Patient presents with  . Annual Exam    pt has been struggling with ringing in both ears pt cannot say for how long many years, pt has a tick bite that was treated with Abx but there is still a lump that itches constantly.     HPI [name] presents for   Wellness exam and concerns as above.  Care team Primary care provider, me Cardiology Dr. Burt Knack, coronary artery disease, s/p ptca/stent.  Rheumatology Dr. Estanislado Pandy, various areas of osteoarthritis on last note reviewed. Elbow, knee, ankle pains - plans on separate visit. Question of AS in past, not diagnosed recently.treated by chirporacter in past with injections.  Hematology Dr. Earlie Server, thrombocytopenia Pulmonology Dr. Melvyn Novas, interstitial lung disease Derm: [name], every 6 months. precancerous lesions. No cancer.   Ringing in ears: For years, feels like worse over past 10 years.  Noise exposure in [employer] Has talked to the [place]. Has some forms to review. Has not seen ENT since 2015, for separate issue - Dr. Redmond Baseman.  Fitted for hearing aids 4-5 years ago, not wearing due to being outdoors a lot, aggravating. Some difficulty with hearing - getting worse.   Hemorrhoids: Has been seen by specialist in past, with banding in past.  One protruding past year, no recent changes. No pain.  Prior GI through [facility].  No surgeon. Wants removed.  Preparation H at times.    Tick bite: Note reviewed from urgent care on May 5th.  2-day history of swelling, itching and pain to a bite site after removing a tick, no systemic symptoms except possible mild headache.  No other rash.  Treated with triamcinolone for itching, doxycycline for infection 100 mg twice daily for 7 days. Still itching in area. Swelling has gone down, no recent discharge. Some improvement in past week. Notes more in evening.  Tx:  triamcinolone - once per day - mod relief.   Hyperglycemia: Glucose 133 on 01/03/20 - labs at cardiology. Thinks was fasting.  Glucose 107 in 10/08/18.  Lab Results  Component Value Date   HGBA1C 5.8 (H) 02/25/2020    Fall Risk  04/17/2018 05/19/2017 07/12/2016 06/24/2016 11/28/2015  Falls in the past year? _0   Comment - - - - -  Number falls in past yr: - - - - -  Injury with Fall? - - - - -   Depression screen [facility] 2/9 04/17/2018 05/19/2017 07/12/2016 06/24/2016 11/28/2015  Decreased Interest 0 0 0 0 0  Down, Depressed, Hopeless 0 0 0 0 0  PHQ - 2 Score 0 0 0 0 0  Some recent data might be hidden   Functional Status Survey: Is the patient deaf or have difficulty hearing?: Yes Does the patient have difficulty seeing, even when wearing glasses/contacts?: No Does the patient have difficulty concentrating, remembering, or making decisions?: Yes Does the patient have difficulty walking or climbing stairs?: No (SOB) Does the patient have difficulty dressing or bathing?: No Does the patient have difficulty doing errands alone such as visiting a doctor's office or shopping?: No  6CIT Screen 02/25/2020  What Year? 0 points  What month? 0 points  What time? 0 points  Count back from 20 0 points  Months in reverse 0 points  Repeat phrase 0 points  Total Score 0      Office Visit  from 02/25/2020 in Primary Care at [facility]  AUDIT-C Score 0     Immunization History  Administered Date(s) Administered  . Fluad Quad(high Dose 65+) 05/01/2019  . Influenza Split 06/01/2012  . Influenza Whole 05/30/2009, 04/20/2010  . Influenza, High Dose Seasonal PF 05/19/2017, 05/19/2018  . Influenza-Unspecified 07/12/2013, 05/10/2016  . PFIZER SARS-COV-2 Vaccination 08/25/2019, 09/15/2019  . Pneumococcal Conjugate-13 05/19/2018  . Pneumococcal Polysaccharide-23 08/22/2009  . Tdap 12/04/2010, 06/04/2011, 07/31/2018  . Zoster 05/17/2008    Hearing Screening   _0  _1  _2  _3  _4   _5  _6  _7  _8   Right ear:           Left ear:             Visual Acuity Screening   Right eye Left eye Both eyes  Without correction:     With correction: _9   Dental:every 6 months.   Activity/exercise: walking and staying active   Advanced directives: Has advanced directives  History Patient Active Problem List   Diagnosis Date Noted  . GERD (gastroesophageal reflux disease) 12/15/2017  . Unstable angina ([place]) 12/05/2017  . Angina at rest [facility]) 12/05/2017  . Atypical chest pain   . Dyslipidemia   . COPD not affecting current episode of care [facility])   . Essential hypertension 05/29/2017  . Abnormal nuclear cardiac imaging test 01/14/2017  . Bronchiectasis without complication ([place]) 08/65/7846  . ILD (interstitial lung disease) ([place]) 11/27/2016  . Dizziness 10/21/2016  . Cough variant asthma  vs UACS 07/22/2016  . Obstructive sleep apnea 12/01/2015  . Acute bronchitis 11/30/2015  . Swelling of lower extremity 10/16/2015  . Benign mole 02/04/2014  . Impaired glucose tolerance 10/26/2013  . NAFLD (nonalcoholic fatty liver disease) 04/27/2013  . Thrombocytopenia ([place]) 10/21/2012  . Exertional angina ([facility]) 09/03/2012  . MALARIA 12/05/2009  . COLONIC POLYPS 12/05/2009  . HYPERCHOLESTEROLEMIA 12/05/2009  . ANXIETY 12/05/2009  . HEMORRHOIDS 12/05/2009  . Allergic rhinitis 12/05/2009  . GANGLION CYST, WRIST, LEFT 12/05/2009  . ROTATOR CUFF TEAR 12/05/2009  . OTHER DISORDER OF MUSCLE LIGAMENT AND FASCIA 12/05/2009  . ABNORMAL CHEST XRAY 12/05/2009  . CAD (coronary artery disease) 04/05/2009   Past Medical History:  Diagnosis Date  . Abnormal chest x-ray   . Allergic rhinitis   . Allergy   . Ankylosing spondylitis ([place])   . Anxiety   . Arthritis   . Colonic polyp   . Coronary atherosclerosis of native coronary artery   . Cough   . Dizziness 10/21/2016  . Ganglion cyst of wrist    left  . Heart attack ([place]) 2008  . Hemorrhoids   .  History of malaria   . Hyperlipidemia    with low HDL  . Hypertension   . Kidney stone   . Other disorder of muscle, ligament, and fascia   . Rotator cuff tear   . Swelling of lower extremity 10/16/2015   Past Surgical History:  Procedure Laterality Date  . bicep rupture    . CORONARY ANGIOPLASTY WITH STENT PLACEMENT    . HEMORRHOID SURGERY  10/10   in [place]  . LEFT HEART CATH AND CORONARY ANGIOGRAPHY N/A 01/14/2017   Procedure: Left Heart Cath and Coronary Angiography;  Surgeon: [name], MD;  Location: [facility] CV LAB;  Service: Cardiovascular;  Laterality: N/A;  . LEFT HEART CATHETERIZATION WITH CORONARY ANGIOGRAM N/A 09/03/2012   Procedure: LEFT HEART CATHETERIZATION WITH CORONARY ANGIOGRAM;  Surgeon: [name], MD;  [facility] C[facility] LAB;  Service: Cardiovascular;  Laterality:  N/A;  . ROTATOR CUFF REPAIR     bilat by Dr. French Ana   Allergies  Allergen Reactions  . Celebrex [Celecoxib]     Pt states precipitated his heart attack.  . Tramadol Other (See Comments)    Makes patient jumpy   Prior to Admission medications   Medication Sig Start Date End Date Taking? Authorizing Provider  amLODipine (NORVASC) 5 MG tablet Take 1 tablet (5 mg total) by mouth daily. 01/12/20  Yes [name], MD  Apoaequorin (PREVAGEN PO) Take 1 tablet by mouth daily.    Yes [provider]  aspirin 81 MG EC tablet Take 81 mg by mouth daily.    Yes [provider]  budesonide-formoterol (SYMBICORT) 80-4.5 MCG/ACT inhaler Inhale 2 puffs into the lungs as needed.   Yes [provider]  famotidine (PEPCID) 20 MG tablet Take 20 mg by mouth at bedtime.   Yes [provider]  Multiple Vitamins-Minerals (ICAPS AREDS 2) CAPS Take by mouth daily.   Yes [provider]  pantoprazole (PROTONIX) 40 MG tablet TAKE 1 TABLET BY MOUTH ONCE DAILY 30-60 MINUTES BEFORE FIRST MEAL OF THE DAY 12/20/19  Yes [name], MD  simvastatin (ZOCOR) 20 MG tablet TAKE 1  TABLET EVERY DAY WITH BREAKFAST. Please schedule annual appt with Dr. Burt Knack for refills. [phone]. 2nd attempt. 12/07/19  Yes [name], MD  UNABLE TO FIND Med Name: CPAP per Dr Halford Chessman   Yes [provider]  chlorpheniramine (CHLOR-TRIMETON) 4 MG tablet Take 4 mg by mouth 2 (two) times daily as needed for allergies. Patient not taking: Reported on 02/25/2020    [provider]  Respiratory Therapy Supplies (FLUTTER) DEVI by Does not apply route as directed. Patient not taking: Reported on 02/25/2020    [provider]   Social History   Socioeconomic History  . Marital status: Married    Spouse name: [name]  . Number of children: 2  . Years of education: Not on file  . Highest education level: Not on file  Occupational History  . Not on file  Tobacco Use  . Smoking status: Never Smoker  . Smokeless tobacco: Never Used  Vaping Use  . Vaping Use: Never used  Substance and Sexual Activity  . Alcohol use: Yes    Comment: holiday Rare   . Drug use: No  . Sexual activity: Not Currently  Other Topics Concern  . Not on file  Social History Narrative  . Not on file   Social Determinants of Health   Financial Resource Strain:   . Difficulty of Paying Living Expenses:   Food Insecurity:   . Worried About Charity fundraiser in the Last Year:   . Arboriculturist in the Last Year:   Transportation Needs:   . Film/video editor (Medical):   [name] Lack of Transportation (Non-Medical):   Physical Activity:   . Days of Exercise per Week:   . Minutes of Exercise per Session:   Stress:   . Feeling of Stress :   Social Connections:   . Frequency of Communication with Friends and Family:   . Frequency of Social Gatherings with Friends and Family:   . Attends Religious Services:   . Active Member of Clubs or Organizations:   . Attends Archivist Meetings:   [name] Marital Status:   Intimate Partner Violence:   . Fear of Current or Ex-Partner:     . Emotionally Abused:   [name] Physically Abused:   .  Sexually Abused:     Review of Systems   Objective:   Vitals:   02/25/20 0921  BP: 127/68  Pulse: 74  Resp: 15  Temp: 97.8 F (36.6 C)  TempSrc: Temporal  SpO2: 98%  Weight: 180 lb (81.6 kg)  Height: _0  (1.702 m)     Physical Exam Vitals reviewed.  Constitutional:      Appearance: He is well-developed.  HENT:     Head: Normocephalic and atraumatic.     Right Ear: External ear normal. There is impacted cerumen.     Left Ear: External ear normal. There is impacted cerumen.  Eyes:     Conjunctiva/sclera: Conjunctivae normal.     Pupils: Pupils are equal, round, and reactive to light.  Neck:     Thyroid: No thyromegaly.  Cardiovascular:     Rate and Rhythm: Normal rate and regular rhythm.     Heart sounds: Normal heart sounds.  Pulmonary:     Effort: Pulmonary effort is normal. No respiratory distress.     Breath sounds: Normal breath sounds. No wheezing.  Abdominal:     General: There is no distension.     Palpations: Abdomen is soft.     Tenderness: There is no abdominal tenderness.  Genitourinary:    Rectum: External hemorrhoid (single, nonthombosed, no bleeding. ) present.  Musculoskeletal:        General: No tenderness. Normal range of motion.     Cervical back: Normal range of motion and neck supple.  Lymphadenopathy:     Cervical: No cervical adenopathy.  Skin:    General: Skin is warm and dry.     Comments: Healing abrasion/papule left chest wall/back. See photo.   Neurological:     Mental Status: He is alert and oriented to person, place, and time.     Deep Tendon Reflexes: Reflexes are normal and symmetric.  Psychiatric:        Behavior: Behavior normal.       Impaction resolved post lavage. Tm's pearly gray,  Canals clear.   Assessment & Plan:  JOHNRYAN SAO is a 79 y.o. male . Medicare annual wellness visit, subsequent  - - anticipatory guidance as below in AVS, screening labs if  needed. Health maintenance items as above in HPI discussed/recommended as applicable.  - no concerning responses on depression, fall, or functional status screening. Any positive responses noted as above. Advanced directives discussed as in [facility].   Tinnitus of both ears - Plan: Ambulatory referral to ENT Hearing difficulty of both ears - Plan: Ambulatory referral to ENT  -Longstanding tinnitus with some hearing difficulty, unsure if service related.  Will refer to ENT for testing/further discussion and review of paperwork if needed from [place].  I am also happy to see him back for that if needed.  Hyperglycemia - Plan: Hemoglobin A1c  Bilateral impacted cerumen - Plan: Ear wax removal  -Lavage performed bilaterally with resolution of symptoms, some improvement in hearing.  External hemorrhoid - Plan: Ambulatory referral to General Surgery  -Nonthrombosed, does want surgical evaluation for removal.  Referral placed  Prior tick bite area improving, triamcinolone as needed for itching, RTC precautions or dermatology eval if not continuing to resolve.  No orders of the defined types were placed in this encounter.  Patient Instructions    I will refer you to ENT - Dr. Redmond Baseman - to discuss hearing changes and ringing.  Ok to try triamcinolone additional 1-2 times per day if needed for itching. If area  is not continuing to improve follow-up with me or your dermatologist I will refer you to general surgeon to evaluate the hemorrhoid Please follow-up to discuss various areas of aches or pains or can follow-up with rheumatology.  If other concerns we were unable to address today, please schedule another appointment.Thank you for coming today and take care.    Preventive Care 32 Years and Older, Male Preventive care refers to lifestyle choices and visits with your health care provider that can promote health and wellness. This includes:  A yearly physical exam. This is also called an annual well  check.  Regular dental and eye exams.  Immunizations.  Screening for certain conditions.  Healthy lifestyle choices, such as diet and exercise. What can I expect for my preventive care visit? Physical exam Your health care provider will check:  Height and weight. These may be used to calculate body mass index (BMI), which is a measurement that tells if you are at a healthy weight.  Heart rate and blood pressure.  Your skin for abnormal spots. Counseling Your health care provider may ask you questions about:  Alcohol, tobacco, and drug use.  Emotional well-being.  Home and relationship well-being.  Sexual activity.  Eating habits.  History of falls.  Memory and ability to understand (cognition).  Work and work Statistician. What immunizations do I need?  Influenza (flu) vaccine  This is recommended every year. Tetanus, diphtheria, and pertussis (Tdap) vaccine  You may need a Td booster every 10 years. Varicella (chickenpox) vaccine  You may need this vaccine if you have not already been vaccinated. Zoster (shingles) vaccine  You may need this after age 69. Pneumococcal conjugate (PCV13) vaccine  One dose is recommended after age 68. Pneumococcal polysaccharide (PPSV23) vaccine  One dose is recommended after age 30. Measles, mumps, and rubella (MMR) vaccine  You may need at least one dose of MMR if you were born in 1957 or later. You may also need a second dose. Meningococcal conjugate (MenACWY) vaccine  You may need this if you have certain conditions. Hepatitis A vaccine  You may need this if you have certain conditions or if you travel or work in places where you may be exposed to hepatitis A. Hepatitis B vaccine  You may need this if you have certain conditions or if you travel or work in places where you may be exposed to hepatitis B. Haemophilus influenzae type b (Hib) vaccine  You may need this if you have certain conditions. You may receive  vaccines as individual doses or as more than one vaccine together in one shot (combination vaccines). Talk with your health care provider about the risks and benefits of combination vaccines. What tests do I need? Blood tests  Lipid and cholesterol levels. These may be checked every 5 years, or more frequently depending on your overall health.  Hepatitis C test.  Hepatitis B test. Screening  Lung cancer screening. You may have this screening every year starting at age 67 if you have a 30-pack-year history of smoking and currently smoke or have quit within the past 15 years.  Colorectal cancer screening. All adults should have this screening starting at age 82 and continuing until age 32. Your health care provider may recommend screening at age 52 if you are at increased risk. You will have tests every 1-10 years, depending on your results and the type of screening test.  Prostate cancer screening. Recommendations will vary depending on your family history and other risks.  Diabetes screening. This is done by checking your blood sugar (glucose) after you have not eaten for a while (fasting). You may have this done every 1-3 years.  Abdominal aortic aneurysm (AAA) screening. You may need this if you are a current or former smoker.  Sexually transmitted disease (STD) testing. Follow these instructions at home: Eating and drinking  Eat a diet that includes fresh fruits and vegetables, whole grains, lean protein, and low-fat dairy products. Limit your intake of foods with high amounts of sugar, saturated fats, and salt.  Take vitamin and mineral supplements as recommended by your health care provider.  Do not drink alcohol if your health care provider tells you not to drink.  If you drink alcohol: ? Limit how much you have to 0-2 drinks a day. ? Be aware of how much alcohol is in your drink. In the [place] one drink equals one 12 oz bottle of beer (355 mL), one 5 oz glass of wine (148 mL),  or one 1 oz glass of hard liquor (44 mL). Lifestyle  Take daily care of your teeth and gums.  Stay active. Exercise for at least 30 minutes on 5 or more days each week.  Do not use any products that contain nicotine or tobacco, such as cigarettes, e-cigarettes, and chewing tobacco. If you need help quitting, ask your health care provider.  If you are sexually active, practice safe sex. Use a condom or other form of protection to prevent STIs (sexually transmitted infections).  Talk with your health care provider about taking a low-dose aspirin or statin. What's next?  Visit your health care provider once a year for a well check visit.  Ask your health care provider how often you should have your eyes and teeth checked.  Stay up to date on all vaccines. This information is not intended to replace advice given to you by your health care provider. Make sure you discuss any questions you have with your health care provider. Document Revised: 07/23/2018 Document Reviewed: 07/23/2018 Elsevier Patient Education  [employer].    If you have lab work done today you will be contacted with your lab results within the next 2 weeks.  If you have not heard from [place] then please contact us. The fastest way to get your results is to register for My Chart.   IF you received an x-ray today, you will receive an invoice from [facility]. Please contact [facility] at [phone] with questions or concerns regarding your invoice.   IF you received labwork today, you will receive an invoice from [facility]. Please contact [facility] at [phone] with questions or concerns regarding your invoice.   Our billing staff will not be able to assist you with questions regarding bills from these companies.  You will be contacted with the lab results as soon as they are available. The fastest way to get your results is to activate your My Chart account. Instructions are located on the last  page of this paperwork. If you have not heard from [place] regarding the results in 2 weeks, please contact this office.         Signed, Merri Ray, MD [facility]

## 2020-02-26 ENCOUNTER — Encounter: Payer: Self-pay | Admitting: Family Medicine

## 2020-02-26 LAB — HEMOGLOBIN A1C
Est. average glucose Bld gHb Est-mCnc: 120 mg/dL
Hgb A1c MFr Bld: 5.8 % — ABNORMAL HIGH (ref 4.8–5.6)

## 2020-03-09 DIAGNOSIS — H9313 Tinnitus, bilateral: Secondary | ICD-10-CM | POA: Diagnosis not present

## 2020-03-09 DIAGNOSIS — H903 Sensorineural hearing loss, bilateral: Secondary | ICD-10-CM | POA: Diagnosis not present

## 2020-03-09 DIAGNOSIS — H838X3 Other specified diseases of inner ear, bilateral: Secondary | ICD-10-CM | POA: Diagnosis not present

## 2020-03-10 ENCOUNTER — Other Ambulatory Visit: Payer: Self-pay

## 2020-03-10 MED ORDER — SIMVASTATIN 20 MG PO TABS: ORAL_TABLET | ORAL | 2 refills | Status: DC

## 2020-03-14 ENCOUNTER — Other Ambulatory Visit: Payer: Self-pay | Admitting: Internal Medicine

## 2020-03-14 MED ORDER — PANTOPRAZOLE SODIUM 40 MG PO TBEC: DELAYED_RELEASE_TABLET | ORAL | 1 refills | Status: DC

## 2020-04-10 ENCOUNTER — Ambulatory Visit (INDEPENDENT_AMBULATORY_CARE_PROVIDER_SITE_OTHER): Payer: Medicare Other | Admitting: Family Medicine

## 2020-04-10 ENCOUNTER — Other Ambulatory Visit: Payer: Self-pay

## 2020-04-10 ENCOUNTER — Encounter: Payer: Self-pay | Admitting: Family Medicine

## 2020-04-10 VITALS — BP 122/67 | HR 67 | Temp 98.3°F | Ht 67.0 in | Wt 175.0 lb

## 2020-04-10 DIAGNOSIS — R7303 Prediabetes: Secondary | ICD-10-CM | POA: Diagnosis not present

## 2020-04-10 DIAGNOSIS — I25119 Atherosclerotic heart disease of native coronary artery with unspecified angina pectoris: Secondary | ICD-10-CM

## 2020-04-10 DIAGNOSIS — L299 Pruritus, unspecified: Secondary | ICD-10-CM | POA: Diagnosis not present

## 2020-04-10 DIAGNOSIS — H9313 Tinnitus, bilateral: Secondary | ICD-10-CM | POA: Diagnosis not present

## 2020-04-10 DIAGNOSIS — W57XXXA Bitten or stung by nonvenomous insect and other nonvenomous arthropods, initial encounter: Secondary | ICD-10-CM | POA: Diagnosis not present

## 2020-04-10 NOTE — Patient Instructions (Addendum)
[facility] with [facility] or [facility] to determine if service related.   Gold Bond or other over the counter powder as needed throughout the day to groin, but make sure area of groin is dry after bathing - use cool setting on hair dryer. If rash in area - return for other treatment.   Try insect repellant to see if that stops the bumps on skin. Ok to use steroid cream up to twice per day.   http://trevino.com/, or NCDHHS.gov for reliable information about Covid 19.   We did send a referral to surgery in July about the hemorrhoids. Call them to schedule appointment: [facility].   Prediabetes Prediabetes is the condition of having a blood sugar (blood glucose) level that is higher than it should be, but not high enough for you to be diagnosed with type 2 diabetes. Having prediabetes puts you at risk for developing type 2 diabetes (type 2 diabetes mellitus). Prediabetes may be called impaired glucose tolerance or impaired fasting glucose. Prediabetes usually does not cause symptoms. Your health care provider can diagnose this condition with blood tests. You may be tested for prediabetes if you are overweight and if you have at least one other risk factor for prediabetes. What is blood glucose, and how is it measured? Blood glucose refers to the amount of glucose in your bloodstream. Glucose comes from eating foods that contain sugars and starches (carbohydrates), which the body breaks down into glucose. Your blood glucose level may be measured in mg/dL (milligrams per deciliter) or mmol/L (millimoles per liter). Your blood glucose may be checked with one or more of the following blood tests:  A fasting blood glucose (FBG) test. You will not be allowed to eat (you will fast) for 8 hours or longer before a blood sample is taken. ? A normal range for FBG is 70-100 mg/dl (3.9-5.6 mmol/L).  An A1c (hemoglobin A1c) blood test. This test provides information about blood glucose control over the  previous 2?49months.  An oral glucose tolerance test (OGTT). This test measures your blood glucose at two times: ? After fasting. This is your baseline level. ? Two hours after you drink a beverage that contains glucose. You may be diagnosed with prediabetes:  If your FBG is 100?125 mg/dL (5.6-6.9 mmol/L).  If your A1c level is 5.7?6.4%.  If your OGTT result is 140?199 mg/dL (7.8-11 mmol/L). These blood tests may be repeated to confirm your diagnosis. How can this condition affect me? The pancreas produces a hormone (insulin) that helps to move glucose from the bloodstream into cells. When cells in the body do not respond properly to insulin that the body makes (insulin resistance), excess glucose builds up in the blood instead of going into cells. As a result, high blood glucose (hyperglycemia) can develop, which can cause many complications. Hyperglycemia is a symptom of prediabetes. Having high blood glucose for a long time is dangerous. Too much glucose in your blood can damage your nerves and blood vessels. Long-term damage can lead to complications from diabetes, which may include:  Heart disease.  Stroke.  Blindness.  Kidney disease.  Depression.  Poor circulation in the feet and legs, which could lead to surgical removal (amputation) in severe cases. What can increase my risk? Risk factors for prediabetes include:  Having a family member with type 2 diabetes.  Being overweight or obese.  Being older than age 48.  Being of American [place], African-American, Hispanic/Latino, or Asian/Pacific Islander descent.  Having an inactive (sedentary) lifestyle.  Having a history of heart disease.  History of gestational diabetes or polycystic ovary syndrome (PCOS), in women.  Having low levels of good cholesterol (HDL-C) or high levels of blood fats (triglycerides).  Having high blood pressure. What actions can I take to prevent diabetes?      Be physically  active. ? Do moderate-intensity physical activity for 30 or more minutes on 5 or more days of the week, or as much as told by your health care provider. This could be brisk walking, biking, or water aerobics. ? Ask your health care provider what activities are safe for you. A mix of physical activities may be best, such as walking, swimming, cycling, and strength training.  Lose weight as told by your health care provider. ? Losing 5-7% of your body weight can reverse insulin resistance. ? Your health care provider can determine how much weight loss is best for you and can help you lose weight safely.  Follow a healthy meal plan. This includes eating lean proteins, complex carbohydrates, fresh fruits and vegetables, low-fat dairy products, and healthy fats. ? Follow instructions from your health care provider about eating or drinking restrictions. ? Make an appointment to see a diet and nutrition specialist (registered dietitian) to help you create a healthy eating plan that is right for you.  Do not smoke or use any tobacco products, such as cigarettes, chewing tobacco, and e-cigarettes. If you need help quitting, ask your health care provider.  Take over-the-counter and prescription medicines as told by your health care provider. You may be prescribed medicines that help lower the risk of type 2 diabetes.  Keep all follow-up visits as told by your health care provider. This is important. Summary  Prediabetes is the condition of having a blood sugar (blood glucose) level that is higher than it should be, but not high enough for you to be diagnosed with type 2 diabetes.  Having prediabetes puts you at risk for developing type 2 diabetes (type 2 diabetes mellitus).  To help prevent type 2 diabetes, make lifestyle changes such as being physically active and eating a healthy diet. Lose weight as told by your health care provider. This information is not intended to replace advice given to you by  your health care provider. Make sure you discuss any questions you have with your health care provider. Document Revised: 11/20/2018 Document Reviewed: 09/19/2015 Elsevier Patient Education  [employer].    If you have lab work done today you will be contacted with your lab results within the next 2 weeks.  If you have not heard from [place] then please contact us. The fastest way to get your results is to register for My Chart.   IF you received an x-ray today, you will receive an invoice from [facility]. Please contact [facility] at [phone] with questions or concerns regarding your invoice.   IF you received labwork today, you will receive an invoice from [facility]. Please contact [facility] at [phone] with questions or concerns regarding your invoice.   Our billing staff will not be able to assist you with questions regarding bills from these companies.  You will be contacted with the lab results as soon as they are available. The fastest way to get your results is to activate your My Chart account. Instructions are located on the last page of this paperwork. If you have not heard from [place] regarding the results in 2 weeks, please contact this office.

## 2020-04-10 NOTE — Progress Notes (Signed)
Subjective:  Patient ID: [name], male    DOB: [birth year]  Age: 79 y.o. MRN: [id number]  CC:  Chief Complaint  Patient presents with  . Follow-up    on lab work, referrals, and skin iritation. Pt had a refferal to an ENT and to have his hemroids removed. pt saw ENT and stats they just wanted to sell him somthing for his ears that he wasn't interested in. pt reports he never got a call for the hemroid removal. Pt reports he has more of the skin iritations from last month. pt would also like a recomendation for a powder that the pt can use to keep his scrotum from sticking to his legs.    HPI [name] presents for   Follow-up from July 16. Referred to ENT for tinnitus.  Visit with Rometta Emery, PA-C at Dr. Deeann Saint office on August 13.  Audiometry indicated bilateral severe high-frequency sensorineural hearing loss with slight asymmetry on the left.  Consistent with presbycusis and noise exposure.  Small possibility of acoustic neuroma or retrocochlear lesion discussed, option of MRI, plan for conservative observation for now.  Hearing aid options were discussed.  Atrovent nasal spray as needed for drainage.  40-month follow-up.  Referred to general surgery July 16 for hemorrhoid evaluation.  Prediabetes Plan for diet/exercise approach.  Recheck in 3 to 6 months. Some change in diet since last visit. Less carbs.  Wt Readings from Last 3 Encounters:  04/10/20 175 lb (79.4 kg)  02/25/20 180 lb (81.6 kg)  01/03/20 181 lb 9.6 oz (82.4 kg)   Lab Results  Component Value Date   HGBA1C 5.8 (H) 02/25/2020   Sweating in groin: No rash, just skin sticking together in groin. Not using powder. Wears briefs.   Bumps on skin: Various areas in past - some on legs, arms at times.  Forms small bump or pustule.  Uses triamcinolone cream from last visit.  Only using sporadically, not BID  No new soap/cleansers. Uses Dove soap.  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 No  interdigital, mouth, or genital lesions.     History Patient Active Problem List   Diagnosis Date Noted  . GERD (gastroesophageal reflux disease) 12/15/2017  . Unstable angina ([place]) 12/05/2017  . Angina at rest [facility]) 12/05/2017  . Atypical chest pain   . Dyslipidemia   . COPD not affecting current episode of care [facility])   . Essential hypertension 05/29/2017  . Abnormal nuclear cardiac imaging test 01/14/2017  . Bronchiectasis without complication ([place]) 83/66/2947  . ILD (interstitial lung disease) ([place]) 11/27/2016  . Dizziness 10/21/2016  . Cough variant asthma  vs UACS 07/22/2016  . Obstructive sleep apnea 12/01/2015  . Acute bronchitis 11/30/2015  . Swelling of lower extremity 10/16/2015  . Benign mole 02/04/2014  . Impaired glucose tolerance 10/26/2013  . NAFLD (nonalcoholic fatty liver disease) 04/27/2013  . Thrombocytopenia ([place]) 10/21/2012  . Exertional angina ([facility]) 09/03/2012  . MALARIA 12/05/2009  . COLONIC POLYPS 12/05/2009  . HYPERCHOLESTEROLEMIA 12/05/2009  . ANXIETY 12/05/2009  . HEMORRHOIDS 12/05/2009  . Allergic rhinitis 12/05/2009  . GANGLION CYST, WRIST, LEFT 12/05/2009  . ROTATOR CUFF TEAR 12/05/2009  . OTHER DISORDER OF MUSCLE LIGAMENT AND FASCIA 12/05/2009  . ABNORMAL CHEST XRAY 12/05/2009  . CAD (coronary artery disease) 04/05/2009   Past Medical History:  Diagnosis Date  . Abnormal chest x-ray   . Allergic rhinitis   . Allergy   . Ankylosing spondylitis ([place])   . Anxiety   .  Arthritis   . Colonic polyp   . Coronary atherosclerosis of native coronary artery   . Cough   . Dizziness 10/21/2016  . Ganglion cyst of wrist    left  . Heart attack ([place]) 2008  . Hemorrhoids   . History of malaria   . Hyperlipidemia    with low HDL  . Hypertension   . Kidney stone   . Other disorder of muscle, ligament, and fascia   . Rotator cuff tear   . Swelling of lower extremity 10/16/2015   Past Surgical History:  Procedure Laterality Date  . bicep  rupture    . CORONARY ANGIOPLASTY WITH STENT PLACEMENT    . HEMORRHOID SURGERY  10/10   in [place]  . LEFT HEART CATH AND CORONARY ANGIOGRAPHY N/A 01/14/2017   Procedure: Left Heart Cath and Coronary Angiography;  Surgeon: [name], MD;  Location: [facility];  Service: Cardiovascular;  Laterality: N/A;  . LEFT HEART CATHETERIZATION WITH CORONARY ANGIOGRAM N/A 09/03/2012   Procedure: LEFT HEART CATHETERIZATION WITH CORONARY ANGIOGRAM;  Surgeon: [name], MD;  Location: [facility] C[facility] LAB;  Service: Cardiovascular;  Laterality: N/A;  . ROTATOR CUFF REPAIR     bilat by Dr. French Ana   Allergies  Allergen Reactions  . Celebrex [Celecoxib]     Pt states precipitated his heart attack.  . Tramadol Other (See Comments)    Makes patient jumpy   Prior to Admission medications   Medication Sig Start Date End Date Taking? Authorizing Provider  amLODipine (NORVASC) 5 MG tablet Take 1 tablet (5 mg total) by mouth daily. 01/12/20  Yes [name], MD  Apoaequorin (PREVAGEN PO) Take 1 tablet by mouth daily.    Yes [provider]  aspirin 81 MG EC tablet Take 81 mg by mouth daily.    Yes [provider]  budesonide-formoterol (SYMBICORT) 80-4.5 MCG/ACT inhaler Inhale 2 puffs into the lungs as needed.   Yes [provider]  famotidine (PEPCID) 20 MG tablet Take 20 mg by mouth at bedtime.   Yes [provider]  Multiple Vitamins-Minerals (ICAPS AREDS 2) CAPS Take by mouth daily.   Yes [provider]  pantoprazole (PROTONIX) 40 MG tablet TAKE 1 TABLET BY MOUTH ONCE DAILY 30-60 MINUTES BEFORE FIRST MEAL OF THE DAY 03/14/20  Yes [name], MD  simvastatin (ZOCOR) 20 MG tablet TAKE 1 TABLET EVERY DAY WITH BREAKFAST. Please schedule annual appt with Dr. Burt Knack for refills. [phone]. 2nd attempt. 03/10/20  Yes [name], MD  UNABLE TO FIND Med Name: CPAP per Dr Halford Chessman   Yes [provider]  chlorpheniramine (CHLOR-TRIMETON) 4 MG tablet Take  4 mg by mouth 2 (two) times daily as needed for allergies. Patient not taking: Reported on 02/25/2020    [provider]  Respiratory Therapy Supplies (FLUTTER) DEVI by Does not apply route as directed. Patient not taking: Reported on 02/25/2020    [provider]   Social History   Socioeconomic History  . Marital status: Married    Spouse name: [name]  . Number of children: 2  . Years of education: Not on file  . Highest education level: Not on file  Occupational History  . Not on file  Tobacco Use  . Smoking status: Never Smoker  . Smokeless tobacco: Never Used  Vaping Use  . Vaping Use: Never used  Substance and Sexual Activity  . Alcohol use: Yes    Comment: holiday Rare   . Drug use: No  .  Sexual activity: Not Currently  Other Topics Concern  . Not on file  Social History Narrative  . Not on file   Social Determinants of Health   Financial Resource Strain:   . Difficulty of Paying Living Expenses: Not on file  Food Insecurity:   . Worried About Charity fundraiser in the Last Year: Not on file  . Ran Out of Food in the Last Year: Not on file  Transportation Needs:   . Lack of Transportation (Medical): Not on file  . Lack of Transportation (Non-Medical): Not on file  Physical Activity:   . Days of Exercise per Week: Not on file  . Minutes of Exercise per Session: Not on file  Stress:   . Feeling of Stress : Not on file  Social Connections:   . Frequency of Communication with Friends and Family: Not on file  . Frequency of Social Gatherings with Friends and Family: Not on file  . Attends Religious Services: Not on file  . Active Member of Clubs or Organizations: Not on file  . Attends Archivist Meetings: Not on file  . Marital Status: Not on file  Intimate Partner Violence:   . Fear of Current or Ex-Partner: Not on file  . Emotionally Abused: Not on file  . Physically Abused: Not on file  . Sexually Abused: Not on file     Review of Systems Per HPI  Objective:   Vitals:   04/10/20 0826  BP: 122/67  Pulse: 67  Temp: 98.3 F (36.8 C)  TempSrc: Temporal  SpO2: 94%  Weight: 175 lb (79.4 kg)  Height: 5\' 7"  (1.702 m)     Physical Exam Vitals reviewed.  Constitutional:      General: He is not in acute distress.    Appearance: He is well-developed.  HENT:     Head: Normocephalic and atraumatic.  Cardiovascular:     Rate and Rhythm: Normal rate.  Pulmonary:     Effort: Pulmonary effort is normal.  Neurological:     Mental Status: He is alert and oriented to person, place, and time.    few small excoriated papules on legs, arms, no interdigital lesions or surrounding erythema.   39 minutes spent during visit, greater than 50% counseling and assimilation of information, chart review, and discussion of plan.    Assessment & Plan:  [name] is a 79 y.o. male . Prediabetes  - commended on diet change. Recheck 28months.   Insect bite, unspecified site, initial encounter Itching  - insect repellant, triamcinolone as needed. rtc precautions.   Tinnitus of both ears  - ENT eval noted. [facility] with [facility] to determine [facility]. Hearing aids per ENT/follow up with ENT if needed.   Drying of groin after bathing, drying powder if needed. rtc precautions with s/sx of tinea cruris.   No orders of the defined types were placed in this encounter.  Patient Instructions   Discuss [facility] with [facility] or [facility] to determine if service related.   Gold Bond or other over the counter powder as needed throughout the day to groin, but make sure area of groin is dry after bathing - use cool setting on hair dryer. If rash in area - return for other treatment.   Try insect repellant to see if that stops the bumps on skin. Ok to use steroid cream up to twice per day.   http://trevino.com/, or NCDHHS.gov for reliable information about Covid 19.   We  did send a referral to surgery in July about the  hemorrhoids. Call them to schedule appointment: [facility].   Prediabetes Prediabetes is the condition of having a blood sugar (blood glucose) level that is higher than it should be, but not high enough for you to be diagnosed with type 2 diabetes. Having prediabetes puts you at risk for developing type 2 diabetes (type 2 diabetes mellitus). Prediabetes may be called impaired glucose tolerance or impaired fasting glucose. Prediabetes usually does not cause symptoms. Your health care provider can diagnose this condition with blood tests. You may be tested for prediabetes if you are overweight and if you have at least one other risk factor for prediabetes. What is blood glucose, and how is it measured? Blood glucose refers to the amount of glucose in your bloodstream. Glucose comes from eating foods that contain sugars and starches (carbohydrates), which the body breaks down into glucose. Your blood glucose level may be measured in mg/dL (milligrams per deciliter) or mmol/L (millimoles per liter). Your blood glucose may be checked with one or more of the following blood tests:  A fasting blood glucose (FBG) test. You will not be allowed to eat (you will fast) for 8 hours or longer before a blood sample is taken. ? A normal range for FBG is 70-100 mg/dl (3.9-5.6 mmol/L).  An A1c (hemoglobin A1c) blood test. This test provides information about blood glucose control over the previous 2?16months.  An oral glucose tolerance test (OGTT). This test measures your blood glucose at two times: ? After fasting. This is your baseline level. ? Two hours after you drink a beverage that contains glucose. You may be diagnosed with prediabetes:  If your FBG is 100?125 mg/dL (5.6-6.9 mmol/L).  If your A1c level is 5.7?6.4%.  If your OGTT result is 140?199 mg/dL (7.8-11 mmol/L). These blood tests may be repeated to confirm your diagnosis. How can this condition affect me? The  pancreas produces a hormone (insulin) that helps to move glucose from the bloodstream into cells. When cells in the body do not respond properly to insulin that the body makes (insulin resistance), excess glucose builds up in the blood instead of going into cells. As a result, high blood glucose (hyperglycemia) can develop, which can cause many complications. Hyperglycemia is a symptom of prediabetes. Having high blood glucose for a long time is dangerous. Too much glucose in your blood can damage your nerves and blood vessels. Long-term damage can lead to complications from diabetes, which may include:  Heart disease.  Stroke.  Blindness.  Kidney disease.  Depression.  Poor circulation in the feet and legs, which could lead to surgical removal (amputation) in severe cases. What can increase my risk? Risk factors for prediabetes include:  Having a family member with type 2 diabetes.  Being overweight or obese.  Being older than age 17.  Being of American [place], African-American, Hispanic/Latino, or Asian/Pacific Islander descent.  Having an inactive (sedentary) lifestyle.  Having a history of heart disease.  History of gestational diabetes or polycystic ovary syndrome (PCOS), in women.  Having low levels of good cholesterol (HDL-C) or high levels of blood fats (triglycerides).  Having high blood pressure. What actions can I take to prevent diabetes?      Be physically active. ? Do moderate-intensity physical activity for 30 or more minutes on 5 or more days of the week, or as much as told by your health care provider. This could be brisk walking, biking, or water  aerobics. ? Ask your health care provider what activities are safe for you. A mix of physical activities may be best, such as walking, swimming, cycling, and strength training.  Lose weight as told by your health care provider. ? Losing 5-7% of your body weight can reverse insulin resistance. ? Your health care  provider can determine how much weight loss is best for you and can help you lose weight safely.  Follow a healthy meal plan. This includes eating lean proteins, complex carbohydrates, fresh fruits and vegetables, low-fat dairy products, and healthy fats. ? Follow instructions from your health care provider about eating or drinking restrictions. ? Make an appointment to see a diet and nutrition specialist (registered dietitian) to help you create a healthy eating plan that is right for you.  Do not smoke or use any tobacco products, such as cigarettes, chewing tobacco, and e-cigarettes. If you need help quitting, ask your health care provider.  Take over-the-counter and prescription medicines as told by your health care provider. You may be prescribed medicines that help lower the risk of type 2 diabetes.  Keep all follow-up visits as told by your health care provider. This is important. Summary  Prediabetes is the condition of having a blood sugar (blood glucose) level that is higher than it should be, but not high enough for you to be diagnosed with type 2 diabetes.  Having prediabetes puts you at risk for developing type 2 diabetes (type 2 diabetes mellitus).  To help prevent type 2 diabetes, make lifestyle changes such as being physically active and eating a healthy diet. Lose weight as told by your health care provider. This information is not intended to replace advice given to you by your health care provider. Make sure you discuss any questions you have with your health care provider. Document Revised: 11/20/2018 Document Reviewed: 09/19/2015 Elsevier Patient Education  [employer].    If you have lab work done today you will be contacted with your lab results within the next 2 weeks.  If you have not heard from [place] then please contact us. The fastest way to get your results is to register for My Chart.   IF you received an x-ray today, you will receive an invoice from  [facility] Radiology. Please contact [facility] Radiology at [phone] with questions or concerns regarding your invoice.   IF you received labwork today, you will receive an invoice from [place] [facility]. Please contact [facility] at [phone] with questions or concerns regarding your invoice.   Our billing staff will not be able to assist you with questions regarding bills from these companies.  You will be contacted with the lab results as soon as they are available. The fastest way to get your results is to activate your My Chart account. Instructions are located on the last page of this paperwork. If you have not heard from [place] regarding the results in 2 weeks, please contact this office.         Signed, Merri Ray, MD [facility]

## 2020-04-26 DIAGNOSIS — L819 Disorder of pigmentation, unspecified: Secondary | ICD-10-CM | POA: Diagnosis not present

## 2020-04-26 DIAGNOSIS — D229 Melanocytic nevi, unspecified: Secondary | ICD-10-CM | POA: Diagnosis not present

## 2020-04-26 DIAGNOSIS — L570 Actinic keratosis: Secondary | ICD-10-CM | POA: Diagnosis not present

## 2020-04-26 DIAGNOSIS — R202 Paresthesia of skin: Secondary | ICD-10-CM | POA: Diagnosis not present

## 2020-04-26 DIAGNOSIS — L814 Other melanin hyperpigmentation: Secondary | ICD-10-CM | POA: Diagnosis not present

## 2020-04-26 DIAGNOSIS — D1801 Hemangioma of skin and subcutaneous tissue: Secondary | ICD-10-CM | POA: Diagnosis not present

## 2020-04-26 DIAGNOSIS — L821 Other seborrheic keratosis: Secondary | ICD-10-CM | POA: Diagnosis not present

## 2020-04-26 DIAGNOSIS — L308 Other specified dermatitis: Secondary | ICD-10-CM | POA: Diagnosis not present

## 2020-04-27 ENCOUNTER — Encounter: Payer: Self-pay | Admitting: Internal Medicine

## 2020-04-27 ENCOUNTER — Inpatient Hospital Stay: Payer: Medicare Other | Attending: Internal Medicine | Admitting: Internal Medicine

## 2020-04-27 ENCOUNTER — Inpatient Hospital Stay: Payer: Medicare Other

## 2020-04-27 ENCOUNTER — Other Ambulatory Visit: Payer: Self-pay

## 2020-04-27 VITALS — BP 136/69 | HR 73 | Temp 98.0°F | Resp 18 | Ht 67.0 in | Wt 174.0 lb

## 2020-04-27 DIAGNOSIS — M1990 Unspecified osteoarthritis, unspecified site: Secondary | ICD-10-CM | POA: Insufficient documentation

## 2020-04-27 DIAGNOSIS — Z79899 Other long term (current) drug therapy: Secondary | ICD-10-CM | POA: Diagnosis not present

## 2020-04-27 DIAGNOSIS — Z7982 Long term (current) use of aspirin: Secondary | ICD-10-CM | POA: Insufficient documentation

## 2020-04-27 DIAGNOSIS — M459 Ankylosing spondylitis of unspecified sites in spine: Secondary | ICD-10-CM | POA: Diagnosis not present

## 2020-04-27 DIAGNOSIS — I10 Essential (primary) hypertension: Secondary | ICD-10-CM | POA: Diagnosis not present

## 2020-04-27 DIAGNOSIS — D696 Thrombocytopenia, unspecified: Secondary | ICD-10-CM

## 2020-04-27 DIAGNOSIS — I25119 Atherosclerotic heart disease of native coronary artery with unspecified angina pectoris: Secondary | ICD-10-CM

## 2020-04-27 DIAGNOSIS — E785 Hyperlipidemia, unspecified: Secondary | ICD-10-CM | POA: Diagnosis not present

## 2020-04-27 DIAGNOSIS — F419 Anxiety disorder, unspecified: Secondary | ICD-10-CM | POA: Insufficient documentation

## 2020-04-27 DIAGNOSIS — I2510 Atherosclerotic heart disease of native coronary artery without angina pectoris: Secondary | ICD-10-CM | POA: Diagnosis not present

## 2020-04-27 DIAGNOSIS — D693 Immune thrombocytopenic purpura: Secondary | ICD-10-CM | POA: Diagnosis not present

## 2020-04-27 LAB — CBC WITH DIFFERENTIAL (CANCER CENTER ONLY)
Abs Immature Granulocytes: 0.12 10*3/uL — ABNORMAL HIGH (ref 0.00–0.07)
Basophils Absolute: 0 10*3/uL (ref 0.0–0.1)
Basophils Relative: 0 %
Eosinophils Absolute: 0.2 10*3/uL (ref 0.0–0.5)
Eosinophils Relative: 3 %
HCT: 45.9 % (ref 39.0–52.0)
Hemoglobin: 14.8 g/dL (ref 13.0–17.0)
Immature Granulocytes: 2 %
Lymphocytes Relative: 15 %
Lymphs Abs: 0.9 10*3/uL (ref 0.7–4.0)
MCH: 29.2 pg (ref 26.0–34.0)
MCHC: 32.2 g/dL (ref 30.0–36.0)
MCV: 90.5 fL (ref 80.0–100.0)
Monocytes Absolute: 1.3 10*3/uL — ABNORMAL HIGH (ref 0.1–1.0)
Monocytes Relative: 21 %
Neutro Abs: 3.6 10*3/uL (ref 1.7–7.7)
Neutrophils Relative %: 59 %
Platelet Count: 281 10*3/uL (ref 150–400)
RBC: 5.07 MIL/uL (ref 4.22–5.81)
RDW: 16.2 % — ABNORMAL HIGH (ref 11.5–15.5)
WBC Count: 6.1 10*3/uL (ref 4.0–10.5)
nRBC: 0 % (ref 0.0–0.2)

## 2020-04-27 NOTE — Progress Notes (Signed)
[place] [phone]   [phone]  OFFICE PROGRESS NOTE  [name], MD [place] [address]  DIAGNOSIS: Thrombocytopenia, most likely idiopathic thrombocytopenic purpura.  PRIOR THERAPY:None.  CURRENT THERAPY: observation.  INTERVAL HISTORY: [name] 79 y.o. male returns to the clinic today for annual follow-up visit.  The patient is feeling fine today with no concerning complaints.  He denied having any current chest pain, shortness of breath, cough or hemoptysis.  He denied having any fever or chills.  He has no nausea, vomiting, diarrhea or constipation.  He denied having any bleeding, bruises or ecchymosis.  The patient is here today for evaluation of repeat CBC.  MEDICAL HISTORY: Past Medical History:  Diagnosis Date  . Abnormal chest x-ray   . Allergic rhinitis   . Allergy   . Ankylosing spondylitis ([place])   . Anxiety   . Arthritis   . Colonic polyp   . Coronary atherosclerosis of native coronary artery   . Cough   . Dizziness 10/21/2016  . Ganglion cyst of wrist    left  . Heart attack ([place]) 2008  . Hemorrhoids   . History of malaria   . Hyperlipidemia    with low HDL  . Hypertension   . Kidney stone   . Other disorder of muscle, ligament, and fascia   . Rotator cuff tear   . Swelling of lower extremity 10/16/2015    ALLERGIES:  is allergic to celebrex [celecoxib] and tramadol.  MEDICATIONS:  Current Outpatient Medications  Medication Sig Dispense Refill  . amLODipine (NORVASC) 5 MG tablet Take 1 tablet (5 mg total) by mouth daily. 90 tablet 3  . Apoaequorin (PREVAGEN PO) Take 1 tablet by mouth daily.     [name] aspirin 81 MG EC tablet Take 81 mg by mouth daily.     . budesonide-formoterol (SYMBICORT) 80-4.5 MCG/ACT inhaler Inhale 2 puffs into the lungs as needed.    . chlorpheniramine (CHLOR-TRIMETON) 4 MG tablet Take 4 mg by mouth 2 (two) times daily as needed for allergies. (Patient not  taking: Reported on 02/25/2020)    . famotidine (PEPCID) 20 MG tablet Take 20 mg by mouth at bedtime.    . Multiple Vitamins-Minerals (ICAPS AREDS 2) CAPS Take by mouth daily.    . pantoprazole (PROTONIX) 40 MG tablet TAKE 1 TABLET BY MOUTH ONCE DAILY 30-60 MINUTES BEFORE FIRST MEAL OF THE DAY 90 tablet 1  . Respiratory Therapy Supplies (FLUTTER) DEVI by Does not apply route as directed. (Patient not taking: Reported on 02/25/2020)    . simvastatin (ZOCOR) 20 MG tablet TAKE 1 TABLET EVERY DAY WITH BREAKFAST. Please schedule annual appt with Dr. Burt Knack for refills. [phone]. 2nd attempt. 90 tablet 2  . UNABLE TO FIND Med Name: CPAP per Dr Halford Chessman     No current facility-administered medications for this visit.    SURGICAL HISTORY:  Past Surgical History:  Procedure Laterality Date  . bicep rupture    . CORONARY ANGIOPLASTY WITH STENT PLACEMENT    . HEMORRHOID SURGERY  10/10   in [place]  . LEFT HEART CATH AND CORONARY ANGIOGRAPHY N/A 01/14/2017   Procedure: Left Heart Cath and Coronary Angiography;  Surgeon: [name], MD;  Location: [facility];  Service: Cardiovascular;  Laterality: N/A;  . LEFT HEART CATHETERIZATION WITH CORONARY ANGIOGRAM N/A 09/03/2012   Procedure: LEFT HEART CATHETERIZATION WITH CORONARY ANGIOGRAM;  Surgeon: [name], MD;  Location[facility] C[facility] LAB;  Service: Cardiovascular;  Laterality: N/A;  . ROTATOR CUFF REPAIR     bilat by Dr. French Ana    REVIEW OF SYSTEMS:  A comprehensive review of systems was negative.   PHYSICAL EXAMINATION: General appearance: alert, cooperative and no distress Head: Normocephalic, without obvious abnormality, atraumatic Neck: no adenopathy Lymph nodes: Cervical, supraclavicular, and axillary nodes normal. Resp: clear to auscultation bilaterally Back: symmetric, no curvature. ROM normal. No CVA tenderness. Cardio: regular rate and rhythm, S1, S2 normal, no murmur, click, rub or gallop GI: soft, non-tender; bowel sounds normal;  no masses,  no organomegaly Extremities: extremities normal, atraumatic, no cyanosis or edema  ECOG PERFORMANCE STATUS: 0 - Asymptomatic  Blood pressure 136/69, pulse 73, temperature 98 F (36.7 C), temperature source Tympanic, resp. rate 18, height 5\' 7"  (1.702 m), weight 174 lb (78.9 kg), SpO2 94 %.  LABORATORY DATA: Lab Results  Component Value Date   WBC 6.1 04/27/2020   HGB 14.8 04/27/2020   HCT 45.9 04/27/2020   MCV 90.5 04/27/2020   PLT 281 04/27/2020      Chemistry      Component Value Date/Time   NA 141 01/03/2020 0832   NA 141 04/21/2017 0823   K 4.6 01/03/2020 0832   K 4.0 04/21/2017 0823   CL 101 01/03/2020 0832   CL 106 09/30/2012 0949   CO2 23 01/03/2020 0832   CO2 25 04/21/2017 0823   BUN 20 01/03/2020 0832   BUN 20.2 04/21/2017 0823   CREATININE 0.89 01/03/2020 0832   CREATININE 0.9 04/21/2017 0823      Component Value Date/Time   CALCIUM 10.1 01/03/2020 0832   CALCIUM 9.6 04/21/2017 0823   ALKPHOS 67 01/03/2020 0832   ALKPHOS 61 04/21/2017 0823   AST 24 01/03/2020 0832   AST 22 04/21/2017 0823   ALT 25 01/03/2020 0832   ALT 36 04/21/2017 0823   BILITOT 0.6 01/03/2020 0832   BILITOT 0.99 04/21/2017 0823     BONE MARROW REPORT FINAL DIAGNOSIS RADIOGRAPHIC STUDIES: No results found.  ASSESSMENT AND PLAN:  This is a very pleasant 79 years old white male with idiopathic thrombocytopenic purpura who is currently on observation.  The patient is feeling fine today with no concerning complaints. Repeat CBC showed normal platelets count. I recommended for him to continue on observation with repeat CBC in 1 year. He was advised to call immediately if he has any concerning symptoms in the interval. He was advised to call immediately if he has any bleeding issues or other concerning complaints in the interval. All questions were answered. The patient knows to call the clinic with any problems, questions or concerns. We can certainly see the patient much  sooner if necessary.  Disclaimer: This note was dictated with voice recognition software. Similar sounding words can inadvertently be transcribed and may not be corrected upon review.

## 2020-04-28 ENCOUNTER — Telehealth: Payer: Self-pay | Admitting: Internal Medicine

## 2020-04-28 NOTE — Telephone Encounter (Signed)
Scheduled per los. Called, not able to leave msg. Mailed printout  

## 2020-05-02 DIAGNOSIS — K644 Residual hemorrhoidal skin tags: Secondary | ICD-10-CM | POA: Diagnosis not present

## 2020-06-13 DIAGNOSIS — H04123 Dry eye syndrome of bilateral lacrimal glands: Secondary | ICD-10-CM | POA: Diagnosis not present

## 2020-06-13 DIAGNOSIS — H2513 Age-related nuclear cataract, bilateral: Secondary | ICD-10-CM | POA: Diagnosis not present

## 2020-06-13 DIAGNOSIS — H43813 Vitreous degeneration, bilateral: Secondary | ICD-10-CM | POA: Diagnosis not present

## 2020-06-13 DIAGNOSIS — H353131 Nonexudative age-related macular degeneration, bilateral, early dry stage: Secondary | ICD-10-CM | POA: Diagnosis not present

## 2020-06-23 DIAGNOSIS — Z23 Encounter for immunization: Secondary | ICD-10-CM | POA: Diagnosis not present

## 2020-06-27 DIAGNOSIS — B078 Other viral warts: Secondary | ICD-10-CM | POA: Diagnosis not present

## 2020-07-13 DIAGNOSIS — Z23 Encounter for immunization: Secondary | ICD-10-CM | POA: Diagnosis not present

## 2020-07-28 ENCOUNTER — Ambulatory Visit: Payer: Medicare Other | Admitting: Internal Medicine

## 2020-07-31 ENCOUNTER — Ambulatory Visit: Payer: Medicare Other | Admitting: Internal Medicine

## 2020-08-14 ENCOUNTER — Ambulatory Visit (INDEPENDENT_AMBULATORY_CARE_PROVIDER_SITE_OTHER): Payer: Medicare Other | Admitting: Internal Medicine

## 2020-08-14 ENCOUNTER — Encounter: Payer: Self-pay | Admitting: Internal Medicine

## 2020-08-14 ENCOUNTER — Other Ambulatory Visit: Payer: Self-pay

## 2020-08-14 DIAGNOSIS — J45991 Cough variant asthma: Secondary | ICD-10-CM

## 2020-08-14 DIAGNOSIS — J849 Interstitial pulmonary disease, unspecified: Secondary | ICD-10-CM | POA: Diagnosis not present

## 2020-08-14 MED ORDER — BUDESONIDE-FORMOTEROL FUMARATE 80-4.5 MCG/ACT IN AERO
INHALATION_SPRAY | RESPIRATORY_TRACT | 12 refills | Status: DC
Start: 2020-08-14 — End: 2021-03-12

## 2020-08-14 MED ORDER — AZELASTINE-FLUTICASONE 137-50 MCG/ACT NA SUSP: 1.0000 | Freq: Two times a day (BID) | NASAL | 11 refills | Status: DC

## 2020-08-14 MED ORDER — CHLORPHENIRAMINE MALEATE 4 MG PO TABS: ORAL_TABLET | ORAL | Status: DC

## 2020-08-14 NOTE — Assessment & Plan Note (Signed)
HRCT chest 4/17/ 2018 >  suggestive of interstitial lung disease, with a pattern that is most compatible with nonspecific interstitial pneumonia (NSIP).  - PFT's  10/17/2016    FVC 2.73 (73%) with DLCO  16.71(60%) and corrects to 3.95(90%) for alv vol PFT's  07/29/2019  FVC 2.57 (80 % ) no obst with DLCO  15.24 (68%) corrects to 3.94 (98%)  for alv volume and FV curve somwhat flattened insp portion/ nl exp - 07/29/2019 referred back to Dr Corliss Skains   No evidence for progressive ILD, encouraged to continue regular walking and monitor sats at peak activity levels as well as activity performance over the next 6 months - call if losing ground         Each maintenance medication was reviewed in detail including emphasizing most importantly the difference between maintenance and prns and under what circumstances the prns are to be triggered using an action plan format where appropriate.  Total time for H and P, chart review, counseling, teaching devices (nasal inhaler and hfa) and generating customized AVS unique to this office visit / charting = 37 min

## 2020-08-14 NOTE — Progress Notes (Signed)
Brief patient profile:  69  yowm never smoker with ankylosing spondilitis  with new onset variable  cough since Oct 2016 comes and goes s background of asthma but with background of nasal allergies for which took shots and moved residence in 1980s  >  No further problems except nasal drip /[place] then fine [place] going there.    History of Present Illness  Congestion in chest x Oct 2016 worse in evenings with mostly dry coughing and much worse x sev weeks > [place] eval on 07/15/16 cough for a week/ albterol and pred no better  rec Advair one puff twice per day until sample completed >> call if you feel you need a refill after this Zithromax 250 mg pill >> 2 pills on day 1, then 1 pill daily for next four days Albuterol 2 puffs every 4 to 6 hours as needed for cough, wheeze, or chest congestion Finish course of prednisone    07/22/2016 acute extended ov/Rhenda [place] re:  Refractory cough > sob on advair and pred taper and getting worse Chief Complaint  Patient presents with  . Acute Visit    Increased SOB and cough are not improving since acute ov here 07/15/16. His cough has been occ prod with light colored sputum.  He states that his breathing does okay in the am's and then gets worse in the evenings.   on advair dpi/ benzoate and took pred 10 mg one tablet  This am and cough seems ok when he wakes up and progressively worse with Minimal actual sputum production as the day goes on and then does not really bother him while sleeping.. Shaking on details of care/ names of meds/ no better with saba  rec Take delsym two tsp every 12 hours and supplement if needed with  tramadol 50 mg up to 1 every 4 hours  .   Symbicort 80 Take 2 puffs first thing in am and then another 2 puffs about 12 hours later - if helping refill it Only use your albuterol (ventolin) inhaler  As long as coughing at all:  Try prilosec otc 20mg   Take 30-60 min before first meal of the day  and Pepcid ac (famotidine) 20 mg one @  bedtime until cough is completely gone for at least a week without the need for cough suppression GERD diet    07/30/2016 acute extended ov/Jaymes Revels re: refractory cough since late Nov 2017  Chief Complaint  Patient presents with  . Acute Visit    Pt c/o increased cough and congestion for the past 2 wks. He states symptoms get worse in the evening. Cough is prod in the am's with thick, clear sputum.  He states he can not lie down flat due to cough and cough sometimes waked him up. He also c/o wheezing.   brought all his meds, not using symb 2bid per count, only missing 11 tramadol "it's for pain, right? "  Some better while on pred, worse since finished it/ only taking prilosec otc 20 mg qam / is taking pepcid 20 mg hs  Not using any saba "it's for emergencies, right?"  rec Prednisone 10 mg take  4 each am x 2 days,   2 each am x 2 days,  1 each am x 2 days and stop  Increase the prilosec ac to where you take x 2 30-60 mn before breakfast  And continue the pepcid ac 20mg  in evening  Continue symbicort 80 (same  as dulera 100 sample) Take 2 puffs first thing in am and then another 2 puffs about 12 hours later.  Only use your albuterol as a rescue medication  For cough use mucinex dm 1200 mg every 12 hours and supplement with the tramadol up to 2 every 4 hours until no cough x 3 days  Please see patient coordinator before you leave today  to schedule sinus CT    08/21/16  Restart Symbicort 2 puffs Twice daily  .  Restart Prilosec 20mg  daily before meal  Restart Pepcid 20mg  At bedtime   May use Zyrtec 10mg  At bedtime  As needed  Drainage  Zpack take as directed.  Mucinex DM Twice daily  As needed  Cough/congestion .     10/10/2016 acute extended ov/Eliannah Hinde re:  uacs vs asthma  maint on symb 80 2bid / gerd rx with otcs only  Chief Complaint  Patient presents with  . Acute Visit    Pt c/o cough and increased SOB x 1 wk, and progressively worse over the past  3-4 days. His cough is prod with thick, clear sputum. He states can not lie down flat without cough.   sensation of something stuck never gone away since = oct 2016 then acutely worse sob/ cough x 1 week prior to OV  And not better p saba Does not have mucinex dm / no zyrtec  rec First take delsym two tsp every 12 hours and supplement if needed with  tramadol 50 mg up to 2 every 4 hours to suppress the urge to cough at all or even clear your throat. Swallowing water or using ice chips/non mint and menthol containing candies (such as lifesavers or sugarless jolly ranchers) are also effective.  You should rest your voice and avoid activities that you know make you cough. Once you have eliminated the cough for 3 straight days try reducing the tramadol first,  then the delsym as tolerated.   Prednisone 10 mg take  4 each am x 2 days,   2 each am x 2 days,  1 each am x 2 days and stop (this is to eliminate allergies and inflammation from coughing) Protonix (pantoprazole) Take 30-60 min before first meal of the day and Pepcid 20 mg one bedtime plus chlorpheniramine 4 mg x 2 at bedtime (both available over the counter)  until cough is completely gone for at least a week without the need for cough suppression For drainage / throat tickle try take CHLORPHENIRAMINE  4 mg - take one every 4 hours as needed - available over the counter- may cause drowsiness so start with just a bedtime dose or two and see how you tolerate it before trying in daytime   GERD diet  Stop prilosec/ mucinex  Please remember to go to the lab  department downstairs in the basement  for your tests - we will call you with the results when they are available.  Please schedule a follow up office visit in 2 weeks, sooner if needed  with all medications /inhalers/ solutions in hand so we can verify exactly what you are taking. This includes all medications from all doctors and over the counters Add did not got to lab as rec    10/17/2016  f/u  ov/Nechelle Petrizzo re: intermittent cough x oct 2016 def gets better on prednisone / sense of chest congestion but no excess mucus Chief Complaint  Patient presents with  . Follow-up    PFT's done today.  symptoms of daily  cough  worse p supper Did not bring meds as requested / not clear he even read them when reading them back line by line  Not using any albuterol "doesn't help"  Not able to lie down due to cough x month x no better even while on prednisone though "80% better" on pred vs off it/ no longer using cpap rec Add singulair each evening 10 daily trial basis  Please remember to go to the lab  department downstairs in the basement  for your tests - we will call you with the results when they are available. See Tammy NP in 4  weeks with all your medications, same page with your medication use she will arrange follow up with me or with allergy depending on your tests  Add : consider adding flutter if doesn't have one and HRCT looking for bronchiectasis > POS    05/19/2018  f/u ov/Inita Uram re: bronchiectasis/ chronic cough/ never took demerol, only took a few prednisone > 95% resolution at med cal ov 03/03/18 so d/c'd singulair and not taking symb 80 as maint (brought med calendar/ not following as written) Chief Complaint  Patient presents with  . Follow-up    increased chest congestion and cough x 3-4 wks. He is coughing up some cream colored sputum.   Dyspnea:  Still able to jog / steps ok   Cough: was better for months but never completely gone then worse x 4 weeks esp  In am Sleeping: 2- 3 pillows flat bed - no bed blocks SABA use: not using at  02: none  rec rec Bronchiectasis =   you have scarring of your bronchial tubes  Whenever you develop cough congestion take  mucinex dm up to 1200 mg every 12 hours  >  Symbicort is supposed to be 1-2 puffs 1st thing in am and repeat  every 12 hours if you think you need it or not If cough getting worse >  Prednisone 10 mg take  4 each am x 2 days,    2 each am x 2 days,  1 each am x 2 days and stop  Consider placing 6 in bed blocks under the head of your bed (available at most mattress stores)       06/19/2018  f/u ov/Germaine Shenker re: bronchiectasis/ did not follow any of the action plans on med calendar he brought with him , still on symb 80 just 1 bid despite flare of cough/ not using mucienx dm or flutter approp Chief Complaint  Patient presents with  . Follow-up    non prod cough x 1 wk.  He has not had to use his prn pred or albuterol.   Dyspnea:  Still able to jog and go up to steps Cough: non prod cough  Worse when wake up but non productive  Sleeping: 3 pillows no bed blocks SABA use: no  rec Symbicort 80 should be 2  Every 12 hours unless you are doing great See calendar for specific medication instructions      09/21/2018  f/u ov/Adelae Yodice re: Bronchiectasis  Chief Complaint  Patient presents with  . Follow-up    Breathing is overall doing well. He has not had to use the prn pred taper or albuterol.   Dyspnea:  Walks a mile with the dog / up some up some hills  Cough: gone now  Sleeping: to pee/ special pillow, no bed blocks  SABA use: none  rec Again I recommend the 6-8 inch bed under the  head of the bed We changed the symbicort 80 to where you only take it up to 2 puffs every 12 hours for cough/ congestion as needed as your "plan A"    07/29/2019  f/u ov/Remmington Urieta re: Bronchiectasis/ Ankylosing spondylitis - using rare symb 80 2 bid prn (still on samples) Chief Complaint  Patient presents with  . Follow-up    PFT done. Breathing is unchanged since the last visit. No new co's.   Dyspnea:  Physically active but  no longer regular walks - more limited by back pain  Cough: resolved Sleeping: bed blocks/cpap / no resp problems  SABA use: none  02: no  rec Stay as physically active as your back will let you  We will be referring you back to Dr Estanislado Pandy Please remember to go to the  x-ray department >   Chronic elevation of RIGHT  diaphragm with chronic bronchitic changes and RIGHT basilar atelectasis   08/14/2020  f/u ov/Rondalyn Belford re: bronchiectasis/ Ankylosing spondylitis - off symbicort x 6 months  Chief Complaint  Patient presents with  . Follow-up    Seems to be ok other than a little nasal drainage  Dyspnea:  Not limited by breathing / walking dog some hills but tired/breathing more difficult ? Since off symbicort Cough: assoc with pnds/ watery ant nasal d/c  Sleeping: does fine p chlortab hs but never took more than one in 24 h (no the instruction on med calendar) SABA use: as needed  02: not using    No obvious day to day or daytime variability or assoc excess/ purulent sputum or mucus plugs or hemoptysis or cp or chest tightness, subjective wheeze or overt r hb symptoms.   Sleeping flat  without nocturnal  or early am exacerbation  of respiratory  c/o's or need for noct saba. Also denies any obvious fluctuation of symptoms with weather or environmental changes or other aggravating or alleviating factors except as outlined above   No unusual exposure hx or h/o childhood pna/ asthma or knowledge of premature birth.  Current Allergies, Complete Past Medical History, Past Surgical History, Family History, and Social History were reviewed in [employer] record.  ROS  The following are not active complaints unless bolded Hoarseness, sore throat, dysphagia, dental problems, itching, sneezing,  nasal congestion or discharge of excess mucus or purulent secretions, ear ache,   fever, chills, sweats, unintended wt loss or wt gain, classically pleuritic or exertional cp,  orthopnea pnd or arm/hand swelling  or leg swelling, presyncope, palpitations, abdominal pain, anorexia, nausea, vomiting, diarrhea  or change in bowel habits or change in bladder habits, change in stools or change in urine, dysuria, hematuria,  rash, arthralgias, visual complaints, headache, numbness, weakness or ataxia or problems with  walking or coordination,  change in mood or  memory.        Current Meds  Medication Sig  . amLODipine (NORVASC) 5 MG tablet Take 1 tablet (5 mg total) by mouth daily.  [name] Apoaequorin (PREVAGEN PO) Take 1 tablet by mouth daily.   [name] aspirin 81 MG EC tablet Take 81 mg by mouth daily.  . famotidine (PEPCID) 20 MG tablet Take 20 mg by mouth at bedtime.  . Multiple Vitamins-Minerals (ICAPS AREDS 2) CAPS Take by mouth daily.  . pantoprazole (PROTONIX) 40 MG tablet TAKE 1 TABLET BY MOUTH ONCE DAILY 30-60 MINUTES BEFORE FIRST MEAL OF THE DAY  . simvastatin (ZOCOR) 20 MG tablet TAKE 1 TABLET EVERY DAY WITH BREAKFAST. Please schedule  annual appt with Dr. Burt Knack for refills. [phone]. 2nd attempt.              Pulmonary tests CT angio chest 11/30/15 >> motion artifact, mosaic pattern, calcified granuloma SNIFF test 11/30/15 >> elevated Rt diaphragm, but has b/l diaphragm movement  Cardiac tests 10/16/15 Doppler legs b/l >> no DVT 11/30/15 Echo >> EF 65 to [id number], grade 1 diastolic CHF  Sleep tests HST 12/21/15 >> AHI 44.5, SpO2 low 64% Auto CPAP 03/02/16 to 03/31/16 >> used on 26 of 30 nights with average 6 hrs 32 min.  Average AHI 5.5 with median CPAP 8 and 95 th percentile CPAP 11 cm H2O ONO with CPAP 04/10/16 >> test time 6 hrs 30 min.  Mean SpO2 93.51%, low SpO2 77%.  Spent 7 min with SpO2 < 88%   Past medical history Ankylosing spondylitis, Allergies, CAD, HLD, HTN, Nephrolithiasis, Malaria      Physical Exam  08/14/2020        176 07/29/2019    175 09/21/2018      182  02/18/2018       186  02/13/2018         190  11/17/2017         186  05/20/2017       190  01/15/2017         183  12/12/2016         186  10/17/2016         183  10/10/2016         187  07/30/2016     191  07/22/16 194 lb (88 kg)  07/15/16 196 lb (88.9 kg)  07/12/16 192 lb 9.6 oz (87.4 kg)     Vital signs reviewed  08/14/2020  - Note at rest 02 sats  96% on RA   General appearance:    Somber wm easily confused with details  of care   HEENT : pt wearing mask not removed for exam due to covid -19 concerns.    NECK :  without JVD/Nodes/TM/ nl carotid upstrokes bilaterally   LUNGS: no acc muscle use,  Nl contour chest  With minimal insp crackles bases  bilaterally without cough on insp or exp maneuvers   CV:  RRR  no s3 or murmur or increase in P2, and no edema   ABD:  soft and nontender with nl inspiratory excursion in the supine position. No bruits or organomegaly appreciated, bowel sounds nl  MS:  Nl gait/ ext warm without deformities, calf tenderness, cyanosis or clubbing No obvious joint restrictions   SKIN: warm and dry without lesions    NEURO:  alert, approp, nl sensorium with  no motor or cerebellar deficits apparent.

## 2020-08-14 NOTE — Assessment & Plan Note (Signed)
Onset Oct 2016  07/22/2016    > try reduce symbicort to 80 2bid  - FENO 07/15/2016  =   42 on sym 160 2bid  - 07/30/2016  After extensive coaching HFA effectiveness =    90% > continue symb 80 2bid  - Sinus CT 07/31/2016 >>> neg - FENO 10/10/2016  =   12 during flare while on symb 80 2bid - PFT's  10/17/2016  FEV1 2.40 (89 % ) ratio 87  p 0 % improvement from saba p symb 80 x 2 prior to study with DLCO  60/58 % corrects to 90 % for alv volume and erv =50%  - Allergy profile 10/17/2016 >  Eos 0.0 /  IgE 55 Rast pos dust only   - 10/17/2016  Added singulair 10 mg  - HRCT chest 11/2016  -NSIP Changes/Bronchiectasis noted  - 12/12/2016 spirometry s obst with active symptoms >> trial off symbicort as not convinced helping - 01/13/17 trial of dymista > improved 05/19/2017 but did not continue it due to cost  - 11/10/17 d/c'd ppi and did not refill > 11/17/17 rec ok to leave off but restart prilosec for cough  - 02/07/18 recurrent severe cough on last day of [place] cruise > did not follow action plan  - FENO 02/18/2018  =   11 during flare p am prednisone - Spirometry 02/18/2018  No obst on no rx  Prior with active wheeze on exam - 03/03/18 d/c'd singulair  - 05/19/2018  After extensive coaching inhaler device,  effectiveness =    90% restart symb 80 1-2 q 12h prn   - 08/14/2020  Reported worse off symbicort / dymista > rechallnge with both x one month and use 1st gen H1 blockers per guidelines  As needed to supplement and if no better then stop both maint rx and just use more h1 prn , if better stop one maint rx  at a time   >>> really not clear how much uacs vs cough variant asthma present so try strategy above     Each maintenance medication was reviewed in detail including most importantly the difference between maintenance and as needed and under what circumstances the prns are to be used. This was done in the context of a medication calendar review which provided the patient with a user-friendly unambiguous mechanism  for medication administration and reconciliation and provides an action plan for all active problems. It is critical that this be shown to every doctor  for modification during the office visit if necessary so the patient can use it as a working document.

## 2020-08-14 NOTE — Patient Instructions (Addendum)
Maintenance/automatically Restart symbicort 80 Take 2 puffs first thing in am and then another 2 puffs about 12 hours later - blow out thru nose   Restart dysmista one twice daily     As needed  For drainage / throat tickle try take CHLORPHENIRAMINE  4 mg  (Chlortab 4mg   at should be easiest to find in the green box)  take one every 4 hours as needed - available over the counter- may cause drowsiness so start with just a bedtime dose or two and see how you tolerate it before trying in daytime      Please schedule a follow up visit in 6 months but call sooner if needed

## 2020-08-17 ENCOUNTER — Other Ambulatory Visit: Payer: Self-pay | Admitting: Internal Medicine

## 2020-09-04 DIAGNOSIS — H9313 Tinnitus, bilateral: Secondary | ICD-10-CM | POA: Diagnosis not present

## 2020-09-04 DIAGNOSIS — H838X3 Other specified diseases of inner ear, bilateral: Secondary | ICD-10-CM | POA: Diagnosis not present

## 2020-09-04 DIAGNOSIS — H903 Sensorineural hearing loss, bilateral: Secondary | ICD-10-CM | POA: Diagnosis not present

## 2020-09-08 NOTE — Progress Notes (Signed)
Office Visit Note  Patient: [name]             Date of Birth: 1941-04-15           MRN: [id number]             PCP: [name], MD Referring: [name], MD Visit Date: 09/22/2020 Occupation: @GUAROCC @  Subjective:  Other (Recent fall on black ice, right elbow pain )   History of Present Illness: [name] is a 80 y.o. male with history of osteoarthritis and degenerative disc disease.  He returns today after 1 year.  He states he continues to have some upper and lower back pain.  He also has discomfort in his knee joints especially when he gets up from a chair.  He has no difficulty climbing the stairs.  He denies any history of joint swelling.  Activities of Daily Living:  Patient reports morning stiffness for 5  minutes.   Patient Denies nocturnal pain.  Difficulty dressing/grooming: Reports Difficulty climbing stairs: Denies Difficulty getting out of chair: Reports Difficulty using hands for taps, buttons, cutlery, and/or writing: Denies  Review of Systems  Constitutional: Negative for fatigue.  HENT: Positive for mouth dryness and nose dryness. Negative for mouth sores.   Eyes: Negative for pain, itching and dryness.  Respiratory: Negative for shortness of breath and difficulty breathing.   Cardiovascular: Negative for chest pain and palpitations.  Gastrointestinal: Negative for blood in stool, constipation and diarrhea.  Endocrine: Positive for increased urination.  Genitourinary: Positive for difficulty urinating.  Musculoskeletal: Positive for arthralgias, joint pain, myalgias, morning stiffness and myalgias. Negative for joint swelling and muscle tenderness.  Skin: Positive for color change. Negative for rash and redness.  Allergic/Immunologic: Negative for susceptible to infections.  Neurological: Negative for dizziness, numbness, headaches, memory loss and weakness.  Hematological: Negative for bruising/bleeding tendency.   Psychiatric/Behavioral: Negative for confusion.    PMFS History:  Patient Active Problem List   Diagnosis Date Noted  . GERD (gastroesophageal reflux disease) 12/15/2017  . Unstable angina ([place]) 12/05/2017  . Angina at rest [facility]) 12/05/2017  . Atypical chest pain   . Dyslipidemia   . COPD not affecting current episode of care [facility])   . Essential hypertension 05/29/2017  . Abnormal nuclear cardiac imaging test 01/14/2017  . Bronchiectasis without complication ([place]) 15/40/0867  . ILD (interstitial lung disease) ([place]) 11/27/2016  . Dizziness 10/21/2016  . Cough variant asthma  vs UACS 07/22/2016  . Obstructive sleep apnea 12/01/2015  . Acute bronchitis 11/30/2015  . Swelling of lower extremity 10/16/2015  . Benign mole 02/04/2014  . Impaired glucose tolerance 10/26/2013  . NAFLD (nonalcoholic fatty liver disease) 04/27/2013  . Thrombocytopenia ([place]) 10/21/2012  . Exertional angina (HCC) 09/03/2012  . MALARIA 12/05/2009  . COLONIC POLYPS 12/05/2009  . HYPERCHOLESTEROLEMIA 12/05/2009  . ANXIETY 12/05/2009  . HEMORRHOIDS 12/05/2009  . Allergic rhinitis 12/05/2009  . GANGLION CYST, WRIST, LEFT 12/05/2009  . ROTATOR CUFF TEAR 12/05/2009  . OTHER DISORDER OF MUSCLE LIGAMENT AND FASCIA 12/05/2009  . ABNORMAL CHEST XRAY 12/05/2009  . CAD (coronary artery disease) 04/05/2009    Past Medical History:  Diagnosis Date  . Abnormal chest x-ray   . Allergic rhinitis   . Allergy   . Ankylosing spondylitis ([place])   . Anxiety   . Arthritis   . Colonic polyp   . Coronary atherosclerosis of native coronary artery   . Cough   . Dizziness 10/21/2016  . Ganglion cyst of  wrist    left  . Heart attack ([place]) 2008  . Hemorrhoids   . History of malaria   . Hyperlipidemia    with low HDL  . Hypertension   . Kidney stone   . Other disorder of muscle, ligament, and fascia   . Rotator cuff tear   . Swelling of lower extremity 10/16/2015    Family History  Problem Relation Age of Onset  .  Cancer Mother        melanoma  . Emphysema Mother   . Rheum arthritis Mother   . Diabetes Mother   . Alcohol abuse Father    Past Surgical History:  Procedure Laterality Date  . bicep rupture    . CORONARY ANGIOPLASTY WITH STENT PLACEMENT    . HEMORRHOID SURGERY  10/10   in [place]  . LEFT HEART CATH AND CORONARY ANGIOGRAPHY N/A 01/14/2017   Procedure: Left Heart Cath and Coronary Angiography;  Surgeon: [name], MD;  Location: [place] CV LAB;  Service: Cardiovascular;  Laterality: N/A;  . LEFT HEART CATHETERIZATION WITH CORONARY ANGIOGRAM N/A 09/03/2012   Procedure: LEFT HEART CATHETERIZATION WITH CORONARY ANGIOGRAM;  Surgeon: [name], MD;  Location: [facility] CATH LAB;  Service: Cardiovascular;  Laterality: N/A;  . ROTATOR CUFF REPAIR     bilat by Dr. French Ana   Social History   Social History Narrative  . Not on file   Immunization History  Administered Date(s) Administered  . Fluad Quad(high Dose 65+) 05/01/2019  . Influenza Split 06/01/2012  . Influenza Whole 05/30/2009, 04/20/2010  . Influenza, High Dose Seasonal PF 05/19/2017, 05/19/2018  . Influenza-Unspecified 07/12/2013, 05/10/2016  . PFIZER(Purple Top)SARS-COV-2 Vaccination 08/25/2019, 09/15/2019  . Pneumococcal Conjugate-13 05/19/2018  . Pneumococcal Polysaccharide-23 08/22/2009  . Tdap 12/04/2010, 06/04/2011, 07/31/2018  . Zoster 05/17/2008     Objective: Vital Signs: BP 122/70 (BP Location: Left Arm, Patient Position: Sitting, Cuff Size: Normal)   Pulse 67   Resp 15   Ht 5\' 7"  (1.702 m)   Wt 179 lb (81.2 kg)   BMI 28.04 kg/m    Physical Exam Vitals and nursing note reviewed.  Constitutional:      Appearance: He is well-developed and well-nourished.  HENT:     Head: Normocephalic and atraumatic.  Eyes:     Extraocular Movements: EOM normal.     Conjunctiva/sclera: Conjunctivae normal.     Pupils: Pupils are equal, round, and reactive to light.  Cardiovascular:     Rate and Rhythm: Normal rate  and regular rhythm.     Heart sounds: Normal heart sounds.  Pulmonary:     Effort: Pulmonary effort is normal.     Breath sounds: Normal breath sounds.  Abdominal:     General: Bowel sounds are normal.     Palpations: Abdomen is soft.  Musculoskeletal:     Cervical back: Normal range of motion and neck supple.  Skin:    General: Skin is warm and dry.     Capillary Refill: Capillary refill takes less than 2 seconds.  Neurological:     Mental Status: He is alert and oriented to person, place, and time.  Psychiatric:        Mood and Affect: Mood and affect normal.        Behavior: Behavior normal.      Musculoskeletal Exam: C-spine was in good range of motion.  He had no point tenderness over thoracic or lumbar spine.  He had no SI joint tenderness.  He had good range of  motion of his shoulders and elbow joints.  He has bilateral PIP and DIP thickening consistent with osteoarthritis with no synovitis.  Hip joints and knee joints with good range of motion.  He had crepitus in his knee joints.  There was no tenderness over ankles or MTPs.  CDAI Exam: CDAI Score: - Patient Global: -; Provider Global: - Swollen: -; Tender: - Joint Exam 09/22/2020   No joint exam has been documented for this visit   There is currently no information documented on the homunculus. Go to the Rheumatology activity and complete the homunculus joint exam.  Investigation: No additional findings.  Imaging: No results found.  Recent Labs: Lab Results  Component Value Date   WBC 6.1 04/27/2020   HGB 14.8 04/27/2020   PLT 281 04/27/2020   NA 141 01/03/2020   K 4.6 01/03/2020   CL 101 01/03/2020   CO2 23 01/03/2020   GLUCOSE 133 (H) 01/03/2020   BUN 20 01/03/2020   CREATININE 0.89 01/03/2020   BILITOT 0.6 01/03/2020   ALKPHOS 67 01/03/2020   AST 24 01/03/2020   ALT 25 01/03/2020   PROT 7.1 01/03/2020   ALBUMIN 4.7 01/03/2020   CALCIUM 10.1 01/03/2020   GFRAA 96 01/03/2020    Speciality  Comments: No specialty comments available.  Procedures:  No procedures performed Allergies: Celebrex [celecoxib] and Tramadol   Assessment / Plan:     Visit Diagnoses: Chronic midline low back pain without sciatica -continues to have some upper and lower back pain.  The pain is mostly muscular.  Hip no point tenderness.  X-ray findings are consistent with degenerative disc disease.  He has no radiculopathy.- Plan: Ambulatory referral to Physical Therapy  Pain in thoracic spine - X-ray findings are consistent with degenerative disease of the thoracic spine - Plan: Ambulatory referral to Physical Therapy  Chronic SI joint pain  Chronic pain of both knees - X-rays were consistent with mild osteoarthritis and chondromalacia patella.  Weight loss diet and exercise was discussed.- Plan: Ambulatory referral to Physical Therapy  Chronic pain of both ankles - The x-rays were unremarkable.  Primary osteoarthritis of both hands - clinical findings are consistent with osteoarthritis.  Joint protection muscle strengthening was discussed.  Ganglion cyst of wrist, left  Right lateral epicondylitis  ILD (interstitial lung disease) ([place]) - NSIP diagnosed by Dr. Melvyn Novas.  Bronchiectasis without complication (HCC)  Thrombocytopenia ([place]) - followed by Dr. Earlie Server  Dyslipidemia  Gastroesophageal reflux disease without esophagitis  Obstructive sleep apnea  NAFLD (nonalcoholic fatty liver disease)  Coronary artery disease involving native coronary artery of native heart with angina pectoris ([place])  History of anxiety  Essential hypertension  History of colonic polyps  History of malaria  Orders: Orders Placed This Encounter  Procedures  . Ambulatory referral to Physical Therapy   No orders of the defined types were placed in this encounter.   .  Follow-Up Instructions: Return in about 1 year (around 09/22/2021) for Osteoarthritis.   Bo Merino, MD  Note - This record has  been created using Editor, commissioning.  Chart creation errors have been sought, but may not always  have been located. Such creation errors do not reflect on  the standard of medical care.

## 2020-09-11 ENCOUNTER — Other Ambulatory Visit: Payer: Self-pay

## 2020-09-11 ENCOUNTER — Ambulatory Visit (INDEPENDENT_AMBULATORY_CARE_PROVIDER_SITE_OTHER): Payer: Medicare Other | Admitting: Family Medicine

## 2020-09-11 ENCOUNTER — Encounter: Payer: Self-pay | Admitting: Family Medicine

## 2020-09-11 VITALS — BP 128/67 | HR 72 | Temp 98.0°F | Ht 67.0 in | Wt 177.0 lb

## 2020-09-11 DIAGNOSIS — R7303 Prediabetes: Secondary | ICD-10-CM | POA: Diagnosis not present

## 2020-09-11 DIAGNOSIS — R351 Nocturia: Secondary | ICD-10-CM

## 2020-09-11 DIAGNOSIS — R3989 Other symptoms and signs involving the genitourinary system: Secondary | ICD-10-CM

## 2020-09-11 DIAGNOSIS — I25119 Atherosclerotic heart disease of native coronary artery with unspecified angina pectoris: Secondary | ICD-10-CM | POA: Diagnosis not present

## 2020-09-11 DIAGNOSIS — R39198 Other difficulties with micturition: Secondary | ICD-10-CM | POA: Diagnosis not present

## 2020-09-11 DIAGNOSIS — R300 Dysuria: Secondary | ICD-10-CM | POA: Diagnosis not present

## 2020-09-11 LAB — POC MICROSCOPIC URINALYSIS (UMFC): Mucus: ABSENT

## 2020-09-11 LAB — POCT URINALYSIS DIP (MANUAL ENTRY)
Bilirubin, UA: NEGATIVE
Blood, UA: NEGATIVE
Glucose, UA: NEGATIVE mg/dL
Ketones, POC UA: NEGATIVE mg/dL
Leukocytes, UA: NEGATIVE
Nitrite, UA: NEGATIVE
Protein Ur, POC: NEGATIVE mg/dL
Spec Grav, UA: 1.025 (ref 1.010–1.025)
Urobilinogen, UA: 0.2 E.U./dL
pH, UA: 6 (ref 5.0–8.0)

## 2020-09-11 MED ORDER — DOXAZOSIN MESYLATE 1 MG PO TABS: 1.0000 mg | ORAL_TABLET | Freq: Every day | ORAL | 1 refills | Status: DC

## 2020-09-11 NOTE — Patient Instructions (Addendum)
  I will refer you to urology and check prostate test today.  Higher dose of chlorpheniramine may affect your ability to urinate.  Could try to decrease dose slightly to see if that helps.  If any new or acute worsening symptoms please be seen right away.  Return to the clinic or go to the nearest emergency room if any of your symptoms worsen or new symptoms occur.    If you have lab work done today you will be contacted with your lab results within the next 2 weeks.  If you have not heard from [place] then please contact us. The fastest way to get your results is to register for My Chart.   IF you received an x-ray today, you will receive an invoice from [facility]. Please contact [facility] at [phone] with questions or concerns regarding your invoice.   IF you received labwork today, you will receive an invoice from [place]. Please contact [facility] at [phone] with questions or concerns regarding your invoice.   Our billing staff will not be able to assist you with questions regarding bills from these companies.  You will be contacted with the lab results as soon as they are available. The fastest way to get your results is to activate your My Chart account. Instructions are located on the last page of this paperwork. If you have not heard from [place] regarding the results in 2 weeks, please contact this office.

## 2020-09-11 NOTE — Progress Notes (Signed)
Subjective:  Patient ID: [name], male    DOB: 06-03-1941  Age: 80 y.o. MRN: [id number]  CC:  Chief Complaint  Patient presents with  . Follow-up    On prediabetes. Pt reports no changes to his diet. PT reports no physical symptoms of diabetes.   . Urinary Frequency    Pt reports nocturnal urination and burning while urinating. Pt reports no discharge,abnormal urine color, or blood in his urine. Pt also reports a weak urine stream.    HPI [name] presents for   Prediabetes: No change in diet. Less walking with cold weather. Lab Results  Component Value Date   HGBA1C 5.8 (H) 02/25/2020   Wt Readings from Last 3 Encounters:  09/11/20 177 lb (80.3 kg)  08/14/20 176 lb 3.2 oz (79.9 kg)  04/27/20 174 lb (78.9 kg)   Dysuria, Nocturia: Past few months  Nocturia - 1-2 per night.  Intermittent and slowing of stream, no retention.  occasional burning - if trying to hold it.  No hematuria.  No penile d/c, no new sexual contacts. No testicular pain, No fever/abd pain/back pain. Chlorpheniramine increased to 3 pills per day from 1 in past month or so from pulmonary.  Tx: none.   Lab Results  Component Value Date   PSA 1.81 10/26/2013   PSA 1.43 06/01/2012   PSA 2.45 06/04/2011   Fall on ice recently, no residual injuries.  Initial soreness in multiple areas improving. History Patient Active Problem List   Diagnosis Date Noted  . GERD (gastroesophageal reflux disease) 12/15/2017  . Unstable angina (HCC) 12/05/2017  . Angina at rest [facility]) 12/05/2017  . Atypical chest pain   . Dyslipidemia   . COPD not affecting current episode of care [facility])   . Essential hypertension 05/29/2017  . Abnormal nuclear cardiac imaging test 01/14/2017  . Bronchiectasis without complication (HCC) 12/15/2016  . ILD (interstitial lung disease) (HCC) 11/27/2016  . Dizziness 10/21/2016  . Cough variant asthma  vs UACS 07/22/2016  . Obstructive sleep apnea 12/01/2015  . Acute  bronchitis 11/30/2015  . Swelling of lower extremity 10/16/2015  . Benign mole 02/04/2014  . Impaired glucose tolerance 10/26/2013  . NAFLD (nonalcoholic fatty liver disease) 81/82/9937  . Thrombocytopenia (HCC) 10/21/2012  . Exertional angina (HCC) 09/03/2012  . MALARIA 12/05/2009  . COLONIC POLYPS 12/05/2009  . HYPERCHOLESTEROLEMIA 12/05/2009  . ANXIETY 12/05/2009  . HEMORRHOIDS 12/05/2009  . Allergic rhinitis 12/05/2009  . GANGLION CYST, WRIST, LEFT 12/05/2009  . ROTATOR CUFF TEAR 12/05/2009  . OTHER DISORDER OF MUSCLE LIGAMENT AND FASCIA 12/05/2009  . ABNORMAL CHEST XRAY 12/05/2009  . CAD (coronary artery disease) 04/05/2009   Past Medical History:  Diagnosis Date  . Abnormal chest x-ray   . Allergic rhinitis   . Allergy   . Ankylosing spondylitis (HCC)   . Anxiety   . Arthritis   . Colonic polyp   . Coronary atherosclerosis of native coronary artery   . Cough   . Dizziness 10/21/2016  . Ganglion cyst of wrist    left  . Heart attack (HCC) 2008  . Hemorrhoids   . History of malaria   . Hyperlipidemia    with low HDL  . Hypertension   . Kidney stone   . Other disorder of muscle, ligament, and fascia   . Rotator cuff tear   . Swelling of lower extremity 10/16/2015   Past Surgical History:  Procedure Laterality Date  . bicep rupture    . CORONARY ANGIOPLASTY  WITH STENT PLACEMENT    . HEMORRHOID SURGERY  10/10   in [place]  . LEFT HEART CATH AND CORONARY ANGIOGRAPHY N/A 01/14/2017   Procedure: Left Heart Cath and Coronary Angiography;  Surgeon: [name], MD;  Location: [place] CV LAB;  Service: Cardiovascular;  Laterality: N/A;  . LEFT HEART CATHETERIZATION WITH CORONARY ANGIOGRAM N/A 09/03/2012   Procedure: LEFT HEART CATHETERIZATION WITH CORONARY ANGIOGRAM;  Surgeon: [name], MD;  Location: [facility] CATH LAB;  Service: Cardiovascular;  Laterality: N/A;  . ROTATOR CUFF REPAIR     bilat by Dr. French Ana   Allergies  Allergen Reactions  . Celebrex [Celecoxib]      Pt states precipitated his heart attack.  . Tramadol Other (See Comments)    Makes patient jumpy   Prior to Admission medications   Medication Sig Start Date End Date Taking? Authorizing Provider  amLODipine (NORVASC) 5 MG tablet Take 1 tablet (5 mg total) by mouth daily. 01/12/20  Yes [name], MD  Apoaequorin (PREVAGEN PO) Take 1 tablet by mouth daily.    Yes [provider]  aspirin 81 MG EC tablet Take 81 mg by mouth daily.   Yes [provider]  Azelastine-Fluticasone (DYMISTA) 137-50 MCG/ACT SUSP Place 1 puff into the nose 2 (two) times daily. 08/14/20  Yes [name], MD  budesonide-formoterol (SYMBICORT) 80-4.5 MCG/ACT inhaler Take 2 puffs first thing in am and then another 2 puffs about 12 hours later. 08/14/20  Yes [name], MD  chlorpheniramine (CHLOR-TRIMETON) 4 MG tablet 1 -2 every 4 hours as needed 08/14/20  Yes [name], MD  famotidine (PEPCID) 20 MG tablet Take 20 mg by mouth at bedtime.   Yes [provider]  Multiple Vitamins-Minerals (ICAPS AREDS 2) CAPS Take by mouth daily.   Yes [provider]  pantoprazole (PROTONIX) 40 MG tablet TAKE 1 TABLET ONCE DAILY 30 TO 60 MINUTES BEFORE FIRST MEAL OF THE DAY 08/17/20  Yes [name], MD  simvastatin (ZOCOR) 20 MG tablet Take 20 mg by mouth daily.   Yes [provider]  UNABLE TO FIND Med Name: CPAP per Dr Halford Chessman   Yes [provider]   Social History   Socioeconomic History  . Marital status: Married    Spouse name: [name]  . Number of children: 2  . Years of education: Not on file  . Highest education level: Not on file  Occupational History  . Not on file  Tobacco Use  . Smoking status: Never Smoker  . Smokeless tobacco: Never Used  Vaping Use  . Vaping Use: Never used  Substance and Sexual Activity  . Alcohol use: Yes    Comment: holiday Rare   . Drug use: No  . Sexual activity: Not Currently  Other Topics Concern  . Not on file   Social History Narrative  . Not on file   Social Determinants of Health   Financial Resource Strain: Not on file  Food Insecurity: Not on file  Transportation Needs: Not on file  Physical Activity: Not on file  Stress: Not on file  Social Connections: Not on file  Intimate Partner Violence: Not on file    Review of Systems Per HPI.   Objective:   Vitals:   09/11/20 0851  BP: 128/67  Pulse: 72  Temp: 98 F (36.7 C)  TempSrc: Temporal  SpO2: 92%  Weight: 177 lb (80.3 kg)  Height: 5\' 7"  (1.702 m)     Physical Exam Vitals  reviewed.  Constitutional:      Appearance: He is well-developed and well-nourished.  HENT:     Head: Normocephalic and atraumatic.  Eyes:     Extraocular Movements: EOM normal.     Pupils: Pupils are equal, round, and reactive to light.  Neck:     Vascular: No carotid bruit or JVD.  Cardiovascular:     Rate and Rhythm: Normal rate and regular rhythm.     Heart sounds: Normal heart sounds. No murmur heard.   Pulmonary:     Effort: Pulmonary effort is normal.     Breath sounds: Normal breath sounds. No rales.  Genitourinary:    Comments: Prostate diffusely enlarged, more prominent right side with possible firm area/nodule just to the right of the sulcus.  Nontender. Musculoskeletal:        General: No edema.  Skin:    General: Skin is warm and dry.  Neurological:     Mental Status: He is alert and oriented to person, place, and time.  Psychiatric:        Mood and Affect: Mood and affect normal.        Assessment & Plan:  TAISON CELANI is a 80 y.o. male . Dysuria - Plan: PSA, Urine Culture, Ambulatory referral to Urology, doxazosin (CARDURA) 1 MG tablet Abnormality of urination - Plan: POCT urinalysis dipstick, POCT Microscopic Urinalysis ([facility]), PSA, Urine Culture, Ambulatory referral to Urology, doxazosin (CARDURA) 1 MG tablet Nocturia - Plan: PSA, Urine Culture, Ambulatory referral to Urology, doxazosin (CARDURA) 1 MG  tablet Abnormal prostate exam - Plan: Ambulatory referral to Urology  -Few months of symptoms.  Higher dose of Chlor-Trimeton may be contributing, but also appears to have enlarged prostate, possible nodular prostate on the right side.  Few WBCs on urinalysis, but symptoms present for months, unlikely infection.  Check PSA, urine culture, refer to urology.  Trial of slight lower dose of Chlor-Trimeton.  RTC precautions  Prediabetes - Plan: Hemoglobin A1c  -Plan for increased walking with warmer weather, monitor diet.  Check A1c.  Meds ordered this encounter  Medications  . doxazosin (CARDURA) 1 MG tablet    Sig: Take 1 tablet (1 mg total) by mouth daily.    Dispense:  30 tablet    Refill:  1   Patient Instructions    I will refer you to urology and check prostate test today.  Higher dose of chlorpheniramine may affect your ability to urinate.  Could try to decrease dose slightly to see if that helps.  If any new or acute worsening symptoms please be seen right away.  Return to the clinic or go to the nearest emergency room if any of your symptoms worsen or new symptoms occur.    If you have lab work done today you will be contacted with your lab results within the next 2 weeks.  If you have not heard from [place] then please contact us. The fastest way to get your results is to register for My Chart.   IF you received an x-ray today, you will receive an invoice from [facility]iology. Please contact [facility] Radiology at [phone] with questions or concerns regarding your invoice.   IF you received labwork today, you will receive an invoice from [place]. Please contact [facility] at [phone] with questions or concerns regarding your invoice.   Our billing staff will not be able to assist you with questions regarding bills from these companies.  You will be contacted with the lab results as soon  as they are available. The fastest way to get your results is to activate your My  Chart account. Instructions are located on the last page of this paperwork. If you have not heard from [place] regarding the results in 2 weeks, please contact this office.         Signed, Merri Ray, MD [facility]

## 2020-09-12 LAB — HEMOGLOBIN A1C
Est. average glucose Bld gHb Est-mCnc: 123 mg/dL
Hgb A1c MFr Bld: 5.9 % — ABNORMAL HIGH (ref 4.8–5.6)

## 2020-09-12 LAB — URINE CULTURE

## 2020-09-12 LAB — PSA: Prostate Specific Ag, Serum: 3.2 ng/mL (ref 0.0–4.0)

## 2020-09-22 ENCOUNTER — Ambulatory Visit (INDEPENDENT_AMBULATORY_CARE_PROVIDER_SITE_OTHER): Payer: Medicare Other | Admitting: Rheumatology

## 2020-09-22 ENCOUNTER — Other Ambulatory Visit: Payer: Self-pay

## 2020-09-22 ENCOUNTER — Telehealth: Payer: Self-pay | Admitting: Rheumatology

## 2020-09-22 ENCOUNTER — Encounter: Payer: Self-pay | Admitting: Rheumatology

## 2020-09-22 VITALS — BP 122/70 | HR 67 | Resp 15 | Ht 67.0 in | Wt 179.0 lb

## 2020-09-22 DIAGNOSIS — M25571 Pain in right ankle and joints of right foot: Secondary | ICD-10-CM | POA: Diagnosis not present

## 2020-09-22 DIAGNOSIS — K219 Gastro-esophageal reflux disease without esophagitis: Secondary | ICD-10-CM

## 2020-09-22 DIAGNOSIS — M25572 Pain in left ankle and joints of left foot: Secondary | ICD-10-CM

## 2020-09-22 DIAGNOSIS — M67432 Ganglion, left wrist: Secondary | ICD-10-CM

## 2020-09-22 DIAGNOSIS — Z8613 Personal history of malaria: Secondary | ICD-10-CM

## 2020-09-22 DIAGNOSIS — M533 Sacrococcygeal disorders, not elsewhere classified: Secondary | ICD-10-CM | POA: Diagnosis not present

## 2020-09-22 DIAGNOSIS — M19042 Primary osteoarthritis, left hand: Secondary | ICD-10-CM

## 2020-09-22 DIAGNOSIS — M25561 Pain in right knee: Secondary | ICD-10-CM | POA: Diagnosis not present

## 2020-09-22 DIAGNOSIS — I25119 Atherosclerotic heart disease of native coronary artery with unspecified angina pectoris: Secondary | ICD-10-CM

## 2020-09-22 DIAGNOSIS — M7711 Lateral epicondylitis, right elbow: Secondary | ICD-10-CM | POA: Diagnosis not present

## 2020-09-22 DIAGNOSIS — Z86010 Personal history of colonic polyps: Secondary | ICD-10-CM

## 2020-09-22 DIAGNOSIS — J849 Interstitial pulmonary disease, unspecified: Secondary | ICD-10-CM

## 2020-09-22 DIAGNOSIS — M546 Pain in thoracic spine: Secondary | ICD-10-CM | POA: Diagnosis not present

## 2020-09-22 DIAGNOSIS — G4733 Obstructive sleep apnea (adult) (pediatric): Secondary | ICD-10-CM

## 2020-09-22 DIAGNOSIS — M19041 Primary osteoarthritis, right hand: Secondary | ICD-10-CM | POA: Diagnosis not present

## 2020-09-22 DIAGNOSIS — J479 Bronchiectasis, uncomplicated: Secondary | ICD-10-CM | POA: Diagnosis not present

## 2020-09-22 DIAGNOSIS — E785 Hyperlipidemia, unspecified: Secondary | ICD-10-CM

## 2020-09-22 DIAGNOSIS — M545 Low back pain, unspecified: Secondary | ICD-10-CM | POA: Diagnosis not present

## 2020-09-22 DIAGNOSIS — I10 Essential (primary) hypertension: Secondary | ICD-10-CM

## 2020-09-22 DIAGNOSIS — G8929 Other chronic pain: Secondary | ICD-10-CM

## 2020-09-22 DIAGNOSIS — Z8659 Personal history of other mental and behavioral disorders: Secondary | ICD-10-CM

## 2020-09-22 DIAGNOSIS — D696 Thrombocytopenia, unspecified: Secondary | ICD-10-CM | POA: Diagnosis not present

## 2020-09-22 DIAGNOSIS — K760 Fatty (change of) liver, not elsewhere classified: Secondary | ICD-10-CM

## 2020-09-22 DIAGNOSIS — M25562 Pain in left knee: Secondary | ICD-10-CM

## 2020-09-22 NOTE — Telephone Encounter (Signed)
Referral changed to [facility].

## 2020-09-22 NOTE — Telephone Encounter (Signed)
FYI: Patient lives near [place], and requests a Physical Therapist near there. Patient does not wish to go to a location down [place], if possible.

## 2020-09-25 ENCOUNTER — Other Ambulatory Visit: Payer: Self-pay | Admitting: Family Medicine

## 2020-09-25 DIAGNOSIS — R300 Dysuria: Secondary | ICD-10-CM

## 2020-09-25 DIAGNOSIS — R39198 Other difficulties with micturition: Secondary | ICD-10-CM

## 2020-09-25 DIAGNOSIS — R351 Nocturia: Secondary | ICD-10-CM

## 2020-09-25 MED ORDER — DOXAZOSIN MESYLATE 1 MG PO TABS: 1.0000 mg | ORAL_TABLET | Freq: Every day | ORAL | 1 refills | Status: DC

## 2020-09-25 NOTE — Telephone Encounter (Signed)
[facility] pharmacy called stating that the patient wants his prescription for doxazosin (CARDURA) 1 MG tablet to be sent to the pharmacy listed below: [place], [place]  [place] [address]  Phone: [phone] Fax: [phone]    The original prescription was sent into [facility].  [place] ([address] [place] [address] Phone:  [phone]  Fax:  [phone]

## 2020-10-03 ENCOUNTER — Ambulatory Visit: Payer: Medicare Other | Attending: Rheumatology | Admitting: Physical Therapy

## 2020-10-03 ENCOUNTER — Encounter: Payer: Self-pay | Admitting: Physical Therapy

## 2020-10-03 ENCOUNTER — Other Ambulatory Visit: Payer: Self-pay

## 2020-10-03 DIAGNOSIS — G8929 Other chronic pain: Secondary | ICD-10-CM | POA: Diagnosis not present

## 2020-10-03 DIAGNOSIS — M6281 Muscle weakness (generalized): Secondary | ICD-10-CM | POA: Insufficient documentation

## 2020-10-03 DIAGNOSIS — M545 Low back pain, unspecified: Secondary | ICD-10-CM | POA: Insufficient documentation

## 2020-10-03 DIAGNOSIS — M25561 Pain in right knee: Secondary | ICD-10-CM | POA: Diagnosis not present

## 2020-10-03 DIAGNOSIS — M25562 Pain in left knee: Secondary | ICD-10-CM | POA: Insufficient documentation

## 2020-10-03 NOTE — Therapy (Signed)
[place] [place], [place], [address] Phone: [phone]   Fax:  [phone]  Physical Therapy Evaluation  Patient Details  Name: [name] MRN: [id number] Date of Birth: 10/22/1940 Referring Provider (PT): Deveshwar   Encounter Date: 10/03/2020   PT End of Session - 10/03/20 1150    Visit Number 1    Date for PT Re-Evaluation 12/01/20    PT Start Time 0930    PT Stop Time 2637    PT Time Calculation (min) 44 min    Activity Tolerance Patient tolerated treatment well    Behavior During Therapy [facility] for tasks assessed/performed           Past Medical History:  Diagnosis Date  . Abnormal chest x-ray   . Allergic rhinitis   . Allergy   . Ankylosing spondylitis ([place])   . Anxiety   . Arthritis   . Colonic polyp   . Coronary atherosclerosis of native coronary artery   . Cough   . Dizziness 10/21/2016  . Ganglion cyst of wrist    left  . Heart attack ([place]) 2008  . Hemorrhoids   . History of malaria   . Hyperlipidemia    with low HDL  . Hypertension   . Kidney stone   . Other disorder of muscle, ligament, and fascia   . Rotator cuff tear   . Swelling of lower extremity 10/16/2015    Past Surgical History:  Procedure Laterality Date  . bicep rupture    . CORONARY ANGIOPLASTY WITH STENT PLACEMENT    . HEMORRHOID SURGERY  10/10   in [place]  . LEFT HEART CATH AND CORONARY ANGIOGRAPHY N/A 01/14/2017   Procedure: Left Heart Cath and Coronary Angiography;  Surgeon: [name], MD;  Location: [place] [facility];  Service: Cardiovascular;  Laterality: N/A;  . LEFT HEART CATHETERIZATION WITH CORONARY ANGIOGRAM N/A 09/03/2012   Procedure: LEFT HEART CATHETERIZATION WITH CORONARY ANGIOGRAM;  Surgeon: [name], MD;  Location: [facility] CATH LAB;  Service: Cardiovascular;  Laterality: N/A;  . ROTATOR CUFF REPAIR     bilat by Dr. French Ana    There were no vitals filed for this visit.    Subjective  Assessment - 10/03/20 0932    Subjective Pt reports that he has hx of LBP present for years and worsening intermittently. Pt states that LBP goes in waves. Pt has been having generalized joint pain "all over." Pt feels like muscles have been tensing up all over. Pt denies radiating pain into BLE. Denies N/T in feet. Reports occasional numbness in BUE alleviated by movement. Pt reports reaching up and bending over are aggravating factors for back. Pt also reports B knee pain R>L. Has discontinued playing golf d/t back pain. Muscle spasms in LB with sudden movements.    Pertinent History HTN, osteoarthritis    Limitations Lifting;Standing    Diagnostic tests xrays lumbar spine, knees 09/2019    Patient Stated Goals reduce pain in LB    Currently in Pain? Yes    Pain Score 0-No pain    Pain Location Back    Pain Orientation Lower    Pain Descriptors / Indicators Stabbing;Sharp    Pain Type Chronic pain    Pain Onset More than a month ago    Pain Frequency Intermittent    Aggravating Factors  bending, reaching overhead    Pain Relieving Factors rest  [facility] PT Assessment - 10/03/20 0001      Assessment   Medical Diagnosis LBP and knee pain    Referring Provider (PT) Deveshwar    Prior Therapy PT for LBP      Precautions   Precautions None      Restrictions   Weight Bearing Restrictions No      Balance Screen   Has the patient fallen in the past 6 months Yes    How many times? 1   fell on black ice to save coffee   Has the patient had a decrease in activity level because of a fear of falling?  No    Is the patient reluctant to leave their home because of a fear of falling?  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), maintaining rental properties      Sensation   Light  Touch Appears Intact      Functional Tests   Functional tests Sit to Stand      Sit to Stand   Comments WFL, some pain in B knees R>L with increasing reps      Posture/Postural Control   Posture/Postural Control Postural limitations    Postural Limitations Rounded Shoulders;Forward head;Decreased lumbar lordosis      ROM / Strength   AROM / PROM / Strength AROM;Strength      AROM   AROM Assessment Site Lumbar    Lumbar Flexion limited 50% + pain, tight HS    Lumbar Extension limited 25%    Lumbar - Right Side Bend limited 50% + pain    Lumbar - Left Side Bend limited 50% + pain    Lumbar - Right Rotation limited 25%    Lumbar - Left Rotation limited 25%      Strength   Overall Strength Comments BLE strength 5/5 except 4/5 hip abd/ext      Flexibility   Soft Tissue Assessment /Muscle Length yes    Hamstrings very tight B    ITB tight    Piriformis tight      Palpation   Spinal mobility hypomobility of lumbar spine    Palpation comment pain with palpation to lower lumbar paraspinals      Special Tests   Other special tests SLR 50 deg B + pain      Transfers   Five time sit to stand comments  WFL but increased knee pain      Ambulation/Gait   Gait Comments decreased trunk rotation with gait, slight forward trunk flexion                      Objective measurements completed on examination: See above findings.               PT Education - 10/03/20 1144    Education Details Pt educated on POC and HEP    Person(s) Educated Patient    Methods Explanation;Demonstration;Handout    Comprehension Verbalized understanding;Returned demonstration            PT Short Term Goals - 10/03/20 1157      PT SHORT TERM GOAL #1   Title Pt will be I with initial HEP    Time 2    Period Weeks  Status New    Target Date 10/17/20             PT Long Term Goals - 10/03/20 1157      PT LONG TERM GOAL #1   Title Pt will be I with advanced HEP     Time 6    Period Weeks    Status New    Target Date 11/14/20      PT LONG TERM GOAL #2   Title Pt will demo SLR to 80 deg BLE    Baseline 50 deg B    Time 8    Period Weeks    Status New    Target Date 11/28/20      PT LONG TERM GOAL #3   Title Pt will demo B hip abd/ext strength 4+/5    Time 6    Period Weeks    Status New    Target Date 11/14/20      PT LONG TERM GOAL #4   Title Pt will report able to complete ADLs with no increase in LBP    Time 6    Period Weeks    Status New    Target Date 11/14/20      PT LONG TERM GOAL #5   Title Pt will demo lumbar AROM <25% limited with no increase in LBP    Time 6    Period Weeks    Status New    Target Date 11/14/20                  Plan - 10/03/20 1151    Clinical Impression Statement Pt presents to clinic with reports of chronic LBP and B knee pain present for years and worsening intermittently. Pt reports LB muscle spasms with quick movements. Denies radiating pain or N/T in BLE. Demos limited and painful lumbar AROM, tightness of lumbar paraspinals, B hamstring/piriformis/glute med tightness, and B knee pain R>L when rising from low surfaces. Pt has limitations in B hip ext/abd, otherwise LE strength WFL. Pt reports he has had PT for LBP before and combination of soft tissue, mobilization, and exercise was helpful. Pt would benefit from skilled PT to address the above impairments    Personal Factors and Comorbidities Comorbidity 2    Comorbidities HTN, arthritis    Examination-Activity Limitations Lift;Locomotion Level;Stairs;Stand;Bend    Examination-Participation Restrictions Community Activity;Interpersonal Relationship    Stability/Clinical Decision Making Stable/Uncomplicated    Clinical Decision Making Low    Rehab Potential Good    PT Frequency 2x / week    PT Duration 6 weeks    PT Treatment/Interventions ADLs/Self Care Home Management;Electrical Stimulation;Iontophoresis 4mg /ml Dexamethasone;Moist  Heat;Neuromuscular re-education;Therapeutic exercise;Therapeutic activities;Stair training;Patient/family education;Manual techniques;Dry needling;Passive range of motion    PT Next Visit Plan soft tissue lumbar, manual/modalities as indicated, core/lumbar stab, pt [facility]    PT Home Exercise Plan LTR, PPT, bridges, hamstring stretch    Consulted and Agree with Plan of Care Patient           Patient will benefit from skilled therapeutic intervention in order to improve the following deficits and impairments:  Abnormal gait,Decreased range of motion,Difficulty walking,Decreased endurance,Increased muscle spasms,Decreased activity tolerance,Pain,Hypomobility,Impaired flexibility,Decreased strength,Postural dysfunction  Visit Diagnosis: Chronic bilateral low back pain without sciatica  Chronic pain of left knee  Chronic pain of right knee  Muscle weakness (generalized)     Problem List Patient Active Problem List   Diagnosis Date Noted  .  GERD (gastroesophageal reflux disease) 12/15/2017  . Unstable angina ([place]) 12/05/2017  . Angina at rest [facility]) 12/05/2017  . Atypical chest pain   . Dyslipidemia   . COPD not affecting current episode of care [facility])   . Essential hypertension 05/29/2017  . Abnormal nuclear cardiac imaging test 01/14/2017  . Bronchiectasis without complication ([place]) 25/63/8937  . ILD (interstitial lung disease) ([place]) 11/27/2016  . Dizziness 10/21/2016  . Cough variant asthma  vs UACS 07/22/2016  . Obstructive sleep apnea 12/01/2015  . Acute bronchitis 11/30/2015  . Swelling of lower extremity 10/16/2015  . Benign mole 02/04/2014  . Impaired glucose tolerance 10/26/2013  . NAFLD (nonalcoholic fatty liver disease) 04/27/2013  . Thrombocytopenia ([place]) 10/21/2012  . Exertional angina (HCC) 09/03/2012  . MALARIA 12/05/2009  . COLONIC POLYPS 12/05/2009  . HYPERCHOLESTEROLEMIA 12/05/2009  . ANXIETY 12/05/2009  . HEMORRHOIDS  12/05/2009  . Allergic rhinitis 12/05/2009  . GANGLION CYST, WRIST, LEFT 12/05/2009  . ROTATOR CUFF TEAR 12/05/2009  . OTHER DISORDER OF MUSCLE LIGAMENT AND FASCIA 12/05/2009  . ABNORMAL CHEST XRAY 12/05/2009  . CAD (coronary artery disease) 04/05/2009   Amador Cunas, PT, DPT Donald Prose Veleta Yamamoto 10/03/2020, 11:59 AM  [place] [place], [place], [address] Phone: [phone]   Fax:  [phone]  Name: BENI TURRELL MRN: [id number] Date of Birth: March 05, 1941

## 2020-10-10 ENCOUNTER — Other Ambulatory Visit: Payer: Self-pay

## 2020-10-10 ENCOUNTER — Ambulatory Visit: Payer: Medicare Other | Attending: Rheumatology | Admitting: Physical Therapy

## 2020-10-10 DIAGNOSIS — M25561 Pain in right knee: Secondary | ICD-10-CM | POA: Diagnosis not present

## 2020-10-10 DIAGNOSIS — G8929 Other chronic pain: Secondary | ICD-10-CM | POA: Insufficient documentation

## 2020-10-10 DIAGNOSIS — M545 Low back pain, unspecified: Secondary | ICD-10-CM | POA: Diagnosis not present

## 2020-10-10 DIAGNOSIS — M6281 Muscle weakness (generalized): Secondary | ICD-10-CM | POA: Insufficient documentation

## 2020-10-10 DIAGNOSIS — M25562 Pain in left knee: Secondary | ICD-10-CM | POA: Insufficient documentation

## 2020-10-10 NOTE — Therapy (Signed)
[place] [place], [place], [address] Phone: [phone]   Fax:  [phone]  Physical Therapy Treatment  Patient Details  Name: [name] MRN: [id number] Date of Birth: March 23, 1941 Referring Provider (PT): Deveshwar   Encounter Date: 10/10/2020   PT End of Session - 10/10/20 0925    Visit Number 2    Date for PT Re-Evaluation 12/01/20    PT Start Time 0835    PT Stop Time 0935    PT Time Calculation (min) 60 min           Past Medical History:  Diagnosis Date  . Abnormal chest x-ray   . Allergic rhinitis   . Allergy   . Ankylosing spondylitis ([place])   . Anxiety   . Arthritis   . Colonic polyp   . Coronary atherosclerosis of native coronary artery   . Cough   . Dizziness 10/21/2016  . Ganglion cyst of wrist    left  . Heart attack ([place]) 2008  . Hemorrhoids   . History of malaria   . Hyperlipidemia    with low HDL  . Hypertension   . Kidney stone   . Other disorder of muscle, ligament, and fascia   . Rotator cuff tear   . Swelling of lower extremity 10/16/2015    Past Surgical History:  Procedure Laterality Date  . bicep rupture    . CORONARY ANGIOPLASTY WITH STENT PLACEMENT    . HEMORRHOID SURGERY  10/10   in [place]  . LEFT HEART CATH AND CORONARY ANGIOGRAPHY N/A 01/14/2017   Procedure: Left Heart Cath and Coronary Angiography;  Surgeon: [name], MD;  Location: [facility];  Service: Cardiovascular;  Laterality: N/A;  . LEFT HEART CATHETERIZATION WITH CORONARY ANGIOGRAM N/A 09/03/2012   Procedure: LEFT HEART CATHETERIZATION WITH CORONARY ANGIOGRAM;  Surgeon: [name], MD;  Location: [facility] CATH LAB;  Service: Cardiovascular;  Laterality: N/A;  . ROTATOR CUFF REPAIR     bilat by Dr. French Ana    There were no vitals filed for this visit.   Subjective Assessment - 10/10/20 0837    Subjective pt denies any pain this morning. pt stataes doing ex given from MD    Currently in  Pain? No/denies                             [facility] Adult PT Treatment/Exercise - 10/10/20 0001      Exercises   Exercises Lumbar;Knee/Hip      Lumbar Exercises: Stretches   Active Hamstring Stretch Right;Left;20 seconds;3 reps   seated and supine     Lumbar Exercises: Aerobic   Nustep L 5 5 66min      Lumbar Exercises: Machines for Strengthening   Cybex Lumbar Extension tband 2 sets10    Other Lumbar Machine Exercise row and lats 20# 2 sets 10      Lumbar Exercises: Standing   Other Standing Lumbar Exercises hip ext and abd red tband 10 BIL with UE support      Lumbar Exercises: Seated   Sit to Stand 10 reps   with wt ball     Lumbar Exercises: Supine   Ab Set 15 reps;3 seconds   with pball   Bridge with [employer];15 reps    Bridge with [employer] Compliant;15 reps   tband   Bridge with [employer];20 reps   tband   Other Supine Lumbar  Exercises feet on ball bridge 15x hold 3 sec   feet on ball KTC and obl 15x each     Manual Therapy   Manual Therapy Passive ROM;Soft tissue mobilization    Manual therapy comments BIL HS tightness RT > Left    Soft tissue mobilization bilateral lumbar paraspinals into the thoracic area    Passive ROM LE and trunk            Trigger Point Dry Needling - 10/10/20 0001    Consent Given? Yes    Education Handout Provided Yes    Muscles Treated Back/Hip Erector spinae    Erector spinae Response Twitch response elicited;Palpable increased muscle length                PT Education - 10/10/20 0924    Education Details HS stretching supine and seated    Person(s) Educated Patient    Methods Explanation;Demonstration    Comprehension Verbalized understanding;Returned demonstration            PT Short Term Goals - 10/10/20 0926      PT SHORT TERM GOAL #1   Title Pt will be I with initial HEP    Status Achieved             PT Long Term Goals - 10/03/20 1157      PT LONG TERM GOAL #1    Title Pt will be I with advanced HEP    Time 6    Period Weeks    Status New    Target Date 11/14/20      PT LONG TERM GOAL #2   Title Pt will demo SLR to 80 deg BLE    Baseline 50 deg B    Time 8    Period Weeks    Status New    Target Date 11/28/20      PT LONG TERM GOAL #3   Title Pt will demo B hip abd/ext strength 4+/5    Time 6    Period Weeks    Status New    Target Date 11/14/20      PT LONG TERM GOAL #4   Title Pt will report able to complete ADLs with no increase in LBP    Time 6    Period Weeks    Status New    Target Date 11/14/20      PT LONG TERM GOAL #5   Title Pt will demo lumbar AROM <25% limited with no increase in LBP    Time 6    Period Weeks    Status New    Target Date 11/14/20                 Plan - 10/10/20 0925    Clinical Impression Statement pt tolerated initial ther ex progression well but cuing for core activation. PROM with BIL HS tightness- educ on HS stretching at home. added DN as he c/o "locking" up in spine and paraspinals were very tight    PT Treatment/Interventions ADLs/Self Care Home Management;Electrical Stimulation;Iontophoresis 4mg /ml Dexamethasone;Moist Heat;Neuromuscular re-education;Therapeutic exercise;Therapeutic activities;Stair training;Patient/family education;Manual techniques;Dry needling;Passive range of motion    PT Next Visit Plan assess and progress           Patient will benefit from skilled therapeutic intervention in order to improve the following deficits and impairments:  Abnormal gait,Decreased range of motion,Difficulty walking,Decreased endurance,Increased muscle spasms,Decreased activity tolerance,Pain,Hypomobility,Impaired flexibility,Decreased strength,Postural dysfunction  Visit Diagnosis: Chronic bilateral low back pain without sciatica  Muscle weakness (generalized)     Problem List Patient Active Problem List   Diagnosis Date Noted  . GERD (gastroesophageal reflux disease)  12/15/2017  . Unstable angina ([place]) 12/05/2017  . Angina at rest [facility]) 12/05/2017  . Atypical chest pain   . Dyslipidemia   . COPD not affecting current episode of care [facility])   . Essential hypertension 05/29/2017  . Abnormal nuclear cardiac imaging test 01/14/2017  . Bronchiectasis without complication ([place]) 19/50/9326  . ILD (interstitial lung disease) ([place]) 11/27/2016  . Dizziness 10/21/2016  . Cough variant asthma  vs UACS 07/22/2016  . Obstructive sleep apnea 12/01/2015  . Acute bronchitis 11/30/2015  . Swelling of lower extremity 10/16/2015  . Benign mole 02/04/2014  . Impaired glucose tolerance 10/26/2013  . NAFLD (nonalcoholic fatty liver disease) 04/27/2013  . Thrombocytopenia ([place]) 10/21/2012  . Exertional angina (H[facility]) 09/03/2012  . MALARIA 12/05/2009  . COLONIC POLYPS 12/05/2009  . HYPERCHOLESTEROLEMIA 12/05/2009  . ANXIETY 12/05/2009  . HEMORRHOIDS 12/05/2009  . Allergic rhinitis 12/05/2009  . GANGLION CYST, WRIST, LEFT 12/05/2009  . ROTATOR CUFF TEAR 12/05/2009  . OTHER DISORDER OF MUSCLE LIGAMENT AND FASCIA 12/05/2009  . ABNORMAL CHEST XRAY 12/05/2009  . CAD (coronary artery disease) 04/05/2009    Sumner Boast PTA 10/10/2020, 9:33 AM  [place] [address]. [place], [place], [address] Phone: [phone]   Fax:  [phone]  Name: [name] MRN: [id number] Date of Birth: [birth year]

## 2020-10-10 NOTE — Patient Instructions (Signed)

## 2020-10-23 ENCOUNTER — Ambulatory Visit: Payer: Medicare Other | Admitting: Physical Therapy

## 2020-10-23 ENCOUNTER — Encounter: Payer: Self-pay | Admitting: Physical Therapy

## 2020-10-23 ENCOUNTER — Other Ambulatory Visit: Payer: Self-pay

## 2020-10-23 DIAGNOSIS — M25561 Pain in right knee: Secondary | ICD-10-CM | POA: Diagnosis not present

## 2020-10-23 DIAGNOSIS — G8929 Other chronic pain: Secondary | ICD-10-CM | POA: Diagnosis not present

## 2020-10-23 DIAGNOSIS — M6281 Muscle weakness (generalized): Secondary | ICD-10-CM

## 2020-10-23 DIAGNOSIS — M25562 Pain in left knee: Secondary | ICD-10-CM | POA: Diagnosis not present

## 2020-10-23 DIAGNOSIS — M545 Low back pain, unspecified: Secondary | ICD-10-CM | POA: Diagnosis not present

## 2020-10-23 NOTE — Therapy (Signed)
[place]. [place], [place], [address] Phone: [phone]   Fax:  [phone]  Physical Therapy Treatment  Patient Details  Name: [name] MRN: [id number] Date of Birth: 24-Mar-1941 Referring Provider (PT): Deveshwar   Encounter Date: 10/23/2020   PT End of Session - 10/23/20 0931    Visit Number 3    Date for PT Re-Evaluation 12/01/20    PT Start Time 0849    PT Stop Time 0931    PT Time Calculation (min) 42 min    Activity Tolerance Patient tolerated treatment well    Behavior During Therapy [facility] for tasks assessed/performed           Past Medical History:  Diagnosis Date  . Abnormal chest x-ray   . Allergic rhinitis   . Allergy   . Ankylosing spondylitis ([place])   . Anxiety   . Arthritis   . Colonic polyp   . Coronary atherosclerosis of native coronary artery   . Cough   . Dizziness 10/21/2016  . Ganglion cyst of wrist    left  . Heart attack ([place]) 2008  . Hemorrhoids   . History of malaria   . Hyperlipidemia    with low HDL  . Hypertension   . Kidney stone   . Other disorder of muscle, ligament, and fascia   . Rotator cuff tear   . Swelling of lower extremity 10/16/2015    Past Surgical History:  Procedure Laterality Date  . bicep rupture    . CORONARY ANGIOPLASTY WITH STENT PLACEMENT    . HEMORRHOID SURGERY  10/10   in [place]  . LEFT HEART CATH AND CORONARY ANGIOGRAPHY N/A 01/14/2017   Procedure: Left Heart Cath and Coronary Angiography;  Surgeon: [name], MD;  Location: [place] [facility];  Service: Cardiovascular;  Laterality: N/A;  . LEFT HEART CATHETERIZATION WITH CORONARY ANGIOGRAM N/A 09/03/2012   Procedure: LEFT HEART CATHETERIZATION WITH CORONARY ANGIOGRAM;  Surgeon: [name], MD;  Location: [facility] CATH LAB;  Service: Cardiovascular;  Laterality: N/A;  . ROTATOR CUFF REPAIR     bilat by Dr. French Ana    There were no vitals filed for this visit.   Subjective Assessment  - 10/23/20 0850    Subjective Pt denies any pain this morning    Currently in Pain? No/denies    Pain Score 0-No pain    Pain Location Back                             [facility] Adult PT Treatment/Exercise - 10/23/20 0001      Lumbar Exercises: Stretches   Active Hamstring Stretch Right;Left;2 reps;20 seconds    Piriformis Stretch Right;Left;2 reps;20 seconds   seated     Lumbar Exercises: Aerobic   UBE (Upper Arm Bike) L 1.5 x2 min each    Nustep L5 x 5 min      Lumbar Exercises: Machines for Strengthening   Cybex Lumbar Extension black tband 2 sets10    Cybex Knee Extension 5# 2x10 BLE    Cybex Knee Flexion 20# 2x10 BLE    Other Lumbar Machine Exercise rows 20# 2x10, 25# 1x10, lats 25# 2x10    Other Lumbar Machine Exercise standing shoulder ext 10# 2x10; AR press 10# 1x15 B      Lumbar Exercises: Seated   Sit to Stand Limitations 2x10 with yellow ball chest press and OHP  PT Short Term Goals - 10/10/20 0926      PT SHORT TERM GOAL #1   Title Pt will be I with initial HEP    Status Achieved             PT Long Term Goals - 10/03/20 1157      PT LONG TERM GOAL #1   Title Pt will be I with advanced HEP    Time 6    Period Weeks    Status New    Target Date 11/14/20      PT LONG TERM GOAL #2   Title Pt will demo SLR to 80 deg BLE    Baseline 50 deg B    Time 8    Period Weeks    Status New    Target Date 11/28/20      PT LONG TERM GOAL #3   Title Pt will demo B hip abd/ext strength 4+/5    Time 6    Period Weeks    Status New    Target Date 11/14/20      PT LONG TERM GOAL #4   Title Pt will report able to complete ADLs with no increase in LBP    Time 6    Period Weeks    Status New    Target Date 11/14/20      PT LONG TERM GOAL #5   Title Pt will demo lumbar AROM <25% limited with no increase in LBP    Time 6    Period Weeks    Status New    Target Date 11/14/20                 Plan -  10/23/20 0931    Clinical Impression Statement Pt demos no increased LBP with progression of machine interventions this rx. Cuing to improve posture/form with standing shoulder extensions. Pt reporting tightness/pain in mid back and states no improvement; recommended 1x/week for PT, pt reports every other week works better for his schedule. Plan to progress HEP next rx.    PT Treatment/Interventions ADLs/Self Care Home Management;Electrical Stimulation;Iontophoresis 4mg /ml Dexamethasone;Moist Heat;Neuromuscular re-education;Therapeutic exercise;Therapeutic activities;Stair training;Patient/family education;Manual techniques;Dry needling;Passive range of motion    PT Next Visit Plan progress HEP    Consulted and Agree with Plan of Care Patient           Patient will benefit from skilled therapeutic intervention in order to improve the following deficits and impairments:  Abnormal gait,Decreased range of motion,Difficulty walking,Decreased endurance,Increased muscle spasms,Decreased activity tolerance,Pain,Hypomobility,Impaired flexibility,Decreased strength,Postural dysfunction  Visit Diagnosis: Chronic bilateral low back pain without sciatica  Muscle weakness (generalized)  Chronic pain of left knee  Chronic pain of right knee     Problem List Patient Active Problem List   Diagnosis Date Noted  . GERD (gastroesophageal reflux disease) 12/15/2017  . Unstable angina ([place]) 12/05/2017  . Angina at rest [facility]) 12/05/2017  . Atypical chest pain   . Dyslipidemia   . COPD not affecting current episode of care [facility])   . Essential hypertension 05/29/2017  . Abnormal nuclear cardiac imaging test 01/14/2017  . Bronchiectasis without complication ([place]) 53/61/4431  . ILD (interstitial lung disease) ([place]) 11/27/2016  . Dizziness 10/21/2016  . Cough variant asthma  vs UACS 07/22/2016  . Obstructive sleep apnea 12/01/2015  . Acute bronchitis 11/30/2015  . Swelling of lower extremity 10/16/2015   . Benign mole 02/04/2014  . Impaired glucose tolerance 10/26/2013  . NAFLD (nonalcoholic fatty liver disease) 04/27/2013  . Thrombocytopenia (  [place]) 10/21/2012  . Exertional angina ([facility]) 09/03/2012  . MALARIA 12/05/2009  . COLONIC POLYPS 12/05/2009  . HYPERCHOLESTEROLEMIA 12/05/2009  . ANXIETY 12/05/2009  . HEMORRHOIDS 12/05/2009  . Allergic rhinitis 12/05/2009  . GANGLION CYST, WRIST, LEFT 12/05/2009  . ROTATOR CUFF TEAR 12/05/2009  . OTHER DISORDER OF MUSCLE LIGAMENT AND FASCIA 12/05/2009  . ABNORMAL CHEST XRAY 12/05/2009  . CAD (coronary artery disease) 04/05/2009   Amador Cunas, PT, DPT Donald Prose Hollan Philipp 10/23/2020, 9:42 AM  [place]. [place], [place], [address] Phone: [phone]   Fax:  [phone]  Name: [name] MRN: [id number] Date of Birth: Nov 01, 1940

## 2020-10-24 DIAGNOSIS — D229 Melanocytic nevi, unspecified: Secondary | ICD-10-CM | POA: Diagnosis not present

## 2020-10-24 DIAGNOSIS — Z85828 Personal history of other malignant neoplasm of skin: Secondary | ICD-10-CM | POA: Diagnosis not present

## 2020-10-24 DIAGNOSIS — L814 Other melanin hyperpigmentation: Secondary | ICD-10-CM | POA: Diagnosis not present

## 2020-10-24 DIAGNOSIS — L821 Other seborrheic keratosis: Secondary | ICD-10-CM | POA: Diagnosis not present

## 2020-10-24 DIAGNOSIS — L570 Actinic keratosis: Secondary | ICD-10-CM | POA: Diagnosis not present

## 2020-10-24 DIAGNOSIS — L905 Scar conditions and fibrosis of skin: Secondary | ICD-10-CM | POA: Diagnosis not present

## 2020-10-25 ENCOUNTER — Other Ambulatory Visit: Payer: Self-pay | Admitting: Cardiovascular Disease

## 2020-11-06 ENCOUNTER — Ambulatory Visit: Payer: Medicare Other | Admitting: Physical Therapy

## 2020-11-07 DIAGNOSIS — N5201 Erectile dysfunction due to arterial insufficiency: Secondary | ICD-10-CM | POA: Diagnosis not present

## 2020-11-07 DIAGNOSIS — N403 Nodular prostate with lower urinary tract symptoms: Secondary | ICD-10-CM | POA: Diagnosis not present

## 2020-11-07 DIAGNOSIS — R3912 Poor urinary stream: Secondary | ICD-10-CM | POA: Diagnosis not present

## 2020-11-07 DIAGNOSIS — N401 Enlarged prostate with lower urinary tract symptoms: Secondary | ICD-10-CM | POA: Diagnosis not present

## 2020-11-07 DIAGNOSIS — N281 Cyst of kidney, acquired: Secondary | ICD-10-CM | POA: Diagnosis not present

## 2020-11-17 ENCOUNTER — Other Ambulatory Visit: Payer: Self-pay | Admitting: Family Medicine

## 2020-11-17 DIAGNOSIS — R39198 Other difficulties with micturition: Secondary | ICD-10-CM

## 2020-11-17 DIAGNOSIS — R351 Nocturia: Secondary | ICD-10-CM

## 2020-11-17 DIAGNOSIS — R300 Dysuria: Secondary | ICD-10-CM

## 2020-11-17 NOTE — Telephone Encounter (Signed)
Requested medications are due for refill today.  yes  Requested medications are on the active medications list.  yes  Last refill. 09/25/2020  Future visit scheduled.   yes  Notes to clinic.

## 2020-12-04 ENCOUNTER — Other Ambulatory Visit: Payer: Self-pay | Admitting: Internal Medicine

## 2020-12-05 NOTE — Telephone Encounter (Signed)
Pt is requesting Pantoprazole 40 MG this medication was not mentioned in the last office note, how would you like to go about this?

## 2020-12-07 ENCOUNTER — Telehealth: Payer: Self-pay | Admitting: Cardiovascular Disease

## 2020-12-07 MED ORDER — AMLODIPINE BESYLATE 5 MG PO TABS: 5.0000 mg | ORAL_TABLET | Freq: Every day | ORAL | 0 refills | Status: DC

## 2020-12-07 NOTE — Telephone Encounter (Signed)
*  STAT* If patient is at the pharmacy, call can be transferred to refill team.   1. Which medications need to be refilled? (please list name of each medication and dose if known) amLODipine (NORVASC) 5 MG tablet  2. Which pharmacy/location (including street and city if local pharmacy) is medication to be sent to? [place] ([place]), [place] - [address]  3. Do they need a 30 day or 90 day supply? Pt only needs a 5-10 day supply until his meds come from [facility] mail order.   Pt is completely out of this medicine

## 2020-12-07 NOTE — Telephone Encounter (Signed)
Pt's medication was sent to pt's pharmacy as requested. Confirmation received.  °

## 2020-12-12 DIAGNOSIS — D075 Carcinoma in situ of prostate: Secondary | ICD-10-CM | POA: Diagnosis not present

## 2020-12-12 DIAGNOSIS — R3912 Poor urinary stream: Secondary | ICD-10-CM | POA: Diagnosis not present

## 2020-12-12 DIAGNOSIS — N403 Nodular prostate with lower urinary tract symptoms: Secondary | ICD-10-CM | POA: Diagnosis not present

## 2020-12-12 DIAGNOSIS — C61 Malignant neoplasm of prostate: Secondary | ICD-10-CM | POA: Diagnosis not present

## 2020-12-19 DIAGNOSIS — N281 Cyst of kidney, acquired: Secondary | ICD-10-CM | POA: Diagnosis not present

## 2020-12-19 DIAGNOSIS — N403 Nodular prostate with lower urinary tract symptoms: Secondary | ICD-10-CM | POA: Diagnosis not present

## 2020-12-19 DIAGNOSIS — C61 Malignant neoplasm of prostate: Secondary | ICD-10-CM | POA: Diagnosis not present

## 2020-12-19 DIAGNOSIS — R3912 Poor urinary stream: Secondary | ICD-10-CM | POA: Diagnosis not present

## 2020-12-19 DIAGNOSIS — N5201 Erectile dysfunction due to arterial insufficiency: Secondary | ICD-10-CM | POA: Diagnosis not present

## 2021-01-25 ENCOUNTER — Other Ambulatory Visit: Payer: Self-pay | Admitting: Cardiovascular Disease

## 2021-02-13 ENCOUNTER — Ambulatory Visit: Payer: Medicare Other | Admitting: Internal Medicine

## 2021-02-13 NOTE — Progress Notes (Deleted)
Brief patient profile:  44  yowm never smoker with ankylosing spondilitis  with new onset variable  cough since Oct 2016 comes and goes s background of asthma but with background of nasal allergies for which took shots and moved residence in 1980s  >  No further problems except nasal drip /[place] then fine [place] stopped going there.    History of Present Illness  Congestion in chest x Oct 2016 worse in evenings with mostly dry coughing and much worse x sev weeks > Sood eval on 07/15/16 cough for a week/ albterol and pred no better  rec Advair one puff twice per day until sample completed >> call if you feel you need a refill after this Zithromax 250 mg pill >> 2 pills on day 1, then 1 pill daily for next four days Albuterol 2 puffs every 4 to 6 hours as needed for cough, wheeze, or chest congestion Finish course of prednisone    07/22/2016 acute extended ov/Chelsee Hosie re:  Refractory cough > sob on advair and pred taper and getting worse Chief Complaint  Patient presents with   Acute Visit    Increased SOB and cough are not improving since acute ov here 07/15/16. His cough has been occ prod with light colored sputum.  He states that his breathing does okay in the am's and then gets worse in the evenings.   on advair dpi/ benzoate and took pred 10 mg one tablet  This am and cough seems ok when he wakes up and progressively worse with Minimal actual sputum production as the day goes on and then does not really bother him while sleeping.. Shaking on details of care/ names of meds/ no better with saba  rec Take delsym two tsp every 12 hours and supplement if needed with  tramadol 50 mg up to 1 every 4 hours  .   Symbicort 80 Take 2 puffs first thing in am and then another 2 puffs about 12 hours later - if helping refill it Only use your albuterol (ventolin) inhaler  As long as coughing at all:  Try prilosec otc 20mg   Take 30-60 min before first meal of the day and  Pepcid ac (famotidine) 20 mg one @  bedtime until cough is completely gone for at least a week without the need for cough suppression GERD diet    07/30/2016 acute extended ov/Jorgia Manthei re: refractory cough since late Nov 2017  Chief Complaint  Patient presents with   Acute Visit    Pt c/o increased cough and congestion for the past 2 wks. He states symptoms get worse in the evening. Cough is prod in the am's with thick, clear sputum.  He states he can not lie down flat due to cough and cough sometimes waked him up. He also c/o wheezing.   brought all his meds, not using symb 2bid per count, only missing 11 tramadol "it's for pain, right? "  Some better while on pred, worse since finished it/ only taking prilosec otc 20 mg qam / is taking pepcid 20 mg hs  Not using any saba "it's for emergencies, right?"  rec Prednisone 10 mg take  4 each am x 2 days,   2 each am x 2 days,  1 each am x 2 days and stop  Increase the prilosec ac to where you take x 2 30-60 mn before breakfast  And continue the pepcid ac 20mg  in evening  Continue symbicort 80 (same  as dulera 100 sample) Take 2 puffs first thing in am and then another 2 puffs about 12 hours later.  Only use your albuterol as a rescue medication  For cough use mucinex dm 1200 mg every 12 hours and supplement with the tramadol up to 2 every 4 hours until no cough x 3 days  Please see patient coordinator before you leave today  to schedule sinus CT    08/21/16  Restart Symbicort 2 puffs Twice daily  .  Restart Prilosec 20mg  daily before meal  Restart Pepcid 20mg  At bedtime   May use Zyrtec 10mg  At bedtime  As needed  Drainage  Zpack take as directed.  Mucinex DM Twice daily  As needed  Cough/congestion .     10/10/2016 acute extended ov/Dealie Koelzer re:  uacs vs asthma  maint on symb 80 2bid / gerd rx with otcs only  Chief Complaint  Patient presents with   Acute Visit    Pt c/o cough and increased SOB x 1 wk, and progressively worse over the past 3-4  days. His cough is prod with thick, clear sputum. He states can not lie down flat without cough.   sensation of something stuck never gone away since = oct 2016 then acutely worse sob/ cough x 1 week prior to OV  And not better p saba Does not have mucinex dm / no zyrtec  rec First take delsym two tsp every 12 hours and supplement if needed with  tramadol 50 mg up to 2 every 4 hours to suppress the urge to cough at all or even clear your throat. Swallowing water or using ice chips/non mint and menthol containing candies (such as lifesavers or sugarless jolly ranchers) are also effective.  You should rest your voice and avoid activities that you know make you cough. Once you have eliminated the cough for 3 straight days try reducing the tramadol first,  then the delsym as tolerated.   Prednisone 10 mg take  4 each am x 2 days,   2 each am x 2 days,  1 each am x 2 days and stop (this is to eliminate allergies and inflammation from coughing) Protonix (pantoprazole) Take 30-60 min before first meal of the day and Pepcid 20 mg one bedtime plus chlorpheniramine 4 mg x 2 at bedtime (both available over the counter)  until cough is completely gone for at least a week without the need for cough suppression For drainage / throat tickle try take CHLORPHENIRAMINE  4 mg - take one every 4 hours as needed - available over the counter- may cause drowsiness so start with just a bedtime dose or two and see how you tolerate it before trying in daytime   GERD diet  Stop prilosec/ mucinex  Please remember to go to the lab  department downstairs in the basement  for your tests - we will call you with the results when they are available.  Please schedule a follow up office visit in 2 weeks, sooner if needed  with all medications /inhalers/ solutions in hand so we can verify exactly what you are taking. This includes all medications from all doctors and over the counters Add did not got to lab as rec    10/17/2016  f/u  ov/Eryca Bolte re: intermittent cough x oct 2016 def gets better on prednisone / sense of chest congestion but no excess mucus Chief Complaint  Patient presents with   Follow-up    PFT's done today.  symptoms of daily  cough  worse p supper Did not bring meds as requested / not clear he even read them when reading them back line by line  Not using any albuterol "doesn't help"  Not able to lie down due to cough x month x no better even while on prednisone though "80% better" on pred vs off it/ no longer using cpap rec Add singulair each evening 10 daily trial basis  Please remember to go to the lab  department downstairs in the basement  for your tests - we will call you with the results when they are available. See Tammy NP in 4  weeks with all your medications, same page with your medication use she will arrange follow up with me or with allergy depending on your tests  Add : consider adding flutter if doesn't have one and HRCT looking for bronchiectasis > POS    05/19/2018  f/u ov/Ileen Kahre re: bronchiectasis/ chronic cough/ never took demerol, only took a few prednisone > 95% resolution at med cal ov 03/03/18 so d/c'd singulair and not taking symb 80 as maint (brought med calendar/ not following as written) Chief Complaint  Patient presents with   Follow-up    increased chest congestion and cough x 3-4 wks. He is coughing up some cream colored sputum.   Dyspnea:  Still able to jog / steps ok   Cough: was better for months but never completely gone then worse x 4 weeks esp  In am Sleeping: 2- 3 pillows flat bed - no bed blocks SABA use: not using at  02: none  rec rec Bronchiectasis =   you have scarring of your bronchial tubes  Whenever you develop cough congestion take  mucinex dm up to 1200 mg every 12 hours  >  Symbicort is supposed to be 1-2 puffs 1st thing in am and repeat  every 12 hours if you think you need it or not If cough getting worse >  Prednisone 10 mg take  4 each am x 2 days,   2  each am x 2 days,  1 each am x 2 days and stop  Consider placing 6 in bed blocks under the head of your bed (available at most mattress stores)       06/19/2018  f/u ov/Beau Vanduzer re: bronchiectasis/ did not follow any of the action plans on med calendar he brought with him , still on symb 80 just 1 bid despite flare of cough/ not using mucienx dm or flutter approp Chief Complaint  Patient presents with   Follow-up    non prod cough x 1 wk.  He has not had to use his prn pred or albuterol.   Dyspnea:  Still able to jog and go up to steps Cough: non prod cough  Worse when wake up but non productive  Sleeping: 3 pillows no bed blocks SABA use: no  rec Symbicort 80 should be 2  Every 12 hours unless you are doing great See calendar for specific medication instructions      09/21/2018  f/u ov/Avenir Lozinski re: Bronchiectasis  Chief Complaint  Patient presents with   Follow-up    Breathing is overall doing well. He has not had to use the prn pred taper or albuterol.   Dyspnea:  Walks a mile with the dog / up some up some hills  Cough: gone now  Sleeping: to pee/ special pillow, no bed blocks  SABA use: none  rec Again I recommend the 6-8 inch bed under the  head of the bed We changed the symbicort 80 to where you only take it up to 2 puffs every 12 hours for cough/ congestion as needed as your "plan A"    07/29/2019  f/u ov/Lis Savitt re: Bronchiectasis/ Ankylosing spondylitis - using rare symb 80 2 bid prn (still on samples) Chief Complaint  Patient presents with   Follow-up    PFT done. Breathing is unchanged since the last visit. No new co's.   Dyspnea:  Physically active but  no longer regular walks - more limited by back pain  Cough: resolved Sleeping: bed blocks/cpap / no resp problems  SABA use: none  02: no  rec Stay as physically active as your back will let you  We will be referring you back to Dr Estanislado Pandy Please remember to go to the  x-ray department >   Chronic elevation of RIGHT  diaphragm with chronic bronchitic changes and RIGHT basilar atelectasis   08/14/2020  f/u ov/Kaleyah Labreck re: bronchiectasis/ Ankylosing spondylitis - off symbicort x 6 months  Chief Complaint  Patient presents with   Follow-up    Seems to be ok other than a little nasal drainage  Dyspnea:  Not limited by breathing / walking dog some hills but tired/breathing more difficult ? Since off symbicort Cough: assoc with pnds/ watery ant nasal d/c  Sleeping: does fine p chlortab hs but never took more than one in 24 h (no the instruction on med calendar) SABA use: as needed  02: not using  Rec Maintenance/automatically Restart symbicort 80 Take 2 puffs first thing in am and then another 2 puffs about 12 hours later - blow out thru nose  Restart dysmista one twice daily  As needed  For drainage / throat tickle try take CHLORPHENIRAMINE  4 mg    02/13/2021  f/u ov/Nikitas Davtyan re: bronchiectasis/ ankylosing spondylitis  No chief complaint on file.   Dyspnea:  *** Cough: *** Sleeping: *** SABA use: *** 02: *** Covid status:   ***   No obvious day to day or daytime variability or assoc excess/ purulent sputum or mucus plugs or hemoptysis or cp or chest tightness, subjective wheeze or overt sinus or hb symptoms.   *** without nocturnal  or early am exacerbation  of respiratory  c/o's or need for noct saba. Also denies any obvious fluctuation of symptoms with weather or environmental changes or other aggravating or alleviating factors except as outlined above   No unusual exposure hx or h/o childhood pna/ asthma or knowledge of premature birth.  Current Allergies, Complete Past Medical History, Past Surgical History, Family History, and Social History were reviewed in [employer] record.  ROS  The following are not active complaints unless bolded Hoarseness, sore throat, dysphagia, dental problems, itching, sneezing,  nasal congestion or discharge of excess mucus or purulent secretions,  ear ache,   fever, chills, sweats, unintended wt loss or wt gain, classically pleuritic or exertional cp,  orthopnea pnd or arm/hand swelling  or leg swelling, presyncope, palpitations, abdominal pain, anorexia, nausea, vomiting, diarrhea  or change in bowel habits or change in bladder habits, change in stools or change in urine, dysuria, hematuria,  rash, arthralgias, visual complaints, headache, numbness, weakness or ataxia or problems with walking or coordination,  change in mood or  memory.        No outpatient medications have been marked as taking for the 02/13/21 encounter (Appointment) with [name], MD.  Pulmonary tests CT angio chest 11/30/15 >> motion artifact, mosaic pattern, calcified granuloma SNIFF test 11/30/15 >> elevated Rt diaphragm, but has b/l diaphragm movement  Cardiac tests 10/16/15 Doppler legs b/l >> no DVT 11/30/15 Echo >> EF 65 to 84%, grade 1 diastolic CHF  Sleep tests HST 12/21/15 >> AHI 44.5, SpO2 low 64% Auto CPAP 03/02/16 to 03/31/16 >> used on 26 of 30 nights with average 6 hrs 32 min.  Average AHI 5.5 with median CPAP 8 and 95 th percentile CPAP 11 cm H2O ONO with CPAP 04/10/16 >> test time 6 hrs 30 min.  Mean SpO2 93.51%, low SpO2 77%.  Spent 7 min with SpO2 < 88%   Past medical history Ankylosing spondylitis, Allergies, CAD, HLD, HTN, Nephrolithiasis, Malaria      Physical Exam 02/13/2021         *** 08/14/2020        176 07/29/2019    175 09/21/2018      182  02/18/2018       186    07/22/16 194 lb (88 kg)  07/15/16 196 lb (88.9 kg)  07/12/16 192 lb 9.6 oz (87.4 kg)    Vital signs reviewed  02/13/2021  - Note at rest 02 sats  ***% on ***   General appearance:    ***    minimal insp crackles bases  bilaterally***

## 2021-02-16 ENCOUNTER — Other Ambulatory Visit: Payer: Self-pay | Admitting: Family Medicine

## 2021-02-16 DIAGNOSIS — R39198 Other difficulties with micturition: Secondary | ICD-10-CM

## 2021-02-16 DIAGNOSIS — R300 Dysuria: Secondary | ICD-10-CM

## 2021-02-16 DIAGNOSIS — R351 Nocturia: Secondary | ICD-10-CM

## 2021-03-12 ENCOUNTER — Other Ambulatory Visit: Payer: Self-pay

## 2021-03-12 ENCOUNTER — Ambulatory Visit (INDEPENDENT_AMBULATORY_CARE_PROVIDER_SITE_OTHER): Payer: Medicare Other | Admitting: Family Medicine

## 2021-03-12 VITALS — BP 128/74 | HR 60 | Temp 98.1°F | Resp 16 | Ht 67.0 in | Wt 175.0 lb

## 2021-03-12 DIAGNOSIS — R300 Dysuria: Secondary | ICD-10-CM | POA: Diagnosis not present

## 2021-03-12 DIAGNOSIS — R195 Other fecal abnormalities: Secondary | ICD-10-CM | POA: Diagnosis not present

## 2021-03-12 DIAGNOSIS — C61 Malignant neoplasm of prostate: Secondary | ICD-10-CM

## 2021-03-12 DIAGNOSIS — I25119 Atherosclerotic heart disease of native coronary artery with unspecified angina pectoris: Secondary | ICD-10-CM | POA: Diagnosis not present

## 2021-03-12 DIAGNOSIS — R7303 Prediabetes: Secondary | ICD-10-CM | POA: Diagnosis not present

## 2021-03-12 DIAGNOSIS — R351 Nocturia: Secondary | ICD-10-CM

## 2021-03-12 DIAGNOSIS — R1031 Right lower quadrant pain: Secondary | ICD-10-CM | POA: Diagnosis not present

## 2021-03-12 DIAGNOSIS — E785 Hyperlipidemia, unspecified: Secondary | ICD-10-CM | POA: Diagnosis not present

## 2021-03-12 DIAGNOSIS — R39198 Other difficulties with micturition: Secondary | ICD-10-CM | POA: Diagnosis not present

## 2021-03-12 LAB — COMPREHENSIVE METABOLIC PANEL
ALT: 19 U/L (ref 0–53)
AST: 21 U/L (ref 0–37)
Albumin: 4.6 g/dL (ref 3.5–5.2)
Alkaline Phosphatase: 55 U/L (ref 39–117)
BUN: 22 mg/dL (ref 6–23)
CO2: 28 mEq/L (ref 19–32)
Calcium: 9.6 mg/dL (ref 8.4–10.5)
Chloride: 104 mEq/L (ref 96–112)
Creatinine, Ser: 0.86 mg/dL (ref 0.40–1.50)
GFR: 83.37 mL/min (ref >60.00)
Glucose, Bld: 111 mg/dL — ABNORMAL HIGH (ref 70–99)
Potassium: 4.6 mEq/L (ref 3.5–5.1)
Sodium: 140 mEq/L (ref 135–145)
Total Bilirubin: 0.7 mg/dL (ref 0.2–1.2)
Total Protein: 6.9 g/dL (ref 6.0–8.3)

## 2021-03-12 LAB — CBC
HCT: 41.9 % (ref 39.0–52.0)
Hemoglobin: 13.7 g/dL (ref 13.0–17.0)
MCHC: 32.6 g/dL (ref 30.0–36.0)
MCV: 91.6 fl (ref 78.0–100.0)
Platelets: 259 10*3/uL (ref 150.0–400.0)
RBC: 4.58 Mil/uL (ref 4.22–5.81)
RDW: 19.5 % — ABNORMAL HIGH (ref 11.5–15.5)
WBC: 6.9 10*3/uL (ref 4.0–10.5)

## 2021-03-12 LAB — LIPID PANEL
Cholesterol: 98 mg/dL (ref 0–200)
HDL: 37.2 mg/dL — ABNORMAL LOW (ref >39.00)
LDL Cholesterol: 49 mg/dL (ref 0–99)
NonHDL: 61.13
Total CHOL/HDL Ratio: 3
Triglycerides: 63 mg/dL (ref 0.0–149.0)
VLDL: 12.6 mg/dL (ref 0.0–40.0)

## 2021-03-12 LAB — HEMOGLOBIN A1C: Hgb A1c MFr Bld: 6.1 % (ref 4.6–6.5)

## 2021-03-12 MED ORDER — DOXAZOSIN MESYLATE 1 MG PO TABS: 1.0000 mg | ORAL_TABLET | Freq: Every day | ORAL | 1 refills | Status: DC

## 2021-03-12 NOTE — Progress Notes (Addendum)
Subjective:  Patient ID: [name], male    DOB: 1941/06/02  Age: 80 y.o. MRN: [id number]  CC:  Chief Complaint  Patient presents with   Prediabetes    Pt here for recheck today    Cancer    Pt reports PSA taken at urology and has been dxd with cancer of the prostate and was told to follow up with uro one year    Abdominal Pain    Pt reports lower abdominal pain for 2-3 weeks, pt denies chest pain, pt has not noted a pattern of when painful, pt reports flares a few days then gone for a few     HPI [name] presents for  Prediabetes: Occasional half-and-half sweet tea, takeout food, no fast food. Lab Results  Component Value Date   HGBA1C 6.1 03/12/2021   Wt Readings from Last 3 Encounters:  03/12/21 175 lb (79.4 kg)  09/22/20 179 lb (81.2 kg)  09/11/20 177 lb (80.3 kg)   Hyperlipidemia: Zocor 20 mg daily. Lab Results  Component Value Date   CHOL 98 03/12/2021   HDL 37.20 (L) 03/12/2021   LDLCALC 49 03/12/2021   TRIG 63.0 03/12/2021   CHOLHDL 3 03/12/2021   Lab Results  Component Value Date   ALT 19 03/12/2021   AST 21 03/12/2021   ALKPHOS 55 03/12/2021   BILITOT 0.7 03/12/2021      Prostate cancer Followed by Dr. [name] at [facility].  Prior history of lower urinary tract symptoms treated with finasterid  History of prostate nodule, biopsies in May.  Of 12 biopsies 2 had high grade prostatic intraepithelial neoplasia, 1 with small focus of prostatic adenocarcinoma. Gleason score 3+3+6.  Plan for monitoring in 1 year.  He has some concerns regarding this follow-up plan, advised I will review urology notes but also he was encouraged to reach out to neurologist regarding these questions.  Lower urinary tract symptoms stable with doxazosin, no nocturia, lightheadedness or new side effects.  Abdominal pain Intermittent pain lasting a few days at a time over the past few weeks.  Comes and goes, notices with his back pain at times or with movement.  No  known history of hernia.  Has not seen a bulge.  No fevers, chills, nausea, vomiting, hematuria or urinary symptoms changes.  Has had occasional constipation and did notice dark stool on occasion over the past few weeks but seem to be associated with food.  Denies blood or recent dark stools.    History Patient Active Problem List   Diagnosis Date Noted   GERD (gastroesophageal reflux disease) 12/15/2017   Unstable angina ([place]) 12/05/2017   Angina at rest [facility]) 12/05/2017   Atypical chest pain    Dyslipidemia    COPD not affecting current episode of care [facility])    Essential hypertension 05/29/2017   Abnormal nuclear cardiac imaging test 01/14/2017   Bronchiectasis without complication ([place]) [id number]   ILD (interstitial lung disease) ([place]) 11/27/2016   Dizziness 10/21/2016   Cough variant asthma  vs UACS 07/22/2016   Obstructive sleep apnea 12/01/2015   Acute bronchitis 11/30/2015   Swelling of lower extremity 10/16/2015   Benign mole 02/04/2014   Impaired glucose tolerance 10/26/2013   NAFLD (nonalcoholic fatty liver disease) 04/27/2013   Thrombocytopenia ([place]) 10/21/2012   Exertional angina ([place]) 09/03/2012   MALARIA 12/05/2009   COLONIC POLYPS 12/05/2009   HYPERCHOLESTEROLEMIA 12/05/2009   ANXIETY 12/05/2009   HEMORRHOIDS 12/05/2009   Allergic rhinitis 12/05/2009   GANGLION CYST,  WRIST, LEFT 12/05/2009   ROTATOR CUFF TEAR 12/05/2009   OTHER DISORDER OF MUSCLE LIGAMENT AND FASCIA 12/05/2009   ABNORMAL CHEST XRAY 12/05/2009   CAD (coronary artery disease) 04/05/2009   Past Medical History:  Diagnosis Date   Abnormal chest x-ray    Allergic rhinitis    Allergy    Ankylosing spondylitis (HCC)    Anxiety    Arthritis    Colonic polyp    Coronary atherosclerosis of native coronary artery    Cough    Dizziness 10/21/2016   Ganglion cyst of wrist    left   Heart attack ([place]) 2008   Hemorrhoids    History of malaria    Hyperlipidemia    with low HDL   Hypertension     Kidney stone    Other disorder of muscle, ligament, and fascia    Rotator cuff tear    Swelling of lower extremity 10/16/2015   Past Surgical History:  Procedure Laterality Date   bicep rupture     CORONARY ANGIOPLASTY WITH STENT PLACEMENT     HEMORRHOID SURGERY  10/10   in [place]   LEFT HEART CATH AND CORONARY ANGIOGRAPHY N/A 01/14/2017   Procedure: Left Heart Cath and Coronary Angiography;  Surgeon: [name], MD;  Location: [facility] [facility];  Service: Cardiovascular;  Laterality: N/A;   LEFT HEART CATHETERIZATION WITH CORONARY ANGIOGRAM N/A 09/03/2012   Procedure: LEFT HEART CATHETERIZATION WITH CORONARY ANGIOGRAM;  Surgeon: [name], MD;  Location: [facility] CATH LAB;  Service: Cardiovascular;  Laterality: N/A;   ROTATOR CUFF REPAIR     bilat by Dr. French Ana   Allergies  Allergen Reactions   Celebrex [Celecoxib]     Pt states precipitated his heart attack.   Tramadol Other (See Comments)    Makes patient jumpy   Prior to Admission medications   Medication Sig Start Date End Date Taking? Authorizing Provider  albuterol (VENTOLIN HFA) 108 (90 Base) MCG/ACT inhaler Inhale into the lungs every 6 (six) hours as needed for wheezing or shortness of breath.   Yes [provider]  amLODipine (NORVASC) 5 MG tablet Take 1 tablet (5 mg total) by mouth daily. Please make yearly appt with Dr. Burt Knack for May 2022 for future refills. Thank you 1st attempt 12/07/20  Yes Burt Knack, Legrand Como, MD  Apoaequorin (PREVAGEN PO) Take 1 tablet by mouth daily.    Yes [provider]  aspirin 81 MG EC tablet Take 81 mg by mouth daily.   Yes [provider]  chlorpheniramine (CHLOR-TRIMETON) 4 MG tablet 1 -2 every 4 hours as needed 08/14/20  Yes [name], MD  doxazosin (CARDURA) 1 MG tablet TAKE 1 TABLET EVERY DAY 02/16/21  Yes [name], MD  famotidine (PEPCID) 20 MG tablet Take 20 mg by mouth at bedtime.   Yes [provider]  Multiple Vitamins-Minerals (ICAPS  AREDS 2) CAPS Take by mouth daily.   Yes [provider]  pantoprazole (PROTONIX) 40 MG tablet TAKE 1 TABLET 1 TIME DAILY 30 TO 60 MINUTES BEFORE FIRST MEAL OF THE DAY 12/06/20  Yes [name], MD  simvastatin (ZOCOR) 20 MG tablet TAKE 1 TABLET EVERY DAY WITH BREAKFAST (APPOINTMENT IS NEEDED FOR REFILLS) 01/26/21  Yes [name], MD  UNABLE TO FIND Med Name: CPAP per Dr Halford Chessman   Yes [provider]   Social History   Socioeconomic History   Marital status: Married    Spouse name: [name]   Number of children: 2  Years of education: Not on file   Highest education level: Not on file  Occupational History   Not on file  Tobacco Use   Smoking status: Never   Smokeless tobacco: Never  Vaping Use   Vaping Use: Never used  Substance and Sexual Activity   Alcohol use: Yes    Comment: holiday Rare    Drug use: No   Sexual activity: Not Currently  Other Topics Concern   Not on file  Social History Narrative   Not on file   Social Determinants of Health   Financial Resource Strain: Not on file  Food Insecurity: Not on file  Transportation Needs: Not on file  Physical Activity: Not on file  Stress: Not on file  Social Connections: Not on file  Intimate Partner Violence: Not on file    Review of Systems Per HPI.   Objective:   Vitals:   03/12/21 0801  BP: 128/74  Pulse: 60  Resp: 16  Temp: 98.1 F (36.7 C)  TempSrc: Temporal  SpO2: 95%  Weight: 175 lb (79.4 kg)  Height: '5\' 7"'$  (1.702 m)     Physical Exam Vitals reviewed.  Constitutional:      Appearance: He is well-developed.  HENT:     Head: Normocephalic and atraumatic.  Neck:     Vascular: No carotid bruit or JVD.  Cardiovascular:     Rate and Rhythm: Normal rate and regular rhythm.     Heart sounds: Normal heart sounds. No murmur heard. Pulmonary:     Effort: Pulmonary effort is normal.     Breath sounds: Normal breath sounds. No rales.  Abdominal:     Hernia: A hernia  (Suspected right inguinal hernia, easily reducible.  In similar area where he had discomfort with lying backwards, but nontender on exam) is present.  Musculoskeletal:     Right lower leg: No edema.     Left lower leg: No edema.  Skin:    General: Skin is warm and dry.  Neurological:     Mental Status: He is alert and oriented to person, place, and time.  Psychiatric:        Mood and Affect: Mood normal.   Results for orders placed or performed in visit on 03/12/21  Hemoglobin A1c  Result Value Ref Range   Hgb A1c MFr Bld 6.1 4.6 - 6.5 %  CBC  Result Value Ref Range   WBC 6.9 4.0 - 10.5 K/uL   RBC 4.58 4.22 - 5.81 Mil/uL   Platelets 259.0 150.0 - 400.0 K/uL   Hemoglobin 13.7 13.0 - 17.0 g/dL   HCT 41.9 39.0 - 52.0 %   MCV 91.6 78.0 - 100.0 fl   MCHC 32.6 30.0 - 36.0 g/dL   RDW 19.5 (H) 11.5 - 15.5 %  Comprehensive metabolic panel  Result Value Ref Range   Sodium 140 135 - 145 mEq/L   Potassium 4.6 3.5 - 5.1 mEq/L   Chloride 104 96 - 112 mEq/L   CO2 28 19 - 32 mEq/L   Glucose, Bld 111 (H) 70 - 99 mg/dL   BUN 22 6 - 23 mg/dL   Creatinine, Ser 0.86 0.40 - 1.50 mg/dL   Total Bilirubin 0.7 0.2 - 1.2 mg/dL   Alkaline Phosphatase 55 39 - 117 U/L   AST 21 0 - 37 U/L   ALT 19 0 - 53 U/L   Total Protein 6.9 6.0 - 8.3 g/dL   Albumin 4.6 3.5 - 5.2 g/dL   GFR  83.37 >60.00 mL/min   Calcium 9.6 8.4 - 10.5 mg/dL  Lipid panel  Result Value Ref Range   Cholesterol 98 0 - 200 mg/dL   Triglycerides 63.0 0.0 - 149.0 mg/dL   HDL 37.20 (L) >39.00 mg/dL   VLDL 12.6 0.0 - 40.0 mg/dL   LDL Cholesterol 49 0 - 99 mg/dL   Total CHOL/HDL Ratio 3    NonHDL 61.13   IFOBT POC (occult bld, rslt in office)  Result Value Ref Range   IFOBT Negative      Assessment & Plan:  [name] is a 80 y.o. male . RLQ abdominal pain - Plan: CBC, IFOBT POC (occult bld, rslt in office), CT Abdomen Pelvis Wo Contrast  - suspected R inguinal hernia. Minimal symptoms and easily reducible. CBC reassuring.  Check CT with ER precautions given.   Abnormality of urination - Plan: doxazosin (CARDURA) 1 MG tablet Nocturia - Plan: doxazosin (CARDURA) 1 MG tablet Dysuria - Plan: doxazosin (CARDURA) 1 MG tablet  - improved - continue doxazosin.   Dark stools - Plan: IFOBT POC (occult bld, rslt in office)  - cbc ok. Rtc precautions if recurs.   Prediabetes - Plan: Hemoglobin A1c  - repeat labs above. Watch diet/exercise.   Hyperlipidemia, unspecified hyperlipidemia type - Plan: Comprehensive metabolic panel, Lipid panel  - labs above. Stable. Continue same regimen.   Prostate cancer ([place])  - 1 biopsy with adenocarcinoma as above. Appears to be on active surveillance, but asked that he review any specific treatment plans with his urologist - call or follow up appt may be needed.   Meds ordered this encounter  Medications   doxazosin (CARDURA) 1 MG tablet    Sig: Take 1 tablet (1 mg total) by mouth daily.    Dispense:  90 tablet    Refill:  1   Patient Instructions  I will look at the note from urology, but I do recommend you contacting them for follow-up appointment if needed to review further questions.  Continue doxazosin same dose.  I will check prediabetes test.  Continue to try to avoid sweet tea, sugar containing beverages much as possible.  Depending on the level may need to adjust amount of takeout/restaurant food.  I will also check cholesterol test, no medication changes today.  I suspect there may be a hernia on your right lower abdomen, which may be causing some of the discomfort.  I will check a CT scan.  If any acute worsening pain, swelling in that area that does not resolve or new bowel changes, be seen in the emergency room.  If you have further dark stools or blood in the stools, be seen right away as well.  Recheck 1 week.    Signed,   Merri Ray, MD [place] 03/13/21 8:55 AM

## 2021-03-12 NOTE — Patient Instructions (Signed)
I will look at the note from urology, but I do recommend you contacting them for follow-up appointment if needed to review further questions.  Continue doxazosin same dose.  I will check prediabetes test.  Continue to try to avoid sweet tea, sugar containing beverages much as possible.  Depending on the level may need to adjust amount of takeout/restaurant food.  I will also check cholesterol test, no medication changes today.  I suspect there may be a hernia on your right lower abdomen, which may be causing some of the discomfort.  I will check a CT scan.  If any acute worsening pain, swelling in that area that does not resolve or new bowel changes, be seen in the emergency room.  If you have further dark stools or blood in the stools, be seen right away as well.  Recheck 1 week.

## 2021-03-13 ENCOUNTER — Ambulatory Visit (INDEPENDENT_AMBULATORY_CARE_PROVIDER_SITE_OTHER): Payer: Medicare Other | Admitting: Cardiovascular Disease

## 2021-03-13 ENCOUNTER — Other Ambulatory Visit: Payer: Self-pay | Admitting: Cardiovascular Disease

## 2021-03-13 ENCOUNTER — Encounter: Payer: Self-pay | Admitting: Cardiovascular Disease

## 2021-03-13 VITALS — BP 110/56 | HR 67 | Ht 67.0 in | Wt 172.8 lb

## 2021-03-13 DIAGNOSIS — I25119 Atherosclerotic heart disease of native coronary artery with unspecified angina pectoris: Secondary | ICD-10-CM

## 2021-03-13 DIAGNOSIS — I10 Essential (primary) hypertension: Secondary | ICD-10-CM

## 2021-03-13 DIAGNOSIS — I2510 Atherosclerotic heart disease of native coronary artery without angina pectoris: Secondary | ICD-10-CM | POA: Diagnosis not present

## 2021-03-13 DIAGNOSIS — E782 Mixed hyperlipidemia: Secondary | ICD-10-CM

## 2021-03-13 LAB — IFOBT (OCCULT BLOOD): IFOBT: NEGATIVE

## 2021-03-13 MED ORDER — AMLODIPINE BESYLATE 5 MG PO TABS: 5.0000 mg | ORAL_TABLET | Freq: Every day | ORAL | 3 refills | Status: DC

## 2021-03-13 MED ORDER — SIMVASTATIN 20 MG PO TABS: ORAL_TABLET | ORAL | 3 refills | Status: DC

## 2021-03-13 NOTE — Addendum Note (Signed)
Addended by: Patrcia Dolly on: 03/13/2021 09:41 AM   Modules accepted: Orders

## 2021-03-13 NOTE — Progress Notes (Signed)
Cardiology Office Note:    Date:  03/13/2021   ID:  CONNERY NICKELL, DOB [birth year], MRN [id number]  PCP:  [name], MD   [facility] Providers Cardiologist:  [name], MD     Referring MD: [name], MD   Chief Complaint  Patient presents with   Coronary Artery Disease    History of Present Illness:    [name] is a 80 y.o. male with a hx of hypertension, mixed hyperlipidemia, and coronary artery disease. The patient presented in 2008 with anterior MI and ventricular fibrillation cardiac arrest treated with stenting of the LAD.  Most recent heart catheterization in 2018 demonstrated continued patency of the LAD stent site with no other obstructive coronary disease.  He is here alone today. Since I've seen him last in 2021, he has recently been diagnosed with prostate cancer. Plans noted to follow-up in one year with no plans for treatment at this time. The patient complains of shortness of breath with activity.  This is been a longstanding issue and unchanged over recent months.  He has rare chest discomfort. He points to a focal area in the left chest where he has a 'minor pain' at times. No edema, orthopnea, or PND.   Past Medical History:  Diagnosis Date   Abnormal chest x-ray    Allergic rhinitis    Allergy    Ankylosing spondylitis (HCC)    Anxiety    Arthritis    Colonic polyp    Coronary atherosclerosis of native coronary artery    Cough    Dizziness 10/21/2016   Ganglion cyst of wrist    left   Heart attack ([place]) 2008   Hemorrhoids    History of malaria    Hyperlipidemia    with low HDL   Hypertension    Kidney stone    Other disorder of muscle, ligament, and fascia    Rotator cuff tear    Swelling of lower extremity 10/16/2015    Past Surgical History:  Procedure Laterality Date   bicep rupture     CORONARY ANGIOPLASTY WITH STENT PLACEMENT     HEMORRHOID SURGERY  10/10   in [place]   LEFT HEART CATH AND CORONARY ANGIOGRAPHY N/A  01/14/2017   Procedure: Left Heart Cath and Coronary Angiography;  Surgeon: [name], MD;  Location: [place] [facility];  Service: Cardiovascular;  Laterality: N/A;   LEFT HEART CATHETERIZATION WITH CORONARY ANGIOGRAM N/A 09/03/2012   Procedure: LEFT HEART CATHETERIZATION WITH CORONARY ANGIOGRAM;  Surgeon: [name], MD;  Location: [facility] CATH LAB;  Service: Cardiovascular;  Laterality: N/A;   ROTATOR CUFF REPAIR     bilat by Dr. French Ana    Current Medications: Current Meds  Medication Sig   albuterol (VENTOLIN HFA) 108 (90 Base) MCG/ACT inhaler Inhale into the lungs every 6 (six) hours as needed for wheezing or shortness of breath.   Apoaequorin (PREVAGEN PO) Take 1 tablet by mouth daily.    aspirin 81 MG EC tablet Take 81 mg by mouth daily.   chlorpheniramine (CHLOR-TRIMETON) 4 MG tablet 1 -2 every 4 hours as needed   doxazosin (CARDURA) 1 MG tablet Take 1 tablet (1 mg total) by mouth daily.   famotidine (PEPCID) 20 MG tablet Take 20 mg by mouth at bedtime.   Multiple Vitamins-Minerals (ICAPS AREDS 2) CAPS Take by mouth daily.   pantoprazole (PROTONIX) 40 MG tablet TAKE 1 TABLET 1 TIME DAILY 30 TO 60 MINUTES BEFORE FIRST MEAL OF THE  DAY   UNABLE TO FIND Med Name: CPAP per Dr Halford Chessman   [DISCONTINUED] amLODipine (NORVASC) 5 MG tablet TAKE 1 TABLET EVERY DAY   [DISCONTINUED] simvastatin (ZOCOR) 20 MG tablet TAKE 1 TABLET EVERY DAY WITH BREAKFAST (APPOINTMENT IS NEEDED FOR REFILLS)     Allergies:   Celebrex [celecoxib] and Tramadol   Social History   Socioeconomic History   Marital status: Married    Spouse name: [name]   Number of children: 2   Years of education: Not on file   Highest education level: Not on file  Occupational History   Not on file  Tobacco Use   Smoking status: Never   Smokeless tobacco: Never  Vaping Use   Vaping Use: Never used  Substance and Sexual Activity   Alcohol use: Yes    Comment: holiday Rare    Drug use: No   Sexual activity: Not Currently   Other Topics Concern   Not on file  Social History Narrative   Not on file   Social Determinants of Health   Financial Resource Strain: Not on file  Food Insecurity: Not on file  Transportation Needs: Not on file  Physical Activity: Not on file  Stress: Not on file  Social Connections: Not on file     Family History: The patient's family history includes Alcohol abuse in his father; Cancer in his mother; Diabetes in his mother; Emphysema in his mother; Rheum arthritis in his mother.  ROS:   Please see the history of present illness.    All other systems reviewed and are negative.  EKGs/Labs/Other Studies Reviewed:    The following studies were reviewed today: Cardiac Catheterization 01/14/2017: Conclusion  1. Continued patency of the LAD stented segment with no significant restenosis 2. Severe stenosis of a small diagonal branch unchanged from the previous study in 2014 3. Mild nonobstructive disease of the right coronary artery and left circumflex 4. Normal LVEDP with known normal LV systolic function by noninvasive assessment   Recommendations: Continued medical therapy. I don't think the patient's diagonal stenosis is causing any symptoms and clearly wouldn't be related to his exertional dyspnea. It may however be the reason he has an abnormal stress ECG. Recommend ongoing medical therapy with initiation of an exercise program. The diagonal is small and not well suited for PCI.  Diagnostic Dominance: Right   EKG:  EKG is ordered today.  The ekg ordered today demonstrates NSR 67 bpm, within normal limits  Recent Labs: 03/12/2021: ALT 19; BUN 22; Creatinine, Ser 0.86; Hemoglobin 13.7; Platelets 259.0; Potassium 4.6; Sodium 140  Recent Lipid Panel    Component Value Date/Time   CHOL 98 03/12/2021 0853   CHOL 108 01/03/2020 0832   TRIG 63.0 03/12/2021 0853   HDL 37.20 (L) 03/12/2021 0853   HDL 40 01/03/2020 0832   CHOLHDL 3 03/12/2021 0853   VLDL 12.6 03/12/2021 0853    LDLCALC 49 03/12/2021 0853   LDLCALC 51 01/03/2020 0832     Risk Assessment/Calculations:           Physical Exam:    VS:  BP (!) 110/56   Pulse 67   Ht '5\' 7"'$  (1.702 m)   Wt 172 lb 12.8 oz (78.4 kg)   SpO2 97%   BMI 27.06 kg/m     Wt Readings from Last 3 Encounters:  03/13/21 172 lb 12.8 oz (78.4 kg)  03/12/21 175 lb (79.4 kg)  09/22/20 179 lb (81.2 kg)     GEN:  Well nourished,  well developed in no acute distress HEENT: Normal NECK: No JVD; No carotid bruits LYMPHATICS: No lymphadenopathy CARDIAC: RRR, no murmurs, rubs, gallops RESPIRATORY:  Clear to auscultation without rales, wheezing or rhonchi  ABDOMEN: Soft, non-tender, non-distended MUSCULOSKELETAL:  No edema; No deformity  SKIN: Warm and dry NEUROLOGIC:  Alert and oriented x 3 PSYCHIATRIC:  Normal affect   ASSESSMENT:    1. Coronary artery disease involving native coronary artery of native heart without angina pectoris   2. Mixed hyperlipidemia   3. Essential hypertension    PLAN:    In order of problems listed above:  Patient stable without symptoms of angina.  He will continue on aspirin for antiplatelet therapy, simvastatin, and amlodipine.  Stress test from 2021 reviewed and showed no ischemia.  Discussed natural history of CAD today.  Patient understands that he will be reevaluated if symptoms of angina arise, otherwise we would anticipate ongoing medical therapy. Lipids have been at goal on simvastatin, refills written. Blood pressure well controlled on amlodipine 5 mg daily.  No changes made today.  Medication Adjustments/Labs and Tests Ordered: Current medicines are reviewed at length with the patient today.  Concerns regarding medicines are outlined above.  Orders Placed This Encounter  Procedures   EKG 12-Lead   Meds ordered this encounter  Medications   amLODipine (NORVASC) 5 MG tablet    Sig: Take 1 tablet (5 mg total) by mouth daily.    Dispense:  90 tablet    Refill:  3    simvastatin (ZOCOR) 20 MG tablet    Sig: TAKE 1 TABLET EVERY DAY WITH BREAKFAST    Dispense:  90 tablet    Refill:  3    Patient Instructions  Medication Instructions:  No changes *If you need a refill on your cardiac medications before your next appointment, please call your pharmacy*   Lab Work: none  Testing/Procedures: none   Follow-Up: At [facility], you and your health needs are our priority.  As part of our continuing mission to provide you with exceptional heart care, we have created designated Provider Care Teams.  These Care Teams include your primary Cardiologist (physician) and Advanced Practice Providers (APPs -  Physician Assistants and Nurse Practitioners) who all work together to provide you with the care you need, when you need it.   Your next appointment:   12 month(s)  The format for your next appointment:   In Person  Provider:   You may see [name], MD or one of the following Advanced Practice Providers on your designated Care Team:   Richardson Dopp, PA-C Robbie Lis, [name]       Signed, [name], MD  03/13/2021 5:12 PM    [place]

## 2021-03-13 NOTE — Patient Instructions (Signed)
Medication Instructions:  No changes *If you need a refill on your cardiac medications before your next appointment, please call your pharmacy*   Lab Work: none  Testing/Procedures: none   Follow-Up: At [facility], you and your health needs are our priority.  As part of our continuing mission to provide you with exceptional heart care, we have created designated Provider Care Teams.  These Care Teams include your primary Cardiologist (physician) and Advanced Practice Providers (APPs -  Physician Assistants and Nurse Practitioners) who all work together to provide you with the care you need, when you need it.   Your next appointment:   12 month(s)  The format for your next appointment:   In Person  Provider:   You may see [name], MD or one of the following Advanced Practice Providers on your designated Care Team:   Richardson Dopp, PA-C [place], [name]

## 2021-03-24 ENCOUNTER — Encounter: Payer: Self-pay | Admitting: Internal Medicine

## 2021-03-26 ENCOUNTER — Ambulatory Visit: Payer: Medicare Other | Admitting: Family Medicine

## 2021-03-26 ENCOUNTER — Other Ambulatory Visit: Payer: Self-pay | Admitting: Medical Oncology

## 2021-03-26 DIAGNOSIS — D696 Thrombocytopenia, unspecified: Secondary | ICD-10-CM

## 2021-04-02 ENCOUNTER — Other Ambulatory Visit: Payer: Self-pay

## 2021-04-02 ENCOUNTER — Ambulatory Visit
Admission: RE | Admit: 2021-04-02 | Discharge: 2021-04-02 | Disposition: A | Discharge status: Home / Self Care | Payer: Medicare Other | Source: Ambulatory Visit | Attending: Family Medicine | Admitting: Family Medicine

## 2021-04-02 DIAGNOSIS — K449 Diaphragmatic hernia without obstruction or gangrene: Secondary | ICD-10-CM | POA: Diagnosis not present

## 2021-04-02 DIAGNOSIS — R1031 Right lower quadrant pain: Secondary | ICD-10-CM

## 2021-04-05 ENCOUNTER — Other Ambulatory Visit: Payer: Self-pay

## 2021-04-05 ENCOUNTER — Encounter: Payer: Self-pay | Admitting: Family Medicine

## 2021-04-05 ENCOUNTER — Ambulatory Visit (INDEPENDENT_AMBULATORY_CARE_PROVIDER_SITE_OTHER): Payer: Medicare Other | Admitting: Family Medicine

## 2021-04-05 VITALS — BP 128/78 | HR 62 | Temp 98.2°F | Resp 17 | Ht 67.0 in | Wt 172.8 lb

## 2021-04-05 DIAGNOSIS — C61 Malignant neoplasm of prostate: Secondary | ICD-10-CM

## 2021-04-05 DIAGNOSIS — I25119 Atherosclerotic heart disease of native coronary artery with unspecified angina pectoris: Secondary | ICD-10-CM

## 2021-04-05 DIAGNOSIS — R39198 Other difficulties with micturition: Secondary | ICD-10-CM

## 2021-04-05 DIAGNOSIS — R109 Unspecified abdominal pain: Secondary | ICD-10-CM

## 2021-04-05 DIAGNOSIS — R1031 Right lower quadrant pain: Secondary | ICD-10-CM

## 2021-04-05 NOTE — Progress Notes (Signed)
Subjective:  Patient ID: [name], male    DOB: 12/29/1940  Age: 80 y.o. MRN: [id number]  CC:  Chief Complaint  Patient presents with   Abdominal Pain    Pt here 1 week ago fro LRQ pain, pt reports somewhat better it has come and gone notes it is overall less severe.     HPI [name] presents for   Right lower quadrant abdominal pain Follow-up from August 1 visit.  Initial concern for possible hernia.  CT scan obtained.  CBC, CMP normal.   CT without sign of hernia, appendix appeared okay.  Prostatomegaly with some possible chronic outlet obstruction findings of bladder.  No acute findings.  Fibrotic/scarring of lower lungs, recommended pulmonary eval.  Carotid artery disease noted (followed by Dr. Burt Knack with known hx of CAD)  Some soreness if stretching stomach back only, otherwise feels well. No n/v/fever. Eating ok. Soreness is improved. BM daily - normal. No further dark stools. No recent crunches, situps. Active with maintaining property.   Hx of prostate cancer. Urologist - Dr. [name]. Weak urinary stream. Min change with doxazosin. Not waking up at much at night. Near resolution. No dysuria.   No recent dyspnea, no new cough. Followed by pulmonary - Dr. Melvyn Novas.   CT Abdomen Pelvis Wo Contrast  Result Date: 04/02/2021 CLINICAL DATA:  Right lower quadrant abdominal pain, suspected hernia, prostate cancer, history of stones EXAM: CT ABDOMEN AND PELVIS WITHOUT CONTRAST TECHNIQUE: Multidetector CT imaging of the abdomen and pelvis was performed following the standard protocol without IV contrast. COMPARISON:  05/19/2017 FINDINGS: Lower chest: No acute abnormality. Extensive 3 vessel coronary artery calcifications. Chest moderate fibrotic scarring of the included bilateral lung bases, which appears somewhat increased compared to prior examination 05/19/2017. Small hiatal hernia. Hepatobiliary: No solid liver abnormality is seen. No gallstones, gallbladder wall thickening, or  biliary dilatation. Pancreas: Unremarkable. No pancreatic ductal dilatation or surrounding inflammatory changes. Spleen: Normal in size without significant abnormality. Adrenals/Urinary Tract: Adrenal glands are unremarkable. Kidneys are normal, without renal calculi, solid lesion, or hydronephrosis. Thickening of the urinary bladder wall. Stomach/Bowel: Stomach is within normal limits. Appendix appears normal. No evidence of bowel wall thickening, distention, or inflammatory changes. Vascular/Lymphatic: Aortic atherosclerosis. No enlarged abdominal or pelvic lymph nodes. Reproductive: Mild prostatomegaly Other: No abdominal wall hernia or abnormality. No abdominopelvic ascites. Musculoskeletal: No acute or significant osseous findings. IMPRESSION: 1. No non-contrast CT findings of the abdomen or pelvis to explain right lower quadrant pain. No evidence of abdominal hernia. 2. Normal appendix. 3. No evidence of urinary tract calculus or hydronephrosis. 4. Mild prostatomegaly with thickening of the urinary bladder wall, likely related to chronic outlet obstruction. 5. Moderate fibrotic scarring of the included bilateral lung bases, which appears somewhat increased compared to prior examination 05/19/2017. Consider pulmonary referral and dedicated chest interstitial lung disease CT of the chest to further evaluate. 6. Coronary artery disease. Aortic Atherosclerosis (ICD10-I70.0). Electronically Signed   By: Eddie Candle M.D.   On: 04/02/2021 13:05      History Patient Active Problem List   Diagnosis Date Noted   GERD (gastroesophageal reflux disease) 12/15/2017   Unstable angina ([place]) 12/05/2017   Angina at rest [facility]) 12/05/2017   Atypical chest pain    Dyslipidemia    COPD not affecting current episode of care [facility])    Essential hypertension 05/29/2017   Abnormal nuclear cardiac imaging test 01/14/2017   Bronchiectasis without complication ([place]) [id number]   ILD (interstitial lung disease) ([place])  11/27/2016   Dizziness 10/21/2016   Cough variant asthma  vs UACS 07/22/2016   Obstructive sleep apnea 12/01/2015   Acute bronchitis 11/30/2015   Swelling of lower extremity 10/16/2015   Benign mole 02/04/2014   Impaired glucose tolerance 10/26/2013   NAFLD (nonalcoholic fatty liver disease) 04/27/2013   Thrombocytopenia ([place]) 10/21/2012   Exertional angina ([place]) 09/03/2012   MALARIA 12/05/2009   COLONIC POLYPS 12/05/2009   HYPERCHOLESTEROLEMIA 12/05/2009   ANXIETY 12/05/2009   HEMORRHOIDS 12/05/2009   Allergic rhinitis 12/05/2009   GANGLION CYST, WRIST, LEFT 12/05/2009   ROTATOR CUFF TEAR 12/05/2009   OTHER DISORDER OF MUSCLE LIGAMENT AND FASCIA 12/05/2009   ABNORMAL CHEST XRAY 12/05/2009   CAD (coronary artery disease) 04/05/2009   Past Medical History:  Diagnosis Date   Abnormal chest x-ray    Allergic rhinitis    Allergy    Ankylosing spondylitis ([place])    Anxiety    Arthritis    Colonic polyp    Coronary atherosclerosis of native coronary artery    Cough    Dizziness 10/21/2016   Ganglion cyst of wrist    left   Heart attack ([place]) 2008   Hemorrhoids    History of malaria    Hyperlipidemia    with low HDL   Hypertension    Kidney stone    Other disorder of muscle, ligament, and fascia    Rotator cuff tear    Swelling of lower extremity 10/16/2015   Past Surgical History:  Procedure Laterality Date   bicep rupture     CORONARY ANGIOPLASTY WITH STENT PLACEMENT     HEMORRHOID SURGERY  10/10   in [place]   LEFT HEART CATH AND CORONARY ANGIOGRAPHY N/A 01/14/2017   Procedure: Left Heart Cath and Coronary Angiography;  Surgeon: [name], MD;  Location: [place] [facility];  Service: Cardiovascular;  Laterality: N/A;   LEFT HEART CATHETERIZATION WITH CORONARY ANGIOGRAM N/A 09/03/2012   Procedure: LEFT HEART CATHETERIZATION WITH CORONARY ANGIOGRAM;  Surgeon: [name], MD;  Location: [facility] CATH LAB;  Service: Cardiovascular;  Laterality: N/A;   ROTATOR CUFF REPAIR      bilat by Dr. French Ana   Allergies  Allergen Reactions   Celebrex [Celecoxib]     Pt states precipitated his heart attack.   Tramadol Other (See Comments)    Makes patient jumpy   Prior to Admission medications   Medication Sig Start Date End Date Taking? Authorizing Provider  albuterol (VENTOLIN HFA) 108 (90 Base) MCG/ACT inhaler Inhale into the lungs every 6 (six) hours as needed for wheezing or shortness of breath.   Yes [provider]  amLODipine (NORVASC) 5 MG tablet Take 1 tablet (5 mg total) by mouth daily. 03/13/21  Yes [name], MD  Apoaequorin (PREVAGEN PO) Take 1 tablet by mouth daily.    Yes [provider]  aspirin 81 MG EC tablet Take 81 mg by mouth daily.   Yes [provider]  chlorpheniramine (CHLOR-TRIMETON) 4 MG tablet 1 -2 every 4 hours as needed 08/14/20  Yes [name], MD  doxazosin (CARDURA) 1 MG tablet Take 1 tablet (1 mg total) by mouth daily. 03/12/21  Yes [name], MD  famotidine (PEPCID) 20 MG tablet Take 20 mg by mouth at bedtime.   Yes [provider]  Multiple Vitamins-Minerals (ICAPS AREDS 2) CAPS Take by mouth daily.   Yes [provider]  pantoprazole (PROTONIX) 40 MG tablet TAKE 1 TABLET 1 TIME DAILY 30 TO 60 MINUTES BEFORE  FIRST MEAL OF THE DAY 12/06/20  Yes [name], MD  simvastatin (ZOCOR) 20 MG tablet TAKE 1 TABLET EVERY DAY WITH BREAKFAST 03/13/21  Yes [name], MD  UNABLE TO FIND Med Name: CPAP per Dr Halford Chessman   Yes [provider]   Social History   Socioeconomic History   Marital status: Married    Spouse name: [name]   Number of children: 2   Years of education: Not on file   Highest education level: Not on file  Occupational History   Not on file  Tobacco Use   Smoking status: Never   Smokeless tobacco: Never  Vaping Use   Vaping Use: Never used  Substance and Sexual Activity   Alcohol use: Yes    Comment: holiday Rare    Drug use: No   Sexual activity:  Not Currently  Other Topics Concern   Not on file  Social History Narrative   Not on file   Social Determinants of Health   Financial Resource Strain: Not on file  Food Insecurity: Not on file  Transportation Needs: Not on file  Physical Activity: Not on file  Stress: Not on file  Social Connections: Not on file  Intimate Partner Violence: Not on file    Review of Systems 13 point review of systems per patient health survey noted.  Negative other than as indicated above or in HPI.    Objective:   Vitals:   04/05/21 1109  BP: 128/78  Pulse: 62  Resp: 17  Temp: 98.2 F (36.8 C)  TempSrc: Temporal  SpO2: 95%  Weight: 172 lb 12.8 oz (78.4 kg)  Height: '5\' 7"'$  (1.702 m)     Physical Exam Vitals reviewed.  Constitutional:      Appearance: He is well-developed.  HENT:     Head: Normocephalic and atraumatic.  Neck:     Vascular: No carotid bruit or JVD.  Cardiovascular:     Rate and Rhythm: Normal rate and regular rhythm.     Heart sounds: Normal heart sounds. No murmur heard. Pulmonary:     Effort: Pulmonary effort is normal.     Breath sounds: No rales.     Comments: Breath sounds lower lobes bilaterally, normal effort, no distress.  No cough during visit. Abdominal:     General: There is no distension.     Tenderness: There is abdominal tenderness (Right lower quadrant but appears to be poor over the abdominal musculature.  He also had discomfort with sitting back or sitting up and use of abdominal muscles.  No rebound/guarding.).     Hernia: No hernia is present.  Musculoskeletal:     Right lower leg: No edema.     Left lower leg: No edema.  Skin:    General: Skin is warm and dry.  Neurological:     Mental Status: He is alert and oriented to person, place, and time.  Psychiatric:        Mood and Affect: Mood normal.       Assessment & Plan:  [name] is a 80 y.o. male . Abdominal wall pain RLQ abdominal pain  -Overall reassuring CT without  apparent source of abdominal symptoms.  No apparent hernia.  CBC, CMP reassuring.  Still some discomfort on exam, but with positional changes and location of pain appears to be abdominal wall/muscular.  Avoid straining, heavy lifting, and as improving we will continue to monitor for now.  If any worsening pain or not continuing to  improve, follow-up advised  Abnormality of urination Prostate cancer ([place])  -Signs of outflow obstruction as above on CT, has been evaluated by urology, some improvement with doxazosin but advised urology follow-up if persistent symptoms or not continuing to improve.  No orders of the defined types were placed in this encounter.  Patient Instructions  CT scan looked ok for abdomen. Soreness may be from muscles on abdomen.  Try to avoid heavy lifting, or straining of those muscles.  If not continuing to improve, or worsens - I am happy to refer you to a specialist - keep me posted.   Return to the clinic or go to the nearest emergency room if any of your symptoms worsen or new symptoms occur.   If continued difficulty with urination, please follow up with Dr. [name] to discuss different treatments.   Possible scarring seen on lower lungs. Discuss with Dr. Melvyn Novas at your next visit.            Signed,   Merri Ray, MD [place] 04/05/21 12:52 PM

## 2021-04-05 NOTE — Patient Instructions (Addendum)
CT scan looked ok for abdomen. Soreness may be from muscles on abdomen.  Try to avoid heavy lifting, or straining of those muscles.  If not continuing to improve, or worsens - I am happy to refer you to a specialist - keep me posted.   Return to the clinic or go to the nearest emergency room if any of your symptoms worsen or new symptoms occur.   If continued difficulty with urination, please follow up with Dr. [name] to discuss different treatments.   Possible scarring seen on lower lungs. Discuss with Dr. Melvyn Novas at your next visit.

## 2021-04-13 ENCOUNTER — Telehealth: Payer: Self-pay | Admitting: Family Medicine

## 2021-04-13 NOTE — Telephone Encounter (Signed)
Left message for patient to call back and schedule Medicare Annual Wellness Visit (AWV).   Please offer to do virtually or by telephone.  Left office number and my jabber #[phone].  Last AWV:02/25/2020  Please schedule at anytime with Nurse Health Advisor.   

## 2021-04-24 ENCOUNTER — Other Ambulatory Visit: Payer: Self-pay

## 2021-04-24 ENCOUNTER — Inpatient Hospital Stay: Payer: Medicare Other | Attending: Internal Medicine | Admitting: Internal Medicine

## 2021-04-24 ENCOUNTER — Inpatient Hospital Stay: Payer: Medicare Other

## 2021-04-24 VITALS — BP 125/59 | HR 66 | Temp 97.8°F | Resp 18 | Ht 67.0 in | Wt 171.7 lb

## 2021-04-24 DIAGNOSIS — Z79899 Other long term (current) drug therapy: Secondary | ICD-10-CM | POA: Diagnosis not present

## 2021-04-24 DIAGNOSIS — I2510 Atherosclerotic heart disease of native coronary artery without angina pectoris: Secondary | ICD-10-CM | POA: Diagnosis not present

## 2021-04-24 DIAGNOSIS — D696 Thrombocytopenia, unspecified: Secondary | ICD-10-CM

## 2021-04-24 DIAGNOSIS — Z7982 Long term (current) use of aspirin: Secondary | ICD-10-CM | POA: Insufficient documentation

## 2021-04-24 DIAGNOSIS — D693 Immune thrombocytopenic purpura: Secondary | ICD-10-CM | POA: Insufficient documentation

## 2021-04-24 DIAGNOSIS — I10 Essential (primary) hypertension: Secondary | ICD-10-CM | POA: Diagnosis not present

## 2021-04-24 DIAGNOSIS — C61 Malignant neoplasm of prostate: Secondary | ICD-10-CM | POA: Insufficient documentation

## 2021-04-24 LAB — CBC WITH DIFFERENTIAL (CANCER CENTER ONLY)
Abs Immature Granulocytes: 0.35 10*3/uL — ABNORMAL HIGH (ref 0.00–0.07)
Basophils Absolute: 0 10*3/uL (ref 0.0–0.1)
Basophils Relative: 0 %
Eosinophils Absolute: 0.1 10*3/uL (ref 0.0–0.5)
Eosinophils Relative: 1 %
HCT: 42.5 % (ref 39.0–52.0)
Hemoglobin: 13.8 g/dL (ref 13.0–17.0)
Immature Granulocytes: 5 %
Lymphocytes Relative: 13 %
Lymphs Abs: 1 10*3/uL (ref 0.7–4.0)
MCH: 29.8 pg (ref 26.0–34.0)
MCHC: 32.5 g/dL (ref 30.0–36.0)
MCV: 91.8 fL (ref 80.0–100.0)
Monocytes Absolute: 1.4 10*3/uL — ABNORMAL HIGH (ref 0.1–1.0)
Monocytes Relative: 18 %
Neutro Abs: 4.9 10*3/uL (ref 1.7–7.7)
Neutrophils Relative %: 63 %
Platelet Count: 323 10*3/uL (ref 150–400)
RBC: 4.63 MIL/uL (ref 4.22–5.81)
RDW: 18.5 % — ABNORMAL HIGH (ref 11.5–15.5)
WBC Count: 7.8 10*3/uL (ref 4.0–10.5)
nRBC: 0 % (ref 0.0–0.2)

## 2021-04-24 NOTE — Progress Notes (Signed)
[place] Telephone:([phone]) [phone]   [phone]  OFFICE PROGRESS NOTE  [name], MD [address] [place] [address]  DIAGNOSIS:  1) Thrombocytopenia, most likely idiopathic thrombocytopenic purpura. 2) prostate adenocarcinoma currently under the care of Dr. [name]  PRIOR THERAPY:None.  CURRENT THERAPY: observation.  INTERVAL HISTORY: [name] 80 y.o. male returns to the clinic today for annual follow-up visit.  The patient is feeling fine today with no concerning complaints.  He was diagnosed recently with prostate adenocarcinoma and followed by [facility].  He is currently on observation.  He denied having any current chest pain, shortness of breath, cough or hemoptysis.  He denied having any fever or chills.  He has no nausea, vomiting, diarrhea or constipation.  He is here today for evaluation with repeat CBC.  MEDICAL HISTORY: Past Medical History:  Diagnosis Date   Abnormal chest x-ray    Allergic rhinitis    Allergy    Ankylosing spondylitis ([facility])    Anxiety    Arthritis    Colonic polyp    Coronary atherosclerosis of native coronary artery    Cough    Dizziness 10/21/2016   Ganglion cyst of wrist    left   Heart attack ([place]) 2008   Hemorrhoids    History of malaria    Hyperlipidemia    with low HDL   Hypertension    Kidney stone    Other disorder of muscle, ligament, and fascia    Rotator cuff tear    Swelling of lower extremity 10/16/2015    ALLERGIES:  is allergic to celebrex [celecoxib] and tramadol.  MEDICATIONS:  Current Outpatient Medications  Medication Sig Dispense Refill   albuterol (VENTOLIN HFA) 108 (90 Base) MCG/ACT inhaler Inhale into the lungs every 6 (six) hours as needed for wheezing or shortness of breath.     amLODipine (NORVASC) 5 MG tablet Take 1 tablet (5 mg total) by mouth daily. 90 tablet 3   Apoaequorin (PREVAGEN PO) Take 1 tablet by mouth daily.      aspirin 81 MG EC tablet  Take 81 mg by mouth daily.     chlorpheniramine (CHLOR-TRIMETON) 4 MG tablet 1 -2 every 4 hours as needed     doxazosin (CARDURA) 1 MG tablet Take 1 tablet (1 mg total) by mouth daily. 90 tablet 1   famotidine (PEPCID) 20 MG tablet Take 20 mg by mouth at bedtime.     Multiple Vitamins-Minerals (ICAPS AREDS 2) CAPS Take by mouth daily.     pantoprazole (PROTONIX) 40 MG tablet TAKE 1 TABLET 1 TIME DAILY 30 TO 60 MINUTES BEFORE FIRST MEAL OF THE DAY 90 tablet 1   simvastatin (ZOCOR) 20 MG tablet TAKE 1 TABLET EVERY DAY WITH BREAKFAST 90 tablet 3   UNABLE TO FIND Med Name: CPAP per Dr Halford Chessman     No current facility-administered medications for this visit.    SURGICAL HISTORY:  Past Surgical History:  Procedure Laterality Date   bicep rupture     CORONARY ANGIOPLASTY WITH STENT PLACEMENT     HEMORRHOID SURGERY  10/10   in [place]   LEFT HEART CATH AND CORONARY ANGIOGRAPHY N/A 01/14/2017   Procedure: Left Heart Cath and Coronary Angiography;  Surgeon: [name], MD;  Location: [place] [facility];  Service: Cardiovascular;  Laterality: N/A;   LEFT HEART CATHETERIZATION WITH CORONARY ANGIOGRAM N/A 09/03/2012   Procedure: LEFT HEART CATHETERIZATION WITH CORONARY ANGIOGRAM;  Surgeon:  [name], MD;  Location: [facility] CATH LAB;  Service: Cardiovascular;  Laterality: N/A;   ROTATOR CUFF REPAIR     bilat by Dr. French Ana    REVIEW OF SYSTEMS:  A comprehensive review of systems was negative.   PHYSICAL EXAMINATION: General appearance: alert, cooperative, and no distress Head: Normocephalic, without obvious abnormality, atraumatic Neck: no adenopathy Lymph nodes: Cervical, supraclavicular, and axillary nodes normal. Resp: clear to auscultation bilaterally Back: symmetric, no curvature. ROM normal. No CVA tenderness. Cardio: regular rate and rhythm, S1, S2 normal, no murmur, click, rub or gallop GI: soft, non-tender; bowel sounds normal; no masses,  no organomegaly Extremities: extremities normal,  atraumatic, no cyanosis or edema  ECOG PERFORMANCE STATUS: 0 - Asymptomatic  Blood pressure (!) 125/59, pulse 66, temperature 97.8 F (36.6 C), temperature source Tympanic, resp. rate 18, height '5\' 7"'$  (1.702 m), weight 171 lb 11.2 oz (77.9 kg), SpO2 96 %.  LABORATORY DATA: Lab Results  Component Value Date   WBC 7.8 04/24/2021   HGB 13.8 04/24/2021   HCT 42.5 04/24/2021   MCV 91.8 04/24/2021   PLT 323 04/24/2021      Chemistry      Component Value Date/Time   NA 140 03/12/2021 0853   NA 141 01/03/2020 0832   NA 141 04/21/2017 0823   K 4.6 03/12/2021 0853   K 4.0 04/21/2017 0823   CL 104 03/12/2021 0853   CL 106 09/30/2012 0949   CO2 28 03/12/2021 0853   CO2 25 04/21/2017 0823   BUN 22 03/12/2021 0853   BUN 20 01/03/2020 0832   BUN 20.2 04/21/2017 0823   CREATININE 0.86 03/12/2021 0853   CREATININE 0.9 04/21/2017 0823      Component Value Date/Time   CALCIUM 9.6 03/12/2021 0853   CALCIUM 9.6 04/21/2017 0823   ALKPHOS 55 03/12/2021 0853   ALKPHOS 61 04/21/2017 0823   AST 21 03/12/2021 0853   AST 22 04/21/2017 0823   ALT 19 03/12/2021 0853   ALT 36 04/21/2017 0823   BILITOT 0.7 03/12/2021 0853   BILITOT 0.6 01/03/2020 0832   BILITOT 0.99 04/21/2017 0823     BONE MARROW REPORT FINAL DIAGNOSIS RADIOGRAPHIC STUDIES: No results found.  ASSESSMENT AND PLAN:  This is a very pleasant 80  years old white male with idiopathic thrombocytopenic purpura who is currently on observation.  The patient is doing fine today with no concerning complaints. Repeat CBC showed normal platelet count and no significant anemia or leukocytopenia. I recommended for him to continue on observation with repeat CBC, comprehensive metabolic panel and LDH in 1 year. Regarding the recently diagnosed prostate adenocarcinoma he is followed by [facility] and currently on observation. The patient was advised to call immediately if he has any other concerning symptoms in the interval. All  questions were answered. The patient knows to call the clinic with any problems, questions or concerns. We can certainly see the patient much sooner if necessary.  Disclaimer: This note was dictated with voice recognition software. Similar sounding words can inadvertently be transcribed and may not be corrected upon review.

## 2021-04-26 DIAGNOSIS — D225 Melanocytic nevi of trunk: Secondary | ICD-10-CM | POA: Diagnosis not present

## 2021-04-26 DIAGNOSIS — S70362A Insect bite (nonvenomous), left thigh, initial encounter: Secondary | ICD-10-CM | POA: Diagnosis not present

## 2021-04-26 DIAGNOSIS — L814 Other melanin hyperpigmentation: Secondary | ICD-10-CM | POA: Diagnosis not present

## 2021-04-26 DIAGNOSIS — Z08 Encounter for follow-up examination after completed treatment for malignant neoplasm: Secondary | ICD-10-CM | POA: Diagnosis not present

## 2021-04-26 DIAGNOSIS — L821 Other seborrheic keratosis: Secondary | ICD-10-CM | POA: Diagnosis not present

## 2021-04-26 DIAGNOSIS — L570 Actinic keratosis: Secondary | ICD-10-CM | POA: Diagnosis not present

## 2021-04-26 DIAGNOSIS — Z85828 Personal history of other malignant neoplasm of skin: Secondary | ICD-10-CM | POA: Diagnosis not present

## 2021-05-28 ENCOUNTER — Encounter: Payer: Self-pay | Admitting: Family Medicine

## 2021-05-28 DIAGNOSIS — Z23 Encounter for immunization: Secondary | ICD-10-CM | POA: Diagnosis not present

## 2021-05-29 ENCOUNTER — Encounter: Payer: Self-pay | Admitting: Family Medicine

## 2021-05-29 DIAGNOSIS — C61 Malignant neoplasm of prostate: Secondary | ICD-10-CM | POA: Insufficient documentation

## 2021-06-12 DIAGNOSIS — C61 Malignant neoplasm of prostate: Secondary | ICD-10-CM | POA: Diagnosis not present

## 2021-06-19 DIAGNOSIS — R3912 Poor urinary stream: Secondary | ICD-10-CM | POA: Diagnosis not present

## 2021-06-19 DIAGNOSIS — C61 Malignant neoplasm of prostate: Secondary | ICD-10-CM | POA: Diagnosis not present

## 2021-06-19 DIAGNOSIS — N401 Enlarged prostate with lower urinary tract symptoms: Secondary | ICD-10-CM | POA: Diagnosis not present

## 2021-06-19 DIAGNOSIS — N281 Cyst of kidney, acquired: Secondary | ICD-10-CM | POA: Diagnosis not present

## 2021-06-19 DIAGNOSIS — N5201 Erectile dysfunction due to arterial insufficiency: Secondary | ICD-10-CM | POA: Diagnosis not present

## 2021-07-19 ENCOUNTER — Other Ambulatory Visit: Payer: Self-pay | Admitting: Urology

## 2021-07-19 DIAGNOSIS — C61 Malignant neoplasm of prostate: Secondary | ICD-10-CM

## 2021-08-08 ENCOUNTER — Other Ambulatory Visit: Payer: Self-pay

## 2021-08-08 ENCOUNTER — Ambulatory Visit
Admission: RE | Admit: 2021-08-08 | Discharge: 2021-08-08 | Disposition: A | Discharge status: Home / Self Care | Payer: Medicare Other | Source: Ambulatory Visit | Attending: Urology | Admitting: Urology

## 2021-08-08 DIAGNOSIS — R972 Elevated prostate specific antigen [PSA]: Secondary | ICD-10-CM | POA: Diagnosis not present

## 2021-08-08 DIAGNOSIS — C61 Malignant neoplasm of prostate: Secondary | ICD-10-CM

## 2021-08-08 DIAGNOSIS — R590 Localized enlarged lymph nodes: Secondary | ICD-10-CM | POA: Diagnosis not present

## 2021-08-08 MED ORDER — GADOBENATE DIMEGLUMINE 529 MG/ML IV SOLN
15.0000 mL | Freq: Once | INTRAVENOUS | Status: AC | PRN
  Administered 2021-08-08: 11:00:00 15 mL via INTRAVENOUS

## 2021-09-07 NOTE — Progress Notes (Signed)
Office Visit Note  Patient: [name]             Date of Birth: 12/12/1940           MRN: [id number]             PCP: [name], MD Referring: [name], MD Visit Date: 09/21/2021 Occupation: @GUAROCC @  Subjective:  Pain in multiple joints  History of Present Illness: [name] is a 81 y.o. male with history of osteoarthritis and degenerative disc disease.  He is a Government social research officer.  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  He has been having increased pain and stiffness in his neck and has difficulty rotating his neck to the right side.  He has lower back pain.  He has some stiffness in his hands and his knee joints.  He has not noticed any joint swelling.  He denies any increased shortness of breath.  He has been seeing Dr. Melvyn Novas once a year now.  Activities of Daily Living:  Patient reports morning stiffness for 5-10 minutes.   Patient Denies nocturnal pain.  Difficulty dressing/grooming: Reports Difficulty climbing stairs: Denies Difficulty getting out of chair: Denies Difficulty using hands for taps, buttons, cutlery, and/or writing: Reports  Review of Systems  Constitutional:  Negative for fatigue.  HENT:  Positive for mouth dryness and nose dryness. Negative for mouth sores.   Eyes:  Negative for pain, itching and dryness.  Respiratory:  Negative for shortness of breath and difficulty breathing.   Cardiovascular:  Negative for chest pain and palpitations.  Gastrointestinal:  Negative for blood in stool, constipation and diarrhea.  Endocrine: Negative for increased urination.  Genitourinary:  Negative for difficulty urinating.  Musculoskeletal:  Positive for joint pain, joint pain and morning stiffness. Negative for joint swelling, myalgias, muscle tenderness and myalgias.  Skin:  Negative for color change, rash and redness.  Allergic/Immunologic: Negative for susceptible to infections.  Neurological:  Positive for parasthesias.  Negative for dizziness, numbness, headaches, memory loss and weakness.  Hematological:  Negative for bruising/bleeding tendency.  Psychiatric/Behavioral:  Negative for confusion.    PMFS History:  Patient Active Problem List   Diagnosis Date Noted   Prostate cancer ([place]) 05/29/2021   GERD (gastroesophageal reflux disease) 12/15/2017   Unstable angina ([place]) 12/05/2017   Angina at rest [facility]) 12/05/2017   Atypical chest pain    Dyslipidemia    COPD not affecting current episode of care [facility])    Essential hypertension 05/29/2017   Abnormal nuclear cardiac imaging test 01/14/2017   Bronchiectasis without complication ([place]) 08/65/7846   ILD (interstitial lung disease) ([place]) 11/27/2016   Dizziness 10/21/2016   Cough variant asthma  vs UACS 07/22/2016   Obstructive sleep apnea 12/01/2015   Acute bronchitis 11/30/2015   Swelling of lower extremity 10/16/2015   Benign mole 02/04/2014   Impaired glucose tolerance 10/26/2013   NAFLD (nonalcoholic fatty liver disease) 04/27/2013   Thrombocytopenia ([place]) 10/21/2012   Exertional angina ([place]) 09/03/2012   MALARIA 12/05/2009   COLONIC POLYPS 12/05/2009   HYPERCHOLESTEROLEMIA 12/05/2009   ANXIETY 12/05/2009   HEMORRHOIDS 12/05/2009   Allergic rhinitis 12/05/2009   GANGLION CYST, WRIST, LEFT 12/05/2009   ROTATOR CUFF TEAR 12/05/2009   OTHER DISORDER OF MUSCLE LIGAMENT AND FASCIA 12/05/2009   ABNORMAL CHEST XRAY 12/05/2009   CAD (coronary artery disease) 04/05/2009    Past Medical History:  Diagnosis Date   Abnormal chest x-ray    Allergic rhinitis    Allergy  Ankylosing spondylitis (HCC)    Anxiety    Arthritis    Colonic polyp    Coronary atherosclerosis of native coronary artery    Cough    Dizziness 10/21/2016   Ganglion cyst of wrist    left   Heart attack ([place]) 2008   Hemorrhoids    History of malaria    Hyperlipidemia    with low HDL   Hypertension    Kidney stone    Other disorder of muscle, ligament, and fascia     Rotator cuff tear    Swelling of lower extremity 10/16/2015    Family History  Problem Relation Age of Onset   Cancer Mother        melanoma   Emphysema Mother    Rheum arthritis Mother    Diabetes Mother    Alcohol abuse Father    Past Surgical History:  Procedure Laterality Date   bicep rupture     CORONARY ANGIOPLASTY WITH STENT PLACEMENT     HEMORRHOID SURGERY  05/2009   in [facility]   LEFT HEART CATH AND CORONARY ANGIOGRAPHY N/A 01/14/2017   Procedure: Left Heart Cath and Coronary Angiography;  Surgeon: [name], MD;  Location: [facility];  Service: Cardiovascular;  Laterality: N/A;   LEFT HEART CATHETERIZATION WITH CORONARY ANGIOGRAM N/A 09/03/2012   Procedure: LEFT HEART CATHETERIZATION WITH CORONARY ANGIOGRAM;  Surgeon: [name], MD;  Location: [facility] C[facility] LAB;  Service: Cardiovascular;  Laterality: N/A;   PROSTATE BIOPSY     ROTATOR CUFF REPAIR     bilat by Dr. French Ana   Social History   Social History Narrative   Not on file   Immunization History  Administered Date(s) Administered   Fluad Quad(high Dose 65+) 05/01/2019, 05/28/2021   Influenza Split 06/01/2012   Influenza Whole 05/30/2009, 04/20/2010   Influenza, High Dose Seasonal PF 05/19/2017, 05/19/2018   Influenza-Unspecified 05/13/2007, 07/12/2013, 05/10/2016, 06/23/2020   PFIZER(Purple Top)SARS-COV-2 Vaccination 09/18/2019, 10/09/2019, 07/13/2020   Pneumococcal Conjugate-13 05/19/2018   Pneumococcal Polysaccharide-23 08/22/2009   Tdap 12/04/2010, 06/04/2011, 07/31/2018   Zoster, Live 05/17/2008     Objective: Vital Signs: BP 128/67 (BP Location: Left Arm, Patient Position: Sitting, Cuff Size: Large)    Pulse (!) 50    Ht 5\' 7"  (1.702 m)    Wt 173 lb 6.4 oz (78.7 kg)    BMI 27.16 kg/m    Physical Exam Vitals and nursing note reviewed.  Constitutional:      Appearance: He is well-developed.  HENT:     Head: Normocephalic and atraumatic.  Eyes:     Conjunctiva/sclera: Conjunctivae normal.      Pupils: Pupils are equal, round, and reactive to light.  Cardiovascular:     Rate and Rhythm: Normal rate and regular rhythm.     Heart sounds: Normal heart sounds.  Pulmonary:     Effort: Pulmonary effort is normal.     Breath sounds: Normal breath sounds.  Abdominal:     General: Bowel sounds are normal.     Palpations: Abdomen is soft.  Musculoskeletal:     Cervical back: Normal range of motion and neck supple.  Skin:    General: Skin is warm and dry.     Capillary Refill: Capillary refill takes less than 2 seconds.  Neurological:     Mental Status: He is alert and oriented to person, place, and time.  Psychiatric:        Behavior: Behavior normal.     Musculoskeletal Exam: He had limited  range of motion of his cervical spine especially with the right lateral rotation.  Lumbar spine was in limited range of motion with some discomfort.  Shoulder joints, elbow joints, wrist joints with good range of motion.  He had bilateral PIP and DIP thickening with no synovitis.  Hip joints with good range of motion.  He had crepitus in his bilateral knee joints without any warmth swelling or effusion.  There was no tenderness over ankles or MTPs.  CDAI Exam: CDAI Score: -- Patient Global: --; Provider Global: -- Swollen: --; Tender: -- Joint Exam 09/21/2021   No joint exam has been documented for this visit   There is currently no information documented on the homunculus. Go to the Rheumatology activity and complete the homunculus joint exam.  Investigation: No additional findings.  Imaging: No results found.  Recent Labs: Lab Results  Component Value Date   WBC 7.8 04/24/2021   HGB 13.8 04/24/2021   PLT 323 04/24/2021   NA 140 03/12/2021   K 4.6 03/12/2021   CL 104 03/12/2021   CO2 28 03/12/2021   GLUCOSE 111 (H) 03/12/2021   BUN 22 03/12/2021   CREATININE 0.86 03/12/2021   BILITOT 0.7 03/12/2021   ALKPHOS 55 03/12/2021   AST 21 03/12/2021   ALT 19 03/12/2021   PROT  6.9 03/12/2021   ALBUMIN 4.6 03/12/2021   CALCIUM 9.6 03/12/2021   GFRAA 96 01/03/2020    Speciality Comments: No specialty comments available.  Procedures:  No procedures performed Allergies: Celebrex [celecoxib] and Tramadol   Assessment / Plan:     Visit Diagnoses: DDD (degenerative disc disease), cervical-he has been experiencing increased neck pain.  He had limited right lateral rotation.  Handout on neck exercises was given.  I also reviewed x-ray from 2016.  X-ray findings are consistent with disc disease and facet joint arthropathy.  He was referred to physical therapy.  Pain in thoracic spine - X-ray findings are consistent with degenerative disease of the thoracic spine.  He had no point tenderness over the thoracic region.  Chronic midline low back pain without sciatica - X-ray findings are consistent with degenerative disc disease.  He continues to have lower back pain and discomfort.  I will refer him to physical therapy.  Core strengthening exercises were discussed and a handout was placed in the AVS.  Weight loss was discussed.  Chronic SI joint pain-he has off-and-on discomfort.  He had no tenderness on palpation today.  Primary osteoarthritis of both hands - clinical findings are consistent with osteoarthritis.  Joint protection muscle strengthening were discussed.  Chronic pain of both knees - X-rays were consistent with mild osteoarthritis and chondromalacia patella.  Lower extremity muscle strengthening exercises were demonstrated in the office.  Weight loss will be beneficial.  Chronic pain of both ankles - The x-rays were unremarkable.  He had no warmth swelling or effusion on my examination.  Ankle exercises were demonstrated in the office.  Other medical problems are listed as follows:  ILD (interstitial lung disease) ([place]) - NSIP diagnosed by Dr. Melvyn Novas.  Bronchiectasis without complication ([place])  Essential hypertension-his blood pressure is normal  today.  Coronary artery disease involving native coronary artery of native heart with angina pectoris (HCC)  Thrombocytopenia ([place]) - followed by Dr. Earlie Server  Gastroesophageal reflux disease without esophagitis  Dyslipidemia  NAFLD (nonalcoholic fatty liver disease)-weight loss diet and exercise was discussed.  History of colonic polyps  Obstructive sleep apnea  History of anxiety  Orders: Orders Placed This  Encounter  Procedures   Ambulatory referral to Physical Therapy   No orders of the defined types were placed in this encounter.    Follow-Up Instructions: Return in about 6 months (around 03/21/2022) for Osteoarthritis.   Bo Merino, MD  Note - This record has been created using Editor, commissioning.  Chart creation errors have been sought, but may not always  have been located. Such creation errors do not reflect on  the standard of medical care.

## 2021-09-11 DIAGNOSIS — D075 Carcinoma in situ of prostate: Secondary | ICD-10-CM | POA: Diagnosis not present

## 2021-09-11 DIAGNOSIS — C61 Malignant neoplasm of prostate: Secondary | ICD-10-CM | POA: Diagnosis not present

## 2021-09-13 ENCOUNTER — Other Ambulatory Visit: Payer: Self-pay | Admitting: Family Medicine

## 2021-09-13 DIAGNOSIS — R300 Dysuria: Secondary | ICD-10-CM

## 2021-09-13 DIAGNOSIS — R39198 Other difficulties with micturition: Secondary | ICD-10-CM

## 2021-09-13 DIAGNOSIS — R351 Nocturia: Secondary | ICD-10-CM

## 2021-09-19 DIAGNOSIS — J449 Chronic obstructive pulmonary disease, unspecified: Secondary | ICD-10-CM | POA: Diagnosis not present

## 2021-09-19 DIAGNOSIS — C61 Malignant neoplasm of prostate: Secondary | ICD-10-CM | POA: Diagnosis not present

## 2021-09-19 DIAGNOSIS — N186 End stage renal disease: Secondary | ICD-10-CM | POA: Diagnosis not present

## 2021-09-19 DIAGNOSIS — R3912 Poor urinary stream: Secondary | ICD-10-CM | POA: Diagnosis not present

## 2021-09-19 DIAGNOSIS — N401 Enlarged prostate with lower urinary tract symptoms: Secondary | ICD-10-CM | POA: Diagnosis not present

## 2021-09-21 ENCOUNTER — Other Ambulatory Visit: Payer: Self-pay

## 2021-09-21 ENCOUNTER — Encounter: Payer: Self-pay | Admitting: Rheumatology

## 2021-09-21 ENCOUNTER — Ambulatory Visit: Payer: Medicare HMO | Admitting: Rheumatology

## 2021-09-21 VITALS — BP 128/67 | HR 50 | Ht 67.0 in | Wt 173.4 lb

## 2021-09-21 DIAGNOSIS — D696 Thrombocytopenia, unspecified: Secondary | ICD-10-CM

## 2021-09-21 DIAGNOSIS — E785 Hyperlipidemia, unspecified: Secondary | ICD-10-CM

## 2021-09-21 DIAGNOSIS — J479 Bronchiectasis, uncomplicated: Secondary | ICD-10-CM | POA: Diagnosis not present

## 2021-09-21 DIAGNOSIS — M25562 Pain in left knee: Secondary | ICD-10-CM

## 2021-09-21 DIAGNOSIS — M545 Low back pain, unspecified: Secondary | ICD-10-CM | POA: Diagnosis not present

## 2021-09-21 DIAGNOSIS — M25561 Pain in right knee: Secondary | ICD-10-CM

## 2021-09-21 DIAGNOSIS — Z860100 Personal history of colon polyps, unspecified: Secondary | ICD-10-CM

## 2021-09-21 DIAGNOSIS — M19041 Primary osteoarthritis, right hand: Secondary | ICD-10-CM | POA: Diagnosis not present

## 2021-09-21 DIAGNOSIS — J849 Interstitial pulmonary disease, unspecified: Secondary | ICD-10-CM

## 2021-09-21 DIAGNOSIS — M546 Pain in thoracic spine: Secondary | ICD-10-CM

## 2021-09-21 DIAGNOSIS — M19042 Primary osteoarthritis, left hand: Secondary | ICD-10-CM

## 2021-09-21 DIAGNOSIS — I25119 Atherosclerotic heart disease of native coronary artery with unspecified angina pectoris: Secondary | ICD-10-CM

## 2021-09-21 DIAGNOSIS — M67432 Ganglion, left wrist: Secondary | ICD-10-CM

## 2021-09-21 DIAGNOSIS — G4733 Obstructive sleep apnea (adult) (pediatric): Secondary | ICD-10-CM

## 2021-09-21 DIAGNOSIS — I10 Essential (primary) hypertension: Secondary | ICD-10-CM

## 2021-09-21 DIAGNOSIS — M25572 Pain in left ankle and joints of left foot: Secondary | ICD-10-CM

## 2021-09-21 DIAGNOSIS — M25571 Pain in right ankle and joints of right foot: Secondary | ICD-10-CM | POA: Diagnosis not present

## 2021-09-21 DIAGNOSIS — M533 Sacrococcygeal disorders, not elsewhere classified: Secondary | ICD-10-CM | POA: Diagnosis not present

## 2021-09-21 DIAGNOSIS — Z8613 Personal history of malaria: Secondary | ICD-10-CM

## 2021-09-21 DIAGNOSIS — M5030 Other cervical disc degeneration, unspecified cervical region: Secondary | ICD-10-CM | POA: Diagnosis not present

## 2021-09-21 DIAGNOSIS — Z8659 Personal history of other mental and behavioral disorders: Secondary | ICD-10-CM

## 2021-09-21 DIAGNOSIS — M7711 Lateral epicondylitis, right elbow: Secondary | ICD-10-CM

## 2021-09-21 DIAGNOSIS — K760 Fatty (change of) liver, not elsewhere classified: Secondary | ICD-10-CM

## 2021-09-21 DIAGNOSIS — G8929 Other chronic pain: Secondary | ICD-10-CM

## 2021-09-21 DIAGNOSIS — K219 Gastro-esophageal reflux disease without esophagitis: Secondary | ICD-10-CM

## 2021-09-21 NOTE — Patient Instructions (Signed)
Cervical Strain and Sprain Rehab Ask your health care provider which exercises are safe for you. Do exercises exactly as told by your health care provider and adjust them as directed. It is normal to feel mild stretching, pulling, tightness, or discomfort as you do these exercises. Stop right away if you feel sudden pain or your pain gets worse. Do not begin these exercises until told by your health care provider. Stretching and range-of-motion exercises Cervical side bending  Using good posture, sit on a stable chair or stand up. Without moving your shoulders, slowly tilt your left / right ear to your shoulder until you feel a stretch in the opposite side neck muscles. You should be looking straight ahead. Hold for __________ seconds. Repeat with the other side of your neck. Repeat __________ times. Complete this exercise __________ times a day. Cervical rotation  Using good posture, sit on a stable chair or stand up. Slowly turn your head to the side as if you are looking over your left / right shoulder. Keep your eyes level with the ground. Stop when you feel a stretch along the side and the back of your neck. Hold for __________ seconds. Repeat this by turning to your other side. Repeat __________ times. Complete this exercise __________ times a day. Thoracic extension and pectoral stretch Roll a towel or a small blanket so it is about 4 inches (10 cm) in diameter. Lie down on your back on a firm surface. Put the towel lengthwise, under your spine in the middle of your back. It should not be under your shoulder blades. The towel should line up with your spine from your middle back to your lower back. Put your hands behind your head and let your elbows fall out to your sides. Hold for __________ seconds. Repeat __________ times. Complete this exercise __________ times a day. Strengthening exercises Isometric upper cervical flexion Lie on your back with a thin pillow behind your head  and a small rolled-up towel under your neck. Gently tuck your chin toward your chest and nod your head down to look toward your feet. Do not lift your head off the pillow. Hold for __________ seconds. Release the tension slowly. Relax your neck muscles completely before you repeat this exercise. Repeat __________ times. Complete this exercise __________ times a day. Isometric cervical extension  Stand about 6 inches (15 cm) away from a wall, with your back facing the wall. Place a soft object, about 6-8 inches (15-20 cm) in diameter, between the back of your head and the wall. A soft object could be a small pillow, a ball, or a folded towel. Gently tilt your head back and press into the soft object. Keep your jaw and forehead relaxed. Hold for __________ seconds. Release the tension slowly. Relax your neck muscles completely before you repeat this exercise. Repeat __________ times. Complete this exercise __________ times a day. Posture and body mechanics Body mechanics refers to the movements and positions of your body while you do your daily activities. Posture is part of body mechanics. Good posture and healthy body mechanics can help to relieve stress in your body's tissues and joints. Good posture means that your spine is in its natural S-curve position (your spine is neutral), your shoulders are pulled back slightly, and your head is not tipped forward. The following are general guidelines for applying improved posture and body mechanics to your everyday activities. Sitting  When sitting, keep your spine neutral and keep your feet flat on the floor.  Use a footrest, if necessary, and keep your thighs parallel to the floor. Avoid rounding your shoulders, and avoid tilting your head forward. When working at a desk or a computer, keep your desk at a height where your hands are slightly lower than your elbows. Slide your chair under your desk so you are close enough to maintain good posture. When  working at a computer, place your monitor at a height where you are looking straight ahead and you do not have to tilt your head forward or downward to look at the screen. Standing  When standing, keep your spine neutral and keep your feet about hip-width apart. Keep a slight bend in your knees. Your ears, shoulders, and hips should line up. When you do a task in which you stand in one place for a long time, place one foot up on a stable object that is 2-4 inches (5-10 cm) high, such as a footstool. This helps keep your spine neutral. Resting When lying down and resting, avoid positions that are most painful for you. Try to support your neck in a neutral position. You can use a contour pillow or a small rolled-up towel. Your pillow should support your neck but not push on it. This information is not intended to replace advice given to you by your health care provider. Make sure you discuss any questions you have with your health care provider. Document Revised: 11/18/2018 Document Reviewed: 04/29/2018 Elsevier Patient Education  [place].  Back Exercises The following exercises strengthen the muscles that help to support the trunk (torso) and back. They also help to keep the lower back flexible. Doing these exercises can help to prevent or lessen existing low back pain. If you have back pain or discomfort, try doing these exercises 2-3 times each day or as told by your health care provider. As your pain improves, do them once each day, but increase the number of times that you repeat the steps for each exercise (do more repetitions). To prevent the recurrence of back pain, continue to do these exercises once each day or as told by your health care provider. Do exercises exactly as told by your health care provider and adjust them as directed. It is normal to feel mild stretching, pulling, tightness, or discomfort as you do these exercises, but you should stop right away if you feel sudden  pain or your pain gets worse. Exercises Single knee to chest Repeat these steps 3-5 times for each leg: Lie on your back on a firm bed or the floor with your legs extended. Bring one knee to your chest. Your other leg should stay extended and in contact with the floor. Hold your knee in place by grabbing your knee or thigh with both hands and hold. Pull on your knee until you feel a gentle stretch in your lower back or buttocks. Hold the stretch for 10-30 seconds. Slowly release and straighten your leg.  Pelvic tilt Repeat these steps 5-10 times: Lie on your back on a firm bed or the floor with your legs extended. Bend your knees so they are pointing toward the ceiling and your feet are flat on the floor. Tighten your lower abdominal muscles to press your lower back against the floor. This motion will tilt your pelvis so your tailbone points up toward the ceiling instead of pointing to your feet or the floor. With gentle tension and even breathing, hold this position for 5-10 seconds.  Cat-cow Repeat these steps until your  lower back becomes more flexible: Get into a hands-and-knees position on a firm bed or the floor. Keep your hands under your shoulders, and keep your knees under your hips. You may place padding under your knees for comfort. Let your head hang down toward your chest. Contract your abdominal muscles and point your tailbone toward the floor so your lower back becomes rounded like the back of a cat. Hold this position for 5 seconds. Slowly lift your head, let your abdominal muscles relax, and point your tailbone up toward the ceiling so your back forms a sagging arch like the back of a cow. Hold this position for 5 seconds.  Press-ups Repeat these steps 5-10 times: Lie on your abdomen (face-down) on a firm bed or the floor. Place your palms near your head, about shoulder-width apart. Keeping your back as relaxed as possible and keeping your hips on the floor, slowly  straighten your arms to raise the top half of your body and lift your shoulders. Do not use your back muscles to raise your upper torso. You may adjust the placement of your hands to make yourself more comfortable. Hold this position for 5 seconds while you keep your back relaxed. Slowly return to lying flat on the floor.  Bridges Repeat these steps 10 times: Lie on your back on a firm bed or the floor. Bend your knees so they are pointing toward the ceiling and your feet are flat on the floor. Your arms should be flat at your sides, next to your body. Tighten your buttocks muscles and lift your buttocks off the floor until your waist is at almost the same height as your knees. You should feel the muscles working in your buttocks and the back of your thighs. If you do not feel these muscles, slide your feet 1-2 inches (2.5-5 cm) farther away from your buttocks. Hold this position for 3-5 seconds. Slowly lower your hips to the starting position, and allow your buttocks muscles to relax completely. If this exercise is too easy, try doing it with your arms crossed over your chest. Abdominal crunches Repeat these steps 5-10 times: Lie on your back on a firm bed or the floor with your legs extended. Bend your knees so they are pointing toward the ceiling and your feet are flat on the floor. Cross your arms over your chest. Tip your chin slightly toward your chest without bending your neck. Tighten your abdominal muscles and slowly raise your torso high enough to lift your shoulder blades a tiny bit off the floor. Avoid raising your torso higher than that because it can put too much stress on your lower back and does not help to strengthen your abdominal muscles. Slowly return to your starting position.  Back lifts Repeat these steps 5-10 times: Lie on your abdomen (face-down) with your arms at your sides, and rest your forehead on the floor. Tighten the muscles in your legs and your  buttocks. Slowly lift your chest off the floor while you keep your hips pressed to the floor. Keep the back of your head in line with the curve in your back. Your eyes should be looking at the floor. Hold this position for 3-5 seconds. Slowly return to your starting position.  Contact a health care provider if: Your back pain or discomfort gets much worse when you do an exercise. Your worsening back pain or discomfort does not lessen within 2 hours after you exercise. If you have any of these problems, stop doing  these exercises right away. Do not do them again unless your health care provider says that you can. Get help right away if: You develop sudden, severe back pain. If this happens, stop doing the exercises right away. Do not do them again unless your health care provider says that you can. This information is not intended to replace advice given to you by your health care provider. Make sure you discuss any questions you have with your health care provider. Document Revised: 01/23/2021 Document Reviewed: 10/11/2020 Elsevier Patient Education  [place] Exercises Hand exercises can be helpful for almost anyone. These exercises can strengthen the hands, improve flexibility and movement, and increase blood flow to the hands. These results can make work and daily tasks easier. Hand exercises can be especially helpful for people who have joint pain from arthritis or have nerve damage from overuse (carpal tunnel syndrome). These exercises can also help people who have injured a hand. Exercises Most of these hand exercises are gentle stretching and motion exercises. It is usually safe to do them often throughout the day. Warming up your hands before exercise may help to reduce stiffness. You can do this with gentle massage or by placing your hands in warm water for 10-15 minutes. It is normal to feel some stretching, pulling, tightness, or mild discomfort as you begin new  exercises. This will gradually improve. Stop an exercise right away if you feel sudden, severe pain or your pain gets worse. Ask your health care provider which exercises are best for you. Knuckle bend or "claw" fist  Stand or sit with your arm, hand, and all five fingers pointed straight up. Make sure to keep your wrist straight during the exercise. Gently bend your fingers down toward your palm until the tips of your fingers are touching the top of your palm. Keep your big knuckle straight and just bend the small knuckles in your fingers. Hold this position for __________ seconds. Straighten (extend) your fingers back to the starting position. Repeat this exercise 5-10 times with each hand. Full finger fist  Stand or sit with your arm, hand, and all five fingers pointed straight up. Make sure to keep your wrist straight during the exercise. Gently bend your fingers into your palm until the tips of your fingers are touching the middle of your palm. Hold this position for __________ seconds. Extend your fingers back to the starting position, stretching every joint fully. Repeat this exercise 5-10 times with each hand. Straight fist Stand or sit with your arm, hand, and all five fingers pointed straight up. Make sure to keep your wrist straight during the exercise. Gently bend your fingers at the big knuckle, where your fingers meet your hand, and the middle knuckle. Keep the knuckle at the tips of your fingers straight and try to touch the bottom of your palm. Hold this position for __________ seconds. Extend your fingers back to the starting position, stretching every joint fully. Repeat this exercise 5-10 times with each hand. Tabletop  Stand or sit with your arm, hand, and all five fingers pointed straight up. Make sure to keep your wrist straight during the exercise. Gently bend your fingers at the big knuckle, where your fingers meet your hand, as far down as you can while keeping the  small knuckles in your fingers straight. Think of forming a tabletop with your fingers. Hold this position for __________ seconds. Extend your fingers back to the starting position, stretching every joint fully. Repeat this  exercise 5-10 times with each hand. Finger spread  Place your hand flat on a table with your palm facing down. Make sure your wrist stays straight as you do this exercise. Spread your fingers and thumb apart from each other as far as you can until you feel a gentle stretch. Hold this position for __________ seconds. Bring your fingers and thumb tight together again. Hold this position for __________ seconds. Repeat this exercise 5-10 times with each hand. Making circles  Stand or sit with your arm, hand, and all five fingers pointed straight up. Make sure to keep your wrist straight during the exercise. Make a circle by touching the tip of your thumb to the tip of your index finger. Hold for __________ seconds. Then open your hand wide. Repeat this motion with your thumb and each finger on your hand. Repeat this exercise 5-10 times with each hand. Thumb motion  Sit with your forearm resting on a table and your wrist straight. Your thumb should be facing up toward the ceiling. Keep your fingers relaxed as you move your thumb. Lift your thumb up as high as you can toward the ceiling. Hold for __________ seconds. Bend your thumb across your palm as far as you can, reaching the tip of your thumb for the small finger (pinkie) side of your palm. Hold for __________ seconds. Repeat this exercise 5-10 times with each hand. Grip strengthening  Hold a stress ball or other soft ball in the middle of your hand. Slowly increase the pressure, squeezing the ball as much as you can without causing pain. Think of bringing the tips of your fingers into the middle of your palm. All of your finger joints should bend when doing this exercise. Hold your squeeze for __________ seconds, then  relax. Repeat this exercise 5-10 times with each hand. Contact a health care provider if: Your hand pain or discomfort gets much worse when you do an exercise. Your hand pain or discomfort does not improve within 2 hours after you exercise. If you have any of these problems, stop doing these exercises right away. Do not do them again unless your health care provider says that you can. Get help right away if: You develop sudden, severe hand pain or swelling. If this happens, stop doing these exercises right away. Do not do them again unless your health care provider says that you can. This information is not intended to replace advice given to you by your health care provider. Make sure you discuss any questions you have with your health care provider. Document Revised: 11/16/2020 Document Reviewed: 11/16/2020 Elsevier Patient Education  [place].

## 2021-10-15 ENCOUNTER — Telehealth: Payer: Self-pay | Admitting: Rheumatology

## 2021-10-15 DIAGNOSIS — M545 Low back pain, unspecified: Secondary | ICD-10-CM

## 2021-10-15 DIAGNOSIS — M5030 Other cervical disc degeneration, unspecified cervical region: Secondary | ICD-10-CM

## 2021-10-15 DIAGNOSIS — M546 Pain in thoracic spine: Secondary | ICD-10-CM

## 2021-10-15 DIAGNOSIS — G8929 Other chronic pain: Secondary | ICD-10-CM

## 2021-10-15 NOTE — Telephone Encounter (Signed)
A referral to PT was placed on 09/21/21 but the patient refused to schedule an appointment at that time.  ? ?Ok to try placing a new referral for the same concerns to be addressed.

## 2021-10-15 NOTE — Telephone Encounter (Signed)
Referral placed.

## 2021-10-15 NOTE — Telephone Encounter (Signed)
Patient called the office requesting a referral be sent to [facility]. ?Fax [phone] ?

## 2021-10-18 ENCOUNTER — Telehealth: Payer: Self-pay

## 2021-10-18 NOTE — Telephone Encounter (Signed)
[name] from [facility] called stating she received a call from [facility] to schedule an appointment.  She states that [name] asked her to contact our office to have the referral faxed to their practice.  Please fax to [phone] Attn:  [name] ?

## 2021-10-19 NOTE — Telephone Encounter (Signed)
Referral, office note and demographics have been faxed to [facility].  ?

## 2021-10-24 DIAGNOSIS — L821 Other seborrheic keratosis: Secondary | ICD-10-CM | POA: Diagnosis not present

## 2021-10-24 DIAGNOSIS — D225 Melanocytic nevi of trunk: Secondary | ICD-10-CM | POA: Diagnosis not present

## 2021-10-24 DIAGNOSIS — Z85828 Personal history of other malignant neoplasm of skin: Secondary | ICD-10-CM | POA: Diagnosis not present

## 2021-10-24 DIAGNOSIS — L570 Actinic keratosis: Secondary | ICD-10-CM | POA: Diagnosis not present

## 2021-10-24 DIAGNOSIS — R202 Paresthesia of skin: Secondary | ICD-10-CM | POA: Diagnosis not present

## 2021-10-24 DIAGNOSIS — Z08 Encounter for follow-up examination after completed treatment for malignant neoplasm: Secondary | ICD-10-CM | POA: Diagnosis not present

## 2021-10-24 DIAGNOSIS — B351 Tinea unguium: Secondary | ICD-10-CM | POA: Diagnosis not present

## 2021-10-24 DIAGNOSIS — L814 Other melanin hyperpigmentation: Secondary | ICD-10-CM | POA: Diagnosis not present

## 2021-10-24 DIAGNOSIS — L609 Nail disorder, unspecified: Secondary | ICD-10-CM | POA: Diagnosis not present

## 2021-10-26 DIAGNOSIS — M5030 Other cervical disc degeneration, unspecified cervical region: Secondary | ICD-10-CM | POA: Diagnosis not present

## 2021-10-26 DIAGNOSIS — M545 Low back pain, unspecified: Secondary | ICD-10-CM | POA: Diagnosis not present

## 2021-10-26 DIAGNOSIS — M546 Pain in thoracic spine: Secondary | ICD-10-CM | POA: Diagnosis not present

## 2021-10-29 DIAGNOSIS — M545 Low back pain, unspecified: Secondary | ICD-10-CM | POA: Diagnosis not present

## 2021-10-29 DIAGNOSIS — M5030 Other cervical disc degeneration, unspecified cervical region: Secondary | ICD-10-CM | POA: Diagnosis not present

## 2021-10-29 DIAGNOSIS — M546 Pain in thoracic spine: Secondary | ICD-10-CM | POA: Diagnosis not present

## 2021-10-30 DIAGNOSIS — B351 Tinea unguium: Secondary | ICD-10-CM | POA: Diagnosis not present

## 2021-11-02 DIAGNOSIS — M545 Low back pain, unspecified: Secondary | ICD-10-CM | POA: Diagnosis not present

## 2021-11-02 DIAGNOSIS — M546 Pain in thoracic spine: Secondary | ICD-10-CM | POA: Diagnosis not present

## 2021-11-02 DIAGNOSIS — M5030 Other cervical disc degeneration, unspecified cervical region: Secondary | ICD-10-CM | POA: Diagnosis not present

## 2021-11-05 DIAGNOSIS — M545 Low back pain, unspecified: Secondary | ICD-10-CM | POA: Diagnosis not present

## 2021-11-05 DIAGNOSIS — M546 Pain in thoracic spine: Secondary | ICD-10-CM | POA: Diagnosis not present

## 2021-11-05 DIAGNOSIS — M5030 Other cervical disc degeneration, unspecified cervical region: Secondary | ICD-10-CM | POA: Diagnosis not present

## 2021-11-12 DIAGNOSIS — M546 Pain in thoracic spine: Secondary | ICD-10-CM | POA: Diagnosis not present

## 2021-11-12 DIAGNOSIS — M545 Low back pain, unspecified: Secondary | ICD-10-CM | POA: Diagnosis not present

## 2021-11-12 DIAGNOSIS — M5030 Other cervical disc degeneration, unspecified cervical region: Secondary | ICD-10-CM | POA: Diagnosis not present

## 2021-11-23 ENCOUNTER — Other Ambulatory Visit: Payer: Self-pay | Admitting: Internal Medicine

## 2021-12-02 ENCOUNTER — Other Ambulatory Visit: Payer: Self-pay

## 2021-12-02 ENCOUNTER — Ambulatory Visit
Admission: EM | Admit: 2021-12-02 | Discharge: 2021-12-02 | Disposition: A | Discharge status: Home / Self Care | Payer: Medicare HMO | Attending: Urgent Care | Admitting: Urgent Care

## 2021-12-02 DIAGNOSIS — I872 Venous insufficiency (chronic) (peripheral): Secondary | ICD-10-CM

## 2021-12-02 DIAGNOSIS — R609 Edema, unspecified: Secondary | ICD-10-CM | POA: Diagnosis not present

## 2021-12-02 DIAGNOSIS — I83893 Varicose veins of bilateral lower extremities with other complications: Secondary | ICD-10-CM | POA: Diagnosis not present

## 2021-12-02 DIAGNOSIS — J449 Chronic obstructive pulmonary disease, unspecified: Secondary | ICD-10-CM | POA: Diagnosis not present

## 2021-12-02 MED ORDER — TRIAMCINOLONE ACETONIDE 0.1 % EX CREA: TOPICAL_CREAM | CUTANEOUS | 0 refills | Status: DC

## 2021-12-02 NOTE — ED Provider Notes (Signed)
?[place] ? ? ? ?CSN: [id number] ?Arrival date & time: 12/02/21  1055 ? ? ?  ? ?History   ?Chief Complaint ?Chief Complaint  ?Patient presents with  ? BLE edema  ? ? ?HPI ?[name] is a 81 y.o. male.  ? ?Pleasant 81 year old male presents today with concerns of bilateral lower extremity edema.  He states all of last week he was in the ICU in [place] with his wife after she recovered from his surgery.  He reports he was pretty much sedentary the entire week with not much movement.  He just got home over the weekend and noticed that his ankles were both swollen.  He reports the swelling is uncomfortable.  He states earlier in the week he had a rash associated with the swelling, but that has improved dramatically.  He notes that the swelling is worse in the evening and better in the mornings.  It does improve with elevation.  He denies any calf pain or tenderness.  He does not smoke.  No personal history of DVT. ? ? ? ?Past Medical History:  ?Diagnosis Date  ? Abnormal chest x-ray   ? Allergic rhinitis   ? Allergy   ? Ankylosing spondylitis ([place])   ? Anxiety   ? Arthritis   ? Colonic polyp   ? Coronary atherosclerosis of native coronary artery   ? Cough   ? Dizziness 10/21/2016  ? Ganglion cyst of wrist   ? left  ? Heart attack [facility]) 2008  ? Hemorrhoids   ? History of malaria   ? Hyperlipidemia   ? with low HDL  ? Hypertension   ? Kidney stone   ? Other disorder of muscle, ligament, and fascia   ? Rotator cuff tear   ? Swelling of lower extremity 10/16/2015  ? ? ?Patient Active Problem List  ? Diagnosis Date Noted  ? Prostate cancer ([place]) 05/29/2021  ? GERD (gastroesophageal reflux disease) 12/15/2017  ? Unstable angina ([place]) 12/05/2017  ? Angina at rest [facility]) 12/05/2017  ? Atypical chest pain   ? Dyslipidemia   ? COPD not affecting current episode of care [facility])   ? Essential hypertension 05/29/2017  ? Abnormal nuclear cardiac imaging test 01/14/2017  ? Bronchiectasis without complication ([place])  40/98/1191  ? ILD (interstitial lung disease) ([place]) 11/27/2016  ? Dizziness 10/21/2016  ? Cough variant asthma  vs UACS 07/22/2016  ? Obstructive sleep apnea 12/01/2015  ? Acute bronchitis 11/30/2015  ? Swelling of lower extremity 10/16/2015  ? Benign mole 02/04/2014  ? Impaired glucose tolerance 10/26/2013  ? NAFLD (nonalcoholic fatty liver disease) 04/27/2013  ? Thrombocytopenia ([place]) 10/21/2012  ? Exertional angina ([place]) 09/03/2012  ? MALARIA 12/05/2009  ? COLONIC POLYPS 12/05/2009  ? HYPERCHOLESTEROLEMIA 12/05/2009  ? ANXIETY 12/05/2009  ? HEMORRHOIDS 12/05/2009  ? Allergic rhinitis 12/05/2009  ? GANGLION CYST, WRIST, LEFT 12/05/2009  ? ROTATOR CUFF TEAR 12/05/2009  ? OTHER DISORDER OF MUSCLE LIGAMENT AND FASCIA 12/05/2009  ? ABNORMAL CHEST XRAY 12/05/2009  ? CAD (coronary artery disease) 04/05/2009  ? ? ?Past Surgical History:  ?Procedure Laterality Date  ? bicep rupture    ? CORONARY ANGIOPLASTY WITH STENT PLACEMENT    ? HEMORRHOID SURGERY  05/2009  ? in [place]  ? LEFT HEART CATH AND CORONARY ANGIOGRAPHY N/A 01/14/2017  ? Procedure: Left Heart Cath and Coronary Angiography;  Surgeon: [name], MD;  Location: [place] CV LAB;  Service: Cardiovascular;  Laterality: N/A;  ? LEFT HEART CATHETERIZATION WITH CORONARY  ANGIOGRAM N/A 09/03/2012  ? Procedure: LEFT HEART CATHETERIZATION WITH CORONARY ANGIOGRAM;  Surgeon: [name], MD;  Location[facility] CATH LAB;  Service: Cardiovascular;  Laterality: N/A;  ? PROSTATE BIOPSY    ? ROTATOR CUFF REPAIR    ? bilat by Dr. French Ana  ? ? ? ? ? ?Home Medications   ? ?Prior to Admission medications   ?Medication Sig Start Date End Date Taking? Authorizing Provider  ?triamcinolone cream (KENALOG) 0.1 % Use a thin application twice daily to affected areas of lower extremities, not to exceed 7 days of use 12/02/21  Yes Brydon Spahr L, PA  ?albuterol (VENTOLIN HFA) 108 (90 Base) MCG/ACT inhaler Inhale into the lungs every 6 (six) hours as needed for wheezing or shortness of  breath.    [provider]  ?amLODipine (NORVASC) 5 MG tablet Take 1 tablet (5 mg total) by mouth daily. 03/13/21   [name], MD  ?Apoaequorin (PREVAGEN PO) Take 1 tablet by mouth daily.     [provider]  ?aspirin 81 MG EC tablet Take 81 mg by mouth daily.    [provider]  ?chlorpheniramine (CHLOR-TRIMETON) 4 MG tablet 1 -2 every 4 hours as needed 08/14/20   [name], MD  ?doxazosin (CARDURA) 1 MG tablet TAKE 1 TABLET EVERY DAY 09/13/21   [name], MD  ?famotidine (PEPCID) 20 MG tablet Take 20 mg by mouth at bedtime.    [provider]  ?Multiple Vitamins-Minerals (ICAPS AREDS 2) CAPS Take by mouth daily.    [provider]  ?pantoprazole (PROTONIX) 40 MG tablet TAKE 1 TABLET 1 TIME DAILY 30 TO 60 MINUTES BEFORE FIRST MEAL OF THE DAY 11/23/21   [name], MD  ?simvastatin (ZOCOR) 20 MG tablet TAKE 1 TABLET EVERY DAY WITH BREAKFAST 03/13/21   [name], MD  ?Karen Kays TO FIND Med Name: CPAP per Dr Halford Chessman    [provider]  ? ? ?Family History ?Family History  ?Problem Relation Age of Onset  ? Cancer Mother   ?     melanoma  ? Emphysema Mother   ? Rheum arthritis Mother   ? Diabetes Mother   ? Alcohol abuse Father   ? ? ?Social History ?Social History  ? ?Tobacco Use  ? Smoking status: Never  ? Smokeless tobacco: Never  ?Vaping Use  ? Vaping Use: Never used  ?Substance Use Topics  ? Alcohol use: Yes  ?  Comment: holiday Rare   ? Drug use: No  ? ? ? ?Allergies   ?Celebrex [celecoxib] and Tramadol ? ? ?Review of Systems ?Review of Systems  ?Cardiovascular:  Positive for leg swelling.  ?Skin:  Positive for rash.  ? ? ?Physical Exam ?Triage Vital Signs ?ED Triage Vitals [12/02/21 1221]  ?Enc Vitals Group  ?   BP 126/71  ?   Pulse Rate 81  ?   Resp 18  ?   Temp 98.2 ?F (36.8 ?C)  ?   Temp Source Oral  ?   SpO2 93 %  ?   Weight   ?   Height   ?   Head Circumference   ?   Peak Flow   ?   Pain Score 0  ?   Pain Loc   ?   Pain Edu?   ?   Excl. in  [place]?   ? ?No data found. ? ?Updated Vital Signs ?BP 126/71 (BP Location: Right Arm)   Pulse 81   Temp 98.2 ?F (  36.8 ?C) (Oral)   Resp 18   SpO2 93%  ? ?Visual Acuity ?Right Eye Distance:   ?Left Eye Distance:   ?Bilateral Distance:   ? ?Right Eye Near:   ?Left Eye Near:    ?Bilateral Near:    ? ?Physical Exam ?Vitals and nursing note reviewed.  ?Constitutional:   ?   General: He is not in acute distress. ?   Appearance: Normal appearance. He is well-developed and normal weight. He is not ill-appearing, toxic-appearing or diaphoretic.  ?HENT:  ?   Head: Normocephalic and atraumatic.  ?   Right Ear: External ear normal.  ?   Left Ear: External ear normal.  ?Eyes:  ?   Conjunctiva/sclera: Conjunctivae normal.  ?   Pupils: Pupils are equal, round, and reactive to light.  ?Cardiovascular:  ?   Rate and Rhythm: Normal rate and regular rhythm.  ?   Pulses: Normal pulses.  ?   Heart sounds: Normal heart sounds.  ?Pulmonary:  ?   Effort: Pulmonary effort is normal. No respiratory distress.  ?   Breath sounds: Normal breath sounds.  ?Abdominal:  ?   Palpations: Abdomen is soft.  ?Musculoskeletal:  ?   Cervical back: Neck supple.  ?   Right lower leg: Edema present.  ?   Left lower leg: Edema present.  ?   Comments: Negative Homan sign ?FROM ankles, toes, and knees ?Edema bilaterally ?Significant varicosities noted to lower extremities, L>R ?Unable to test cap refill due to severe onychomycosis  ?Skin: ?   General: Skin is warm and dry.  ?   Capillary Refill: Capillary refill takes less than 2 seconds.  ?   Coloration: Skin is not jaundiced.  ?   Findings: Rash (minimal rash of venous stasis dermatitis noted bilaterally, R>L) present. No bruising.  ?Neurological:  ?   General: No focal deficit present.  ?   Mental Status: He is alert and oriented to person, place, and time.  ?   Motor: No weakness.  ?   Gait: Gait normal.  ?Psychiatric:     ?   Mood and Affect: Mood normal.  ? ? ? ?UC Treatments / Results  ?Labs ?(all labs  ordered are listed, but only abnormal results are displayed) ?Labs Reviewed - No data to display ? ?EKG ? ? ?Radiology ?No results found. ? ?Procedures ?Procedures (including critical care time) ? ?Medicatio

## 2021-12-02 NOTE — Discharge Instructions (Signed)
Your swelling is called edema, this is likely dependent meaning related to not much movement over the past week. ?I would encourage you to purchase compression hose.  These need to be medical grade.  You can get this at [facility] supply store located at [address]. [address].  Phone [phone].  They open Monday morning at 9 AM. ?I would also elevate your legs.  Lay on the couch with your back flat on the cushions, put your legs on the armrest with your knees bent.  Do this several times daily to help with the edema. ?Your rash is due to venous stasis dermatitis.  I have called in a topical cream for you to use for the next 5 to 7 days.  Please follow-up with your PCP should symptoms persist. ?If your edema does not improve, or if you develop severe pain in your calfs, please had to the emergency room. ?

## 2021-12-02 NOTE — ED Triage Notes (Signed)
Pt c/o bilat lower extrim edema that is painful to touch. Describes feeling "hot flashes" moving from legs up through the body.  ?

## 2021-12-04 ENCOUNTER — Encounter: Payer: Self-pay | Admitting: Family Medicine

## 2021-12-04 ENCOUNTER — Ambulatory Visit (INDEPENDENT_AMBULATORY_CARE_PROVIDER_SITE_OTHER): Payer: Medicare HMO | Admitting: Family Medicine

## 2021-12-04 VITALS — BP 120/60 | HR 78 | Temp 98.0°F | Resp 16 | Wt 169.8 lb

## 2021-12-04 DIAGNOSIS — R600 Localized edema: Secondary | ICD-10-CM | POA: Diagnosis not present

## 2021-12-04 DIAGNOSIS — N400 Enlarged prostate without lower urinary tract symptoms: Secondary | ICD-10-CM | POA: Insufficient documentation

## 2021-12-04 LAB — CBC WITH DIFFERENTIAL/PLATELET
Basophils Absolute: 0.1 10*3/uL (ref 0.0–0.1)
Basophils Relative: 0.9 % (ref 0.0–3.0)
Eosinophils Absolute: 0.1 10*3/uL (ref 0.0–0.7)
Eosinophils Relative: 0.8 % (ref 0.0–5.0)
HCT: 41.1 % (ref 39.0–52.0)
Hemoglobin: 13.4 g/dL (ref 13.0–17.0)
Lymphocytes Relative: 8.4 % — ABNORMAL LOW (ref 12.0–46.0)
Lymphs Abs: 0.8 10*3/uL (ref 0.7–4.0)
MCHC: 32.7 g/dL (ref 30.0–36.0)
MCV: 89.7 fl (ref 78.0–100.0)
Monocytes Absolute: 0.9 10*3/uL (ref 0.1–1.0)
Monocytes Relative: 9.8 % (ref 3.0–12.0)
Neutro Abs: 7.6 10*3/uL (ref 1.4–7.7)
Neutrophils Relative %: 80.1 % — ABNORMAL HIGH (ref 43.0–77.0)
Platelets: 309 10*3/uL (ref 150.0–400.0)
RBC: 4.59 Mil/uL (ref 4.22–5.81)
RDW: 21 % — ABNORMAL HIGH (ref 11.5–15.5)
WBC: 9.5 10*3/uL (ref 4.0–10.5)

## 2021-12-04 LAB — BASIC METABOLIC PANEL
BUN: 21 mg/dL (ref 6–23)
CO2: 28 mEq/L (ref 19–32)
Calcium: 9.6 mg/dL (ref 8.4–10.5)
Chloride: 103 mEq/L (ref 96–112)
Creatinine, Ser: 0.8 mg/dL (ref 0.40–1.50)
GFR: 84.78 mL/min (ref >60.00)
Glucose, Bld: 169 mg/dL — ABNORMAL HIGH (ref 70–99)
Potassium: 4.6 mEq/L (ref 3.5–5.1)
Sodium: 140 mEq/L (ref 135–145)

## 2021-12-04 LAB — HEPATIC FUNCTION PANEL
ALT: 20 U/L (ref 0–53)
AST: 23 U/L (ref 0–37)
Albumin: 4.7 g/dL (ref 3.5–5.2)
Alkaline Phosphatase: 72 U/L (ref 39–117)
Bilirubin, Direct: 0.2 mg/dL (ref 0.0–0.3)
Total Bilirubin: 0.7 mg/dL (ref 0.2–1.2)
Total Protein: 6.9 g/dL (ref 6.0–8.3)

## 2021-12-04 LAB — TSH: TSH: 2.9 u[IU]/mL (ref 0.35–5.50)

## 2021-12-04 MED ORDER — FUROSEMIDE 20 MG PO TABS: 20.0000 mg | ORAL_TABLET | Freq: Every day | ORAL | 0 refills | Status: DC

## 2021-12-04 NOTE — Patient Instructions (Signed)
Follow up with Dr Carlota Raspberry in 2-3 weeks ?We'll notify you of your lab results and make any changes if needed ?Make sure you are drinking plenty of water ?Wear compression socks during the day ?Try and elevate your legs when sitting ?Limit your salt/sodium intake.  There is a lot of salt/sodium in restaurant food, take out, soups, processed meats (bacon, ham, lunch meat), and frozen dinners ?START the Furosemide once daily for the next 3 days and then use as needed for swelling ?Call with any questions or concerns ?Hang in there!!! ?

## 2021-12-04 NOTE — Progress Notes (Signed)
? ?  Subjective:  ? ? Patient ID: [name], male    DOB: Oct 07, 1940, 81 y.o.   MRN: [id number] ? ?HPI ?ER f/u- pt was seen in ER on 4/23 for bilateral leg swelling, rash, and discomfort.  He was dx'd w/ dependent edema, varicose veins, and stasis dermatitis.  Was encouraged to wear compression socks, elevate legs, and start Triamcinolone on the rash. ? ?Pt sat at his wife's beside in ICU for the 6 days prior to evaluation.  Had bright red spot on R lower leg, leg was itchy, developed a rash ? ?Pt had L heart cath in 2018 that showed normal systolic fxn and end diastolic pressure. ? ? ?Review of Systems ?For ROS see HPI  ?   ?Objective:  ? Physical Exam ?Vitals reviewed.  ?Constitutional:   ?   General: He is not in acute distress. ?   Appearance: Normal appearance. He is not ill-appearing.  ?HENT:  ?   Head: Normocephalic and atraumatic.  ?Cardiovascular:  ?   Rate and Rhythm: Normal rate and regular rhythm.  ?   Pulses: Normal pulses.  ?   Heart sounds: Normal heart sounds.  ?Pulmonary:  ?   Effort: Pulmonary effort is normal. No respiratory distress.  ?   Breath sounds: Normal breath sounds. No wheezing or rhonchi.  ?Abdominal:  ?   General: Abdomen is flat. There is no distension.  ?   Palpations: Abdomen is soft.  ?   Tenderness: There is no abdominal tenderness. There is no guarding or rebound.  ?Musculoskeletal:  ?   Cervical back: Normal range of motion and neck supple.  ?   Right lower leg: Edema (trace swelling to ankle) present.  ?   Left lower leg: Edema (trace swelling to ankle) present.  ?Skin: ?   General: Skin is warm and dry.  ?Neurological:  ?   General: No focal deficit present.  ?   Mental Status: He is alert and oriented to person, place, and time.  ?Psychiatric:     ?   Mood and Affect: Mood normal.     ?   Behavior: Behavior normal.  ? ? ? ? ? ?   ?Assessment & Plan:  ?LE edema- new.  Pt reports sxs started while his wife was in the ICU at [facility] and he was very sedentary for 6 days.  Denies  high sodium intake but was eating cafeteria food for the week.  Sxs do improve w/ elevation and compression.  Denies CP, SOB, HAs, visual changes.  Reviewed his 2018 cath report and at that time he had no evidence of CHF (systolic or diastolic).  No TTP over calves or posterior legs.  No palpable cord that would be suspicious for DVT.  Check labs to r/o anemia, thyroid abnormality, liver, or kidney dysfxn.  Will start 3 days of low dose Lasix and then pt is to take as needed for swelling.  Pt will f/u w/ PCP to assess swelling and determine if f/u Cards visit is needed.  Pt expressed understanding and is in agreement w/ plan.  ? ?

## 2021-12-10 DIAGNOSIS — C61 Malignant neoplasm of prostate: Secondary | ICD-10-CM | POA: Diagnosis not present

## 2021-12-11 DIAGNOSIS — L298 Other pruritus: Secondary | ICD-10-CM | POA: Diagnosis not present

## 2021-12-11 DIAGNOSIS — L570 Actinic keratosis: Secondary | ICD-10-CM | POA: Diagnosis not present

## 2021-12-18 ENCOUNTER — Other Ambulatory Visit: Payer: Self-pay | Admitting: Cardiovascular Disease

## 2021-12-19 ENCOUNTER — Encounter: Payer: Self-pay | Admitting: Family Medicine

## 2021-12-19 ENCOUNTER — Ambulatory Visit (INDEPENDENT_AMBULATORY_CARE_PROVIDER_SITE_OTHER): Payer: Medicare HMO | Admitting: Family Medicine

## 2021-12-19 VITALS — BP 114/62 | HR 84 | Temp 98.2°F | Resp 16 | Ht 67.0 in | Wt 169.0 lb

## 2021-12-19 DIAGNOSIS — C61 Malignant neoplasm of prostate: Secondary | ICD-10-CM

## 2021-12-19 DIAGNOSIS — R7303 Prediabetes: Secondary | ICD-10-CM | POA: Diagnosis not present

## 2021-12-19 DIAGNOSIS — R600 Localized edema: Secondary | ICD-10-CM | POA: Diagnosis not present

## 2021-12-19 LAB — POCT GLYCOSYLATED HEMOGLOBIN (HGB A1C): Hemoglobin A1C: 5.8 % — AB (ref 4.0–5.6)

## 2021-12-19 LAB — GLUCOSE, POCT (MANUAL RESULT ENTRY): POC Glucose: 129 mg/dL — AB (ref 70–99)

## 2021-12-19 NOTE — Patient Instructions (Addendum)
Walking or other low intensity exercise most days per week can help with blood sugars, and with stress management. Repeat blood sugar test in 3 months, then if stable then we can check every 6 months.  ? ?swelling looks good today.  If you do require furosemide frequently, or any worsening swelling please follow-up. ?  ?It appears Dr. [name] wanted to see you in 6 months from February visit for psa/prostate check - call and make sure you have that appointment.  ? ?Take care . ? ?Prediabetes ?Prediabetes is when your blood sugar (blood glucose) level is higher than normal but not high enough for you to be diagnosed with type 2 diabetes. Having prediabetes puts you at risk for developing type 2 diabetes (type 2 diabetes mellitus). ?With certain lifestyle changes, you may be able to prevent or delay the onset of type 2 diabetes. This is important because type 2 diabetes can lead to serious complications, such as: ?Heart disease. ?Stroke. ?Blindness. ?Kidney disease. ?Depression. ?Poor circulation in the feet and legs. In severe cases, this could lead to surgical removal of a leg (amputation). ?What are the causes? ?The exact cause of prediabetes is not known. It may result from insulin resistance. Insulin resistance develops when cells in the body do not respond properly to insulin that the body makes. This can cause excess glucose to build up in the blood. High blood glucose (hyperglycemia) can develop. ?What increases the risk? ?The following factors may make you more likely to develop this condition: ?You have a family member with type 2 diabetes. ?You are older than 45 years. ?You had a temporary form of diabetes during a pregnancy (gestational diabetes). ?You had polycystic ovary syndrome (PCOS). ?You are overweight or obese. ?You are inactive (sedentary). ?You have a history of heart disease, including problems with cholesterol levels, high levels of blood fats, or high blood pressure. ?What are the signs or  symptoms? ?You may have no symptoms. If you do have symptoms, they may include: ?Increased hunger. ?Increased thirst. ?Increased urination. ?Vision changes, such as blurry vision. ?Tiredness (fatigue). ?How is this diagnosed? ?This condition can be diagnosed with blood tests. Your blood glucose may be checked with one or more of the following tests: ?A fasting blood glucose (FBG) test. You will not be allowed to eat (you will fast) for at least 8 hours before a blood sample is taken. ?An A1C blood test (hemoglobin A1C). This test provides information about blood glucose levels over the previous 2?3 months. ?An oral glucose tolerance test (OGTT). This test measures your blood glucose at two points in time: ?After fasting. This is your baseline level. ?Two hours after you drink a beverage that contains glucose. ?You may be diagnosed with prediabetes if: ?Your FBG is 100?125 mg/dL (5.6-6.9 mmol/L). ?Your A1C level is 5.7?6.4% (39-46 mmol/mol). ?Your OGTT result is 140?199 mg/dL (7.8-11 mmol/L). ?These blood tests may be repeated to confirm your diagnosis. ?How is this treated? ?Treatment may include dietary and lifestyle changes to help lower your blood glucose and prevent type 2 diabetes from developing. In some cases, medicine may be prescribed to help lower the risk of type 2 diabetes. ?Follow these instructions at home: ?Nutrition ? ?Follow a healthy meal plan. This includes eating lean proteins, whole grains, legumes, fresh fruits and vegetables, low-fat dairy products, and healthy fats. ?Follow instructions from your health care provider about eating or drinking restrictions. ?Meet with a dietitian to create a healthy eating plan that is right for you. ?Lifestyle ?Do  moderate-intensity exercise for at least 30 minutes a day on 5 or more days each week, or as told by your health care provider. A mix of activities may be best, such as: ?Brisk walking, swimming, biking, and weight lifting. ?Lose weight as told by  your health care provider. Losing 5-7% of your body weight can reverse insulin resistance. ?Do not drink alcohol if: ?Your health care provider tells you not to drink. ?You are pregnant, may be pregnant, or are planning to become pregnant. ?If you drink alcohol: ?Limit how much you use to: ?0-1 drink a day for women. ?0-2 drinks a day for men. ?Be aware of how much alcohol is in your drink. In the [place] one drink equals one 12 oz bottle of beer (355 mL), one 5 oz glass of wine (148 mL), or one 1? oz glass of hard liquor (44 mL). ?General instructions ?Take over-the-counter and prescription medicines only as told by your health care provider. You may be prescribed medicines that help lower the risk of type 2 diabetes. ?Do not use any products that contain nicotine or tobacco, such as cigarettes, e-cigarettes, and chewing tobacco. If you need help quitting, ask your health care provider. ?Keep all follow-up visits. This is important. ?Where to find more information ?American Diabetes Association: www.diabetes.org ?Academy of Nutrition and Dietetics: www.eatright.org ?American Heart Association: www.heart.org ?Contact a health care provider if: ?You have any of these symptoms: ?Increased hunger. ?Increased urination. ?Increased thirst. ?Fatigue. ?Vision changes, such as blurry vision. ?Get help right away if you: ?Have shortness of breath. ?Feel confused. ?Vomit or feel like you may vomit. ?Summary ?Prediabetes is when your blood sugar (blood glucose)level is higher than normal but not high enough for you to be diagnosed with type 2 diabetes. ?Having prediabetes puts you at risk for developing type 2 diabetes (type 2 diabetes mellitus). ?Make lifestyle changes such as eating a healthy diet and exercising regularly to help prevent diabetes. Lose weight as told by your health care provider. ?This information is not intended to replace advice given to you by your health care provider. Make sure you discuss any questions  you have with your health care provider. ?Document Revised: 10/28/2019 Document Reviewed: 10/28/2019 ?Elsevier Patient Education ? [place]. ? ?

## 2021-12-19 NOTE — Progress Notes (Signed)
? ?Subjective:  ?Patient ID: [name], male    DOB: [birth year]  Age: 81 y.o. MRN: [id number] ? ?CC:  ?Chief Complaint  ?Patient presents with  ? Edema  ?  Pt here for couple week follow up notes the swelling has resolved   ? Prediabetes  ?  Pt also would like to discuss results stating his sugar was elevated   ? ? ?HPI ?[name] presents for  ? ?Pedal edema: ?Treated by my colleague 25th.  Was eating cafeteria food, more sedentary during that time as his wife was in the ICU.  Symptoms improved with elevation, compression.  No evidence of CHF from prior cath.  No pain or signs of DVT.  3 days of low-dose furosemide 20 mg daily, then as needed.  Reassuring TSH, CBC, hepatic function panel and BMP except for hyperglycemia.  ?Took lasix for 3 days only - not needed since. Doing well.  ?Wife doing ok - back for surgery on the 23rd.  ? ?Prediabetes: ?Lab work obtained at evaluation for edema, glucose 169, previously 111 in September 2022. ?No increased thirst, no blurry vision.  ?No regular sodas, some half sweet tea.  ?Less exercise lately. Prior [employer]. Walking dogs. Up and down steps at home. Having to help spouse at home.  ? ?On active surveillance for prostate CA - Dr. [name]. Last appt 2/8. Note reviewed. 75monthfollow-up for PSA, recheck. ?Lab Results  ?Component Value Date  ? HGBA1C 5.8 (A) 12/19/2021  ? ?Wt Readings from Last 3 Encounters:  ?12/19/21 169 lb (76.7 kg)  ?12/04/21 169 lb 12.8 oz (77 kg)  ?09/21/21 173 lb 6.4 oz (78.7 kg)  ? ? ?History ?Patient Active Problem List  ? Diagnosis Date Noted  ? Hypertrophy of prostate without urinary obstruction and other lower urinary tract symptoms (LUTS) 12/04/2021  ? Prostate cancer ([facility] 05/29/2021  ? GERD (gastroesophageal reflux disease) 12/15/2017  ? Unstable angina ([facility] 12/05/2017  ? Angina at rest ([facility] 12/05/2017  ? Atypical chest pain   ? Dyslipidemia   ? COPD not affecting current episode of care ([facility]   ? Essential hypertension 05/29/2017  ?  Abnormal nuclear cardiac imaging test 01/14/2017  ? Bronchiectasis without complication ([facility] 078/29/5621 ? ILD (interstitial lung disease) ([facility] 11/27/2016  ? Dizziness 10/21/2016  ? Cough variant asthma  vs UACS 07/22/2016  ? Obstructive sleep apnea 12/01/2015  ? Acute bronchitis 11/30/2015  ? Swelling of lower extremity 10/16/2015  ? Benign mole 02/04/2014  ? Impaired glucose tolerance 10/26/2013  ? NAFLD (nonalcoholic fatty liver disease) 04/27/2013  ? [place]rombocytopenia ([facility] 10/21/2012  ? Exertional angina ([facility] 09/03/2012  ? MALARIA 12/05/2009  ? COLONIC POLYPS 12/05/2009  ? HYPERCHOLESTEROLEMIA 12/05/2009  ? ANXIETY 12/05/2009  ? HEMORRHOIDS 12/05/2009  ? Allergic rhinitis 12/05/2009  ? GANGLION CYST, WRIST, LEFT 12/05/2009  ? ROTATOR CUFF TEAR 12/05/2009  ? OTHER DISORDER OF MUSCLE LIGAMENT AND FASCIA 12/05/2009  ? ABNORMAL CHEST XRAY 12/05/2009  ? CAD (coronary artery disease) 04/05/2009  ? ?Past Medical History:  ?Diagnosis Date  ? Abnormal chest x-ray   ? Allergic rhinitis   ? Allergy   ? [place]osing [place]ondylitis ([facility]   ? Anxiety   ? Arthritis   ? Colonic polyp   ? Coronary atherosclerosis of native coronary artery   ? Cough   ? Dizziness 10/21/2016  ? Ganglion cyst of wrist   ? left  ? Heart attack ([facility] 2008  ? Hemorrhoids   ? History of malaria   ?  Hyperlipidemia   ? with low HDL  ? Hypertension   ? Kidney stone   ? Other disorder of muscle, ligament, and fascia   ? Rotator cuff tear   ? Swelling of lower extremity 10/16/2015  ? ?Past Surgical History:  ?Procedure Laterality Date  ? bicep rupture    ? CORONARY ANGIOPLASTY WITH STENT PLACEMENT    ? HEMORRHOID SURGERY  05/2009  ? in [place]  ? LEFT HEART CATH AND CORONARY ANGIOGRAPHY N/A 01/14/2017  ? Procedure: Left Heart Cath and Coronary Angiography;  Surgeon: [name], MD;  Location: [facility];  Service: Cardiovascular;  Laterality: N/A;  ? LEFT HEART CATHETERIZATION WITH CORONARY ANGIOGRAM N/A 09/03/2012  ? Procedure: LEFT HEART  CATHETERIZATION WITH CORONARY ANGIOGRAM;  Surgeon: [name], MD;  Location: [facility] CATH LAB;  Service: Cardiovascular;  Laterality: N/A;  ? PROSTATE BIOPSY    ? ROTATOR CUFF REPAIR    ? bilat by Dr. French Ana  ? ?Allergies  ?Allergen Reactions  ? Celebrex [Celecoxib]   ?  Pt states precipitated his heart attack.  ? Tramadol Other (See Comments)  ?  Makes patient jumpy  ? ?Prior to Admission medications   ?Medication Sig Start Date End Date Taking? Authorizing Provider  ?amLODipine (NORVASC) 5 MG tablet Take 1 tablet (5 mg total) by mouth daily. 03/13/21  Yes [name], MD  ?Apoaequorin (PREVAGEN PO) Take 1 tablet by mouth daily.    Yes [provider]  ?aspirin 81 MG EC tablet Take 81 mg by mouth daily.   Yes [provider]  ?chlorpheniramine (CHLOR-TRIMETON) 4 MG tablet 1 -2 every 4 hours as needed 08/14/20  Yes [name], MD  ?ciclopirox (LOPROX) 0.77 % cream Apply topically 2 (two) times daily.   Yes [provider]  ?doxazosin (CARDURA) 1 MG tablet TAKE 1 TABLET EVERY DAY 09/13/21  Yes [name], MD  ?famotidine (PEPCID) 20 MG tablet Take 20 mg by mouth at bedtime.   Yes [provider]  ?Fluorouracil (TOLAK) 4 % CREA Apply topically.   Yes [provider]  ?furosemide (LASIX) 20 MG tablet Take 1 tablet (20 mg total) by mouth daily. 12/04/21  Yes Midge Minium, MD  ?Multiple Vitamins-Minerals (ICAPS AREDS 2) CAPS Take by mouth daily.   Yes [provider]  ?pantoprazole (PROTONIX) 40 MG tablet TAKE 1 TABLET 1 TIME DAILY 30 TO 60 MINUTES BEFORE FIRST MEAL OF THE DAY 11/23/21  Yes [name], MD  ?simvastatin (ZOCOR) 20 MG tablet TAKE 1 TABLET EVERY DAY WITH BREAKFAST (APPOINTMENT IS NEEDED FOR REFILLS) 12/19/21  Yes [name], MD  ?Karen Kays TO FIND Med Name: CPAP per Dr Halford Chessman   Yes [provider]  ?albuterol (VENTOLIN HFA) 108 (90 Base) MCG/ACT inhaler Inhale into the lungs every 6 (six) hours as needed for wheezing or  shortness of breath. ?Patient not taking: Reported on 12/04/2021    [provider]  ?triamcinolone cream (KENALOG) 0.1 % Use a thin application twice daily to affected areas of lower extremities, not to exceed 7 days of use ?Patient not taking: Reported on 12/19/2021 12/02/21   Chaney Malling, PA  ? ?Social History  ? ?Socioeconomic History  ? Marital status: Married  ?  Spouse name: [name]  ? Number of children: 2  ? Years of education: Not on file  ? Highest education level: Not on file  ?Occupational History  ? Not on file  ?Tobacco Use  ? Smoking status: Never  ?  Smokeless tobacco: Never  ?Vaping Use  ? Vaping Use: Never used  ?Substance and Sexual Activity  ? Alcohol use: Yes  ?  Comment: holiday Rare   ? Drug use: No  ? Sexual activity: Not Currently  ?Other Topics Concern  ? Not on file  ?Social History Narrative  ? Not on file  ? ?Social Determinants of Health  ? ?Financial Resource Strain: Not on file  ?Food Insecurity: Not on file  ?Transportation Needs: Not on file  ?Physical Activity: Not on file  ?Stress: Not on file  ?Social Connections: Not on file  ?Intimate Partner Violence: Not on file  ? ? ?Review of Systems ? ?Per HPI ?Objective:  ? ?Vitals:  ? 12/19/21 0959  ?BP: 114/62  ?Pulse: 84  ?Resp: 16  ?Temp: 98.2 ?F (36.8 ?C)  ?TempSrc: Temporal  ?SpO2: 95%  ?Weight: 169 lb (76.7 kg)  ?Height: '5\' 7"'$  (1.702 m)  ? ? ? ?Physical Exam ?Vitals reviewed.  ?Constitutional:   ?   Appearance: He is well-developed.  ?HENT:  ?   Head: Normocephalic and atraumatic.  ?Neck:  ?   Vascular: No carotid bruit or JVD.  ?Cardiovascular:  ?   Rate and Rhythm: Normal rate and regular rhythm.  ?   Heart sounds: Normal heart sounds. No murmur heard. ?Pulmonary:  ?   Effort: Pulmonary effort is normal.  ?   Breath sounds: Normal breath sounds. No rales.  ?Musculoskeletal:  ?   Right lower leg: No edema.  ?   Left lower leg: No edema.  ?Skin: ?   General: Skin is warm and dry.  ?Neurological:  ?   Mental Status: He is  alert and oriented to person, place, and time.  ?Psychiatric:     ?   Mood and Affect: Mood normal.  ? ? ?34 minutes spent during visit, including chart review, urology [facility] review and discussion of that plan,  counseling a

## 2022-02-07 ENCOUNTER — Encounter: Payer: Self-pay | Admitting: Family Medicine

## 2022-02-07 ENCOUNTER — Ambulatory Visit (INDEPENDENT_AMBULATORY_CARE_PROVIDER_SITE_OTHER): Payer: Medicare HMO | Admitting: Family Medicine

## 2022-02-07 VITALS — BP 110/60 | HR 78 | Temp 98.1°F | Resp 16 | Ht 67.0 in | Wt 165.8 lb

## 2022-02-07 DIAGNOSIS — R351 Nocturia: Secondary | ICD-10-CM

## 2022-02-07 DIAGNOSIS — R39198 Other difficulties with micturition: Secondary | ICD-10-CM | POA: Diagnosis not present

## 2022-02-07 DIAGNOSIS — R7303 Prediabetes: Secondary | ICD-10-CM | POA: Diagnosis not present

## 2022-02-07 DIAGNOSIS — C61 Malignant neoplasm of prostate: Secondary | ICD-10-CM

## 2022-02-07 DIAGNOSIS — E785 Hyperlipidemia, unspecified: Secondary | ICD-10-CM | POA: Diagnosis not present

## 2022-02-07 DIAGNOSIS — R413 Other amnesia: Secondary | ICD-10-CM | POA: Diagnosis not present

## 2022-02-07 DIAGNOSIS — R300 Dysuria: Secondary | ICD-10-CM

## 2022-02-07 DIAGNOSIS — H6123 Impacted cerumen, bilateral: Secondary | ICD-10-CM

## 2022-02-07 DIAGNOSIS — Z0000 Encounter for general adult medical examination without abnormal findings: Secondary | ICD-10-CM

## 2022-02-07 LAB — COMPREHENSIVE METABOLIC PANEL
ALT: 17 U/L (ref 0–53)
AST: 23 U/L (ref 0–37)
Albumin: 4.8 g/dL (ref 3.5–5.2)
Alkaline Phosphatase: 69 U/L (ref 39–117)
BUN: 20 mg/dL (ref 6–23)
CO2: 32 mEq/L (ref 19–32)
Calcium: 9.9 mg/dL (ref 8.4–10.5)
Chloride: 102 mEq/L (ref 96–112)
Creatinine, Ser: 0.93 mg/dL (ref 0.40–1.50)
GFR: 78.56 mL/min (ref >60.00)
Glucose, Bld: 104 mg/dL — ABNORMAL HIGH (ref 70–99)
Potassium: 4.5 mEq/L (ref 3.5–5.1)
Sodium: 141 mEq/L (ref 135–145)
Total Bilirubin: 0.8 mg/dL (ref 0.2–1.2)
Total Protein: 7.3 g/dL (ref 6.0–8.3)

## 2022-02-07 LAB — LIPID PANEL
Cholesterol: 96 mg/dL (ref 0–200)
HDL: 36.6 mg/dL — ABNORMAL LOW (ref >39.00)
LDL Cholesterol: 45 mg/dL (ref 0–99)
NonHDL: 59.64
Total CHOL/HDL Ratio: 3
Triglycerides: 73 mg/dL (ref 0.0–149.0)
VLDL: 14.6 mg/dL (ref 0.0–40.0)

## 2022-02-07 LAB — VITAMIN B12: Vitamin B-12: 1486 pg/mL — ABNORMAL HIGH (ref 211–911)

## 2022-02-07 LAB — TSH: TSH: 3.45 u[IU]/mL (ref 0.35–5.50)

## 2022-02-07 LAB — HEMOGLOBIN A1C: Hgb A1c MFr Bld: 5.7 % (ref 4.6–6.5)

## 2022-02-07 MED ORDER — DOXAZOSIN MESYLATE 1 MG PO TABS: 1.0000 mg | ORAL_TABLET | Freq: Every day | ORAL | 1 refills | Status: DC

## 2022-02-07 NOTE — Progress Notes (Addendum)
Subjective:  Patient ID: [name], male    DOB: 27-Mar-1941  Age: 81 y.o. MRN: [id number]  CC:  Chief Complaint  Patient presents with   Annual Exam    Pt is no fasting had coffee.     HPI [name] presents for Annual Exam Recently seen in May for pedal edema and prediabetes,  edema was improving after furosemide and adjustment of diet/sodium. On active surveillance for prostate cancer with urology, Dr. [name].  60-monthfollow-up planned for PSA from February.  Care team: PCP, me Urology Dr. GAbner GreenspanDermatology Dr. GPearline CablesRheumatology Dr. DEstanislado Pandy chronic back pain, osteoarthritis, joint pain, interstitial lung disease Hematology, Dr. MEarlie Server thrombocytopenia, eval September 2022.  Continued observation with CBC, metabolic panel and LDH in 1 year. Cardiology, Dr. CBurt Knack CAD, HLD, HTN ENT, Dr. TBenjamine Mola sensorineural hearing loss,tinnitus Pulmonary, Dr. WMelvyn Novas cough variant asthma/upper airway cough syndrome.  Fasting today.   Concern about short term memory at times. Past year. Not affecting activities,just noticed less sharpness.  Not getting lost, forgetting faces, independent in ADL/IADL.  Takes prevagen.     02/07/2022    9:08 AM 12/04/2021   11:11 AM 03/12/2021    8:05 AM 09/11/2020    8:34 AM 04/10/2020    8:28 AM  Depression screen PHQ 2/9  Decreased Interest 0 0 0 0 0  Down, Depressed, Hopeless 0 0 0 0 0  PHQ - 2 Score 0 0 0 0 0  Altered sleeping  0     Tired, decreased energy  0     Change in appetite  0     Feeling bad or failure about yourself   0     Trouble concentrating  0     Moving slowly or fidgety/restless  0     Suicidal thoughts  0     PHQ-9 Score  0     Difficult doing work/chores  Not difficult at all       Health Maintenance  Topic Date Due   COVID-19 Vaccine (4 - Booster for [place]ckseries) 02/23/2022 (Originally 09/07/2020)   Zoster Vaccines- Shingrix (1 of 2) 03/21/2022 (Originally 06/08/1960)   Hepatitis C Screening  12/20/2022  (Originally 06/08/1959)   INFLUENZA VACCINE  03/12/2022   TETANUS/TDAP  07/31/2028   Pneumonia Vaccine 81 Years old  Completed   HPV VACCINES  Aged Out   COLONOSCOPY (Pts 45-464yrInsurance coverage will need to be confirmed)  Discontinued  Colon: last one in 2018 [facility]  Will be meeting with general surgery for anal tag.  Prostate cancer, on active surveillance, followed by urology. Plans to discuss RFA as option. On cardura, still weak flow, min nocturia. Plans to discuss with urology.   Prediabetes: Weight down few pounds. No meds for dm/predm.  Lab Results  Component Value Date   HGBA1C 5.8 (A) 12/19/2021   Wt Readings from Last 3 Encounters:  02/07/22 165 lb 12.8 oz (75.2 kg)  12/19/21 169 lb (76.7 kg)  12/04/21 169 lb 12.8 oz (77 kg)     Immunization History  Administered Date(s) Administered   Fluad Quad(high Dose 65+) 05/01/2019, 05/28/2021   Influenza Split 06/01/2012   Influenza Whole 05/30/2009, 04/20/2010   Influenza, High Dose Seasonal PF 05/19/2017, 05/19/2018   Influenza-Unspecified 05/13/2007, 07/12/2013, 05/10/2016, 06/23/2020   PFIZER(Purple Top)SARS-COV-2 Vaccination 09/18/2019, 10/09/2019, 07/13/2020   Pneumococcal Conjugate-13 05/19/2018   Pneumococcal Polysaccharide-23 08/22/2009   Tdap 12/04/2010, 06/04/2011, 07/31/2018   Zoster,  Live 05/17/2008  Shingrix - recommended at his pharmacy Covid bivalent booster discussed, declines  Vision Screening   Right eye Left eye Both eyes  Without correction     With correction 20/20 20/30 20/20-1  Optho yearly - plans to schedule with new provider   Dental:every 6 months. Will be changing for better coverage.   Alcohol: none  Tobacco: none  Exercise: stays active, no regular CV exercise. Has apt with cardiology in few months. On silver sneakers at [facility] until wife's surgery.    History Patient Active Problem List   Diagnosis Date Noted    Hypertrophy of prostate without urinary obstruction and other lower urinary tract symptoms (LUTS) 12/04/2021   Prostate cancer ([place]) 05/29/2021   GERD (gastroesophageal reflux disease) 12/15/2017   Unstable angina ([place]) 12/05/2017   Angina at rest [facility]) 12/05/2017   Atypical chest pain    Dyslipidemia    COPD not affecting current episode of care [facility])    Essential hypertension 05/29/2017   Abnormal nuclear cardiac imaging test 01/14/2017   Bronchiectasis without complication ([place]) 44/10/4740   ILD (interstitial lung disease) ([place]) 11/27/2016   Dizziness 10/21/2016   Cough variant asthma  vs UACS 07/22/2016   Obstructive sleep apnea 12/01/2015   Acute bronchitis 11/30/2015   Swelling of lower extremity 10/16/2015   Benign mole 02/04/2014   Impaired glucose tolerance 10/26/2013   NAFLD (nonalcoholic fatty liver disease) 04/27/2013   Thrombocytopenia ([place]) 10/21/2012   Exertional angina ([place]) 09/03/2012   MALARIA 12/05/2009   COLONIC POLYPS 12/05/2009   HYPERCHOLESTEROLEMIA 12/05/2009   ANXIETY 12/05/2009   HEMORRHOIDS 12/05/2009   Allergic rhinitis 12/05/2009   GANGLION CYST, WRIST, LEFT 12/05/2009   ROTATOR CUFF TEAR 12/05/2009   OTHER DISORDER OF MUSCLE LIGAMENT AND FASCIA 12/05/2009   ABNORMAL CHEST XRAY 12/05/2009   CAD (coronary artery disease) 04/05/2009   Past Medical History:  Diagnosis Date   Abnormal chest x-ray    Allergic rhinitis    Allergy    Ankylosing spondylitis ([place])    Anxiety    Arthritis    Colonic polyp    Coronary atherosclerosis of native coronary artery    Cough    Dizziness 10/21/2016   Ganglion cyst of wrist    left   Heart attack ([place]) 2008   Hemorrhoids    History of malaria    Hyperlipidemia    with low HDL   Hypertension    Kidney stone    Other disorder of muscle, ligament, and fascia    Rotator cuff tear    Swelling of lower extremity 10/16/2015   Past Surgical History:  Procedure Laterality Date   bicep rupture     CORONARY  ANGIOPLASTY WITH STENT PLACEMENT     HEMORRHOID SURGERY  05/2009   in [place]   LEFT HEART CATH AND CORONARY ANGIOGRAPHY N/A 01/14/2017   Procedure: Left Heart Cath and Coronary Angiography;  Surgeon: [name], MD;  Location: [place] CV [facility];  Service: Cardiovascular;  Laterality: N/A;   LEFT HEART CATHETERIZATION WITH CORONARY ANGIOGRAM N/A 09/03/2012   Procedure: LEFT HEART CATHETERIZATION WITH CORONARY ANGIOGRAM;  Surgeon: [name], MD;  Location[facility] CATH LAB;  Service: Cardiovascular;  Laterality: N/A;   PROSTATE BIOPSY     ROTATOR CUFF REPAIR     bilat by Dr. French Ana   Allergies  Allergen Reactions   Celebrex [Celecoxib]     Pt states precipitated his heart attack.   Tramadol Other (See Comments)    Makes patient jumpy  Prior to Admission medications   Medication Sig Start Date End Date Taking? Authorizing Provider  amLODipine (NORVASC) 5 MG tablet Take 1 tablet (5 mg total) by mouth daily. 03/13/21  Yes [name], MD  Apoaequorin (PREVAGEN PO) Take 1 tablet by mouth daily.    Yes [provider]  aspirin 81 MG EC tablet Take 81 mg by mouth daily.   Yes [provider]  chlorpheniramine (CHLOR-TRIMETON) 4 MG tablet 1 -2 every 4 hours as needed 08/14/20  Yes [name], MD  doxazosin (CARDURA) 1 MG tablet TAKE 1 TABLET EVERY DAY 09/13/21  Yes [name], MD  famotidine (PEPCID) 20 MG tablet Take 20 mg by mouth at bedtime.   Yes [provider]  Multiple Vitamins-Minerals (ICAPS AREDS 2) CAPS Take by mouth daily.   Yes [provider]  pantoprazole (PROTONIX) 40 MG tablet TAKE 1 TABLET 1 TIME DAILY 30 TO 60 MINUTES BEFORE FIRST MEAL OF THE DAY 11/23/21  Yes [name], MD  simvastatin (ZOCOR) 20 MG tablet TAKE 1 TABLET EVERY DAY WITH BREAKFAST (APPOINTMENT IS NEEDED FOR REFILLS) 12/19/21  Yes [name], MD  UNABLE TO FIND Med Name: CPAP per Dr Halford Chessman   Yes [provider]  albuterol (VENTOLIN HFA) 108 (90  Base) MCG/ACT inhaler Inhale into the lungs every 6 (six) hours as needed for wheezing or shortness of breath. Patient not taking: Reported on 12/04/2021    [provider]  ciclopirox (LOPROX) 0.77 % cream Apply topically 2 (two) times daily. Patient not taking: Reported on 02/07/2022    [provider]  Fluorouracil (TOLAK) 4 % CREA Apply topically. Patient not taking: Reported on 02/07/2022    [provider]  furosemide (LASIX) 20 MG tablet Take 1 tablet (20 mg total) by mouth daily. Patient not taking: Reported on 02/07/2022 12/04/21   Midge Minium, MD  triamcinolone cream (KENALOG) 0.1 % Use a thin application twice daily to affected areas of lower extremities, not to exceed 7 days of use Patient not taking: Reported on 12/19/2021 12/02/21   Chaney Malling, PA   Social History   Socioeconomic History   Marital status: Married    Spouse name: [name]   Number of children: 2   Years of education: Not on file   Highest education level: Not on file  Occupational History   Not on file  Tobacco Use   Smoking status: Never   Smokeless tobacco: Never  Vaping Use   Vaping Use: Never used  Substance and Sexual Activity   Alcohol use: Yes    Comment: holiday Rare    Drug use: No   Sexual activity: Not Currently  Other Topics Concern   Not on file  Social History Narrative   Not on file   Social Determinants of Health   Financial Resource Strain: Not on file  Food Insecurity: Not on file  Transportation Needs: Not on file  Physical Activity: Not on file  Stress: Not on file  Social Connections: Not on file  Intimate Partner Violence: Not on file    Review of Systems 13 point review of systems per patient health survey noted.  Negative other than as indicated above or in HPI.    Objective:   Vitals:   02/07/22 0902  BP: 110/60  Pulse: 78  Resp: 16  Temp: 98.1 F (36.7 C)  TempSrc: Temporal  SpO2: 95%  Weight: 165 lb 12.8 oz (75.2 kg)   Height: '5\' 7"'$  (  1.702 m)     Physical Exam Vitals reviewed.  Constitutional:      Appearance: He is well-developed.  HENT:     Head: Normocephalic and atraumatic.     Right Ear: External ear normal. There is no impacted cerumen.     Left Ear: External ear normal. There is no impacted cerumen.     Ears:     Comments: Excess cerumen bilaterally but no impaction appreciated.  Visualized portion of TM appears to be normal. Eyes:     Conjunctiva/sclera: Conjunctivae normal.     Pupils: Pupils are equal, round, and reactive to light.  Neck:     Thyroid: No thyromegaly.  Cardiovascular:     Rate and Rhythm: Normal rate and regular rhythm.     Heart sounds: Normal heart sounds.  Pulmonary:     Effort: Pulmonary effort is normal. No respiratory distress.     Breath sounds: Normal breath sounds. No wheezing.  Abdominal:     General: There is no distension.     Palpations: Abdomen is soft.     Tenderness: There is no abdominal tenderness.  Musculoskeletal:        General: No tenderness. Normal range of motion.     Cervical back: Normal range of motion and neck supple.  Lymphadenopathy:     Cervical: No cervical adenopathy.  Skin:    General: Skin is warm and dry.  Neurological:     Mental Status: He is alert and oriented to person, place, and time.     Deep Tendon Reflexes: Reflexes are normal and symmetric.  Psychiatric:        Behavior: Behavior normal.     MoCA score 27   Assessment & Plan:  [name] is a 81 y.o. male . Annual physical exam - Plan: Comprehensive metabolic panel, Lipid panel, Hemoglobin A1c  - -anticipatory guidance as below in AVS, screening labs above. Health maintenance items as above in HPI discussed/recommended as applicable.  Recommended contacting GI for colonoscopy, can discuss exercise with cardiology but start low, go slow principle discussed with walking.  Prostate cancer ([place]) - Plan: Comprehensive metabolic panel Abnormality of  urination - Plan: doxazosin (CARDURA) 1 MG tablet Nocturia - Plan: doxazosin (CARDURA) 1 MG tablet Dysuria - Plan: doxazosin (CARDURA) 1 MG tablet Prediabetes - Plan: Comprehensive metabolic panel, Hemoglobin A1c  -Prostate cancer followed by urology, chronic urinary issues, on doxazosin.  Recommend he discuss any medication changes with urology if incomplete treatment of symptoms.  Hyperlipidemia, unspecified hyperlipidemia type - Plan: Comprehensive metabolic panel, Lipid panel  -Check labs, adjust medication doses accordingly, continue simvastatin.  Memory difficulty - Plan: TSH, B12  -Overall reassuring MoCA score.  Immediate recall, short-term symptoms may be normal process of aging.  No concerning symptoms as above.  Check TSH, B12, follow-up to discuss symptoms further in 1 month.  Excessive cerumen in both ear canals  -No true impaction.  Option of Debrox with RTC precautions given.  Avoidance of cotton tip swabs.  No orders of the defined types were placed in this encounter.  Patient Instructions  Call your gastroenterologist to schedule colonoscopy, and follow up with other specialists as planned.  Call urologist to discuss urinary concerns, but I did refill doxazosin for now.  Shingrix vaccine at your pharmacy. Start low go slow with exercise, walking is ok, and discuss other exercise with your cardiologist.  Some earwax noted. If blocked, can try Debrox at home or follow up to have evaluated.  Avoid  using Q-tips within the ear canal. If any other new symptoms or concerns please schedule follow up. Take care!  I will check some labs including labs for memory, but I would like to discuss that further at follow-up in the next month.  If any acute changes or new/worsening symptoms in that time please follow-up sooner.   Earwax Buildup, Adult The ears produce a substance called earwax that helps keep bacteria out of the ear and protects the skin in the ear canal. Occasionally,  earwax can build up in the ear and cause discomfort or hearing loss. What are the causes? This condition is caused by a buildup of earwax. Ear canals are self-cleaning. Ear wax is made in the outer part of the ear canal and generally falls out in small amounts over time. When the self-cleaning mechanism is not working, earwax builds up and can cause decreased hearing and discomfort. Attempting to clean ears with cotton swabs can push the earwax deep into the ear canal and cause decreased hearing and pain. What increases the risk? This condition is more likely to develop in people who: Clean their ears often with cotton swabs. Pick at their ears. Use earplugs or in-ear headphones often, or wear hearing aids. The following factors may also make you more likely to develop this condition: Being male. Being of older age. Naturally producing more earwax. Having narrow ear canals. Having earwax that is overly thick or sticky. Having excess hair in the ear canal. Having eczema. Being dehydrated. What are the signs or symptoms? Symptoms of this condition include: Reduced or muffled hearing. A feeling of fullness in the ear or feeling that the ear is plugged. Fluid coming from the ear. Ear pain or an itchy ear. Ringing in the ear. Coughing. Balance problems. An obvious piece of earwax that can be seen inside the ear canal. How is this diagnosed? This condition may be diagnosed based on: Your symptoms. Your medical history. An ear exam. During the exam, your health care provider will look into your ear with an instrument called an otoscope. You may have tests, including a hearing test. How is this treated? This condition may be treated by: Using ear drops to soften the earwax. Having the earwax removed by a health care provider. The health care provider may: Flush the ear with water. Use an instrument that has a loop on the end (curette). Use a suction device. Having surgery to remove  the wax buildup. This may be done in severe cases. Follow these instructions at home:  Take over-the-counter and prescription medicines only as told by your health care provider. Do not put any objects, including cotton swabs, into your ear. You can clean the opening of your ear canal with a washcloth or facial tissue. Follow instructions from your health care provider about cleaning your ears. Do not overclean your ears. Drink enough fluid to keep your urine pale yellow. This will help to thin the earwax. Keep all follow-up visits as told. If earwax builds up in your ears often or if you use hearing aids, consider seeing your health care provider for routine, preventive ear cleanings. Ask your health care provider how often you should schedule your cleanings. If you have hearing aids, clean them according to instructions from the manufacturer and your health care provider. Contact a health care provider if: You have ear pain. You develop a fever. You have pus or other fluid coming from your ear. You have hearing loss. You have ringing in your  ears that does not go away. You feel like the room is spinning (vertigo). Your symptoms do not improve with treatment. Get help right away if: You have bleeding from the affected ear. You have severe ear pain. Summary Earwax can build up in the ear and cause discomfort or hearing loss. The most common symptoms of this condition include reduced or muffled hearing, a feeling of fullness in the ear, or feeling that the ear is plugged. This condition may be diagnosed based on your symptoms, your medical history, and an ear exam. This condition may be treated by using ear drops to soften the earwax or by having the earwax removed by a health care provider. Do not put any objects, including cotton swabs, into your ear. You can clean the opening of your ear canal with a washcloth or facial tissue. This information is not intended to replace advice given to  you by your health care provider. Make sure you discuss any questions you have with your health care provider. Document Revised: 11/16/2019 Document Reviewed: 11/16/2019 Elsevier Patient Education  [place] 65 Years and Older, Male Preventive care refers to lifestyle choices and visits with your health care provider that can promote health and wellness. Preventive care visits are also called wellness exams. What can I expect for my preventive care visit? Counseling During your preventive care visit, your health care provider may ask about your: Medical history, including: Past medical problems. Family medical history. History of falls. Current health, including: Emotional well-being. Home life and relationship well-being. Sexual activity. Memory and ability to understand (cognition). Lifestyle, including: Alcohol, nicotine or tobacco, and drug use. Access to firearms. Diet, exercise, and sleep habits. Work and work Statistician. Sunscreen use. Safety issues such as seatbelt and bike helmet use. Physical exam Your health care provider will check your: Height and weight. These may be used to calculate your BMI (body mass index). BMI is a measurement that tells if you are at a healthy weight. Waist circumference. This measures the distance around your waistline. This measurement also tells if you are at a healthy weight and may help predict your risk of certain diseases, such as type 2 diabetes and high blood pressure. Heart rate and blood pressure. Body temperature. Skin for abnormal spots. What immunizations do I need?  Vaccines are usually given at various ages, according to a schedule. Your health care provider will recommend vaccines for you based on your age, medical history, and lifestyle or other factors, such as travel or where you work. What tests do I need? Screening Your health care provider may recommend screening tests for certain conditions.  This may include: Lipid and cholesterol levels. Diabetes screening. This is done by checking your blood sugar (glucose) after you have not eaten for a while (fasting). Hepatitis C test. Hepatitis B test. HIV (human immunodeficiency virus) test. STI (sexually transmitted infection) testing, if you are at risk. Lung cancer screening. Colorectal cancer screening. Prostate cancer screening. Abdominal aortic aneurysm (AAA) screening. You may need this if you are a current or former smoker. Talk with your health care provider about your test results, treatment options, and if necessary, the need for more tests. Follow these instructions at home: Eating and drinking  Eat a diet that includes fresh fruits and vegetables, whole grains, lean protein, and low-fat dairy products. Limit your intake of foods with high amounts of sugar, saturated fats, and salt. Take vitamin and mineral supplements as recommended by your health care provider. Do  not drink alcohol if your health care provider tells you not to drink. If you drink alcohol: Limit how much you have to 0-2 drinks a day. Know how much alcohol is in your drink. In the [place] one drink equals one 12 oz bottle of beer (355 mL), one 5 oz glass of wine (148 mL), or one 1 oz glass of hard liquor (44 mL). Lifestyle Brush your teeth every morning and night with fluoride toothpaste. Floss one time each day. Exercise for at least 30 minutes 5 or more days each week. Do not use any products that contain nicotine or tobacco. These products include cigarettes, chewing tobacco, and vaping devices, such as e-cigarettes. If you need help quitting, ask your health care provider. Do not use drugs. If you are sexually active, practice safe sex. Use a condom or other form of protection to prevent STIs. Take aspirin only as told by your health care provider. Make sure that you understand how much to take and what form to take. Work with your health care provider to  find out whether it is safe and beneficial for you to take aspirin daily. Ask your health care provider if you need to take a cholesterol-lowering medicine (statin). Find healthy ways to manage stress, such as: Meditation, yoga, or listening to music. Journaling. Talking to a trusted person. Spending time with friends and family. Safety Always wear your seat belt while driving or riding in a vehicle. Do not drive: If you have been drinking alcohol. Do not ride with someone who has been drinking. When you are tired or distracted. While texting. If you have been using any mind-altering substances or drugs. Wear a helmet and other protective equipment during sports activities. If you have firearms in your house, make sure you follow all gun safety procedures. Minimize exposure to UV radiation to reduce your risk of skin cancer. What's next? Visit your health care provider once a year for an annual wellness visit. Ask your health care provider how often you should have your eyes and teeth checked. Stay up to date on all vaccines. This information is not intended to replace advice given to you by your health care provider. Make sure you discuss any questions you have with your health care provider. Document Revised: 01/24/2021 Document Reviewed: 01/24/2021 Elsevier Patient Education  [place],   Merri Ray, MD [place] 02/07/22 9:29 AM

## 2022-02-07 NOTE — Patient Instructions (Addendum)
Call your gastroenterologist to schedule colonoscopy, and follow up with other specialists as planned.  Call urologist to discuss urinary concerns, but I did refill doxazosin for now.  Shingrix vaccine at your pharmacy. Start low go slow with exercise, walking is ok, and discuss other exercise with your cardiologist.  Some earwax noted. If blocked, can try Debrox at home or follow up to have evaluated.  Avoid using Q-tips within the ear canal. If any other new symptoms or concerns please schedule follow up. Take care!  I will check some labs including labs for memory, but I would like to discuss that further at follow-up in the next month.  If any acute changes or new/worsening symptoms in that time please follow-up sooner.   Earwax Buildup, Adult The ears produce a substance called earwax that helps keep bacteria out of the ear and protects the skin in the ear canal. Occasionally, earwax can build up in the ear and cause discomfort or hearing loss. What are the causes? This condition is caused by a buildup of earwax. Ear canals are self-cleaning. Ear wax is made in the outer part of the ear canal and generally falls out in small amounts over time. When the self-cleaning mechanism is not working, earwax builds up and can cause decreased hearing and discomfort. Attempting to clean ears with cotton swabs can push the earwax deep into the ear canal and cause decreased hearing and pain. What increases the risk? This condition is more likely to develop in people who: Clean their ears often with cotton swabs. Pick at their ears. Use earplugs or in-ear headphones often, or wear hearing aids. The following factors may also make you more likely to develop this condition: Being male. Being of older age. Naturally producing more earwax. Having narrow ear canals. Having earwax that is overly thick or sticky. Having excess hair in the ear canal. Having eczema. Being dehydrated. What are the signs or  symptoms? Symptoms of this condition include: Reduced or muffled hearing. A feeling of fullness in the ear or feeling that the ear is plugged. Fluid coming from the ear. Ear pain or an itchy ear. Ringing in the ear. Coughing. Balance problems. An obvious piece of earwax that can be seen inside the ear canal. How is this diagnosed? This condition may be diagnosed based on: Your symptoms. Your medical history. An ear exam. During the exam, your health care provider will look into your ear with an instrument called an otoscope. You may have tests, including a hearing test. How is this treated? This condition may be treated by: Using ear drops to soften the earwax. Having the earwax removed by a health care provider. The health care provider may: Flush the ear with water. Use an instrument that has a loop on the end (curette). Use a suction device. Having surgery to remove the wax buildup. This may be done in severe cases. Follow these instructions at home:  Take over-the-counter and prescription medicines only as told by your health care provider. Do not put any objects, including cotton swabs, into your ear. You can clean the opening of your ear canal with a washcloth or facial tissue. Follow instructions from your health care provider about cleaning your ears. Do not overclean your ears. Drink enough fluid to keep your urine pale yellow. This will help to thin the earwax. Keep all follow-up visits as told. If earwax builds up in your ears often or if you use hearing aids, consider seeing your health care provider  for routine, preventive ear cleanings. Ask your health care provider how often you should schedule your cleanings. If you have hearing aids, clean them according to instructions from the manufacturer and your health care provider. Contact a health care provider if: You have ear pain. You develop a fever. You have pus or other fluid coming from your ear. You have hearing  loss. You have ringing in your ears that does not go away. You feel like the room is spinning (vertigo). Your symptoms do not improve with treatment. Get help right away if: You have bleeding from the affected ear. You have severe ear pain. Summary Earwax can build up in the ear and cause discomfort or hearing loss. The most common symptoms of this condition include reduced or muffled hearing, a feeling of fullness in the ear, or feeling that the ear is plugged. This condition may be diagnosed based on your symptoms, your medical history, and an ear exam. This condition may be treated by using ear drops to soften the earwax or by having the earwax removed by a health care provider. Do not put any objects, including cotton swabs, into your ear. You can clean the opening of your ear canal with a washcloth or facial tissue. This information is not intended to replace advice given to you by your health care provider. Make sure you discuss any questions you have with your health care provider. Document Revised: 11/16/2019 Document Reviewed: 11/16/2019 Elsevier Patient Education  [place] 65 Years and Older, Male Preventive care refers to lifestyle choices and visits with your health care provider that can promote health and wellness. Preventive care visits are also called wellness exams. What can I expect for my preventive care visit? Counseling During your preventive care visit, your health care provider may ask about your: Medical history, including: Past medical problems. Family medical history. History of falls. Current health, including: Emotional well-being. Home life and relationship well-being. Sexual activity. Memory and ability to understand (cognition). Lifestyle, including: Alcohol, nicotine or tobacco, and drug use. Access to firearms. Diet, exercise, and sleep habits. Work and work Statistician. Sunscreen use. Safety issues such as seatbelt and  bike helmet use. Physical exam Your health care provider will check your: Height and weight. These may be used to calculate your BMI (body mass index). BMI is a measurement that tells if you are at a healthy weight. Waist circumference. This measures the distance around your waistline. This measurement also tells if you are at a healthy weight and may help predict your risk of certain diseases, such as type 2 diabetes and high blood pressure. Heart rate and blood pressure. Body temperature. Skin for abnormal spots. What immunizations do I need?  Vaccines are usually given at various ages, according to a schedule. Your health care provider will recommend vaccines for you based on your age, medical history, and lifestyle or other factors, such as travel or where you work. What tests do I need? Screening Your health care provider may recommend screening tests for certain conditions. This may include: Lipid and cholesterol levels. Diabetes screening. This is done by checking your blood sugar (glucose) after you have not eaten for a while (fasting). Hepatitis C test. Hepatitis B test. HIV (human immunodeficiency virus) test. STI (sexually transmitted infection) testing, if you are at risk. Lung cancer screening. Colorectal cancer screening. Prostate cancer screening. Abdominal aortic aneurysm (AAA) screening. You may need this if you are a current or former smoker. Talk with your health care  provider about your test results, treatment options, and if necessary, the need for more tests. Follow these instructions at home: Eating and drinking  Eat a diet that includes fresh fruits and vegetables, whole grains, lean protein, and low-fat dairy products. Limit your intake of foods with high amounts of sugar, saturated fats, and salt. Take vitamin and mineral supplements as recommended by your health care provider. Do not drink alcohol if your health care provider tells you not to drink. If you  drink alcohol: Limit how much you have to 0-2 drinks a day. Know how much alcohol is in your drink. In the [place] one drink equals one 12 oz bottle of beer (355 mL), one 5 oz glass of wine (148 mL), or one 1 oz glass of hard liquor (44 mL). Lifestyle Brush your teeth every morning and night with fluoride toothpaste. Floss one time each day. Exercise for at least 30 minutes 5 or more days each week. Do not use any products that contain nicotine or tobacco. These products include cigarettes, chewing tobacco, and vaping devices, such as e-cigarettes. If you need help quitting, ask your health care provider. Do not use drugs. If you are sexually active, practice safe sex. Use a condom or other form of protection to prevent STIs. Take aspirin only as told by your health care provider. Make sure that you understand how much to take and what form to take. Work with your health care provider to find out whether it is safe and beneficial for you to take aspirin daily. Ask your health care provider if you need to take a cholesterol-lowering medicine (statin). Find healthy ways to manage stress, such as: Meditation, yoga, or listening to music. Journaling. Talking to a trusted person. Spending time with friends and family. Safety Always wear your seat belt while driving or riding in a vehicle. Do not drive: If you have been drinking alcohol. Do not ride with someone who has been drinking. When you are tired or distracted. While texting. If you have been using any mind-altering substances or drugs. Wear a helmet and other protective equipment during sports activities. If you have firearms in your house, make sure you follow all gun safety procedures. Minimize exposure to UV radiation to reduce your risk of skin cancer. What's next? Visit your health care provider once a year for an annual wellness visit. Ask your health care provider how often you should have your eyes and teeth checked. Stay up to  date on all vaccines. This information is not intended to replace advice given to you by your health care provider. Make sure you discuss any questions you have with your health care provider. Document Revised: 01/24/2021 Document Reviewed: 01/24/2021 Elsevier Patient Education  [place].

## 2022-02-14 ENCOUNTER — Encounter: Payer: Self-pay | Admitting: Family Medicine

## 2022-02-20 DIAGNOSIS — K644 Residual hemorrhoidal skin tags: Secondary | ICD-10-CM | POA: Diagnosis not present

## 2022-02-21 ENCOUNTER — Telehealth: Payer: Self-pay

## 2022-02-21 NOTE — Telephone Encounter (Signed)
   Pre-operative Risk Assessment    Patient Name: [name]  DOB: October 25, 1940 MRN: [id number]      Request for Surgical Clearance    Procedure:   Hemorrhoidectomy  Date of Surgery:  Clearance TBD                                 Surgeon:  Nadeen Landau, MD Surgeon's Group or Practice Name:  [facility] Phone number:  [phone] Fax number:  [phone]   Type of Clearance Requested:   - Medical  - Pharmacy:  Hold Aspirin     Type of Anesthesia:  General    Additional requests/questions:   None  Signed, Sumedha Munnerlyn   02/21/2022, 8:29 AM

## 2022-02-22 NOTE — Telephone Encounter (Signed)
   Name: [name]  DOB: 19-Apr-1941  MRN: [id number]  Primary Cardiologist: [name], MD  Chart reviewed as part of pre-operative protocol coverage. Because of [name] past medical history and time since last visit, he will require a follow-up in-office visit in order to better assess preoperative cardiovascular risk.  Pre-op covering staff: - Please schedule appointment and call patient to inform them. If patient already had an upcoming appointment within acceptable timeframe, please add "pre-op clearance" to the appointment notes so provider is aware. - Please contact requesting surgeon's office via preferred method (i.e, phone, fax) to inform them of need for appointment prior to surgery.  This message will also be routed to Dr Burt Knack for input on holding ASA due to LAD stenting as requested below so that this information is available to the clearing provider at time of patient's appointment.   Elgie Collard, PA-C  02/22/2022, 12:10 PM

## 2022-02-22 NOTE — Telephone Encounter (Signed)
Pt has been scheduled to see Dr. Burt Knack, 05/27/22, clearance will be addressed at that time. Per pt, he wouldn't have the surgery until November, due to spouse serious medical conditions.  Will route to the requesting surgeon's office to make them aware.

## 2022-02-25 ENCOUNTER — Other Ambulatory Visit: Payer: Self-pay

## 2022-02-25 ENCOUNTER — Encounter: Payer: Self-pay | Admitting: Family Medicine

## 2022-02-25 ENCOUNTER — Telehealth: Payer: Self-pay | Admitting: Family Medicine

## 2022-02-25 ENCOUNTER — Ambulatory Visit (INDEPENDENT_AMBULATORY_CARE_PROVIDER_SITE_OTHER): Payer: Medicare HMO | Admitting: Family Medicine

## 2022-02-25 VITALS — BP 134/60 | HR 76 | Temp 98.0°F | Resp 18 | Ht 67.0 in | Wt 163.4 lb

## 2022-02-25 DIAGNOSIS — U071 COVID-19: Secondary | ICD-10-CM

## 2022-02-25 DIAGNOSIS — J849 Interstitial pulmonary disease, unspecified: Secondary | ICD-10-CM

## 2022-02-25 DIAGNOSIS — J449 Chronic obstructive pulmonary disease, unspecified: Secondary | ICD-10-CM | POA: Diagnosis not present

## 2022-02-25 DIAGNOSIS — I25119 Atherosclerotic heart disease of native coronary artery with unspecified angina pectoris: Secondary | ICD-10-CM | POA: Diagnosis not present

## 2022-02-25 DIAGNOSIS — R110 Nausea: Secondary | ICD-10-CM

## 2022-02-25 MED ORDER — PREDNISONE 20 MG PO TABS: 40.0000 mg | ORAL_TABLET | Freq: Every day | ORAL | 0 refills | Status: DC

## 2022-02-25 MED ORDER — ONDANSETRON 4 MG PO TBDP: 4.0000 mg | ORAL_TABLET | Freq: Three times a day (TID) | ORAL | 0 refills | Status: DC | PRN

## 2022-02-25 MED ORDER — NIRMATRELVIR/RITONAVIR (PAXLOVID)TABLET: 3.0000 | ORAL_TABLET | Freq: Two times a day (BID) | ORAL | 0 refills | Status: AC

## 2022-02-25 MED ORDER — BENZONATATE 100 MG PO CAPS: 100.0000 mg | ORAL_CAPSULE | Freq: Three times a day (TID) | ORAL | 0 refills | Status: DC | PRN

## 2022-02-25 MED ORDER — AZITHROMYCIN 250 MG PO TABS: ORAL_TABLET | ORAL | 0 refills | Status: AC

## 2022-02-25 NOTE — Telephone Encounter (Signed)
Pt had a appt with Dr. Carlota Raspberry today. Pt called stating that one of  his medication (Tessalon) wasn't sent in today. Pt pharmacy is [facility] on [address].

## 2022-02-25 NOTE — Patient Instructions (Addendum)
Start paxlovid tonight after 7:30. Stop simvastatin for 10 days. Decrease amlodipine to 1/2 pill per day for next week unless blood pressure increasing, then remain on full pill.   Tylenol for fever and body aches, mucinex for cough, tessalon also prescribed for cough if needed. Zofran if needed for nausea.take symbicort 2 puffs twice per day for now.  I will also prescribe antibiotic and short course of prednisone based on your lung history and exam today.    If any chest pain, shortness of breath at rest or with minimal activity, confusion, or inability to drink fluids,  or other worsening - be seen in emergency room.  Hang in there. I am hoping that your symptoms will start to improve in the next day or two.    Everyone who has presumed or confirmed COVID-19 should stay home and isolate from other people for at least 5 full days (day 0 is the first day of symptoms or the date of the day of the positive viral test for asymptomatic persons). You can end isolation after 5 full days if you are fever-free for 24 hours without the use of fever-reducing medication and your other symptoms have improved (Loss of taste and smell may persist for weeks or months after recovery and need not delay the end of isolation). You should continue to wear a well-fitting mask around others at home and in public for 5 additional days (day 6 through day 10) after the end of your 5-day isolation period. If you are unable to wear a mask when around others, you should continue to isolate for a full 10 days. Avoid people who have weakened immune systems or are more likely to get very sick from COVID-19, and nursing homes and other high-risk settings, until after at least 10 days.  If you continue to have fever or your other symptoms have not improved after 5 days of isolation, you should wait to end your isolation until you are fever-free for 24 hours without the use of fever-reducing medication and your other symptoms have  improved. Continue to wear a well-fitting mask through day 10.   [email or web address]   If you have lab work done today you will be contacted with your lab results within the next 2 weeks.  If you have not heard from [place] then please contact us. The fastest way to get your results is to register for My Chart.   IF you received an x-ray today, you will receive an invoice from [facility]. Please contact [facility] at [phone] with questions or concerns regarding your invoice.   IF you received labwork today, you will receive an invoice from [facility]. Please contact [facility] at [phone] with questions or concerns regarding your invoice.   Our billing staff will not be able to assist you with questions regarding bills from these companies.  You will be contacted with the lab results as soon as they are available. The fastest way to get your results is to activate your My Chart account. Instructions are located on the last page of this paperwork. If you have not heard from [place] regarding the results in 2 weeks, please contact this office.

## 2022-02-25 NOTE — Progress Notes (Signed)
Subjective:  Patient ID: [name], male    DOB: Oct 14, 1940  Age: 81 y.o. MRN: [id number]  CC:  Chief Complaint  Patient presents with   Covid Positive    Patient states he tested positive for covid yesterday. Patient states he has been having a headaches, SOB, fatigue, congestion, vomiting and no appetite. He has took cough  and tylenol medications for symptoms.    HPI [name] presents for   COVID-19 infection Day 2,  initial symptoms started 2 days ago, gen fatigue, weak. Felt worse yesterday.  Positive testing yesterday. Current symptoms as above with headache, fatigue, cough with shortness of breath (with cough fit, not at rest, walking). congestion, nausea, vomiting once. decreased appetite but drinking fluids ok. Isolating.  No confusion/disorientation No chest pain.  Some wheezing yesterday, cough productive with yellow phlegm. Used symbicort a few times last week.   Attempted treatments Tylenol, over-the-counter cough medication.   COVID-19 vaccine in 2021 with booster in December 2021. No recent booster.  History of CAD, upper airway cough syndrome versus cough variant asthma, interstitial lung disease, prostate cancer, idiopathic thrombocytopenia.  Medications reviewed in regards to use of antiviral.  He is on statin with simvastatin 20 mg daily (took at 7:20am) and CCB with amlodipine.  GFR 78.56 on 02/07/2022.    History Patient Active Problem List   Diagnosis Date Noted   Hypertrophy of prostate without urinary obstruction and other lower urinary tract symptoms (LUTS) 12/04/2021   Prostate cancer ([place]) 05/29/2021   GERD (gastroesophageal reflux disease) 12/15/2017   Unstable angina ([place]) 12/05/2017   Angina at rest [facility]) 12/05/2017   Atypical chest pain    Dyslipidemia    COPD not affecting current episode of care [facility])    Essential hypertension 05/29/2017   Abnormal nuclear cardiac imaging test 01/14/2017   Bronchiectasis without complication  ([place]) 02/09/1600   ILD (interstitial lung disease) ([place]) 11/27/2016   Dizziness 10/21/2016   Cough variant asthma  vs UACS 07/22/2016   Obstructive sleep apnea 12/01/2015   Acute bronchitis 11/30/2015   Swelling of lower extremity 10/16/2015   Benign mole 02/04/2014   Impaired glucose tolerance 10/26/2013   NAFLD (nonalcoholic fatty liver disease) 04/27/2013   Thrombocytopenia ([place]) 10/21/2012   Exertional angina ([place]) 09/03/2012   MALARIA 12/05/2009   COLONIC POLYPS 12/05/2009   HYPERCHOLESTEROLEMIA 12/05/2009   ANXIETY 12/05/2009   HEMORRHOIDS 12/05/2009   Allergic rhinitis 12/05/2009   GANGLION CYST, WRIST, LEFT 12/05/2009   ROTATOR CUFF TEAR 12/05/2009   OTHER DISORDER OF MUSCLE LIGAMENT AND FASCIA 12/05/2009   ABNORMAL CHEST XRAY 12/05/2009   CAD (coronary artery disease) 04/05/2009   Past Medical History:  Diagnosis Date   Abnormal chest x-ray    Allergic rhinitis    Allergy    Ankylosing spondylitis ([place])    Anxiety    Arthritis    Colonic polyp    Coronary atherosclerosis of native coronary artery    Cough    Dizziness 10/21/2016   Ganglion cyst of wrist    left   Heart attack ([place]) 2008   Hemorrhoids    History of malaria    Hyperlipidemia    with low HDL   Hypertension    Kidney stone    Other disorder of muscle, ligament, and fascia    Rotator cuff tear    Swelling of lower extremity 10/16/2015   Past Surgical History:  Procedure Laterality Date   bicep rupture     CORONARY ANGIOPLASTY WITH STENT  PLACEMENT     HEMORRHOID SURGERY  05/2009   in [facility]   LEFT HEART CATH AND CORONARY ANGIOGRAPHY N/A 01/14/2017   Procedure: Left Heart Cath and Coronary Angiography;  Surgeon: [name], MD;  Location: [place] CV LAB;  Service: Cardiovascular;  Laterality: N/A;   LEFT HEART CATHETERIZATION WITH CORONARY ANGIOGRAM N/A 09/03/2012   Procedure: LEFT HEART CATHETERIZATION WITH CORONARY ANGIOGRAM;  Surgeon: [name], MD;  Location: [facility] CATH LAB;   Service: Cardiovascular;  Laterality: N/A;   PROSTATE BIOPSY     ROTATOR CUFF REPAIR     bilat by Dr. French Ana   Allergies  Allergen Reactions   Celebrex [Celecoxib]     Pt states precipitated his heart attack.   Tramadol Other (See Comments)    Makes patient jumpy   Prior to Admission medications   Medication Sig Start Date End Date Taking? Authorizing Provider  amLODipine (NORVASC) 5 MG tablet Take 1 tablet (5 mg total) by mouth daily. 03/13/21  Yes [name], MD  Apoaequorin (PREVAGEN PO) Take 1 tablet by mouth daily.    Yes [provider]  aspirin 81 MG EC tablet Take 81 mg by mouth daily.   Yes [provider]  chlorpheniramine (CHLOR-TRIMETON) 4 MG tablet 1 -2 every 4 hours as needed 08/14/20  Yes [name], MD  doxazosin (CARDURA) 1 MG tablet Take 1 tablet (1 mg total) by mouth daily. 02/07/22  Yes [name], MD  famotidine (PEPCID) 20 MG tablet Take 20 mg by mouth at bedtime.   Yes [provider]  Multiple Vitamins-Minerals (ICAPS AREDS 2) CAPS Take by mouth daily.   Yes [provider]  pantoprazole (PROTONIX) 40 MG tablet TAKE 1 TABLET 1 TIME DAILY 30 TO 60 MINUTES BEFORE FIRST MEAL OF THE DAY 11/23/21  Yes [name], MD  simvastatin (ZOCOR) 20 MG tablet TAKE 1 TABLET EVERY DAY WITH BREAKFAST (APPOINTMENT IS NEEDED FOR REFILLS) 12/19/21  Yes [name], MD  albuterol (VENTOLIN HFA) 108 (90 Base) MCG/ACT inhaler Inhale into the lungs every 6 (six) hours as needed for wheezing or shortness of breath. Patient not taking: Reported on 12/04/2021    [provider]  famotidine (PEPCID) 20 MG tablet Take by mouth.    [provider]  UNABLE TO FIND Med Name: CPAP per Dr Halford Chessman Patient not taking: Reported on 02/25/2022    [provider]   Social History   Socioeconomic History   Marital status: Married    Spouse name: [name]   Number of children: 2   Years of education: Not on file   Highest  education level: Not on file  Occupational History   Not on file  Tobacco Use   Smoking status: Never   Smokeless tobacco: Never  Vaping Use   Vaping Use: Never used  Substance and Sexual Activity   Alcohol use: Yes    Comment: holiday Rare    Drug use: No   Sexual activity: Not Currently  Other Topics Concern   Not on file  Social History Narrative   Not on file   Social Determinants of Health   Financial Resource Strain: Not on file  Food Insecurity: Not on file  Transportation Needs: Not on file  Physical Activity: Not on file  Stress: Not on file  Social Connections: Not on file  Intimate Partner Violence: Not on file    Review of Systems  Per HPI.  Objective:   Vitals:   02/25/22 0818  BP: 134/60  Pulse: 76  Resp: 18  Temp: 98 F (36.7 C)  TempSrc: Temporal  SpO2: 93%  Weight: 163 lb 6.4 oz (74.1 kg)  Height: '5\' 7"'$  (1.702 m)   O2 sat 95% at his last visit on June 29th.   Physical Exam Vitals reviewed.  Constitutional:      Appearance: He is well-developed.  HENT:     Head: Normocephalic and atraumatic.     Right Ear: Tympanic membrane, ear canal and external ear normal.     Left Ear: Tympanic membrane, ear canal and external ear normal.     Nose: No rhinorrhea.     Mouth/Throat:     Pharynx: No oropharyngeal exudate or posterior oropharyngeal erythema.  Eyes:     Conjunctiva/sclera: Conjunctivae normal.     Pupils: Pupils are equal, round, and reactive to light.  Cardiovascular:     Rate and Rhythm: Normal rate and regular rhythm.     Heart sounds: Normal heart sounds. No murmur heard. Pulmonary:     Effort: Pulmonary effort is normal. No respiratory distress (spealing in full sentences.).     Breath sounds: Wheezing (failt wheeze, scattered rhonchi inferiorly. normal effort, no distress.) and rhonchi present. No rales.  Abdominal:     Palpations: Abdomen is soft.     Tenderness: There is no abdominal tenderness.  Musculoskeletal:      Cervical back: Neck supple.  Lymphadenopathy:     Cervical: No cervical adenopathy.  Skin:    General: Skin is warm and dry.     Findings: No rash.  Neurological:     Mental Status: He is alert and oriented to person, place, and time.  Psychiatric:        Behavior: Behavior normal.     50 minutes spent during visit, including chart review, exam and discussion of medications with COVID infection as well as his underlying ILD,  counseling and assimilation of information, exam, discussion of plan, and chart completion.      . Assessment & Plan:  [name] is a 81 y.o. male . COVID-19 virus infection - Plan: nirmatrelvir/ritonavir EUA (PAXLOVID) 20 x 150 MG & 10 x '100MG'$  TABS  ILD (interstitial lung disease) (HCC) - Plan: nirmatrelvir/ritonavir EUA (PAXLOVID) 20 x 150 MG & 10 x '100MG'$  TABS, azithromycin (ZITHROMAX) 250 MG tablet, predniSONE (DELTASONE) 20 MG tablet  Coronary artery disease involving native coronary artery of native heart with angina pectoris (HCC) - Plan: nirmatrelvir/ritonavir EUA (PAXLOVID) 20 x 150 MG & 10 x '100MG'$  TABS  Nausea - Plan: ondansetron (ZOFRAN-ODT) 4 MG disintegrating tablet  COVID-19 infection with underlying ILD, CAD as above.  Some rhonchi, faint wheeze noted on exam.  O2 sat 93%.  Appears to be stable at this time for outpatient treatment, but will start antiviral.  Potential risks, side effects and risk of rebound COVID discussed.  Also with ILD and lung exam, discolored phlegm we will start azithromycin, prednisone.  Potential side effects and risk discussed.  Zofran if needed for nausea.  Isolation, masking precautions discussed.  ER precautions given if any worsening symptoms with understanding expressed.  Recheck 2 days with virtual visit. We will need to stop simvastatin for 10 days, potentially may need to decrease dose of the amlodipine temporarily, instructions given on after visit summary.  Meds ordered this encounter  Medications    ondansetron (ZOFRAN-ODT) 4 MG disintegrating tablet    Sig: Take 1 tablet (4 mg total) by mouth every 8 (eight) hours as needed  for nausea or vomiting.    Dispense:  10 tablet    Refill:  0   nirmatrelvir/ritonavir EUA (PAXLOVID) 20 x 150 MG & 10 x '100MG'$  TABS    Sig: Take 3 tablets by mouth 2 (two) times daily for 5 days. (Take nirmatrelvir 150 mg two tablets twice daily for 5 days and ritonavir 100 mg one tablet twice daily for 5 days) Patient GFR is 78    Dispense:  30 tablet    Refill:  0   azithromycin (ZITHROMAX) 250 MG tablet    Sig: Take 2 tablets on day 1, then 1 tablet daily on days 2 through 5    Dispense:  6 tablet    Refill:  0   predniSONE (DELTASONE) 20 MG tablet    Sig: Take 2 tablets (40 mg total) by mouth daily with breakfast.    Dispense:  6 tablet    Refill:  0   Patient Instructions  Start paxlovid tonight after 7:30. Stop simvastatin for 10 days. Decrease amlodipine to 1/2 pill per day for next week unless blood pressure increasing, then remain on full pill.   Tylenol for fever and body aches, mucinex for cough, tessalon also prescribed for cough if needed. Zofran if needed for nausea.take symbicort 2 puffs twice per day for now.  I will also prescribe antibiotic and short course of prednisone based on your lung history and exam today.    If any chest pain, shortness of breath at rest or with minimal activity, confusion, or inability to drink fluids,  or other worsening - be seen in emergency room.  Hang in there. I am hoping that your symptoms will start to improve in the next day or two.    Everyone who has presumed or confirmed COVID-19 should stay home and isolate from other people for at least 5 full days (day 0 is the first day of symptoms or the date of the day of the positive viral test for asymptomatic persons). You can end isolation after 5 full days if you are fever-free for 24 hours without the use of fever-reducing medication and your other symptoms have  improved (Loss of taste and smell may persist for weeks or months after recovery and need not delay the end of isolation). You should continue to wear a well-fitting mask around others at home and in public for 5 additional days (day 6 through day 10) after the end of your 5-day isolation period. If you are unable to wear a mask when around others, you should continue to isolate for a full 10 days. Avoid people who have weakened immune systems or are more likely to get very sick from COVID-19, and nursing homes and other high-risk settings, until after at least 10 days.  If you continue to have fever or your other symptoms have not improved after 5 days of isolation, you should wait to end your isolation until you are fever-free for 24 hours without the use of fever-reducing medication and your other symptoms have improved. Continue to wear a well-fitting mask through day 10.   [email or web address]   If you have lab work done today you will be contacted with your lab results within the next 2 weeks.  If you have not heard from [place] then please contact us. The fastest way to get your results is to register for My Chart.   IF you received an x-ray today, you will receive an invoice from [facility]. Please contact [facility] at [phone]  with questions or concerns regarding your invoice.   IF you received labwork today, you will receive an invoice from [facility]. Please contact [facility] at [phone] with questions or concerns regarding your invoice.   Our billing staff will not be able to assist you with questions regarding bills from these companies.  You will be contacted with the lab results as soon as they are available. The fastest way to get your results is to activate your My Chart account. Instructions are located on the last page of this paperwork. If you have not heard from [place] regarding the results in 2 weeks,  please contact this office.        Signed,   Merri Ray, MD [place], [facility] 02/25/22 9:26 AM

## 2022-02-27 ENCOUNTER — Telehealth (INDEPENDENT_AMBULATORY_CARE_PROVIDER_SITE_OTHER): Payer: Medicare HMO | Admitting: Family Medicine

## 2022-02-27 ENCOUNTER — Encounter: Payer: Self-pay | Admitting: Family Medicine

## 2022-02-27 VITALS — BP 154/64

## 2022-02-27 DIAGNOSIS — U071 COVID-19: Secondary | ICD-10-CM | POA: Diagnosis not present

## 2022-02-27 DIAGNOSIS — J849 Interstitial pulmonary disease, unspecified: Secondary | ICD-10-CM | POA: Diagnosis not present

## 2022-02-27 NOTE — Patient Instructions (Signed)
Glad to hear that you are improving.  Continue the antibiotic until completed as well as antiviral.  Continue Mucinex for cough, Tessalon Perles as needed.  Continue to drink plenty of fluids and rest.  I suspect the fatigue will improve as her other symptoms improved but that may take a week or 2.  If any increasing shortness of breath, fevers, chest pain, or other worsening symptoms be seen right away as we discussed.  Hang in there and let me know if there are questions.

## 2022-02-27 NOTE — Progress Notes (Signed)
Virtual Visit via Video Note  I connected with [name] on 02/27/22 at 11:27 AM by a video enabled telemedicine application and verified that I am speaking with the correct person using two identifiers.  Patient location:home, by self.  My location: office - [place].    I discussed the limitations, risks, security and privacy concerns of performing an evaluation and management service by telephone and the availability of in person appointments. I also discussed with the patient that there may be a patient responsible charge related to this service. The patient expressed understanding and agreed to proceed, consent obtained  Chief complaint:  Chief Complaint  Patient presents with   Covid Positive    Doing pt reports doing much better, no new sxs, notes continued chest congestion, still has a productive cough, headache is gone, chills with sweats, experiencing weakness     History of Present Illness: [name] is a 81 y.o. male  COVID-19 infection with underlying interstitial lung disease.  Initial symptoms July 15. Follow-up from office visit 2 days ago.  Some wheezing reported at that time and some diffuse coarse breath sounds, rhonchi on exam.  With history of asthma, interstitial lung disease, started on Paxlovid, azithromycin, prednisone and Mucinex, Tessalon Perles for symptomatic care.  Feeling much better. Had some sweats, better after sweating. No measured fevers. O2sat 95% this morning. Not feeling dyspnea.  Drinking fluids ok, no further nausea - not needing zofran rx from last visit.  No chest pains.  Has been taking antiviral and azithromycin without new side effects.  Some chest congestion, taking mucinex. Tessalon perles has been helping some. Minimal cough. More cough with lying down.  Still some fatigue.        Patient Active Problem List   Diagnosis Date Noted   Hypertrophy of prostate without urinary obstruction and other lower urinary  tract symptoms (LUTS) 12/04/2021   Prostate cancer ([place]) 05/29/2021   GERD (gastroesophageal reflux disease) 12/15/2017   Unstable angina ([place]) 12/05/2017   Angina at rest [facility]) 12/05/2017   Atypical chest pain    Dyslipidemia    COPD not affecting current episode of care [facility])    Essential hypertension 05/29/2017   Abnormal nuclear cardiac imaging test 01/14/2017   Bronchiectasis without complication ([place]) 70/26/3785   ILD (interstitial lung disease) ([place]) 11/27/2016   Dizziness 10/21/2016   Cough variant asthma  vs UACS 07/22/2016   Obstructive sleep apnea 12/01/2015   Acute bronchitis 11/30/2015   Swelling of lower extremity 10/16/2015   Benign mole 02/04/2014   Impaired glucose tolerance 10/26/2013   NAFLD (nonalcoholic fatty liver disease) 04/27/2013   Thrombocytopenia ([place]) 10/21/2012   Exertional angina ([place]) 09/03/2012   MALARIA 12/05/2009   COLONIC POLYPS 12/05/2009   HYPERCHOLESTEROLEMIA 12/05/2009   ANXIETY 12/05/2009   HEMORRHOIDS 12/05/2009   Allergic rhinitis 12/05/2009   GANGLION CYST, WRIST, LEFT 12/05/2009   ROTATOR CUFF TEAR 12/05/2009   OTHER DISORDER OF MUSCLE LIGAMENT AND FASCIA 12/05/2009   ABNORMAL CHEST XRAY 12/05/2009   CAD (coronary artery disease) 04/05/2009   Past Medical History:  Diagnosis Date   Abnormal chest x-ray    Allergic rhinitis    Allergy    Ankylosing spondylitis ([place])    Anxiety    Arthritis    Colonic polyp    Coronary atherosclerosis of native coronary artery    Cough    Dizziness 10/21/2016   Ganglion cyst of wrist    left   Heart attack ([place]) 2008  Hemorrhoids    History of malaria    Hyperlipidemia    with low HDL   Hypertension    Kidney stone    Other disorder of muscle, ligament, and fascia    Rotator cuff tear    Swelling of lower extremity 10/16/2015   Past Surgical History:  Procedure Laterality Date   bicep rupture     CORONARY ANGIOPLASTY WITH STENT PLACEMENT     HEMORRHOID SURGERY  05/2009   in [place]    LEFT HEART CATH AND CORONARY ANGIOGRAPHY N/A 01/14/2017   Procedure: Left Heart Cath and Coronary Angiography;  Surgeon: [name], MD;  Location: [place] CV LAB;  Service: Cardiovascular;  Laterality: N/A;   LEFT HEART CATHETERIZATION WITH CORONARY ANGIOGRAM N/A 09/03/2012   Procedure: LEFT HEART CATHETERIZATION WITH CORONARY ANGIOGRAM;  Surgeon: [name], MD;  Location[facility] C[facility];  Service: Cardiovascular;  Laterality: N/A;   PROSTATE BIOPSY     ROTATOR CUFF REPAIR     bilat by Dr. French Ana   Allergies  Allergen Reactions   Celebrex [Celecoxib]     Pt states precipitated his heart attack.   Tramadol Other (See Comments)    Makes patient jumpy   Prior to Admission medications   Medication Sig Start Date End Date Taking? Authorizing Provider  amLODipine (NORVASC) 5 MG tablet Take 1 tablet (5 mg total) by mouth daily. 03/13/21  Yes [name], MD  Apoaequorin (PREVAGEN PO) Take 1 tablet by mouth daily.    Yes [provider]  aspirin 81 MG EC tablet Take 81 mg by mouth daily.   Yes [provider]  azithromycin (ZITHROMAX) 250 MG tablet Take 2 tablets on day 1, then 1 tablet daily on days 2 through 5 02/25/22 03/02/22 Yes [name], MD  benzonatate (TESSALON) 100 MG capsule Take 1 capsule (100 mg total) by mouth 3 (three) times daily as needed for cough. 02/25/22  Yes [name], MD  chlorpheniramine (CHLOR-TRIMETON) 4 MG tablet 1 -2 every 4 hours as needed 08/14/20  Yes [name], MD  doxazosin (CARDURA) 1 MG tablet Take 1 tablet (1 mg total) by mouth daily. 02/07/22  Yes [name], MD  famotidine (PEPCID) 20 MG tablet Take 20 mg by mouth at bedtime.   Yes [provider]  famotidine (PEPCID) 20 MG tablet Take by mouth.   Yes [provider]  Multiple Vitamins-Minerals (ICAPS AREDS 2) CAPS Take by mouth daily.   Yes [provider]  nirmatrelvir/ritonavir EUA (PAXLOVID) 20 x 150 MG & 10 x '100MG'$  TABS  Take 3 tablets by mouth 2 (two) times daily for 5 days. (Take nirmatrelvir 150 mg two tablets twice daily for 5 days and ritonavir 100 mg one tablet twice daily for 5 days) Patient GFR is 78 02/25/22 03/02/22 Yes [name], MD  ondansetron (ZOFRAN-ODT) 4 MG disintegrating tablet Take 1 tablet (4 mg total) by mouth every 8 (eight) hours as needed for nausea or vomiting. 02/25/22  Yes [name], MD  pantoprazole (PROTONIX) 40 MG tablet TAKE 1 TABLET 1 TIME DAILY 30 TO 60 MINUTES BEFORE FIRST MEAL OF THE DAY 11/23/21  Yes [name], MD  predniSONE (DELTASONE) 20 MG tablet Take 2 tablets (40 mg total) by mouth daily with breakfast. 02/25/22  Yes [name], MD  simvastatin (ZOCOR) 20 MG tablet TAKE 1 TABLET EVERY DAY WITH BREAKFAST (APPOINTMENT IS NEEDED FOR REFILLS) 12/19/21  Yes [name], MD  albuterol (VENTOLIN  HFA) 108 (90 Base) MCG/ACT inhaler Inhale into the lungs every 6 (six) hours as needed for wheezing or shortness of breath. Patient not taking: Reported on 12/04/2021    [provider]  [place] Name: CPAP per Dr Halford Chessman Patient not taking: Reported on 02/25/2022    [provider]   Social History   Socioeconomic History   Marital status: Married    Spouse name: [name]   Number of children: 2   Years of education: Not on file   Highest education level: Not on file  Occupational History   Not on file  Tobacco Use   Smoking status: Never   Smokeless tobacco: Never  Vaping Use   Vaping Use: Never used  Substance and Sexual Activity   Alcohol use: Yes    Comment: holiday Rare    Drug use: No   Sexual activity: Not Currently  Other Topics Concern   Not on file  Social History Narrative   Not on file   Social Determinants of Health   Financial Resource Strain: Not on file  Food Insecurity: Not on file  Transportation Needs: Not on file  Physical Activity: Not on file  Stress: Not on file  Social Connections: Not on file   Intimate Partner Violence: Not on file    Observations/Objective: O2sat 95% RA, BP 129/65, HR 67 Nontoxic appearance on video.  Speaking in full sentences without respiratory distress.  Appropriate responses.  Coherent responses.  All questions were answered with understanding of plan expressed. Assessment and Plan: COVID-19 virus infection  ILD (interstitial lung disease) ([place]) Improving with use of antibiotic, antiviral, Mucinex, Tessalon Perles.  Nausea has resolved, not requiring antiemetic.  Drinking fluids, no dyspnea, no chest pain.  We will continue home treatment with ER/urgent care precautions.  Quarantine and masking guidelines were again discussed.  All questions answered.  Follow Up Instructions: As needed   I discussed the assessment and treatment plan with the patient. The patient was provided an opportunity to ask questions and all were answered. The patient agreed with the plan and demonstrated an understanding of the instructions.   The patient was advised to call back or seek an in-person evaluation if the symptoms worsen or if the condition fails to improve as anticipated.   [name], MD

## 2022-03-11 ENCOUNTER — Ambulatory Visit (INDEPENDENT_AMBULATORY_CARE_PROVIDER_SITE_OTHER): Payer: Medicare HMO | Admitting: Family Medicine

## 2022-03-11 ENCOUNTER — Telehealth: Payer: Self-pay | Admitting: Family Medicine

## 2022-03-11 ENCOUNTER — Ambulatory Visit (INDEPENDENT_AMBULATORY_CARE_PROVIDER_SITE_OTHER)
Admission: RE | Admit: 2022-03-11 | Discharge: 2022-03-11 | Disposition: A | Discharge status: Home / Self Care | Payer: Medicare HMO | Source: Ambulatory Visit | Attending: Family Medicine | Admitting: Family Medicine

## 2022-03-11 VITALS — BP 128/70 | HR 63 | Temp 98.2°F | Resp 16 | Ht 67.0 in | Wt 158.6 lb

## 2022-03-11 DIAGNOSIS — U071 COVID-19: Secondary | ICD-10-CM

## 2022-03-11 DIAGNOSIS — R052 Subacute cough: Secondary | ICD-10-CM | POA: Diagnosis not present

## 2022-03-11 DIAGNOSIS — R059 Cough, unspecified: Secondary | ICD-10-CM | POA: Diagnosis not present

## 2022-03-11 DIAGNOSIS — J849 Interstitial pulmonary disease, unspecified: Secondary | ICD-10-CM

## 2022-03-11 DIAGNOSIS — S8011XA Contusion of right lower leg, initial encounter: Secondary | ICD-10-CM

## 2022-03-11 MED ORDER — BUDESONIDE-FORMOTEROL FUMARATE 80-4.5 MCG/ACT IN AERO: 2.0000 | INHALATION_SPRAY | Freq: Two times a day (BID) | RESPIRATORY_TRACT | 3 refills | Status: DC

## 2022-03-11 NOTE — Telephone Encounter (Signed)
Pt called back and was advised

## 2022-03-11 NOTE — Telephone Encounter (Signed)
Caller name: [name]   On DPR? :yes/no: Yes  Call back number: [phone]  Provider they see:  Carlota Raspberry   Reason for call: pt called stating that he needs more clarification on how he should be taking  his medications.

## 2022-03-11 NOTE — Telephone Encounter (Signed)
Ok to continue same dose of amlodipine as BP looked ok today. Resume simvastatin as off paxlovid.

## 2022-03-11 NOTE — Telephone Encounter (Signed)
Pt wants verification he is supposed to resume taking simvastatin and pt is unsure if he is to take 2.5 mg amlodipine as he has been or if he is to increase to the '5mg'$ 

## 2022-03-11 NOTE — Progress Notes (Unsigned)
Subjective:  Patient ID: [name], male    DOB: [birth year]  Age: 81 y.o. MRN: [id number]  CC:  Chief Complaint  Patient presents with  . Memory Loss    Revisit memory concerns   . Results    Discuss lab results   . Cough    Pt is week post COVID notes he has a lingering cough and wondered if you would be able to address this with the time we have today or if he should revisit     HPI [name] presents for   COVID-19 infection With underlying interstitial lung disease.  Initial symptoms July 15, initial eval July 17.  Coarse breath sounds noted at that time with rhonchi.  Treated with Paxlovid, azithromycin, prednisone, Mucinex and Tessalon Perles.  Improving at follow-up 2 days later July 19 with normal O2 sats.  No dyspnea at that time. Finished all meds.  Taking symbicort as needed in past (sample from pulmonary) using 2 puffs in am, better, cough worse in evening. Clear mucus usually. Rare tinge of yellow.  No fever. Overall feeling good except still some residual cough, still some fatigue.  Negative testing a week ago.  Not needing albuterol. Some wheeze at times.   Memory difficulty Discussed at physical June 29, last sharpness, concerned about his short-term memory at times over the previous year.  He was independent in ADL, IADL, not forgetting faces or getting lost.  He was taking Prevagen as supplementation.  Elevated B12 at 1486, not low on June 29 labs.  Normal TSH.  MoCA score reassuring at 27. No change in memory. Declines referral to memory specialist at this time.  On MVI.   Bump on R lower leg Past month nki.  No calf pain.    History Patient Active Problem List   Diagnosis Date Noted  . Hypertrophy of prostate without urinary obstruction and other lower urinary tract symptoms (LUTS) 12/04/2021  . Prostate cancer ([place]) 05/29/2021  . GERD (gastroesophageal reflux disease) 12/15/2017  . Unstable angina ([place]) 12/05/2017  . Angina at rest [facility])  12/05/2017  . Atypical chest pain   . Dyslipidemia   . COPD not affecting current episode of care [facility])   . Essential hypertension 05/29/2017  . Abnormal nuclear cardiac imaging test 01/14/2017  . Bronchiectasis without complication ([place]) 70/96/2836  . ILD (interstitial lung disease) ([place]) 11/27/2016  . Dizziness 10/21/2016  . Cough variant asthma  vs UACS 07/22/2016  . Obstructive sleep apnea 12/01/2015  . Acute bronchitis 11/30/2015  . Swelling of lower extremity 10/16/2015  . Benign mole 02/04/2014  . Impaired glucose tolerance 10/26/2013  . NAFLD (nonalcoholic fatty liver disease) 04/27/2013  . Thrombocytopenia ([place]) 10/21/2012  . Exertional angina (HCC) 09/03/2012  . MALARIA 12/05/2009  . COLONIC POLYPS 12/05/2009  . HYPERCHOLESTEROLEMIA 12/05/2009  . ANXIETY 12/05/2009  . HEMORRHOIDS 12/05/2009  . Allergic rhinitis 12/05/2009  . GANGLION CYST, WRIST, LEFT 12/05/2009  . ROTATOR CUFF TEAR 12/05/2009  . OTHER DISORDER OF MUSCLE LIGAMENT AND FASCIA 12/05/2009  . ABNORMAL CHEST XRAY 12/05/2009  . CAD (coronary artery disease) 04/05/2009   Past Medical History:  Diagnosis Date  . Abnormal chest x-ray   . Allergic rhinitis   . Allergy   . Ankylosing spondylitis ([place])   . Anxiety   . Arthritis   . Colonic polyp   . Coronary atherosclerosis of native coronary artery   . Cough   . Dizziness 10/21/2016  . Ganglion cyst of wrist  left  . Heart attack ([place]) 2008  . Hemorrhoids   . History of malaria   . Hyperlipidemia    with low HDL  . Hypertension   . Kidney stone   . Other disorder of muscle, ligament, and fascia   . Rotator cuff tear   . Swelling of lower extremity 10/16/2015   Past Surgical History:  Procedure Laterality Date  . bicep rupture    . CORONARY ANGIOPLASTY WITH STENT PLACEMENT    . HEMORRHOID SURGERY  05/2009   in [place]  . LEFT HEART CATH AND CORONARY ANGIOGRAPHY N/A 01/14/2017   Procedure: Left Heart Cath and Coronary Angiography;  Surgeon: [name], MD;  Location: [facility];  Service: Cardiovascular;  Laterality: N/A;  . LEFT HEART CATHETERIZATION WITH CORONARY ANGIOGRAM N/A 09/03/2012   Procedure: LEFT HEART CATHETERIZATION WITH CORONARY ANGIOGRAM;  Surgeon: [name], MD;  Location: [facility] CATH LAB;  Service: Cardiovascular;  Laterality: N/A;  . PROSTATE BIOPSY    . ROTATOR CUFF REPAIR     bilat by Dr. French Ana   Allergies  Allergen Reactions  . Celebrex [Celecoxib]     Pt states precipitated his heart attack.  . Tramadol Other (See Comments)    Makes patient jumpy   Prior to Admission medications   Medication Sig Start Date End Date Taking? Authorizing Provider  amLODipine (NORVASC) 5 MG tablet Take 1 tablet (5 mg total) by mouth daily. Patient taking differently: Take 5 mg by mouth daily. Taking half tablet 03/13/21  Yes Burt Knack, Legrand Como, MD  Apoaequorin (PREVAGEN PO) Take 1 tablet by mouth daily.    Yes [provider]  aspirin 81 MG EC tablet Take 81 mg by mouth daily.   Yes [provider]  chlorpheniramine (CHLOR-TRIMETON) 4 MG tablet 1 -2 every 4 hours as needed 08/14/20  Yes [name], MD  doxazosin (CARDURA) 1 MG tablet Take 1 tablet (1 mg total) by mouth daily. 02/07/22  Yes [name], MD  famotidine (PEPCID) 20 MG tablet Take 20 mg by mouth at bedtime.   Yes [provider]  famotidine (PEPCID) 20 MG tablet Take by mouth.   Yes [provider]  Multiple Vitamins-Minerals (ICAPS AREDS 2) CAPS Take by mouth daily.   Yes [provider]  ondansetron (ZOFRAN-ODT) 4 MG disintegrating tablet Take 1 tablet (4 mg total) by mouth every 8 (eight) hours as needed for nausea or vomiting. 02/25/22  Yes [name], MD  pantoprazole (PROTONIX) 40 MG tablet TAKE 1 TABLET 1 TIME DAILY 30 TO 60 MINUTES BEFORE FIRST MEAL OF THE DAY 11/23/21  Yes [name], MD  predniSONE (DELTASONE) 20 MG tablet Take 2 tablets (40 mg total) by mouth daily with breakfast.  02/25/22  Yes [name], MD  albuterol (VENTOLIN HFA) 108 (90 Base) MCG/ACT inhaler Inhale into the lungs every 6 (six) hours as needed for wheezing or shortness of breath. Patient not taking: Reported on 12/04/2021    [provider]  benzonatate (TESSALON) 100 MG capsule Take 1 capsule (100 mg total) by mouth 3 (three) times daily as needed for cough. Patient not taking: Reported on 03/11/2022 02/25/22   [name], MD  simvastatin (ZOCOR) 20 MG tablet TAKE 1 TABLET EVERY DAY WITH BREAKFAST (APPOINTMENT IS NEEDED FOR REFILLS) Patient not taking: Reported on 03/11/2022 12/19/21   [name], MD  UNABLE TO FIND Med Name: CPAP per Dr Halford Chessman Patient not taking: Reported on 02/25/2022  [provider]   Social History   Socioeconomic History  . Marital status: Married    Spouse name: [name]  . Number of children: 2  . Years of education: Not on file  . Highest education level: Not on file  Occupational History  . Not on file  Tobacco Use  . Smoking status: Never  . Smokeless tobacco: Never  Vaping Use  . Vaping Use: Never used  Substance and Sexual Activity  . Alcohol use: Yes    Comment: holiday Rare   . Drug use: No  . Sexual activity: Not Currently  Other Topics Concern  . Not on file  Social History Narrative  . Not on file   Social Determinants of Health   Financial Resource Strain: Not on file  Food Insecurity: Not on file  Transportation Needs: Not on file  Physical Activity: Not on file  Stress: Not on file  Social Connections: Not on file  Intimate Partner Violence: Not on file    Review of Systems   Objective:   Vitals:   03/11/22 1107  BP: 128/70  Pulse: 63  Resp: 16  Temp: 98.2 F (36.8 C)  TempSrc: Oral  SpO2: 95%  Weight: 158 lb 9.6 oz (71.9 kg)  Height: '5\' 7"'$  (1.702 m)     Physical Exam Vitals reviewed.  Constitutional:      Appearance: He is well-developed.  HENT:     Head: Normocephalic and atraumatic.   Neck:     Vascular: No carotid bruit or JVD.  Cardiovascular:     Rate and Rhythm: Normal rate and regular rhythm.     Heart sounds: Normal heart sounds. No murmur heard. Pulmonary:     Effort: Pulmonary effort is normal.     Breath sounds: No stridor.     Comments: Faint but diffuse coarse breath sounds, no audible wheeze.  Normal effort, no distress, speaking in full sentences. Musculoskeletal:     Right lower leg: No edema.     Left lower leg: No edema.  Skin:    General: Skin is warm and dry.       Neurological:     Mental Status: He is alert and oriented to person, place, and time.  Psychiatric:        Mood and Affect: Mood normal.       Assessment & Plan:  [name] is a 81 y.o. male . No diagnosis found.   No orders of the defined types were placed in this encounter.  There are no Patient Instructions on file for this visit.    Signed,   Merri Ray, MD [place] 03/11/22 11:30 AM

## 2022-03-11 NOTE — Patient Instructions (Signed)
Glad to hear that you are improving.  It is not uncommon for cough to linger for some time after viral infection but should be improving.  Try Symbicort 2 puffs twice per day, not just once per day and albuterol if needed for any wheezing.  Please have x-ray done at the [facility] location below.  If cough is not improving in the next week or 2 I do recommend discussing other options with your pulmonologist, Dr. Melvyn Novas.   Bump on your right lower leg appears to be a contusion or bruise.  It is not uncommon for that to remain swollen for some time but should be improving over the next 4 to 6 weeks.  Discoloration below that area appears to be a healing bruise.  Follow-up in the next 4 to 6 weeks and we can discuss memory again at that time but I am happy to refer you to memory specialist sooner if needed.  Return to the clinic or go to the nearest emergency room if any of your symptoms worsen or new symptoms occur.   [name]  Walk in 8:30-4:30 during weekdays, no appointment needed [place].  [place], [place] [address]

## 2022-03-11 NOTE — Telephone Encounter (Signed)
Lm to discuss directions

## 2022-03-12 ENCOUNTER — Encounter: Payer: Self-pay | Admitting: Family Medicine

## 2022-03-14 NOTE — Progress Notes (Signed)
Office Visit Note  Patient: [name]             Date of Birth: 1941-06-07           MRN: [id number]             PCP: [name], MD Referring: [name], MD Visit Date: 03/28/2022 Occupation: '@GUAROCC'$ @  Subjective:  Joint stiffness  History of Present Illness: [name] is a 81 y.o. male with history of osteoarthritis, degenerative disc disease and interstitial lung disease.  He states he continues to have stiffness in his neck and lower back.  He also has a stiffness in his other joints involving hands and his knees.  He denies any history of Achilles tendinitis or planter fasciitis.  There is no history of uveitis.  He states that he developed COVID-19 virus infection about a month ago.  He is still gradually recovering from it.  He has shortness of breath.  He was having difficulty swallowing.  Activities of Daily Living:  Patient reports morning stiffness for 5 minutes.   Patient Denies nocturnal pain.  Difficulty dressing/grooming: Reports Difficulty climbing stairs: Denies Difficulty getting out of chair: Denies Difficulty using hands for taps, buttons, cutlery, and/or writing: Denies  Review of Systems  Constitutional:  Negative for fatigue.  HENT:  Negative for mouth sores and mouth dryness.   Eyes:  Negative for dryness.  Respiratory:  Negative for shortness of breath.   Cardiovascular:  Negative for chest pain and palpitations.  Gastrointestinal:  Negative for blood in stool, constipation and diarrhea.  Endocrine: Negative for increased urination.  Genitourinary:  Positive for difficulty urinating. Negative for involuntary urination.  Musculoskeletal:  Positive for joint pain, joint pain and morning stiffness. Negative for gait problem, joint swelling, myalgias, muscle weakness, muscle tenderness and myalgias.  Skin:  Negative for color change, rash, hair loss and sensitivity to sunlight.  Allergic/Immunologic: Negative for susceptible to  infections.  Neurological:  Negative for dizziness and headaches.  Hematological:  Negative for swollen glands.  Psychiatric/Behavioral:  Negative for depressed mood and sleep disturbance. The patient is not nervous/anxious.     PMFS History:  Patient Active Problem List   Diagnosis Date Noted   Hypertrophy of prostate without urinary obstruction and other lower urinary tract symptoms (LUTS) 12/04/2021   Prostate cancer ([place]) 05/29/2021   GERD (gastroesophageal reflux disease) 12/15/2017   Unstable angina ([place]) 12/05/2017   Angina at rest [facility]) 12/05/2017   Atypical chest pain    Dyslipidemia    COPD not affecting current episode of care [facility])    Essential hypertension 05/29/2017   Abnormal nuclear cardiac imaging test 01/14/2017   Bronchiectasis without complication ([place]) 80/99/8338   ILD (interstitial lung disease) ([place]) 11/27/2016   Dizziness 10/21/2016   Cough variant asthma  vs UACS 07/22/2016   Obstructive sleep apnea 12/01/2015   Acute bronchitis 11/30/2015   Swelling of lower extremity 10/16/2015   Benign mole 02/04/2014   Impaired glucose tolerance 10/26/2013   NAFLD (nonalcoholic fatty liver disease) 04/27/2013   Thrombocytopenia ([place]) 10/21/2012   Exertional angina ([place]) 09/03/2012   MALARIA 12/05/2009   COLONIC POLYPS 12/05/2009   HYPERCHOLESTEROLEMIA 12/05/2009   ANXIETY 12/05/2009   HEMORRHOIDS 12/05/2009   Allergic rhinitis 12/05/2009   GANGLION CYST, WRIST, LEFT 12/05/2009   ROTATOR CUFF TEAR 12/05/2009   OTHER DISORDER OF MUSCLE LIGAMENT AND FASCIA 12/05/2009   ABNORMAL CHEST XRAY 12/05/2009   CAD (coronary artery disease) 04/05/2009    Past  Medical History:  Diagnosis Date   Abnormal chest x-ray    Allergic rhinitis    Allergy    Ankylosing spondylitis (HCC)    Anxiety    Arthritis    Colonic polyp    Coronary atherosclerosis of native coronary artery    Cough    Dizziness 10/21/2016   Ganglion cyst of wrist    left   Heart attack ([place]) 2008    Hemorrhoids    History of malaria    Hyperlipidemia    with low HDL   Hypertension    Kidney stone    Other disorder of muscle, ligament, and fascia    Rotator cuff tear    Swelling of lower extremity 10/16/2015    Family History  Problem Relation Age of Onset   Cancer Mother        melanoma   Emphysema Mother    Rheum arthritis Mother    Diabetes Mother    Alcohol abuse Father    Past Surgical History:  Procedure Laterality Date   bicep rupture     CORONARY ANGIOPLASTY WITH STENT PLACEMENT     HEMORRHOID SURGERY  05/2009   in [facility]   LEFT HEART CATH AND CORONARY ANGIOGRAPHY N/A 01/14/2017   Procedure: Left Heart Cath and Coronary Angiography;  Surgeon: [name], MD;  Location: [facility] [facility];  Service: Cardiovascular;  Laterality: N/A;   LEFT HEART CATHETERIZATION WITH CORONARY ANGIOGRAM N/A 09/03/2012   Procedure: LEFT HEART CATHETERIZATION WITH CORONARY ANGIOGRAM;  Surgeon: [name], MD;  Location: [facility] C[facility];  Service: Cardiovascular;  Laterality: N/A;   PROSTATE BIOPSY     ROTATOR CUFF REPAIR     bilat by Dr. French Ana   Social History   Social History Narrative   Not on file   Immunization History  Administered Date(s) Administered   Fluad Quad(high Dose 65+) 05/01/2019, 05/28/2021   Influenza Split 06/01/2012   Influenza Whole 05/30/2009, 04/20/2010   Influenza, High Dose Seasonal PF 05/19/2017, 05/19/2018   Influenza-Unspecified 05/13/2007, 07/12/2013, 05/10/2016, 06/23/2020   PFIZER(Purple Top)SARS-COV-2 Vaccination 09/18/2019, 10/09/2019, 07/13/2020   PNEUMOCOCCAL CONJUGATE-20 01/14/2022   Pneumococcal Conjugate-13 05/19/2018   Pneumococcal Polysaccharide-23 08/22/2009   Tdap 12/04/2010, 06/04/2011, 07/31/2018   Zoster Recombinat (Shingrix) 01/14/2022   Zoster, Live 05/17/2008     Objective: Vital Signs: BP 125/72 (BP Location: Left Arm, Patient Position: Sitting, Cuff Size: Normal)   Pulse 62   Resp 15   Ht '5\' 7"'$  (1.702 m)   Wt 161  lb 9.6 oz (73.3 kg)   BMI 25.31 kg/m    Physical Exam Vitals and nursing note reviewed.  Constitutional:      Appearance: He is well-developed.  HENT:     Head: Normocephalic and atraumatic.  Eyes:     Conjunctiva/sclera: Conjunctivae normal.     Pupils: Pupils are equal, round, and reactive to light.  Cardiovascular:     Rate and Rhythm: Normal rate and regular rhythm.     Heart sounds: Normal heart sounds.  Pulmonary:     Effort: Pulmonary effort is normal.     Breath sounds: Normal breath sounds.  Abdominal:     General: Bowel sounds are normal.     Palpations: Abdomen is soft.  Musculoskeletal:     Cervical back: Normal range of motion and neck supple.  Skin:    General: Skin is warm and dry.     Capillary Refill: Capillary refill takes less than 2 seconds.  Neurological:     Mental  Status: He is alert and oriented to person, place, and time.  Psychiatric:        Behavior: Behavior normal.      Musculoskeletal Exam: He had limited lateral rotation of the cervical spine with discomfort.  He had limited range of motion of the thoracic and lumbar spine.  He had no SI joint tenderness.  Shoulder joints, elbow joints, wrist joints, MCPs PIPs and DIPs with good range of motion.  He had a bilateral PIP and DIP thickening.  Hip joints and knee joints with good range of motion.  He had no tenderness over ankles or MTPs.  CDAI Exam: CDAI Score: -- Patient Global: --; Provider Global: -- Swollen: --; Tender: -- Joint Exam 03/28/2022   No joint exam has been documented for this visit   There is currently no information documented on the homunculus. Go to the Rheumatology activity and complete the homunculus joint exam.  Investigation: No additional findings.  Imaging: [name] 2 View  Result Date: 03/11/2022 CLINICAL DATA:  Cough and congestion. History of interstitial lung disease. EXAM: CHEST - 2 VIEW COMPARISON:  Chest radiograph 07/29/2019 FINDINGS: The  cardiomediastinal silhouette is stable. There is unchanged calcified atherosclerotic plaque of the aortic arch There is unchanged asymmetric elevation of the right hemidiaphragm. Coarse opacities in the left lower lobe/retrocardiac region are similar to the prior study and are favored to reflect chronic scarring/interstitial disease when correlated with prior CT abdomen/pelvis. There is no new or worsening focal airspace disease. There is no pulmonary edema. There is no pleural effusion or pneumothorax. There is no acute osseous abnormality. There is multilevel degenerative change of the thoracic spine. IMPRESSION: 1. Unchanged asymmetric elevation of the right hemidiaphragm and coarse opacities in the left base favored to reflect scarring/interstitial lung disease. 2. No new focal consolidation or pleural effusion. Electronically Signed   By: Valetta Mole M.D.   On: 03/11/2022 13:04    Recent Labs: Lab Results  Component Value Date   WBC 9.5 12/04/2021   HGB 13.4 12/04/2021   PLT 309.0 12/04/2021   NA 141 02/07/2022   K 4.5 02/07/2022   CL 102 02/07/2022   CO2 32 02/07/2022   GLUCOSE 104 (H) 02/07/2022   BUN 20 02/07/2022   CREATININE 0.93 02/07/2022   BILITOT 0.8 02/07/2022   ALKPHOS 69 02/07/2022   AST 23 02/07/2022   ALT 17 02/07/2022   PROT 7.3 02/07/2022   ALBUMIN 4.8 02/07/2022   CALCIUM 9.9 02/07/2022   GFRAA 96 01/03/2020    Speciality Comments: No specialty comments available.  Procedures:  No procedures performed Allergies: Celebrex [celecoxib] and Tramadol   Assessment / Plan:     Visit Diagnoses: DDD (degenerative disc disease), cervical -he continues to have neck stiffness.  He had limited lateral rotation with crepitus without discomfort.  There was no radiculopathy.  X-ray findings are consistent with disc disease and facet joint arthropathy.   Pain in thoracic spine -he is intermittent thoracic pain.  X-ray findings are consistent with degenerative disease of the  thoracic spine.  Chronic midline low back pain without sciatica -he gives history of on and off discomfort in his lower back.  He denies any radiculopathy.  He had limited mobility.  There was no point tenderness over the lumbar spine.  He has morning stiffness lasting for about 5 minutes.  X-ray findings are consistent with degenerative disc disease.   Chronic SI joint pain-x-rays of his pelvis were unremarkable except for some osteoarthritic changes.  He  had no SI joint tenderness on examination.  Primary osteoarthritis of both hands - clinical findings are consistent with osteoarthritis.   Chronic pain of both knees -he denies any discomfort in his knee joints today.  He states overall his muscle strength is not good.  He walks about half a mile.  Since he had COVID-19 infection his mobility has been limited due to shortness of breath.  X-rays were consistent with mild osteoarthritis and chondromalacia patella.   Chronic pain of both ankles - The x-rays were unremarkable.  ILD (interstitial lung disease) ([place]) - NSIP diagnosed by Dr. Melvyn Novas in the past. -His last high-resolution CT was on April 2018.  He has had several chest x-rays since then.  PFTs were done in December 2020.  I will obtain autoimmune labs in a month as he had recent infection.  I will also refer him to ILD clinic.  Plan: CK, Rheumatoid factor, Cyclic citrul peptide antibody, IgG, ANA, Anti-scleroderma antibody, RNP Antibody, Anti-Smith antibody, Sjogrens syndrome-A extractable nuclear antibody, Sjogrens syndrome-B extractable nuclear antibody, Anti-DNA antibody, double-stranded, C3 and C4, Sedimentation rate.  I we will also send a message to Dr. Melvyn Novas if the patient needs to be seen in the ILD clinic.  He has no history of oral ulcers, nasal ulcers, malar rash, photosensitivity, Raynaud's phenomenon, lymphadenopathy or inflammatory arthritis.  Bronchiectasis without complication ([place])  Coronary artery disease involving native  coronary artery of native heart with angina pectoris ([place])  Essential hypertension-his blood pressure was normal today.  Dyslipidemia  NAFLD (nonalcoholic fatty liver disease)  Gastroesophageal reflux disease without esophagitis  History of colonic polyps  Thrombocytopenia (HCC) - followed by Dr. Earlie Server  History of anxiety  Obstructive sleep apnea  COVID-19 virus infection-patient had COVID-19 virus infection about a month ago.  He is gradually recovering from it.  He is using inhalers currently which are helpful.  He still experiences some shortness of breath.  Orders: Orders Placed This Encounter  Procedures   CK   Rheumatoid factor   Cyclic citrul peptide antibody, IgG   ANA   Anti-scleroderma antibody   RNP Antibody   Anti-Smith antibody   Sjogrens syndrome-A extractable nuclear antibody   Sjogrens syndrome-B extractable nuclear antibody   Anti-DNA antibody, double-stranded   C3 and C4   Sedimentation rate   Other/Misc lab test   3-Hydroxy-3-Methylglutaryl-Coenzyme A Reductase (HMGCR) AB (IgG)   No orders of the defined types were placed in this encounter.  .  Follow-Up Instructions: Return in about 6 months (around 09/28/2022) for Osteoarthritis.   Bo Merino, MD  Note - This record has been created using Editor, commissioning.  Chart creation errors have been sought, but may not always  have been located. Such creation errors do not reflect on  the standard of medical care.

## 2022-03-20 ENCOUNTER — Ambulatory Visit: Payer: Medicare HMO | Admitting: Family Medicine

## 2022-03-22 ENCOUNTER — Ambulatory Visit: Payer: Medicare HMO | Admitting: Rheumatology

## 2022-03-28 ENCOUNTER — Encounter: Payer: Self-pay | Admitting: Rheumatology

## 2022-03-28 ENCOUNTER — Telehealth: Payer: Self-pay | Admitting: *Deleted

## 2022-03-28 ENCOUNTER — Ambulatory Visit: Payer: Medicare HMO | Attending: Rheumatology | Admitting: Rheumatology

## 2022-03-28 VITALS — BP 125/72 | HR 62 | Resp 15 | Ht 67.0 in | Wt 161.6 lb

## 2022-03-28 DIAGNOSIS — M25571 Pain in right ankle and joints of right foot: Secondary | ICD-10-CM

## 2022-03-28 DIAGNOSIS — D696 Thrombocytopenia, unspecified: Secondary | ICD-10-CM

## 2022-03-28 DIAGNOSIS — I10 Essential (primary) hypertension: Secondary | ICD-10-CM

## 2022-03-28 DIAGNOSIS — Z8659 Personal history of other mental and behavioral disorders: Secondary | ICD-10-CM

## 2022-03-28 DIAGNOSIS — E785 Hyperlipidemia, unspecified: Secondary | ICD-10-CM

## 2022-03-28 DIAGNOSIS — J849 Interstitial pulmonary disease, unspecified: Secondary | ICD-10-CM

## 2022-03-28 DIAGNOSIS — I25119 Atherosclerotic heart disease of native coronary artery with unspecified angina pectoris: Secondary | ICD-10-CM

## 2022-03-28 DIAGNOSIS — M25561 Pain in right knee: Secondary | ICD-10-CM

## 2022-03-28 DIAGNOSIS — K760 Fatty (change of) liver, not elsewhere classified: Secondary | ICD-10-CM

## 2022-03-28 DIAGNOSIS — M546 Pain in thoracic spine: Secondary | ICD-10-CM

## 2022-03-28 DIAGNOSIS — M545 Low back pain, unspecified: Secondary | ICD-10-CM

## 2022-03-28 DIAGNOSIS — M19041 Primary osteoarthritis, right hand: Secondary | ICD-10-CM

## 2022-03-28 DIAGNOSIS — M5030 Other cervical disc degeneration, unspecified cervical region: Secondary | ICD-10-CM

## 2022-03-28 DIAGNOSIS — M25572 Pain in left ankle and joints of left foot: Secondary | ICD-10-CM

## 2022-03-28 DIAGNOSIS — J479 Bronchiectasis, uncomplicated: Secondary | ICD-10-CM

## 2022-03-28 DIAGNOSIS — M19042 Primary osteoarthritis, left hand: Secondary | ICD-10-CM

## 2022-03-28 DIAGNOSIS — Z86010 Personal history of colonic polyps: Secondary | ICD-10-CM

## 2022-03-28 DIAGNOSIS — U071 COVID-19: Secondary | ICD-10-CM

## 2022-03-28 DIAGNOSIS — M533 Sacrococcygeal disorders, not elsewhere classified: Secondary | ICD-10-CM

## 2022-03-28 DIAGNOSIS — M25562 Pain in left knee: Secondary | ICD-10-CM

## 2022-03-28 DIAGNOSIS — K219 Gastro-esophageal reflux disease without esophagitis: Secondary | ICD-10-CM

## 2022-03-28 DIAGNOSIS — G8929 Other chronic pain: Secondary | ICD-10-CM

## 2022-03-28 DIAGNOSIS — G4733 Obstructive sleep apnea (adult) (pediatric): Secondary | ICD-10-CM

## 2022-03-28 NOTE — Telephone Encounter (Signed)
-----   Message from Bo Merino, MD sent at 03/28/2022  8:41 AM EDT ----- Dr. Melvyn Novas,  [name] was diagnosed with NSIP in 2020 based on high-resolution CT.  He had PFTs in 2020.  He states his shortness of breath is getting worse.  I ordered some autoimmune labs although he does not have any history of autoimmune disease.  I will wait to do the labs in a month as he had recent COVID-19 virus infection. He does not have a follow-up appointment with you.  Would you like for me to refer him to ILD clinic or would you like to see him back in the office?  Thank you, Jairo Ben MD [facility] health rheumatology [address], [address] [place], [place] [address] Phone [phone]

## 2022-03-28 NOTE — Patient Instructions (Signed)
Please return in 1 month for your labs.

## 2022-03-29 ENCOUNTER — Ambulatory Visit: Payer: Medicare HMO | Admitting: Family Medicine

## 2022-03-29 ENCOUNTER — Other Ambulatory Visit: Payer: Self-pay | Admitting: *Deleted

## 2022-03-29 DIAGNOSIS — J849 Interstitial pulmonary disease, unspecified: Secondary | ICD-10-CM

## 2022-03-31 ENCOUNTER — Other Ambulatory Visit: Payer: Self-pay | Admitting: Cardiovascular Disease

## 2022-03-31 ENCOUNTER — Other Ambulatory Visit: Payer: Self-pay | Admitting: Family Medicine

## 2022-03-31 DIAGNOSIS — R39198 Other difficulties with micturition: Secondary | ICD-10-CM

## 2022-03-31 DIAGNOSIS — R351 Nocturia: Secondary | ICD-10-CM

## 2022-03-31 DIAGNOSIS — R300 Dysuria: Secondary | ICD-10-CM

## 2022-04-01 DIAGNOSIS — C61 Malignant neoplasm of prostate: Secondary | ICD-10-CM | POA: Diagnosis not present

## 2022-04-03 ENCOUNTER — Ambulatory Visit (INDEPENDENT_AMBULATORY_CARE_PROVIDER_SITE_OTHER): Payer: Medicare HMO

## 2022-04-03 DIAGNOSIS — Z0000 Encounter for general adult medical examination without abnormal findings: Secondary | ICD-10-CM

## 2022-04-03 NOTE — Progress Notes (Signed)
Subjective:   [name] is a 81 y.o. male who presents for an Subsequent  Medicare Annual Wellness Visit.   I connected with [name]  today by telephone and verified that I am speaking with the correct person using two identifiers. Location patient: home Location provider: work Persons participating in the virtual visit: patient, provider.   I discussed the limitations, risks, security and privacy concerns of performing an evaluation and management service by telephone and the availability of in person appointments. I also discussed with the patient that there may be a patient responsible charge related to this service. The patient expressed understanding and verbally consented to this telephonic visit.    Interactive audio and video telecommunications were attempted between this provider and patient, however failed, due to patient having technical difficulties OR patient did not have access to video capability.  We continued and completed visit with audio only.    Review of Systems     Cardiac Risk Factors include: advanced age (>70mn, >>60women);male gender     Objective:    Today's Vitals   There is no height or weight on file to calculate BMI.     04/03/2022   10:25 AM 02/07/2022    9:47 AM 02/25/2020    9:31 AM 07/31/2018    9:44 PM 12/05/2017    6:13 AM 10/20/2017    8:39 AM 04/21/2017    8:48 AM  Advanced Directives  Does Patient Have a Medical Advance Directive? Yes Yes Yes Yes No;Yes No No  Type of AParamed[place]of [place]  [place] Living will;Healthcare Power of Attorney    Does patient want to make changes to medical advance directive?  No - Patient declined Yes (Inpatient - patient defers changing a medical advance directive and declines information at this time)      Copy of [place] Chart? No - copy requested        Would  patient like information on creating a medical advance directive?      No - Patient declined     Current Medications (verified) Outpatient Encounter Medications as of 04/03/2022  Medication Sig   albuterol (VENTOLIN HFA) 108 (90 Base) MCG/ACT inhaler Inhale into the lungs every 6 (six) hours as needed for wheezing or shortness of breath.   amLODipine (NORVASC) 5 MG tablet TAKE 1 TABLET EVERY DAY   Apoaequorin (PREVAGEN PO) Take 1 tablet by mouth daily.    aspirin 81 MG EC tablet Take 81 mg by mouth daily.   budesonide-formoterol (SYMBICORT) 80-4.5 MCG/ACT inhaler Inhale 2 puffs into the lungs 2 (two) times daily.   chlorpheniramine (CHLOR-TRIMETON) 4 MG tablet 1 -2 every 4 hours as needed   doxazosin (CARDURA) 1 MG tablet TAKE 1 TABLET (1 MG TOTAL) BY MOUTH DAILY.   famotidine (PEPCID) 20 MG tablet Take 20 mg by mouth at bedtime.   famotidine (PEPCID) 20 MG tablet Take by mouth.   Multiple Vitamins-Minerals (ICAPS AREDS 2) CAPS Take by mouth daily.   pantoprazole (PROTONIX) 40 MG tablet TAKE 1 TABLET 1 TIME DAILY 30 TO 60 MINUTES BEFORE FIRST MEAL OF THE DAY   simvastatin (ZOCOR) 20 MG tablet TAKE 1 TABLET EVERY DAY WITH BREAKFAST (APPOINTMENT IS NEEDED FOR REFILLS)   UNABLE TO FIND Med Name: CPAP per Dr SHalford Chessman  benzonatate (TESSALON) 100 MG capsule Take 1 capsule (100 mg total) by mouth 3 (three) times  daily as needed for cough. (Patient not taking: Reported on 03/11/2022)   ondansetron (ZOFRAN-ODT) 4 MG disintegrating tablet Take 1 tablet (4 mg total) by mouth every 8 (eight) hours as needed for nausea or vomiting. (Patient not taking: Reported on 04/03/2022)   predniSONE (DELTASONE) 20 MG tablet Take 2 tablets (40 mg total) by mouth daily with breakfast. (Patient not taking: Reported on 03/28/2022)   No facility-administered encounter medications on file as of 04/03/2022.    Allergies (verified) Celebrex [celecoxib] and Tramadol   History: Past Medical History:  Diagnosis Date   Abnormal  chest x-ray    Allergic rhinitis    Allergy    Ankylosing spondylitis (HCC)    Anxiety    Arthritis    Colonic polyp    Coronary atherosclerosis of native coronary artery    Cough    Dizziness 10/21/2016   Ganglion cyst of wrist    left   Heart attack ([place]) 2008   Hemorrhoids    History of malaria    Hyperlipidemia    with low HDL   Hypertension    Kidney stone    Other disorder of muscle, ligament, and fascia    Rotator cuff tear    Swelling of lower extremity 10/16/2015   Past Surgical History:  Procedure Laterality Date   bicep rupture     CORONARY ANGIOPLASTY WITH STENT PLACEMENT     HEMORRHOID SURGERY  05/2009   in [place]   LEFT HEART CATH AND CORONARY ANGIOGRAPHY N/A 01/14/2017   Procedure: Left Heart Cath and Coronary Angiography;  Surgeon: [name], MD;  Location: [facility];  Service: Cardiovascular;  Laterality: N/A;   LEFT HEART CATHETERIZATION WITH CORONARY ANGIOGRAM N/A 09/03/2012   Procedure: LEFT HEART CATHETERIZATION WITH CORONARY ANGIOGRAM;  Surgeon: [name], MD;  Location: [facility] CATH LAB;  Service: Cardiovascular;  Laterality: N/A;   PROSTATE BIOPSY     ROTATOR CUFF REPAIR     bilat by Dr. French Ana   Family History  Problem Relation Age of Onset   Cancer Mother        melanoma   Emphysema Mother    Rheum arthritis Mother    Diabetes Mother    Alcohol abuse Father    Social History   Socioeconomic History   Marital status: Married    Spouse name: [name]   Number of children: 2   Years of education: Not on file   Highest education level: Not on file  Occupational History   Not on file  Tobacco Use   Smoking status: Never    Passive exposure: Never   Smokeless tobacco: Never  Vaping Use   Vaping Use: Never used  Substance and Sexual Activity   Alcohol use: Yes    Comment: holiday Rare    Drug use: No   Sexual activity: Not Currently  Other Topics Concern   Not on file  Social History Narrative   Not on file   Social  Determinants of Health   Financial Resource Strain: Low Risk  (04/03/2022)   Overall Financial Resource Strain (CARDIA)    Difficulty of Paying Living Expenses: Not hard at all  Food Insecurity: No Food Insecurity (04/03/2022)   Hunger Vital Sign    Worried About Running Out of Food in the Last Year: Never true    Ran Out of Food in the Last Year: Never true  Transportation Needs: No Transportation Needs (04/03/2022)   PRAPARE - Hydrologist (Medical):  No    Lack of Transportation (Non-Medical): No  Physical Activity: Inactive (04/03/2022)   Exercise Vital Sign    Days of Exercise per Week: 0 days    Minutes of Exercise per Session: 0 min  Stress: No Stress Concern Present (04/03/2022)   [place]    Feeling of Stress : Not at all  Social Connections: Moderately Integrated (04/03/2022)   Social Connection and Isolation Panel [NHANES]    Frequency of Communication with Friends and Family: Three times a week    Frequency of Social Gatherings with Friends and Family: Three times a week    Attends Religious Services: Never    Active Member of Clubs or Organizations: Yes    Attends Music therapist: More than 4 times per year    Marital Status: Married    Tobacco Counseling Counseling given: Not Answered   Clinical Intake:  Pre-visit preparation completed: Yes  Pain : No/denies pain     Nutritional Risks: None Diabetes: No  How often do you need to have someone help you when you read instructions, pamphlets, or other written materials from your doctor or pharmacy?: 1 - Never What is the last grade level you completed in school?: [place]   Interpreter Needed?: No  Information entered by :: L.Wilson,LPN   Activities of Daily Living    04/03/2022   10:31 AM 02/07/2022    9:14 AM  In your present state of health, do you have any difficulty performing the  following activities:  Hearing? 0 1  Comment  sometimes asks others to repeat themselves  Vision? 0 0  Difficulty concentrating or making decisions? 0 0  Walking or climbing stairs? 0 0  Dressing or bathing? 0 0  Doing errands, shopping? 0 0  Preparing Food and eating ? N   Using the Toilet? N   In the past six months, have you accidently leaked urine? N   Do you have problems with loss of bowel control? N   Managing your Medications? N   Managing your Finances? N   Housekeeping or managing your Housekeeping? N     Patient Care Team: [name], MD as PCP - General (Family Medicine) [name], MD as PCP - Cardiology (Cardiology)  Indicate any recent Medical Services you may have received from other than [facility] providers in the past year (date may be approximate).     Assessment:   This is a routine wellness examination for Daemien.  Hearing/Vision screen Vision Screening - Comments:: Annual eye exams wears glasses   Dietary issues and exercise activities discussed: Current Exercise Habits: Home exercise routine;The patient does not participate in regular exercise at present, Intensity: Mild   Goals Addressed   None    Depression Screen    04/03/2022   10:26 AM 04/03/2022   10:23 AM 03/11/2022   11:11 AM 02/07/2022    9:08 AM 12/04/2021   11:11 AM 03/12/2021    8:05 AM 09/11/2020    8:34 AM  PHQ 2/9 Scores  PHQ - 2 Score 0 0 0 0 0 0 0  PHQ- 9 Score     0      Fall Risk    04/03/2022   10:26 AM 03/11/2022   11:11 AM 02/25/2022    8:19 AM 02/07/2022    9:08 AM 12/04/2021   11:11 AM  Fall Risk   Falls in the past year? 0 0 0 0 0  Number falls in past yr: 0 0 0 0   Injury with Fall? 0 0 0 0   Risk for fall due to :  No Fall Risks  No Fall Risks No Fall Risks  Follow up Falls evaluation completed;Education provided Falls evaluation completed Falls evaluation completed Falls evaluation completed Falls evaluation completed    [place]:  Any stairs in or around the home? Yes  If so, are there any without handrails? No  Home free of loose throw rugs in walkways, pet beds, electrical cords, etc? Yes  Adequate lighting in your home to reduce risk of falls? Yes   ASSISTIVE DEVICES UTILIZED TO PREVENT FALLS:  Life alert? No  Use of a cane, walker or w/c? No  Grab bars in the bathroom? No  Shower chair or bench in shower? No  Elevated toilet seat or a handicapped toilet? No     Cognitive Function:  Normal cognitive status assessed by telephone conversation  by this Nurse Health Advisor. No abnormalities found.        04/03/2022   10:33 AM 02/07/2022    9:14 AM 02/25/2020    9:22 AM  6CIT Screen  What Year? 0 points 0 points 0 points  What month? 0 points 0 points 0 points  What time? 0 points 0 points 0 points  Count back from 20 0 points 0 points 0 points  Months in reverse 0 points 0 points 0 points  Repeat phrase 0 points 0 points 0 points  Total Score 0 points 0 points 0 points    Immunizations Immunization History  Administered Date(s) Administered   Fluad Quad(high Dose 65+) 05/01/2019, 05/28/2021   Influenza Split 06/01/2012   Influenza Whole 05/30/2009, 04/20/2010   Influenza, High Dose Seasonal PF 05/19/2017, 05/19/2018   Influenza-Unspecified 05/13/2007, 07/12/2013, 05/10/2016, 06/23/2020   PFIZER(Purple Top)SARS-COV-2 Vaccination 09/18/2019, 10/09/2019, 07/13/2020   PNEUMOCOCCAL CONJUGATE-20 01/14/2022   Pneumococcal Conjugate-13 05/19/2018   Pneumococcal Polysaccharide-23 08/22/2009   Tdap 12/04/2010, 06/04/2011, 07/31/2018   Zoster Recombinat (Shingrix) 01/14/2022   Zoster, Live 05/17/2008     TDAP status: Up to date  Flu Vaccine status: Up to date  Pneumococcal vaccine status: Up to date  Covid-19 vaccine status: Completed vaccines  Qualifies for Shingles Vaccine? Yes   Zostavax completed No   Shingrix Completed?: Yes  Screening Tests Health Maintenance  Topic Date  Due   COVID-19 Vaccine (4 - Pfizer risk series) 09/07/2020   INFLUENZA VACCINE  03/12/2022   Zoster Vaccines- Shingrix (2 of 2) 06/11/2022 (Originally 03/11/2022)   Hepatitis C Screening  12/20/2022 (Originally 06/08/1959)   TETANUS/TDAP  07/31/2028   Pneumonia Vaccine 83+ Years old  Completed   HPV VACCINES  Aged Out   COLONOSCOPY (Pts 45-42yr Insurance coverage will need to be confirmed)  Discontinued    Health Maintenance  Health Maintenance Due  Topic Date Due   COVID-19 Vaccine (4 - Pfizer risk series) 09/07/2020   INFLUENZA VACCINE  03/12/2022    Colorectal cancer screening: No longer required.   Lung Cancer Screening: (Low Dose CT Chest recommended if Age 79-80years, 30 pack-year currently smoking OR have quit w/in 15years.) does not qualify.   Lung Cancer Screening Referral: n/a  Additional Screening:  Hepatitis C Screening: does not qualify  Vision Screening: Recommended annual ophthalmology exams for early detection of glaucoma and other disorders of the eye. Is the patient up to date with their annual eye exam?  Yes  Who is the  provider or what is the name of the office in which the patient attends annual eye exams? Looking for new Eye Doctor  If pt is not established with a provider, would they like to be referred to a provider to establish care? No .   Dental Screening: Recommended annual dental exams for proper oral hygiene  Community Resource Referral / Chronic Care Management: CRR required this visit?  No   CCM required this visit?  No      Plan:     I have personally reviewed and noted the following in the patient's chart:   Medical and social history Use of alcohol, tobacco or illicit drugs  Current medications and supplements including opioid prescriptions. Patient is not currently taking opioid prescriptions. Functional ability and status Nutritional status Physical activity Advanced directives List of other physicians Hospitalizations,  surgeries, and ER visits in previous 12 months Vitals Screenings to include cognitive, depression, and falls Referrals and appointments  In addition, I have reviewed and discussed with patient certain preventive protocols, quality metrics, and best practice recommendations. A written personalized care plan for preventive services as well as general preventive health recommendations were provided to patient.     Daphane Shepherd, LPN   [id number]   Nurse Notes: none

## 2022-04-03 NOTE — Patient Instructions (Signed)
[name] , Thank you for taking time to come for your Medicare Wellness Visit. I appreciate your ongoing commitment to your health goals. Please review the following plan we discussed and let me know if I can assist you in the future.   Screening recommendations/referrals: Colonoscopy: no longer required  Recommended yearly ophthalmology/optometry visit for glaucoma screening and checkup Recommended yearly dental visit for hygiene and checkup  Vaccinations: Influenza vaccine: completed  Pneumococcal vaccine: completed  Tdap vaccine: 07/31/2018 Shingles vaccine: completed     Advanced directives:   Conditions/risks identified: none   Next appointment: none   Preventive Care 37 Years and Older, Male Preventive care refers to lifestyle choices and visits with your health care provider that can promote health and wellness. What does preventive care include? A yearly physical exam. This is also called an annual well check. Dental exams once or twice a year. Routine eye exams. Ask your health care provider how often you should have your eyes checked. Personal lifestyle choices, including: Daily care of your teeth and gums. Regular physical activity. Eating a healthy diet. Avoiding tobacco and drug use. Limiting alcohol use. Practicing safe sex. Taking low doses of aspirin every day. Taking vitamin and mineral supplements as recommended by your health care provider. What happens during an annual well check? The services and screenings done by your health care provider during your annual well check will depend on your age, overall health, lifestyle risk factors, and family history of disease. Counseling  Your health care provider may ask you questions about your: Alcohol use. Tobacco use. Drug use. Emotional well-being. Home and relationship well-being. Sexual activity. Eating habits. History of falls. Memory and ability to understand (cognition). Work and work  Statistician. Screening  You may have the following tests or measurements: Height, weight, and BMI. Blood pressure. Lipid and cholesterol levels. These may be checked every 5 years, or more frequently if you are over 70 years old. Skin check. Lung cancer screening. You may have this screening every year starting at age 13 if you have a 30-pack-year history of smoking and currently smoke or have quit within the past 15 years. Fecal occult blood test (FOBT) of the stool. You may have this test every year starting at age 2. Flexible sigmoidoscopy or colonoscopy. You may have a sigmoidoscopy every 5 years or a colonoscopy every 10 years starting at age 44. Prostate cancer screening. Recommendations will vary depending on your family history and other risks. Hepatitis C blood test. Hepatitis B blood test. Sexually transmitted disease (STD) testing. Diabetes screening. This is done by checking your blood sugar (glucose) after you have not eaten for a while (fasting). You may have this done every 1-3 years. Abdominal aortic aneurysm (AAA) screening. You may need this if you are a current or former smoker. Osteoporosis. You may be screened starting at age 46 if you are at high risk. Talk with your health care provider about your test results, treatment options, and if necessary, the need for more tests. Vaccines  Your health care provider may recommend certain vaccines, such as: Influenza vaccine. This is recommended every year. Tetanus, diphtheria, and acellular pertussis (Tdap, Td) vaccine. You may need a Td booster every 10 years. Zoster vaccine. You may need this after age 68. Pneumococcal 13-valent conjugate (PCV13) vaccine. One dose is recommended after age 63. Pneumococcal polysaccharide (PPSV23) vaccine. One dose is recommended after age 19. Talk to your health care provider about which screenings and vaccines you need and how often  you need them. This information is not intended to replace  advice given to you by your health care provider. Make sure you discuss any questions you have with your health care provider. Document Released: 08/25/2015 Document Revised: 04/17/2016 Document Reviewed: 05/30/2015 Elsevier Interactive Patient Education  2017 [place] Prevention in the Home Falls can cause injuries. They can happen to people of all ages. There are many things you can do to make your home safe and to help prevent falls. What can I do on the outside of my home? Regularly fix the edges of walkways and driveways and fix any cracks. Remove anything that might make you trip as you walk through a door, such as a raised step or threshold. Trim any bushes or trees on the path to your home. Use bright outdoor lighting. Clear any walking paths of anything that might make someone trip, such as rocks or tools. Regularly check to see if handrails are loose or broken. Make sure that both sides of any steps have handrails. Any raised decks and porches should have guardrails on the edges. Have any leaves, snow, or ice cleared regularly. Use sand or salt on walking paths during winter. Clean up any spills in your garage right away. This includes oil or grease spills. What can I do in the bathroom? Use night lights. Install grab bars by the toilet and in the tub and shower. Do not use towel bars as grab bars. Use non-skid mats or decals in the tub or shower. If you need to sit down in the shower, use a plastic, non-slip stool. Keep the floor dry. Clean up any water that spills on the floor as soon as it happens. Remove soap buildup in the tub or shower regularly. Attach bath mats securely with double-sided non-slip rug tape. Do not have throw rugs and other things on the floor that can make you trip. What can I do in the bedroom? Use night lights. Make sure that you have a light by your bed that is easy to reach. Do not use any sheets or blankets that are too big for your bed.  They should not hang down onto the floor. Have a firm chair that has side arms. You can use this for support while you get dressed. Do not have throw rugs and other things on the floor that can make you trip. What can I do in the kitchen? Clean up any spills right away. Avoid walking on wet floors. Keep items that you use a lot in easy-to-reach places. If you need to reach something above you, use a strong step stool that has a grab bar. Keep electrical cords out of the way. Do not use floor polish or wax that makes floors slippery. If you must use wax, use non-skid floor wax. Do not have throw rugs and other things on the floor that can make you trip. What can I do with my stairs? Do not leave any items on the stairs. Make sure that there are handrails on both sides of the stairs and use them. Fix handrails that are broken or loose. Make sure that handrails are as long as the stairways. Check any carpeting to make sure that it is firmly attached to the stairs. Fix any carpet that is loose or worn. Avoid having throw rugs at the top or bottom of the stairs. If you do have throw rugs, attach them to the floor with carpet tape. Make sure that you have a light  switch at the top of the stairs and the bottom of the stairs. If you do not have them, ask someone to add them for you. What else can I do to help prevent falls? Wear shoes that: Do not have high heels. Have rubber bottoms. Are comfortable and fit you well. Are closed at the toe. Do not wear sandals. If you use a stepladder: Make sure that it is fully opened. Do not climb a closed stepladder. Make sure that both sides of the stepladder are locked into place. Ask someone to hold it for you, if possible. Clearly mark and make sure that you can see: Any grab bars or handrails. First and last steps. Where the edge of each step is. Use tools that help you move around (mobility aids) if they are needed. These  include: Canes. Walkers. Scooters. Crutches. Turn on the lights when you go into a dark area. Replace any light bulbs as soon as they burn out. Set up your furniture so you have a clear path. Avoid moving your furniture around. If any of your floors are uneven, fix them. If there are any pets around you, be aware of where they are. Review your medicines with your doctor. Some medicines can make you feel dizzy. This can increase your chance of falling. Ask your doctor what other things that you can do to help prevent falls. This information is not intended to replace advice given to you by your health care provider. Make sure you discuss any questions you have with your health care provider. Document Released: 05/25/2009 Document Revised: 01/04/2016 Document Reviewed: 09/02/2014 Elsevier Interactive Patient Education  2017 [employer].

## 2022-04-05 ENCOUNTER — Ambulatory Visit: Payer: Medicare HMO | Admitting: Family Medicine

## 2022-04-09 DIAGNOSIS — C61 Malignant neoplasm of prostate: Secondary | ICD-10-CM | POA: Diagnosis not present

## 2022-04-09 DIAGNOSIS — N401 Enlarged prostate with lower urinary tract symptoms: Secondary | ICD-10-CM | POA: Diagnosis not present

## 2022-04-09 DIAGNOSIS — R3912 Poor urinary stream: Secondary | ICD-10-CM | POA: Diagnosis not present

## 2022-04-09 DIAGNOSIS — N281 Cyst of kidney, acquired: Secondary | ICD-10-CM | POA: Diagnosis not present

## 2022-04-11 ENCOUNTER — Telehealth: Payer: Self-pay | Admitting: Internal Medicine

## 2022-04-11 NOTE — Telephone Encounter (Signed)
Scheduled per 08/28 los, patient has been called and notified. 

## 2022-04-17 DIAGNOSIS — L814 Other melanin hyperpigmentation: Secondary | ICD-10-CM | POA: Diagnosis not present

## 2022-04-17 DIAGNOSIS — L821 Other seborrheic keratosis: Secondary | ICD-10-CM | POA: Diagnosis not present

## 2022-04-17 DIAGNOSIS — S80261A Insect bite (nonvenomous), right knee, initial encounter: Secondary | ICD-10-CM | POA: Diagnosis not present

## 2022-04-17 DIAGNOSIS — Z08 Encounter for follow-up examination after completed treatment for malignant neoplasm: Secondary | ICD-10-CM | POA: Diagnosis not present

## 2022-04-17 DIAGNOSIS — S50862A Insect bite (nonvenomous) of left forearm, initial encounter: Secondary | ICD-10-CM | POA: Diagnosis not present

## 2022-04-17 DIAGNOSIS — B351 Tinea unguium: Secondary | ICD-10-CM | POA: Diagnosis not present

## 2022-04-17 DIAGNOSIS — D225 Melanocytic nevi of trunk: Secondary | ICD-10-CM | POA: Diagnosis not present

## 2022-04-17 DIAGNOSIS — Z85828 Personal history of other malignant neoplasm of skin: Secondary | ICD-10-CM | POA: Diagnosis not present

## 2022-04-17 DIAGNOSIS — D492 Neoplasm of unspecified behavior of bone, soft tissue, and skin: Secondary | ICD-10-CM | POA: Diagnosis not present

## 2022-04-19 ENCOUNTER — Telehealth: Payer: Self-pay | Admitting: *Deleted

## 2022-04-19 NOTE — Telephone Encounter (Signed)
-----   Message from Bo Merino, MD sent at 03/28/2022  8:41 AM EDT ----- Dr. Melvyn Novas,  [name] was diagnosed with NSIP in 2020 based on high-resolution CT.  He had PFTs in 2020.  He states his shortness of breath is getting worse.  I ordered some autoimmune labs although he does not have any history of autoimmune disease.  I will wait to do the labs in a month as he had recent COVID-19 virus infection. He does not have a follow-up appointment with you.  Would you like for me to refer him to [facility] clinic or would you like to see him back in the office?  Thank you, Jairo Ben MD [facility] health rheumatology [address] [place], [place] [address] Phone [phone]

## 2022-04-19 NOTE — Telephone Encounter (Signed)
Reminded patient about coming to the office to have labs completed. Patient states he is having labs with Dr. [name] on 04/24/2022 and will see if he will be willing to draw these labs as well. If not patient states he will come to the office to have them completed.

## 2022-04-22 ENCOUNTER — Ambulatory Visit (INDEPENDENT_AMBULATORY_CARE_PROVIDER_SITE_OTHER): Payer: Medicare HMO | Admitting: Family Medicine

## 2022-04-22 ENCOUNTER — Encounter: Payer: Self-pay | Admitting: Family Medicine

## 2022-04-22 VITALS — BP 118/60 | HR 66 | Temp 98.1°F | Ht 67.0 in | Wt 161.0 lb

## 2022-04-22 DIAGNOSIS — J849 Interstitial pulmonary disease, unspecified: Secondary | ICD-10-CM | POA: Diagnosis not present

## 2022-04-22 DIAGNOSIS — R413 Other amnesia: Secondary | ICD-10-CM

## 2022-04-22 DIAGNOSIS — I25119 Atherosclerotic heart disease of native coronary artery with unspecified angina pectoris: Secondary | ICD-10-CM

## 2022-04-22 DIAGNOSIS — H9319 Tinnitus, unspecified ear: Secondary | ICD-10-CM

## 2022-04-22 DIAGNOSIS — H6122 Impacted cerumen, left ear: Secondary | ICD-10-CM | POA: Diagnosis not present

## 2022-04-22 DIAGNOSIS — H9190 Unspecified hearing loss, unspecified ear: Secondary | ICD-10-CM | POA: Diagnosis not present

## 2022-04-22 MED ORDER — AMLODIPINE BESYLATE 2.5 MG PO TABS: 2.5000 mg | ORAL_TABLET | Freq: Every day | ORAL | 1 refills | Status: DC

## 2022-04-22 NOTE — Patient Instructions (Addendum)
Continue follow up with pulmonary as planned, no change in symbicort for now.  Let me know if you would like to meet with memory specialist or if any new or worsening symptoms.  I will send in the new formulation for amlodipine. Same dose but do not have to split - take 1 pill per day.   I will refer you to ENT, ear specialist for the decreased hearing and ringing in the ears..  There is wax obstructing your left ear canal.  See information below.  Try Debrox over-the-counter if that is not effective return for office visit and we can help flush out the wax in office.  This can also be done at ear nose and throat specialist.  Earwax Buildup, Adult The ears produce a substance called earwax that helps keep bacteria out of the ear and protects the skin in the ear canal. Occasionally, earwax can build up in the ear and cause discomfort or hearing loss. What are the causes? This condition is caused by a buildup of earwax. Ear canals are self-cleaning. Ear wax is made in the outer part of the ear canal and generally falls out in small amounts over time. When the self-cleaning mechanism is not working, earwax builds up and can cause decreased hearing and discomfort. Attempting to clean ears with cotton swabs can push the earwax deep into the ear canal and cause decreased hearing and pain. What increases the risk? This condition is more likely to develop in people who: Clean their ears often with cotton swabs. Pick at their ears. Use earplugs or in-ear headphones often, or wear hearing aids. The following factors may also make you more likely to develop this condition: Being male. Being of older age. Naturally producing more earwax. Having narrow ear canals. Having earwax that is overly thick or sticky. Having excess hair in the ear canal. Having eczema. Being dehydrated. What are the signs or symptoms? Symptoms of this condition include: Reduced or muffled hearing. A feeling of fullness in the  ear or feeling that the ear is plugged. Fluid coming from the ear. Ear pain or an itchy ear. Ringing in the ear. Coughing. Balance problems. An obvious piece of earwax that can be seen inside the ear canal. How is this diagnosed? This condition may be diagnosed based on: Your symptoms. Your medical history. An ear exam. During the exam, your health care provider will look into your ear with an instrument called an otoscope. You may have tests, including a hearing test. How is this treated? This condition may be treated by: Using ear drops to soften the earwax. Having the earwax removed by a health care provider. The health care provider may: Flush the ear with water. Use an instrument that has a loop on the end (curette). Use a suction device. Having surgery to remove the wax buildup. This may be done in severe cases. Follow these instructions at home:  Take over-the-counter and prescription medicines only as told by your health care provider. Do not put any objects, including cotton swabs, into your ear. You can clean the opening of your ear canal with a washcloth or facial tissue. Follow instructions from your health care provider about cleaning your ears. Do not overclean your ears. Drink enough fluid to keep your urine pale yellow. This will help to thin the earwax. Keep all follow-up visits as told. If earwax builds up in your ears often or if you use hearing aids, consider seeing your health care provider for routine, preventive  ear cleanings. Ask your health care provider how often you should schedule your cleanings. If you have hearing aids, clean them according to instructions from the manufacturer and your health care provider. Contact a health care provider if: You have ear pain. You develop a fever. You have pus or other fluid coming from your ear. You have hearing loss. You have ringing in your ears that does not go away. You feel like the room is spinning  (vertigo). Your symptoms do not improve with treatment. Get help right away if: You have bleeding from the affected ear. You have severe ear pain. Summary Earwax can build up in the ear and cause discomfort or hearing loss. The most common symptoms of this condition include reduced or muffled hearing, a feeling of fullness in the ear, or feeling that the ear is plugged. This condition may be diagnosed based on your symptoms, your medical history, and an ear exam. This condition may be treated by using ear drops to soften the earwax or by having the earwax removed by a health care provider. Do not put any objects, including cotton swabs, into your ear. You can clean the opening of your ear canal with a washcloth or facial tissue. This information is not intended to replace advice given to you by your health care provider. Make sure you discuss any questions you have with your health care provider. Document Revised: 11/16/2019 Document Reviewed: 11/16/2019 [employer] Patient Education  [employer].   Tinnitus Tinnitus refers to hearing a sound when there is no actual source for that sound. This is often described as ringing in the ears. However, people with this condition may hear a variety of noises, in one ear or in both ears. The sounds of tinnitus can be soft, loud, or somewhere in between. Tinnitus can last for a few seconds or can be constant for days. It may go away without treatment and come back at various times. When tinnitus is constant or happens often, it can lead to other problems, such as trouble sleeping and trouble concentrating. Almost everyone experiences tinnitus at some point. Tinnitus is not the same as hearing loss. Tinnitus that is long-lasting (chronic) or comes back often (recurs) may require medical attention. What are the causes? The cause of tinnitus is often not known. In some cases, it can result from: Exposure to loud noises from machinery, music, or other  sources. An object (foreign body) stuck in the ear. Earwax buildup. Drinking alcohol or caffeine. Taking certain medicines. Age-related hearing loss. It may also be caused by medical conditions such as: Ear or sinus infections. Heart diseases or high blood pressure. Allergies. Mnire's disease. Thyroid problems. Tumors. A weak, bulging blood vessel (aneurysm) near the ear. What increases the risk? The following factors may make you more likely to develop this condition: Exposure to loud noises. Age. Tinnitus is more likely in older individuals. Using alcohol or tobacco. What are the signs or symptoms? The main symptom of tinnitus is hearing a sound when there is no source for that sound. It may sound like: Buzzing. Sizzling. Ringing. Blowing air. Hissing. Whistling. Other sounds may include: Roaring. Running water. A musical note. Tapping. Humming. Symptoms may affect only one ear (unilateral) or both ears (bilateral). How is this diagnosed? Tinnitus is diagnosed based on your symptoms, your medical history, and a physical exam. Your health care provider may do a thorough hearing test (audiologic exam) if your tinnitus: Is unilateral. Causes hearing difficulties. Lasts 6 months or longer.  You may work with a health care provider who specializes in hearing disorders (audiologist). You may be asked questions about your symptoms and how they affect your daily life. You may have other tests done, such as: CT scan. MRI. An imaging test of how blood flows through your blood vessels (angiogram). How is this treated? Treating an underlying medical condition can sometimes make tinnitus go away. If your tinnitus continues, other treatments may include: Therapy and counseling to help you manage the stress of living with tinnitus. Sound generators to mask the tinnitus. These include: Tabletop sound machines that play relaxing sounds to help you fall asleep. Wearable devices that  fit in your ear and play sounds or music. Acoustic neural stimulation. This involves using headphones to listen to music that contains an auditory signal. Over time, listening to this signal may change some pathways in your brain and make you less sensitive to tinnitus. This treatment is used for very severe cases when no other treatment is working. Using hearing aids or cochlear implants if your tinnitus is related to hearing loss. Hearing aids are worn in the outer ear. Cochlear implants are surgically placed in the inner ear. Follow these instructions at home: Managing symptoms     When possible, avoid being in loud places and being exposed to loud sounds. Wear hearing protection, such as earplugs, when you are exposed to loud noises. Use a white noise machine, a humidifier, or other devices to mask the sound of tinnitus. Practice techniques for reducing stress, such as meditation, yoga, or deep breathing. Work with your health care provider if you need help with managing stress. Sleep with your head slightly raised. This may reduce the impact of tinnitus. General instructions Do not use stimulants, such as nicotine, alcohol, or caffeine. Talk with your health care provider about other stimulants to avoid. Stimulants are substances that can make you feel alert and attentive by increasing certain activities in the body (such as heart rate and blood pressure). These substances may make tinnitus worse. Take over-the-counter and prescription medicines only as told by your health care provider. Try to get plenty of sleep each night. Keep all follow-up visits. This is important. Contact a health care provider if: Your tinnitus continues for 3 weeks or longer without stopping. You develop sudden hearing loss. Your symptoms get worse or do not get better with home care. You feel you are not able to manage the stress of living with tinnitus. Get help right away if: You develop tinnitus after a head  injury. You have tinnitus along with any of the following: Dizziness. Nausea and vomiting. Loss of balance. Sudden, severe headache. Vision changes. Facial weakness or weakness of arms or legs. These symptoms may represent a serious problem that is an emergency. Do not wait to see if the symptoms will go away. Get medical help right away. Call your local emergency services (911 in the [place] Do not drive yourself to the hospital. Summary Tinnitus refers to hearing a sound when there is no actual source for that sound. This is often described as ringing in the ears. Symptoms may affect only one ear (unilateral) or both ears (bilateral). Use a white noise machine, a humidifier, or other devices to mask the sound of tinnitus. Do not use stimulants, such as nicotine, alcohol, or caffeine. These substances may make tinnitus worse. This information is not intended to replace advice given to you by your health care provider. Make sure you discuss any questions you have with  your health care provider. Document Revised: 07/03/2020 Document Reviewed: 07/03/2020 Elsevier Patient Education  [place].

## 2022-04-22 NOTE — Progress Notes (Signed)
Subjective:  Patient ID: [name], male    DOB: 21-Jun-1941  Age: 81 y.o. MRN: [id number]  CC:  Chief Complaint  Patient presents with   Memory Loss    Follow-up in the next 4 to 6 weeks and we can discuss memory again   Leg Injury    Follow up on contusion on right lower leg, Pt states his leg has improved     HPI [name] presents for   Memory concerns Initially discussed in June his physical where he had reported less sharpness, short-term memory concerns over the previous year.  No impact on ADL, IADL and not forgetting faces or getting lost.  Prevagen supplement use.  B12 1486, normal TSH, MoCA scoring was reassuring at 27.  No change in memory symptoms at his July visit, we discussed eval by memory specialist but declined at that time.  About the same. Still some trouble remembering names, comes to him later, and no difficulty remembering faces. No other memory impacts. Still declines memory specialist eval at this time.   Tinnitus: Noticing more at night. Present for years. May be looking into military benefit.  No hearing aids, no recent hearing test. Would like check into further.   Hypertension: Has cut back to amlodipine 2.'5mg'$  when on paxlovid for covid few months ago. Still on 1/2 pill per day.  Home readings: BP Readings from Last 3 Encounters:  04/22/22 118/60  03/28/22 125/72  03/11/22 128/70   Lab Results  Component Value Date   CREATININE 0.93 02/07/2022    Right lower leg contusion Discussed at July visit.  Has improved.no bruising still small bump, but no pain. No pain to walk  Interstitial lung disease with COVID infection in July Still with persistent cough at his July visit, only using Symbicort needed that time, 2 puffs in the morning, worse cough in the evening.  Not needing albuterol at that time.Chest x-ray was stable without new infiltrate on July 31.  Recommended Symbicort twice daily dosing.  Taking 2 puffs BID Still slight  cough, phlegm at times, but better.  Will be meeting with new pulmonologist - [name] appt 9/19.  History Patient Active Problem List   Diagnosis Date Noted   Hypertrophy of prostate without urinary obstruction and other lower urinary tract symptoms (LUTS) 12/04/2021   Prostate cancer ([place]) 05/29/2021   GERD (gastroesophageal reflux disease) 12/15/2017   Unstable angina ([place]) 12/05/2017   Angina at rest [facility]) 12/05/2017   Atypical chest pain    Dyslipidemia    COPD not affecting current episode of care [facility])    Essential hypertension 05/29/2017   Abnormal nuclear cardiac imaging test 01/14/2017   Bronchiectasis without complication ([place]) 23/76/2831   ILD (interstitial lung disease) ([place]) 11/27/2016   Dizziness 10/21/2016   Cough variant asthma  vs UACS 07/22/2016   Obstructive sleep apnea 12/01/2015   Acute bronchitis 11/30/2015   Swelling of lower extremity 10/16/2015   Benign mole 02/04/2014   Impaired glucose tolerance 10/26/2013   NAFLD (nonalcoholic fatty liver disease) 04/27/2013   Thrombocytopenia ([place]) 10/21/2012   Exertional angina ([place]) 09/03/2012   MALARIA 12/05/2009   COLONIC POLYPS 12/05/2009   HYPERCHOLESTEROLEMIA 12/05/2009   ANXIETY 12/05/2009   HEMORRHOIDS 12/05/2009   Allergic rhinitis 12/05/2009   GANGLION CYST, WRIST, LEFT 12/05/2009   ROTATOR CUFF TEAR 12/05/2009   OTHER DISORDER OF MUSCLE LIGAMENT AND FASCIA 12/05/2009   ABNORMAL CHEST XRAY 12/05/2009   CAD (coronary artery disease) 04/05/2009   Past  Medical History:  Diagnosis Date   Abnormal chest x-ray    Allergic rhinitis    Allergy    Ankylosing spondylitis (HCC)    Anxiety    Arthritis    Colonic polyp    Coronary atherosclerosis of native coronary artery    Cough    Dizziness 10/21/2016   Ganglion cyst of wrist    left   Heart attack ([place]) 2008   Hemorrhoids    History of malaria    Hyperlipidemia    with low HDL   Hypertension    Kidney stone    Other disorder of muscle,  ligament, and fascia    Rotator cuff tear    Swelling of lower extremity 10/16/2015   Past Surgical History:  Procedure Laterality Date   bicep rupture     CORONARY ANGIOPLASTY WITH STENT PLACEMENT     HEMORRHOID SURGERY  05/2009   in [place]   LEFT HEART CATH AND CORONARY ANGIOGRAPHY N/A 01/14/2017   Procedure: Left Heart Cath and Coronary Angiography;  Surgeon: [name], MD;  Location: [facility];  Service: Cardiovascular;  Laterality: N/A;   LEFT HEART CATHETERIZATION WITH CORONARY ANGIOGRAM N/A 09/03/2012   Procedure: LEFT HEART CATHETERIZATION WITH CORONARY ANGIOGRAM;  Surgeon: [name], MD;  Location: [facility] CATH LAB;  Service: Cardiovascular;  Laterality: N/A;   PROSTATE BIOPSY     ROTATOR CUFF REPAIR     bilat by Dr. French Ana   Allergies  Allergen Reactions   Celebrex [Celecoxib]     Pt states precipitated his heart attack.   Tramadol Other (See Comments)    Makes patient jumpy   Prior to Admission medications   Medication Sig Start Date End Date Taking? Authorizing Provider  amLODipine (NORVASC) 5 MG tablet TAKE 1 TABLET EVERY DAY 04/01/22  Yes [name], MD  Apoaequorin (PREVAGEN PO) Take 1 tablet by mouth daily.    Yes [provider]  aspirin 81 MG EC tablet Take 81 mg by mouth daily.   Yes [provider]  budesonide-formoterol (SYMBICORT) 80-4.5 MCG/ACT inhaler Inhale 2 puffs into the lungs 2 (two) times daily. 03/11/22  Yes [name], MD  chlorpheniramine (CHLOR-TRIMETON) 4 MG tablet 1 -2 every 4 hours as needed 08/14/20  Yes [name], MD  doxazosin (CARDURA) 1 MG tablet TAKE 1 TABLET (1 MG TOTAL) BY MOUTH DAILY. 04/01/22  Yes [name], MD  famotidine (PEPCID) 20 MG tablet Take 20 mg by mouth at bedtime.   Yes [provider]  famotidine (PEPCID) 20 MG tablet Take by mouth.   Yes [provider]  Multiple Vitamins-Minerals (ICAPS AREDS 2) CAPS Take by mouth daily.   Yes [provider]   pantoprazole (PROTONIX) 40 MG tablet TAKE 1 TABLET 1 TIME DAILY 30 TO 60 MINUTES BEFORE FIRST MEAL OF THE DAY 11/23/21  Yes [name], MD  simvastatin (ZOCOR) 20 MG tablet TAKE 1 TABLET EVERY DAY WITH BREAKFAST (APPOINTMENT IS NEEDED FOR REFILLS) 12/19/21  Yes [name], MD  UNABLE TO FIND Med Name: CPAP per Dr Halford Chessman   Yes [provider]  albuterol (VENTOLIN HFA) 108 (90 Base) MCG/ACT inhaler Inhale into the lungs every 6 (six) hours as needed for wheezing or shortness of breath. Patient not taking: Reported on 04/22/2022    [provider]  benzonatate (TESSALON) 100 MG capsule Take 1 capsule (100 mg total) by mouth 3 (three) times daily as needed for cough. Patient not taking: Reported on 04/22/2022  02/25/22   [name], MD  ondansetron (ZOFRAN-ODT) 4 MG disintegrating tablet Take 1 tablet (4 mg total) by mouth every 8 (eight) hours as needed for nausea or vomiting. Patient not taking: Reported on 04/03/2022 02/25/22   [name], MD  predniSONE (DELTASONE) 20 MG tablet Take 2 tablets (40 mg total) by mouth daily with breakfast. Patient not taking: Reported on 03/28/2022 02/25/22   [name], MD   Social History   Socioeconomic History   Marital status: Married    Spouse name: [name]   Number of children: 2   Years of education: Not on file   Highest education level: Not on file  Occupational History   Not on file  Tobacco Use   Smoking status: Never    Passive exposure: Never   Smokeless tobacco: Never  Vaping Use   Vaping Use: Never used  Substance and Sexual Activity   Alcohol use: Yes    Comment: holiday Rare    Drug use: No   Sexual activity: Not Currently  Other Topics Concern   Not on file  Social History Narrative   Not on file   Social Determinants of Health   Financial Resource Strain: Low Risk  (04/03/2022)   Overall Financial Resource Strain (CARDIA)    Difficulty of Paying Living Expenses: Not hard at all  Food  Insecurity: No Food Insecurity (04/03/2022)   Hunger Vital Sign    Worried About Running Out of Food in the Last Year: Never true    Ran Out of Food in the Last Year: Never true  Transportation Needs: No Transportation Needs (04/03/2022)   PRAPARE - Hydrologist (Medical): No    Lack of Transportation (Non-Medical): No  Physical Activity: Inactive (04/03/2022)   Exercise Vital Sign    Days of Exercise per Week: 0 days    Minutes of Exercise per Session: 0 min  Stress: No Stress Concern Present (04/03/2022)   [place]    Feeling of Stress : Not at all  Social Connections: Moderately Integrated (04/03/2022)   Social Connection and Isolation Panel [NHANES]    Frequency of Communication with Friends and Family: Three times a week    Frequency of Social Gatherings with Friends and Family: Three times a week    Attends Religious Services: Never    Active Member of Clubs or Organizations: Yes    Attends Archivist Meetings: More than 4 times per year    Marital Status: Married  Human resources officer Violence: Not At Risk (04/03/2022)   Humiliation, Afraid, Rape, and Kick questionnaire    Fear of Current or Ex-Partner: No    Emotionally Abused: No    Physically Abused: No    Sexually Abused: No    Review of Systems Per HPI.   Objective:   Vitals:   04/22/22 0954  BP: 118/60  Pulse: 66  Temp: 98.1 F (36.7 C)  SpO2: 93%  Weight: 161 lb (73 kg)  Height: '5\' 7"'$  (1.702 m)     Physical Exam Vitals reviewed.  Constitutional:      Appearance: He is well-developed.  HENT:     Head: Normocephalic and atraumatic.     Right Ear: Tympanic membrane, ear canal and external ear normal.     Left Ear: External ear normal. There is impacted cerumen (Obstructed on left with dark yellow cerumen.).  Neck:     Vascular: No carotid bruit  or JVD.  Cardiovascular:     Rate and Rhythm: Normal rate  and regular rhythm.     Heart sounds: Normal heart sounds. No murmur heard. Pulmonary:     Effort: No respiratory distress.     Breath sounds: Rales (Coarse breath sounds bases bilaterally, speaking full sentences, no respiratory distress, no wheeze.) present.  Musculoskeletal:     Right lower leg: No edema.     Left lower leg: No edema.  Skin:    General: Skin is warm and dry.  Neurological:     Mental Status: He is alert and oriented to person, place, and time.  Psychiatric:        Mood and Affect: Mood normal.        Assessment & Plan:  DAMARIOUS HOLTSCLAW is a 81 y.o. male . ILD (interstitial lung disease) (HCC)  -Overall stable, continue same dose of Symbicort, follow-up with pulmonary as planned.  Memory difficulty  -Based on description of symptoms may be typical component of aging, option to meet with memory specialist declined at this time.  Coronary artery disease involving native coronary artery of native heart with angina pectoris ([place]) - Plan: amLODipine (NORVASC) 2.5 MG tablet  -Continue same dose amlodipine, changed formulation to 2.5 mg tablet to minimize pill splitting.  Tinnitus, unspecified laterality - Plan: Ambulatory referral to ENT Hearing loss, unspecified hearing loss type, unspecified laterality - Plan: Ambulatory referral to ENT  -Likely component of hearing loss with tinnitus.  Referral to ENT for evaluation including audiology eval likely initially.  Impacted cerumen of left ear  -Option of cerumen disimpaction, will try Debrox at home initially.  Handout given.  Meds ordered this encounter  Medications   amLODipine (NORVASC) 2.5 MG tablet    Sig: Take 1 tablet (2.5 mg total) by mouth daily.    Dispense:  90 tablet    Refill:  1   Patient Instructions  Continue follow up with pulmonary as planned, no change in symbicort for now.  Let me know if you would like to meet with memory specialist or if any new or worsening symptoms.  I will send in  the new formulation for amlodipine. Same dose but do not have to split - take 1 pill per day.   I will refer you to ENT, ear specialist for the decreased hearing and ringing in the ears..  There is wax obstructing your left ear canal.  See information below.  Try Debrox over-the-counter if that is not effective return for office visit and we can help flush out the wax in office.  This can also be done at ear nose and throat specialist.  Earwax Buildup, Adult The ears produce a substance called earwax that helps keep bacteria out of the ear and protects the skin in the ear canal. Occasionally, earwax can build up in the ear and cause discomfort or hearing loss. What are the causes? This condition is caused by a buildup of earwax. Ear canals are self-cleaning. Ear wax is made in the outer part of the ear canal and generally falls out in small amounts over time. When the self-cleaning mechanism is not working, earwax builds up and can cause decreased hearing and discomfort. Attempting to clean ears with cotton swabs can push the earwax deep into the ear canal and cause decreased hearing and pain. What increases the risk? This condition is more likely to develop in people who: Clean their ears often with cotton swabs. Pick at their ears. Use earplugs  or in-ear headphones often, or wear hearing aids. The following factors may also make you more likely to develop this condition: Being male. Being of older age. Naturally producing more earwax. Having narrow ear canals. Having earwax that is overly thick or sticky. Having excess hair in the ear canal. Having eczema. Being dehydrated. What are the signs or symptoms? Symptoms of this condition include: Reduced or muffled hearing. A feeling of fullness in the ear or feeling that the ear is plugged. Fluid coming from the ear. Ear pain or an itchy ear. Ringing in the ear. Coughing. Balance problems. An obvious piece of earwax that can be seen  inside the ear canal. How is this diagnosed? This condition may be diagnosed based on: Your symptoms. Your medical history. An ear exam. During the exam, your health care provider will look into your ear with an instrument called an otoscope. You may have tests, including a hearing test. How is this treated? This condition may be treated by: Using ear drops to soften the earwax. Having the earwax removed by a health care provider. The health care provider may: Flush the ear with water. Use an instrument that has a loop on the end (curette). Use a suction device. Having surgery to remove the wax buildup. This may be done in severe cases. Follow these instructions at home:  Take over-the-counter and prescription medicines only as told by your health care provider. Do not put any objects, including cotton swabs, into your ear. You can clean the opening of your ear canal with a washcloth or facial tissue. Follow instructions from your health care provider about cleaning your ears. Do not overclean your ears. Drink enough fluid to keep your urine pale yellow. This will help to thin the earwax. Keep all follow-up visits as told. If earwax builds up in your ears often or if you use hearing aids, consider seeing your health care provider for routine, preventive ear cleanings. Ask your health care provider how often you should schedule your cleanings. If you have hearing aids, clean them according to instructions from the manufacturer and your health care provider. Contact a health care provider if: You have ear pain. You develop a fever. You have pus or other fluid coming from your ear. You have hearing loss. You have ringing in your ears that does not go away. You feel like the room is spinning (vertigo). Your symptoms do not improve with treatment. Get help right away if: You have bleeding from the affected ear. You have severe ear pain. Summary Earwax can build up in the ear and cause  discomfort or hearing loss. The most common symptoms of this condition include reduced or muffled hearing, a feeling of fullness in the ear, or feeling that the ear is plugged. This condition may be diagnosed based on your symptoms, your medical history, and an ear exam. This condition may be treated by using ear drops to soften the earwax or by having the earwax removed by a health care provider. Do not put any objects, including cotton swabs, into your ear. You can clean the opening of your ear canal with a washcloth or facial tissue. This information is not intended to replace advice given to you by your health care provider. Make sure you discuss any questions you have with your health care provider. Document Revised: 11/16/2019 Document Reviewed: 11/16/2019 Elsevier Patient Education  [place].   Tinnitus Tinnitus refers to hearing a sound when there is no actual source for that  sound. This is often described as ringing in the ears. However, people with this condition may hear a variety of noises, in one ear or in both ears. The sounds of tinnitus can be soft, loud, or somewhere in between. Tinnitus can last for a few seconds or can be constant for days. It may go away without treatment and come back at various times. When tinnitus is constant or happens often, it can lead to other problems, such as trouble sleeping and trouble concentrating. Almost everyone experiences tinnitus at some point. Tinnitus is not the same as hearing loss. Tinnitus that is long-lasting (chronic) or comes back often (recurs) may require medical attention. What are the causes? The cause of tinnitus is often not known. In some cases, it can result from: Exposure to loud noises from machinery, music, or other sources. An object (foreign body) stuck in the ear. Earwax buildup. Drinking alcohol or caffeine. Taking certain medicines. Age-related hearing loss. It may also be caused by medical conditions such  as: Ear or sinus infections. Heart diseases or high blood pressure. Allergies. Mnire's disease. Thyroid problems. Tumors. A weak, bulging blood vessel (aneurysm) near the ear. What increases the risk? The following factors may make you more likely to develop this condition: Exposure to loud noises. Age. Tinnitus is more likely in older individuals. Using alcohol or tobacco. What are the signs or symptoms? The main symptom of tinnitus is hearing a sound when there is no source for that sound. It may sound like: Buzzing. Sizzling. Ringing. Blowing air. Hissing. Whistling. Other sounds may include: Roaring. Running water. A musical note. Tapping. Humming. Symptoms may affect only one ear (unilateral) or both ears (bilateral). How is this diagnosed? Tinnitus is diagnosed based on your symptoms, your medical history, and a physical exam. Your health care provider may do a thorough hearing test (audiologic exam) if your tinnitus: Is unilateral. Causes hearing difficulties. Lasts 6 months or longer. You may work with a health care provider who specializes in hearing disorders (audiologist). You may be asked questions about your symptoms and how they affect your daily life. You may have other tests done, such as: CT scan. MRI. An imaging test of how blood flows through your blood vessels (angiogram). How is this treated? Treating an underlying medical condition can sometimes make tinnitus go away. If your tinnitus continues, other treatments may include: Therapy and counseling to help you manage the stress of living with tinnitus. Sound generators to mask the tinnitus. These include: Tabletop sound machines that play relaxing sounds to help you fall asleep. Wearable devices that fit in your ear and play sounds or music. Acoustic neural stimulation. This involves using headphones to listen to music that contains an auditory signal. Over time, listening to this signal may change  some pathways in your brain and make you less sensitive to tinnitus. This treatment is used for very severe cases when no other treatment is working. Using hearing aids or cochlear implants if your tinnitus is related to hearing loss. Hearing aids are worn in the outer ear. Cochlear implants are surgically placed in the inner ear. Follow these instructions at home: Managing symptoms     When possible, avoid being in loud places and being exposed to loud sounds. Wear hearing protection, such as earplugs, when you are exposed to loud noises. Use a white noise machine, a humidifier, or other devices to mask the sound of tinnitus. Practice techniques for reducing stress, such as meditation, yoga, or deep breathing. Work with  your health care provider if you need help with managing stress. Sleep with your head slightly raised. This may reduce the impact of tinnitus. General instructions Do not use stimulants, such as nicotine, alcohol, or caffeine. Talk with your health care provider about other stimulants to avoid. Stimulants are substances that can make you feel alert and attentive by increasing certain activities in the body (such as heart rate and blood pressure). These substances may make tinnitus worse. Take over-the-counter and prescription medicines only as told by your health care provider. Try to get plenty of sleep each night. Keep all follow-up visits. This is important. Contact a health care provider if: Your tinnitus continues for 3 weeks or longer without stopping. You develop sudden hearing loss. Your symptoms get worse or do not get better with home care. You feel you are not able to manage the stress of living with tinnitus. Get help right away if: You develop tinnitus after a head injury. You have tinnitus along with any of the following: Dizziness. Nausea and vomiting. Loss of balance. Sudden, severe headache. Vision changes. Facial weakness or weakness of arms or  legs. These symptoms may represent a serious problem that is an emergency. Do not wait to see if the symptoms will go away. Get medical help right away. Call your local emergency services (911 in the [place] Do not drive yourself to the hospital. Summary Tinnitus refers to hearing a sound when there is no actual source for that sound. This is often described as ringing in the ears. Symptoms may affect only one ear (unilateral) or both ears (bilateral). Use a white noise machine, a humidifier, or other devices to mask the sound of tinnitus. Do not use stimulants, such as nicotine, alcohol, or caffeine. These substances may make tinnitus worse. This information is not intended to replace advice given to you by your health care provider. Make sure you discuss any questions you have with your health care provider. Document Revised: 07/03/2020 Document Reviewed: 07/03/2020 Elsevier Patient Education  2023 [place],   Merri Ray, MD [place], [facility] 04/22/22 10:22 AM

## 2022-04-24 ENCOUNTER — Other Ambulatory Visit: Payer: Medicare Other

## 2022-04-24 ENCOUNTER — Ambulatory Visit: Payer: Medicare Other | Admitting: Internal Medicine

## 2022-04-24 ENCOUNTER — Other Ambulatory Visit: Payer: Self-pay

## 2022-04-24 DIAGNOSIS — J849 Interstitial pulmonary disease, unspecified: Secondary | ICD-10-CM

## 2022-04-30 ENCOUNTER — Ambulatory Visit: Payer: Medicare HMO | Admitting: Internal Medicine

## 2022-04-30 ENCOUNTER — Encounter: Payer: Self-pay | Admitting: Internal Medicine

## 2022-04-30 ENCOUNTER — Telehealth: Payer: Self-pay | Admitting: Internal Medicine

## 2022-04-30 VITALS — BP 126/74 | HR 64 | Temp 97.6°F | Ht 67.0 in | Wt 160.8 lb

## 2022-04-30 DIAGNOSIS — J849 Interstitial pulmonary disease, unspecified: Secondary | ICD-10-CM | POA: Diagnosis not present

## 2022-04-30 DIAGNOSIS — R053 Chronic cough: Secondary | ICD-10-CM

## 2022-04-30 NOTE — Progress Notes (Signed)
OV 04/30/2022 -transferring from Dr. Christinia Gully to [name] for ILD.  Subjective:  Patient ID: [name], male , DOB: 1940/09/03 , age 81 y.o. , MRN: [id number] , ADDRESS: [place] [id number] PCP [name], MD Patient Care Team: [name], MD as PCP - General (Family Medicine) [name], MD as PCP - Cardiology (Cardiology)  This Provider for this visit: Treatment Team:  Attending Provider: Brand Males, MD    04/30/2022 -   Chief Complaint  Patient presents with   Consult    Dry cough and wheezing at times      HPI [name] 81 y.o. - referred by DR Bo Merino over concerns of ILD.  He saw rheumatology Dr. Lorelee Cover on 03/28/2022.  This is because of stiffness in his joints neck and back..  Diagnosed with degenerative disc disease and osteoarthritis.  In July 2023 he got COVID 19.  Treated as outpatient.  After that he started having some shortness of breath.  When rheumatology reviewed this history in August 2023 they note his previous reports of ILD on CT scan of the chest particularly high-res April 2018.  Last pulmonary function test was in December 2020.  Autoimmune labs were ordered.  This is come back normal and this referral was made.  Therefore is here.  To me he is reporting only cough.  He has chronic cough for the last 5 years.  There are some wheezing with a cough.  He does cough at night he does bring up some sputum that is clear or light yellow.  For the last 1 week his voice is strained.  He has never had hemoptysis.  He does cough when he lies down.  It does affect his voice.  He does clear the throat.  There is no tickle in the back of the throat.  There is barely any shortness of breath except   [place] Comprehensive ILD Questionnaire  Symptoms:  Reports chronic cough for the last 5 years. SYMPTOM SCALE - ILD 04/30/2022  Current weight   O2 use ra  Shortness of Breath 0 ->  5 scale with 5 being worst (score 6 If unable to do)  At rest 0  Simple tasks - showers, clothes change, eating, shaving 0  Household (dishes, doing bed, laundry) 0  Shopping 0  Walking level at own pace 0  Walking up Stairs 0  Total (30-36) Dyspnea Score 0      Non-dyspnea symptoms (0-> 5 scale) 04/30/2022  How bad is your cough? 2  How bad is your fatigue 2  How bad is nausea 0  How bad is vomiting?  0  How bad is diarrhea? 0  How bad is anxiety? 0  How bad is depression 0  Any chronic pain - if so where and how bad 2     Past Medical History :  = He has a history of prostate cancer that is early - He has seen Dr. Melvern Sample for low platelets and apparently this resolved - MI in 2008 and status post stents - He has chronic cough and is on PPI and H2 blockade.  But he says he does not have any active acid reflux - She did check he has for rheumatoid arthritis but rheumatology notes indicate DJD - He does have sleep apnea uses CPAP. - She had infectious mononucleosis in college in 1960s - She did mark positive for COVID vaccine and COVID  disease in July 2023.  He was not hospitalized for COVID   ROS:  -Positive for fatigue - Positive for arthralgia - 10 pound unintentional weight loss in the last 6 months because of wife cardiac illness.  But he is feeling better because of this - Nonspecific rash  FAMILY HISTORY of LUNG DISEASE:  -Both parents had COPD.  Mother had asthma.  Son at sarcoid.  Rest of family history is negative.*  PERSONAL EXPOSURE HISTORY:  -Never smoked cigarettes but he did do some passive smoking in the past.  No cigar use no marijuana use no pipe use.  No cocaine use no intravenous drug use  HOME  EXPOSURE and HOBBY DETAILS :  -Lives in a 79 year old home for the last 15 years.  He does have a [place] when he goes on vacation but this only once a year.  He does have a cockatiel bird in his office for the last 20 years.  He spent several hours in a week  in that office.  He also cleans the droppings and feathers from the bird cage.  There is no mold anywhere in the system.  He plays a Microbiologist harmonica there is no mold or mildew in this.  OCCUPATIONAL HISTORY (122 questions) : -He wants rental property.  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  Other than that he has a bird exposure.  -0 he also has a [place] War veteran and is believed to have been exposed to agent orange.  PULMONARY TOXICITY HISTORY (27 items):  -Denies  INVESTIGATIONS: Below Pulmonary function test shows progression between 30 teen and 2020  High-resolution CT chest from several years ago in 2018 nonspecific ILD changes but seems more prominent on the CT abdomen lung cuts August 2022.  [Personally visualized       Simple office walk 185 feet x  3 laps goal with forehead probe 04/30/2022    O2 used ra   Number laps completed 3   Comments about pace avg   Resting Pulse Ox/HR 98% and 75/min   Final Pulse Ox/HR 95% and 99/min   Desaturated </= 88% no   Desaturated <= 3% points yes   Got Tachycardic >/= 90/min yes   Symptoms at end of test non   Miscellaneous comments x      CT Chest data abd lung image Aug 2022  IMPRESSION: 1. No non-contrast CT findings of the abdomen or pelvis to explain right lower quadrant pain. No evidence of abdominal hernia. 2. Normal appendix. 3. No evidence of urinary tract calculus or hydronephrosis. 4. Mild prostatomegaly with thickening of the urinary bladder wall, likely related to chronic outlet obstruction. 5. Moderate fibrotic scarring of the included bilateral lung bases, which appears somewhat increased compared to prior examination 05/19/2017. Consider pulmonary referral and dedicated chest interstitial lung disease CT of the chest to further evaluate. 6. Coronary artery disease.   Aortic Atherosclerosis  (ICD10-I70.0).     Electronically Signed   By: Eddie Candle M.D.   On: 04/02/2021 13:05  No results found.   Latest Reference Range & Units 10/24/06 08:41 02/16/07 09:26 03/23/08 09:06 10/03/09 20:33 10/05/09 03:45 06/04/10 09:38 06/04/11 10:24 10/09/11 09:58 06/01/12 10:17 08/03/12 10:19 09/02/12 13:13 09/30/12 09:49 10/25/13 11:46 01/21/14 11:27 09/21/14 08:39 10/19/14 08:17 04/18/15 08:16 11/28/15 10:08 11/30/15 00:35 04/22/16 07:58 10/21/16 08:02 01/14/17 08:50 04/21/17 08:23 10/20/17 08:15  12/05/17 06:17 10/08/18 11:25 01/03/20 08:32 03/12/21 08:53 12/04/21 11:45 02/07/22 10:28  Creatinine 0.40 - 1.50 mg/dL 1.1 1.0 1.1 0.90 1.00 0.9 0.9 0.9 1.0 1.0 1.0 1.0 0.9 0.8 0.88 0.8 0.9 0.91 1.10 0.9 1.1 0.83 0.9 0.95 0.95 0.92 0.89 0.86 0.80 0.93    Latest Reference Range & Units 02/10/07 12:03 03/23/08 09:06 10/03/09 20:33 10/05/09 03:45 06/04/10 09:38 06/04/11 10:24 06/01/12 10:17 08/03/12 10:19 09/02/12 13:13 09/30/12 09:49 10/16/12 07:30 10/21/12 11:48 04/26/13 08:38 05/25/13 08:53 10/25/13 11:44 04/27/14 07:56 10/19/14 08:16 12/24/14 09:31 04/18/15 08:16 09/19/15 17:23 10/16/15 08:16 11/24/15 10:32 11/25/15 09:48 11/28/15 10:08 11/30/15 00:20 11/30/15 00:35 04/22/16 07:58 06/24/16 11:54 07/12/16 11:23 10/17/16 11:44 10/21/16 08:02 01/14/17 08:50 04/21/17 08:23 05/19/17 12:04 10/20/17 08:15 12/05/17 06:17 04/23/18 09:51 10/22/18 10:13 04/28/19 10:13 04/27/20 10:04 03/12/21 08:53 04/24/21 10:12 12/04/21 11:45  Hemoglobin 13.0 - 17.0 g/dL 14.2 13.9 15.8 QA FLAGS MODIFIED BY DEMOGRAPHIC UPDATE ON 02/22 AT 2139 13.4 13.2 14.1 14.8 14.4 14.8 13.5 14.9 14.5 14.6 14.5 14.8 13.8 14.5 15.1 14.5 14.5 14.4 14.6 14.2 14.1 13.2 14.3 14.3 14.8 14.9 15.7 16.5 14.4 15.5 15.8 15.1 14.6 15.4 15.0 15.1 14.8 13.7 13.8 13.4    PFT     Latest Ref Rng & Units 07/29/2019    8:58 AM 10/17/2016    9:59 AM  PFT Results  FVC-Pre L 2.57  2.73   FVC-Predicted Pre % 70  73   FVC-Post L 2.60  2.76   FVC-Predicted Post % 71   74   Pre FEV1/FVC % % 87  88   Post FEV1/FCV % % 90  87   FEV1-Pre L 2.23  2.39   FEV1-Predicted Pre % 85  89   FEV1-Post L 2.34  2.40   DLCO uncorrected ml/min/mmHg 15.24  16.71   DLCO UNC% % 68  60   DLCO corrected ml/min/mmHg  16.07   DLCO COR %Predicted %  58   DLVA Predicted % 98  90   TLC L 3.89  4.37   TLC % Predicted % 61  69   RV % Predicted % 53  59        has a past medical history of Abnormal chest x-ray, Allergic rhinitis, Allergy, Ankylosing spondylitis (HCC), Anxiety, Arthritis, Colonic polyp, Coronary atherosclerosis of native coronary artery, Cough, Dizziness (10/21/2016), Ganglion cyst of wrist, Heart attack ([place]) (2008), Hemorrhoids, History of malaria, Hyperlipidemia, Hypertension, Kidney stone, Other disorder of muscle, ligament, and fascia, Rotator cuff tear, and Swelling of lower extremity (10/16/2015).   reports that he has never smoked. He has never been exposed to tobacco smoke. He has never used smokeless tobacco.  Past Surgical History:  Procedure Laterality Date   bicep rupture     CORONARY ANGIOPLASTY WITH STENT PLACEMENT     HEMORRHOID SURGERY  05/2009   in [facility]   LEFT HEART CATH AND CORONARY ANGIOGRAPHY N/A 01/14/2017   Procedure: Left Heart Cath and Coronary Angiography;  Surgeon: [name], MD;  Location: [facility];  Service: Cardiovascular;  Laterality: N/A;   LEFT HEART CATHETERIZATION WITH CORONARY ANGIOGRAM N/A 09/03/2012   Procedure: LEFT HEART CATHETERIZATION WITH CORONARY ANGIOGRAM;  Surgeon: [name], MD;  Location: [facility] CATH [facility];  Service: Cardiovascular;  Laterality: N/A;   PROSTATE BIOPSY     ROTATOR CUFF REPAIR     bilat by Dr. French Ana    Allergies  Allergen Reactions   Celebrex [Celecoxib]     Pt states precipitated his heart attack.   Tramadol Other (  See Comments)    Makes patient jumpy    Immunization History  Administered Date(s) Administered   Fluad Quad(high Dose 65+) 05/01/2019, 05/28/2021   Influenza  Split 06/01/2012   Influenza Whole 05/30/2009, 04/20/2010   Influenza, High Dose Seasonal PF 05/19/2017, 05/19/2018   Influenza-Unspecified 05/13/2007, 07/12/2013, 05/10/2016, 06/23/2020   PFIZER(Purple Top)SARS-COV-2 Vaccination 09/18/2019, 10/09/2019, 07/13/2020   PNEUMOCOCCAL CONJUGATE-20 01/14/2022   Pneumococcal Conjugate-13 05/19/2018   Pneumococcal Polysaccharide-23 08/22/2009   Tdap 12/04/2010, 06/04/2011, 07/31/2018   Zoster Recombinat (Shingrix) 01/14/2022   Zoster, Live 05/17/2008   Zoster, Unspecified 04/17/2022    Family History  Problem Relation Age of Onset   Cancer Mother        melanoma   Emphysema Mother    Rheum arthritis Mother    Diabetes Mother    Alcohol abuse Father      Current Outpatient Medications:    amLODipine (NORVASC) 2.5 MG tablet, Take 1 tablet (2.5 mg total) by mouth daily., Disp: 90 tablet, Rfl: 1   Apoaequorin (PREVAGEN PO), Take 1 tablet by mouth daily. , Disp: , Rfl:    aspirin 81 MG EC tablet, Take 81 mg by mouth daily., Disp: , Rfl:    budesonide-formoterol (SYMBICORT) 80-4.5 MCG/ACT inhaler, Inhale 2 puffs into the lungs 2 (two) times daily., Disp: 1 each, Rfl: 3   doxazosin (CARDURA) 1 MG tablet, TAKE 1 TABLET (1 MG TOTAL) BY MOUTH DAILY., Disp: 90 tablet, Rfl: 1   famotidine (PEPCID) 20 MG tablet, Take 20 mg by mouth at bedtime., Disp: , Rfl:    Multiple Vitamins-Minerals (ICAPS AREDS 2) CAPS, Take by mouth daily., Disp: , Rfl:    pantoprazole (PROTONIX) 40 MG tablet, TAKE 1 TABLET 1 TIME DAILY 30 TO 60 MINUTES BEFORE FIRST MEAL OF THE DAY, Disp: 90 tablet, Rfl: 3   simvastatin (ZOCOR) 20 MG tablet, TAKE 1 TABLET EVERY DAY WITH BREAKFAST (APPOINTMENT IS NEEDED FOR REFILLS), Disp: 90 tablet, Rfl: 3   albuterol (VENTOLIN HFA) 108 (90 Base) MCG/ACT inhaler, Inhale into the lungs every 6 (six) hours as needed for wheezing or shortness of breath. (Patient not taking: Reported on 04/22/2022), Disp: , Rfl:    benzonatate (TESSALON) 100 MG  capsule, Take 1 capsule (100 mg total) by mouth 3 (three) times daily as needed for cough. (Patient not taking: Reported on 04/22/2022), Disp: 20 capsule, Rfl: 0   chlorpheniramine (CHLOR-TRIMETON) 4 MG tablet, 1 -2 every 4 hours as needed, Disp: , Rfl:    famotidine (PEPCID) 20 MG tablet, Take by mouth., Disp: , Rfl:    ondansetron (ZOFRAN-ODT) 4 MG disintegrating tablet, Take 1 tablet (4 mg total) by mouth every 8 (eight) hours as needed for nausea or vomiting. (Patient not taking: Reported on 04/03/2022), Disp: 10 tablet, Rfl: 0   predniSONE (DELTASONE) 20 MG tablet, Take 2 tablets (40 mg total) by mouth daily with breakfast. (Patient not taking: Reported on 03/28/2022), Disp: 6 tablet, Rfl: 0   UNABLE TO FIND, Med Name: CPAP per Dr Halford Chessman, Disp: , Rfl:       Objective:   Vitals:   04/30/22 0854  BP: 126/74  Pulse: 64  Temp: 97.6 F (36.4 C)  TempSrc: Oral  SpO2: 95%  Weight: 160 lb 12.8 oz (72.9 kg)  Height: '5\' 7"'$  (1.702 m)    Estimated body mass index is 25.18 kg/m as calculated from the following:   Height as of this encounter: '5\' 7"'$  (1.702 m).   Weight as of this encounter: 160 lb 12.8 oz (  72.9 kg).  '@WEIGHTCHANGE'$ @  [employer]   04/30/22 0854  Weight: 160 lb 12.8 oz (72.9 kg)     Physical Exam  General Appearance:    Alert, cooperative, no distress, appears stated age - yes , Deconditioned looking - no , OBESE  - no, Sitting on Wheelchair -  no  Head:    Normocephalic, without obvious abnormality, atraumatic  Eyes:    PERRL, conjunctiva/corneas clear,  Ears:    Normal TM's and external ear canals, both ears  Nose:   Nares normal, septum midline, mucosa normal, no drainage    or sinus tenderness. OXYGEN ON  - no . Patient is @ ra   Throat:   Lips, mucosa, and tongue normal; teeth and gums normal. Cyanosis on lips - no  Neck:   Supple, symmetrical, trachea midline, no adenopathy;    thyroid:  no enlargement/tenderness/nodules; no carotid   bruit or JVD  Back:      Symmetric, no curvature, ROM normal, no CVA tenderness  Lungs:     Distress - no , Wheeze no, Barrell Chest - no, Purse lip breathing - no, Crackles - YES BASE   Chest Wall:    No tenderness or deformity.    Heart:    Regular rate and rhythm, S1 and S2 normal, no rub   or gallop, Murmur - no  Breast Exam:    NOT DONE  Abdomen:     Soft, non-tender, bowel sounds active all four quadrants,    no masses, no organomegaly. Visceral obesity - no  Genitalia:   NOT DONE  Rectal:   NOT DONE  Extremities:   Extremities - normal, Has Cane - no, Clubbing - no, Edema - no  Pulses:   2+ and symmetric all extremities  Skin:   Stigmata of Connective Tissue Disease - NO  Lymph nodes:   Cervical, supraclavicular, and axillary nodes normal  Psychiatric:  Neurologic:   Pleasant - yes, Anxious - no, Flat affect - YES  CAm-ICU - neg, Alert and Oriented x 3 - yes, Moves all 4s - yes, Speech - normal, Cognition - intact. Answered all questions correctly and asked good questions . HAd good insight        Assessment:       ICD-10-CM   1. ILD (interstitial lung disease) (HCC)  J84.9 Pulmonary function test    CT Chest High Resolution    Hypersensitivity Pneumonitis    QuantiFERON-TB Gold Plus    QuantiFERON-TB Gold Plus    Hypersensitivity Pneumonitis    ECHOCARDIOGRAM COMPLETE    CANCELED: Pulse oximetry, overnight    2. Chronic cough  R05.3          Plan:     Patient Instructions     ICD-10-CM   1. ILD (interstitial lung disease) ([place])  J84.9     2. 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 or Chronic HP from bird expozure - To narrow down possibilities and assess severity please do the following tests  - do full PFT  - do walking test on room air in the office (not 6 min walk test)  - do High Resolution  CT chest wo contrast - supine and prone, inspiratory and expiratory images (only Dr Rosario Jacks  or Dr Weber Cooks or Dr Polly Cobia or Dr Laqueta Carina oir [place] to read)  - do autoimmune panel: Serum: Hypersensitivity Pneumonitis Panel and QUantiferon gold  - do ECHO  Followup  - DR Chase Caller -30 min in 6 weeks  ( Level 05 visit:  New 60-74 min   in  visit type: on-site physical face to visit  in total care time and counseling or/and coordination of care by this undersigned MD - Dr Brand Males. This includes one or more of the following on this same day 04/30/2022: pre-charting, chart review, note writing, documentation discussion of test results, diagnostic or treatment recommendations, prognosis, risks and benefits of management options, instructions, education, compliance or risk-factor reduction. It excludes time spent by the [place] or office staff in the care of the patient. Actual time 34 min)   SIGNATURE    Dr. Brand Males, M.D., F.C.C.P,  Pulmonary and Critical Care Medicine Staff Physician, [place] Director - Interstitial Lung Disease  Program  Pulmonary [place] [facility] [place], [place], [address]  Pager: [phone], If no answer or between  15:00h - 7:00h: call [phone] Telephone: [phone]  12:46 PM 04/30/2022

## 2022-04-30 NOTE — Patient Instructions (Addendum)
ILD (interstitial lung disease) (HCC) Long-term exposure involving bird droppings  - This is either IPF or chronic HP  - regardless it is progressive; 25% decline in lung function in 5 years  Plan   -Test overnight pulse oximetry on room air -I will get a second opinion on his CT scan of the chest from another radiologist -We discussed 3 therapeutic options (If dx is IPF) -and this includes nintedanib [diarrhea side effect] versus pirfenidone [nausea vomiting side effect] versus enrollment in a clinical trial with modified pirfenidone (lower side effect profile]  -Final decision depending on my discussions with Dr. Mohamed and radiology second opinion and also your reading of the research study consent form  -Regardless you do need to get rid of your bird   Chronic cough History of dust mite allergy 2018 Runny nose Epistaxis   -The chronic cough can be because of runny nose and dry nose -Also the bird exposure could be contributing to the cough  Plan  - Remove bird from the house = Refer ENT -Use saline nasal spray [buy it over-the-counter]  Unintentional weight loss   Follow-up - Video visit with Dr. Tamber Burtch in 2-4 weeks to discuss next steps  -This visit can be when I am in the hospital rotation 07/19/2022 

## 2022-04-30 NOTE — Telephone Encounter (Signed)
Patient called to ask the nurse to call regarding getting approval for his CT schedule for this Saturday.  He stated that they called and told him he needed to call the office to get approval.  Please call patient to discuss at [phone]

## 2022-05-01 ENCOUNTER — Ambulatory Visit (HOSPITAL_COMMUNITY)
Admission: RE | Admit: 2022-05-01 | Discharge: 2022-05-01 | Disposition: A | Discharge status: Home / Self Care | Payer: Medicare HMO | Source: Ambulatory Visit | Attending: Internal Medicine | Admitting: Internal Medicine

## 2022-05-01 DIAGNOSIS — Z8616 Personal history of COVID-19: Secondary | ICD-10-CM | POA: Diagnosis not present

## 2022-05-01 DIAGNOSIS — I2510 Atherosclerotic heart disease of native coronary artery without angina pectoris: Secondary | ICD-10-CM | POA: Insufficient documentation

## 2022-05-01 DIAGNOSIS — I10 Essential (primary) hypertension: Secondary | ICD-10-CM | POA: Insufficient documentation

## 2022-05-01 DIAGNOSIS — R0600 Dyspnea, unspecified: Secondary | ICD-10-CM | POA: Insufficient documentation

## 2022-05-01 DIAGNOSIS — R609 Edema, unspecified: Secondary | ICD-10-CM | POA: Insufficient documentation

## 2022-05-01 DIAGNOSIS — R0609 Other forms of dyspnea: Secondary | ICD-10-CM | POA: Diagnosis not present

## 2022-05-01 DIAGNOSIS — J849 Interstitial pulmonary disease, unspecified: Secondary | ICD-10-CM | POA: Diagnosis not present

## 2022-05-01 DIAGNOSIS — E785 Hyperlipidemia, unspecified: Secondary | ICD-10-CM | POA: Insufficient documentation

## 2022-05-01 DIAGNOSIS — I252 Old myocardial infarction: Secondary | ICD-10-CM | POA: Insufficient documentation

## 2022-05-01 LAB — ECHOCARDIOGRAM COMPLETE
Area-P 1/2: 2.76 cm2
S' Lateral: 3.4 cm

## 2022-05-01 NOTE — Telephone Encounter (Signed)
LM on pt's VM to advise Josem Kaufmann has been obtained.  Nothing further needed at this time.

## 2022-05-03 LAB — HYPERSENSITIVITY PNEUMONITIS
A. Pullulans Abs: NEGATIVE
A.Fumigatus #1 Abs: NEGATIVE
Micropolyspora faeni, IgG: NEGATIVE
Pigeon Serum Abs: NEGATIVE
Thermoact. Saccharii: NEGATIVE
Thermoactinomyces vulgaris, IgG: NEGATIVE

## 2022-05-04 ENCOUNTER — Ambulatory Visit (HOSPITAL_BASED_OUTPATIENT_CLINIC_OR_DEPARTMENT_OTHER)
Admission: RE | Admit: 2022-05-04 | Discharge: 2022-05-04 | Disposition: A | Discharge status: Home / Self Care | Payer: Medicare HMO | Source: Ambulatory Visit | Attending: Internal Medicine | Admitting: Internal Medicine

## 2022-05-04 DIAGNOSIS — J849 Interstitial pulmonary disease, unspecified: Secondary | ICD-10-CM | POA: Diagnosis not present

## 2022-05-04 DIAGNOSIS — J984 Other disorders of lung: Secondary | ICD-10-CM | POA: Diagnosis not present

## 2022-05-04 LAB — QUANTIFERON-TB GOLD PLUS
Mitogen-NIL: >10 IU/mL
NIL: 0.01 IU/mL
QuantiFERON-TB Gold Plus: NEGATIVE
TB1-NIL: 0 IU/mL
TB2-NIL: 0.01 IU/mL

## 2022-05-06 ENCOUNTER — Other Ambulatory Visit: Payer: Self-pay

## 2022-05-06 DIAGNOSIS — D696 Thrombocytopenia, unspecified: Secondary | ICD-10-CM

## 2022-05-07 ENCOUNTER — Inpatient Hospital Stay: Payer: Medicare HMO | Attending: Internal Medicine

## 2022-05-07 ENCOUNTER — Inpatient Hospital Stay: Payer: Medicare HMO | Admitting: Internal Medicine

## 2022-05-07 ENCOUNTER — Other Ambulatory Visit: Payer: Self-pay

## 2022-05-07 ENCOUNTER — Other Ambulatory Visit: Payer: Self-pay | Admitting: Internal Medicine

## 2022-05-07 ENCOUNTER — Encounter: Payer: Self-pay | Admitting: Internal Medicine

## 2022-05-07 VITALS — BP 127/60 | HR 70 | Temp 98.1°F | Resp 15 | Wt 161.8 lb

## 2022-05-07 DIAGNOSIS — D696 Thrombocytopenia, unspecified: Secondary | ICD-10-CM | POA: Insufficient documentation

## 2022-05-07 DIAGNOSIS — J479 Bronchiectasis, uncomplicated: Secondary | ICD-10-CM | POA: Diagnosis not present

## 2022-05-07 DIAGNOSIS — C61 Malignant neoplasm of prostate: Secondary | ICD-10-CM | POA: Insufficient documentation

## 2022-05-07 DIAGNOSIS — D72829 Elevated white blood cell count, unspecified: Secondary | ICD-10-CM

## 2022-05-07 LAB — CBC WITH DIFFERENTIAL (CANCER CENTER ONLY)
Abs Immature Granulocytes: 4.91 10*3/uL — ABNORMAL HIGH (ref 0.00–0.07)
Basophils Absolute: 0.2 10*3/uL — ABNORMAL HIGH (ref 0.0–0.1)
Basophils Relative: 1 %
Eosinophils Absolute: 0.1 10*3/uL (ref 0.0–0.5)
Eosinophils Relative: 0 %
HCT: 38.4 % — ABNORMAL LOW (ref 39.0–52.0)
Hemoglobin: 12.3 g/dL — ABNORMAL LOW (ref 13.0–17.0)
Immature Granulocytes: 18 %
Lymphocytes Relative: 5 %
Lymphs Abs: 1.4 10*3/uL (ref 0.7–4.0)
MCH: 29.2 pg (ref 26.0–34.0)
MCHC: 32 g/dL (ref 30.0–36.0)
MCV: 91.2 fL (ref 80.0–100.0)
Monocytes Absolute: 2 10*3/uL — ABNORMAL HIGH (ref 0.1–1.0)
Monocytes Relative: 7 %
Neutro Abs: 18.8 10*3/uL — ABNORMAL HIGH (ref 1.7–7.7)
Neutrophils Relative %: 69 %
Platelet Count: 282 10*3/uL (ref 150–400)
RBC: 4.21 MIL/uL — ABNORMAL LOW (ref 4.22–5.81)
RDW: 22.2 % — ABNORMAL HIGH (ref 11.5–15.5)
WBC Count: 27.5 10*3/uL — ABNORMAL HIGH (ref 4.0–10.5)
nRBC: 0.2 % (ref 0.0–0.2)

## 2022-05-07 LAB — CMP (CANCER CENTER ONLY)
ALT: 20 U/L (ref 0–44)
AST: 24 U/L (ref 15–41)
Albumin: 4.7 g/dL (ref 3.5–5.0)
Alkaline Phosphatase: 81 U/L (ref 38–126)
Anion gap: 7 (ref 5–15)
BUN: 20 mg/dL (ref 8–23)
CO2: 29 mmol/L (ref 22–32)
Calcium: 9.6 mg/dL (ref 8.9–10.3)
Chloride: 104 mmol/L (ref 98–111)
Creatinine: 0.96 mg/dL (ref 0.61–1.24)
GFR, Estimated: >60 mL/min (ref >60)
Glucose, Bld: 91 mg/dL (ref 70–99)
Potassium: 3.9 mmol/L (ref 3.5–5.1)
Sodium: 140 mmol/L (ref 135–145)
Total Bilirubin: 0.8 mg/dL (ref 0.3–1.2)
Total Protein: 7.6 g/dL (ref 6.5–8.1)

## 2022-05-07 LAB — LACTATE DEHYDROGENASE: LDH: 463 U/L — ABNORMAL HIGH (ref 98–192)

## 2022-05-07 MED ORDER — DOXYCYCLINE HYCLATE 100 MG PO TABS: 100.0000 mg | ORAL_TABLET | Freq: Two times a day (BID) | ORAL | 0 refills | Status: DC

## 2022-05-07 NOTE — Progress Notes (Signed)
[place] [phone]   Fax:([phone]  OFFICE PROGRESS NOTE  [name], MD [address] [place] [address]  DIAGNOSIS:  1) Thrombocytopenia, most likely idiopathic thrombocytopenic purpura. 2) prostate adenocarcinoma currently under the care of Dr. [name]  PRIOR THERAPY:None.  CURRENT THERAPY: observation.  INTERVAL HISTORY: [name] 81 y.o. male returns to the clinic today for follow-up visit.  The patient is feeling fine today with no concerning complaints except for mild cough.  He was seen recently by [name] for evaluation of interstitial lung disease and he had CT scan of the chest performed on 05/04/2022 and that showed pulmonary parenchymal pattern of fibrosis that has been progressive since 2018.  His spleen was enlarged but not completely imaged.  The patient denied having any current chest pain but has mild shortness of breath at baseline increased with exertion with no hemoptysis.  He lost several pounds in the last few months.  He has no nausea, vomiting, diarrhea or constipation.  He was here today for evaluation and repeat CBC, comprehensive metabolic panel and LDH.  MEDICAL HISTORY: Past Medical History:  Diagnosis Date   Abnormal chest x-ray    Allergic rhinitis    Allergy    Ankylosing spondylitis (HCC)    Anxiety    Arthritis    Colonic polyp    Coronary atherosclerosis of native coronary artery    Cough    Dizziness 10/21/2016   Ganglion cyst of wrist    left   Heart attack ([place]) 2008   Hemorrhoids    History of malaria    Hyperlipidemia    with low HDL   Hypertension    Kidney stone    Other disorder of muscle, ligament, and fascia    Rotator cuff tear    Swelling of lower extremity 10/16/2015    ALLERGIES:  is allergic to celebrex [celecoxib] and tramadol.  MEDICATIONS:  Current Outpatient Medications  Medication Sig Dispense Refill   amLODipine (NORVASC) 2.5 MG tablet Take 1  tablet (2.5 mg total) by mouth daily. 90 tablet 1   Apoaequorin (PREVAGEN PO) Take 1 tablet by mouth daily.      aspirin 81 MG EC tablet Take 81 mg by mouth daily.     budesonide-formoterol (SYMBICORT) 80-4.5 MCG/ACT inhaler Inhale 2 puffs into the lungs 2 (two) times daily. 1 each 3   chlorpheniramine (CHLOR-TRIMETON) 4 MG tablet 1 -2 every 4 hours as needed     doxazosin (CARDURA) 1 MG tablet TAKE 1 TABLET (1 MG TOTAL) BY MOUTH DAILY. 90 tablet 1   famotidine (PEPCID) 20 MG tablet Take by mouth.     ketoconazole (NIZORAL) 2 % cream Apply topically 2 (two) times daily.     Multiple Vitamins-Minerals (ICAPS AREDS 2) CAPS Take by mouth daily.     pantoprazole (PROTONIX) 40 MG tablet TAKE 1 TABLET 1 TIME DAILY 30 TO 60 MINUTES BEFORE FIRST MEAL OF THE DAY 90 tablet 3   simvastatin (ZOCOR) 20 MG tablet TAKE 1 TABLET EVERY DAY WITH BREAKFAST (APPOINTMENT IS NEEDED FOR REFILLS) 90 tablet 3   UNABLE TO FIND Med Name: CPAP per Dr Halford Chessman     No current facility-administered medications for this visit.    SURGICAL HISTORY:  Past Surgical History:  Procedure Laterality Date   bicep rupture     CORONARY ANGIOPLASTY WITH STENT PLACEMENT     HEMORRHOID SURGERY  05/2009   in [place]  LEFT HEART CATH AND CORONARY ANGIOGRAPHY N/A 01/14/2017   Procedure: Left Heart Cath and Coronary Angiography;  Surgeon: [name], MD;  Location: [facility];  Service: Cardiovascular;  Laterality: N/A;   LEFT HEART CATHETERIZATION WITH CORONARY ANGIOGRAM N/A 09/03/2012   Procedure: LEFT HEART CATHETERIZATION WITH CORONARY ANGIOGRAM;  Surgeon: [name], MD;  Location: [facility];  Service: Cardiovascular;  Laterality: N/A;   PROSTATE BIOPSY     ROTATOR CUFF REPAIR     bilat by Dr. French Ana    REVIEW OF SYSTEMS:  Constitutional: positive for fatigue and weight loss Eyes: negative Ears, nose, mouth, throat, and face: negative Respiratory: positive for cough and dyspnea on exertion Cardiovascular:  negative Gastrointestinal: negative Genitourinary:negative Integument/breast: negative Hematologic/lymphatic: negative Musculoskeletal:negative Neurological: negative Behavioral/Psych: negative Endocrine: negative Allergic/Immunologic: negative   PHYSICAL EXAMINATION: General appearance: alert, cooperative, fatigued, and no distress Head: Normocephalic, without obvious abnormality, atraumatic Neck: no adenopathy Lymph nodes: Cervical, supraclavicular, and axillary nodes normal. Resp: clear to auscultation bilaterally Back: symmetric, no curvature. ROM normal. No CVA tenderness. Cardio: regular rate and rhythm, S1, S2 normal, no murmur, click, rub or gallop GI: soft, non-tender; bowel sounds normal; no masses,  no organomegaly Extremities: extremities normal, atraumatic, no cyanosis or edema Neurologic: Alert and oriented X 3, normal strength and tone. Normal symmetric reflexes. Normal coordination and gait  ECOG PERFORMANCE STATUS: 1 - Symptomatic but completely ambulatory  Blood pressure 127/60, pulse 70, temperature 98.1 F (36.7 C), temperature source Oral, resp. rate 15, weight 161 lb 12.8 oz (73.4 kg), SpO2 93 %.  LABORATORY DATA: Lab Results  Component Value Date   WBC 27.5 (H) 05/07/2022   HGB 12.3 (L) 05/07/2022   HCT 38.4 (L) 05/07/2022   MCV 91.2 05/07/2022   PLT 282 05/07/2022      Chemistry      Component Value Date/Time   NA 141 02/07/2022 1028   NA 141 01/03/2020 0832   NA 141 04/21/2017 0823   K 4.5 02/07/2022 1028   K 4.0 04/21/2017 0823   CL 102 02/07/2022 1028   CL 106 09/30/2012 0949   CO2 32 02/07/2022 1028   CO2 25 04/21/2017 0823   BUN 20 02/07/2022 1028   BUN 20 01/03/2020 0832   BUN 20.2 04/21/2017 0823   CREATININE 0.93 02/07/2022 1028   CREATININE 0.9 04/21/2017 0823      Component Value Date/Time   CALCIUM 9.9 02/07/2022 1028   CALCIUM 9.6 04/21/2017 0823   ALKPHOS 69 02/07/2022 1028   ALKPHOS 61 04/21/2017 0823   AST 23  02/07/2022 1028   AST 22 04/21/2017 0823   ALT 17 02/07/2022 1028   ALT 36 04/21/2017 0823   BILITOT 0.8 02/07/2022 1028   BILITOT 0.6 01/03/2020 0832   BILITOT 0.99 04/21/2017 0823     BONE MARROW REPORT FINAL DIAGNOSIS RADIOGRAPHIC STUDIES: No results found.  ASSESSMENT AND PLAN:  This is a very pleasant 81  years old white male with idiopathic thrombocytopenic purpura. The patient has been in observation and usually seen on annual basis for his thrombocytopenia and his platelet count has been in the normal range over the last several years. He had repeat CBC today that interestingly showed significant elevation of his total white blood count to 27.5 with absolute neutrophil count of 18,800 and absolute monocyte count of 2000.  The patient also has anemia with hemoglobin of 12.3 and hematocrit 38.4.  His LDH is significantly elevated at 463. These findings in addition to the splenomegaly  seen on the recent scan are concerning for me with the possibility of underlying myeloproliferative disorder especially chronic myeloid leukemia but reactive leukocytosis could not be completely excluded. I will start the patient empirically on doxycycline 100 mg p.o. twice daily for 1 week.  I will repeat his blood count as well as FISH study for BCR/ABL next week if he continues to have persistent leukocytosis. The patient will come back for follow-up visit at that time. He was advised to call immediately if he has any other concerning symptoms in the interval. All questions were answered. The patient knows to call the clinic with any problems, questions or concerns. We can certainly see the patient much sooner if necessary.  Disclaimer: This note was dictated with voice recognition software. Similar sounding words can inadvertently be transcribed and may not be corrected upon review.

## 2022-05-08 ENCOUNTER — Encounter: Payer: Self-pay | Admitting: Family Medicine

## 2022-05-09 LAB — MYOSITIS SPECIFIC II ANTIBODIES PANEL
EJ AB: <11 SI (ref <11)
JO-1 AB: <11 SI (ref <11)
MDA-5 AB: <11 SI (ref <11)
MI-2 ALPHA AB: <11 SI (ref <11)
MI-2 BETA AB: <11 SI (ref <11)
NXP-2 AB: <11 SI (ref <11)
OJ AB: <11 SI (ref <11)
PL-12 AB: <11 SI (ref <11)
PL-7 AB: <11 SI (ref <11)
SRP-AB: <11 SI (ref <11)
TIF-1y AB: <11 SI (ref <11)

## 2022-05-09 LAB — SJOGRENS SYNDROME-B EXTRACTABLE NUCLEAR ANTIBODY: SSB (La) (ENA) Antibody, IgG: <1 AI

## 2022-05-09 LAB — ANTI-DNA ANTIBODY, DOUBLE-STRANDED: ds DNA Ab: <1 IU/mL

## 2022-05-09 LAB — SEDIMENTATION RATE: Sed Rate: 9 mm/h (ref 0–20)

## 2022-05-09 LAB — ANTI-SCLERODERMA ANTIBODY: Scleroderma (Scl-70) (ENA) Antibody, IgG: <1 AI

## 2022-05-09 LAB — CK: Total CK: 50 U/L (ref 44–196)

## 2022-05-09 LAB — ANA: Anti Nuclear Antibody (ANA): NEGATIVE

## 2022-05-09 LAB — ANTI-SMITH ANTIBODY: ENA SM Ab Ser-aCnc: <1 AI

## 2022-05-09 LAB — RHEUMATOID FACTOR: Rheumatoid fact SerPl-aCnc: <14 IU/mL (ref <14)

## 2022-05-09 LAB — C3 AND C4
C3 Complement: 136 mg/dL (ref 82–185)
C4 Complement: 44 mg/dL (ref 15–53)

## 2022-05-09 LAB — RNP ANTIBODY: Ribonucleic Protein(ENA) Antibody, IgG: <1 AI

## 2022-05-09 LAB — SJOGRENS SYNDROME-A EXTRACTABLE NUCLEAR ANTIBODY: SSA (Ro) (ENA) Antibody, IgG: <1 AI

## 2022-05-09 LAB — CYCLIC CITRUL PEPTIDE ANTIBODY, IGG: Cyclic Citrullin Peptide Ab: <16 UNITS

## 2022-05-09 LAB — HMGCR AB (IGG): HMGCR AB (IGG): <2 CU (ref <20)

## 2022-05-09 NOTE — Progress Notes (Signed)
Sedimentation rate normal, complements normal, double-stranded DNA negative, SSA negative, SSB negative, Smith negative, RNP negative, SCL 70 negative, complements normal, anti-CCP negative, rheumatoid factor negative CK normal, myositis panel negative, HMG CR antibody negative.  Please notify patient that all autoimmune labs are  negative.

## 2022-05-13 ENCOUNTER — Other Ambulatory Visit: Payer: Self-pay

## 2022-05-13 ENCOUNTER — Inpatient Hospital Stay: Payer: Medicare HMO | Attending: Internal Medicine | Admitting: Internal Medicine

## 2022-05-13 ENCOUNTER — Inpatient Hospital Stay: Payer: Medicare HMO

## 2022-05-13 VITALS — BP 115/58 | HR 83 | Temp 98.5°F | Resp 16 | Wt 161.2 lb

## 2022-05-13 DIAGNOSIS — C61 Malignant neoplasm of prostate: Secondary | ICD-10-CM | POA: Insufficient documentation

## 2022-05-13 DIAGNOSIS — D649 Anemia, unspecified: Secondary | ICD-10-CM | POA: Insufficient documentation

## 2022-05-13 DIAGNOSIS — D471 Chronic myeloproliferative disease: Secondary | ICD-10-CM | POA: Diagnosis not present

## 2022-05-13 DIAGNOSIS — R161 Splenomegaly, not elsewhere classified: Secondary | ICD-10-CM | POA: Diagnosis not present

## 2022-05-13 DIAGNOSIS — D72829 Elevated white blood cell count, unspecified: Secondary | ICD-10-CM

## 2022-05-13 DIAGNOSIS — D696 Thrombocytopenia, unspecified: Secondary | ICD-10-CM

## 2022-05-13 DIAGNOSIS — J479 Bronchiectasis, uncomplicated: Secondary | ICD-10-CM

## 2022-05-13 LAB — CBC WITH DIFFERENTIAL (CANCER CENTER ONLY)
Abs Immature Granulocytes: 3.44 10*3/uL — ABNORMAL HIGH (ref 0.00–0.07)
Basophils Absolute: 0.1 10*3/uL (ref 0.0–0.1)
Basophils Relative: 1 %
Eosinophils Absolute: 0.1 10*3/uL (ref 0.0–0.5)
Eosinophils Relative: 0 %
HCT: 37.4 % — ABNORMAL LOW (ref 39.0–52.0)
Hemoglobin: 12.1 g/dL — ABNORMAL LOW (ref 13.0–17.0)
Immature Granulocytes: 17 %
Lymphocytes Relative: 7 %
Lymphs Abs: 1.4 10*3/uL (ref 0.7–4.0)
MCH: 29.3 pg (ref 26.0–34.0)
MCHC: 32.4 g/dL (ref 30.0–36.0)
MCV: 90.6 fL (ref 80.0–100.0)
Monocytes Absolute: 1.5 10*3/uL — ABNORMAL HIGH (ref 0.1–1.0)
Monocytes Relative: 7 %
Neutro Abs: 13.7 10*3/uL — ABNORMAL HIGH (ref 1.7–7.7)
Neutrophils Relative %: 68 %
Platelet Count: 273 10*3/uL (ref 150–400)
RBC: 4.13 MIL/uL — ABNORMAL LOW (ref 4.22–5.81)
RDW: 22 % — ABNORMAL HIGH (ref 11.5–15.5)
WBC Count: 20.2 10*3/uL — ABNORMAL HIGH (ref 4.0–10.5)
nRBC: 0.1 % (ref 0.0–0.2)

## 2022-05-13 NOTE — Progress Notes (Signed)
[place] [phone]   Fax:(336) [phone]  OFFICE PROGRESS NOTE  [name], MD [address] [place] [address]  DIAGNOSIS:  1) Thrombocytopenia, most likely idiopathic thrombocytopenic purpura. 2) prostate adenocarcinoma currently under the care of Dr. [name]  PRIOR THERAPY:None.  CURRENT THERAPY: observation.  INTERVAL HISTORY: [name] 81 y.o. male returns to the clinic today for follow-up visit.  The patient is feeling fine today with no concerning complaints.  He mentions that he has been dealing with fungal infection on the face as well as the groin area for a while and now he has a similar infection in his penis and it is getting more swollen.  He was treated with doxycycline for possible infection after he was found to have leukocytosis 7 days ago.  He denied having any current chest pain, shortness of breath, cough or hemoptysis.  He has no nausea, vomiting, diarrhea or constipation.  He is here today for evaluation and repeat blood work.   MEDICAL HISTORY: Past Medical History:  Diagnosis Date   Abnormal chest x-ray    Allergic rhinitis    Allergy    Ankylosing spondylitis (HCC)    Anxiety    Arthritis    Colonic polyp    Coronary atherosclerosis of native coronary artery    Cough    Dizziness 10/21/2016   Ganglion cyst of wrist    left   Heart attack ([place]) 2008   Hemorrhoids    History of malaria    Hyperlipidemia    with low HDL   Hypertension    Kidney stone    Other disorder of muscle, ligament, and fascia    Rotator cuff tear    Swelling of lower extremity 10/16/2015    ALLERGIES:  is allergic to celebrex [celecoxib] and tramadol.  MEDICATIONS:  Current Outpatient Medications  Medication Sig Dispense Refill   amLODipine (NORVASC) 2.5 MG tablet Take 1 tablet (2.5 mg total) by mouth daily. 90 tablet 1   Apoaequorin (PREVAGEN PO) Take 1 tablet by mouth daily.      aspirin 81 MG EC tablet Take 81 mg  by mouth daily.     budesonide-formoterol (SYMBICORT) 80-4.5 MCG/ACT inhaler Inhale 2 puffs into the lungs 2 (two) times daily. 1 each 3   chlorpheniramine (CHLOR-TRIMETON) 4 MG tablet 1 -2 every 4 hours as needed     doxazosin (CARDURA) 1 MG tablet TAKE 1 TABLET (1 MG TOTAL) BY MOUTH DAILY. 90 tablet 1   doxycycline (VIBRA-TABS) 100 MG tablet Take 1 tablet (100 mg total) by mouth 2 (two) times daily. 14 tablet 0   famotidine (PEPCID) 20 MG tablet Take by mouth.     ketoconazole (NIZORAL) 2 % cream Apply topically 2 (two) times daily.     Multiple Vitamins-Minerals (ICAPS AREDS 2) CAPS Take by mouth daily.     pantoprazole (PROTONIX) 40 MG tablet TAKE 1 TABLET 1 TIME DAILY 30 TO 60 MINUTES BEFORE FIRST MEAL OF THE DAY 90 tablet 3   simvastatin (ZOCOR) 20 MG tablet TAKE 1 TABLET EVERY DAY WITH BREAKFAST (APPOINTMENT IS NEEDED FOR REFILLS) 90 tablet 3   UNABLE TO FIND Med Name: CPAP per Dr Halford Chessman     No current facility-administered medications for this visit.    SURGICAL HISTORY:  Past Surgical History:  Procedure Laterality Date   bicep rupture     CORONARY ANGIOPLASTY WITH STENT PLACEMENT     HEMORRHOID SURGERY  05/2009   in [place]   LEFT HEART CATH AND CORONARY ANGIOGRAPHY N/A 01/14/2017   Procedure: Left Heart Cath and Coronary Angiography;  Surgeon: [name], MD;  Location: [place] [facility];  Service: Cardiovascular;  Laterality: N/A;   LEFT HEART CATHETERIZATION WITH CORONARY ANGIOGRAM N/A 09/03/2012   Procedure: LEFT HEART CATHETERIZATION WITH CORONARY ANGIOGRAM;  Surgeon: [name], MD;  Location: [facility] CATH [facility];  Service: Cardiovascular;  Laterality: N/A;   PROSTATE BIOPSY     ROTATOR CUFF REPAIR     bilat by Dr. French Ana    REVIEW OF SYSTEMS:  A comprehensive review of systems was negative except for: Constitutional: positive for fatigue   PHYSICAL EXAMINATION: General appearance: alert, cooperative, fatigued, and no distress Head: Normocephalic, without obvious  abnormality, atraumatic Neck: no adenopathy Lymph nodes: Cervical, supraclavicular, and axillary nodes normal. Resp: clear to auscultation bilaterally Back: symmetric, no curvature. ROM normal. No CVA tenderness. Cardio: regular rate and rhythm, S1, S2 normal, no murmur, click, rub or gallop GI: soft, non-tender; bowel sounds normal; no masses,  no organomegaly Extremities: extremities normal, atraumatic, no cyanosis or edema  ECOG PERFORMANCE STATUS: 1 - Symptomatic but completely ambulatory  Blood pressure (!) 115/58, pulse 83, temperature 98.5 F (36.9 C), temperature source Oral, resp. rate 16, weight 161 lb 3 oz (73.1 kg), SpO2 94 %.  LABORATORY DATA: Lab Results  Component Value Date   WBC 20.2 (H) 05/13/2022   HGB 12.1 (L) 05/13/2022   HCT 37.4 (L) 05/13/2022   MCV 90.6 05/13/2022   PLT 273 05/13/2022      Chemistry      Component Value Date/Time   NA 140 05/07/2022 1500   NA 141 01/03/2020 0832   NA 141 04/21/2017 0823   K 3.9 05/07/2022 1500   K 4.0 04/21/2017 0823   CL 104 05/07/2022 1500   CL 106 09/30/2012 0949   CO2 29 05/07/2022 1500   CO2 25 04/21/2017 0823   BUN 20 05/07/2022 1500   BUN 20 01/03/2020 0832   BUN 20.2 04/21/2017 0823   CREATININE 0.96 05/07/2022 1500   CREATININE 0.9 04/21/2017 0823      Component Value Date/Time   CALCIUM 9.6 05/07/2022 1500   CALCIUM 9.6 04/21/2017 0823   ALKPHOS 81 05/07/2022 1500   ALKPHOS 61 04/21/2017 0823   AST 24 05/07/2022 1500   AST 22 04/21/2017 0823   ALT 20 05/07/2022 1500   ALT 36 04/21/2017 0823   BILITOT 0.8 05/07/2022 1500   BILITOT 0.99 04/21/2017 0823     BONE MARROW REPORT FINAL DIAGNOSIS RADIOGRAPHIC STUDIES: No results found.  ASSESSMENT AND PLAN:  This is a very pleasant 81  years old white male with idiopathic thrombocytopenic purpura. The patient has been in observation and usually seen on annual basis for his thrombocytopenia and his platelet count has been in the normal range over  the last several years. He had repeat CBC today that interestingly showed significant elevation of his total white blood count to 27.5 with absolute neutrophil count of 18,800 and absolute monocyte count of 2000.  The patient also has anemia with hemoglobin of 12.3 and hematocrit 38.4.  His LDH is significantly elevated at 463. These findings in addition to the splenomegaly seen on the recent scan are concerning for me with the possibility of underlying myeloproliferative disorder especially chronic myeloid leukemia but reactive leukocytosis could not be completely excluded. The patient was treated with a course of doxycycline for 7 days and repeat CBC showed  decrease in the total white blood count down to 20.2.  He continues to have mild anemia with hemoglobin of 12.1 and hematocrit 37.4.  He has normal platelets count. The CBC differential is still pending. I also order [place] study for BCR/ABL as well as JAK2 mutation panel to rule out any underlying myeloproliferative disorder. With recent fungal infection that the patient has, I advised him to reach out to his primary care physician Dr. Carlota Raspberry for further evaluation and management of this condition.  He may need infectious disease evaluation at some point if no improvement on the treatment by Dr. Carlota Raspberry. I will see him back for follow-up visit in 3 months for evaluation with repeat CBC unless the pending molecular studies showed a myeloproliferative abnormality. He was advised to call immediately if he has any other concerning symptoms in the interval.  All questions were answered. The patient knows to call the clinic with any problems, questions or concerns. We can certainly see the patient much sooner if necessary.  Disclaimer: This note was dictated with voice recognition software. Similar sounding words can inadvertently be transcribed and may not be corrected upon review.

## 2022-05-14 ENCOUNTER — Encounter: Payer: Self-pay | Admitting: Cardiovascular Disease

## 2022-05-14 ENCOUNTER — Ambulatory Visit: Payer: Medicare HMO | Attending: Cardiovascular Disease | Admitting: Cardiovascular Disease

## 2022-05-14 VITALS — BP 120/50 | HR 67 | Ht 67.0 in | Wt 160.0 lb

## 2022-05-14 DIAGNOSIS — I2510 Atherosclerotic heart disease of native coronary artery without angina pectoris: Secondary | ICD-10-CM | POA: Diagnosis not present

## 2022-05-14 DIAGNOSIS — I10 Essential (primary) hypertension: Secondary | ICD-10-CM | POA: Diagnosis not present

## 2022-05-14 DIAGNOSIS — E782 Mixed hyperlipidemia: Secondary | ICD-10-CM | POA: Diagnosis not present

## 2022-05-14 NOTE — Patient Instructions (Signed)
Medication Instructions:  Your physician recommends that you continue on your current medications as directed. Please refer to the Current Medication list given to you today.  *If you need a refill on your cardiac medications before your next appointment, please call your pharmacy*   Lab Work: NONE If you have labs (blood work) drawn today and your tests are completely normal, you will receive your results only by: MyChart Message (if you have MyChart) OR A paper copy in the mail If you have any lab test that is abnormal or we need to change your treatment, we will call you to review the results.   Testing/Procedures: NONE   Follow-Up: At [facility], you and your health needs are our priority.  As part of our continuing mission to provide you with exceptional heart care, we have created designated Provider Care Teams.  These Care Teams include your primary Cardiologist (physician) and Advanced Practice Providers (APPs -  Physician Assistants and Nurse Practitioners) who all work together to provide you with the care you need, when you need it.  We recommend signing up for the patient portal called "MyChart".  Sign up information is provided on this After Visit Summary.  MyChart is used to connect with patients for Virtual Visits (Telemedicine).  Patients are able to view lab/test results, encounter notes, upcoming appointments, etc.  Non-urgent messages can be sent to your provider as well.   To learn more about what you can do with MyChart, go to [email or web address].    Your next appointment:   6 month(s)  The format for your next appointment:   In Person  Provider:   Michael Cooper, MD       Important Information About Sugar       

## 2022-05-14 NOTE — Progress Notes (Unsigned)
Cardiology Office Note:    Date:  05/14/2022   ID:  [name], DOB [birth year], MRN [id number]  PCP:  [name], MD   [facility] Providers Cardiologist:  [name], MD     Referring MD: [name], MD   Chief Complaint  Patient presents with   Pre-op Exam    History of Present Illness:    [name] is a 81 y.o. male with a hx of hypertension, mixed hyperlipidemia, and coronary artery disease. The patient presented in 2008 with anterior MI and ventricular fibrillation cardiac arrest treated with stenting of the LAD.  Most recent heart catheterization in 2018 demonstrated continued patency of the LAD stent site with no other obstructive coronary disease.  The patient is here alone today. He has been recently followed by Dr [name] with thrombocytopenia, leukocytosis, and known prostate CA that is being managed conservatively. He's had problems recently with a fungal infection in the groin and genital area with plans to follow with Dr Carlota Raspberry later this week. From a cardiac standpoint, his main complaint is generalized fatigue and exercise intolerance. He complains of DOE with brisk walking and fatigue. His appetite is decreased.  His weight is down 15 to 20 pounds.  He also needs cardiac clearance for an upcoming minor surgery where he has a skin tag on the anus that needs to be removed.  He denies any symptoms of chest pain or pressure.  He does have a cough and this is productive of clear sputum.  He has had no fevers or chills.  He has been found to have pulmonary fibrosis on CT imaging and he is now following with [name].  Past Medical History:  Diagnosis Date   Abnormal chest x-ray    Allergic rhinitis    Allergy    Ankylosing spondylitis (HCC)    Anxiety    Arthritis    Colonic polyp    Coronary atherosclerosis of native coronary artery    Cough    Dizziness 10/21/2016   Ganglion cyst of wrist    left   Heart attack ([place]) 2008    Hemorrhoids    History of malaria    Hyperlipidemia    with low HDL   Hypertension    Kidney stone    Other disorder of muscle, ligament, and fascia    Rotator cuff tear    Swelling of lower extremity 10/16/2015    Past Surgical History:  Procedure Laterality Date   bicep rupture     CORONARY ANGIOPLASTY WITH STENT PLACEMENT     HEMORRHOID SURGERY  05/2009   in [place]   LEFT HEART CATH AND CORONARY ANGIOGRAPHY N/A 01/14/2017   Procedure: Left Heart Cath and Coronary Angiography;  Surgeon: [name], MD;  Location: [facility];  Service: Cardiovascular;  Laterality: N/A;   LEFT HEART CATHETERIZATION WITH CORONARY ANGIOGRAM N/A 09/03/2012   Procedure: LEFT HEART CATHETERIZATION WITH CORONARY ANGIOGRAM;  Surgeon: [name], MD;  [facility];  Service: Cardiovascular;  Laterality: N/A;   PROSTATE BIOPSY     ROTATOR CUFF REPAIR     bilat by Dr. French Ana    Current Medications: Current Meds  Medication Sig   amLODipine (NORVASC) 2.5 MG tablet Take 1 tablet (2.5 mg total) by mouth daily.   Apoaequorin (PREVAGEN PO) Take 1 tablet by mouth daily.    aspirin 81 MG EC tablet Take 81 mg by mouth daily.   budesonide-formoterol (SYMBICORT) 80-4.5 MCG/ACT inhaler  Inhale 2 puffs into the lungs 2 (two) times daily.   chlorpheniramine (CHLOR-TRIMETON) 4 MG tablet 1 -2 every 4 hours as needed   ciclopirox (LOPROX) 0.77 % cream Apply 1 Application topically daily.   doxazosin (CARDURA) 1 MG tablet TAKE 1 TABLET (1 MG TOTAL) BY MOUTH DAILY.   doxycycline (VIBRA-TABS) 100 MG tablet Take 1 tablet (100 mg total) by mouth 2 (two) times daily.   famotidine (PEPCID) 20 MG tablet Take by mouth.   ketoconazole (NIZORAL) 2 % cream Apply topically 2 (two) times daily.   Multiple Vitamins-Minerals (ICAPS AREDS 2) CAPS Take by mouth daily.   pantoprazole (PROTONIX) 40 MG tablet TAKE 1 TABLET 1 TIME DAILY 30 TO 60 MINUTES BEFORE FIRST MEAL OF THE DAY   simvastatin (ZOCOR) 20 MG tablet  TAKE 1 TABLET EVERY DAY WITH BREAKFAST (APPOINTMENT IS NEEDED FOR REFILLS)   UNABLE TO FIND Med Name: CPAP per Dr Halford Chessman     Allergies:   Celebrex [celecoxib] and Tramadol   Social History   Socioeconomic History   Marital status: Married    Spouse name: [name]   Number of children: 2   Years of education: Not on file   Highest education level: Not on file  Occupational History   Not on file  Tobacco Use   Smoking status: Never    Passive exposure: Never   Smokeless tobacco: Never  Vaping Use   Vaping Use: Never used  Substance and Sexual Activity   Alcohol use: Yes    Comment: holiday Rare    Drug use: No   Sexual activity: Not Currently  Other Topics Concern   Not on file  Social History Narrative   Not on file   Social Determinants of Health   Financial Resource Strain: Low Risk  (04/03/2022)   Overall Financial Resource Strain (CARDIA)    Difficulty of Paying Living Expenses: Not hard at all  Food Insecurity: No Food Insecurity (04/03/2022)   Hunger Vital Sign    Worried About Running Out of Food in the Last Year: Never true    Ran Out of Food in the Last Year: Never true  Transportation Needs: No Transportation Needs (04/03/2022)   PRAPARE - Hydrologist (Medical): No    Lack of Transportation (Non-Medical): No  Physical Activity: Inactive (04/03/2022)   Exercise Vital Sign    Days of Exercise per Week: 0 days    Minutes of Exercise per Session: 0 min  Stress: No Stress Concern Present (04/03/2022)   [place]    Feeling of Stress : Not at all  Social Connections: Moderately Integrated (04/03/2022)   Social Connection and Isolation Panel [NHANES]    Frequency of Communication with Friends and Family: Three times a week    Frequency of Social Gatherings with Friends and Family: Three times a week    Attends Religious Services: Never    Active Member of Clubs or  Organizations: Yes    Attends Music therapist: More than 4 times per year    Marital Status: Married     Family History: The patient's family history includes Alcohol abuse in his father; Cancer in his mother; Diabetes in his mother; Emphysema in his mother; Rheum arthritis in his mother.  ROS:   Please see the history of present illness.    All other systems reviewed and are negative.  EKGs/Labs/Other Studies Reviewed:    The following studies were  reviewed today: Echo 05/01/2022:  1. Left ventricular ejection fraction, by estimation, is 60 to 65%. The  left ventricle has normal function. The left ventricle has no regional  wall motion abnormalities. Left ventricular diastolic parameters were  normal.   2. Right ventricular systolic function is normal. The right ventricular  size is normal. There is normal pulmonary artery systolic pressure. The  estimated right ventricular systolic pressure is 75.4 mmHg.   3. The mitral valve is normal in structure. Trivial mitral valve  regurgitation. No evidence of mitral stenosis.   4. The aortic valve is tricuspid. Aortic valve regurgitation is not  visualized. Aortic valve sclerosis is present, with no evidence of aortic  valve stenosis.   5. The inferior vena cava is normal in size with greater than 50%  respiratory variability, suggesting right atrial pressure of 3 mmHg.   EKG:  EKG is ordered today.  The ekg ordered today demonstrates normal sinus rhythm 67 bpm, nonspecific ST abnormality  Recent Labs: 02/07/2022: TSH 3.45 05/07/2022: ALT 20; BUN 20; Creatinine 0.96; Potassium 3.9; Sodium 140 05/13/2022: Hemoglobin 12.1; Platelet Count 273  Recent Lipid Panel    Component Value Date/Time   CHOL 96 02/07/2022 1028   CHOL 108 01/03/2020 0832   TRIG 73.0 02/07/2022 1028   HDL 36.60 (L) 02/07/2022 1028   HDL 40 01/03/2020 0832   CHOLHDL 3 02/07/2022 1028   VLDL 14.6 02/07/2022 1028   LDLCALC 45 02/07/2022 1028   LDLCALC  51 01/03/2020 0832     Risk Assessment/Calculations:                Physical Exam:    VS:  BP (!) 120/50 (BP Location: Left Arm, Patient Position: Sitting, Cuff Size: Normal)   Pulse 67   Ht '5\' 7"'$  (1.702 m)   Wt 160 lb (72.6 kg)   SpO2 93%   BMI 25.06 kg/m     Wt Readings from Last 3 Encounters:  05/14/22 160 lb (72.6 kg)  05/13/22 161 lb 3 oz (73.1 kg)  05/07/22 161 lb 12.8 oz (73.4 kg)     GEN:  Well nourished, well developed in no acute distress HEENT: Normal NECK: No JVD; No carotid bruits LYMPHATICS: No lymphadenopathy CARDIAC: RRR, no murmurs, rubs, gallops RESPIRATORY:  Clear to auscultation without rales, wheezing or rhonchi  ABDOMEN: Soft, non-tender, non-distended MUSCULOSKELETAL:  No edema; No deformity  SKIN: Warm and dry NEUROLOGIC:  Alert and oriented x 3 PSYCHIATRIC:  Normal affect   ASSESSMENT:    1. Coronary artery disease involving native coronary artery of native heart without angina pectoris   2. Mixed hyperlipidemia   3. Essential hypertension    PLAN:    In order of problems listed above:  Patient appears stable with no symptoms of angina.  He remains on aspirin and simvastatin as well as amlodipine at low-dose.  He is at low risk of his upcoming surgery to remove an anal skin tag.  He can proceed without further cardiac testing.  If he needs to interrupt aspirin around the procedure he would be a low risk of doing so. Treated with low-dose simvastatin.  Recent labs reviewed with a cholesterol of 96, HDL 37, LDL 45. Blood pressure well controlled on low-dose amlodipine.  States his blood pressures usually range in the 120s over 60s to 70s.           Medication Adjustments/Labs and Tests Ordered: Current medicines are reviewed at length with the patient today.  Concerns regarding medicines  are outlined above.  Orders Placed This Encounter  Procedures   EKG 12-Lead   No orders of the defined types were placed in this  encounter.   Patient Instructions  Medication Instructions:  Your physician recommends that you continue on your current medications as directed. Please refer to the Current Medication list given to you today.  *If you need a refill on your cardiac medications before your next appointment, please call your pharmacy*   Lab Work: NONE If you have labs (blood work) drawn today and your tests are completely normal, you will receive your results only by: [place] (if you have MyChart) OR A paper copy in the mail If you have any lab test that is abnormal or we need to change your treatment, we will call you to review the results.   Testing/Procedures: NONE   Follow-Up: At [facility], you and your health needs are our priority.  As part of our continuing mission to provide you with exceptional heart care, we have created designated Provider Care Teams.  These Care Teams include your primary Cardiologist (physician) and Advanced Practice Providers (APPs -  Physician Assistants and Nurse Practitioners) who all work together to provide you with the care you need, when you need it.  We recommend signing up for the patient portal called "MyChart".  Sign up information is provided on this After Visit Summary.  MyChart is used to connect with patients for Virtual Visits (Telemedicine).  Patients are able to view lab/test results, encounter notes, upcoming appointments, etc.  Non-urgent messages can be sent to your provider as well.   To learn more about what you can do with MyChart, go to [email or web address].    Your next appointment:   6 month(s)  The format for your next appointment:   In Person  Provider:   [name], MD       Important Information About Sugar         Signed, [name], MD  05/14/2022 4:34 PM    [place]

## 2022-05-16 ENCOUNTER — Telehealth: Payer: Self-pay | Admitting: Family Medicine

## 2022-05-16 ENCOUNTER — Encounter: Payer: Self-pay | Admitting: Family Medicine

## 2022-05-16 ENCOUNTER — Ambulatory Visit (INDEPENDENT_AMBULATORY_CARE_PROVIDER_SITE_OTHER): Payer: Medicare HMO | Admitting: Family Medicine

## 2022-05-16 VITALS — BP 122/58 | HR 61 | Temp 98.0°F | Ht 67.0 in | Wt 161.4 lb

## 2022-05-16 DIAGNOSIS — N485 Ulcer of penis: Secondary | ICD-10-CM | POA: Diagnosis not present

## 2022-05-16 DIAGNOSIS — D72829 Elevated white blood cell count, unspecified: Secondary | ICD-10-CM | POA: Diagnosis not present

## 2022-05-16 DIAGNOSIS — R21 Rash and other nonspecific skin eruption: Secondary | ICD-10-CM | POA: Diagnosis not present

## 2022-05-16 DIAGNOSIS — R634 Abnormal weight loss: Secondary | ICD-10-CM | POA: Diagnosis not present

## 2022-05-16 LAB — CBC
HCT: 39.1 % (ref 39.0–52.0)
Hemoglobin: 12.4 g/dL — ABNORMAL LOW (ref 13.0–17.0)
MCHC: 31.8 g/dL (ref 30.0–36.0)
MCV: 89.8 fl (ref 78.0–100.0)
Platelets: 260 10*3/uL (ref 150.0–400.0)
RBC: 4.35 Mil/uL (ref 4.22–5.81)
RDW: 23.5 % — ABNORMAL HIGH (ref 11.5–15.5)
WBC: 21.1 10*3/uL (ref 4.0–10.5)

## 2022-05-16 MED ORDER — VALACYCLOVIR HCL 1 G PO TABS: 1000.0000 mg | ORAL_TABLET | Freq: Two times a day (BID) | ORAL | 0 refills | Status: DC

## 2022-05-16 NOTE — Addendum Note (Signed)
Addended by: Merri Ray R on: 05/16/2022 10:12 AM   Modules accepted: Orders

## 2022-05-16 NOTE — Progress Notes (Signed)
Informed pt of lab results  

## 2022-05-16 NOTE — Telephone Encounter (Signed)
Lab called with a critical on this patient. White blood cell count is 21.1.

## 2022-05-16 NOTE — Addendum Note (Signed)
Addended by: Trixie Deis on: 05/16/2022 11:50 AM   Modules accepted: Orders

## 2022-05-16 NOTE — Progress Notes (Signed)
Subjective:  Patient ID: [name], male    DOB: 20-Apr-1941  Age: 81 y.o. MRN: [id number]  CC:  Chief Complaint  Patient presents with   Rash    Fungal infection on face and groin    HPI [name] presents for   Rash of groin: Oncology appointment from October 2 noted.  History of idiopathic thrombocytopenic purpura.  Fungal infection of face and groin for a while.  Recent leukocytosis with WBC 27.5, possible underlying myeloproliferative disorder versus reactive leukocytosis.  Previously treated with doxycycline for 7 days with some improvement in WBC to 20.2.  Recommended follow-up with me, and possible infectious disease. Some weight loss with decreased appetite.   Has had dampness in folds of groin for years, has treated with otc medicated powder has been helpful - also uses under arms. Now with new rash, sore on penis - initial itching sensation.  Started about 10 days ago. Initially on side of penis, migrated to top of penis. Possible slight improvement with use of doxycycline, but still has lesion. Last dose yesterday. Some penile swelling, with progressive worsening of swelling and redness.  No new urinary symptoms. Chronic weak stream. Urology Dr. [name]. No retention, no penile d/c or dysuria. Uncircumcised. No difficulty retracting/replacing foreskin.  No fever.  Not sexually active, no new partners.    Lab Results  Component Value Date   WBC 20.2 (H) 05/13/2022   HGB 12.1 (L) 05/13/2022   HCT 37.4 (L) 05/13/2022   MCV 90.6 05/13/2022   PLT 273 05/13/2022   Rash of face: Fungal rash on face to sides of nose. Treated by [name] - dermatology. Treated with nizoral cream BID - clearing up - improving.    History Patient Active Problem List   Diagnosis Date Noted   Hypertrophy of prostate without urinary obstruction and other lower urinary tract symptoms (LUTS) 12/04/2021   Prostate cancer ([place]) 05/29/2021   GERD (gastroesophageal reflux disease) 12/15/2017    Unstable angina ([place]) 12/05/2017   Angina at rest 12/05/2017   Atypical chest pain    Dyslipidemia    COPD not affecting current episode of care    Essential hypertension 05/29/2017   Abnormal nuclear cardiac imaging test 01/14/2017   Bronchiectasis without complication ([place]) 13/03/6577   ILD (interstitial lung disease) ([place]) 11/27/2016   Dizziness 10/21/2016   Cough variant asthma  vs UACS 07/22/2016   Obstructive sleep apnea 12/01/2015   Acute bronchitis 11/30/2015   Swelling of lower extremity 10/16/2015   Benign mole 02/04/2014   Impaired glucose tolerance 10/26/2013   NAFLD (nonalcoholic fatty liver disease) 04/27/2013   Thrombocytopenia ([place]) 10/21/2012   Exertional angina 09/03/2012   MALARIA 12/05/2009   COLONIC POLYPS 12/05/2009   HYPERCHOLESTEROLEMIA 12/05/2009   ANXIETY 12/05/2009   HEMORRHOIDS 12/05/2009   Allergic rhinitis 12/05/2009   GANGLION CYST, WRIST, LEFT 12/05/2009   ROTATOR CUFF TEAR 12/05/2009   OTHER DISORDER OF MUSCLE LIGAMENT AND FASCIA 12/05/2009   ABNORMAL CHEST XRAY 12/05/2009   CAD (coronary artery disease) 04/05/2009   Past Medical History:  Diagnosis Date   Abnormal chest x-ray    Allergic rhinitis    Allergy    Ankylosing spondylitis ([place])    Anxiety    Arthritis    Colonic polyp    Coronary atherosclerosis of native coronary artery    Cough    Dizziness 10/21/2016   Ganglion cyst of wrist    left   Heart attack ([place]) 2008   Hemorrhoids  History of malaria    Hyperlipidemia    with low HDL   Hypertension    Kidney stone    Other disorder of muscle, ligament, and fascia    Rotator cuff tear    Swelling of lower extremity 10/16/2015   Past Surgical History:  Procedure Laterality Date   bicep rupture     CORONARY ANGIOPLASTY WITH STENT PLACEMENT     HEMORRHOID SURGERY  05/2009   in [place]   LEFT HEART CATH AND CORONARY ANGIOGRAPHY N/A 01/14/2017   Procedure: Left Heart Cath and Coronary Angiography;  Surgeon: [name],  MD;  Location: [place] [facility];  Service: Cardiovascular;  Laterality: N/A;   LEFT HEART CATHETERIZATION WITH CORONARY ANGIOGRAM N/A 09/03/2012   Procedure: LEFT HEART CATHETERIZATION WITH CORONARY ANGIOGRAM;  Surgeon: [name], MD;  Location[facility];  Service: Cardiovascular;  Laterality: N/A;   PROSTATE BIOPSY     ROTATOR CUFF REPAIR     bilat by Dr. French Ana   Allergies  Allergen Reactions   Celebrex [Celecoxib]     Pt states precipitated his heart attack.   Tramadol Other (See Comments)    Makes patient jumpy   Prior to Admission medications   Medication Sig Start Date End Date Taking? Authorizing Provider  amLODipine (NORVASC) 2.5 MG tablet Take 1 tablet (2.5 mg total) by mouth daily. 04/22/22  Yes [name], MD  Apoaequorin (PREVAGEN PO) Take 1 tablet by mouth daily.    Yes [provider]  aspirin 81 MG EC tablet Take 81 mg by mouth daily.   Yes [provider]  budesonide-formoterol (SYMBICORT) 80-4.5 MCG/ACT inhaler Inhale 2 puffs into the lungs 2 (two) times daily. 03/11/22  Yes [name], MD  ciclopirox (LOPROX) 0.77 % cream Apply 1 Application topically daily.   Yes [provider]  doxazosin (CARDURA) 1 MG tablet TAKE 1 TABLET (1 MG TOTAL) BY MOUTH DAILY. 04/01/22  Yes [name], MD  famotidine (PEPCID) 20 MG tablet Take by mouth.   Yes [provider]  ketoconazole (NIZORAL) 2 % cream Apply topically 2 (two) times daily. 04/25/22  Yes [provider]  Multiple Vitamins-Minerals (ICAPS AREDS 2) CAPS Take by mouth daily.   Yes [provider]  pantoprazole (PROTONIX) 40 MG tablet TAKE 1 TABLET 1 TIME DAILY 30 TO 60 MINUTES BEFORE FIRST MEAL OF THE DAY 11/23/21  Yes [name], MD  simvastatin (ZOCOR) 20 MG tablet TAKE 1 TABLET EVERY DAY WITH BREAKFAST (APPOINTMENT IS NEEDED FOR REFILLS) 12/19/21  Yes [name], MD  UNABLE TO FIND Med Name: CPAP per Dr Halford Chessman   Yes [provider]  chlorpheniramine (CHLOR-TRIMETON) 4 MG tablet 1 -2 every 4 hours as needed Patient not taking: Reported on 05/16/2022 08/14/20   [name], MD  doxycycline (VIBRA-TABS) 100 MG tablet Take 1 tablet (100 mg total) by mouth 2 (two) times daily. Patient not taking: Reported on 05/16/2022 05/07/22   Curt Bears, MD   Social History   Socioeconomic History   Marital status: Married    Spouse name: [name]   Number of children: 2   Years of education: Not on file   Highest education level: Not on file  Occupational History   Not on file  Tobacco Use   Smoking status: Never    Passive exposure: Never   Smokeless tobacco: Never  Vaping Use   Vaping Use: Never used  Substance and Sexual Activity   Alcohol use:  Yes    Comment: holiday Rare    Drug use: No   Sexual activity: Not Currently  Other Topics Concern   Not on file  Social History Narrative   Not on file   Social Determinants of Health   Financial Resource Strain: Low Risk  (04/03/2022)   Overall Financial Resource Strain (CARDIA)    Difficulty of Paying Living Expenses: Not hard at all  Food Insecurity: No Food Insecurity (04/03/2022)   Hunger Vital Sign    Worried About Running Out of Food in the Last Year: Never true    Ran Out of Food in the Last Year: Never true  Transportation Needs: No Transportation Needs (04/03/2022)   PRAPARE - Hydrologist (Medical): No    Lack of Transportation (Non-Medical): No  Physical Activity: Inactive (04/03/2022)   Exercise Vital Sign    Days of Exercise per Week: 0 days    Minutes of Exercise per Session: 0 min  Stress: No Stress Concern Present (04/03/2022)   [place]    Feeling of Stress : Not at all  Social Connections: Moderately Integrated (04/03/2022)   Social Connection and Isolation Panel [NHANES]    Frequency of Communication with Friends and Family: Three times a week     Frequency of Social Gatherings with Friends and Family: Three times a week    Attends Religious Services: Never    Active Member of Clubs or Organizations: Yes    Attends Archivist Meetings: More than 4 times per year    Marital Status: Married  Human resources officer Violence: Not At Risk (04/03/2022)   Humiliation, Afraid, Rape, and Kick questionnaire    Fear of Current or Ex-Partner: No    Emotionally Abused: No    Physically Abused: No    Sexually Abused: No    Review of Systems  Per HPI.  Objective:   Vitals:   05/16/22 0837  BP: (!) 122/58  Pulse: 61  Temp: 98 F (36.7 C)  SpO2: 96%  Weight: 161 lb 6.4 oz (73.2 kg)  Height: '5\' 7"'$  (1.702 m)   Wt Readings from Last 3 Encounters:  05/16/22 161 lb 6.4 oz (73.2 kg)  05/14/22 160 lb (72.6 kg)  05/13/22 161 lb 3 oz (73.1 kg)  Weight 169 on 5/10 visit.  172 in 8/22.     Physical Exam Constitutional:      General: He is not in acute distress.    Appearance: Normal appearance. He is well-developed.  HENT:     Head: Normocephalic and atraumatic.  Cardiovascular:     Rate and Rhythm: Normal rate.  Pulmonary:     Effort: Pulmonary effort is normal.  Skin:    Comments: Doral penile shaft ulceration, minimal surrounding erythema. See photo. 2 small erythematous papules on base of head of penis on right. Foreskin easily retracted.   Neurological:     Mental Status: He is alert and oriented to person, place, and time.  Psychiatric:        Mood and Affect: Mood normal.        48 minutes spent during visit, including chart review, review of prior labs, discussion as above, consultation with other physician, counseling and assimilation of information, exam, discussion of plan, and chart completion.   Assessment & Plan:  TOWNSEND CUDWORTH is a 81 y.o. male . Ulcer of penis - Plan: CBC, RPR, HSV(herpes simplex vrs) 1+2 ab-IgG, Herpes simplex virus  culture, valACYclovir (VALTREX) 1000 MG tablet  Penile  rash  Leukocytosis, unspecified type - Plan: CBC  Weight loss - Plan: CBC, HIV antibody (with reflex)  Penile rash, ulceration, minimal change with doxycycline.  Possible herpetic rash.  Check HSV culture, titer, RPR, HIV, repeat CBC with prior leukocytosis.  Start Valtrex.  Unfortunately he will be out of town next week, urgent care/ER precautions given if worsening, otherwise recheck as soon as he is back in town on the 16th.  Weight loss as above, followed by hematology with possible follow-up testing/evaluation.  We will recheck CBC and weight check on October 16th.  RTC/ER precautions given in the interim.  Meds ordered this encounter  Medications   valACYclovir (VALTREX) 1000 MG tablet    Sig: Take 1 tablet (1,000 mg total) by mouth 2 (two) times daily.    Dispense:  14 tablet    Refill:  0   Patient Instructions  Keep follow up with hematologist about the blood counts and weight changes. I will check your blood count and some other virus test today, but rash on penis could be from HSV or herpes virus.  Sometimes can be seen if your immune system changes, or in some patients can have that rarely.  Previous antibiotic would not help that condition.  Try starting Valtrex antiviral twice per day for the next 1 week.  I will check some other labs.  Follow-up with me on Monday, October 16, but if any worsening or new symptoms prior to that time, be seen by medical provider when you are traveling.  Urgent care or ER if needed.  Take care.     Signed,   Merri Ray, MD [place] 05/16/22 10:05 AM

## 2022-05-16 NOTE — Patient Instructions (Addendum)
Keep follow up with hematologist about the blood counts and weight changes. I will check your blood count and some other virus test today, but rash on penis could be from HSV or herpes virus.  Sometimes can be seen if your immune system changes, or in some patients can have that rarely.  Previous antibiotic would not help that condition.  Try starting Valtrex antiviral twice per day for the next 1 week.  I will check some other labs.  Follow-up with me on Monday, October 16, but if any worsening or new symptoms prior to that time, be seen by medical provider when you are traveling.  Urgent care or ER if needed.  Take care.

## 2022-05-17 ENCOUNTER — Telehealth: Payer: Self-pay | Admitting: Medical Oncology

## 2022-05-17 NOTE — Telephone Encounter (Signed)
Tried to fax records -fax number given is not accurate .

## 2022-05-20 ENCOUNTER — Other Ambulatory Visit: Payer: Self-pay | Admitting: Internal Medicine

## 2022-05-20 DIAGNOSIS — D696 Thrombocytopenia, unspecified: Secondary | ICD-10-CM

## 2022-05-20 LAB — JAK2 (INCLUDING V617F AND EXON 12), MPL,& CALR-NEXT GEN SEQ

## 2022-05-20 LAB — BCR ABL1 FISH (GENPATH)

## 2022-05-22 LAB — TIQ-NTM

## 2022-05-22 LAB — HERPES SIMPLEX VIRUS CULTURE

## 2022-05-22 LAB — HIV ANTIBODY (ROUTINE TESTING W REFLEX): HIV 1&2 Ab, 4th Generation: NONREACTIVE

## 2022-05-22 LAB — HSV(HERPES SIMPLEX VRS) I + II AB-IGG
HAV 1 IGG,TYPE SPECIFIC AB: 1.32 index — ABNORMAL HIGH
HSV 2 IGG,TYPE SPECIFIC AB: <0.9 index

## 2022-05-22 LAB — RPR: RPR Ser Ql: NONREACTIVE

## 2022-05-27 ENCOUNTER — Ambulatory Visit (INDEPENDENT_AMBULATORY_CARE_PROVIDER_SITE_OTHER): Payer: Medicare HMO | Admitting: Family Medicine

## 2022-05-27 ENCOUNTER — Ambulatory Visit: Payer: Medicare HMO | Admitting: Cardiovascular Disease

## 2022-05-27 ENCOUNTER — Encounter: Payer: Self-pay | Admitting: Family Medicine

## 2022-05-27 VITALS — BP 118/50 | HR 66 | Temp 98.6°F | Ht 67.0 in | Wt 162.8 lb

## 2022-05-27 DIAGNOSIS — R21 Rash and other nonspecific skin eruption: Secondary | ICD-10-CM

## 2022-05-27 DIAGNOSIS — D72829 Elevated white blood cell count, unspecified: Secondary | ICD-10-CM

## 2022-05-27 DIAGNOSIS — N485 Ulcer of penis: Secondary | ICD-10-CM

## 2022-05-27 MED ORDER — VALACYCLOVIR HCL 500 MG PO TABS: 500.0000 mg | ORAL_TABLET | Freq: Two times a day (BID) | ORAL | 1 refills | Status: DC

## 2022-05-27 NOTE — Progress Notes (Signed)
Subjective:  Patient ID: [name], male    DOB: April 01, 1941  Age: 81 y.o. MRN: [id number]  CC:  Chief Complaint  Patient presents with   Rash    Recheck penile rash     HPI [name] presents for   Penile rash/ulceration: Follow-up from October 5 visit.  10 days a rash at that time, initially on the side of the penis then migrated to the top.  Slight improvement with use of doxycycline from hematology but still persistent lesion.  2 small erythematous papules on the base of the glans on the right with dorsal penile shaft ulceration with minimal surrounding erythema at last visit.  Started on Valtrex for possible herpetic lesion. HSV titer positive for type I.  HSV culture ordered but does not appear that was able to be performed.  RPR, HIV nonreactive.  History of leukocytosis monitored by hematology, elevated again last visit.  Had been experiencing weight loss along with leukocytosis.  Plan for repeat today.  Ulcer has improved - cleared up. Finished antiviral. Slight HA, nausea, chills 1st day of valtrex. No recurrence.  No new rash.  No fever Wife has cold sores, but no oral sex.   WBC 27.5 in May 08, 2019 0.2 on May 14, 2019 1.1 on October 5. Weight improved today. No home readings. Vacation last week. Has orders pended form hematology - appt planned in 1 week.    Wt Readings from Last 3 Encounters:  05/27/22 162 lb 12.8 oz (73.8 kg)  05/16/22 161 lb 6.4 oz (73.2 kg)  05/14/22 160 lb (72.6 kg)    He is taking doxazosin.  He has noticed his diastolic blood pressure little bit on the low side in the 50s.  Not having symptoms level.  Still taking amlodipine.  History Patient Active Problem List   Diagnosis Date Noted   Hypertrophy of prostate without urinary obstruction and other lower urinary tract symptoms (LUTS) 12/04/2021   Prostate cancer ([place]) 05/29/2021   GERD (gastroesophageal reflux disease) 12/15/2017   Unstable angina ([place]) 12/05/2017    Angina at rest 12/05/2017   Atypical chest pain    Dyslipidemia    COPD not affecting current episode of care    Essential hypertension 05/29/2017   Abnormal nuclear cardiac imaging test 01/14/2017   Bronchiectasis without complication ([place]) 53/97/6734   ILD (interstitial lung disease) ([place]) 11/27/2016   Dizziness 10/21/2016   Cough variant asthma  vs UACS 07/22/2016   Obstructive sleep apnea 12/01/2015   Acute bronchitis 11/30/2015   Swelling of lower extremity 10/16/2015   Benign mole 02/04/2014   Impaired glucose tolerance 10/26/2013   NAFLD (nonalcoholic fatty liver disease) 04/27/2013   Thrombocytopenia ([place]) 10/21/2012   Exertional angina 09/03/2012   MALARIA 12/05/2009   COLONIC POLYPS 12/05/2009   HYPERCHOLESTEROLEMIA 12/05/2009   ANXIETY 12/05/2009   HEMORRHOIDS 12/05/2009   Allergic rhinitis 12/05/2009   GANGLION CYST, WRIST, LEFT 12/05/2009   ROTATOR CUFF TEAR 12/05/2009   OTHER DISORDER OF MUSCLE LIGAMENT AND FASCIA 12/05/2009   ABNORMAL CHEST XRAY 12/05/2009   CAD (coronary artery disease) 04/05/2009   Past Medical History:  Diagnosis Date   Abnormal chest x-ray    Allergic rhinitis    Allergy    Ankylosing spondylitis (HCC)    Anxiety    Arthritis    Colonic polyp    Coronary atherosclerosis of native coronary artery    Cough    Dizziness 10/21/2016   Ganglion cyst of wrist    left  Heart attack ([place]) 2008   Hemorrhoids    History of malaria    Hyperlipidemia    with low HDL   Hypertension    Kidney stone    Other disorder of muscle, ligament, and fascia    Rotator cuff tear    Swelling of lower extremity 10/16/2015   Past Surgical History:  Procedure Laterality Date   bicep rupture     CORONARY ANGIOPLASTY WITH STENT PLACEMENT     HEMORRHOID SURGERY  05/2009   in [facility]   LEFT HEART CATH AND CORONARY ANGIOGRAPHY N/A 01/14/2017   Procedure: Left Heart Cath and Coronary Angiography;  Surgeon: [name], MD;  Location: [place] [facility];   Service: Cardiovascular;  Laterality: N/A;   LEFT HEART CATHETERIZATION WITH CORONARY ANGIOGRAM N/A 09/03/2012   Procedure: LEFT HEART CATHETERIZATION WITH CORONARY ANGIOGRAM;  Surgeon: [name], MD;  Location: [facility] CATH LAB;  Service: Cardiovascular;  Laterality: N/A;   PROSTATE BIOPSY     ROTATOR CUFF REPAIR     bilat by Dr. French Ana   Allergies  Allergen Reactions   Celebrex [Celecoxib]     Pt states precipitated his heart attack.   Tramadol Other (See Comments)    Makes patient jumpy   Prior to Admission medications   Medication Sig Start Date End Date Taking? Authorizing Provider  amLODipine (NORVASC) 2.5 MG tablet Take 1 tablet (2.5 mg total) by mouth daily. 04/22/22  Yes [name], MD  Apoaequorin (PREVAGEN PO) Take 1 tablet by mouth daily.    Yes [provider]  aspirin 81 MG EC tablet Take 81 mg by mouth daily.   Yes [provider]  budesonide-formoterol (SYMBICORT) 80-4.5 MCG/ACT inhaler Inhale 2 puffs into the lungs 2 (two) times daily. 03/11/22  Yes [name], MD  ciclopirox (LOPROX) 0.77 % cream Apply 1 Application topically daily.   Yes [provider]  famotidine (PEPCID) 20 MG tablet Take by mouth.   Yes [provider]  ketoconazole (NIZORAL) 2 % cream Apply topically 2 (two) times daily. 04/25/22  Yes [provider]  Multiple Vitamins-Minerals (ICAPS AREDS 2) CAPS Take by mouth daily.   Yes [provider]  pantoprazole (PROTONIX) 40 MG tablet TAKE 1 TABLET 1 TIME DAILY 30 TO 60 MINUTES BEFORE FIRST MEAL OF THE DAY 11/23/21  Yes [name], MD  simvastatin (ZOCOR) 20 MG tablet TAKE 1 TABLET EVERY DAY WITH BREAKFAST (APPOINTMENT IS NEEDED FOR REFILLS) 12/19/21  Yes [name], MD  UNABLE TO FIND Med Name: CPAP per Dr Halford Chessman   Yes [provider]  chlorpheniramine (CHLOR-TRIMETON) 4 MG tablet 1 -2 every 4 hours as needed Patient not taking: Reported on 05/16/2022 08/14/20   [name], MD  doxazosin (CARDURA) 1 MG tablet TAKE 1 TABLET (1 MG TOTAL) BY MOUTH DAILY. Patient not taking: Reported on 05/27/2022 04/01/22   [name], MD  doxycycline (VIBRA-TABS) 100 MG tablet Take 1 tablet (100 mg total) by mouth 2 (two) times daily. Patient not taking: Reported on 05/16/2022 05/07/22   Curt Bears, MD  valACYclovir (VALTREX) 1000 MG tablet Take 1 tablet (1,000 mg total) by mouth 2 (two) times daily. Patient not taking: Reported on 05/27/2022 05/16/22   [name], MD   Social History   Socioeconomic History   Marital status: Married    Spouse name: [name]   Number of children: 2   Years of education: Not on file   Highest education level: Not  on file  Occupational History   Not on file  Tobacco Use   Smoking status: Never    Passive exposure: Never   Smokeless tobacco: Never  Vaping Use   Vaping Use: Never used  Substance and Sexual Activity   Alcohol use: Yes    Comment: holiday Rare    Drug use: No   Sexual activity: Not Currently  Other Topics Concern   Not on file  Social History Narrative   Not on file   Social Determinants of Health   Financial Resource Strain: Low Risk  (04/03/2022)   Overall Financial Resource Strain (CARDIA)    Difficulty of Paying Living Expenses: Not hard at all  Food Insecurity: No Food Insecurity (04/03/2022)   Hunger Vital Sign    Worried About Running Out of Food in the Last Year: Never true    [place] in the Last Year: Never true  Transportation Needs: No Transportation Needs (04/03/2022)   PRAPARE - Hydrologist (Medical): No    Lack of Transportation (Non-Medical): No  Physical Activity: Inactive (04/03/2022)   Exercise Vital Sign    Days of Exercise per Week: 0 days    Minutes of Exercise per Session: 0 min  Stress: No Stress Concern Present (04/03/2022)   [place]    Feeling of Stress : Not at all   Social Connections: Moderately Integrated (04/03/2022)   Social Connection and Isolation Panel [NHANES]    Frequency of Communication with Friends and Family: Three times a week    Frequency of Social Gatherings with Friends and Family: Three times a week    Attends Religious Services: Never    Active Member of Clubs or Organizations: Yes    Attends Archivist Meetings: More than 4 times per year    Marital Status: Married  Human resources officer Violence: Not At Risk (04/03/2022)   Humiliation, Afraid, Rape, and Kick questionnaire    Fear of Current or Ex-Partner: No    Emotionally Abused: No    Physically Abused: No    Sexually Abused: No    Review of Systems   Objective:   Vitals:   05/27/22 0926  BP: (!) 118/50  Pulse: 66  Temp: 98.6 F (37 C)  SpO2: 94%  Weight: 162 lb 12.8 oz (73.8 kg)  Height: '5\' 7"'$  (1.702 m)   BP Readings from Last 3 Encounters:  05/27/22 (!) 118/50  05/16/22 (!) 122/58  05/14/22 (!) 120/50      Physical Exam Vitals reviewed.  Constitutional:      Appearance: He is well-developed.  HENT:     Head: Normocephalic and atraumatic.  Neck:     Vascular: No carotid bruit or JVD.  Cardiovascular:     Rate and Rhythm: Normal rate and regular rhythm.     Heart sounds: Normal heart sounds. No murmur heard. Pulmonary:     Effort: Pulmonary effort is normal.     Breath sounds: Normal breath sounds. No rales.  Genitourinary:    Penis: Normal.      Comments: No penile rash, ulceration or erythema. Musculoskeletal:     Right lower leg: No edema.     Left lower leg: No edema.  Skin:    General: Skin is warm and dry.  Neurological:     Mental Status: He is alert and oriented to person, place, and time.  Psychiatric:        Mood  and Affect: Mood normal.      Assessment & Plan:  [name] is a 81 y.o. male . Leukocytosis, unspecified type  Penile rash - Plan: valACYclovir (VALTREX) 500 MG tablet  Ulcer of penis - Plan:  valACYclovir (VALTREX) 500 MG tablet  Ulceration/rash has improved/resolved.  Spouse with cold sores, positive HSV-1 on titer.  Possible herpetic with improvement on Valtrex.  Repeat Valtrex prescription given if recurrence of symptoms.  Continue follow-up with hematology as planned in 1 week for repeat labs, evaluation of leukocytosis.  RTC/ER precautions given if return of fever, chills, nausea or other new symptoms.  Diastolic blood pressure on the lower side, option to hold amlodipine if persistent lower readings or symptomatic.  Meds ordered this encounter  Medications   valACYclovir (VALTREX) 500 MG tablet    Sig: Take 1 tablet (500 mg total) by mouth 2 (two) times daily. Take at 1st sign of penile rash.    Dispense:  6 tablet    Refill:  1   Patient Instructions  Glad to hear that the penile rash has resolved. If it returns, start Valtrex 1 pill twice per day for 3 days. Keep follow-up with hematology regarding elevated white blood cell count.  If any new symptoms including any return of fever, chills, nausea, or other new symptoms be seen right away. Diastolic blood pressure is on the lower side.  If you notice continued lower readings at home including the upper reading running lower than usual, stop the amlodipine.  Make sure you are drinking plenty of fluids in the meantime.  Return to the clinic or go to the nearest emergency room if any of your symptoms worsen or new symptoms occur.     Signed,   Merri Ray, MD [place], [facility] 05/27/22 10:34 AM

## 2022-05-27 NOTE — Patient Instructions (Addendum)
Glad to hear that the penile rash has resolved. If it returns, start Valtrex 1 pill twice per day for 3 days. Keep follow-up with hematology regarding elevated white blood cell count.  If any new symptoms including any return of fever, chills, nausea, or other new symptoms be seen right away. Diastolic blood pressure is on the lower side.  If you notice continued lower readings at home including the upper reading running lower than usual, stop the amlodipine.  Make sure you are drinking plenty of fluids in the meantime.  Return to the clinic or go to the nearest emergency room if any of your symptoms worsen or new symptoms occur.

## 2022-06-03 ENCOUNTER — Inpatient Hospital Stay: Payer: Medicare HMO

## 2022-06-03 ENCOUNTER — Inpatient Hospital Stay: Payer: Medicare HMO | Admitting: Internal Medicine

## 2022-06-03 ENCOUNTER — Other Ambulatory Visit: Payer: Self-pay

## 2022-06-03 ENCOUNTER — Encounter: Payer: Self-pay | Admitting: Internal Medicine

## 2022-06-03 VITALS — BP 138/52 | HR 70 | Temp 98.3°F | Resp 16 | Ht 67.0 in | Wt 162.3 lb

## 2022-06-03 DIAGNOSIS — D72829 Elevated white blood cell count, unspecified: Secondary | ICD-10-CM

## 2022-06-03 DIAGNOSIS — C61 Malignant neoplasm of prostate: Secondary | ICD-10-CM | POA: Diagnosis not present

## 2022-06-03 DIAGNOSIS — D696 Thrombocytopenia, unspecified: Secondary | ICD-10-CM

## 2022-06-03 DIAGNOSIS — D471 Chronic myeloproliferative disease: Secondary | ICD-10-CM | POA: Diagnosis not present

## 2022-06-03 DIAGNOSIS — D649 Anemia, unspecified: Secondary | ICD-10-CM | POA: Diagnosis not present

## 2022-06-03 DIAGNOSIS — R161 Splenomegaly, not elsewhere classified: Secondary | ICD-10-CM | POA: Diagnosis not present

## 2022-06-03 LAB — CBC WITH DIFFERENTIAL (CANCER CENTER ONLY)
Abs Immature Granulocytes: 6.14 10*3/uL — ABNORMAL HIGH (ref 0.00–0.07)
Basophils Absolute: 0.2 10*3/uL — ABNORMAL HIGH (ref 0.0–0.1)
Basophils Relative: 1 %
Eosinophils Absolute: 0.1 10*3/uL (ref 0.0–0.5)
Eosinophils Relative: 0 %
HCT: 36.8 % — ABNORMAL LOW (ref 39.0–52.0)
Hemoglobin: 11.5 g/dL — ABNORMAL LOW (ref 13.0–17.0)
Immature Granulocytes: 21 %
Lymphocytes Relative: 5 %
Lymphs Abs: 1.4 10*3/uL (ref 0.7–4.0)
MCH: 28.5 pg (ref 26.0–34.0)
MCHC: 31.3 g/dL (ref 30.0–36.0)
MCV: 91.3 fL (ref 80.0–100.0)
Monocytes Absolute: 1.7 10*3/uL — ABNORMAL HIGH (ref 0.1–1.0)
Monocytes Relative: 6 %
Neutro Abs: 19.3 10*3/uL — ABNORMAL HIGH (ref 1.7–7.7)
Neutrophils Relative %: 67 %
Platelet Count: 269 10*3/uL (ref 150–400)
RBC: 4.03 MIL/uL — ABNORMAL LOW (ref 4.22–5.81)
RDW: 23.9 % — ABNORMAL HIGH (ref 11.5–15.5)
WBC Count: 28.8 10*3/uL — ABNORMAL HIGH (ref 4.0–10.5)
nRBC: 0.3 % — ABNORMAL HIGH (ref 0.0–0.2)

## 2022-06-03 LAB — LACTATE DEHYDROGENASE: LDH: 500 U/L — ABNORMAL HIGH (ref 98–192)

## 2022-06-03 LAB — CMP (CANCER CENTER ONLY)
ALT: 26 U/L (ref 0–44)
AST: 24 U/L (ref 15–41)
Albumin: 4.4 g/dL (ref 3.5–5.0)
Alkaline Phosphatase: 89 U/L (ref 38–126)
Anion gap: 6 (ref 5–15)
BUN: 16 mg/dL (ref 8–23)
CO2: 29 mmol/L (ref 22–32)
Calcium: 9.4 mg/dL (ref 8.9–10.3)
Chloride: 106 mmol/L (ref 98–111)
Creatinine: 0.89 mg/dL (ref 0.61–1.24)
GFR, Estimated: >60 mL/min (ref >60)
Glucose, Bld: 105 mg/dL — ABNORMAL HIGH (ref 70–99)
Potassium: 3.9 mmol/L (ref 3.5–5.1)
Sodium: 141 mmol/L (ref 135–145)
Total Bilirubin: 0.8 mg/dL (ref 0.3–1.2)
Total Protein: 7.2 g/dL (ref 6.5–8.1)

## 2022-06-03 MED ORDER — HYDROXYUREA 500 MG PO CAPS: 500.0000 mg | ORAL_CAPSULE | Freq: Every day | ORAL | 3 refills | Status: DC

## 2022-06-03 MED ORDER — PROCHLORPERAZINE MALEATE 10 MG PO TABS: 10.0000 mg | ORAL_TABLET | Freq: Four times a day (QID) | ORAL | 0 refills | Status: DC | PRN

## 2022-06-03 NOTE — Progress Notes (Signed)
[place] [phone]   [phone]  OFFICE PROGRESS NOTE  [name], MD [address] [place] [address]  DIAGNOSIS:  1) myeloproliferative disorder presented [name] leukocytosis with positive JAK2 mutation  2) prostate adenocarcinoma currently under the care of Dr. [name]  PRIOR THERAPY:None.  CURRENT THERAPY: Hydroxyurea 500 mg p.o. daily.  Expected to start in the next few days.  INTERVAL HISTORY: [name] 81 y.o. male returns to the clinic today for follow-up visit.  The patient is feeling fine today with no concerning complaints.  The patient denied having any chest pain, shortness of breath but has mild cough productive of whitish sputum with no hemoptysis.  He has no nausea, vomiting, diarrhea or constipation.  He has no headache or visual changes.  He has no recent weight loss or night sweats.  He has molecular studies performed recently including [place] study for BCR/ABL as well as JAK2 mutation.  The JAK2 mutation was reported to be abnormal consistent with myeloproliferative disorder.  The patient is here today for evaluation and recommendation regarding treatment of his condition.  MEDICAL HISTORY: Past Medical History:  Diagnosis Date   Abnormal chest x-ray    Allergic rhinitis    Allergy    Ankylosing spondylitis (HCC)    Anxiety    Arthritis    Colonic polyp    Coronary atherosclerosis of native coronary artery    Cough    Dizziness 10/21/2016   Ganglion cyst of wrist    left   Heart attack ([place]) 2008   Hemorrhoids    History of malaria    Hyperlipidemia    with low HDL   Hypertension    Kidney stone    Other disorder of muscle, ligament, and fascia    Rotator cuff tear    Swelling of lower extremity 10/16/2015    ALLERGIES:  is allergic to celebrex [celecoxib] and tramadol.  MEDICATIONS:  Current Outpatient Medications  Medication Sig Dispense Refill   amLODipine (NORVASC) 2.5 MG tablet Take  1 tablet (2.5 mg total) by mouth daily. 90 tablet 1   Apoaequorin (PREVAGEN PO) Take 1 tablet by mouth daily.      aspirin 81 MG EC tablet Take 81 mg by mouth daily.     budesonide-formoterol (SYMBICORT) 80-4.5 MCG/ACT inhaler Inhale 2 puffs into the lungs 2 (two) times daily. 1 each 3   chlorpheniramine (CHLOR-TRIMETON) 4 MG tablet 1 -2 every 4 hours as needed (Patient not taking: Reported on 05/16/2022)     ciclopirox (LOPROX) 0.77 % cream Apply 1 Application topically daily.     famotidine (PEPCID) 20 MG tablet Take by mouth.     ketoconazole (NIZORAL) 2 % cream Apply topically 2 (two) times daily.     Multiple Vitamins-Minerals (ICAPS AREDS 2) CAPS Take by mouth daily.     pantoprazole (PROTONIX) 40 MG tablet TAKE 1 TABLET 1 TIME DAILY 30 TO 60 MINUTES BEFORE FIRST MEAL OF THE DAY 90 tablet 3   simvastatin (ZOCOR) 20 MG tablet TAKE 1 TABLET EVERY DAY WITH BREAKFAST (APPOINTMENT IS NEEDED FOR REFILLS) 90 tablet 3   UNABLE TO FIND Med Name: CPAP per Dr Halford Chessman     valACYclovir (VALTREX) 500 MG tablet Take 1 tablet (500 mg total) by mouth 2 (two) times daily. Take at 1st sign of penile rash. 6 tablet 1   No current facility-administered medications for this visit.    SURGICAL HISTORY:  Past  Surgical History:  Procedure Laterality Date   bicep rupture     CORONARY ANGIOPLASTY WITH STENT PLACEMENT     HEMORRHOID SURGERY  05/2009   in [facility]   LEFT HEART CATH AND CORONARY ANGIOGRAPHY N/A 01/14/2017   Procedure: Left Heart Cath and Coronary Angiography;  Surgeon: [name], MD;  Location: [place] CV LAB;  Service: Cardiovascular;  Laterality: N/A;   LEFT HEART CATHETERIZATION WITH CORONARY ANGIOGRAM N/A 09/03/2012   Procedure: LEFT HEART CATHETERIZATION WITH CORONARY ANGIOGRAM;  Surgeon: [name], MD;  Location: [facility] CATH LAB;  Service: Cardiovascular;  Laterality: N/A;   PROSTATE BIOPSY     ROTATOR CUFF REPAIR     bilat by Dr. French Ana    REVIEW OF SYSTEMS:  Constitutional:  positive for fatigue Eyes: negative Ears, nose, mouth, throat, and face: negative Respiratory: positive for cough Cardiovascular: negative Gastrointestinal: negative Genitourinary:negative Integument/breast: negative Hematologic/lymphatic: negative Musculoskeletal:negative Neurological: negative Behavioral/Psych: negative Endocrine: negative Allergic/Immunologic: negative   PHYSICAL EXAMINATION: General appearance: alert, cooperative, fatigued, and no distress Head: Normocephalic, without obvious abnormality, atraumatic Neck: no adenopathy Lymph nodes: Cervical, supraclavicular, and axillary nodes normal. Resp: clear to auscultation bilaterally Back: symmetric, no curvature. ROM normal. No CVA tenderness. Cardio: regular rate and rhythm, S1, S2 normal, no murmur, click, rub or gallop GI: soft, non-tender; bowel sounds normal; no masses,  no organomegaly Extremities: extremities normal, atraumatic, no cyanosis or edema Neurologic: Alert and oriented X 3, normal strength and tone. Normal symmetric reflexes. Normal coordination and gait  ECOG PERFORMANCE STATUS: 1 - Symptomatic but completely ambulatory  Blood pressure (!) 138/52, pulse 70, temperature 98.3 F (36.8 C), resp. rate 16, height '5\' 7"'$  (1.702 m), weight 162 lb 5 oz (73.6 kg), SpO2 93 %.  LABORATORY DATA: Lab Results  Component Value Date   WBC 21.1 Repeated and verified X2. (HH) 05/16/2022   HGB 12.4 (L) 05/16/2022   HCT 39.1 05/16/2022   MCV 89.8 05/16/2022   PLT 260.0 05/16/2022      Chemistry      Component Value Date/Time   NA 140 05/07/2022 1500   NA 141 01/03/2020 0832   NA 141 04/21/2017 0823   K 3.9 05/07/2022 1500   K 4.0 04/21/2017 0823   CL 104 05/07/2022 1500   CL 106 09/30/2012 0949   CO2 29 05/07/2022 1500   CO2 25 04/21/2017 0823   BUN 20 05/07/2022 1500   BUN 20 01/03/2020 0832   BUN 20.2 04/21/2017 0823   CREATININE 0.96 05/07/2022 1500   CREATININE 0.9 04/21/2017 0823       Component Value Date/Time   CALCIUM 9.6 05/07/2022 1500   CALCIUM 9.6 04/21/2017 0823   ALKPHOS 81 05/07/2022 1500   ALKPHOS 61 04/21/2017 0823   AST 24 05/07/2022 1500   AST 22 04/21/2017 0823   ALT 20 05/07/2022 1500   ALT 36 04/21/2017 0823   BILITOT 0.8 05/07/2022 1500   BILITOT 0.99 04/21/2017 0823     BONE MARROW REPORT FINAL DIAGNOSIS RADIOGRAPHIC STUDIES: No results found.  ASSESSMENT AND PLAN:  This is a very pleasant 81  years old white male with history of ITP and now presented with JAK2 positive myeloproliferative disorder.[name] He presented recently with leukocytosis and splenomegaly. I had a lengthy discussion with the patient today about his current condition and treatment options. I explained to the patient that she has evidence for myeloproliferative disorder based on the JAK2 mutation and the presentation with leukocytosis and splenomegaly.  [place] study for BCR/ABL was negative.  I discussed with the patient his treatment option including treatment with hydroxyurea starting at a low dose of 500 mg p.o. daily.  I discussed with the patient the adverse effect of this treatment and provided him with handout about hydroxyurea. He is expected to start this treatment in few days. For the nausea, I will give him prescription for Compazine 10 mg p.o. every 6 hours as needed. The patient will come back for follow-up visit in 1 months for evaluation and repeat blood work. He was advised to call immediately if he has any other concerning symptoms in the interval.  All questions were answered. The patient knows to call the clinic with any problems, questions or concerns. We can certainly see the patient much sooner if necessary.  Disclaimer: This note was dictated with voice recognition software. Similar sounding words can inadvertently be transcribed and may not be corrected upon review.

## 2022-06-04 ENCOUNTER — Other Ambulatory Visit: Payer: Self-pay | Admitting: Internal Medicine

## 2022-06-04 ENCOUNTER — Other Ambulatory Visit: Payer: Self-pay | Admitting: *Deleted

## 2022-06-04 NOTE — Progress Notes (Signed)
Erroneous encounter

## 2022-06-05 ENCOUNTER — Ambulatory Visit (INDEPENDENT_AMBULATORY_CARE_PROVIDER_SITE_OTHER): Payer: Medicare HMO | Admitting: Internal Medicine

## 2022-06-05 DIAGNOSIS — J849 Interstitial pulmonary disease, unspecified: Secondary | ICD-10-CM | POA: Diagnosis not present

## 2022-06-05 DIAGNOSIS — H524 Presbyopia: Secondary | ICD-10-CM | POA: Diagnosis not present

## 2022-06-05 LAB — PULMONARY FUNCTION TEST
DL/VA % pred: 91 %
DL/VA: 3.65 ml/min/mmHg/L
DLCO cor % pred: 57 %
DLCO cor: 12.63 ml/min/mmHg
DLCO unc % pred: 51 %
DLCO unc: 11.37 ml/min/mmHg
FEF 25-75 Post: 2.77 L/sec
FEF 25-75 Pre: 2.33 L/sec
FEF2575-%Change-Post: 18 %
FEF2575-%Pred-Post: 157 %
FEF2575-%Pred-Pre: 132 %
FEV1-%Change-Post: 6 %
FEV1-%Pred-Post: 73 %
FEV1-%Pred-Pre: 69 %
FEV1-Post: 1.85 L
FEV1-Pre: 1.74 L
FEV1FVC-%Change-Post: 6 %
FEV1FVC-%Pred-Pre: 120 %
FEV6-%Change-Post: 0 %
FEV6-%Pred-Post: 61 %
FEV6-%Pred-Pre: 61 %
FEV6-Post: 2.01 L
FEV6-Pre: 2.01 L
FEV6FVC-%Pred-Post: 107 %
FEV6FVC-%Pred-Pre: 107 %
FVC-%Change-Post: 0 %
FVC-%Pred-Post: 56 %
FVC-%Pred-Pre: 56 %
FVC-Post: 2.01 L
FVC-Pre: 2.01 L
Post FEV1/FVC ratio: 92 %
Post FEV6/FVC ratio: 100 %
Pre FEV1/FVC ratio: 87 %
Pre FEV6/FVC Ratio: 100 %
RV % pred: 61 %
RV: 1.48 L
TLC % pred: 57 %
TLC: 3.65 L

## 2022-06-05 NOTE — Patient Instructions (Signed)
Full PFT performed today. °

## 2022-06-05 NOTE — Progress Notes (Signed)
Full PFT performed today. °

## 2022-06-24 NOTE — Progress Notes (Unsigned)
OV 04/30/2022 -transferring from Dr. Christinia Gully to [name] for ILD.  Subjective:  Patient ID: [name], male , DOB: 1941-03-17 , age 81 y.o. , MRN: [id number] , ADDRESS: [place] [id number] PCP [name], MD Patient Care Team: [name], MD as PCP - General (Family Medicine) [name], MD as PCP - Cardiology (Cardiology)  This Provider for this visit: Treatment Team:  Attending Provider: Brand Males, MD    04/30/2022 -   Chief Complaint  Patient presents with   Consult    Dry cough and wheezing at times      HPI [name] 81 y.o. - referred by DR Bo Merino over concerns of ILD.  He saw rheumatology Dr. Lorelee Cover on 03/28/2022.  This is because of stiffness in his joints neck and back..  Diagnosed with degenerative disc disease and osteoarthritis.  In July 2023 he got COVID 19.  Treated as outpatient.  After that he started having some shortness of breath.  When rheumatology reviewed this history in August 2023 they note his previous reports of ILD on CT scan of the chest particularly high-res April 2018.  Last pulmonary function test was in December 2020.  Autoimmune labs were ordered.  This is come back normal and this referral was made.  Therefore is here.  To me he is reporting only cough.  He has chronic cough for the last 5 years.  There are some wheezing with a cough.  He does cough at night he does bring up some sputum that is clear or light yellow.  For the last 1 week his voice is strained.  He has never had hemoptysis.  He does cough when he lies down.  It does affect his voice.  He does clear the throat.  There is no tickle in the back of the throat.  There is barely any shortness of breath except   [facility] Comprehensive ILD Questionnaire  Symptoms:  Reports chronic cough for the last 5 years.    Past Medical History :  = He has a history of prostate cancer that is early - He has  seen Dr. Melvern Sample for low platelets and apparently this resolved - he has chronic mild anemia 12gm% since sept 2023 - MI in 2008 and status post stents - He has chronic cough and is on PPI and H2 blockade.  But he says he does not have any active acid reflux - She did check he has for rheumatoid arthritis but rheumatology notes indicate DJD - He does have sleep apnea uses CPAP. - She had infectious mononucleosis in college in 1960s - She did mark positive for COVID vaccine and COVID disease in July 2023.  He was not hospitalized for COVID -     DIAGNOSIS: OCt 2023 1) myeloproliferative disorder presented [name] leukocytosis with positive JAK2 mutation  with high WBC and splenomegaly - Rx hydroyurea OCt 2023 2) prostate adenocarcinoma currently under the care of Dr. Delrae Sawyers 3) JAK2 mutation was reported to be abnormal consistent with myeloproliferative disorde . FISH study for BCR/ABL was negative.       ROS:  -Positive for fatigue - Positive for arthralgia - 10 pound unintentional weight loss in the last 6 months because of wife cardiac illness.  But he is feeling better because of this - Nonspecific rash  FAMILY HISTORY of LUNG DISEASE:  -Both parents had COPD.  Mother had asthma.  Son at sarcoid.  Rest of family  history is negative.*  PERSONAL EXPOSURE HISTORY:  -Never smoked cigarettes but he did do some passive smoking in the past.  No cigar use no marijuana use no pipe use.  No cocaine use no intravenous drug use  HOME  EXPOSURE and HOBBY DETAILS :  -Lives in a 79 year old home for the last 15 years.  He does have a [place] when he goes on vacation but this only once a year.  He does have a cockatiel bird in his office for the last 20 years.  He spent several hours in a week in that office.  He also cleans the droppings and feathers from the bird cage.  There is no mold anywhere in the system.  He plays a Microbiologist harmonica there is no mold or mildew in this.  OCCUPATIONAL  HISTORY (122 questions) : -He wants rental property.  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  Other than that he has a bird exposure.  -0 he also has a [place] War veteran and is believed to have been exposed to agent orange.  PULMONARY TOXICITY HISTORY (27 items):  -Denies  INVESTIGATIONS: Below Pulmonary function test shows progression between 22018 and 2020  High-resolution CT chest from several years ago in 2018 nonspecific ILD changes but seems more prominent on the CT abdomen lung cuts August 2022.  [Personally visualized        CT Chest data abd lung image Aug 2022  IMPRESSION: 1. No non-contrast CT findings of the abdomen or pelvis to explain right lower quadrant pain. No evidence of abdominal hernia. 2. Normal appendix. 3. No evidence of urinary tract calculus or hydronephrosis. 4. Mild prostatomegaly with thickening of the urinary bladder wall, likely related to chronic outlet obstruction. 5. Moderate fibrotic scarring of the included bilateral lung bases, which appears somewhat increased compared to prior examination 05/19/2017. Consider pulmonary referral and dedicated chest interstitial lung disease CT of the chest to further evaluate. 6. Coronary artery disease.   Aortic Atherosclerosis (ICD10-I70.0).     Electronically Signed   By: Eddie Candle M.D.   On: 04/02/2021 13:05  No results found.        OV 06/25/2022  Subjective:  Patient ID: [name], male , DOB: 1941-07-29 , age 81 y.o. , MRN: [id number] , ADDRESS: [place] [id number] PCP [name], MD Patient Care Team: [name], MD as PCP - General (Family Medicine) [name], MD as PCP - Cardiology (Cardiology)  This Provider for this visit: Treatment Team:  Attending Provider: Brand Males, MD    06/25/2022 -   Chief Complaint  Patient  presents with   Follow-up    Pt states he has had a cough that is not going away and also states that he has had postnasal drainage. States his cough is worse in the mornings and also at night when laying down for bed. States he will cough up a lot of clear phlegm in the mornings.   History of sleep apnea on CPAP [Dr. Elisabeth Cara Sood]  Follow-up interstitial lung disease: Progressive phenotype [IPF versus chronic HP)  -Features of IPF include age greater than 56, Caucasian, male gender, occupation Dispensing optician work  -Features for chronic HP include: Having a Loss adjuster, chartered bird in the house.  HPI NAJI MEHRINGER 81 y.o. -returns for follow-up.  He is here to discuss his work-up.  His high-resolution CT chest is described as definitive UIP and with definitive progression.  There is also progression his pulmonary function test.  25% decline in the last 5 years averaging 5% decline in FVC per year.  His serology is negative.  This along with age greater than 66, Velcro crackles in the lung base, white male would make him have a diagnosis of IPF.  However on my personal visualization while I do agree with progression I am not sure about the definite of UIP features.  In addition there is?  Specks of pleural calcification.  This would raise the question of asbestosis in the context of exposure of bird question of hypersensitive pneumonitis is there.  I have therefore sought the opinion of Dr. Yetta Glassman radiologist via email.  There are several issues to deal with at this visit  -New diagnosis of myeloproliferative disorder with JAK2 mutation October 2023 by Dr. [name].  Started on hydroxyurea.  Sophia is tolerating it well.  Is been on it for 3 weeks.  No nausea but has been given some Zofran for this or Compazine.  -He is also having epistaxis through the right nostril for the last several months.  At least 1 time per week.  It is intermittent.  Sometimes it is every other week.  No specific trigger factor other  than the fact his nose does feel dry with the CPAP.  He does not use any saline nasal spray.  But it is definitely new.  He is on aspirin.  -Cough: This is worse compared to the last visit.  It is there early in the morning and later in the evening.  Associated with sinus drainage.  He is on chlorpheniramine tablets but it is not working.  In the past he has had RAST allergy profile his blood eosinophils were normal.  He does have some house dust mite allergy and that was the only positive test but otherwise normal.  He does take some cough drops and it does help.  He also feels the cough is stuck in the throat and associated with sinus drainage.  He has never seen the ENT doctor  -Shortness of breath: He had new onset insidious onset of shortness of breath with exertion relieved by rest.  Severe it is listed below.  It is not the last visit.  No associated chest pain  -Unintentional weight loss for the last 6 months of at least 10 pounds.  Current weight is 164 pounds.    SYMPTOM SCALE - ILD 04/30/2022 06/25/2022 164 pounds  Current weight    O2 use ra ra  Shortness of Breath 0 -> 5 scale with 5 being worst (score 6 If unable to do)   At rest 0 0  Simple tasks - showers, clothes change, eating, shaving 0 1  Household (dishes, doing bed, laundry) 0 0  Shopping 0 0  Walking level at own pace 0 1  Walking up Stairs 0 1  Total (30-36) Dyspnea Score 0 3      Non-dyspnea symptoms (0-> 5 scale) 04/30/2022 06/25/2022   How bad is your cough? 2 3  How bad is your fatigue 2 2  How bad is nausea 0 0  How bad is vomiting?  0 0  How bad is diarrhea? 0 0  How bad is anxiety? 0 0  How bad is depression 0 0  Any chronic pain - if so where and how bad 2 0       Simple office  walk 185 feet x  3 laps goal with forehead probe 04/30/2022    O2 used ra   Number laps completed 3   Comments about pace avg   Resting Pulse Ox/HR 98% and 75/min   Final Pulse Ox/HR 95% and 99/min   Desaturated </=  88% no   Desaturated <= 3% points yes   Got Tachycardic >/= 90/min yes   Symptoms at end of test non   Miscellaneous comments x     PFT     Latest Ref Rng & Units 06/05/2022    3:46 PM 07/29/2019    8:58 AM 10/17/2016    9:59 AM  PFT Results  FVC-Pre L 2.01  2.57  2.73   FVC-Predicted Pre % 56  70  73   FVC-Post L 2.01  2.60  2.76   FVC-Predicted Post % 56  71  74   Pre FEV1/FVC % % 87  87  88   Post FEV1/FCV % % 92  90  87   FEV1-Pre L 1.74  2.23  2.39   FEV1-Predicted Pre % 69  85  89   FEV1-Post L 1.85  2.34  2.40   DLCO uncorrected ml/min/mmHg 11.37  15.24  16.71   DLCO UNC% % 51  68  60   DLCO corrected ml/min/mmHg 12.63   16.07   DLCO COR %Predicted % 57   58   DLVA Predicted % 91  98  90   TLC L 3.65  3.89  4.37   TLC % Predicted % 57  61  69   RV % Predicted % 61  53  59    HRCT 05/04/22  Narrative & Impression  CLINICAL DATA:  Interstitial lung disease.   EXAM: CT CHEST WITHOUT CONTRAST   TECHNIQUE: Multidetector CT imaging of the chest was performed following the standard protocol without intravenous contrast. High resolution imaging of the lungs, as well as inspiratory and expiratory imaging, was performed.   RADIATION DOSE REDUCTION: This exam was performed according to the departmental dose-optimization program which includes automated exposure control, adjustment of the mA and/or kV according to patient size and/or use of iterative reconstruction technique.   COMPARISON:  11/26/2016.   FINDINGS: Cardiovascular: Atherosclerotic calcification of the aorta, aortic valve and coronary arteries. Enlarged pulmonary trunk and heart. No pericardial effusion.   Mediastinum/Nodes: Mediastinal lymph nodes measure up to 10 mm in the low right paratracheal station, as before. Hilar regions are difficult to evaluate without IV contrast. No axillary adenopathy. Esophagus is grossly unremarkable.   Lungs/Pleura: Peripheral and basilar predominant  subpleural reticulation, traction bronchiectasis/bronchiolectasis, ground-glass and single layer honeycombing. Findings have progressed from 11/26/2016. Calcified granulomas. No suspicious pulmonary nodules. No pleural fluid. Airway is unremarkable. Minimal trapping.   Upper Abdomen: Right hemidiaphragm is elevated. 10 mm low-attenuation lesion in the right hepatic lobe, too small to characterize but likely a cyst. Visualized portions of the liver, gallbladder, adrenal glands and right kidney are unremarkable. Spleen appears enlarged but is incompletely imaged. Visualized portions of the pancreas, stomach and bowel are unremarkable with the exception of a small hiatal hernia. No upper abdominal adenopathy.   Musculoskeletal: Degenerative changes in the spine. No worrisome lytic or sclerotic lesions.   IMPRESSION: 1. Pulmonary parenchymal pattern of fibrosis, as detailed above, progressive from 11/16/2016. Findings are consistent with UIP per consensus guidelines: Diagnosis of Idiopathic Pulmonary Fibrosis: An Official ATS/ERS/JRS/ALAT Clinical Practice Guideline. [place] 198, Iss 5, [id number], Apr 12 2017. 2. Minimal air trapping indicative of small airways disease. 3. Spleen appears enlarged but is incompletely imaged. 4. Aortic atherosclerosis (ICD10-I70.0). Coronary artery calcification. 5. Enlarged pulmonic trunk, indicative of pulmonary arterial hypertension.     Electronically Signed   By: Lorin Picket M.D.   On: 05/06/2022 10:39    Latest Reference Range & Units 04/24/22 14:24  Anti Nuclear Antibody (ANA) NEGATIVE  NEGATIVE  Cyclic Citrullin Peptide Ab UNITS <16  ds DNA Ab IU/mL <1  RA Latex Turbid. <14 IU/mL <14  ENA SM Ab Ser-aCnc <1.0 NEG AI <1.0 NEG  C3 Complement 82 - 185 mg/dL 136  C4 Complement 15 - 53 mg/dL 44  Ribonucleic Protein(ENA) Antibody, IgG <1.0 NEG AI <1.0 NEG  SSA (Ro) (ENA) Antibody, IgG <1.0 NEG AI <1.0 NEG  SSB (La)  (ENA) Antibody, IgG <1.0 NEG AI <1.0 NEG  Scleroderma (Scl-70) (ENA) Antibody, IgG <1.0 NEG AI <1.0 NEG   ECHO 05/01/22   IMPRESSIONS     1. Left ventricular ejection fraction, by estimation, is 60 to 65%. The  left ventricle has normal function. The left ventricle has no regional  wall motion abnormalities. Left ventricular diastolic parameters were  normal.   2. Right ventricular systolic function is normal. The right ventricular  size is normal. There is normal pulmonary artery systolic pressure. The  estimated right ventricular systolic pressure is 16.6 mmHg.   3. The mitral valve is normal in structure. Trivial mitral valve  regurgitation. No evidence of mitral stenosis.   4. The aortic valve is tricuspid. Aortic valve regurgitation is not  visualized. Aortic valve sclerosis is present, with no evidence of aortic  valve stenosis.   5. The inferior vena cava is normal in size with greater than 50%  respiratory variability, suggesting right atrial pressure of 3 mmHg.     has a past medical history of Abnormal chest x-ray, Allergic rhinitis, Allergy, Ankylosing spondylitis ([place]), Anxiety, Arthritis, Colonic polyp, Coronary atherosclerosis of native coronary artery, Cough, Dizziness (10/21/2016), Ganglion cyst of wrist, Heart attack ([place]) (2008), Hemorrhoids, History of malaria, Hyperlipidemia, Hypertension, Kidney stone, Other disorder of muscle, ligament, and fascia, Rotator cuff tear, and Swelling of lower extremity (10/16/2015).   reports that he has never smoked. He has never been exposed to tobacco smoke. He has never used smokeless tobacco.  Past Surgical History:  Procedure Laterality Date   bicep rupture     CORONARY ANGIOPLASTY WITH STENT PLACEMENT     HEMORRHOID SURGERY  05/2009   in [facility] [place]   LEFT HEART CATH AND CORONARY ANGIOGRAPHY N/A 01/14/2017   Procedure: Left Heart Cath and Coronary Angiography;  Surgeon: [name], MD;  Location: [place] CV LAB;  Service:  Cardiovascular;  Laterality: N/A;   LEFT HEART CATHETERIZATION WITH CORONARY ANGIOGRAM N/A 09/03/2012   Procedure: LEFT HEART CATHETERIZATION WITH CORONARY ANGIOGRAM;  Surgeon: [name], MD;  Location: [facility] CATH LAB;  Service: Cardiovascular;  Laterality: N/A;   PROSTATE BIOPSY     ROTATOR CUFF REPAIR     bilat by Dr. French Ana    Allergies  Allergen Reactions   Celebrex [Celecoxib]     Pt states precipitated his heart attack.   Tramadol Other (See Comments)    Makes patient jumpy    Immunization History  Administered Date(s) Administered   Fluad Quad(high Dose 65+) 05/01/2019, 05/28/2021, 05/01/2022   Influenza Split 06/01/2012   Influenza Whole 05/30/2009, 04/20/2010   Influenza, High Dose Seasonal PF 05/19/2017, 05/19/2018   Influenza-Unspecified 05/13/2007, 07/12/2013, 05/10/2016, 06/23/2020  PFIZER(Purple Top)SARS-COV-2 Vaccination 09/18/2019, 10/09/2019, 07/13/2020   PNEUMOCOCCAL CONJUGATE-20 01/14/2022   Pneumococcal Conjugate-13 05/19/2018   Pneumococcal Polysaccharide-23 08/22/2009   Tdap 12/04/2010, 06/04/2011, 07/31/2018   Zoster Recombinat (Shingrix) 01/14/2022   Zoster, Live 05/17/2008   Zoster, Unspecified 04/17/2022    Family History  Problem Relation Age of Onset   Cancer Mother        melanoma   Emphysema Mother    Rheum arthritis Mother    Diabetes Mother    Alcohol abuse Father      Current Outpatient Medications:    amLODipine (NORVASC) 2.5 MG tablet, Take 1 tablet (2.5 mg total) by mouth daily., Disp: 90 tablet, Rfl: 1   Apoaequorin (PREVAGEN PO), Take 1 tablet by mouth daily. , Disp: , Rfl:    aspirin 81 MG EC tablet, Take 81 mg by mouth daily., Disp: , Rfl:    budesonide-formoterol (SYMBICORT) 80-4.5 MCG/ACT inhaler, Inhale 2 puffs into the lungs 2 (two) times daily. (Patient taking differently: Inhale 2 puffs into the lungs as needed.), Disp: 1 each, Rfl: 3   Cholecalciferol (VITAMIN D3) 1.25 MG (50000 UT) CAPS, Take 1 capsule by mouth  daily. Pt does not know dose., Disp: , Rfl:    famotidine (PEPCID) 20 MG tablet, Take by mouth., Disp: , Rfl:    hydroxyurea (HYDREA) 500 MG capsule, Take 1 capsule (500 mg total) by mouth daily. May take with food to minimize GI side effects., Disp: 30 capsule, Rfl: 3   Multiple Vitamins-Minerals (ICAPS AREDS 2) CAPS, Take by mouth daily., Disp: , Rfl:    pantoprazole (PROTONIX) 40 MG tablet, TAKE 1 TABLET 1 TIME DAILY 30 TO 60 MINUTES BEFORE FIRST MEAL OF THE DAY, Disp: 90 tablet, Rfl: 3   simvastatin (ZOCOR) 20 MG tablet, TAKE 1 TABLET EVERY DAY WITH BREAKFAST (APPOINTMENT IS NEEDED FOR REFILLS), Disp: 90 tablet, Rfl: 3   UNABLE TO FIND, Med Name: CPAP per Dr Halford Chessman, Disp: , Rfl:    valACYclovir (VALTREX) 500 MG tablet, Take 1 tablet (500 mg total) by mouth 2 (two) times daily. Take at 1st sign of penile rash., Disp: 6 tablet, Rfl: 1   prochlorperazine (COMPAZINE) 10 MG tablet, Take 1 tablet (10 mg total) by mouth every 6 (six) hours as needed for nausea or vomiting. (Patient not taking: Reported on 06/25/2022), Disp: 30 tablet, Rfl: 0      Objective:   Vitals:   06/25/22 0845  BP: (!) 124/58  Pulse: 66  Temp: 97.8 F (36.6 C)  TempSrc: Oral  SpO2: 95%  Weight: 164 lb (74.4 kg)  Height: '5\' 7"'$  (1.702 m)    Estimated body mass index is 25.69 kg/m as calculated from the following:   Height as of this encounter: '5\' 7"'$  (1.702 m).   Weight as of this encounter: 164 lb (74.4 kg).  '@WEIGHTCHANGE'$ @  Filed Weights   06/25/22 0845  Weight: 164 lb (74.4 kg)     Physical Exam    General: No distress. Looks well Neuro: Alert and Oriented x 3. GCS 15. Speech normal Psych: Pleasant Resp:  Barrel Chest - no.  Wheeze - no, Crackles - YES VELCRO BASE, No overt respiratory distress CVS: Normal heart sounds. Murmurs - no Ext: Stigmata of Connective Tissue Disease - no HEENT: Normal upper airway. PEERL +. No post nasal drip        Assessment:       ICD-10-CM   1. ILD (interstitial  lung disease) ([place])  J84.9     2.  Long-term exposure involving bird droppings  Z77.29     3. Chronic cough  R05.3     4. Runny nose  R09.89     5. Epistaxis  R04.0     6. House dust mite allergy  Z91.09      Most likely has IPF.  But I do need a opinion from Dr. Burt Ek about a CT scan before I give the final diagnosis.  Regardless it is progressive and therefore antifibrotic's are indicated.  He does need to get rid of the bird which would be a trigger factor.  He is already on hydroxyurea and is losing weight.  Hydroxyurea causes nausea and so far has not had any nausea vomiting from it.  But he is definitely losing weight.  The current antifibrotic's nintedanib causes diarrhea and sometimes nausea and vomiting and definitely contributes to fatigue and weight loss over time.  The other antifibrotic that is approved pirfenidone causes vomiting nausea low appetite and also fatigue over time.  So both overall have GI side effects.  We discussed this.  Between the 2 he prefers diarrhea as a side effect but he did want me to get clarification from Dr. [name] and I have reached out to Dr. [name].    We discussed the other option of going on a clinical trial with modified pirfenidone (deutified).  In this protocol for the first 6 months he would be randomized to placebo versus actual pirfenidone versus study drug which has less than 10% of GI side effect rate much lower than standard of care therapy.  And then after 6 months he will be randomized to 2 doses of the research drug but no placebo.  He is aware in this protocol that for the first 6 months there is a chance he will get placebo 33% and he will be at risk for continued decline and it is really a drug that is being evaluated for approval.  He understands he will be voluntary.   Meanwhile for his weight loss: Have asked him to reach out to his primary care physician  For his quality of life issues: We will check his overnight pulse  oximetry.  Regarding his cough: He feels everything got worse after the COVID but he does have a previous house dust mite allergy.  He also has runny nose and dry nose.  He also has epistaxis.  He also feels there is an element of upper airway cough.  Therefore of asked him to reach out to ENT.  Making a referral but he thinks he might already have 1 through primary care physician.    Plan:     Patient Instructions  ILD (interstitial lung disease) ([place]) Long-term exposure involving bird droppings  - This is either IPF or chronic HP  - regardless it is progressive; 25% decline in lung function in 5 years  Plan   -Test overnight pulse oximetry on room air -I will get a second opinion on his CT scan of the chest from another radiologist -We discussed 3 therapeutic options (If dx is IPF) -and this includes nintedanib [diarrhea side effect] versus pirfenidone [nausea vomiting side effect] versus enrollment in a clinical trial with modified pirfenidone (lower side effect profile]  -Final decision depending on my discussions with Dr. [name] and radiology second opinion and also your reading of the research study consent form  -Regardless you do need to get rid of your bird   Chronic cough History of dust mite allergy 2018 Runny nose Epistaxis   -  The chronic cough can be because of runny nose and dry nose -Also the bird exposure could be contributing to the cough  Plan  - Remove bird from the house = Refer ENT -Use saline nasal spray [buy it over-the-counter]  Follow-up - Video visit with [name] in 2-4 weeks to discuss next steps  -This visit can be when I am in the hospital rotation 07/19/2022    SIGNATURE    Dr. Brand Males, M.D., F.C.C.P,  Pulmonary and Critical Care Medicine Staff Physician, [place] Director - Interstitial Lung Disease  Program  Pulmonary [facility] [place], [place],  [address]  Pager: [phone], If no answer or between  15:00h - 7:00h: call [phone] Telephone: [phone]  1:38 PM 06/25/2022

## 2022-06-24 NOTE — Patient Instructions (Signed)
ILD (interstitial lung disease) (HCC) Long-term exposure involving bird droppings  - This is either IPF or chronic HP  - regardless it is progressive; 25% decline in lung function in 5 years  Plan   -Test overnight pulse oximetry on room air -I will get a second opinion on his CT scan of the chest from another radiologist -We discussed 3 therapeutic options (If dx is IPF) -and this includes nintedanib [diarrhea side effect] versus pirfenidone [nausea vomiting side effect] versus enrollment in a clinical trial with modified pirfenidone (lower side effect profile]  -Final decision depending on my discussions with Dr. Arbutus Ped and radiology second opinion and also your reading of the research study consent form  -Regardless you do need to get rid of your bird   Chronic cough History of dust mite allergy 2018 Runny nose Epistaxis   -The chronic cough can be because of runny nose and dry nose -Also the bird exposure could be contributing to the cough  Plan  - Remove bird from the house = Refer ENT -Use saline nasal spray [buy it over-the-counter]  Unintentional weight loss   Follow-up - Video visit with Dr. Marchelle Gearing in 2-4 weeks to discuss next steps  -This visit can be when I am in the hospital rotation 07/19/2022

## 2022-06-25 ENCOUNTER — Ambulatory Visit: Payer: Medicare HMO | Admitting: Internal Medicine

## 2022-06-25 ENCOUNTER — Encounter: Payer: Self-pay | Admitting: Internal Medicine

## 2022-06-25 ENCOUNTER — Telehealth: Payer: Self-pay | Admitting: Internal Medicine

## 2022-06-25 VITALS — BP 124/58 | HR 66 | Temp 97.8°F | Ht 67.0 in | Wt 164.0 lb

## 2022-06-25 DIAGNOSIS — R634 Abnormal weight loss: Secondary | ICD-10-CM | POA: Diagnosis not present

## 2022-06-25 DIAGNOSIS — R0989 Other specified symptoms and signs involving the circulatory and respiratory systems: Secondary | ICD-10-CM

## 2022-06-25 DIAGNOSIS — Z7729 Contact with and (suspected ) exposure to other hazardous substances: Secondary | ICD-10-CM

## 2022-06-25 DIAGNOSIS — R040 Epistaxis: Secondary | ICD-10-CM

## 2022-06-25 DIAGNOSIS — Z9109 Other allergy status, other than to drugs and biological substances: Secondary | ICD-10-CM

## 2022-06-25 DIAGNOSIS — J849 Interstitial pulmonary disease, unspecified: Secondary | ICD-10-CM

## 2022-06-25 DIAGNOSIS — R053 Chronic cough: Secondary | ICD-10-CM

## 2022-06-25 NOTE — Telephone Encounter (Signed)
[name]  Let [name] - 11 know that I discussed Dr. [name].  He feels hydroxyurea while it can cause nausea and vomiting but given the low-dose it is unlikely to cause any problems for him and the incidence is really low.  Therefore in terms of antifibrotic choice between nintedanib [diarrhea] versus pirfenidone [nausea/vomiting/low appetite] versus clinical trial he has left it up to the patient.  Also waiting to hear from the radiologist.

## 2022-06-25 NOTE — Telephone Encounter (Signed)
Attempted to call pt but unable to reach. Left message for him to return call. °

## 2022-06-27 ENCOUNTER — Telehealth: Payer: Self-pay | Admitting: Medical Oncology

## 2022-06-27 NOTE — Telephone Encounter (Signed)
Called and spoke with pt letting him know the info per MR and he verbalized understanding. Nothing further needed. ?

## 2022-06-27 NOTE — Telephone Encounter (Signed)
Hydrea refill picked up by pt so he can  continue to take it up to his appt with [facility] in December.

## 2022-07-01 ENCOUNTER — Telehealth: Payer: Self-pay | Admitting: Internal Medicine

## 2022-07-01 NOTE — Telephone Encounter (Signed)
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  CT worse since 2018  Plan   - get rid of birds  - no need for purtech study - - preferred drug for this is Ofev the one tht can cause diarrhe - was his preferred drug - let him now of above and get Devki to start - change my mid dec 2023 visti to mid Jan 2024 but if he wants to keep it he may   Past Medical History:  Diagnosis Date   Abnormal chest x-ray    Allergic rhinitis    Allergy    Ankylosing spondylitis ([place])    Anxiety    Arthritis    Colonic polyp    Coronary atherosclerosis of native coronary artery    Cough    Dizziness 10/21/2016   Ganglion cyst of wrist    left   Heart attack ([place]) 2008   Hemorrhoids    History of malaria    Hyperlipidemia    with low HDL   Hypertension    Kidney stone    Other disorder of muscle, ligament, and fascia    Rotator cuff tear    Swelling of lower extremity 10/16/2015    Current Outpatient Medications:    amLODipine (NORVASC) 2.5 MG tablet, Take 1 tablet (2.5 mg total) by mouth daily., Disp: 90 tablet, Rfl: 1   Apoaequorin (PREVAGEN PO), Take 1 tablet by mouth daily. , Disp: , Rfl:    aspirin 81 MG EC tablet, Take 81 mg by mouth daily., Disp: , Rfl:    budesonide-formoterol (SYMBICORT) 80-4.5 MCG/ACT inhaler, Inhale 2 puffs into the lungs 2 (two) times daily. (Patient taking differently: Inhale 2 puffs into the lungs as needed.), Disp: 1 each, Rfl: 3   Cholecalciferol (VITAMIN D3) 1.25 MG (50000 UT) CAPS, Take 1 capsule by mouth daily. Pt does not know dose., Disp: , Rfl:    famotidine (PEPCID) 20 MG tablet, Take by mouth., Disp: , Rfl:    hydroxyurea (HYDREA) 500 MG capsule, Take 1 capsule (500 mg total) by mouth daily. May take with food to minimize GI side effects., Disp: 30 capsule, Rfl: 3   Multiple Vitamins-Minerals (ICAPS AREDS 2) CAPS, Take by mouth daily., Disp: , Rfl:    pantoprazole (PROTONIX) 40  MG tablet, TAKE 1 TABLET 1 TIME DAILY 30 TO 60 MINUTES BEFORE FIRST MEAL OF THE DAY, Disp: 90 tablet, Rfl: 3   prochlorperazine (COMPAZINE) 10 MG tablet, Take 1 tablet (10 mg total) by mouth every 6 (six) hours as needed for nausea or vomiting. (Patient not taking: Reported on 06/25/2022), Disp: 30 tablet, Rfl: 0   simvastatin (ZOCOR) 20 MG tablet, TAKE 1 TABLET EVERY DAY WITH BREAKFAST (APPOINTMENT IS NEEDED FOR REFILLS), Disp: 90 tablet, Rfl: 3   UNABLE TO FIND, Med Name: CPAP per Dr Halford Chessman, Disp: , Rfl:    valACYclovir (VALTREX) 500 MG tablet, Take 1 tablet (500 mg total) by mouth 2 (two) times daily. Take at 1st sign of penile rash., Disp: 6 tablet, Rfl: 1   Allergies  Allergen Reactions   Celebrex [Celecoxib]     Pt states precipitated his heart attack.   Tramadol Other (See Comments)    Makes patient jumpy      has a past surgical history that includes Rotator cuff repair; Hemorrhoid surgery (05/2009); left heart catheterization with coronary angiogram (N/A, 09/03/2012); LEFT HEART CATH AND CORONARY ANGIOGRAPHY (N/A,  01/14/2017); Coronary angioplasty with stent; bicep rupture; and Prostate biopsy.

## 2022-07-02 NOTE — Telephone Encounter (Signed)
Routing to SG as MR is not in office this afternoon.

## 2022-07-02 NOTE — Telephone Encounter (Signed)
ATC LVMTCB x 1  

## 2022-07-02 NOTE — Telephone Encounter (Signed)
Spoke with pt and helped clarify My Chart message that started on 06/13/22. Pt stated understanding of message. Pharmacy can you please assist with starting Ofev for pt? Pt would like to keep mid December My Chart visit.   Pt also stated he has coughed up blood twice in the last couple months and both wheezing and SOB have increased since last visit. Pt denies any other resp symptoms. [name] please advise.

## 2022-07-02 NOTE — Telephone Encounter (Signed)
Patient was just seen by [name] in the office last week.  In noted says no hemoptysis.  However if patient is coughing up blood and has been going on for the last few months he will need an office visit to discuss this in detail and see if additional work-up is indicated.  Recent CT chest in September showed his underlying fibrosis. If he is coughing up blood will need office visit to further evaluate

## 2022-07-03 NOTE — Telephone Encounter (Signed)
Attempted to call pt but unable to reach. Left message for him to return call. °

## 2022-07-05 NOTE — Telephone Encounter (Signed)
Attempted to call pt but unable to reach. Left message for him to return call.  Due to multiple attempts trying to reach pt without being able to do so, per protocol encounter will be closed.

## 2022-07-11 ENCOUNTER — Ambulatory Visit: Payer: Medicare HMO | Admitting: Internal Medicine

## 2022-07-11 ENCOUNTER — Other Ambulatory Visit: Payer: Medicare HMO

## 2022-07-16 ENCOUNTER — Telehealth: Payer: Self-pay | Admitting: Pharmacist

## 2022-07-16 NOTE — Telephone Encounter (Signed)
Talked to patient on phone. Apparently patient had decided on moving forward with Ofev however pharmacy team was never notified. He expressed significant frustration with lack of communication from our clinic. I apologized and advised we would start prior authorization asap.   I spoke with him about patient assistance. He states he is significantly exceeds income criteria for PAP so would not qualify. Would like to run benefits and get a quote and decide from there.  Per last OV note, dx of chronic HP vs IPF was being revisited by [name] with further discussion with radiologist. Don't see any follow-up on this. If non-IPF diagnosis, I did advise patient that pirfenidone would likely be denied and additionally he would not qualify for patient assistance anyways based on income alone  Submitted a Prior Authorization request to [facility] for OFEV via CoverMyMeds. Will update once we receive a response.  Key: Roslyn Smiling, PharmD, MPH, BCPS, CPP Clinical Pharmacist (Rheumatology and Pulmonology)

## 2022-07-16 NOTE — Telephone Encounter (Signed)
Given the fact that September 2023 CT scan of the chest did not show any evidence of lung cancer I do not think something like that is making him cough up blood.  On the other hand it could be bronchitis from the fibrosis  Plan - Definitely proceed with nintedanib - Please ask him to come and get a chest x-ray sometime between now and the next visit - If he is having large amounts of hemoptysis he needs to go to the ER - Based on chest x-ray results I can try to expedite his care but otherwise I can discuss on 07/29/2022 -Possible he needs a bronchoscopy

## 2022-07-16 NOTE — Telephone Encounter (Signed)
Came to [facility] pool by mistake, please see patients msg appears urgent

## 2022-07-16 NOTE — Telephone Encounter (Signed)
Mychart message sent by pt: [name]  P [facility] Clinical Pool (supporting You)Yesterday (10:47 AM)    I have yet to hear from [facility] regarding what the procedure is for starting Ofev.   Patient:  [name] DOB:  02-07-2041   This is a response to your email of Nov. 20th     Routing to pharmacy team. Please advise.

## 2022-07-16 NOTE — Telephone Encounter (Signed)
Cassandria Anger, RPH-CPP  You22 minutes ago (2:14 PM)    Pharmacy team was never notified of this new start. Patient expressed frustration with lack of communication from our clinic.  Please be advised patient continue to have hemoptysis. I advised him to seek care in ER if worse. If [name] is doing labwork at upcoming visit, won't labs be closed by then? Is there any availability sooner in the day or another day for patient?     Pt's current visit with [name] is the soonest that we are able to get him in to see [name].  If labs need to be done on a separate day as pt's visit is a mychart video visit, not an in office visit.  Routing this to MR as an FYI about the hemoptysis. Also see response from Rexene Edison, NP 11/21. Pt was attempted to be contacted multiple times about this but was not able to be reached.

## 2022-07-17 ENCOUNTER — Inpatient Hospital Stay: Payer: Medicare HMO | Attending: Internal Medicine

## 2022-07-17 ENCOUNTER — Other Ambulatory Visit: Payer: Self-pay

## 2022-07-17 ENCOUNTER — Inpatient Hospital Stay: Payer: Medicare HMO | Admitting: Internal Medicine

## 2022-07-17 VITALS — BP 147/64 | HR 88 | Temp 98.6°F | Resp 15 | Wt 162.8 lb

## 2022-07-17 DIAGNOSIS — D471 Chronic myeloproliferative disease: Secondary | ICD-10-CM | POA: Insufficient documentation

## 2022-07-17 DIAGNOSIS — C61 Malignant neoplasm of prostate: Secondary | ICD-10-CM | POA: Diagnosis not present

## 2022-07-17 DIAGNOSIS — D649 Anemia, unspecified: Secondary | ICD-10-CM | POA: Diagnosis not present

## 2022-07-17 DIAGNOSIS — R161 Splenomegaly, not elsewhere classified: Secondary | ICD-10-CM | POA: Insufficient documentation

## 2022-07-17 DIAGNOSIS — D696 Thrombocytopenia, unspecified: Secondary | ICD-10-CM

## 2022-07-17 DIAGNOSIS — D72829 Elevated white blood cell count, unspecified: Secondary | ICD-10-CM | POA: Insufficient documentation

## 2022-07-17 LAB — CBC WITH DIFFERENTIAL (CANCER CENTER ONLY)
Abs Immature Granulocytes: 2.48 10*3/uL — ABNORMAL HIGH (ref 0.00–0.07)
Basophils Absolute: 0.3 10*3/uL — ABNORMAL HIGH (ref 0.0–0.1)
Basophils Relative: 2 %
Eosinophils Absolute: 0 10*3/uL (ref 0.0–0.5)
Eosinophils Relative: 0 %
HCT: 38.2 % — ABNORMAL LOW (ref 39.0–52.0)
Hemoglobin: 11.9 g/dL — ABNORMAL LOW (ref 13.0–17.0)
Immature Granulocytes: 14 %
Lymphocytes Relative: 6 %
Lymphs Abs: 1 10*3/uL (ref 0.7–4.0)
MCH: 29.5 pg (ref 26.0–34.0)
MCHC: 31.2 g/dL (ref 30.0–36.0)
MCV: 94.6 fL (ref 80.0–100.0)
Monocytes Absolute: 1.1 10*3/uL — ABNORMAL HIGH (ref 0.1–1.0)
Monocytes Relative: 6 %
Neutro Abs: 13 10*3/uL — ABNORMAL HIGH (ref 1.7–7.7)
Neutrophils Relative %: 72 %
Platelet Count: 201 10*3/uL (ref 150–400)
RBC: 4.04 MIL/uL — ABNORMAL LOW (ref 4.22–5.81)
RDW: 25.2 % — ABNORMAL HIGH (ref 11.5–15.5)
WBC Count: 17.8 10*3/uL — ABNORMAL HIGH (ref 4.0–10.5)
nRBC: 0.2 % (ref 0.0–0.2)

## 2022-07-17 LAB — CMP (CANCER CENTER ONLY)
ALT: 18 U/L (ref 0–44)
AST: 23 U/L (ref 15–41)
Albumin: 4.5 g/dL (ref 3.5–5.0)
Alkaline Phosphatase: 94 U/L (ref 38–126)
Anion gap: 6 (ref 5–15)
BUN: 14 mg/dL (ref 8–23)
CO2: 31 mmol/L (ref 22–32)
Calcium: 10 mg/dL (ref 8.9–10.3)
Chloride: 103 mmol/L (ref 98–111)
Creatinine: 0.78 mg/dL (ref 0.61–1.24)
GFR, Estimated: >60 mL/min (ref >60)
Glucose, Bld: 108 mg/dL — ABNORMAL HIGH (ref 70–99)
Potassium: 4.1 mmol/L (ref 3.5–5.1)
Sodium: 140 mmol/L (ref 135–145)
Total Bilirubin: 0.7 mg/dL (ref 0.3–1.2)
Total Protein: 7 g/dL (ref 6.5–8.1)

## 2022-07-17 LAB — LACTATE DEHYDROGENASE: LDH: 384 U/L — ABNORMAL HIGH (ref 98–192)

## 2022-07-17 NOTE — Telephone Encounter (Signed)
Clinical questions failed to populate.  Called [employer] to re-initiate PA for Ofev on phone. Per automated system, still in process. Determination will be received within 72 hours.  Phone: [phone] EOC ID: [id number] [email or web address]  Knox Saliva, PharmD, MPH, BCPS, CPP Clinical Pharmacist (Rheumatology and Pulmonology)

## 2022-07-17 NOTE — Progress Notes (Signed)
[name] Telephone:([phone]) [phone]   Fax:(336) [phone]  OFFICE PROGRESS NOTE  [name] Agreste, MD [address] [place]  [address]  DIAGNOSIS:  1) myeloproliferative disorder presented [name] leukocytosis with positive JAK2 mutation  2) prostate adenocarcinoma currently under the care of Dr. [name] 3) interstitial lung disease under the care of [name]  PRIOR THERAPY:None.  CURRENT THERAPY: Hydroxyurea 500 mg p.o. daily.    INTERVAL HISTORY: [name] 81 y.o. male returns to the clinic today for follow-up visit.  The patient was recently diagnosed with interstitial lung disease and he is currently under treatment with [name].  He denied having any current chest pain but continues to have shortness of breath with cough and occasional blood-tinged thick sputum.  He has no recent significant weight loss or night sweats.  He has no nausea, vomiting, diarrhea or constipation.  He is tolerating his current treatment with hydroxyurea fairly well.  The patient is here today for evaluation and repeat blood work.  MEDICAL HISTORY: Past Medical History:  Diagnosis Date   Abnormal chest x-ray    Allergic rhinitis    Allergy    Ankylosing spondylitis (HCC)    Anxiety    Arthritis    Colonic polyp    Coronary atherosclerosis of native coronary artery    Cough    Dizziness 10/21/2016   Ganglion cyst of wrist    left   Heart attack ([place]) 2008   Hemorrhoids    History of malaria    Hyperlipidemia    with low HDL   Hypertension    Kidney stone    Other disorder of muscle, ligament, and fascia    Rotator cuff tear    Swelling of lower extremity 10/16/2015    ALLERGIES:  is allergic to celebrex [celecoxib] and tramadol.  MEDICATIONS:  Current Outpatient Medications  Medication Sig Dispense Refill   amLODipine (NORVASC) 2.5 MG tablet Take 1 tablet (2.5 mg total) by mouth daily. 90 tablet 1   Apoaequorin (PREVAGEN PO) Take 1 tablet by  mouth daily.      aspirin 81 MG EC tablet Take 81 mg by mouth daily.     budesonide-formoterol (SYMBICORT) 80-4.5 MCG/ACT inhaler Inhale 2 puffs into the lungs 2 (two) times daily. 1 each 3   chlorpheniramine (CHLOR-TRIMETON) 4 MG tablet 1 -2 every 4 hours as needed (Patient not taking: Reported on 05/16/2022)     ciclopirox (LOPROX) 0.77 % cream Apply 1 Application topically daily.     famotidine (PEPCID) 20 MG tablet Take by mouth.     ketoconazole (NIZORAL) 2 % cream Apply topically 2 (two) times daily.     Multiple Vitamins-Minerals (ICAPS AREDS 2) CAPS Take by mouth daily.     pantoprazole (PROTONIX) 40 MG tablet TAKE 1 TABLET 1 TIME DAILY 30 TO 60 MINUTES BEFORE FIRST MEAL OF THE DAY 90 tablet 3   simvastatin (ZOCOR) 20 MG tablet TAKE 1 TABLET EVERY DAY WITH BREAKFAST (APPOINTMENT IS NEEDED FOR REFILLS) 90 tablet 3   UNABLE TO FIND Med Name: CPAP per Dr Halford Chessman     valACYclovir (VALTREX) 500 MG tablet Take 1 tablet (500 mg total) by mouth 2 (two) times daily. Take at 1st sign of penile rash. 6 tablet 1   No current facility-administered medications for this visit.    SURGICAL HISTORY:  Past Surgical History:  Procedure Laterality Date   bicep rupture     CORONARY ANGIOPLASTY WITH STENT  PLACEMENT     HEMORRHOID SURGERY  05/2009   in [facility]   LEFT HEART CATH AND CORONARY ANGIOGRAPHY N/A 01/14/2017   Procedure: Left Heart Cath and Coronary Angiography;  Surgeon: [name], MD;  Location: [facility];  Service: Cardiovascular;  Laterality: N/A;   LEFT HEART CATHETERIZATION WITH CORONARY ANGIOGRAM N/A 09/03/2012   Procedure: LEFT HEART CATHETERIZATION WITH CORONARY ANGIOGRAM;  Surgeon: [name], MD;  Location: [facility] CATH LAB;  Service: Cardiovascular;  Laterality: N/A;   PROSTATE BIOPSY     ROTATOR CUFF REPAIR     bilat by Dr. French Ana    REVIEW OF SYSTEMS:  A comprehensive review of systems was negative except for: Constitutional: positive for fatigue Respiratory: positive  for cough and dyspnea on exertion   PHYSICAL EXAMINATION: General appearance: alert, cooperative, fatigued, and no distress Head: Normocephalic, without obvious abnormality, atraumatic Neck: no adenopathy Lymph nodes: Cervical, supraclavicular, and axillary nodes normal. Resp: clear to auscultation bilaterally Back: symmetric, no curvature. ROM normal. No CVA tenderness. Cardio: regular rate and rhythm, S1, S2 normal, no murmur, click, rub or gallop GI: soft, non-tender; bowel sounds normal; no masses,  no organomegaly Extremities: extremities normal, atraumatic, no cyanosis or edema  ECOG PERFORMANCE STATUS: 1 - Symptomatic but completely ambulatory  Blood pressure (!) 138/52, pulse 70, temperature 98.3 F (36.8 C), resp. rate 16, height '5\' 7"'$  (1.702 m), weight 162 lb 5 oz (73.6 kg), SpO2 93 %.  LABORATORY DATA: Lab Results  Component Value Date   WBC 17.8 (H) 07/17/2022   HGB 11.9 (L) 07/17/2022   HCT 38.2 (L) 07/17/2022   MCV 94.6 07/17/2022   PLT 201 07/17/2022      Chemistry      Component Value Date/Time   NA 140 05/07/2022 1500   NA 141 01/03/2020 0832   NA 141 04/21/2017 0823   K 3.9 05/07/2022 1500   K 4.0 04/21/2017 0823   CL 104 05/07/2022 1500   CL 106 09/30/2012 0949   CO2 29 05/07/2022 1500   CO2 25 04/21/2017 0823   BUN 20 05/07/2022 1500   BUN 20 01/03/2020 0832   BUN 20.2 04/21/2017 0823   CREATININE 0.96 05/07/2022 1500   CREATININE 0.9 04/21/2017 0823      Component Value Date/Time   CALCIUM 9.6 05/07/2022 1500   CALCIUM 9.6 04/21/2017 0823   ALKPHOS 81 05/07/2022 1500   ALKPHOS 61 04/21/2017 0823   AST 24 05/07/2022 1500   AST 22 04/21/2017 0823   ALT 20 05/07/2022 1500   ALT 36 04/21/2017 0823   BILITOT 0.8 05/07/2022 1500   BILITOT 0.99 04/21/2017 0823     BONE MARROW REPORT FINAL DIAGNOSIS RADIOGRAPHIC STUDIES: No results found.  ASSESSMENT AND PLAN:  This is a very pleasant 81  years old white male with history of ITP and now  presented with JAK2 positive myeloproliferative disorder.[name] He presented recently with leukocytosis and splenomegaly. I explained to the patient that she has evidence for myeloproliferative disorder based on the JAK2 mutation and the presentation with leukocytosis and splenomegaly.  FISH study for BCR/ABL was negative. The patient started treatment with hydroxyurea 500 mg p.o. daily and he has been tolerating it fairly well. Repeat blood work today showed improvement of his total white blood count down to 17.8.  He also has mild anemia. I recommended for the patient to continue his current treatment with hydroxyurea with the same dose. I will see him back for follow-up visit in 6 weeks for evaluation  with repeat blood work including iron study and ferritin because of his anemia. For the interstitial lung disease he will continue his routine follow-up visit and evaluation by [name]. The patient was advised to call immediately if he has any other concerning symptoms in the interval.  All questions were answered. The patient knows to call the clinic with any problems, questions or concerns. We can certainly see the patient much sooner if necessary.  Disclaimer: This note was dictated with voice recognition software. Similar sounding words can inadvertently be transcribed and may not be corrected upon review.

## 2022-07-17 NOTE — Telephone Encounter (Signed)
ATC patient. LVMTCB. 

## 2022-07-20 ENCOUNTER — Ambulatory Visit (INDEPENDENT_AMBULATORY_CARE_PROVIDER_SITE_OTHER): Payer: Medicare HMO

## 2022-07-20 ENCOUNTER — Ambulatory Visit (HOSPITAL_COMMUNITY)
Admission: EM | Admit: 2022-07-20 | Discharge: 2022-07-20 | Payer: Medicare HMO | Attending: Internal Medicine | Admitting: Internal Medicine

## 2022-07-20 ENCOUNTER — Emergency Department (HOSPITAL_COMMUNITY): Payer: Medicare HMO

## 2022-07-20 ENCOUNTER — Other Ambulatory Visit: Payer: Self-pay

## 2022-07-20 ENCOUNTER — Encounter (HOSPITAL_COMMUNITY): Payer: Self-pay | Admitting: Emergency Medicine

## 2022-07-20 ENCOUNTER — Inpatient Hospital Stay (HOSPITAL_COMMUNITY)
Admission: EM | Admit: 2022-07-20 | Discharge: 2022-07-23 | DRG: 196 | Disposition: A | Discharge status: Home / Self Care | Payer: Medicare HMO | Attending: Internal Medicine | Admitting: Internal Medicine

## 2022-07-20 DIAGNOSIS — Z8719 Personal history of other diseases of the digestive system: Secondary | ICD-10-CM

## 2022-07-20 DIAGNOSIS — J84112 Idiopathic pulmonary fibrosis: Principal | ICD-10-CM | POA: Diagnosis present

## 2022-07-20 DIAGNOSIS — I2510 Atherosclerotic heart disease of native coronary artery without angina pectoris: Secondary | ICD-10-CM | POA: Diagnosis present

## 2022-07-20 DIAGNOSIS — I252 Old myocardial infarction: Secondary | ICD-10-CM | POA: Diagnosis not present

## 2022-07-20 DIAGNOSIS — R7303 Prediabetes: Secondary | ICD-10-CM | POA: Diagnosis present

## 2022-07-20 DIAGNOSIS — R739 Hyperglycemia, unspecified: Secondary | ICD-10-CM | POA: Diagnosis not present

## 2022-07-20 DIAGNOSIS — I700 Atherosclerosis of aorta: Secondary | ICD-10-CM | POA: Diagnosis not present

## 2022-07-20 DIAGNOSIS — Z955 Presence of coronary angioplasty implant and graft: Secondary | ICD-10-CM | POA: Diagnosis not present

## 2022-07-20 DIAGNOSIS — Z885 Allergy status to narcotic agent status: Secondary | ICD-10-CM

## 2022-07-20 DIAGNOSIS — M7989 Other specified soft tissue disorders: Secondary | ICD-10-CM | POA: Diagnosis not present

## 2022-07-20 DIAGNOSIS — T380X5A Adverse effect of glucocorticoids and synthetic analogues, initial encounter: Secondary | ICD-10-CM | POA: Diagnosis not present

## 2022-07-20 DIAGNOSIS — R0902 Hypoxemia: Secondary | ICD-10-CM | POA: Diagnosis not present

## 2022-07-20 DIAGNOSIS — J984 Other disorders of lung: Secondary | ICD-10-CM | POA: Diagnosis present

## 2022-07-20 DIAGNOSIS — L538 Other specified erythematous conditions: Secondary | ICD-10-CM | POA: Diagnosis not present

## 2022-07-20 DIAGNOSIS — Z8613 Personal history of malaria: Secondary | ICD-10-CM

## 2022-07-20 DIAGNOSIS — Z825 Family history of asthma and other chronic lower respiratory diseases: Secondary | ICD-10-CM

## 2022-07-20 DIAGNOSIS — E8770 Fluid overload, unspecified: Secondary | ICD-10-CM | POA: Diagnosis present

## 2022-07-20 DIAGNOSIS — D471 Chronic myeloproliferative disease: Secondary | ICD-10-CM | POA: Diagnosis not present

## 2022-07-20 DIAGNOSIS — J449 Chronic obstructive pulmonary disease, unspecified: Secondary | ICD-10-CM | POA: Diagnosis not present

## 2022-07-20 DIAGNOSIS — R52 Pain, unspecified: Secondary | ICD-10-CM | POA: Diagnosis not present

## 2022-07-20 DIAGNOSIS — Y92239 Unspecified place in hospital as the place of occurrence of the external cause: Secondary | ICD-10-CM | POA: Diagnosis not present

## 2022-07-20 DIAGNOSIS — J479 Bronchiectasis, uncomplicated: Secondary | ICD-10-CM | POA: Diagnosis not present

## 2022-07-20 DIAGNOSIS — Z1152 Encounter for screening for COVID-19: Secondary | ICD-10-CM

## 2022-07-20 DIAGNOSIS — I10 Essential (primary) hypertension: Secondary | ICD-10-CM | POA: Diagnosis not present

## 2022-07-20 DIAGNOSIS — J441 Chronic obstructive pulmonary disease with (acute) exacerbation: Secondary | ICD-10-CM

## 2022-07-20 DIAGNOSIS — Z7722 Contact with and (suspected) exposure to environmental tobacco smoke (acute) (chronic): Secondary | ICD-10-CM | POA: Diagnosis present

## 2022-07-20 DIAGNOSIS — L03115 Cellulitis of right lower limb: Secondary | ICD-10-CM | POA: Diagnosis present

## 2022-07-20 DIAGNOSIS — E785 Hyperlipidemia, unspecified: Secondary | ICD-10-CM | POA: Diagnosis present

## 2022-07-20 DIAGNOSIS — Z808 Family history of malignant neoplasm of other organs or systems: Secondary | ICD-10-CM

## 2022-07-20 DIAGNOSIS — D696 Thrombocytopenia, unspecified: Secondary | ICD-10-CM | POA: Diagnosis present

## 2022-07-20 DIAGNOSIS — R059 Cough, unspecified: Secondary | ICD-10-CM | POA: Diagnosis not present

## 2022-07-20 DIAGNOSIS — Z87442 Personal history of urinary calculi: Secondary | ICD-10-CM

## 2022-07-20 DIAGNOSIS — Z888 Allergy status to other drugs, medicaments and biological substances status: Secondary | ICD-10-CM

## 2022-07-20 DIAGNOSIS — J9601 Acute respiratory failure with hypoxia: Secondary | ICD-10-CM | POA: Diagnosis not present

## 2022-07-20 DIAGNOSIS — Z7982 Long term (current) use of aspirin: Secondary | ICD-10-CM

## 2022-07-20 DIAGNOSIS — J849 Interstitial pulmonary disease, unspecified: Secondary | ICD-10-CM | POA: Diagnosis present

## 2022-07-20 DIAGNOSIS — L03116 Cellulitis of left lower limb: Secondary | ICD-10-CM | POA: Diagnosis present

## 2022-07-20 DIAGNOSIS — I272 Pulmonary hypertension, unspecified: Secondary | ICD-10-CM | POA: Diagnosis not present

## 2022-07-20 DIAGNOSIS — R0602 Shortness of breath: Secondary | ICD-10-CM | POA: Diagnosis not present

## 2022-07-20 DIAGNOSIS — Z7951 Long term (current) use of inhaled steroids: Secondary | ICD-10-CM

## 2022-07-20 DIAGNOSIS — Z833 Family history of diabetes mellitus: Secondary | ICD-10-CM

## 2022-07-20 DIAGNOSIS — Z8616 Personal history of COVID-19: Secondary | ICD-10-CM | POA: Diagnosis not present

## 2022-07-20 DIAGNOSIS — R918 Other nonspecific abnormal finding of lung field: Secondary | ICD-10-CM | POA: Diagnosis not present

## 2022-07-20 DIAGNOSIS — J189 Pneumonia, unspecified organism: Secondary | ICD-10-CM

## 2022-07-20 DIAGNOSIS — Z811 Family history of alcohol abuse and dependence: Secondary | ICD-10-CM

## 2022-07-20 DIAGNOSIS — Z8261 Family history of arthritis: Secondary | ICD-10-CM | POA: Diagnosis not present

## 2022-07-20 DIAGNOSIS — K449 Diaphragmatic hernia without obstruction or gangrene: Secondary | ICD-10-CM | POA: Diagnosis not present

## 2022-07-20 DIAGNOSIS — Z79899 Other long term (current) drug therapy: Secondary | ICD-10-CM

## 2022-07-20 DIAGNOSIS — F419 Anxiety disorder, unspecified: Secondary | ICD-10-CM | POA: Diagnosis present

## 2022-07-20 LAB — RESPIRATORY PANEL BY PCR

## 2022-07-20 LAB — COMPREHENSIVE METABOLIC PANEL
ALT: 19 U/L (ref 0–44)
AST: 25 U/L (ref 15–41)
Albumin: 4 g/dL (ref 3.5–5.0)
Alkaline Phosphatase: 87 U/L (ref 38–126)
Anion gap: 10 (ref 5–15)
BUN: 16 mg/dL (ref 8–23)
CO2: 26 mmol/L (ref 22–32)
Calcium: 9.5 mg/dL (ref 8.9–10.3)
Chloride: 102 mmol/L (ref 98–111)
Creatinine, Ser: 0.83 mg/dL (ref 0.61–1.24)
GFR, Estimated: >60 mL/min (ref >60)
Glucose, Bld: 116 mg/dL — ABNORMAL HIGH (ref 70–99)
Potassium: 4 mmol/L (ref 3.5–5.1)
Sodium: 138 mmol/L (ref 135–145)
Total Bilirubin: 0.8 mg/dL (ref 0.3–1.2)
Total Protein: 7 g/dL (ref 6.5–8.1)

## 2022-07-20 LAB — RESP PANEL BY RT-PCR (RSV, FLU A&B, COVID)  RVPGX2
Influenza A by PCR: NEGATIVE
Influenza B by PCR: NEGATIVE
Resp Syncytial Virus by PCR: NEGATIVE
SARS Coronavirus 2 by RT PCR: NEGATIVE

## 2022-07-20 LAB — CBC WITH DIFFERENTIAL/PLATELET
Abs Immature Granulocytes: 0 10*3/uL (ref 0.00–0.07)
Basophils Absolute: 0.2 10*3/uL — ABNORMAL HIGH (ref 0.0–0.1)
Basophils Relative: 1 %
Eosinophils Absolute: 0 10*3/uL (ref 0.0–0.5)
Eosinophils Relative: 0 %
HCT: 37 % — ABNORMAL LOW (ref 39.0–52.0)
Hemoglobin: 11.2 g/dL — ABNORMAL LOW (ref 13.0–17.0)
Lymphocytes Relative: 7 %
Lymphs Abs: 1.5 10*3/uL (ref 0.7–4.0)
MCH: 29.1 pg (ref 26.0–34.0)
MCHC: 30.3 g/dL (ref 30.0–36.0)
MCV: 96.1 fL (ref 80.0–100.0)
Monocytes Absolute: 1.7 10*3/uL — ABNORMAL HIGH (ref 0.1–1.0)
Monocytes Relative: 8 %
Neutro Abs: 17.7 10*3/uL — ABNORMAL HIGH (ref 1.7–7.7)
Neutrophils Relative %: 84 %
Platelets: 209 10*3/uL (ref 150–400)
RBC: 3.85 MIL/uL — ABNORMAL LOW (ref 4.22–5.81)
RDW: 26 % — ABNORMAL HIGH (ref 11.5–15.5)
WBC: 21.1 10*3/uL — ABNORMAL HIGH (ref 4.0–10.5)
nRBC: 0.1 % (ref 0.0–0.2)

## 2022-07-20 LAB — BRAIN NATRIURETIC PEPTIDE: B Natriuretic Peptide: 55.8 pg/mL (ref 0.0–100.0)

## 2022-07-20 LAB — TROPONIN I (HIGH SENSITIVITY)
Troponin I (High Sensitivity): 9 ng/L (ref <18)
Troponin I (High Sensitivity): 9 ng/L (ref <18)

## 2022-07-20 MED ORDER — SODIUM CHLORIDE 0.9 % IV SOLN: 500.0000 mg | INTRAVENOUS | Status: DC

## 2022-07-20 MED ORDER — MOMETASONE FURO-FORMOTEROL FUM 100-5 MCG/ACT IN AERO
2.0000 | INHALATION_SPRAY | Freq: Two times a day (BID) | RESPIRATORY_TRACT | Status: DC
  Administered 2022-07-21 – 2022-07-23 (×4): 2 via RESPIRATORY_TRACT
  Filled 2022-07-20: qty 8.8

## 2022-07-20 MED ORDER — FUROSEMIDE 10 MG/ML IJ SOLN
40.0000 mg | Freq: Once | INTRAMUSCULAR | Status: AC
  Administered 2022-07-21: 40 mg via INTRAVENOUS
  Filled 2022-07-20: qty 4

## 2022-07-20 MED ORDER — PANTOPRAZOLE SODIUM 40 MG PO TBEC
40.0000 mg | DELAYED_RELEASE_TABLET | Freq: Every day | ORAL | Status: DC
  Administered 2022-07-21: 40 mg via ORAL
  Filled 2022-07-20 (×2): qty 1

## 2022-07-20 MED ORDER — SODIUM CHLORIDE 0.9 % IV SOLN
2.0000 g | INTRAVENOUS | Status: DC
  Administered 2022-07-21 – 2022-07-23 (×3): 2 g via INTRAVENOUS
  Filled 2022-07-20 (×3): qty 20

## 2022-07-20 MED ORDER — AMLODIPINE BESYLATE 5 MG PO TABS
2.5000 mg | ORAL_TABLET | Freq: Every day | ORAL | Status: DC
  Administered 2022-07-21 – 2022-07-23 (×3): 2.5 mg via ORAL
  Filled 2022-07-20 (×3): qty 1

## 2022-07-20 MED ORDER — IPRATROPIUM-ALBUTEROL 0.5-2.5 (3) MG/3ML IN SOLN: 3.0000 mL | Freq: Once | RESPIRATORY_TRACT | Status: DC

## 2022-07-20 MED ORDER — IOHEXOL 350 MG/ML SOLN
75.0000 mL | Freq: Once | INTRAVENOUS | Status: AC | PRN
  Administered 2022-07-20: 75 mL via INTRAVENOUS

## 2022-07-20 MED ORDER — SIMVASTATIN 20 MG PO TABS
20.0000 mg | ORAL_TABLET | Freq: Every day | ORAL | Status: DC
  Administered 2022-07-21 – 2022-07-23 (×3): 20 mg via ORAL
  Filled 2022-07-20 (×3): qty 1

## 2022-07-20 MED ORDER — HYDROXYUREA 500 MG PO CAPS
500.0000 mg | ORAL_CAPSULE | Freq: Once | ORAL | Status: AC
  Administered 2022-07-20: 500 mg via ORAL
  Filled 2022-07-20: qty 1

## 2022-07-20 MED ORDER — SODIUM CHLORIDE 0.9 % IV SOLN
500.0000 mg | Freq: Once | INTRAVENOUS | Status: AC
  Administered 2022-07-20: 500 mg via INTRAVENOUS
  Filled 2022-07-20: qty 5

## 2022-07-20 MED ORDER — HYDROXYUREA 500 MG PO CAPS
500.0000 mg | ORAL_CAPSULE | Freq: Every day | ORAL | Status: DC
  Administered 2022-07-21 – 2022-07-22 (×2): 500 mg via ORAL
  Filled 2022-07-20 (×4): qty 1

## 2022-07-20 MED ORDER — ENOXAPARIN SODIUM 40 MG/0.4ML IJ SOSY
40.0000 mg | PREFILLED_SYRINGE | INTRAMUSCULAR | Status: DC
  Administered 2022-07-21 – 2022-07-23 (×3): 40 mg via SUBCUTANEOUS
  Filled 2022-07-20 (×3): qty 0.4

## 2022-07-20 MED ORDER — ASPIRIN 81 MG PO TBEC
81.0000 mg | DELAYED_RELEASE_TABLET | Freq: Every day | ORAL | Status: DC
  Administered 2022-07-21 – 2022-07-23 (×3): 81 mg via ORAL
  Filled 2022-07-20 (×3): qty 1

## 2022-07-20 MED ORDER — SODIUM CHLORIDE 0.9 % IV SOLN
1.0000 g | Freq: Once | INTRAVENOUS | Status: AC
  Administered 2022-07-20: 1 g via INTRAVENOUS
  Filled 2022-07-20: qty 10

## 2022-07-20 NOTE — Assessment & Plan Note (Addendum)
81 yo M with h/o ILD, ? of IPF with UIP (see pulm office note). In today with new O2 requirement, bibasilar ground glass opacities superimposed on ILD background on CTA chest. DDx = CAP vs AEIPF vs CHF Has leukocytosis today but looks like that is chronic, baseline, and related to his MPD. Low grade temp of 100 and mild L shift though. COVID, flu, and RSV are all negative. Clinically reports worsening peripheral edema and orthopnea, though BNP is nl. PNA pathway Cont CAP ABx for the moment Asking pulm for formal consult in AM and to weigh in on wether to start steroids / other treatments for possible AEIPF now.  Per Dr. Hunsucker: "would recommend full RVP panel and a CRP and sed rate tonight. if resp panel negative would consider high dose IV steroids - minimum 1 mg/kg but sometimes do more. " I also pointed out that it conveniently looks like Dr. Ramaswamy (pulmonologist who he sees in office) is working here at [facility] tomorrow Will give test dose of 40mg IV lasix to see how he responds RVP ordered CRP and ESR ordered 2d echo ordered 

## 2022-07-20 NOTE — ED Notes (Addendum)
Patient's oxygen saturation dropped to 87% while ambulating to restroom.  Patient denied shob.

## 2022-07-20 NOTE — ED Notes (Signed)
Patient given urinal per request.

## 2022-07-20 NOTE — ED Notes (Signed)
Patient to CT.

## 2022-07-20 NOTE — Assessment & Plan Note (Signed)
Chronic leukocytosis, and had thrombocytopenia in past.  Not thrombocytopenic today with plt count of 209. Follows with heme/onc. Continue hydrea

## 2022-07-20 NOTE — ED Notes (Signed)
Patient is being discharged from the Urgent Care and sent to the Emergency Department via [place] . Per Nyoka Lint, PA, patient is in need of higher level of care due to Shortness of Breath, leg swelling. Patient is aware and verbalizes understanding of plan of care.  Vitals:   07/20/22 1738 07/20/22 1746  BP: 136/61   Pulse: (!) 104   Resp: (!) 22   Temp: 98.7 F (37.1 C)   SpO2: (!) 88% 92%

## 2022-07-20 NOTE — Assessment & Plan Note (Signed)
Cont home BP meds ?

## 2022-07-20 NOTE — ED Triage Notes (Signed)
BIB [facility] form urgent care for shortness of breath and increased leg swelling

## 2022-07-20 NOTE — ED Notes (Signed)
[name] made aware of patient coming via [place].

## 2022-07-20 NOTE — ED Provider Notes (Signed)
[place] EMERGENCY DEPARTMENT Provider Note   CSN: [id number] Arrival date & time: 07/20/22  1853     History  Chief Complaint  Patient presents with  . Shortness of Breath    [name] is a 81 y.o. male.   Shortness of Breath   81 year old male presents emergency department complaints of shortness of breath and cough.  Patient progressively worsening shortness of breath and cough over the past 3 to 4 weeks.  Was seen urgent care earlier today and found to be hypoxic with 88% oxygen saturation on room air.  He was transferred via [facility] to the emergency department for further evaluation.  Patient has history of interstitial lung disease on no oxygen at baseline.  Patient reports cough has productive in nature.  States that shortness of breath is worsened with any physical activity as well as lying flat at night.  He is also noticed increased bilateral lower extremity leg swelling that has been intermittent over the past week but is acutely worsened today and not "calm down."  Denies any known fever, chills, chest pain, abdominal pain, nausea, vomiting, urinary symptoms, change in bowel habits.  Past medical history significant for interstitial lung disease, JAK2 positive myeloproliferative disorder with chronic leukocytosis on hydroxyurea, heart attack, CAD, hypertension, hyperlipidemia,   Home Medications Prior to Admission medications   Medication Sig Start Date End Date Taking? Authorizing Provider  acetaminophen (TYLENOL) 325 MG tablet Take 650 mg by mouth every 6 (six) hours as needed for mild pain or moderate pain.   Yes [provider]  amLODipine (NORVASC) 2.5 MG tablet Take 1 tablet (2.5 mg total) by mouth daily. 04/22/22  Yes [name], MD  Apoaequorin (PREVAGEN PO) Take 1 tablet by mouth daily.    Yes [provider]  aspirin 81 MG EC tablet Take 81 mg by mouth daily.   Yes [provider]  budesonide-formoterol  (SYMBICORT) 80-4.5 MCG/ACT inhaler Inhale 2 puffs into the lungs 2 (two) times daily. Patient taking differently: Inhale 2 puffs into the lungs as needed. 03/11/22  Yes [name], MD  Cholecalciferol (VITAMIN D3) 1.25 MG (50000 UT) CAPS Take 1 capsule by mouth daily. Pt does not know dose.   Yes [provider]  hydroxyurea (HYDREA) 500 MG capsule Take 1 capsule (500 mg total) by mouth daily. May take with food to minimize GI side effects. Patient taking differently: Take 500 mg by mouth daily. May take with food to minimize GI side effects.  (At 7:00pm) 06/03/22  Yes Curt Bears, MD  Multiple Vitamins-Minerals (ICAPS AREDS 2) CAPS Take by mouth daily.   Yes [provider]  pantoprazole (PROTONIX) 40 MG tablet TAKE 1 TABLET 1 TIME DAILY 30 TO 60 MINUTES BEFORE FIRST MEAL OF THE DAY Patient taking differently: See admin instructions. TAKE 1 TABLET 1 TIME DAILY 30 TO 60 MINUTES BEFORE FIRST MEAL OF THE DAY 11/23/21  Yes [name], MD  simvastatin (ZOCOR) 20 MG tablet TAKE 1 TABLET EVERY DAY WITH BREAKFAST (APPOINTMENT IS NEEDED FOR REFILLS) Patient taking differently: Take 20 mg by mouth daily. WITH BREAKFAST (APPOINTMENT IS NEEDED FOR REFILLS) 12/19/21  Yes [name], MD  valACYclovir (VALTREX) 500 MG tablet Take 1 tablet (500 mg total) by mouth 2 (two) times daily. Take at 1st sign of penile rash. 05/27/22  Yes [name], MD  prochlorperazine (COMPAZINE) 10 MG tablet Take 1 tablet (10 mg total) by mouth every 6 (six) hours as needed  for nausea or vomiting. Patient not taking: Reported on 07/20/2022 06/03/22   Curt Bears, MD  UNABLE TO FIND Med Name: CPAP per Dr Halford Chessman    [provider]      Allergies    Celebrex [celecoxib] and Tramadol    Review of Systems   Review of Systems  Respiratory:  Positive for shortness of breath.   All other systems reviewed and are negative.   Physical Exam Updated Vital Signs BP (!) 135/55   Pulse  93   Temp 100 F (37.8 C) (Oral)   Resp (!) 26   Ht '5\' 7"'$  (1.702 m)   Wt 73.8 kg   SpO2 95%   BMI 25.48 kg/m  Physical Exam Vitals and nursing note reviewed.  Constitutional:      General: He is not in acute distress.    Appearance: He is well-developed.  HENT:     Head: Normocephalic and atraumatic.  Eyes:     Conjunctiva/sclera: Conjunctivae normal.  Cardiovascular:     Rate and Rhythm: Normal rate and regular rhythm.     Heart sounds: No murmur heard. Pulmonary:     Effort: Pulmonary effort is normal. No respiratory distress.     Breath sounds: Normal breath sounds.     Comments: Rales auscultated bilateral lower lung [place].  Mild rhonchi auscultated mid lung field. Chest:     Chest wall: No tenderness.  Abdominal:     Palpations: Abdomen is soft.     Tenderness: There is no abdominal tenderness.  Musculoskeletal:        General: No swelling.     Cervical back: Neck supple.     Comments: 2+ pitting edema bilateral lower extremities.  Skin:    General: Skin is warm and dry.     Capillary Refill: Capillary refill takes less than 2 seconds.  Neurological:     Mental Status: He is alert.  Psychiatric:        Mood and Affect: Mood normal.     ED Results / Procedures / Treatments   Labs (all labs ordered are listed, but only abnormal results are displayed) Labs Reviewed  COMPREHENSIVE METABOLIC PANEL - Abnormal; Notable for the following components:      Result Value   Glucose, Bld 116 (*)    All other components within normal limits  CBC WITH DIFFERENTIAL/PLATELET - Abnormal; Notable for the following components:   WBC 21.1 (*)    RBC 3.85 (*)    Hemoglobin 11.2 (*)    HCT 37.0 (*)    RDW 26.0 (*)    Neutro Abs 17.7 (*)    Monocytes Absolute 1.7 (*)    Basophils Absolute 0.2 (*)    All other components within normal limits  RESP PANEL BY RT-PCR (RSV, FLU A&B, COVID)  RVPGX2  RESPIRATORY PANEL BY PCR  BRAIN NATRIURETIC PEPTIDE  SEDIMENTATION RATE   C-REACTIVE PROTEIN  TROPONIN I (HIGH SENSITIVITY)  TROPONIN I (HIGH SENSITIVITY)    EKG None  Radiology CT Angio Chest PE W and/or Wo Contrast  Result Date: 07/20/2022 CLINICAL DATA:  Patient having dry cough and shortness of breath for 3-4 days. Patient at swelling in the legs and feet in past couple of days. EXAM: CT ANGIOGRAPHY CHEST WITH CONTRAST TECHNIQUE: Multidetector CT imaging of the chest was performed using the standard protocol during bolus administration of intravenous contrast. Multiplanar CT image reconstructions and MIPs were obtained to evaluate the vascular anatomy. RADIATION DOSE REDUCTION: This exam was performed according  to the departmental dose-optimization program which includes automated exposure control, adjustment of the mA and/or kV according to patient size and/or use of iterative reconstruction technique. CONTRAST:  52m OMNIPAQUE IOHEXOL 350 MG/ML SOLN COMPARISON:  CT examination dated May 04, 2022 FINDINGS: Cardiovascular: Satisfactory opacification of the pulmonary arteries to the segmental level. No evidence of pulmonary embolism. Normal heart size. No pericardial effusion. Prominent coronary artery atherosclerotic calcifications. Main pulmonary trunk is dilated measuring up to 3.2 cm concerning for pulmonary arterial hypertension. Mediastinum/Nodes: No enlarged mediastinal, hilar, or axillary lymph nodes. Thyroid gland, trachea, and esophagus demonstrate no significant findings. Lungs/Pleura: Prominence of the central pulmonary vessels as well as mild bilateral perihilar and lower lobe ground-glass opacities. Findings may be secondary to infectious/inflammatory process or mild pulmonary edema in the background of chronic interstitial lung disease with mild bronchiectasis and scarring. No large pleural effusion. Chronic elevation of the right hemidiaphragm, unchanged. Upper Abdomen: No acute abnormality. Musculoskeletal: Advanced multilevel degenerate disc disease  of the thoracic spine with flowing bulky osteophytes. Review of the MIP images confirms the above findings. IMPRESSION: 1. No evidence of pulmonary embolism. 2. Prominence of the central pulmonary vessels as well as mild bilateral perihilar and lower lobe ground-glass opacities. Findings may be secondary to mild pulmonary edema or infectious/inflammatory process in the background of chronic interstitial lung disease. No large pleural effusion. 3. Prominent coronary artery atherosclerotic calcifications. 4. Main pulmonary trunk is dilated measuring up to 3.2 cm concerning for pulmonary arterial hypertension. Electronically Signed   By: IKeane PoliceD.O.   On: 07/20/2022 22:09   [name] 2 View  Result Date: 07/20/2022 CLINICAL DATA:  Cough, COPD EXAM: CHEST - 2 VIEW COMPARISON:  03/11/2022 FINDINGS: Frontal and lateral views of the chest demonstrate a stable cardiac silhouette. Chronic elevation of the right hemidiaphragm. Stable bibasilar fibrosis and bronchiectasis. No acute airspace disease, effusion, or pneumothorax. IMPRESSION: 1. Stable bibasilar fibrosis and bronchiectasis. 2. No acute airspace disease. 3. Chronic elevation right hemidiaphragm. Electronically Signed   By: MRanda NgoM.D.   On: 07/20/2022 17:35    Procedures .Critical Care  Performed by: RWilnette Kales PA Authorized by: RWilnette Kales PA   Critical care provider statement:    Critical care time (minutes):  45   Critical care was necessary to treat or prevent imminent or life-threatening deterioration of the following conditions:  Respiratory failure   Critical care was time spent personally by me on the following activities:  Development of treatment plan with patient or surrogate, discussions with consultants, evaluation of patient's response to treatment, examination of patient, ordering and review of laboratory studies, ordering and review of radiographic studies, ordering and performing treatments and  interventions, pulse oximetry, re-evaluation of patient's condition and review of old charts   I assumed direction of critical care for this patient from another provider in my specialty: no     Care discussed with: admitting provider       Medications Ordered in ED Medications  ipratropium-albuterol (DUONEB) 0.5-2.5 (3) MG/3ML nebulizer solution 3 mL (0 mLs Nebulization Hold 07/20/22 2234)  cefTRIAXone (ROCEPHIN) 1 g in sodium chloride 0.9 % 100 mL IVPB (1 g Intravenous New Bag/Given 07/20/22 2233)  azithromycin (ZITHROMAX) 500 mg in sodium chloride 0.9 % 250 mL IVPB (has no administration in time range)  mometasone-formoterol (DULERA) 100-5 MCG/ACT inhaler 2 puff (has no administration in time range)  hydroxyurea (HYDREA) capsule 500 mg (has no administration in time range)  amLODipine (NORVASC) tablet 2.5 mg (has  no administration in time range)  aspirin EC tablet 81 mg (has no administration in time range)  pantoprazole (PROTONIX) EC tablet 40 mg (has no administration in time range)  simvastatin (ZOCOR) tablet 20 mg (has no administration in time range)  hydroxyurea (HYDREA) capsule 500 mg (500 mg Oral Given 07/20/22 2035)  iohexol (OMNIPAQUE) 350 MG/ML injection 75 mL (75 mLs Intravenous Contrast Given 07/20/22 2154)    ED Course/ Medical Decision Making/ A&P Clinical Course as of 07/20/22 2300  Sat Jul 20, 2022  2048 Patient ambulated by nursing staff with decrease in oxygen saturation to 87% on room air. [CR]  2239 [facility] hospital medicine Dr. Alcario Drought regarding the patient who agreed with admission and assume further treatment/care. [CR]    Clinical Course User Index [CR] Wilnette Kales, PA                           Medical Decision Making Amount and/or Complexity of Data Reviewed Labs: ordered. Radiology: ordered.  Risk Prescription drug management. Decision regarding hospitalization.   This patient presents to the ED for concern of shortness of breath, this  involves an extensive number of treatment options, and is a complaint that carries with it a high risk of complications and morbidity.  The differential diagnosis includes The causes for shortness of breath include but are not limited to Cardiac (AHF, pericardial effusion and tamponade, arrhythmias, ischemia, etc) Respiratory (COPD, asthma, pneumonia, pneumothorax, primary pulmonary hypertension, PE/VQ mismatch) Hematological (anemia)  Co morbidities that complicate the patient evaluation  See HPI   Additional history obtained:  Additional history obtained from EMR External records from outside source obtained and reviewed including hospital records   Lab Tests:  I Ordered, and personally interpreted labs.  The pertinent results include: Leukocytosis of 21.1 which is near patient's baseline leukocytosis from myeloproliferative disorder.  Anemia with hemoglobin 11.2 which is near patient's baseline anemia from prior studies.  Platelets within normal range.  Respiratory viral panel negative.  Electrolytes within normal range.  No transaminitis noted.  Renal function within normal limits.  Initial troponin of 9 with repeat 9; EKG concerning for sinus rhythm without acute ischemic changes.   Imaging Studies ordered:  I ordered imaging studies including CT angio chest PE I independently visualized and interpreted imaging which showed no evidence of pulmonary embolism.  Prominence of central pulmonary vessels as well as symptoms.  No large pleural effusion.  Prominent coronary artery atherosclerosis.  Main pulmonary trunk dilated to 3.2 cm concerning for pulmonary arterial hypertension. I agree with the radiologist interpretation  Cardiac Monitoring: / EKG:  The patient was maintained on a cardiac monitor.  I personally viewed and interpreted the cardiac monitored which showed an underlying rhythm of: Sinus rhythm   Consultations Obtained:  See ED course  Problem List / ED Course /  Critical interventions / Medication management  Hypoxia/community-acquired pneumonia I ordered medication including azithromycin and Rocephin for commune acquired pneumonia coverage  Reevaluation of the patient after these medicines showed that the patient improved I have reviewed the patients home medicines and have made adjustments as needed   Social Determinants of Health:  Denies tobacco, illicit drug use.   Test / Admission - Considered:  Hypoxia/community-acquired pneumonia Vitals signs significant for persistent tachypnea with respiratory rate greater than 23-24.  Patient also with e hypertension with blood pressure of 135/55. Otherwise within normal range and stable throughout visit. Laboratory/imaging studies significant for: See above Given patient's new  onset hypoxia and evidence of possible community-acquired pneumonia, patient deemed to meet admission criteria.  Symptoms could be secondary to acute on chronic interstitial lung disease as indicated on CT reading.  Patient given broad-spectrum antibiotics for community-acquired pneumonia coverage. Treatment plan were discussed at length with patient and they knowledge understanding was agreeable to said plan.  Appropriate consultations were made as described in the ED course.  Patient was stable upon admission to the hospital.         Final Clinical Impression(s) / ED Diagnoses Final diagnoses:  Hypoxia  Community acquired pneumonia, unspecified laterality    Rx / DC Orders ED Discharge Orders     None         Wilnette Kales, [place] 07/20/22 2255    Davonna Belling, MD 07/20/22 2311

## 2022-07-20 NOTE — Assessment & Plan Note (Signed)
Question of [facility] today: Cont chronic home inhalers Discussing with pulm as above.

## 2022-07-20 NOTE — ED Triage Notes (Signed)
Pt having dry cough and SOB 3-4 days. Pt adds swelling in legs and feet in past couple days that is painful. Has lung disease dx.  Humidifier with Vicks, and warm tea with honey.

## 2022-07-20 NOTE — ED Provider Notes (Signed)
[place]    CSN: [id number] Arrival date & time: 07/20/22  1707      History   Chief Complaint Chief Complaint  Patient presents with   Cough   Shortness of Breath    HPI [name] is a 81 y.o. male.   81 year old male presents with shortness of breath and cough.  Patient indicates for the past 3 to 4 days he has been having increasing difficulty breathing with shortness of breath and recurrent cough.  He indicates having some intermittent wheezing associated.  He indicates that he has pulmonary fibrosis and several months ago had a COVID infection.  Patient indicates that his symptoms he has been noticing that his legs are starting to swell more and are tender.  Patient indicates shortness of breath is worse when he tries to lie down. The patient's pulse ox is 88% on room air in the exam room.  He is placed on O2 to 3 L a minute which brings his pulse ox up to 93 to 94%.  Patient has been advised that due to this increasing respiratory compromise that he should be transported to the emergency room for higher level care and evaluation.   Cough Associated symptoms: shortness of breath   Shortness of Breath Associated symptoms: cough     Past Medical History:  Diagnosis Date   Abnormal chest x-ray    Allergic rhinitis    Allergy    Ankylosing spondylitis (HCC)    Anxiety    Arthritis    Colonic polyp    Coronary atherosclerosis of native coronary artery    Cough    Dizziness 10/21/2016   Ganglion cyst of wrist    left   Heart attack ([place]) 2008   Hemorrhoids    History of malaria    Hyperlipidemia    with low HDL   Hypertension    Kidney stone    Other disorder of muscle, ligament, and fascia    Rotator cuff tear    Swelling of lower extremity 10/16/2015    Patient Active Problem List   Diagnosis Date Noted   Leukocytosis 06/03/2022   Hypertrophy of prostate without urinary obstruction and other lower urinary tract symptoms (LUTS) 12/04/2021    Prostate cancer ([place]) 05/29/2021   GERD (gastroesophageal reflux disease) 12/15/2017   Unstable angina ([place]) 12/05/2017   Angina at rest 12/05/2017   Atypical chest pain    Dyslipidemia    COPD not affecting current episode of care    Essential hypertension 05/29/2017   Abnormal nuclear cardiac imaging test 01/14/2017   Bronchiectasis without complication ([place]) 66/29/4765   ILD (interstitial lung disease) ([place]) 11/27/2016   Dizziness 10/21/2016   Cough variant asthma  vs UACS 07/22/2016   Obstructive sleep apnea 12/01/2015   Acute bronchitis 11/30/2015   Swelling of lower extremity 10/16/2015   Benign mole 02/04/2014   Impaired glucose tolerance 10/26/2013   NAFLD (nonalcoholic fatty liver disease) 04/27/2013   Thrombocytopenia ([place]) 10/21/2012   Exertional angina 09/03/2012   MALARIA 12/05/2009   COLONIC POLYPS 12/05/2009   HYPERCHOLESTEROLEMIA 12/05/2009   ANXIETY 12/05/2009   HEMORRHOIDS 12/05/2009   Allergic rhinitis 12/05/2009   GANGLION CYST, WRIST, LEFT 12/05/2009   ROTATOR CUFF TEAR 12/05/2009   OTHER DISORDER OF MUSCLE LIGAMENT AND FASCIA 12/05/2009   ABNORMAL CHEST XRAY 12/05/2009   CAD (coronary artery disease) 04/05/2009    Past Surgical History:  Procedure Laterality Date   bicep rupture     CORONARY ANGIOPLASTY WITH STENT  PLACEMENT     HEMORRHOID SURGERY  05/2009   in [facility]   LEFT HEART CATH AND CORONARY ANGIOGRAPHY N/A 01/14/2017   Procedure: Left Heart Cath and Coronary Angiography;  Surgeon: [name], MD;  Location: [facility];  Service: Cardiovascular;  Laterality: N/A;   LEFT HEART CATHETERIZATION WITH CORONARY ANGIOGRAM N/A 09/03/2012   Procedure: LEFT HEART CATHETERIZATION WITH CORONARY ANGIOGRAM;  Surgeon: [name], MD;  Location: [facility] CATH LAB;  Service: Cardiovascular;  Laterality: N/A;   PROSTATE BIOPSY     ROTATOR CUFF REPAIR     bilat by Dr. French Ana       Home Medications    Prior to Admission medications   Medication  Sig Start Date End Date Taking? Authorizing Provider  amLODipine (NORVASC) 2.5 MG tablet Take 1 tablet (2.5 mg total) by mouth daily. 04/22/22   [name], MD  Apoaequorin (PREVAGEN PO) Take 1 tablet by mouth daily.     [provider]  aspirin 81 MG EC tablet Take 81 mg by mouth daily.    [provider]  budesonide-formoterol (SYMBICORT) 80-4.5 MCG/ACT inhaler Inhale 2 puffs into the lungs 2 (two) times daily. Patient taking differently: Inhale 2 puffs into the lungs as needed. 03/11/22   [name], MD  Cholecalciferol (VITAMIN D3) 1.25 MG (50000 UT) CAPS Take 1 capsule by mouth daily. Pt does not know dose.    [provider]  famotidine (PEPCID) 20 MG tablet Take by mouth.    [provider]  hydroxyurea (HYDREA) 500 MG capsule Take 1 capsule (500 mg total) by mouth daily. May take with food to minimize GI side effects. 06/03/22   Curt Bears, MD  Multiple Vitamins-Minerals (ICAPS AREDS 2) CAPS Take by mouth daily.    [provider]  pantoprazole (PROTONIX) 40 MG tablet TAKE 1 TABLET 1 TIME DAILY 30 TO 60 MINUTES BEFORE FIRST MEAL OF THE DAY 11/23/21   [name], MD  prochlorperazine (COMPAZINE) 10 MG tablet Take 1 tablet (10 mg total) by mouth every 6 (six) hours as needed for nausea or vomiting. Patient not taking: Reported on 06/25/2022 06/03/22   Curt Bears, MD  simvastatin (ZOCOR) 20 MG tablet TAKE 1 TABLET EVERY DAY WITH BREAKFAST (APPOINTMENT IS NEEDED FOR REFILLS) 12/19/21   [name], MD  UNABLE TO FIND Med Name: CPAP per Dr Halford Chessman    [provider]  valACYclovir (VALTREX) 500 MG tablet Take 1 tablet (500 mg total) by mouth 2 (two) times daily. Take at 1st sign of penile rash. 05/27/22   [name], MD    Family History Family History  Problem Relation Age of Onset   Cancer Mother        melanoma   Emphysema Mother    Rheum arthritis Mother    Diabetes Mother    Alcohol abuse Father      Social History Social History   Tobacco Use   Smoking status: Never    Passive exposure: Never   Smokeless tobacco: Never  Vaping Use   Vaping Use: Never used  Substance Use Topics   Alcohol use: Yes    Comment: holiday Rare    Drug use: No     Allergies   Celebrex [celecoxib] and Tramadol   Review of Systems Review of Systems  Respiratory:  Positive for cough and shortness of breath.      Physical Exam Triage Vital Signs ED Triage Vitals  Enc Vitals Group  BP 07/20/22 1738 136/61     Pulse Rate 07/20/22 1738 (!) 104     Resp 07/20/22 1738 (!) 22     Temp 07/20/22 1738 98.7 F (37.1 C)     Temp Source 07/20/22 1738 Oral     SpO2 07/20/22 1738 (!) 88 %     Weight --      Height --      Head Circumference --      Peak Flow --      Pain Score 07/20/22 1737 7     Pain Loc --      Pain Edu? --      Excl. in [place]? --    No data found.  Updated Vital Signs BP 136/61 (BP Location: Left Arm)   Pulse (!) 104   Temp 98.7 F (37.1 C) (Oral)   Resp (!) 22   SpO2 92%   Visual Acuity Right Eye Distance:   Left Eye Distance:   Bilateral Distance:    Right Eye Near:   Left Eye Near:    Bilateral Near:     Physical Exam Cardiovascular:     Rate and Rhythm: Normal rate and regular rhythm.     Heart sounds: Normal heart sounds.  Pulmonary:     Effort: Accessory muscle usage present.     Breath sounds: Decreased air movement present. Examination of the right-upper field reveals rhonchi and rales. Examination of the left-upper field reveals rhonchi and rales. Rhonchi (moderate) and rales (moderate) present.  Neurological:     Mental Status: He is alert.      UC Treatments / Results  Labs (all labs ordered are listed, but only abnormal results are displayed) Labs Reviewed - No data to display  EKG   Radiology [name] 2 View  Result Date: 07/20/2022 CLINICAL DATA:  Cough, COPD EXAM: CHEST - 2 VIEW COMPARISON:  03/11/2022 FINDINGS: Frontal and  lateral views of the chest demonstrate a stable cardiac silhouette. Chronic elevation of the right hemidiaphragm. Stable bibasilar fibrosis and bronchiectasis. No acute airspace disease, effusion, or pneumothorax. IMPRESSION: 1. Stable bibasilar fibrosis and bronchiectasis. 2. No acute airspace disease. 3. Chronic elevation right hemidiaphragm. Electronically Signed   By: Randa Ngo M.D.   On: 07/20/2022 17:35    Procedures Procedures (including critical care time)  Medications Ordered in UC Medications - No data to display  Initial Impression / Assessment and Plan / UC Course  I have reviewed the triage vital signs and the nursing notes.  Pertinent labs & imaging results that were available during my care of the patient were reviewed by me and considered in my medical decision making (see chart for details).    Plan: 1.  Shortness of breath and be treated with the following: A.  Patient will be transported to the emergency room via [facility] for higher level of care. 2.  Hypoxia.  Following: A.  Patient's bloody stools to bring pulse ox up to 93/94%. B.  Patient be transported to emergency room via [facility] for higher level of care. 3.  Patient is to follow-up with PCP. Final Clinical Impressions(s) / UC Diagnoses   Final diagnoses:  Shortness of breath  Hypoxia   Discharge Instructions   None    ED Prescriptions   None    PDMP not reviewed this encounter.   Nyoka Lint, PA-C 07/20/22 1849

## 2022-07-20 NOTE — ED Notes (Signed)
[facility] notified of transport needed to ED.

## 2022-07-20 NOTE — H&P (Signed)
History and Physical    Patient: [name] PVV:[id number] DOB: 01/29/1941 DOA: 07/20/2022 DOS: the patient was seen and examined on 07/20/2022 PCP: [name], MD  Patient coming from: Home  Chief Complaint:  Chief Complaint  Patient presents with   Shortness of Breath   HPI: [name] is a 81 y.o. male with medical history significant of MPD with JAK mutation on Hydrea, ILD with question / suspicion of IPF / UIP (See [name] office note from last month for details), HLD, CAD s/p stent.  Pt sent in to ED by PCP today for SOB + cough.  For the past 3 to 4 days he has been having increasing difficulty breathing with shortness of breath and recurrent cough. He indicates having some intermittent wheezing associated.  Had COVID several months ago.  Patient indicates that his symptoms he has been noticing that his legs are starting to swell more and are tender. Patient indicates shortness of breath is worse when he tries to lie down.  Pt found to have new O2 requirement (satting 88% on RA) at PCPs office today, improved to low 90s on 3L.  Sent in to ED.  Review of Systems: As mentioned in the history of present illness. All other systems reviewed and are negative. Past Medical History:  Diagnosis Date   Abnormal chest x-ray    Allergic rhinitis    Allergy    Ankylosing spondylitis (HCC)    Anxiety    Arthritis    Colonic polyp    Coronary atherosclerosis of native coronary artery    Cough    Dizziness 10/21/2016   Ganglion cyst of wrist    left   Heart attack ([place]) 2008   Hemorrhoids    History of malaria    Hyperlipidemia    with low HDL   Hypertension    Kidney stone    Other disorder of muscle, ligament, and fascia    Rotator cuff tear    Swelling of lower extremity 10/16/2015   Past Surgical History:  Procedure Laterality Date   bicep rupture     CORONARY ANGIOPLASTY WITH STENT PLACEMENT     HEMORRHOID SURGERY  05/2009   in [place]   LEFT HEART  CATH AND CORONARY ANGIOGRAPHY N/A 01/14/2017   Procedure: Left Heart Cath and Coronary Angiography;  Surgeon: [name], MD;  Location: [facility];  Service: Cardiovascular;  Laterality: N/A;   LEFT HEART CATHETERIZATION WITH CORONARY ANGIOGRAM N/A 09/03/2012   Procedure: LEFT HEART CATHETERIZATION WITH CORONARY ANGIOGRAM;  Surgeon: [name], MD;  Location: [facility] CATH LAB;  Service: Cardiovascular;  Laterality: N/A;   PROSTATE BIOPSY     ROTATOR CUFF REPAIR     bilat by Dr. French Ana   Social History:  reports that he has never smoked. He has never been exposed to tobacco smoke. He has never used smokeless tobacco. He reports current alcohol use. He reports that he does not use drugs.  Allergies  Allergen Reactions   Celebrex [Celecoxib]     Pt states precipitated his heart attack.   Tramadol Other (See Comments)    Makes patient jumpy    Family History  Problem Relation Age of Onset   Cancer Mother        melanoma   Emphysema Mother    Rheum arthritis Mother    Diabetes Mother    Alcohol abuse Father     Prior to Admission medications   Medication Sig Start Date End Date  Taking? Authorizing Provider  acetaminophen (TYLENOL) 325 MG tablet Take 650 mg by mouth every 6 (six) hours as needed for mild pain or moderate pain.   Yes [provider]  amLODipine (NORVASC) 2.5 MG tablet Take 1 tablet (2.5 mg total) by mouth daily. 04/22/22  Yes [name], MD  Apoaequorin (PREVAGEN PO) Take 1 tablet by mouth daily.    Yes [provider]  aspirin 81 MG EC tablet Take 81 mg by mouth daily.   Yes [provider]  budesonide-formoterol (SYMBICORT) 80-4.5 MCG/ACT inhaler Inhale 2 puffs into the lungs 2 (two) times daily. Patient taking differently: Inhale 2 puffs into the lungs as needed. 03/11/22  Yes [name], MD  Cholecalciferol (VITAMIN D3) 1.25 MG (50000 UT) CAPS Take 1 capsule by mouth daily. Pt does not know dose.   Yes [provider]  hydroxyurea (HYDREA) 500 MG capsule Take 1 capsule (500 mg total) by mouth daily. May take with food to minimize GI side effects. Patient taking differently: Take 500 mg by mouth daily. May take with food to minimize GI side effects.  (At 7:00pm) 06/03/22  Yes Curt Bears, MD  Multiple Vitamins-Minerals (ICAPS AREDS 2) CAPS Take by mouth daily.   Yes [provider]  pantoprazole (PROTONIX) 40 MG tablet TAKE 1 TABLET 1 TIME DAILY 30 TO 60 MINUTES BEFORE FIRST MEAL OF THE DAY Patient taking differently: See admin instructions. TAKE 1 TABLET 1 TIME DAILY 30 TO 60 MINUTES BEFORE FIRST MEAL OF THE DAY 11/23/21  Yes [name], MD  simvastatin (ZOCOR) 20 MG tablet TAKE 1 TABLET EVERY DAY WITH BREAKFAST (APPOINTMENT IS NEEDED FOR REFILLS) Patient taking differently: Take 20 mg by mouth daily. WITH BREAKFAST (APPOINTMENT IS NEEDED FOR REFILLS) 12/19/21  Yes [name], MD  valACYclovir (VALTREX) 500 MG tablet Take 1 tablet (500 mg total) by mouth 2 (two) times daily. Take at 1st sign of penile rash. 05/27/22  Yes [name], MD  prochlorperazine (COMPAZINE) 10 MG tablet Take 1 tablet (10 mg total) by mouth every 6 (six) hours as needed for nausea or vomiting. Patient not taking: Reported on 07/20/2022 06/03/22   Curt Bears, MD  UNABLE TO [place] Name: CPAP per Dr Halford Chessman    [provider]    Physical Exam: Vitals:   07/20/22 2100 07/20/22 2115 07/20/22 2130 07/20/22 2230  BP: (!) 139/49 (!) 126/53 (!) 129/53 (!) 135/55  Pulse: 89 88 86 93  Resp: 20 (!) 24 (!) 25 (!) 26  Temp:      TempSrc:      SpO2: 98% 98% 98% 95%  Weight:      Height:       Constitutional: NAD, calm, comfortable Eyes: PERRL, lids and conjunctivae normal ENMT: Mucous membranes are moist. Posterior pharynx clear of any exudate or lesions.Normal dentition.  Neck: normal, supple, no masses, no thyromegaly Respiratory: Rhonchi, rales, and decreased air  movement. Cardiovascular: Regular rate and rhythm, no murmurs / rubs / gallops. No extremity edema. 2+ pedal pulses. No carotid bruits.  Abdomen: no tenderness, no masses palpated. No hepatosplenomegaly. Bowel sounds positive.  Musculoskeletal: no clubbing / cyanosis. No joint deformity upper and lower extremities. Good ROM, no contractures. Normal muscle tone.  Skin: no rashes, lesions, ulcers. No induration Neurologic: CN 2-12 grossly intact. Sensation intact, DTR normal. Strength 5/5 in all 4.  Psychiatric: Normal judgment and insight. Alert and oriented x 3. Normal mood.   Data Reviewed:  Latest Ref Rng & Units 07/20/2022    7:03 PM 07/17/2022    7:59 AM 06/03/2022    8:21 AM  CBC  WBC 4.0 - 10.5 K/uL 21.1  17.8  28.8   Hemoglobin 13.0 - 17.0 g/dL 11.2  11.9  11.5   Hematocrit 39.0 - 52.0 % 37.0  38.2  36.8   Platelets 150 - 400 K/uL 209  201  269       Latest Ref Rng & Units 07/20/2022    7:03 PM 07/17/2022    7:59 AM 06/03/2022    8:21 AM  CMP  Glucose 70 - 99 mg/dL 116  108  105   BUN 8 - 23 mg/dL _0 Creatinine 0.61 - 1.24 mg/dL 0.83  0.78  0.89   Sodium 135 - 145 mmol/L 138  140  141   Potassium 3.5 - 5.1 mmol/L 4.0  4.1  3.9   Chloride 98 - 111 mmol/L 102  103  106   CO2 22 - 32 mmol/L _1 Calcium 8.9 - 10.3 mg/dL 9.5  10.0  9.4   Total Protein 6.5 - 8.1 g/dL 7.0  7.0  7.2   Total Bilirubin 0.3 - 1.2 mg/dL 0.8  0.7  0.8   Alkaline Phos 38 - 126 U/L 87  94  89   AST 15 - 41 U/L _2 ALT 0 - 44 U/L _3 BNP 55.8  Trop 9-> 9  Covid, flu, and RSV are neg  CTA chest: IMPRESSION: 1. No evidence of pulmonary embolism. 2. Prominence of the central pulmonary vessels as well as mild bilateral perihilar and lower lobe ground-glass opacities. Findings may be secondary to mild pulmonary edema or infectious/inflammatory process in the background of chronic interstitial lung disease. No large pleural effusion. 3. Prominent coronary  artery atherosclerotic calcifications. 4. Main pulmonary trunk is dilated measuring up to 3.2 cm concerning for pulmonary arterial hypertension.    Assessment and Plan: * Acute respiratory failure with hypoxia ([place]) 81 yo M with h/o ILD, ? of IPF with UIP (see pulm office note). In today with new O2 requirement, bibasilar ground glass opacities superimposed on ILD background on CTA chest. DDx = CAP vs AEIPF vs CHF Has leukocytosis today but looks like that is chronic, baseline, and related to his MPD. Low grade temp of 100 and mild L shift though. COVID, flu, and RSV are all negative. Clinically reports worsening peripheral edema and orthopnea, though BNP is nl. PNA pathway Cont CAP ABx for the moment Asking pulm for formal consult in AM and to weigh in on wether to start steroids / other treatments for possible AEIPF now.  Per Dr. Silas Flood: "would recommend full RVP panel and a CRP and sed rate tonight. if resp panel negative would consider high dose IV steroids - minimum 1 mg/kg but sometimes do more. " I also pointed out that it conveniently looks like [name] (pulmonologist who he sees in office) is working here at [facility] tomorrow Will give test dose of 27m IV lasix to see how he responds RVP ordered CRP and ESR ordered 2d echo ordered  ILD (interstitial lung disease) ([place] Question of AEIPF today: Cont chronic home inhalers Discussing with pulm as above.  Myeloproliferative disorder (HCC) Chronic leukocytosis, and had thrombocytopenia in past.  Not thrombocytopenic today with plt count of 209. Follows with heme/onc. Continue  hydrea  Essential hypertension Cont home BP meds      Advance Care Planning:   Code Status: Full Code  Consults: Dr. Silas Flood with PCCM  Family Communication: Family at bedside  Severity of Illness: The appropriate patient status for this patient is OBSERVATION. Observation status is judged to be reasonable and necessary in order to provide  the required intensity of service to ensure the patient's safety. The patient's presenting symptoms, physical exam findings, and initial radiographic and laboratory data in the context of their medical condition is felt to place them at decreased risk for further clinical deterioration. Furthermore, it is anticipated that the patient will be medically stable for discharge from the hospital within 2 midnights of admission.   Author: Etta Quill., DO 07/20/2022 10:58 PM  For on call review [email or web address].

## 2022-07-21 ENCOUNTER — Other Ambulatory Visit: Payer: Self-pay

## 2022-07-21 ENCOUNTER — Observation Stay (HOSPITAL_COMMUNITY): Payer: Medicare HMO

## 2022-07-21 ENCOUNTER — Encounter (HOSPITAL_COMMUNITY): Payer: Self-pay | Admitting: Internal Medicine

## 2022-07-21 DIAGNOSIS — Z1152 Encounter for screening for COVID-19: Secondary | ICD-10-CM | POA: Diagnosis not present

## 2022-07-21 DIAGNOSIS — Z955 Presence of coronary angioplasty implant and graft: Secondary | ICD-10-CM | POA: Diagnosis not present

## 2022-07-21 DIAGNOSIS — R918 Other nonspecific abnormal finding of lung field: Secondary | ICD-10-CM | POA: Diagnosis not present

## 2022-07-21 DIAGNOSIS — Z833 Family history of diabetes mellitus: Secondary | ICD-10-CM | POA: Diagnosis not present

## 2022-07-21 DIAGNOSIS — K449 Diaphragmatic hernia without obstruction or gangrene: Secondary | ICD-10-CM | POA: Diagnosis not present

## 2022-07-21 DIAGNOSIS — Z8261 Family history of arthritis: Secondary | ICD-10-CM | POA: Diagnosis not present

## 2022-07-21 DIAGNOSIS — J849 Interstitial pulmonary disease, unspecified: Secondary | ICD-10-CM | POA: Diagnosis not present

## 2022-07-21 DIAGNOSIS — J9601 Acute respiratory failure with hypoxia: Secondary | ICD-10-CM | POA: Diagnosis present

## 2022-07-21 DIAGNOSIS — Z8613 Personal history of malaria: Secondary | ICD-10-CM | POA: Diagnosis not present

## 2022-07-21 DIAGNOSIS — L03116 Cellulitis of left lower limb: Secondary | ICD-10-CM | POA: Diagnosis present

## 2022-07-21 DIAGNOSIS — I252 Old myocardial infarction: Secondary | ICD-10-CM | POA: Diagnosis not present

## 2022-07-21 DIAGNOSIS — Z8616 Personal history of COVID-19: Secondary | ICD-10-CM | POA: Diagnosis not present

## 2022-07-21 DIAGNOSIS — M7989 Other specified soft tissue disorders: Secondary | ICD-10-CM

## 2022-07-21 DIAGNOSIS — R52 Pain, unspecified: Secondary | ICD-10-CM | POA: Diagnosis not present

## 2022-07-21 DIAGNOSIS — I2510 Atherosclerotic heart disease of native coronary artery without angina pectoris: Secondary | ICD-10-CM | POA: Diagnosis present

## 2022-07-21 DIAGNOSIS — J84112 Idiopathic pulmonary fibrosis: Secondary | ICD-10-CM | POA: Diagnosis present

## 2022-07-21 DIAGNOSIS — R0902 Hypoxemia: Secondary | ICD-10-CM | POA: Diagnosis present

## 2022-07-21 DIAGNOSIS — Z811 Family history of alcohol abuse and dependence: Secondary | ICD-10-CM | POA: Diagnosis not present

## 2022-07-21 DIAGNOSIS — T380X5A Adverse effect of glucocorticoids and synthetic analogues, initial encounter: Secondary | ICD-10-CM | POA: Diagnosis not present

## 2022-07-21 DIAGNOSIS — D471 Chronic myeloproliferative disease: Secondary | ICD-10-CM | POA: Diagnosis present

## 2022-07-21 DIAGNOSIS — R739 Hyperglycemia, unspecified: Secondary | ICD-10-CM | POA: Diagnosis not present

## 2022-07-21 DIAGNOSIS — Z7722 Contact with and (suspected) exposure to environmental tobacco smoke (acute) (chronic): Secondary | ICD-10-CM | POA: Diagnosis present

## 2022-07-21 DIAGNOSIS — E785 Hyperlipidemia, unspecified: Secondary | ICD-10-CM | POA: Diagnosis present

## 2022-07-21 DIAGNOSIS — R7303 Prediabetes: Secondary | ICD-10-CM | POA: Diagnosis present

## 2022-07-21 DIAGNOSIS — I700 Atherosclerosis of aorta: Secondary | ICD-10-CM | POA: Diagnosis not present

## 2022-07-21 DIAGNOSIS — I272 Pulmonary hypertension, unspecified: Secondary | ICD-10-CM

## 2022-07-21 DIAGNOSIS — Z808 Family history of malignant neoplasm of other organs or systems: Secondary | ICD-10-CM | POA: Diagnosis not present

## 2022-07-21 DIAGNOSIS — Z825 Family history of asthma and other chronic lower respiratory diseases: Secondary | ICD-10-CM | POA: Diagnosis not present

## 2022-07-21 DIAGNOSIS — Y92239 Unspecified place in hospital as the place of occurrence of the external cause: Secondary | ICD-10-CM | POA: Diagnosis not present

## 2022-07-21 DIAGNOSIS — I10 Essential (primary) hypertension: Secondary | ICD-10-CM | POA: Diagnosis present

## 2022-07-21 DIAGNOSIS — L538 Other specified erythematous conditions: Secondary | ICD-10-CM

## 2022-07-21 DIAGNOSIS — D696 Thrombocytopenia, unspecified: Secondary | ICD-10-CM | POA: Diagnosis present

## 2022-07-21 DIAGNOSIS — Z8719 Personal history of other diseases of the digestive system: Secondary | ICD-10-CM | POA: Diagnosis not present

## 2022-07-21 DIAGNOSIS — L03115 Cellulitis of right lower limb: Secondary | ICD-10-CM | POA: Diagnosis present

## 2022-07-21 LAB — ECHOCARDIOGRAM COMPLETE
AR max vel: 2.74 cm2
AV Area VTI: 2.47 cm2
AV Area mean vel: 2.54 cm2
AV Mean grad: 6 mmHg
AV Peak grad: 10.6 mmHg
Ao pk vel: 1.63 m/s
Area-P 1/2: 3.85 cm2
Height: 67 in
S' Lateral: 3.2 cm
Weight: 2603.19 oz

## 2022-07-21 LAB — CBC
HCT: 33.3 % — ABNORMAL LOW (ref 39.0–52.0)
Hemoglobin: 10.1 g/dL — ABNORMAL LOW (ref 13.0–17.0)
MCH: 28.5 pg (ref 26.0–34.0)
MCHC: 30.3 g/dL (ref 30.0–36.0)
MCV: 94.1 fL (ref 80.0–100.0)
Platelets: 175 10*3/uL (ref 150–400)
RBC: 3.54 MIL/uL — ABNORMAL LOW (ref 4.22–5.81)
RDW: 26.5 % — ABNORMAL HIGH (ref 11.5–15.5)
WBC: 16.6 10*3/uL — ABNORMAL HIGH (ref 4.0–10.5)
nRBC: 0.1 % (ref 0.0–0.2)

## 2022-07-21 LAB — BASIC METABOLIC PANEL
Anion gap: 11 (ref 5–15)
BUN: 13 mg/dL (ref 8–23)
CO2: 29 mmol/L (ref 22–32)
Calcium: 8.9 mg/dL (ref 8.9–10.3)
Chloride: 99 mmol/L (ref 98–111)
Creatinine, Ser: 0.84 mg/dL (ref 0.61–1.24)
GFR, Estimated: >60 mL/min (ref >60)
Glucose, Bld: 99 mg/dL (ref 70–99)
Potassium: 4.1 mmol/L (ref 3.5–5.1)
Sodium: 139 mmol/L (ref 135–145)

## 2022-07-21 LAB — GLUCOSE, CAPILLARY
Glucose-Capillary: 165 mg/dL — ABNORMAL HIGH (ref 70–99)
Glucose-Capillary: 194 mg/dL — ABNORMAL HIGH (ref 70–99)

## 2022-07-21 LAB — STREP PNEUMONIAE URINARY ANTIGEN: Strep Pneumo Urinary Antigen: NEGATIVE

## 2022-07-21 LAB — C-REACTIVE PROTEIN: CRP: 1.8 mg/dL — ABNORMAL HIGH (ref <1.0)

## 2022-07-21 LAB — PROCALCITONIN: Procalcitonin: 0.14 ng/mL

## 2022-07-21 LAB — LACTIC ACID, PLASMA: Lactic Acid, Venous: 1.3 mmol/L (ref 0.5–1.9)

## 2022-07-21 LAB — SEDIMENTATION RATE: Sed Rate: 6 mm/hr (ref 0–16)

## 2022-07-21 LAB — HIV ANTIBODY (ROUTINE TESTING W REFLEX): HIV Screen 4th Generation wRfx: NONREACTIVE

## 2022-07-21 MED ORDER — INSULIN ASPART 100 UNIT/ML IJ SOLN: 0.0000 [IU] | INTRAMUSCULAR | Status: DC

## 2022-07-21 MED ORDER — METHYLPREDNISOLONE SODIUM SUCC 125 MG IJ SOLR
80.0000 mg | Freq: Four times a day (QID) | INTRAMUSCULAR | Status: DC
  Administered 2022-07-21 – 2022-07-22 (×3): 80 mg via INTRAVENOUS
  Filled 2022-07-21 (×4): qty 2

## 2022-07-21 MED ORDER — INSULIN ASPART 100 UNIT/ML IJ SOLN: 0.0000 [IU] | Freq: Every day | INTRAMUSCULAR | Status: DC

## 2022-07-21 MED ORDER — FUROSEMIDE 10 MG/ML IJ SOLN
40.0000 mg | Freq: Once | INTRAMUSCULAR | Status: AC
  Administered 2022-07-21: 40 mg via INTRAVENOUS
  Filled 2022-07-21: qty 4

## 2022-07-21 MED ORDER — INSULIN ASPART 100 UNIT/ML IJ SOLN
0.0000 [IU] | Freq: Three times a day (TID) | INTRAMUSCULAR | Status: DC
  Administered 2022-07-21: 3 [IU] via SUBCUTANEOUS
  Administered 2022-07-22: 11 [IU] via SUBCUTANEOUS
  Administered 2022-07-22: 3 [IU] via SUBCUTANEOUS
  Administered 2022-07-22 – 2022-07-23 (×2): 2 [IU] via SUBCUTANEOUS

## 2022-07-21 NOTE — ED Notes (Signed)
Patient lying on back, eyes closed, respirations even and unlabored.  Will defer labs to when awake due to patient being awake all night urinating.

## 2022-07-21 NOTE — ED Notes (Signed)
Echo at bedside

## 2022-07-21 NOTE — Progress Notes (Addendum)
PROGRESS NOTE    [name]  PQZ:[id number] DOB: 06/15/1941 DOA: 07/20/2022 PCP: [name], MD   Brief Narrative:     [name] is a 81 y.o. male with medical history significant of MPD with JAK mutation on Hydrea, ILD with question / suspicion of IPF / UIP (See [name] office note from last month for details), HLD, CAD s/p stent.  Patient was admitted with acute hypoxemic respiratory failure with question of pneumonia versus IPF flare versus some volume overload.  He has been admitted for acute hypoxemic respiratory failure and appears to be responding well to IV diuresis.  He continues to remain on antibiotics and PCCM evaluation pending.  Assessment & Plan:   Principal Problem:   Acute respiratory failure with hypoxia (HCC) Active Problems:   ILD (interstitial lung disease) ([place])   Essential hypertension   Myeloproliferative disorder (HCC)  Assessment and Plan:   Acute respiratory failure with hypoxia ([place]) 81 yo M with h/o ILD, ? of IPF with UIP (see pulm office note). In today with new O2 requirement, bibasilar ground glass opacities superimposed on ILD background on CTA chest. DDx = CAP vs AEIPF vs CHF Has leukocytosis today but looks like that is chronic, baseline, and related to his MPD. Low grade temp of 100 and mild L shift though. COVID, flu, and RSV are all negative. Clinically reports worsening peripheral edema and orthopnea, though BNP is nl. PNA pathway Cont CAP ABx for the moment Asking pulm for formal consult in AM and to weigh in on wether to start steroids / other treatments for possible AEIPF now.  Per Dr. Silas Flood: "would recommend full RVP panel and a CRP and sed rate tonight. if resp panel negative would consider high dose IV steroids - minimum 1 mg/kg but sometimes do more. " I also pointed out that it conveniently looks like [name] (pulmonologist who he sees in office) is working here at [facility] to respond well  to IV dose of Lasix on 12/9, repeat dose today RVP ordered CRP and ESR ordered 2d echo with LVEF 60-65% Ultrasound of lower extremities with no DVT and no signs of PE on CT scan   ILD (interstitial lung disease) ([place]) Question of AEIPF today: Cont chronic home inhalers Discussing with pulm as above-further evaluation pending, but appears to be an IPF flare for which steroids will be ordered  Bilateral lower extremity cellulitis Continue on Rocephin   Myeloproliferative disorder ([place]) Chronic leukocytosis, and had thrombocytopenia in past.  Not thrombocytopenic today with plt count of 209. Follows with heme/onc. Continue hydrea   Essential hypertension Cont home BP meds    DVT prophylaxis: Lovenox Code Status: Full Family Communication: None at bedside Disposition Plan:  Status is: Observation The patient will require care spanning > 2 midnights and should be moved to inpatient because: Need for IV medications.  Consultants:  PCCM  Procedures:  None  Antimicrobials:  Anti-infectives (From admission, onward)    Start     Dose/Rate Route Frequency Ordered Stop   07/21/22 2200  azithromycin (ZITHROMAX) 500 mg in sodium chloride 0.9 % 250 mL IVPB        500 mg 250 mL/hr over 60 Minutes Intravenous Every 24 hours 07/20/22 2255 07/26/22 2159   07/21/22 1000  cefTRIAXone (ROCEPHIN) 2 g in sodium chloride 0.9 % 100 mL IVPB        2 g 200 mL/hr over 30 Minutes Intravenous Every 24 hours 07/20/22 2255 07/26/22 0959  07/20/22 2215  cefTRIAXone (ROCEPHIN) 1 g in sodium chloride 0.9 % 100 mL IVPB        1 g 200 mL/hr over 30 Minutes Intravenous  Once 07/20/22 2213 07/20/22 2315   07/20/22 2215  azithromycin (ZITHROMAX) 500 mg in sodium chloride 0.9 % 250 mL IVPB        500 mg 250 mL/hr over 60 Minutes Intravenous  Once 07/20/22 2213 07/21/22 0054       Subjective: Patient seen and evaluated today with no new acute complaints or concerns. No acute concerns or events noted  overnight.  He seems to be less short of breath this morning.  Objective: Vitals:   07/21/22 1030 07/21/22 1045 07/21/22 1100 07/21/22 1115  BP: (!) 120/50 (!) 121/53 (!) 116/48 120/60  Pulse: 72 73 71 77  Resp: (!) 21 (!) 29 (!) 25 (!) 22  Temp:      TempSrc:      SpO2: 93% 93% 94% 96%  Weight:      Height:        Intake/Output Summary (Last 24 hours) at 07/21/2022 1153 Last data filed at 07/21/2022 1026 Gross per 24 hour  Intake 448.63 ml  Output 1850 ml  Net -1401.37 ml   Filed Weights   07/20/22 1857  Weight: 73.8 kg    Examination:  General exam: Appears calm and comfortable  Respiratory system: Clear to auscultation. Respiratory effort normal.  2.5 L nasal cannula oxygen. Cardiovascular system: S1 & S2 heard, RRR.  Gastrointestinal system: Abdomen is soft Central nervous system: Alert and awake Extremities: No edema Skin: No significant lesions noted Psychiatry: Flat affect.    Data Reviewed: I have personally reviewed following labs and imaging studies  CBC: Recent Labs  Lab 07/17/22 0759 07/20/22 1903 07/21/22 0715  WBC 17.8* 21.1* 16.6*  NEUTROABS 13.0* 17.7*  --   HGB 11.9* 11.2* 10.1*  HCT 38.2* 37.0* 33.3*  MCV 94.6 96.1 94.1  PLT 201 209 326   Basic Metabolic Panel: Recent Labs  Lab 07/17/22 0759 07/20/22 1903 07/21/22 0715  NA 140 138 139  K 4.1 4.0 4.1  CL 103 102 99  CO2 _0 GLUCOSE 108* 116* 99  BUN _1 CREATININE 0.78 0.83 0.84  CALCIUM 10.0 9.5 8.9   GFR: Estimated Creatinine Clearance: 66.7 mL/min (by C-G formula based on SCr of 0.84 mg/dL). Liver Function Tests: Recent Labs  Lab 07/17/22 0759 07/20/22 1903  AST 23 25  ALT 18 19  ALKPHOS 94 87  BILITOT 0.7 0.8  PROT 7.0 7.0  ALBUMIN 4.5 4.0   No results for input(s): "LIPASE", "AMYLASE" in the last 168 hours. No results for input(s): "AMMONIA" in the last 168 hours. Coagulation Profile: No results for input(s): "INR", "PROTIME" in the last 168  hours. Cardiac Enzymes: No results for input(s): "CKTOTAL", "CKMB", "CKMBINDEX", "TROPONINI" in the last 168 hours. BNP (last 3 results) No results for input(s): "PROBNP" in the last 8760 hours. HbA1C: No results for input(s): "HGBA1C" in the last 72 hours. CBG: No results for input(s): "GLUCAP" in the last 168 hours. Lipid Profile: No results for input(s): "CHOL", "HDL", "LDLCALC", "TRIG", "CHOLHDL", "LDLDIRECT" in the last 72 hours. Thyroid Function Tests: No results for input(s): "TSH", "T4TOTAL", "FREET4", "T3FREE", "THYROIDAB" in the last 72 hours. Anemia Panel: No results for input(s): "VITAMINB12", "FOLATE", "FERRITIN", "TIBC", "IRON", "RETICCTPCT" in the last 72 hours. Sepsis Labs: No results for input(s): "PROCALCITON", "LATICACIDVEN" in the last 168 hours.  Recent  Results (from the past 240 hour(s))  Resp panel by RT-PCR (RSV, Flu A&B, Covid) Anterior Nasal Swab     Status: None   Collection Time: 07/20/22  6:57 PM   Specimen: Anterior Nasal Swab  Result Value Ref Range Status   SARS Coronavirus 2 by RT PCR NEGATIVE NEGATIVE Final    Comment: (NOTE) SARS-CoV-2 target nucleic acids are NOT DETECTED.  The SARS-CoV-2 RNA is generally detectable in upper respiratory specimens during the acute phase of infection. The lowest concentration of SARS-CoV-2 viral copies this assay can detect is 138 copies/mL. A negative result does not preclude SARS-Cov-2 infection and should not be used as the sole basis for treatment or other patient management decisions. A negative result may occur with  improper specimen collection/handling, submission of specimen other than nasopharyngeal swab, presence of viral mutation(s) within the areas targeted by this assay, and inadequate number of viral copies(<138 copies/mL). A negative result must be combined with clinical observations, patient history, and epidemiological information. The expected result is Negative.  Fact Sheet for Patients:   EntrepreneurPulse.com.au  Fact Sheet for Healthcare Providers:  IncredibleEmployment.be  This test is no t yet approved or cleared by the [place] FDA and  has been authorized for detection and/or diagnosis of SARS-CoV-2 by FDA under an Emergency Use Authorization (EUA). This EUA will remain  in effect (meaning this test can be used) for the duration of the COVID-19 declaration under Section 564(b)(1) of the Act, 21 U.S.C.section 360bbb-3(b)(1), unless the authorization is terminated  or revoked sooner.       Influenza A by PCR NEGATIVE NEGATIVE Final   Influenza B by PCR NEGATIVE NEGATIVE Final    Comment: (NOTE) The Xpert Xpress SARS-CoV-2/FLU/RSV plus assay is intended as an aid in the diagnosis of influenza from Nasopharyngeal swab specimens and should not be used as a sole basis for treatment. Nasal washings and aspirates are unacceptable for Xpert Xpress SARS-CoV-2/FLU/RSV testing.  Fact Sheet for Patients: EntrepreneurPulse.com.au  Fact Sheet for Healthcare Providers: IncredibleEmployment.be  This test is not yet approved or cleared by the [place] FDA and has been authorized for detection and/or diagnosis of SARS-CoV-2 by FDA under an Emergency Use Authorization (EUA). This EUA will remain in effect (meaning this test can be used) for the duration of the COVID-19 declaration under Section 564(b)(1) of the Act, 21 U.S.C. section 360bbb-3(b)(1), unless the authorization is terminated or revoked.     Resp Syncytial Virus by PCR NEGATIVE NEGATIVE Final    Comment: (NOTE) Fact Sheet for Patients: EntrepreneurPulse.com.au  Fact Sheet for Healthcare Providers: IncredibleEmployment.be  This test is not yet approved or cleared by the [place] FDA and has been authorized for detection and/or diagnosis of SARS-CoV-2 by FDA under an Emergency Use  Authorization (EUA). This EUA will remain in effect (meaning this test can be used) for the duration of the COVID-19 declaration under Section 564(b)(1) of the Act, 21 U.S.C. section 360bbb-3(b)(1), unless the authorization is terminated or revoked.  Performed at [facility], [place] [address] [place], [place] [address]   Respiratory (~20 pathogens) panel by PCR     Status: None   Collection Time: 07/20/22  6:57 PM   Specimen: Nasopharyngeal Swab; Respiratory  Result Value Ref Range Status   Adenovirus NOT DETECTED NOT DETECTED Final   Coronavirus 229E NOT DETECTED NOT DETECTED Final    Comment: (NOTE) The Coronavirus on the Respiratory Panel, DOES NOT test for the novel  Coronavirus (2019 nCoV)  Coronavirus HKU1 NOT DETECTED NOT DETECTED Final   Coronavirus NL63 NOT DETECTED NOT DETECTED Final   Coronavirus OC43 NOT DETECTED NOT DETECTED Final   Metapneumovirus NOT DETECTED NOT DETECTED Final   Rhinovirus / Enterovirus NOT DETECTED NOT DETECTED Final   Influenza A NOT DETECTED NOT DETECTED Final   Influenza B NOT DETECTED NOT DETECTED Final   Parainfluenza Virus 1 NOT DETECTED NOT DETECTED Final   Parainfluenza Virus 2 NOT DETECTED NOT DETECTED Final   Parainfluenza Virus 3 NOT DETECTED NOT DETECTED Final   Parainfluenza Virus 4 NOT DETECTED NOT DETECTED Final   Respiratory Syncytial Virus NOT DETECTED NOT DETECTED Final   Bordetella pertussis NOT DETECTED NOT DETECTED Final   Bordetella Parapertussis NOT DETECTED NOT DETECTED Final   Chlamydophila pneumoniae NOT DETECTED NOT DETECTED Final   Mycoplasma pneumoniae NOT DETECTED NOT DETECTED Final    Comment: Performed at [facility], [place] [address] [place], [place] [address]         Radiology Studies: ECHOCARDIOGRAM COMPLETE  Result Date: 07/21/2022    ECHOCARDIOGRAM REPORT   Patient Name:   [name] Date of Exam: 07/21/2022 Medical Rec #:  [id number]       Height:       67.0 in Accession #:     [id number]      Weight:       162.7 lb Date of Birth:  [birth year]      BSA:          1.852 m Patient Age:    48 years        BP:           114/51 mmHg Patient Gender: M               HR:           67 bpm. Exam Location:  Inpatient Procedure: 2D Echo, Cardiac Doppler and Color Doppler Indications:    Pulmonary hypertension I27.2  History:        Patient has prior history of Echocardiogram examinations, most                 recent 05/01/2022. CAD and Angina, COPD; Risk                 Factors:Hypertension, Sleep Apnea and Dyslipidemia.  Sonographer:    Ronny Flurry Referring Phys: [facility] [place] IMPRESSIONS  1. Left ventricular ejection fraction, by estimation, is 60 to 65%. The left ventricle has normal function. The left ventricle has no regional wall motion abnormalities. There is mild left ventricular hypertrophy of the basal-septal segment. Left ventricular diastolic parameters are indeterminate.  2. Right ventricular systolic function is normal. The right ventricular size is normal. Tricuspid regurgitation signal is inadequate for assessing PA pressure.  3. The mitral valve is normal in structure. Trivial mitral valve regurgitation. No evidence of mitral stenosis.  4. The aortic valve is tricuspid. Aortic valve regurgitation is not visualized. Aortic valve sclerosis/calcification is present, without any evidence of aortic stenosis. Aortic valve area, by VTI measures 2.47 cm. Aortic valve mean gradient measures 6.0 mmHg. Aortic valve Vmax measures 1.63 m/s.  5. The inferior vena cava is normal in size with greater than 50% respiratory variability, suggesting right atrial pressure of 3 mmHg. FINDINGS  Left Ventricle: Left ventricular ejection fraction, by estimation, is 60 to 65%. The left ventricle has normal function. The left ventricle has no regional wall motion abnormalities. The left ventricular internal cavity size was normal  in size. There is  mild left ventricular hypertrophy of the basal-septal  segment. Left ventricular diastolic parameters are indeterminate. Normal left ventricular filling pressure. Right Ventricle: The right ventricular size is normal. No increase in right ventricular wall thickness. Right ventricular systolic function is normal. Tricuspid regurgitation signal is inadequate for assessing PA pressure. Left Atrium: Left atrial size was normal in size. Right Atrium: Right atrial size was normal in size. Pericardium: There is no evidence of pericardial effusion. Mitral Valve: The mitral valve is normal in structure. Trivial mitral valve regurgitation. No evidence of mitral valve stenosis. Tricuspid Valve: The tricuspid valve is normal in structure. Tricuspid valve regurgitation is not demonstrated. No evidence of tricuspid stenosis. Aortic Valve: The aortic valve is tricuspid. Aortic valve regurgitation is not visualized. Aortic valve sclerosis/calcification is present, without any evidence of aortic stenosis. Aortic valve mean gradient measures 6.0 mmHg. Aortic valve peak gradient measures 10.6 mmHg. Aortic valve area, by VTI measures 2.47 cm. Pulmonic Valve: The pulmonic valve was normal in structure. Pulmonic valve regurgitation is mild. No evidence of pulmonic stenosis. Aorta: The aortic root is normal in size and structure. Venous: The inferior vena cava is normal in size with greater than 50% respiratory variability, suggesting right atrial pressure of 3 mmHg. IAS/Shunts: No atrial level shunt detected by color flow Doppler.  LEFT VENTRICLE PLAX 2D LVIDd:         5.00 cm   Diastology LVIDs:         3.20 cm   LV e' medial:    5.66 cm/s LV PW:         1.10 cm   LV E/e' medial:  15.7 LV IVS:        1.20 cm   LV e' lateral:   7.62 cm/s LVOT diam:     2.10 cm   LV E/e' lateral: 11.7 LV SV:         82 LV SV Index:   45 LVOT Area:     3.46 cm  RIGHT VENTRICLE             IVC RV Basal diam:  3.00 cm     IVC diam: 1.20 cm RV S prime:     16.10 cm/s RVOT diam:      3.00 cm TAPSE (M-mode): 1.6  cm LEFT ATRIUM             Index        RIGHT ATRIUM          Index LA diam:        4.30 cm 2.32 cm/m   RA Area:     9.85 cm LA Vol (A2C):   51.4 ml 27.75 ml/m  RA Volume:   20.40 ml 11.01 ml/m LA Vol (A4C):   43.6 ml 23.54 ml/m LA Biplane Vol: 51.7 ml 27.91 ml/m  AORTIC VALVE AV Area (Vmax):    2.74 cm AV Area (Vmean):   2.54 cm AV Area (VTI):     2.47 cm AV Vmax:           163.00 cm/s AV Vmean:          108.000 cm/s AV VTI:            0.334 m AV Peak Grad:      10.6 mmHg AV Mean Grad:      6.0 mmHg LVOT Vmax:         129.00 cm/s LVOT Vmean:        79.100  cm/s LVOT VTI:          0.238 m LVOT/AV VTI ratio: 0.71  AORTA Ao Root diam: 3.20 cm Ao Asc diam:  3.10 cm MITRAL VALVE MV Area (PHT): 3.85 cm    SHUNTS MV Decel Time: 197 msec    Systemic VTI:  0.24 m MV E velocity: 89.10 cm/s  Systemic Diam: 2.10 cm MV A velocity: 92.80 cm/s  Pulmonic Diam: 3.00 cm MV E/A ratio:  0.96 Fransico Him MD Electronically signed by Fransico Him MD Signature Date/Time: 07/21/2022/10:38:18 AM    Final    CT Angio Chest PE W and/or Wo Contrast  Result Date: 07/20/2022 CLINICAL DATA:  Patient having dry cough and shortness of breath for 3-4 days. Patient at swelling in the legs and feet in past couple of days. EXAM: CT ANGIOGRAPHY CHEST WITH CONTRAST TECHNIQUE: Multidetector CT imaging of the chest was performed using the standard protocol during bolus administration of intravenous contrast. Multiplanar CT image reconstructions and MIPs were obtained to evaluate the vascular anatomy. RADIATION DOSE REDUCTION: This exam was performed according to the departmental dose-optimization program which includes automated exposure control, adjustment of the mA and/or kV according to patient size and/or use of iterative reconstruction technique. CONTRAST:  55m OMNIPAQUE IOHEXOL 350 MG/ML SOLN COMPARISON:  CT examination dated May 04, 2022 FINDINGS: Cardiovascular: Satisfactory opacification of the pulmonary arteries to the  segmental level. No evidence of pulmonary embolism. Normal heart size. No pericardial effusion. Prominent coronary artery atherosclerotic calcifications. Main pulmonary trunk is dilated measuring up to 3.2 cm concerning for pulmonary arterial hypertension. Mediastinum/Nodes: No enlarged mediastinal, hilar, or axillary lymph nodes. Thyroid gland, trachea, and esophagus demonstrate no significant findings. Lungs/Pleura: Prominence of the central pulmonary vessels as well as mild bilateral perihilar and lower lobe ground-glass opacities. Findings may be secondary to infectious/inflammatory process or mild pulmonary edema in the background of chronic interstitial lung disease with mild bronchiectasis and scarring. No large pleural effusion. Chronic elevation of the right hemidiaphragm, unchanged. Upper Abdomen: No acute abnormality. 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 Review of the MIP images confirms the above findings. IMPRESSION: 1. No evidence of pulmonary embolism. 2. Prominence of the central pulmonary vessels as well as mild bilateral perihilar and lower lobe ground-glass opacities. Findings may be secondary to mild pulmonary edema or infectious/inflammatory process in the background of chronic interstitial lung disease. No large pleural effusion. 3. Prominent coronary artery atherosclerotic calcifications. 4. Main pulmonary trunk is dilated measuring up to 3.2 cm concerning for pulmonary arterial hypertension. Electronically Signed   By: IKeane PoliceD.O.   On: 07/20/2022 22:09   [name] 2 View  Result Date: 07/20/2022 CLINICAL DATA:  Cough, COPD EXAM: CHEST - 2 VIEW COMPARISON:  03/11/2022 FINDINGS: Frontal and lateral views of the chest demonstrate a stable cardiac silhouette. Chronic elevation of the right hemidiaphragm. Stable bibasilar fibrosis and bronchiectasis. No acute airspace disease, effusion, or pneumothorax.  IMPRESSION: 1. Stable bibasilar fibrosis and bronchiectasis. 2. No acute airspace disease. 3. Chronic elevation right hemidiaphragm. Electronically Signed   By: MRanda NgoM.D.   On: 07/20/2022 17:35        Scheduled Meds:  amLODipine  2.5 mg Oral Daily   aspirin EC  81 mg Oral Daily   enoxaparin (LOVENOX) injection  40 mg Subcutaneous Q24H   furosemide  40 mg Intravenous Once   hydroxyurea  500 mg Oral QHS   ipratropium-albuterol  3 mL Nebulization Once  mometasone-formoterol  2 puff Inhalation BID   pantoprazole  40 mg Oral Daily   simvastatin  20 mg Oral Daily   Continuous Infusions:  azithromycin     cefTRIAXone (ROCEPHIN)  IV Stopped (07/21/22 1026)     LOS: 0 days    Time spent: 35 minutes    Axell Trigueros Darleen Crocker, DO [facility]  If 7PM-7AM, please contact night-coverage [email or web address] 07/21/2022, 11:53 AM

## 2022-07-21 NOTE — ED Notes (Signed)
Breakfast tray provided. 

## 2022-07-21 NOTE — Progress Notes (Incomplete)
  Echocardiogram 2D Echocardiogram has been performed.  [name] 07/21/2022, 9:10 AM

## 2022-07-21 NOTE — Consult Note (Signed)
NAME:  TENNESSEE PERRA, MRN:  [id number], DOB:  [birth year], LOS: 0 ADMISSION DATE:  07/20/2022, CONSULTATION DATE:  07/21/22 REFERRING MD:  Dr Heath Lark and Rockne Coons, CHIEF COMPLAINT:  Acy hypoxmeic resp failure - backdrop ID    History of Present Illness:  81 year old male who I saw for the first time in September 2023 for interstitial lung disease.  At that time his symptoms are chronic cough there was concern of progressive phenotype because he had already had ILD.  He had a family history of COPD.  He never smoked cigarettes but did do some passive smoking.  He was in the [place] and had agent orange exposure.  He worked in [employer].  All this along with age greater than 33 and Caucasian put him at risk for IPF.  However he did have bird exposure at this house for many years.  I saw him back in November 2023.  High-resolution CT chest thoracic radiology opinion more consistent with hypersensitive pneumonitis consistent with his bird exposure..  He has had progression on both CT scan of the chest and pulmonary function test [see below] just prior to Thanksgiving 2023 indicated him to get rid of the birds and consider antifibrotic nintedanib.  However around this time he started calling into the office complaining of some mild onset hemoptysis [currently not an issue] and at this ER visit is reporting 2 to 3-week worsening dyspnea and cough and 2 days of worsening pedal edema.  He had new onset hypoxemia at rest.  Was hypoxemic requiring few liters nasal cannula.  According to the hospitalist he has responded to Lasix.  After arrival he has developed bilateral distal one third erythema on both shin along with warmth and tenderness to touch but no crepitus.  He is on Rocephin and azithromycin.  His BNP, troponin respiratory virus panel urine strep are negative his CT angiogram chest is negative for PE.  His echocardiogram shows good ejection fraction and no evidence of RV strain.  But he is  definitely hypoxemic.  Pulmonary has been consulted.  Of note he is yet to start his antifibrotic.  Past Medical History:      Latest Ref Rng & Units 06/05/2022    3:46 PM 07/29/2019    8:58 AM 10/17/2016    9:59 AM  PFT Results  FVC-Pre L 2.01  2.57  2.73   FVC-Predicted Pre % 56  70  73   FVC-Post L 2.01  2.60  2.76   FVC-Predicted Post % 56  71  74   Pre FEV1/FVC % % 87  87  88   Post FEV1/FCV % % 92  90  87   FEV1-Pre L 1.74  2.23  2.39   FEV1-Predicted Pre % 69  85  89   FEV1-Post L 1.85  2.34  2.40   DLCO uncorrected ml/min/mmHg 11.37  15.24  16.71   DLCO UNC% % 51  68  60   DLCO corrected ml/min/mmHg 12.63   16.07   DLCO COR %Predicted % 57   58   DLVA Predicted % 91  98  90   TLC L 3.65  3.89  4.37   TLC % Predicted % 57  61  69   RV % Predicted % 61  53  59       has a past medical history of Abnormal chest x-ray, Allergic rhinitis, Allergy, Ankylosing spondylitis (HCC), Anxiety, Arthritis, Colonic polyp, Coronary atherosclerosis of native coronary artery, Cough, Dizziness (  10/21/2016), Ganglion cyst of wrist, Heart attack ([place]) (2008), Hemorrhoids, History of malaria, Hyperlipidemia, Hypertension, Kidney stone, Other disorder of muscle, ligament, and fascia, Rotator cuff tear, and Swelling of lower extremity (10/16/2015).   reports that he has never smoked. He has never been exposed to tobacco smoke. He has never used smokeless tobacco.  Past Surgical History:  Procedure Laterality Date   bicep rupture     CORONARY ANGIOPLASTY WITH STENT PLACEMENT     HEMORRHOID SURGERY  05/2009   in [facility]   LEFT HEART CATH AND CORONARY ANGIOGRAPHY N/A 01/14/2017   Procedure: Left Heart Cath and Coronary Angiography;  Surgeon: [name], MD;  Location: [facility];  Service: Cardiovascular;  Laterality: N/A;   LEFT HEART CATHETERIZATION WITH CORONARY ANGIOGRAM N/A 09/03/2012   Procedure: LEFT HEART CATHETERIZATION WITH CORONARY ANGIOGRAM;  Surgeon: [name], MD;   Location: [facility] CATH LAB;  Service: Cardiovascular;  Laterality: N/A;   PROSTATE BIOPSY     ROTATOR CUFF REPAIR     bilat by Dr. French Ana    Allergies  Allergen Reactions   Celebrex [Celecoxib]     Pt states precipitated his heart attack.   Tramadol Other (See Comments)    Makes patient jumpy    Immunization History  Administered Date(s) Administered   Fluad Quad(high Dose 65+) 05/01/2019, 05/28/2021, 05/01/2022   Influenza Split 06/01/2012   Influenza Whole 05/30/2009, 04/20/2010   Influenza, High Dose Seasonal PF 05/19/2017, 05/19/2018   Influenza-Unspecified 05/13/2007, 07/12/2013, 05/10/2016, 06/23/2020   PFIZER(Purple Top)SARS-COV-2 Vaccination 09/18/2019, 10/09/2019, 07/13/2020   PNEUMOCOCCAL CONJUGATE-20 01/14/2022   Pneumococcal Conjugate-13 05/19/2018   Pneumococcal Polysaccharide-23 08/22/2009   Tdap 12/04/2010, 06/04/2011, 07/31/2018   Zoster Recombinat (Shingrix) 01/14/2022   Zoster, Live 05/17/2008   Zoster, Unspecified 04/17/2022    Family History  Problem Relation Age of Onset   Cancer Mother        melanoma   Emphysema Mother    Rheum arthritis Mother    Diabetes Mother    Alcohol abuse Father      Current Facility-Administered Medications:    amLODipine (NORVASC) tablet 2.5 mg, 2.5 mg, Oral, Daily, Alcario Drought, Jared M, DO, 2.5 mg at 07/21/22 1829   aspirin EC tablet 81 mg, 81 mg, Oral, Daily, Jennette Kettle M, DO, 81 mg at 07/21/22 0949   azithromycin (ZITHROMAX) 500 mg in sodium chloride 0.9 % 250 mL IVPB, 500 mg, Intravenous, Q24H, Alcario Drought, Jared M, DO   cefTRIAXone (ROCEPHIN) 2 g in sodium chloride 0.9 % 100 mL IVPB, 2 g, Intravenous, Q24H, Alcario Drought, Jared M, DO, Stopped at 07/21/22 1026   enoxaparin (LOVENOX) injection 40 mg, 40 mg, Subcutaneous, Q24H, Alcario Drought, Jared M, DO, 40 mg at 07/21/22 0950   furosemide (LASIX) injection 40 mg, 40 mg, Intravenous, Once, Shah, Pratik D, DO   hydroxyurea (HYDREA) capsule 500 mg, 500 mg, Oral, QHS, Gardner, Jared M,  DO   ipratropium-albuterol (DUONEB) 0.5-2.5 (3) MG/3ML nebulizer solution 3 mL, 3 mL, Nebulization, Once, [employer], [place]   mometasone-formoterol (DULERA) 100-5 MCG/ACT inhaler 2 puff, 2 puff, Inhalation, BID, Alcario Drought, Jared M, DO   pantoprazole (PROTONIX) EC tablet 40 mg, 40 mg, Oral, Daily, Alcario Drought, Jared M, DO, 40 mg at 07/21/22 0949   simvastatin (ZOCOR) tablet 20 mg, 20 mg, Oral, Daily, Alcario Drought, Jared M, DO, 20 mg at 07/21/22 9371  Current Outpatient Medications:    acetaminophen (TYLENOL) 325 MG tablet, Take 650 mg by mouth every 6 (six) hours as needed for mild pain or moderate pain.,  Disp: , Rfl:    amLODipine (NORVASC) 2.5 MG tablet, Take 1 tablet (2.5 mg total) by mouth daily., Disp: 90 tablet, Rfl: 1   Apoaequorin (PREVAGEN PO), Take 1 tablet by mouth daily. , Disp: , Rfl:    aspirin 81 MG EC tablet, Take 81 mg by mouth daily., Disp: , Rfl:    budesonide-formoterol (SYMBICORT) 80-4.5 MCG/ACT inhaler, Inhale 2 puffs into the lungs 2 (two) times daily. (Patient taking differently: Inhale 2 puffs into the lungs as needed.), Disp: 1 each, Rfl: 3   Cholecalciferol (VITAMIN D3) 1.25 MG (50000 UT) CAPS, Take 1 capsule by mouth daily. Pt does not know dose., Disp: , Rfl:    hydroxyurea (HYDREA) 500 MG capsule, Take 1 capsule (500 mg total) by mouth daily. May take with food to minimize GI side effects. (Patient taking differently: Take 500 mg by mouth daily. May take with food to minimize GI side effects.  (At 7:00pm)), Disp: 30 capsule, Rfl: 3   Multiple Vitamins-Minerals (ICAPS AREDS 2) CAPS, Take by mouth daily., Disp: , Rfl:    pantoprazole (PROTONIX) 40 MG tablet, TAKE 1 TABLET 1 TIME DAILY 30 TO 60 MINUTES BEFORE FIRST MEAL OF THE DAY (Patient taking differently: See admin instructions. TAKE 1 TABLET 1 TIME DAILY 30 TO 60 MINUTES BEFORE FIRST MEAL OF THE DAY), Disp: 90 tablet, Rfl: 3   simvastatin (ZOCOR) 20 MG tablet, TAKE 1 TABLET EVERY DAY WITH BREAKFAST (APPOINTMENT IS NEEDED FOR  REFILLS) (Patient taking differently: Take 20 mg by mouth daily. WITH BREAKFAST (APPOINTMENT IS NEEDED FOR REFILLS)), Disp: 90 tablet, Rfl: 3   valACYclovir (VALTREX) 500 MG tablet, Take 1 tablet (500 mg total) by mouth 2 (two) times daily. Take at 1st sign of penile rash., Disp: 6 tablet, Rfl: 1   prochlorperazine (COMPAZINE) 10 MG tablet, Take 1 tablet (10 mg total) by mouth every 6 (six) hours as needed for nausea or vomiting. (Patient not taking: Reported on 07/20/2022), Disp: 30 tablet, Rfl: 0   UNABLE TO FIND, Med Name: CPAP per Dr Halford Chessman, Disp: , Rfl:      [facility] Events:  07/20/2022 - admit    Objective   Blood pressure 120/60, pulse 77, temperature 98.6 F (37 C), temperature source Oral, resp. rate (!) 22, height '5\' 7"'$  (1.702 m), weight 73.8 kg, SpO2 96 %.        Intake/Output Summary (Last 24 hours) at 07/21/2022 1200 Last data filed at 07/21/2022 1026 Gross per 24 hour  Intake 448.63 ml  Output 1850 ml  Net -1401.37 ml   Filed Weights   07/20/22 1857  Weight: 73.8 kg    Examination: General: Sitting in the stretcher in room 37 [place], ER.  2 L nasal cannula pulse ox 97% HENT: No elevated JVP no neck nodes Lungs: No distress.  Some crackles present Cardiovascular: Regular rate and rhythm normal heart sounds Abdomen: Nontender no organomegaly Extremities: Bilateral new pedal edema present.  Distal one third is red and warm to touch no crepitus no gangrene.  No cut injury Neuro: Alert and oriented x 3.  Moves all 4 extremities GU: Not examined  Resolved Hospital Problem list   x  Assessment & Plan:    PULMONARY  A:  Interstitial lung disease-progressive phenotype:  -Chronic hypersensitive pneumonitis [based on bird exposure at home times cockatiel x 20 years] most likely diagnosis .  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   -  Pending starting antifibrotic  nintedanib   07/21/2022 -> currently suspect acute flareup [heart failure, MI, PE, respiratory virus all negative] -especially with 3-week worsening [consistent time course]  P:   Get high-resolution CT chest without contrast to document groundglass opacities that would be consistent with flareup  Check allergen panel RAST  IV steroids [risk benefits and limitations expressed) soumedrol '80mg'$  Q6h ( can escalate if needed)  -3 week taper nededed  - ssi  Reascertain if he got rid of his home bird cockatiel  Oxygen for pulse ox goal greater than 92%.  [Did explain to him that he might need oxygen for discharge]   INFECTIOUS A:   Bilateral lower extremity cellulitis -present on admission    P:   Stop Zithromax Antibiotics per hospitalist Check procalcitonin and lactic acid   Best practice (daily eval):  According to the hospitalist  Family Updates: Patient bedside in the emergency department and later I spoke to the hospitalist       SIGNATURE    Dr. Brand Males, M.D., F.C.C.P,  Pulmonary and Critical Care Medicine Staff Physician, [facility] Director - Interstitial Lung Disease  Program  Pulmonary [place] [facility] [place], [place], [address]  NPI Number:  NPI #[id number]  Pager: [phone], If no answer  -> Check AMION or Try [phone] Telephone (clinical office): [phone] Telephone (research): [phone]  12:00 PM 07/21/2022   07/21/2022 12:00 PM    LABS    Latest Reference Range & Units 09/12/06 13:03 11/28/15 10:08 07/20/22 19:03  B Natriuretic Peptide 0.0 - 100.0 pg/mL   55.8  Brain Natriuretic Peptide <100 pg/mL  17.2   Pro B Natriuretic peptide (BNP) 0.0 - 100.0 pg/mL 53.0      Latest Reference Range & Units 07/20/22 19:03 07/20/22 20:40  Troponin I (High Sensitivity) <18 ng/L 9 9   PULMONARY No results for input(s): "PHART", "PCO2ART", "PO2ART", "HCO3", "TCO2",  "O2SAT" in the last 168 hours.  Invalid input(s): "PCO2", "PO2"  CBC Recent Labs  Lab 07/17/22 0759 07/20/22 1903 07/21/22 0715  HGB 11.9* 11.2* 10.1*  HCT 38.2* 37.0* 33.3*  WBC 17.8* 21.1* 16.6*  PLT 201 209 175    COAGULATION No results for input(s): "INR" in the last 168 hours.  CARDIAC  No results for input(s): "TROPONINI" in the last 168 hours. No results for input(s): "PROBNP" in the last 168 hours.  CHEMISTRY Recent Labs  Lab 07/17/22 0759 07/20/22 1903 07/21/22 0715  NA 140 138 139  K 4.1 4.0 4.1  CL 103 102 99  CO2 '31 26 29  '$ GLUCOSE 108* 116* 99  BUN '14 16 13  '$ CREATININE 0.78 0.83 0.84  CALCIUM 10.0 9.5 8.9   Estimated Creatinine Clearance: 66.7 mL/min (by C-G formula based on SCr of 0.84 mg/dL).   LIVER Recent Labs  Lab 07/17/22 0759 07/20/22 1903  AST 23 25  ALT 18 19  ALKPHOS 94 87  BILITOT 0.7 0.8  PROT 7.0 7.0  ALBUMIN 4.5 4.0     INFECTIOUS No results for input(s): "LATICACIDVEN", "PROCALCITON" in the last 168 hours.   ENDOCRINE CBG (last 3)  No results for input(s): "GLUCAP" in the last 72 hours.       IMAGING x48h  - image(s) personally visualized  -   highlighted in bold VAS [place] LOWER EXTREMITY VENOUS (DVT) (7a-7p)  Result Date: 07/21/2022  Lower Venous DVT Study Patient Name:  Rumaldo L Liptak  Date of Exam:   07/21/2022 Medical Rec #: [id number]        Accession #:    [id number] Date of Birth: March 26, 1941       Patient Gender: M Patient Age:   46 years Exam Location:  [facility] Procedure:      VAS [place] LOWER EXTREMITY VENOUS (DVT) Referring Phys: COOPER ROBBINS --------------------------------------------------------------------------------  Indications: Pain, Swelling, Erythema, and SOB.  Comparison Study: Prior negative right LEV done 10/16/15 Performing Technologist: Sharion Dove RVS  Examination Guidelines: A complete evaluation includes B-mode imaging, spectral Doppler, color Doppler, and power Doppler as needed of  all accessible portions of each vessel. Bilateral testing is considered an integral part of a complete examination. Limited examinations for reoccurring indications may be performed as noted. The reflux portion of the exam is performed with the patient in reverse Trendelenburg.  +---------+---------------+---------+-----------+----------+--------------+ RIGHT    CompressibilityPhasicitySpontaneityPropertiesThrombus Aging +---------+---------------+---------+-----------+----------+--------------+ CFV      Full           Yes      Yes                                 +---------+---------------+---------+-----------+----------+--------------+ SFJ      Full                                                        +---------+---------------+---------+-----------+----------+--------------+ FV Prox  Full                                                        +---------+---------------+---------+-----------+----------+--------------+ FV Mid   Full                                                        +---------+---------------+---------+-----------+----------+--------------+ FV DistalFull                                                        +---------+---------------+---------+-----------+----------+--------------+ PFV      Full                                                        +---------+---------------+---------+-----------+----------+--------------+ POP      Full           Yes      Yes                                 +---------+---------------+---------+-----------+----------+--------------+ PTV      Full                                                        +---------+---------------+---------+-----------+----------+--------------+  PERO     Full                                                        +---------+---------------+---------+-----------+----------+--------------+    +---------+---------------+---------+-----------+----------+--------------+ LEFT     CompressibilityPhasicitySpontaneityPropertiesThrombus Aging +---------+---------------+---------+-----------+----------+--------------+ CFV      Full           Yes      Yes                                 +---------+---------------+---------+-----------+----------+--------------+ SFJ      Full                                                        +---------+---------------+---------+-----------+----------+--------------+ FV Prox  Full                                                        +---------+---------------+---------+-----------+----------+--------------+ FV Mid   Full                                                        +---------+---------------+---------+-----------+----------+--------------+ FV DistalFull                                                        +---------+---------------+---------+-----------+----------+--------------+ PFV      Full                                                        +---------+---------------+---------+-----------+----------+--------------+ POP      Full           Yes      Yes                                 +---------+---------------+---------+-----------+----------+--------------+ PTV      Full                                                        +---------+---------------+---------+-----------+----------+--------------+ PERO     Full                                                        +---------+---------------+---------+-----------+----------+--------------+  Summary: BILATERAL: - No evidence of deep vein thrombosis seen in the lower extremities, bilaterally. -No evidence of popliteal cyst, bilaterally. RIGHT: - Ultrasound characteristics of enlarged lymph nodes are noted in the groin.  LEFT: - Ultrasound characteristics of enlarged lymph nodes noted in the groin.  *See table(s) above for measurements  and observations.    Preliminary    ECHOCARDIOGRAM COMPLETE  Result Date: 07/21/2022    ECHOCARDIOGRAM REPORT   Patient Name:   MYRTLE BARNHARD Date of Exam: 07/21/2022 Medical Rec #:  [id number]       Height:       67.0 in Accession #:    [id number]      Weight:       162.7 lb Date of Birth:  1941-07-15      BSA:          1.852 m Patient Age:    56 years        BP:           114/51 mmHg Patient Gender: M               HR:           67 bpm. Exam Location:  Inpatient Procedure: 2D Echo, Cardiac Doppler and Color Doppler Indications:    Pulmonary hypertension I27.2  History:        Patient has prior history of Echocardiogram examinations, most                 recent 05/01/2022. CAD and Angina, COPD; Risk                 Factors:Hypertension, Sleep Apnea and Dyslipidemia.  Sonographer:    Ronny Flurry Referring Phys: [facility] IMPRESSIONS  1. Left ventricular ejection fraction, by estimation, is 60 to 65%. The left ventricle has normal function. The left ventricle has no regional wall motion abnormalities. There is mild left ventricular hypertrophy of the basal-septal segment. Left ventricular diastolic parameters are indeterminate.  2. Right ventricular systolic function is normal. The right ventricular size is normal. Tricuspid regurgitation signal is inadequate for assessing PA pressure.  3. The mitral valve is normal in structure. Trivial mitral valve regurgitation. No evidence of mitral stenosis.  4. The aortic valve is tricuspid. Aortic valve regurgitation is not visualized. Aortic valve sclerosis/calcification is present, without any evidence of aortic stenosis. Aortic valve area, by VTI measures 2.47 cm. Aortic valve mean gradient measures 6.0 mmHg. Aortic valve Vmax measures 1.63 m/s.  5. The inferior vena cava is normal in size with greater than 50% respiratory variability, suggesting right atrial pressure of 3 mmHg. FINDINGS  Left Ventricle: Left ventricular ejection fraction, by estimation,  is 60 to 65%. The left ventricle has normal function. The left ventricle has no regional wall motion abnormalities. The left ventricular internal cavity size was normal in size. There is  mild left ventricular hypertrophy of the basal-septal segment. Left ventricular diastolic parameters are indeterminate. Normal left ventricular filling pressure. Right Ventricle: The right ventricular size is normal. No increase in right ventricular wall thickness. Right ventricular systolic function is normal. Tricuspid regurgitation signal is inadequate for assessing PA pressure. Left Atrium: Left atrial size was normal in size. Right Atrium: Right atrial size was normal in size. Pericardium: There is no evidence of pericardial effusion. Mitral Valve: The mitral valve is normal in structure. Trivial mitral valve regurgitation. No evidence of mitral valve stenosis. Tricuspid Valve: The tricuspid valve is normal in structure. Tricuspid valve regurgitation  is not demonstrated. No evidence of tricuspid stenosis. Aortic Valve: The aortic valve is tricuspid. Aortic valve regurgitation is not visualized. Aortic valve sclerosis/calcification is present, without any evidence of aortic stenosis. Aortic valve mean gradient measures 6.0 mmHg. Aortic valve peak gradient measures 10.6 mmHg. Aortic valve area, by VTI measures 2.47 cm. Pulmonic Valve: The pulmonic valve was normal in structure. Pulmonic valve regurgitation is mild. No evidence of pulmonic stenosis. Aorta: The aortic root is normal in size and structure. Venous: The inferior vena cava is normal in size with greater than 50% respiratory variability, suggesting right atrial pressure of 3 mmHg. IAS/Shunts: No atrial level shunt detected by color flow Doppler.  LEFT VENTRICLE PLAX 2D LVIDd:         5.00 cm   Diastology LVIDs:         3.20 cm   LV e' medial:    5.66 cm/s LV PW:         1.10 cm   LV E/e' medial:  15.7 LV IVS:        1.20 cm   LV e' lateral:   7.62 cm/s LVOT diam:      2.10 cm   LV E/e' lateral: 11.7 LV SV:         82 LV SV Index:   45 LVOT Area:     3.46 cm  RIGHT VENTRICLE             IVC RV Basal diam:  3.00 cm     IVC diam: 1.20 cm RV S prime:     16.10 cm/s RVOT diam:      3.00 cm TAPSE (M-mode): 1.6 cm LEFT ATRIUM             Index        RIGHT ATRIUM          Index LA diam:        4.30 cm 2.32 cm/m   RA Area:     9.85 cm LA Vol (A2C):   51.4 ml 27.75 ml/m  RA Volume:   20.40 ml 11.01 ml/m LA Vol (A4C):   43.6 ml 23.54 ml/m LA Biplane Vol: 51.7 ml 27.91 ml/m  AORTIC VALVE AV Area (Vmax):    2.74 cm AV Area (Vmean):   2.54 cm AV Area (VTI):     2.47 cm AV Vmax:           163.00 cm/s AV Vmean:          108.000 cm/s AV VTI:            0.334 m AV Peak Grad:      10.6 mmHg AV Mean Grad:      6.0 mmHg LVOT Vmax:         129.00 cm/s LVOT Vmean:        79.100 cm/s LVOT VTI:          0.238 m LVOT/AV VTI ratio: 0.71  AORTA Ao Root diam: 3.20 cm Ao Asc diam:  3.10 cm MITRAL VALVE MV Area (PHT): 3.85 cm    SHUNTS MV Decel Time: 197 msec    Systemic VTI:  0.24 m MV E velocity: 89.10 cm/s  Systemic Diam: 2.10 cm MV A velocity: 92.80 cm/s  Pulmonic Diam: 3.00 cm MV E/A ratio:  0.96 Fransico Him MD Electronically signed by Fransico Him MD Signature Date/Time: 07/21/2022/10:38:18 AM    Final    CT Angio Chest PE W and/or Wo Contrast  Result Date: 07/20/2022  CLINICAL DATA:  Patient having dry cough and shortness of breath for 3-4 days. Patient at swelling in the legs and feet in past couple of days. EXAM: CT ANGIOGRAPHY CHEST WITH CONTRAST TECHNIQUE: Multidetector CT imaging of the chest was performed using the standard protocol during bolus administration of intravenous contrast. Multiplanar CT image reconstructions and MIPs were obtained to evaluate the vascular anatomy. RADIATION DOSE REDUCTION: This exam was performed according to the departmental dose-optimization program which includes automated exposure control, adjustment of the mA and/or kV according to patient size  and/or use of iterative reconstruction technique. CONTRAST:  48m OMNIPAQUE IOHEXOL 350 MG/ML SOLN COMPARISON:  CT examination dated May 04, 2022 FINDINGS: Cardiovascular: Satisfactory opacification of the pulmonary arteries to the segmental level. No evidence of pulmonary embolism. Normal heart size. No pericardial effusion. Prominent coronary artery atherosclerotic calcifications. Main pulmonary trunk is dilated measuring up to 3.2 cm concerning for pulmonary arterial hypertension. Mediastinum/Nodes: No enlarged mediastinal, hilar, or axillary lymph nodes. Thyroid gland, trachea, and esophagus demonstrate no significant findings. Lungs/Pleura: Prominence of the central pulmonary vessels as well as mild bilateral perihilar and lower lobe ground-glass opacities. Findings may be secondary to infectious/inflammatory process or mild pulmonary edema in the background of chronic interstitial lung disease with mild bronchiectasis and scarring. No large pleural effusion. Chronic elevation of the right hemidiaphragm, unchanged. Upper Abdomen: No acute abnormality. 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 Review of the MIP images confirms the above findings. IMPRESSION: 1. No evidence of pulmonary embolism. 2. Prominence of the central pulmonary vessels as well as mild bilateral perihilar and lower lobe ground-glass opacities. Findings may be secondary to mild pulmonary edema or infectious/inflammatory process in the background of chronic interstitial lung disease. No large pleural effusion. 3. Prominent coronary artery atherosclerotic calcifications. 4. Main pulmonary trunk is dilated measuring up to 3.2 cm concerning for pulmonary arterial hypertension. Electronically Signed   By: IKeane PoliceD.O.   On: 07/20/2022 22:09   [name] 2 View  Result Date: 07/20/2022 CLINICAL DATA:  Cough, COPD EXAM: CHEST - 2 VIEW COMPARISON:  03/11/2022  FINDINGS: Frontal and lateral views of the chest demonstrate a stable cardiac silhouette. Chronic elevation of the right hemidiaphragm. Stable bibasilar fibrosis and bronchiectasis. No acute airspace disease, effusion, or pneumothorax. IMPRESSION: 1. Stable bibasilar fibrosis and bronchiectasis. 2. No acute airspace disease. 3. Chronic elevation right hemidiaphragm. Electronically Signed   By: MRanda NgoM.D.   On: 07/20/2022 17:35

## 2022-07-21 NOTE — Progress Notes (Signed)
Pt A&OX4. BP (!) 134/57   Pulse 81   Temp 98.6 F (37 C) (Oral)   Resp 14   Ht '5\' 7"'$  (1.702 m)   Wt 73.8 kg   SpO2 97%   BMI 25.48 kg/m  On tele, NSR. No c/o pain. Bed alarm on, 2/4 rails up call bell near by. No skin issues, no WOB currently. Louanne Skye 07/21/22 7:11 PM

## 2022-07-21 NOTE — Progress Notes (Signed)
VASCULAR LAB    Bilateral lower extremity venous duplex has been performed.  See CV proc for preliminary results.   Kiriana Worthington, RVT 07/21/2022, 10:51 AM

## 2022-07-22 ENCOUNTER — Encounter (HOSPITAL_COMMUNITY): Payer: Self-pay | Admitting: Internal Medicine

## 2022-07-22 DIAGNOSIS — J9601 Acute respiratory failure with hypoxia: Secondary | ICD-10-CM | POA: Diagnosis not present

## 2022-07-22 LAB — BASIC METABOLIC PANEL
Anion gap: 13 (ref 5–15)
BUN: 24 mg/dL — ABNORMAL HIGH (ref 8–23)
CO2: 26 mmol/L (ref 22–32)
Calcium: 9.3 mg/dL (ref 8.9–10.3)
Chloride: 102 mmol/L (ref 98–111)
Creatinine, Ser: 1.04 mg/dL (ref 0.61–1.24)
GFR, Estimated: >60 mL/min (ref >60)
Glucose, Bld: 171 mg/dL — ABNORMAL HIGH (ref 70–99)
Potassium: 3.9 mmol/L (ref 3.5–5.1)
Sodium: 141 mmol/L (ref 135–145)

## 2022-07-22 LAB — PHOSPHORUS: Phosphorus: 4.8 mg/dL — ABNORMAL HIGH (ref 2.5–4.6)

## 2022-07-22 LAB — PROCALCITONIN: Procalcitonin: 0.15 ng/mL

## 2022-07-22 LAB — GLUCOSE, CAPILLARY
Glucose-Capillary: 155 mg/dL — ABNORMAL HIGH (ref 70–99)
Glucose-Capillary: 301 mg/dL — ABNORMAL HIGH (ref 70–99)
Glucose-Capillary: 98 mg/dL (ref 70–99)
Glucose-Capillary: 124 mg/dL — ABNORMAL HIGH (ref 70–99)
Glucose-Capillary: 136 mg/dL — ABNORMAL HIGH (ref 70–99)

## 2022-07-22 LAB — CBC
HCT: 34.6 % — ABNORMAL LOW (ref 39.0–52.0)
Hemoglobin: 10.9 g/dL — ABNORMAL LOW (ref 13.0–17.0)
MCH: 29.9 pg (ref 26.0–34.0)
MCHC: 31.5 g/dL (ref 30.0–36.0)
MCV: 94.8 fL (ref 80.0–100.0)
Platelets: 188 10*3/uL (ref 150–400)
RBC: 3.65 MIL/uL — ABNORMAL LOW (ref 4.22–5.81)
RDW: 25.3 % — ABNORMAL HIGH (ref 11.5–15.5)
WBC: 25.2 10*3/uL — ABNORMAL HIGH (ref 4.0–10.5)
nRBC: 0.1 % (ref 0.0–0.2)

## 2022-07-22 LAB — MAGNESIUM: Magnesium: 2.1 mg/dL (ref 1.7–2.4)

## 2022-07-22 LAB — HEMOGLOBIN A1C
Hgb A1c MFr Bld: 5.4 % (ref 4.8–5.6)
Mean Plasma Glucose: 108 mg/dL

## 2022-07-22 MED ORDER — SULFAMETHOXAZOLE-TRIMETHOPRIM 800-160 MG PO TABS
1.0000 | ORAL_TABLET | ORAL | Status: DC
  Administered 2022-07-22: 1 via ORAL
  Filled 2022-07-22: qty 1

## 2022-07-22 MED ORDER — PREDNISONE 20 MG PO TABS: 40.0000 mg | ORAL_TABLET | Freq: Every day | ORAL | Status: DC

## 2022-07-22 MED ORDER — PREDNISONE 20 MG PO TABS: 20.0000 mg | ORAL_TABLET | Freq: Every day | ORAL | Status: DC

## 2022-07-22 MED ORDER — PANTOPRAZOLE SODIUM 40 MG PO TBEC
40.0000 mg | DELAYED_RELEASE_TABLET | Freq: Two times a day (BID) | ORAL | Status: DC
  Administered 2022-07-22 – 2022-07-23 (×2): 40 mg via ORAL
  Filled 2022-07-22 (×2): qty 1

## 2022-07-22 MED ORDER — PREDNISONE 50 MG PO TABS
60.0000 mg | ORAL_TABLET | Freq: Every day | ORAL | Status: DC
  Administered 2022-07-23: 60 mg via ORAL
  Filled 2022-07-22: qty 1

## 2022-07-22 MED ORDER — GUAIFENESIN ER 600 MG PO TB12
1200.0000 mg | ORAL_TABLET | Freq: Two times a day (BID) | ORAL | Status: DC
  Administered 2022-07-22 – 2022-07-23 (×3): 1200 mg via ORAL
  Filled 2022-07-22 (×3): qty 2

## 2022-07-22 NOTE — Progress Notes (Signed)
Patient at rest saturating at 91% Sp02 RA. Patient ambulated in room and desaturated to 84% Sp02 on RA. Patient denies any SOB while ambulating.  Patient back in bed at rest saturating at 85% Sp02 on RA. Patient put back on 2L [place] saturating at 96% Sp02 at rest in bed

## 2022-07-22 NOTE — Progress Notes (Signed)
NAME:  [name], MRN:  [id number], DOB:  [birth year], LOS: 1 ADMISSION DATE:  07/20/2022, CONSULTATION DATE:  07/21/22 REFERRING MD:  Dr Heath Lark and Rockne Coons, CHIEF COMPLAINT:  Acute hypoxmeic resp failure - backdrop ILD    History of Present Illness:  81 year old male who I saw for the first time in September 2023 for interstitial lung disease.  At that time his symptoms are chronic cough there was concern of progressive phenotype because he had already had ILD.  He had a family history of COPD.  He never smoked cigarettes but did do some passive smoking.  He was in the [place] and had agent orange exposure.  He worked in [employer].  All this along with age greater than 39 and Caucasian put him at risk for IPF.  However he did have bird exposure at this house for many years.  I saw him back in November 2023.  High-resolution CT chest thoracic radiology opinion more consistent with hypersensitive pneumonitis consistent with his bird exposure..  He has had progression on both CT scan of the chest and pulmonary function test [see below] just prior to Thanksgiving 2023 indicated him to get rid of the birds and consider antifibrotic nintedanib.  However around this time he started calling into the office complaining of some mild onset hemoptysis [currently not an issue] and at this ER visit is reporting 2 to 3-week worsening dyspnea and cough and 2 days of worsening pedal edema.  He had new onset hypoxemia at rest.  Was hypoxemic requiring few liters nasal cannula.  According to the hospitalist he has responded to Lasix.  After arrival he has developed bilateral distal one third erythema on both shin along with warmth and tenderness to touch but no crepitus.  He is on Rocephin and azithromycin.  His BNP, troponin respiratory virus panel urine strep are negative his CT angiogram chest is negative for PE.  His echocardiogram shows good ejection fraction and no evidence of RV strain.  But he is  definitely hypoxemic.  Pulmonary has been consulted.  Of note he is yet to start his antifibrotic.  Past Medical History:  Allergies with Allergic Rhinitis  CAD s/p MI  HTN HLD  Renal Stones  Rotator Cuff Tear LE swelling  Arthritis  Ankylosing Spondylitis   Significant Hospital Events:  12/9 Admit  12/10 PCCM consulted  12/11 O2 weaned to 2L    Subjective:  Pt reports feeling much better, hopeful to go home.  Notes dry cough, feels as though he needs to "get something up".  Reports he has had reflux recently, persistent cough.  Patient reports the bird has been removed from the home  sputum sample on bedside table O2 2L    Objective   Blood pressure 120/64, pulse 73, temperature 97.9 F (36.6 C), temperature source Oral, resp. rate 16, height '5\' 7"'$  (1.702 m), weight 73.8 kg, SpO2 95 %.        Intake/Output Summary (Last 24 hours) at 07/22/2022 0808 Last data filed at 07/22/2022 0600 Gross per 24 hour  Intake 338.63 ml  Output 1400 ml  Net -1061.37 ml   Filed Weights   07/20/22 1857  Weight: 73.8 kg    Examination: General: Well-appearing adult male lying in bed in no acute distress HEENT: MM pink/moist, [place] O2, pupils equal/reactive Neuro: AAOx4, MAE, normal strength CV: s1s2 RRR, no m/r/g PULM: Nonlabored at rest on 2 L, lungs bilaterally anterior and posterior with dry crackles two thirds the  way up GI: soft, bsx4 active  Extremities: warm/dry, 1+ BLE edema  Skin: LLE faint petechial rash on inner left ankle  Resolved Hospital Problem list     Assessment & Plan:   Interstitial lung disease-progressive phenotype: UIP Pattern on radiographic imaging. 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  Differential diagnose of IPF based on age greater than 56, masonry work, Caucasian gender and agent orange exposure. Pending starting antifibrotic nintedanib.  Concern for acute ILD flare as  no evidence of CHF, PE, MI, RVP negative. 3 week clinical worsening.   -change PPI to BID while on high dose steroids  -allergy panel pending / RAST  -change IV solumedrol to prednisone, taper in place -anticipate 3 week taper of steroids 60 mg x7 days, then 40 mg x7 days, then 20 mg x7 days -given high steroid dosing, add prophylactic bactrim for PJP -bird has been removed from home  -check ambulatory O2 needs prior to discharge -consider barium swallow > small hiatal hernia noted on CT chest. Consider laryngeal pharyngeal reflux / aspiration. Doubt he would fail a bedside SLP exam.  -send sputum on bedside table for culture, RN notified  -continue home pulmonary meds at discharge  -defer anti-fibrotics to [name]  -follow up with [name] on 12/18 established   BLE Cellulitis (POA) -abx per [facility]  -note PCT / lactate neg   Best practice:  Per [place] to discharge from pulmonary standpoint once above items in place. PCCM will sign off. Please call back if new needs arise.   Noe Gens, MSN, APRN, NP-C, AGACNP-BC [facility] 07/22/2022, 8:08 AM   Please see [email or web address] for pager details.   From 7A-7P if no response, please call [phone] After hours, please call ELink [phone]

## 2022-07-22 NOTE — Telephone Encounter (Signed)
Called [employer] for update on [employer] prior authorization. Per rep, previous PA was cancelled as clinicals were not submitted.  Phone: [phone] New EOC ID: [id number]  Turnaround time is 24-72 hours.  Knox Saliva, PharmD, MPH, BCPS, CPP Clinical Pharmacist (Rheumatology and Pulmonology)

## 2022-07-22 NOTE — Progress Notes (Signed)
PROGRESS NOTE  DELOIS TOLBERT  GXQ:[id number] DOB: August 30, 1940 DOA: 07/20/2022 PCP: [name], MD   Brief Narrative: Patient is a 46-year male with history of myeloproliferative disorder with Jak mutation, recently diagnosed interstitial lung disease who presented to the ED with complaint of 2 to 3 weeks history of worsening dyspnea, cough, pedal edema.  He was hypoxic on presentation and had to be put on nasal cannula.  CT angiogram negative for PE.  Pulmonary consulted for his history of ILD.  Started on IV steroids.  Assessment & Plan:  Principal Problem:   Acute respiratory failure with hypoxia (HCC) Active Problems:   ILD (interstitial lung disease) ([place])   Essential hypertension   Myeloproliferative disorder (HCC)   Acute exacerbation of interstitial lung disease: Has history of recently diagnosed ILD, follows with pulmonology.  Suspected to be from bird exposure, has cockatiel at home for more than 20 years.  [name]  has been removed.  He was planned to be started on antifibrotic nintedanib as an outpatient by pulmonology. High-resolution CT showed features of UIP. Extensive, acutely superimposed ground-glass and heterogeneous airspace disease, particularly in the left lung base.  IV steroids changed to prednisone which we will continue on tapering dose.  Also started on prophylactic Bactrim for PCP. Sputum culture sent.  He has follow-up with [name] in 12/18.  Respiratory viral panel negative.  Acute hypoxic respiratory failure: Secondary to ILD exacerbation.  He qualified for home oxygen 2 L.  Given few days of Lasix for bilateral lower extreme edema which is resolved.  2D echo showed EF of 62%.  Ultrasound of lower extremity did not show any signs of DVT.  Bilateral lower EXTR cellulitis: On Ceftriaxone.  Procalcitonin, lactic acid nonreassuring.  History of myeloproliferative disorder: Has chronic leukocytosis and thrombocytopenia.  Follows with Dr. [name].  On  Hydrea.  Hypertension: Continue home medications.  Prediabetes with hyperglycemia: CBG this morning in the range of 300.  Most likely exacerbated by steroid.  Last A1c was 5.7 as per 02/07/2022.  Will get a new A1c level.  Continue sliding scale insulin for now.          DVT prophylaxis:enoxaparin (LOVENOX) injection 40 mg Start: 07/21/22 1000     Code Status: Full Code  Family Communication: Wife at bedside  Patient status:Inpatient  Patient is from :home  Anticipated discharge XK:GYJE  Estimated DC date:tomorrow   Consultants: Pulmonary  Procedures:None  Antimicrobials:  Anti-infectives (From admission, onward)    Start     Dose/Rate Route Frequency Ordered Stop   07/22/22 1030  sulfamethoxazole-trimethoprim (BACTRIM DS) 800-160 MG per tablet 1 tablet        1 tablet Oral Once per day on Mon Wed Fri 07/22/22 1012 08/14/22 0959   07/21/22 2200  azithromycin (ZITHROMAX) 500 mg in sodium chloride 0.9 % 250 mL IVPB  Status:  Discontinued        500 mg 250 mL/hr over 60 Minutes Intravenous Every 24 hours 07/20/22 2255 07/21/22 1203   07/21/22 1000  cefTRIAXone (ROCEPHIN) 2 g in sodium chloride 0.9 % 100 mL IVPB        2 g 200 mL/hr over 30 Minutes Intravenous Every 24 hours 07/20/22 2255 07/26/22 0959   07/20/22 2215  cefTRIAXone (ROCEPHIN) 1 g in sodium chloride 0.9 % 100 mL IVPB        1 g 200 mL/hr over 30 Minutes Intravenous  Once 07/20/22 2213 07/20/22 2315   07/20/22 2215  azithromycin (ZITHROMAX) 500 mg  in sodium chloride 0.9 % 250 mL IVPB        500 mg 250 mL/hr over 60 Minutes Intravenous  Once 07/20/22 2213 07/21/22 0054       Subjective: Patient seen and examined at bedside today.  Hemodynamically stable lying in bed.  Comfortable.  On 2 L of oxygen per minute.  Wife at bedside.  Denies any worsening shortness of breath or cough.  We discussed about staying 1 more day in the hospital to trend his white cell count and blood glucose  level.  Objective: Vitals:   07/22/22 0100 07/22/22 0535 07/22/22 0844 07/22/22 1033  BP:  120/64  (!) 144/60  Pulse: 89 73  91  Resp:  16  16  Temp: 98.6 F (37 C) 97.9 F (36.6 C)  (!) 97.5 F (36.4 C)  TempSrc: Oral Oral  Oral  SpO2:  95% 96% 95%  Weight:      Height:        Intake/Output Summary (Last 24 hours) at 07/22/2022 1130 Last data filed at 07/22/2022 0900 Gross per 24 hour  Intake 580 ml  Output 1400 ml  Net -820 ml   Filed Weights   07/20/22 1857  Weight: 73.8 kg    Examination:  General exam: Overall comfortable, not in distress HEENT: PERRL Respiratory system:  no wheezes or crackles  Cardiovascular system: S1 & S2 heard, RRR.  Gastrointestinal system: Abdomen is nondistended, soft and nontender. Central nervous system: Alert and oriented Extremities: No edema, no clubbing ,no cyanosis Skin: No rashes, no ulcers,no icterus     Data Reviewed: I have personally reviewed following labs and imaging studies  CBC: Recent Labs  Lab 07/17/22 0759 07/20/22 1903 07/21/22 0715 07/22/22 0433  WBC 17.8* 21.1* 16.6* 25.2*  NEUTROABS 13.0* 17.7*  --   --   HGB 11.9* 11.2* 10.1* 10.9*  HCT 38.2* 37.0* 33.3* 34.6*  MCV 94.6 96.1 94.1 94.8  PLT 201 209 175 161   Basic Metabolic Panel: Recent Labs  Lab 07/17/22 0759 07/20/22 1903 07/21/22 0715 07/22/22 0433  NA 140 138 139 141  K 4.1 4.0 4.1 3.9  CL 103 102 99 102  CO2 '31 26 29 26  '$ GLUCOSE 108* 116* 99 171*  BUN '14 16 13 '$ 24*  CREATININE 0.78 0.83 0.84 1.04  CALCIUM 10.0 9.5 8.9 9.3  MG  --   --   --  2.1  PHOS  --   --   --  4.8*     Recent Results (from the past 240 hour(s))  Resp panel by RT-PCR (RSV, Flu A&B, Covid) Anterior Nasal Swab     Status: None   Collection Time: 07/20/22  6:57 PM   Specimen: Anterior Nasal Swab  Result Value Ref Range Status   SARS Coronavirus 2 by RT PCR NEGATIVE NEGATIVE Final    Comment: (NOTE) SARS-CoV-2 target nucleic acids are NOT DETECTED.  The  SARS-CoV-2 RNA is generally detectable in upper respiratory specimens during the acute phase of infection. The lowest concentration of SARS-CoV-2 viral copies this assay can detect is 138 copies/mL. A negative result does not preclude SARS-Cov-2 infection and should not be used as the sole basis for treatment or other patient management decisions. A negative result may occur with  improper specimen collection/handling, submission of specimen other than nasopharyngeal swab, presence of viral mutation(s) within the areas targeted by this assay, and inadequate number of viral copies(<138 copies/mL). A negative result must be combined with clinical observations, patient history,  and epidemiological information. The expected result is Negative.  Fact Sheet for Patients:  EntrepreneurPulse.com.au  Fact Sheet for Healthcare Providers:  IncredibleEmployment.be  This test is no t yet approved or cleared by the [place] FDA and  has been authorized for detection and/or diagnosis of SARS-CoV-2 by FDA under an Emergency Use Authorization (EUA). This EUA will remain  in effect (meaning this test can be used) for the duration of the COVID-19 declaration under Section 564(b)(1) of the Act, 21 U.S.C.section 360bbb-3(b)(1), unless the authorization is terminated  or revoked sooner.       Influenza A by PCR NEGATIVE NEGATIVE Final   Influenza B by PCR NEGATIVE NEGATIVE Final    Comment: (NOTE) The Xpert Xpress SARS-CoV-2/FLU/RSV plus assay is intended as an aid in the diagnosis of influenza from Nasopharyngeal swab specimens and should not be used as a sole basis for treatment. Nasal washings and aspirates are unacceptable for Xpert Xpress SARS-CoV-2/FLU/RSV testing.  Fact Sheet for Patients: EntrepreneurPulse.com.au  Fact Sheet for Healthcare Providers: IncredibleEmployment.be  This test is not yet approved or  cleared by the [place] FDA and has been authorized for detection and/or diagnosis of SARS-CoV-2 by FDA under an Emergency Use Authorization (EUA). This EUA will remain in effect (meaning this test can be used) for the duration of the COVID-19 declaration under Section 564(b)(1) of the Act, 21 U.S.C. section 360bbb-3(b)(1), unless the authorization is terminated or revoked.     Resp Syncytial Virus by PCR NEGATIVE NEGATIVE Final    Comment: (NOTE) Fact Sheet for Patients: EntrepreneurPulse.com.au  Fact Sheet for Healthcare Providers: IncredibleEmployment.be  This test is not yet approved or cleared by the [place] FDA and has been authorized for detection and/or diagnosis of SARS-CoV-2 by FDA under an Emergency Use Authorization (EUA). This EUA will remain in effect (meaning this test can be used) for the duration of the COVID-19 declaration under Section 564(b)(1) of the Act, 21 U.S.C. section 360bbb-3(b)(1), unless the authorization is terminated or revoked.  Performed at [facility], [place] [address] [place], [place] [address]   Respiratory (~20 pathogens) panel by PCR     Status: None   Collection Time: 07/20/22  6:57 PM   Specimen: Nasopharyngeal Swab; Respiratory  Result Value Ref Range Status   Adenovirus NOT DETECTED NOT DETECTED Final   Coronavirus 229E NOT DETECTED NOT DETECTED Final    Comment: (NOTE) The Coronavirus on the Respiratory Panel, DOES NOT test for the novel  Coronavirus (2019 nCoV)    Coronavirus HKU1 NOT DETECTED NOT DETECTED Final   Coronavirus NL63 NOT DETECTED NOT DETECTED Final   Coronavirus OC43 NOT DETECTED NOT DETECTED Final   Metapneumovirus NOT DETECTED NOT DETECTED Final   Rhinovirus / Enterovirus NOT DETECTED NOT DETECTED Final   Influenza A NOT DETECTED NOT DETECTED Final   Influenza B NOT DETECTED NOT DETECTED Final   Parainfluenza Virus 1 NOT DETECTED NOT DETECTED Final    Parainfluenza Virus 2 NOT DETECTED NOT DETECTED Final   Parainfluenza Virus 3 NOT DETECTED NOT DETECTED Final   Parainfluenza Virus 4 NOT DETECTED NOT DETECTED Final   Respiratory Syncytial Virus NOT DETECTED NOT DETECTED Final   Bordetella pertussis NOT DETECTED NOT DETECTED Final   Bordetella Parapertussis NOT DETECTED NOT DETECTED Final   Chlamydophila pneumoniae NOT DETECTED NOT DETECTED Final   Mycoplasma pneumoniae NOT DETECTED NOT DETECTED Final    Comment: Performed at [facility], [place]. [address], [place], [place] [address]  Radiology Studies: CT Chest High Resolution  Result Date: 07/21/2022 CLINICAL DATA:  Diffuse interstitial lung disease. EXAM: CT CHEST WITHOUT CONTRAST TECHNIQUE: Multidetector CT imaging of the chest was performed following the standard protocol without intravenous contrast. High resolution imaging of the lungs, as well as inspiratory and expiratory imaging, was performed. RADIATION DOSE REDUCTION: This exam was performed according to the departmental dose-optimization program which includes automated exposure control, adjustment of the mA and/or kV according to patient size and/or use of iterative reconstruction technique. COMPARISON:  07/20/2022, 05/04/2022 FINDINGS: Cardiovascular: Aortic atherosclerosis. Normal heart size. Three-vessel coronary artery calcifications and/or stents. No pericardial effusion. Mediastinum/Nodes: Unchanged prominent mediastinal lymph nodes. Small hiatal hernia. Thyroid gland, trachea, and esophagus demonstrate no significant findings. Lungs/Pleura: Low volume examination with severe elevation of the right hemidiaphragm as well as associated bandlike scarring or atelectasis. Extensive, acutely superimposed ground-glass and heterogeneous airspace disease, particularly in the left lung base (series 4, image 108). Underlying pulmonary fibrosis characterized by recent LD protocol examination dated 05/04/2022 is not appreciably changed,  although assessment is substantially limited in the setting of acute airspace disease. No significant air trapping on expiratory phase imaging. Upper Abdomen: No acute abnormality. Colon anterior to the liver dome in the included upper abdomen (series 3, image 83). Musculoskeletal: No chest wall abnormality. No acute osseous findings. IMPRESSION: 1. Low volume examination with severe elevation of the right hemidiaphragm as well as associated bandlike scarring or atelectasis of the right lung base. 2. Extensive, acutely superimposed ground-glass and heterogeneous airspace disease, particularly in the left lung base. Findings are consistent with multifocal infection or aspiration. 3. Underlying pulmonary fibrosis characterized by recent ILD protocol examination dated 05/04/2022 is not appreciably changed, although assessment is substantially limited in the setting of acute airspace disease. Fibrosis was previously characterized as UIP pattern. 4. Coronary artery disease. Aortic Atherosclerosis (ICD10-I70.0). Electronically Signed   By: Delanna Ahmadi M.D.   On: 07/21/2022 14:36   VAS [place] LOWER EXTREMITY VENOUS (DVT) (7a-7p)  Result Date: 07/21/2022  Lower Venous DVT Study Patient Name:  [name]  Date of Exam:   07/21/2022 Medical Rec #: [id number]        Accession #:    [id number] Date of Birth: [birth year]       Patient Gender: M Patient Age:   81 years Exam Location:  [facility] Procedure:      VAS [place] LOWER EXTREMITY VENOUS (DVT) Referring Phys: COOPER ROBBINS --------------------------------------------------------------------------------  Indications: Pain, Swelling, Erythema, and SOB.  Comparison Study: Prior negative right LEV done 10/16/15 Performing Technologist: Sharion Dove RVS  Examination Guidelines: A complete evaluation includes B-mode imaging, spectral Doppler, color Doppler, and power Doppler as needed of all accessible portions of each vessel. Bilateral testing is considered an  integral part of a complete examination. Limited examinations for reoccurring indications may be performed as noted. The reflux portion of the exam is performed with the patient in reverse Trendelenburg.  +---------+---------------+---------+-----------+----------+--------------+ RIGHT    CompressibilityPhasicitySpontaneityPropertiesThrombus Aging +---------+---------------+---------+-----------+----------+--------------+ CFV      Full           Yes      Yes                                 +---------+---------------+---------+-----------+----------+--------------+ SFJ      Full                                                        +---------+---------------+---------+-----------+----------+--------------+  FV Prox  Full                                                        +---------+---------------+---------+-----------+----------+--------------+ FV Mid   Full                                                        +---------+---------------+---------+-----------+----------+--------------+ FV DistalFull                                                        +---------+---------------+---------+-----------+----------+--------------+ PFV      Full                                                        +---------+---------------+---------+-----------+----------+--------------+ POP      Full           Yes      Yes                                 +---------+---------------+---------+-----------+----------+--------------+ PTV      Full                                                        +---------+---------------+---------+-----------+----------+--------------+ PERO     Full                                                        +---------+---------------+---------+-----------+----------+--------------+   +---------+---------------+---------+-----------+----------+--------------+ LEFT     CompressibilityPhasicitySpontaneityPropertiesThrombus  Aging +---------+---------------+---------+-----------+----------+--------------+ CFV      Full           Yes      Yes                                 +---------+---------------+---------+-----------+----------+--------------+ SFJ      Full                                                        +---------+---------------+---------+-----------+----------+--------------+ FV Prox  Full                                                        +---------+---------------+---------+-----------+----------+--------------+  FV Mid   Full                                                        +---------+---------------+---------+-----------+----------+--------------+ FV DistalFull                                                        +---------+---------------+---------+-----------+----------+--------------+ PFV      Full                                                        +---------+---------------+---------+-----------+----------+--------------+ POP      Full           Yes      Yes                                 +---------+---------------+---------+-----------+----------+--------------+ PTV      Full                                                        +---------+---------------+---------+-----------+----------+--------------+ PERO     Full                                                        +---------+---------------+---------+-----------+----------+--------------+     Summary: BILATERAL: - No evidence of deep vein thrombosis seen in the lower extremities, bilaterally. -No evidence of popliteal cyst, bilaterally. RIGHT: - Ultrasound characteristics of enlarged lymph nodes are noted in the groin.  LEFT: - Ultrasound characteristics of enlarged lymph nodes noted in the groin.  *See table(s) above for measurements and observations.    Preliminary    ECHOCARDIOGRAM COMPLETE  Result Date: 07/21/2022    ECHOCARDIOGRAM REPORT   Patient Name:   TALEN POSER Date of Exam: 07/21/2022 Medical Rec #:  [id number]       Height:       67.0 in Accession #:    [id number]      Weight:       162.7 lb Date of Birth:  26-Jun-1941      BSA:          1.852 m Patient Age:    70 years        BP:           114/51 mmHg Patient Gender: M               HR:           67 bpm. Exam Location:  Inpatient Procedure: 2D Echo, Cardiac Doppler and Color Doppler Indications:    Pulmonary hypertension I27.2  History:  Patient has prior history of Echocardiogram examinations, most                 recent 05/01/2022. CAD and Angina, COPD; Risk                 Factors:Hypertension, Sleep Apnea and Dyslipidemia.  Sonographer:    Ronny Flurry Referring Phys: [facility] IMPRESSIONS  1. Left ventricular ejection fraction, by estimation, is 60 to 65%. The left ventricle has normal function. The left ventricle has no regional wall motion abnormalities. There is mild left ventricular hypertrophy of the basal-septal segment. Left ventricular diastolic parameters are indeterminate.  2. Right ventricular systolic function is normal. The right ventricular size is normal. Tricuspid regurgitation signal is inadequate for assessing PA pressure.  3. The mitral valve is normal in structure. Trivial mitral valve regurgitation. No evidence of mitral stenosis.  4. The aortic valve is tricuspid. Aortic valve regurgitation is not visualized. Aortic valve sclerosis/calcification is present, without any evidence of aortic stenosis. Aortic valve area, by VTI measures 2.47 cm. Aortic valve mean gradient measures 6.0 mmHg. Aortic valve Vmax measures 1.63 m/s.  5. The inferior vena cava is normal in size with greater than 50% respiratory variability, suggesting right atrial pressure of 3 mmHg. FINDINGS  Left Ventricle: Left ventricular ejection fraction, by estimation, is 60 to 65%. The left ventricle has normal function. The left ventricle has no regional wall motion abnormalities. The left ventricular  internal cavity size was normal in size. There is  mild left ventricular hypertrophy of the basal-septal segment. Left ventricular diastolic parameters are indeterminate. Normal left ventricular filling pressure. Right Ventricle: The right ventricular size is normal. No increase in right ventricular wall thickness. Right ventricular systolic function is normal. Tricuspid regurgitation signal is inadequate for assessing PA pressure. Left Atrium: Left atrial size was normal in size. Right Atrium: Right atrial size was normal in size. Pericardium: There is no evidence of pericardial effusion. Mitral Valve: The mitral valve is normal in structure. Trivial mitral valve regurgitation. No evidence of mitral valve stenosis. Tricuspid Valve: The tricuspid valve is normal in structure. Tricuspid valve regurgitation is not demonstrated. No evidence of tricuspid stenosis. Aortic Valve: The aortic valve is tricuspid. Aortic valve regurgitation is not visualized. Aortic valve sclerosis/calcification is present, without any evidence of aortic stenosis. Aortic valve mean gradient measures 6.0 mmHg. Aortic valve peak gradient measures 10.6 mmHg. Aortic valve area, by VTI measures 2.47 cm. Pulmonic Valve: The pulmonic valve was normal in structure. Pulmonic valve regurgitation is mild. No evidence of pulmonic stenosis. Aorta: The aortic root is normal in size and structure. Venous: The inferior vena cava is normal in size with greater than 50% respiratory variability, suggesting right atrial pressure of 3 mmHg. IAS/Shunts: No atrial level shunt detected by color flow Doppler.  LEFT VENTRICLE PLAX 2D LVIDd:         5.00 cm   Diastology LVIDs:         3.20 cm   LV e' medial:    5.66 cm/s LV PW:         1.10 cm   LV E/e' medial:  15.7 LV IVS:        1.20 cm   LV e' lateral:   7.62 cm/s LVOT diam:     2.10 cm   LV E/e' lateral: 11.7 LV SV:         82 LV SV Index:   45 LVOT Area:     3.46 cm  RIGHT VENTRICLE             IVC RV Basal  diam:  3.00 cm     IVC diam: 1.20 cm RV S prime:     16.10 cm/s RVOT diam:      3.00 cm TAPSE (M-mode): 1.6 cm LEFT ATRIUM             Index        RIGHT ATRIUM          Index LA diam:        4.30 cm 2.32 cm/m   RA Area:     9.85 cm LA Vol (A2C):   51.4 ml 27.75 ml/m  RA Volume:   20.40 ml 11.01 ml/m LA Vol (A4C):   43.6 ml 23.54 ml/m LA Biplane Vol: 51.7 ml 27.91 ml/m  AORTIC VALVE AV Area (Vmax):    2.74 cm AV Area (Vmean):   2.54 cm AV Area (VTI):     2.47 cm AV Vmax:           163.00 cm/s AV Vmean:          108.000 cm/s AV VTI:            0.334 m AV Peak Grad:      10.6 mmHg AV Mean Grad:      6.0 mmHg LVOT Vmax:         129.00 cm/s LVOT Vmean:        79.100 cm/s LVOT VTI:          0.238 m LVOT/AV VTI ratio: 0.71  AORTA Ao Root diam: 3.20 cm Ao Asc diam:  3.10 cm MITRAL VALVE MV Area (PHT): 3.85 cm    SHUNTS MV Decel Time: 197 msec    Systemic VTI:  0.24 m MV E velocity: 89.10 cm/s  Systemic Diam: 2.10 cm MV A velocity: 92.80 cm/s  Pulmonic Diam: 3.00 cm MV E/A ratio:  0.96 Fransico Him MD Electronically signed by Fransico Him MD Signature Date/Time: 07/21/2022/10:38:18 AM    Final    CT Angio Chest PE W and/or Wo Contrast  Result Date: 07/20/2022 CLINICAL DATA:  Patient having dry cough and shortness of breath for 3-4 days. Patient at swelling in the legs and feet in past couple of days. EXAM: CT ANGIOGRAPHY CHEST WITH CONTRAST TECHNIQUE: Multidetector CT imaging of the chest was performed using the standard protocol during bolus administration of intravenous contrast. Multiplanar CT image reconstructions and MIPs were obtained to evaluate the vascular anatomy. RADIATION DOSE REDUCTION: This exam was performed according to the departmental dose-optimization program which includes automated exposure control, adjustment of the mA and/or kV according to patient size and/or use of iterative reconstruction technique. CONTRAST:  77m OMNIPAQUE IOHEXOL 350 MG/ML SOLN COMPARISON:  CT examination dated  May 04, 2022 FINDINGS: Cardiovascular: Satisfactory opacification of the pulmonary arteries to the segmental level. No evidence of pulmonary embolism. Normal heart size. No pericardial effusion. Prominent coronary artery atherosclerotic calcifications. Main pulmonary trunk is dilated measuring up to 3.2 cm concerning for pulmonary arterial hypertension. Mediastinum/Nodes: No enlarged mediastinal, hilar, or axillary lymph nodes. Thyroid gland, trachea, and esophagus demonstrate no significant findings. Lungs/Pleura: Prominence of the central pulmonary vessels as well as mild bilateral perihilar and lower lobe ground-glass opacities. Findings may be secondary to infectious/inflammatory process or mild pulmonary edema in the background of chronic interstitial lung disease with mild bronchiectasis and scarring. No large pleural effusion. Chronic elevation of the right hemidiaphragm, unchanged. Upper Abdomen: No acute  abnormality. 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 Review of the MIP images confirms the above findings. IMPRESSION: 1. No evidence of pulmonary embolism. 2. Prominence of the central pulmonary vessels as well as mild bilateral perihilar and lower lobe ground-glass opacities. Findings may be secondary to mild pulmonary edema or infectious/inflammatory process in the background of chronic interstitial lung disease. No large pleural effusion. 3. Prominent coronary artery atherosclerotic calcifications. 4. Main pulmonary trunk is dilated measuring up to 3.2 cm concerning for pulmonary arterial hypertension. Electronically Signed   By: Keane Police D.O.   On: 07/20/2022 22:09   [name] 2 View  Result Date: 07/20/2022 CLINICAL DATA:  Cough, COPD EXAM: CHEST - 2 VIEW COMPARISON:  03/11/2022 FINDINGS: Frontal and lateral views of the chest demonstrate a stable cardiac silhouette. Chronic elevation of the right hemidiaphragm.  Stable bibasilar fibrosis and bronchiectasis. No acute airspace disease, effusion, or pneumothorax. IMPRESSION: 1. Stable bibasilar fibrosis and bronchiectasis. 2. No acute airspace disease. 3. Chronic elevation right hemidiaphragm. Electronically Signed   By: Randa Ngo M.D.   On: 07/20/2022 17:35    Scheduled Meds:  amLODipine  2.5 mg Oral Daily   aspirin EC  81 mg Oral Daily   enoxaparin (LOVENOX) injection  40 mg Subcutaneous Q24H   guaiFENesin  1,200 mg Oral BID   hydroxyurea  500 mg Oral QHS   insulin aspart  0-15 Units Subcutaneous TID WC   insulin aspart  0-5 Units Subcutaneous QHS   mometasone-formoterol  2 puff Inhalation BID   pantoprazole  40 mg Oral BID   [START ON 07/23/2022] predniSONE  60 mg Oral Q breakfast   Followed by   Derrill Memo ON 07/30/2022] predniSONE  40 mg Oral Q breakfast   Followed by   Derrill Memo ON 08/06/2022] predniSONE  20 mg Oral Q breakfast   simvastatin  20 mg Oral Daily   sulfamethoxazole-trimethoprim  1 tablet Oral Once per day on Mon Wed Fri   Continuous Infusions:  cefTRIAXone (ROCEPHIN)  IV 2 g (07/22/22 1104)     LOS: 1 day   Shelly Coss, MD [facility] P12/06/2022, 11:30 AM

## 2022-07-22 NOTE — Progress Notes (Signed)
SATURATION QUALIFICATIONS: (This note is used to comply with regulatory documentation for home oxygen)  Patient Saturations on Room Air at Rest = 91%  Patient Saturations on Room Air while Ambulating = 84%  Patient Saturations on 2 Liters of oxygen while Ambulating = 92%  Please briefly explain why patient needs home oxygen: Patient desaturating to 84% on RA.  Agree with above

## 2022-07-23 ENCOUNTER — Other Ambulatory Visit (HOSPITAL_COMMUNITY): Payer: Self-pay

## 2022-07-23 ENCOUNTER — Telehealth: Payer: Self-pay | Admitting: Pharmacist

## 2022-07-23 LAB — CBC
HCT: 35.8 % — ABNORMAL LOW (ref 39.0–52.0)
Hemoglobin: 10.8 g/dL — ABNORMAL LOW (ref 13.0–17.0)
MCH: 28.9 pg (ref 26.0–34.0)
MCHC: 30.2 g/dL (ref 30.0–36.0)
MCV: 95.7 fL (ref 80.0–100.0)
Platelets: 234 10*3/uL (ref 150–400)
RBC: 3.74 MIL/uL — ABNORMAL LOW (ref 4.22–5.81)
RDW: 25.5 % — ABNORMAL HIGH (ref 11.5–15.5)
WBC: 36.2 10*3/uL — ABNORMAL HIGH (ref 4.0–10.5)
nRBC: 0.1 % (ref 0.0–0.2)

## 2022-07-23 LAB — GLUCOSE, CAPILLARY
Glucose-Capillary: 96 mg/dL (ref 70–99)
Glucose-Capillary: 131 mg/dL — ABNORMAL HIGH (ref 70–99)

## 2022-07-23 LAB — LEGIONELLA PNEUMOPHILA SEROGP 1 UR AG: L. pneumophila Serogp 1 Ur Ag: NEGATIVE

## 2022-07-23 MED ORDER — AMOXICILLIN-POT CLAVULANATE 875-125 MG PO TABS: 1.0000 | ORAL_TABLET | Freq: Two times a day (BID) | ORAL | 0 refills | Status: AC

## 2022-07-23 MED ORDER — SULFAMETHOXAZOLE-TRIMETHOPRIM 800-160 MG PO TABS: 1.0000 | ORAL_TABLET | ORAL | 0 refills | Status: DC

## 2022-07-23 MED ORDER — AMOXICILLIN-POT CLAVULANATE 875-125 MG PO TABS: 1.0000 | ORAL_TABLET | Freq: Two times a day (BID) | ORAL | Status: DC

## 2022-07-23 MED ORDER — PREDNISONE 20 MG PO TABS: 20.0000 mg | ORAL_TABLET | Freq: Every day | ORAL | 0 refills | Status: AC

## 2022-07-23 MED ORDER — GUAIFENESIN ER 600 MG PO TB12: 1200.0000 mg | ORAL_TABLET | Freq: Two times a day (BID) | ORAL | 0 refills | Status: AC

## 2022-07-23 MED ORDER — PANTOPRAZOLE SODIUM 40 MG PO TBEC: 40.0000 mg | DELAYED_RELEASE_TABLET | Freq: Two times a day (BID) | ORAL | 0 refills | Status: AC

## 2022-07-23 MED ORDER — PREDNISONE 20 MG PO TABS: 40.0000 mg | ORAL_TABLET | Freq: Every day | ORAL | 0 refills | Status: AC

## 2022-07-23 MED ORDER — PREDNISONE 20 MG PO TABS: 60.0000 mg | ORAL_TABLET | Freq: Every day | ORAL | 0 refills | Status: AC

## 2022-07-23 NOTE — TOC Transition Note (Signed)
Transition of Care [facility]) - CM/SW Discharge Note   Patient Details  Name: [name] MRN: [id number] Date of Birth: 05-Jun-1941  Transition of Care [facility]) CM/SW Contact:  Tom-Johnson, Renea Ee, RN Phone Number: 07/23/2022, 12:21 PM   Clinical Narrative:     Patient is scheduled for discharge today. Home O2 ordered from [employer] per patient's choice from Medicare.gov list and Erasmo Downer to deliver portable tank to patient at bedside prior discharge. Family to transport at discharge. No further TOC needs noted.     Final next level of care: Home/Self Care Barriers to Discharge: Barriers Resolved   Patient Goals and CMS Choice Patient states their goals for this hospitalization and ongoing recovery are:: To return home CMS Medicare.gov Compare Post Acute Care list provided to:: Patient Choice offered to / list presented to : Patient  Discharge Placement                Patient to be transferred to facility by: Family      Discharge Plan and Services                DME Arranged: Oxygen DME Agency: [employer] Date DME Agency Contacted: 07/23/22 Time DME Agency Contacted: [id number] Representative spoke with at DME Agency: Erasmo Downer HH Arranged: NA [place] Agency: NA        Social Determinants of Health ([place]) Interventions Transportation Interventions: Inpatient TOC, Intervention Not Indicated, Patient Resources (Friends/Family)   Readmission Risk Interventions     No data to display

## 2022-07-23 NOTE — Discharge Summary (Signed)
Physician Discharge Summary  [name] LFY:[id number] DOB: [birth year] DOA: 07/20/2022  PCP: [name] Agreste, MD  Admit date: 07/20/2022 Discharge date: 07/23/2022  Admitted From: Home Disposition:  Home  Discharge Condition:Stable CODE STATUS:FULL Diet recommendation: Heart Healthy   Brief/Interim Summary: Patient is a 81-year male with history of myeloproliferative disorder with Jak mutation, recently diagnosed interstitial lung disease who presented to the ED with complaint of 2 to 3 weeks history of worsening dyspnea, cough, pedal edema.  He was hypoxic on presentation and had to be put on nasal cannula.  CT angiogram negative for PE.  Pulmonary consulted for his history of ILD.  Started on IV steroids.  Respiratory status is improved.  He qualified for home oxygen for 2 L/min.  Pulmonology cleared for discharge and he has an follow-up appointment on 12/18.  Medically stable for discharge today with a tapering dose of oral steroid  Following problems were addressed during his hospitalization:  Acute exacerbation of interstitial lung disease: Has history of recently diagnosed ILD, follows with pulmonology.  Suspected to be from bird exposure, has cockatiel at home for more than 20 years.  [name]  has been removed.  He was planned to be started on antifibrotic nintedanib as an outpatient by pulmonology. High-resolution CT showed features of UIP. Extensive, acutely superimposed ground-glass and heterogeneous airspace disease, particularly in the left lung base.  IV steroids changed to prednisone which we will continue on tapering dose.  Also started on prophylactic Bactrim for PCP. Sputum culture sent: showing mixed flora.  He has follow-up with Dr. Chase Jordan in 12/18.  Respiratory viral panel negative.   Acute hypoxic respiratory failure: Secondary to ILD exacerbation.  He qualified for home oxygen 2 L.  Given few days of Lasix for bilateral lower extreme edema which is resolved.  2D echo  showed EF of 62%.  Ultrasound of lower extremity did not show any signs of DVT.   Bilateral lower EXTR cellulitis: Started  Ceftriaxone.  Procalcitonin, lactic acid nonreassuring.  Cellulitis looks much better today.  Antibiotics changed to oral   History of myeloproliferative disorder/leukocytosis: Has chronic leukocytosis and thrombocytopenia.  Follows with Dr. Julien Jordan.  On Hydrea. His leukocytosis worsened to the range of 36,000 today.  I discussed this with Dr. Julien Jordan who thinks that this most likely contributor from steroids.  He has an follow-up appointment with Dr. Julien Jordan few weeks.  I recommend to follow-up with PCP or his pulmonologist as an outpatient to check CBC in a week   Hypertension: Continue home medications.    Discharge Diagnoses:  Principal Problem:   Acute respiratory failure with hypoxia ([place]) Active Problems:   ILD (interstitial lung disease) ([place])   Essential hypertension   Myeloproliferative disorder [facility])    Discharge Instructions  Discharge Instructions     Diet - low sodium heart healthy   Complete by: As directed    Discharge instructions   Complete by: As directed    1)Please take prescribed medications as instructed 2)Follow up with your pulmonologist on 12/18.  Do a CBC test to check your white cell count during the follow-up 3)Follow up with your PCP and oncologist   Increase activity slowly   Complete by: As directed       Allergies as of 07/23/2022       Reactions   Celebrex [celecoxib]    Pt states precipitated his heart attack.   Tramadol Other (See Comments)   Makes patient jumpy        Medication  List     TAKE these medications    acetaminophen 325 MG tablet Commonly known as: TYLENOL Take 650 mg by mouth every 6 (six) hours as needed for mild pain or moderate pain.   amLODipine 2.5 MG tablet Commonly known as: NORVASC Take 1 tablet (2.5 mg total) by mouth daily.   amoxicillin-clavulanate 875-125 MG tablet Commonly  known as: AUGMENTIN Take 1 tablet by mouth every 12 (twelve) hours for 3 days. Start taking on: July 24, 2022   aspirin EC 81 MG tablet Take 81 mg by mouth daily.   budesonide-formoterol 80-4.5 MCG/ACT inhaler Commonly known as: SYMBICORT Inhale 2 puffs into the lungs 2 (two) times daily. What changed:  when to take this reasons to take this   guaiFENesin 600 MG 12 hr tablet Commonly known as: MUCINEX Take 2 tablets (1,200 mg total) by mouth 2 (two) times daily for 7 days.   hydroxyurea 500 MG capsule Commonly known as: HYDREA Take 1 capsule (500 mg total) by mouth daily. May take with food to minimize GI side effects. What changed: additional instructions   ICaps Areds 2 Caps Take by mouth daily.   pantoprazole 40 MG tablet Commonly known as: PROTONIX Take 1 tablet (40 mg total) by mouth 2 (two) times daily. What changed: See the new instructions.   predniSONE 20 MG tablet Commonly known as: DELTASONE Take 3 tablets (60 mg total) by mouth daily with breakfast for 6 days. Start taking on: July 24, 2022   predniSONE 20 MG tablet Commonly known as: DELTASONE Take 2 tablets (40 mg total) by mouth daily with breakfast for 7 days. Start taking on: July 30, 2022   predniSONE 20 MG tablet Commonly known as: DELTASONE Take 1 tablet (20 mg total) by mouth daily with breakfast for 7 days. Start taking on: August 06, 2022   PREVAGEN PO Take 1 tablet by mouth daily.   prochlorperazine 10 MG tablet Commonly known as: COMPAZINE Take 1 tablet (10 mg total) by mouth every 6 (six) hours as needed for nausea or vomiting.   simvastatin 20 MG tablet Commonly known as: ZOCOR TAKE 1 TABLET EVERY DAY WITH BREAKFAST (APPOINTMENT IS NEEDED FOR REFILLS) What changed:  how much to take how to take this when to take this additional instructions   sulfamethoxazole-trimethoprim 800-160 MG tablet Commonly known as: BACTRIM DS Take 1 tablet by mouth 3 (three) times a  week for 23 days. Start taking on: July 24, 2022   UNABLE TO FIND Med Name: CPAP per Dr [name]   valACYclovir 500 MG tablet Commonly known as: Valtrex Take 1 tablet (500 mg total) by mouth 2 (two) times daily. Take at 1st sign of penile rash.   Vitamin D3 1.25 MG (50000 UT) Caps Take 1 capsule by mouth daily. Pt does not know dose.               Durable Medical Equipment  (From admission, onward)           Start     Ordered   07/22/22 1201  For home use only DME oxygen  Once       Question Answer Comment  Length of Need Lifetime   Mode or (Route) Nasal cannula   Liters per Minute 2   Frequency Continuous (stationary and portable oxygen unit needed)   Oxygen delivery system Gas      07/22/22 1201            Follow-up Information  Brand Males, MD Follow up on 07/29/2022.   Specialty: Pulmonary Disease Why: Appt at 4:30 PM. Please arrive at 4:15 for check in. Contact information: [address] [place] [address] [phone]         [name] Agreste, MD. Schedule an appointment as soon as possible for a visit in 1 week(s).   Specialties: Family Medicine, Sports Medicine Contact information: [place] [address] [phone]                Allergies  Allergen Reactions   Celebrex [Celecoxib]     Pt states precipitated his heart attack.   Tramadol Other (See Comments)    Makes patient jumpy    Consultations: PCCM   Procedures/Studies: VAS [place] LOWER EXTREMITY VENOUS (DVT) (7a-7p)  Result Date: 07/22/2022  Lower Venous DVT Study Patient Name:  [name]  Date of Exam:   07/21/2022 Medical Rec #: [id number]        Accession #:    [id number] Date of Birth: 1941-01-23       Patient Gender: M Patient Age:   76 years Exam Location:  [facility] Procedure:      VAS [place] LOWER EXTREMITY VENOUS (DVT) Referring Phys: COOPER ROBBINS  --------------------------------------------------------------------------------  Indications: Pain, Swelling, Erythema, and SOB.  Comparison Study: Prior negative right LEV done 10/16/15 Performing Technologist: Sharion Dove RVS  Examination Guidelines: A complete evaluation includes B-mode imaging, spectral Doppler, color Doppler, and power Doppler as needed of all accessible portions of each vessel. Bilateral testing is considered an integral part of a complete examination. Limited examinations for reoccurring indications may be performed as noted. The reflux portion of the exam is performed with the patient in reverse Trendelenburg.  +---------+---------------+---------+-----------+----------+--------------+ RIGHT    CompressibilityPhasicitySpontaneityPropertiesThrombus Aging +---------+---------------+---------+-----------+----------+--------------+ CFV      Full           Yes      Yes                                 +---------+---------------+---------+-----------+----------+--------------+ SFJ      Full                                                        +---------+---------------+---------+-----------+----------+--------------+ FV Prox  Full                                                        +---------+---------------+---------+-----------+----------+--------------+ FV Mid   Full                                                        +---------+---------------+---------+-----------+----------+--------------+ FV DistalFull                                                        +---------+---------------+---------+-----------+----------+--------------+  PFV      Full                                                        +---------+---------------+---------+-----------+----------+--------------+ POP      Full           Yes      Yes                                 +---------+---------------+---------+-----------+----------+--------------+ PTV       Full                                                        +---------+---------------+---------+-----------+----------+--------------+ PERO     Full                                                        +---------+---------------+---------+-----------+----------+--------------+   +---------+---------------+---------+-----------+----------+--------------+ LEFT     CompressibilityPhasicitySpontaneityPropertiesThrombus Aging +---------+---------------+---------+-----------+----------+--------------+ CFV      Full           Yes      Yes                                 +---------+---------------+---------+-----------+----------+--------------+ SFJ      Full                                                        +---------+---------------+---------+-----------+----------+--------------+ FV Prox  Full                                                        +---------+---------------+---------+-----------+----------+--------------+ FV Mid   Full                                                        +---------+---------------+---------+-----------+----------+--------------+ FV DistalFull                                                        +---------+---------------+---------+-----------+----------+--------------+ PFV      Full                                                        +---------+---------------+---------+-----------+----------+--------------+  POP      Full           Yes      Yes                                 +---------+---------------+---------+-----------+----------+--------------+ PTV      Full                                                        +---------+---------------+---------+-----------+----------+--------------+ PERO     Full                                                        +---------+---------------+---------+-----------+----------+--------------+     Summary: BILATERAL: - No evidence of deep vein  thrombosis seen in the lower extremities, bilaterally. -No evidence of popliteal cyst, bilaterally. RIGHT: - Ultrasound characteristics of enlarged lymph nodes are noted in the groin.  LEFT: - Ultrasound characteristics of enlarged lymph nodes noted in the groin.  *See table(s) above for measurements and observations. Electronically signed by Monica Martinez MD on 07/22/2022 at 1:47:04 PM.    Final    CT Chest High Resolution  Result Date: 07/21/2022 CLINICAL DATA:  Diffuse interstitial lung disease. EXAM: CT CHEST WITHOUT CONTRAST TECHNIQUE: Multidetector CT imaging of the chest was performed following the standard protocol without intravenous contrast. High resolution imaging of the lungs, as well as inspiratory and expiratory imaging, was performed. RADIATION DOSE REDUCTION: This exam was performed according to the departmental dose-optimization program which includes automated exposure control, adjustment of the mA and/or kV according to patient size and/or use of iterative reconstruction technique. COMPARISON:  07/20/2022, 05/04/2022 FINDINGS: Cardiovascular: Aortic atherosclerosis. Normal heart size. Three-vessel coronary artery calcifications and/or stents. No pericardial effusion. Mediastinum/Nodes: Unchanged prominent mediastinal lymph nodes. Small hiatal hernia. Thyroid gland, trachea, and esophagus demonstrate no significant findings. Lungs/Pleura: Low volume examination with severe elevation of the right hemidiaphragm as well as associated bandlike scarring or atelectasis. Extensive, acutely superimposed ground-glass and heterogeneous airspace disease, particularly in the left lung base (series 4, image 108). Underlying pulmonary fibrosis characterized by recent LD protocol examination dated 05/04/2022 is not appreciably changed, although assessment is substantially limited in the setting of acute airspace disease. No significant air trapping on expiratory phase imaging. Upper Abdomen: No acute  abnormality. Colon anterior to the liver dome in the included upper abdomen (series 3, image 83). Musculoskeletal: No chest wall abnormality. No acute osseous findings. IMPRESSION: 1. Low volume examination with severe elevation of the right hemidiaphragm as well as associated bandlike scarring or atelectasis of the right lung base. 2. Extensive, acutely superimposed ground-glass and heterogeneous airspace disease, particularly in the left lung base. Findings are consistent with multifocal infection or aspiration. 3. Underlying pulmonary fibrosis characterized by recent ILD protocol examination dated 05/04/2022 is not appreciably changed, although assessment is substantially limited in the setting of acute airspace disease. Fibrosis was previously characterized as UIP pattern. 4. Coronary artery disease. Aortic Atherosclerosis (ICD10-I70.0). Electronically Signed   By: Delanna Ahmadi M.D.   On: 07/21/2022 14:36   ECHOCARDIOGRAM COMPLETE  Result Date: 07/21/2022  ECHOCARDIOGRAM REPORT   Patient Name:   [name] Date of Exam: 07/21/2022 Medical Rec #:  [id number]       Height:       67.0 in Accession #:    [id number]      Weight:       162.7 lb Date of Birth:  12/19/1940      BSA:          1.852 m Patient Age:    89 years        BP:           114/51 mmHg Patient Gender: M               HR:           67 bpm. Exam Location:  Inpatient Procedure: 2D Echo, Cardiac Doppler and Color Doppler Indications:    Pulmonary hypertension I27.2  History:        Patient has prior history of Echocardiogram examinations, most                 recent 05/01/2022. CAD and Angina, COPD; Risk                 Factors:Hypertension, Sleep Apnea and Dyslipidemia.  Sonographer:    Ronny Flurry Referring Phys: [employer] [facility] IMPRESSIONS  1. Left ventricular ejection fraction, by estimation, is 60 to 65%. The left ventricle has normal function. The left ventricle has no regional wall motion abnormalities. There is mild left  ventricular hypertrophy of the basal-septal segment. Left ventricular diastolic parameters are indeterminate.  2. Right ventricular systolic function is normal. The right ventricular size is normal. Tricuspid regurgitation signal is inadequate for assessing PA pressure.  3. The mitral valve is normal in structure. Trivial mitral valve regurgitation. No evidence of mitral stenosis.  4. The aortic valve is tricuspid. Aortic valve regurgitation is not visualized. Aortic valve sclerosis/calcification is present, without any evidence of aortic stenosis. Aortic valve area, by VTI measures 2.47 cm. Aortic valve mean gradient measures 6.0 mmHg. Aortic valve Vmax measures 1.63 m/s.  5. The inferior vena cava is normal in size with greater than 50% respiratory variability, suggesting right atrial pressure of 3 mmHg. FINDINGS  Left Ventricle: Left ventricular ejection fraction, by estimation, is 60 to 65%. The left ventricle has normal function. The left ventricle has no regional wall motion abnormalities. The left ventricular internal cavity size was normal in size. There is  mild left ventricular hypertrophy of the basal-septal segment. Left ventricular diastolic parameters are indeterminate. Normal left ventricular filling pressure. Right Ventricle: The right ventricular size is normal. No increase in right ventricular wall thickness. Right ventricular systolic function is normal. Tricuspid regurgitation signal is inadequate for assessing PA pressure. Left Atrium: Left atrial size was normal in size. Right Atrium: Right atrial size was normal in size. Pericardium: There is no evidence of pericardial effusion. Mitral Valve: The mitral valve is normal in structure. Trivial mitral valve regurgitation. No evidence of mitral valve stenosis. Tricuspid Valve: The tricuspid valve is normal in structure. Tricuspid valve regurgitation is not demonstrated. No evidence of tricuspid stenosis. Aortic Valve: The aortic valve is  tricuspid. Aortic valve regurgitation is not visualized. Aortic valve sclerosis/calcification is present, without any evidence of aortic stenosis. Aortic valve mean gradient measures 6.0 mmHg. Aortic valve peak gradient measures 10.6 mmHg. Aortic valve area, by VTI measures 2.47 cm. Pulmonic Valve: The pulmonic valve was normal in structure. Pulmonic valve regurgitation is mild. No evidence of  pulmonic stenosis. Aorta: The aortic root is normal in size and structure. Venous: The inferior vena cava is normal in size with greater than 50% respiratory variability, suggesting right atrial pressure of 3 mmHg. IAS/Shunts: No atrial level shunt detected by color flow Doppler.  LEFT VENTRICLE PLAX 2D LVIDd:         5.00 cm   Diastology LVIDs:         3.20 cm   LV e' medial:    5.66 cm/s LV PW:         1.10 cm   LV E/e' medial:  15.7 LV IVS:        1.20 cm   LV e' lateral:   7.62 cm/s LVOT diam:     2.10 cm   LV E/e' lateral: 11.7 LV SV:         82 LV SV Index:   45 LVOT Area:     3.46 cm  RIGHT VENTRICLE             IVC RV Basal diam:  3.00 cm     IVC diam: 1.20 cm RV S prime:     16.10 cm/s RVOT diam:      3.00 cm TAPSE (M-mode): 1.6 cm LEFT ATRIUM             Index        RIGHT ATRIUM          Index LA diam:        4.30 cm 2.32 cm/m   RA Area:     9.85 cm LA Vol (A2C):   51.4 ml 27.75 ml/m  RA Volume:   20.40 ml 11.01 ml/m LA Vol (A4C):   43.6 ml 23.54 ml/m LA Biplane Vol: 51.7 ml 27.91 ml/m  AORTIC VALVE AV Area (Vmax):    2.74 cm AV Area (Vmean):   2.54 cm AV Area (VTI):     2.47 cm AV Vmax:           163.00 cm/s AV Vmean:          108.000 cm/s AV VTI:            0.334 m AV Peak Grad:      10.6 mmHg AV Mean Grad:      6.0 mmHg LVOT Vmax:         129.00 cm/s LVOT Vmean:        79.100 cm/s LVOT VTI:          0.238 m LVOT/AV VTI ratio: 0.71  AORTA Ao Root diam: 3.20 cm Ao Asc diam:  3.10 cm MITRAL VALVE MV Area (PHT): 3.85 cm    SHUNTS MV Decel Time: 197 msec    Systemic VTI:  0.24 m MV E velocity: 89.10 cm/s   Systemic Diam: 2.10 cm MV A velocity: 92.80 cm/s  Pulmonic Diam: 3.00 cm MV E/A ratio:  0.96 Fransico Him MD Electronically signed by Fransico Him MD Signature Date/Time: 07/21/2022/10:38:18 AM    Final    CT Angio Chest PE W and/or Wo Contrast  Result Date: 07/20/2022 CLINICAL DATA:  Patient having dry cough and shortness of breath for 3-4 days. Patient at swelling in the legs and feet in past couple of days. EXAM: CT ANGIOGRAPHY CHEST WITH CONTRAST TECHNIQUE: Multidetector CT imaging of the chest was performed using the standard protocol during bolus administration of intravenous contrast. Multiplanar CT image reconstructions and MIPs were obtained to evaluate the vascular anatomy. RADIATION DOSE REDUCTION: This exam was  performed according to the departmental dose-optimization program which includes automated exposure control, adjustment of the mA and/or kV according to patient size and/or use of iterative reconstruction technique. CONTRAST:  12m OMNIPAQUE IOHEXOL 350 MG/ML SOLN COMPARISON:  CT examination dated May 04, 2022 FINDINGS: Cardiovascular: Satisfactory opacification of the pulmonary arteries to the segmental level. No evidence of pulmonary embolism. Normal heart size. No pericardial effusion. Prominent coronary artery atherosclerotic calcifications. Main pulmonary trunk is dilated measuring up to 3.2 cm concerning for pulmonary arterial hypertension. Mediastinum/Nodes: No enlarged mediastinal, hilar, or axillary lymph nodes. Thyroid gland, trachea, and esophagus demonstrate no significant findings. Lungs/Pleura: Prominence of the central pulmonary vessels as well as mild bilateral perihilar and lower lobe ground-glass opacities. Findings may be secondary to infectious/inflammatory process or mild pulmonary edema in the background of chronic interstitial lung disease with mild bronchiectasis and scarring. No large pleural effusion. Chronic elevation of the right hemidiaphragm, unchanged.  Upper Abdomen: No acute abnormality. 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 Review of the MIP images confirms the above findings. IMPRESSION: 1. No evidence of pulmonary embolism. 2. Prominence of the central pulmonary vessels as well as mild bilateral perihilar and lower lobe ground-glass opacities. Findings may be secondary to mild pulmonary edema or infectious/inflammatory process in the background of chronic interstitial lung disease. No large pleural effusion. 3. Prominent coronary artery atherosclerotic calcifications. 4. Main pulmonary trunk is dilated measuring up to 3.2 cm concerning for pulmonary arterial hypertension. Electronically Signed   By: IKeane PoliceD.O.   On: 07/20/2022 22:09   [name] 2 View  Result Date: 07/20/2022 CLINICAL DATA:  Cough, COPD EXAM: CHEST - 2 VIEW COMPARISON:  03/11/2022 FINDINGS: Frontal and lateral views of the chest demonstrate a stable cardiac silhouette. Chronic elevation of the right hemidiaphragm. Stable bibasilar fibrosis and bronchiectasis. No acute airspace disease, effusion, or pneumothorax. IMPRESSION: 1. Stable bibasilar fibrosis and bronchiectasis. 2. No acute airspace disease. 3. Chronic elevation right hemidiaphragm. Electronically Signed   By: MRanda NgoM.D.   On: 07/20/2022 17:35      Subjective: Patient seen and examined at bedside today.  Hemodynamically stable for discharge.  I called and discussed with the wife about the discharge plan.  Discharge Exam: Vitals:   07/23/22 0430 07/23/22 0839  BP: 114/61 (!) 127/55  Pulse: (!) 54 73  Resp: 17 18  Temp: (!) 97.5 F (36.4 C) 98 F (36.7 C)  SpO2: 98% 98%   Vitals:   07/22/22 2111 07/22/22 2120 07/23/22 0430 07/23/22 0839  BP: 135/62  114/61 (!) 127/55  Pulse: 77  (!) 54 73  Resp: '18  17 18  '$ Temp: 98.4 F (36.9 C)  (!) 97.5 F (36.4 C) 98 F (36.7 C)  TempSrc: Oral  Oral Oral  SpO2: 91% 97% 98% 98%   Weight: 74.3 kg     Height:        General: Pt is alert, awake, not in acute distress Cardiovascular: RRR, S1/S2 +, no rubs, no gallops Respiratory: CTA bilaterally, no wheezing, no rhonchi Abdominal: Soft, NT, ND, bowel sounds + Extremities: no edema, no cyanosis    The results of significant diagnostics from this hospitalization (including imaging, microbiology, ancillary and laboratory) are listed below for reference.     Microbiology: Recent Results (from the past 240 hour(s))  Resp panel by RT-PCR (RSV, Flu A&B, Covid) Anterior Nasal Swab     Status: None   Collection Time: 07/20/22  6:57 PM  Specimen: Anterior Nasal Swab  Result Value Ref Range Status   SARS Coronavirus 2 by RT PCR NEGATIVE NEGATIVE Final    Comment: (NOTE) SARS-CoV-2 target nucleic acids are NOT DETECTED.  The SARS-CoV-2 RNA is generally detectable in upper respiratory specimens during the acute phase of infection. The lowest concentration of SARS-CoV-2 viral copies this assay can detect is 138 copies/mL. A negative result does not preclude SARS-Cov-2 infection and should not be used as the sole basis for treatment or other patient management decisions. A negative result may occur with  improper specimen collection/handling, submission of specimen other than nasopharyngeal swab, presence of viral mutation(s) within the areas targeted by this assay, and inadequate number of viral copies(<138 copies/mL). A negative result must be combined with clinical observations, patient history, and epidemiological information. The expected result is Negative.  Fact Sheet for Patients:  EntrepreneurPulse.com.au  Fact Sheet for Healthcare Providers:  IncredibleEmployment.be  This test is no t yet approved or cleared by the [place] FDA and  has been authorized for detection and/or diagnosis of SARS-CoV-2 by FDA under an Emergency Use Authorization (EUA). This EUA will  remain  in effect (meaning this test can be used) for the duration of the COVID-19 declaration under Section 564(b)(1) of the Act, 21 U.S.C.section 360bbb-3(b)(1), unless the authorization is terminated  or revoked sooner.       Influenza A by PCR NEGATIVE NEGATIVE Final   Influenza B by PCR NEGATIVE NEGATIVE Final    Comment: (NOTE) The Xpert Xpress SARS-CoV-2/FLU/RSV plus assay is intended as an aid in the diagnosis of influenza from Nasopharyngeal swab specimens and should not be used as a sole basis for treatment. Nasal washings and aspirates are unacceptable for Xpert Xpress SARS-CoV-2/FLU/RSV testing.  Fact Sheet for Patients: EntrepreneurPulse.com.au  Fact Sheet for Healthcare Providers: IncredibleEmployment.be  This test is not yet approved or cleared by the [place] FDA and has been authorized for detection and/or diagnosis of SARS-CoV-2 by FDA under an Emergency Use Authorization (EUA). This EUA will remain in effect (meaning this test can be used) for the duration of the COVID-19 declaration under Section 564(b)(1) of the Act, 21 U.S.C. section 360bbb-3(b)(1), unless the authorization is terminated or revoked.     Resp Syncytial Virus by PCR NEGATIVE NEGATIVE Final    Comment: (NOTE) Fact Sheet for Patients: EntrepreneurPulse.com.au  Fact Sheet for Healthcare Providers: IncredibleEmployment.be  This test is not yet approved or cleared by the [place] FDA and has been authorized for detection and/or diagnosis of SARS-CoV-2 by FDA under an Emergency Use Authorization (EUA). This EUA will remain in effect (meaning this test can be used) for the duration of the COVID-19 declaration under Section 564(b)(1) of the Act, 21 U.S.C. section 360bbb-3(b)(1), unless the authorization is terminated or revoked.  Performed at [facility], [place] [address], [place], [place] [address]    Respiratory (~20 pathogens) panel by PCR     Status: None   Collection Time: 07/20/22  6:57 PM   Specimen: Nasopharyngeal Swab; Respiratory  Result Value Ref Range Status   Adenovirus NOT DETECTED NOT DETECTED Final   Coronavirus 229E NOT DETECTED NOT DETECTED Final    Comment: (NOTE) The Coronavirus on the Respiratory Panel, DOES NOT test for the novel  Coronavirus (2019 nCoV)    Coronavirus HKU1 NOT DETECTED NOT DETECTED Final   Coronavirus NL63 NOT DETECTED NOT DETECTED Final   Coronavirus OC43 NOT DETECTED NOT DETECTED Final   Metapneumovirus NOT DETECTED NOT DETECTED Final  Rhinovirus / Enterovirus NOT DETECTED NOT DETECTED Final   Influenza A NOT DETECTED NOT DETECTED Final   Influenza B NOT DETECTED NOT DETECTED Final   Parainfluenza Virus 1 NOT DETECTED NOT DETECTED Final   Parainfluenza Virus 2 NOT DETECTED NOT DETECTED Final   Parainfluenza Virus 3 NOT DETECTED NOT DETECTED Final   Parainfluenza Virus 4 NOT DETECTED NOT DETECTED Final   Respiratory Syncytial Virus NOT DETECTED NOT DETECTED Final   Bordetella pertussis NOT DETECTED NOT DETECTED Final   Bordetella Parapertussis NOT DETECTED NOT DETECTED Final   Chlamydophila pneumoniae NOT DETECTED NOT DETECTED Final   Mycoplasma pneumoniae NOT DETECTED NOT DETECTED Final    Comment: Performed at [facility], [place] [address] [place], [place] [address]  Culture, Respiratory w Gram Stain     Status: None (Preliminary result)   Collection Time: 07/22/22 10:14 AM   Specimen: Tracheal Aspirate; Respiratory  Result Value Ref Range Status   Specimen Description TRACHEAL ASPIRATE  Final   Special Requests NONE  Final   Gram Stain   Final    RARE WBC PRESENT,BOTH PMN AND MONONUCLEAR FEW GRAM POSITIVE COCCI IN PAIRS RARE GRAM NEGATIVE RODS RARE GRAM POSITIVE RODS    Culture   Final    CULTURE REINCUBATED FOR BETTER GROWTH Performed at [facility], [place] [address], [place], [place] [address]    Report Status  PENDING  Incomplete     Labs: BNP (last 3 results) Recent Labs    07/20/22 1903  BNP 70.6   Basic Metabolic Panel: Recent Labs  Lab 07/17/22 0759 07/20/22 1903 07/21/22 0715 07/22/22 0433  NA 140 138 139 141  K 4.1 4.0 4.1 3.9  CL 103 102 99 102  CO2 '31 26 29 26  '$ GLUCOSE 108* 116* 99 171*  BUN '14 16 13 '$ 24*  CREATININE 0.78 0.83 0.84 1.04  CALCIUM 10.0 9.5 8.9 9.3  MG  --   --   --  2.1  PHOS  --   --   --  4.8*   Liver Function Tests: Recent Labs  Lab 07/17/22 0759 07/20/22 1903  AST 23 25  ALT 18 19  ALKPHOS 94 87  BILITOT 0.7 0.8  PROT 7.0 7.0  ALBUMIN 4.5 4.0   No results for input(s): "LIPASE", "AMYLASE" in the last 168 hours. No results for input(s): "AMMONIA" in the last 168 hours. CBC: Recent Labs  Lab 07/17/22 0759 07/20/22 1903 07/21/22 0715 07/22/22 0433 07/23/22 0410  WBC 17.8* 21.1* 16.6* 25.2* 36.2*  NEUTROABS 13.0* 17.7*  --   --   --   HGB 11.9* 11.2* 10.1* 10.9* 10.8*  HCT 38.2* 37.0* 33.3* 34.6* 35.8*  MCV 94.6 96.1 94.1 94.8 95.7  PLT 201 209 175 188 234   Cardiac Enzymes: No results for input(s): "CKTOTAL", "CKMB", "CKMBINDEX", "TROPONINI" in the last 168 hours. BNP: Invalid input(s): "POCBNP" CBG: Recent Labs  Lab 07/22/22 1727 07/22/22 1733 07/22/22 2111 07/23/22 0722 07/23/22 1123  GLUCAP 98 124* 136* 96 131*   D-Dimer No results for input(s): "DDIMER" in the last 72 hours. Hgb A1c Recent Labs    07/22/22 0432  HGBA1C 5.4   Lipid Profile No results for input(s): "CHOL", "HDL", "LDLCALC", "TRIG", "CHOLHDL", "LDLDIRECT" in the last 72 hours. Thyroid function studies No results for input(s): "TSH", "T4TOTAL", "T3FREE", "THYROIDAB" in the last 72 hours.  Invalid input(s): "FREET3" Anemia work up No results for input(s): "VITAMINB12", "FOLATE", "FERRITIN", "TIBC", "IRON", "RETICCTPCT" in the last 72 hours.  Urinalysis    Component Value Date/Time   COLORURINE LT. YELLOW 06/04/2011 1024   APPEARANCEUR CLEAR  06/04/2011 1024   LABSPEC 1.025 06/04/2011 1024   PHURINE 6.0 06/04/2011 1024   GLUCOSEU 100 06/04/2011 1024   HGBUR NEGATIVE 06/04/2011 1024   BILIRUBINUR negative 09/11/2020 0857   KETONESUR negative 09/11/2020 0857   KETONESUR NEGATIVE 06/04/2011 1024   PROTEINUR negative 09/11/2020 0857   UROBILINOGEN 0.2 09/11/2020 0857   UROBILINOGEN 0.2 06/04/2011 1024   NITRITE Negative 09/11/2020 0857   NITRITE NEGATIVE 06/04/2011 1024   LEUKOCYTESUR Negative 09/11/2020 0857   Sepsis Labs Recent Labs  Lab 07/20/22 1903 07/21/22 0715 07/22/22 0433 07/23/22 0410  WBC 21.1* 16.6* 25.2* 36.2*   Microbiology Recent Results (from the past 240 hour(s))  Resp panel by RT-PCR (RSV, Flu A&B, Covid) Anterior Nasal Swab     Status: None   Collection Time: 07/20/22  6:57 PM   Specimen: Anterior Nasal Swab  Result Value Ref Range Status   SARS Coronavirus 2 by RT PCR NEGATIVE NEGATIVE Final    Comment: (NOTE) SARS-CoV-2 target nucleic acids are NOT DETECTED.  The SARS-CoV-2 RNA is generally detectable in upper respiratory specimens during the acute phase of infection. The lowest concentration of SARS-CoV-2 viral copies this assay can detect is 138 copies/mL. A negative result does not preclude SARS-Cov-2 infection and should not be used as the sole basis for treatment or other patient management decisions. A negative result may occur with  improper specimen collection/handling, submission of specimen other than nasopharyngeal swab, presence of viral mutation(s) within the areas targeted by this assay, and inadequate number of viral copies(<138 copies/mL). A negative result must be combined with clinical observations, patient history, and epidemiological information. The expected result is Negative.  Fact Sheet for Patients:  EntrepreneurPulse.com.au  Fact Sheet for Healthcare Providers:  IncredibleEmployment.be  This test is no t yet approved or  cleared by the [place] FDA and  has been authorized for detection and/or diagnosis of SARS-CoV-2 by FDA under an Emergency Use Authorization (EUA). This EUA will remain  in effect (meaning this test can be used) for the duration of the COVID-19 declaration under Section 564(b)(1) of the Act, 21 U.S.C.section 360bbb-3(b)(1), unless the authorization is terminated  or revoked sooner.       Influenza A by PCR NEGATIVE NEGATIVE Final   Influenza B by PCR NEGATIVE NEGATIVE Final    Comment: (NOTE) The Xpert Xpress SARS-CoV-2/FLU/RSV plus assay is intended as an aid in the diagnosis of influenza from Nasopharyngeal swab specimens and should not be used as a sole basis for treatment. Nasal washings and aspirates are unacceptable for Xpert Xpress SARS-CoV-2/FLU/RSV testing.  Fact Sheet for Patients: EntrepreneurPulse.com.au  Fact Sheet for Healthcare Providers: IncredibleEmployment.be  This test is not yet approved or cleared by the [place] FDA and has been authorized for detection and/or diagnosis of SARS-CoV-2 by FDA under an Emergency Use Authorization (EUA). This EUA will remain in effect (meaning this test can be used) for the duration of the COVID-19 declaration under Section 564(b)(1) of the Act, 21 U.S.C. section 360bbb-3(b)(1), unless the authorization is terminated or revoked.     Resp Syncytial Virus by PCR NEGATIVE NEGATIVE Final    Comment: (NOTE) Fact Sheet for Patients: EntrepreneurPulse.com.au  Fact Sheet for Healthcare Providers: IncredibleEmployment.be  This test is not yet approved or cleared by the [place] FDA and has been authorized for detection and/or diagnosis of SARS-CoV-2 by FDA under an Emergency Use Authorization (EUA).  This EUA will remain in effect (meaning this test can be used) for the duration of the COVID-19 declaration under Section 564(b)(1) of the Act, 21  U.S.C. section 360bbb-3(b)(1), unless the authorization is terminated or revoked.  Performed at [facility], [place] [address] [place] [address], [place], [place] [address]   Respiratory (~20 pathogens) panel by PCR     Status: None   Collection Time: 07/20/22  6:57 PM   Specimen: Nasopharyngeal Swab; Respiratory  Result Value Ref Range Status   Adenovirus NOT DETECTED NOT DETECTED Final   Coronavirus 229E NOT DETECTED NOT DETECTED Final    Comment: (NOTE) The Coronavirus on the Respiratory Panel, DOES NOT test for the novel  Coronavirus (2019 nCoV)    Coronavirus HKU1 NOT DETECTED NOT DETECTED Final   Coronavirus NL63 NOT DETECTED NOT DETECTED Final   Coronavirus OC43 NOT DETECTED NOT DETECTED Final   Metapneumovirus NOT DETECTED NOT DETECTED Final   Rhinovirus / Enterovirus NOT DETECTED NOT DETECTED Final   Influenza A NOT DETECTED NOT DETECTED Final   Influenza B NOT DETECTED NOT DETECTED Final   Parainfluenza Virus 1 NOT DETECTED NOT DETECTED Final   Parainfluenza Virus 2 NOT DETECTED NOT DETECTED Final   Parainfluenza Virus 3 NOT DETECTED NOT DETECTED Final   Parainfluenza Virus 4 NOT DETECTED NOT DETECTED Final   Respiratory Syncytial Virus NOT DETECTED NOT DETECTED Final   Bordetella pertussis NOT DETECTED NOT DETECTED Final   Bordetella Parapertussis NOT DETECTED NOT DETECTED Final   Chlamydophila pneumoniae NOT DETECTED NOT DETECTED Final   Mycoplasma pneumoniae NOT DETECTED NOT DETECTED Final    Comment: Performed at [facility], [place]. [address] [place], [place] [address]  Culture, Respiratory w Gram Stain     Status: None (Preliminary result)   Collection Time: 07/22/22 10:14 AM   Specimen: Tracheal Aspirate; Respiratory  Result Value Ref Range Status   Specimen Description TRACHEAL ASPIRATE  Final   Special Requests NONE  Final   Gram Stain   Final    RARE WBC PRESENT,BOTH PMN AND MONONUCLEAR FEW GRAM POSITIVE COCCI IN PAIRS RARE GRAM NEGATIVE RODS RARE GRAM POSITIVE  RODS    Culture   Final    CULTURE REINCUBATED FOR BETTER GROWTH Performed at [facility], [place] [address], [place], [place] [address]    Report Status PENDING  Incomplete    Please note: You were cared for by a hospitalist during your hospital stay. Once you are discharged, your primary care physician will handle any further medical issues. Please note that NO REFILLS for any discharge medications will be authorized once you are discharged, as it is imperative that you return to your primary care physician (or establish a relationship with a primary care physician if you do not have one) for your post hospital discharge needs so that they can reassess your need for medications and monitor your lab values.    Time coordinating discharge: 40 minutes  SIGNED:   Shelly Coss, MD  [facility] 07/23/2022, 11:51 AM Pager [phone]  If 7PM-7AM, please contact night-coverage [email or web address] Password [email or web address]

## 2022-07-23 NOTE — Telephone Encounter (Signed)
Patient may be potential pirfenidone new start (pending OV from 07/29/2022).  Submitted a Prior Authorization request to [facility] for PIRFENIDONE via CoverMyMeds. Will update once we receive a response.  Key: Electa Sniff, PharmD, MPH, BCPS, CPP Clinical Pharmacist (Rheumatology and Pulmonology)

## 2022-07-23 NOTE — Telephone Encounter (Signed)
Received notification from [facility] regarding a prior authorization for [name]. Authorization has been APPROVED from 07/23/22 to 08/12/2023. Approval letter sent to scan center.  Per test claim, copay for 30 days supply is $2897.17  Patient must fill through [employer]: [phone]  Authorization # [id number]  Phone: [phone]  I called patient to review. He again confirmed today that he does not qualify for patient assistance based on financials. He would not quality for any grant either (as their income thresholds are even lower). I did review the donut hole and catastrophic coverage. Advised that I don't anticipate that copay for medication would be ~$2900 per month but would be at least several hundred dollars per month even within the catastrophic coverage phase. He states he is not sure if he'd want to pay this. I did review the option of filling only one month at a time until he knows he is tolerating without issue.  Patient states he'd like to wait until appt with [name] on 07/29/2022 to further discuss.  Knox Saliva, PharmD, MPH, BCPS, CPP Clinical Pharmacist (Rheumatology and Pulmonology)

## 2022-07-23 NOTE — Telephone Encounter (Signed)
I talked to [name] today. He has HP (non-IPF). Patient may not qualify for clinical studies either. We will run PA for pirfenidone for now for IPF diagnosis and check copay on this prior to appt with Dr. Denyse Amass, PharmD, MPH, BCPS, CPP Clinical Pharmacist (Rheumatology and Pulmonology)

## 2022-07-24 ENCOUNTER — Encounter: Payer: Medicare HMO | Admitting: Family Medicine

## 2022-07-24 ENCOUNTER — Ambulatory Visit (INDEPENDENT_AMBULATORY_CARE_PROVIDER_SITE_OTHER): Payer: Medicare HMO | Admitting: Family Medicine

## 2022-07-24 ENCOUNTER — Encounter: Payer: Self-pay | Admitting: Family Medicine

## 2022-07-24 VITALS — BP 122/68 | HR 71 | Temp 98.2°F | Ht 67.0 in | Wt 162.4 lb

## 2022-07-24 DIAGNOSIS — L03115 Cellulitis of right lower limb: Secondary | ICD-10-CM

## 2022-07-24 DIAGNOSIS — L03116 Cellulitis of left lower limb: Secondary | ICD-10-CM

## 2022-07-24 DIAGNOSIS — J849 Interstitial pulmonary disease, unspecified: Secondary | ICD-10-CM

## 2022-07-24 DIAGNOSIS — D471 Chronic myeloproliferative disease: Secondary | ICD-10-CM

## 2022-07-24 DIAGNOSIS — J9601 Acute respiratory failure with hypoxia: Secondary | ICD-10-CM

## 2022-07-24 LAB — CULTURE, RESPIRATORY W GRAM STAIN: Culture: NORMAL

## 2022-07-24 LAB — ALLERGENS W/TOTAL IGE AREA 2
Alternaria Alternata IgE: <0.1 kU/L
Aspergillus Fumigatus IgE: <0.1 kU/L
Bermuda Grass IgE: 0.18 kU/L — AB
Cat Dander IgE: <0.1 kU/L
Cedar, Mountain IgE: 0.14 kU/L — AB
Cladosporium Herbarum IgE: <0.1 kU/L
Cockroach, German IgE: <0.1 kU/L
Common Silver Birch IgE: 0.13 kU/L — AB
Cottonwood IgE: 0.15 kU/L — AB
D Farinae IgE: 1.28 kU/L — AB
D Pteronyssinus IgE: 0.89 kU/L — AB
Dog Dander IgE: <0.1 kU/L
Elm, American IgE: 0.17 kU/L — AB
IgE (Immunoglobulin E), Serum: 40 IU/mL (ref 6–495)
Johnson Grass IgE: 0.15 kU/L — AB
Maple/Box Elder IgE: 0.18 kU/L — AB
Mouse Urine IgE: <0.1 kU/L
Oak, White IgE: 0.16 kU/L — AB
Pecan, Hickory IgE: 0.14 kU/L — AB
Penicillium Chrysogen IgE: <0.1 kU/L
Pigweed, Rough IgE: 0.13 kU/L — AB
Ragweed, Short IgE: 0.18 kU/L — AB
Sheep Sorrel IgE Qn: 0.2 kU/L — AB
Timothy Grass IgE: 0.18 kU/L — AB
White Mulberry IgE: <0.1 kU/L

## 2022-07-24 NOTE — Progress Notes (Signed)
Subjective:  Patient ID: [name], male    DOB: 06-04-1941  Age: 81 y.o. MRN: [id number]  CC:  Chief Complaint  Patient presents with   Hospitalization Follow-up    Pt states he was in the hospital for acute pulmonary fibrosis    Depression    PHQ9 - 4    HPI [name] presents for  Hospital follow-up.  Acute exacerbation of interstitial lung disease With acute hypoxic respite failure.  Admitted December 9 through December 12.  Presented with increasing dyspnea previous 2 to 3 weeks, cough, pedal edema.  Hypoxic on presentation, placed on O2 nasal cannula.  CT angiogram negative for PE.   pulmonary consulted.  CT showed features of UIP with extensive acutely superimposed groundglass and heterogeneous airspace disease particularly left lung base.  Additionally started on prophylactic Bactrim for PCP -3 days/week for 23 days.  Respiratory viral panel negative, sputum culture with mixed flora.  2D echo with EF 62%, no sign of DVT on ultrasound.  Started on oxygen with home oxygen at 2 L and follow-up planned with pulmonary December 18.  Initial treatment with IV steroids, transition to tapering dose of oral steroid planned - will pick up today. Outpatient treatment with antifibrotic nintedanib planned - may be cost prohibitive.  Furosemide in hospital - swelling improved. Not taken at home.   Has oxygen at home - O2 ran out last night. Has concentrator - issue with use last night. New equipment arriving today. 84-86% this am, machine working now.  No fever.  Some cough with lying down last night - better in recliner.    Lower extremity cellulitis Treated with antibiotics, transition to oral antibiotics in hospital, improving per hospital notes.  Augmentin 875 mg twice daily for 3 additional days - will be picking up today.  Continues to improve. Redness improved.   Leukocytosis with mild proliferative disorder Chronic leukocytosis, thrombocytopenia.  Oncology/hematology  Dr. Earlie Server.  Treated with Hydrea.  Worsening leukocytosis to 36,000, thought to be due to steroids after discussion with hematology.  Outpatient follow-up next few weeks with hematology, and repeat CBC in approximately 1 week recommended. Lab Results  Component Value Date   WBC 36.2 (H) 07/23/2022   HGB 10.8 (L) 07/23/2022   HCT 35.8 (L) 07/23/2022   MCV 95.7 07/23/2022   PLT 234 07/23/2022   Plans on contacting Dr. Dema Severin regarding upcoming urologic procedure.   History Patient Active Problem List   Diagnosis Date Noted   Acute respiratory failure with hypoxia ([place]) 07/20/2022   Myeloproliferative disorder ([place]) 07/20/2022   Leukocytosis 06/03/2022   Hypertrophy of prostate without urinary obstruction and other lower urinary tract symptoms (LUTS) 12/04/2021   Prostate cancer ([place]) 05/29/2021   GERD (gastroesophageal reflux disease) 12/15/2017   Unstable angina ([place]) 12/05/2017   Angina at rest 12/05/2017   Atypical chest pain    Dyslipidemia    COPD not affecting current episode of care    Essential hypertension 05/29/2017   Abnormal nuclear cardiac imaging test 01/14/2017   Bronchiectasis without complication ([place]) 71/01/2693   ILD (interstitial lung disease) ([place]) 11/27/2016   Dizziness 10/21/2016   Cough variant asthma  vs UACS 07/22/2016   Obstructive sleep apnea 12/01/2015   Acute bronchitis 11/30/2015   Swelling of lower extremity 10/16/2015   Benign mole 02/04/2014   Impaired glucose tolerance 10/26/2013   NAFLD (nonalcoholic fatty liver disease) 04/27/2013   Thrombocytopenia ([place]) 10/21/2012   Exertional angina 09/03/2012   MALARIA 12/05/2009   COLONIC  POLYPS 12/05/2009   HYPERCHOLESTEROLEMIA 12/05/2009   ANXIETY 12/05/2009   HEMORRHOIDS 12/05/2009   Allergic rhinitis 12/05/2009   GANGLION CYST, WRIST, LEFT 12/05/2009   ROTATOR CUFF TEAR 12/05/2009   OTHER DISORDER OF MUSCLE LIGAMENT AND FASCIA 12/05/2009   ABNORMAL CHEST XRAY 12/05/2009   CAD (coronary  artery disease) 04/05/2009   Past Medical History:  Diagnosis Date   Abnormal chest x-ray    Allergic rhinitis    Ankylosing spondylitis (HCC)    Anxiety    Arthritis    Colonic polyp    Coronary atherosclerosis of native coronary artery    Cough    Dizziness 10/21/2016   Ganglion cyst of wrist    left   Heart attack ([place]) 2008   Hemorrhoids    History of malaria    Hyperlipidemia    with low HDL   Hypertension    Kidney stone    Other disorder of muscle, ligament, and fascia    Rotator cuff tear    Swelling of lower extremity 10/16/2015   Past Surgical History:  Procedure Laterality Date   bicep rupture     CORONARY ANGIOPLASTY WITH STENT PLACEMENT     HEMORRHOID SURGERY  05/2009   in [place]   LEFT HEART CATH AND CORONARY ANGIOGRAPHY N/A 01/14/2017   Procedure: Left Heart Cath and Coronary Angiography;  Surgeon: [name], MD;  Location: [facility];  Service: Cardiovascular;  Laterality: N/A;   LEFT HEART CATHETERIZATION WITH CORONARY ANGIOGRAM N/A 09/03/2012   Procedure: LEFT HEART CATHETERIZATION WITH CORONARY ANGIOGRAM;  Surgeon: [name], MD;  Location: [facility] CATH [facility];  Service: Cardiovascular;  Laterality: N/A;   PROSTATE BIOPSY     ROTATOR CUFF REPAIR     bilat by Dr. French Ana   Allergies  Allergen Reactions   Celebrex [Celecoxib]     Pt states precipitated his heart attack.   Tramadol Other (See Comments)    Makes patient jumpy   Prior to Admission medications   Medication Sig Start Date End Date Taking? Authorizing Provider  acetaminophen (TYLENOL) 325 MG tablet Take 650 mg by mouth every 6 (six) hours as needed for mild pain or moderate pain.   Yes [provider]  amLODipine (NORVASC) 2.5 MG tablet Take 1 tablet (2.5 mg total) by mouth daily. 04/22/22  Yes [name], MD  Apoaequorin (PREVAGEN PO) Take 1 tablet by mouth daily.    Yes [provider]  aspirin 81 MG EC tablet Take 81 mg by mouth daily.   Yes [provider]  budesonide-formoterol (SYMBICORT) 80-4.5 MCG/ACT inhaler Inhale 2 puffs into the lungs 2 (two) times daily. Patient taking differently: Inhale 2 puffs into the lungs as needed. 03/11/22  Yes [name], MD  Cholecalciferol (VITAMIN D3) 1.25 MG (50000 UT) CAPS Take 1 capsule by mouth daily. Pt does not know dose.   Yes [provider]  hydroxyurea (HYDREA) 500 MG capsule Take 1 capsule (500 mg total) by mouth daily. May take with food to minimize GI side effects. Patient taking differently: Take 500 mg by mouth daily. May take with food to minimize GI side effects.  (At 7:00pm) 06/03/22  Yes Curt Bears, MD  Multiple Vitamins-Minerals (ICAPS AREDS 2) CAPS Take by mouth daily.   Yes [provider]  pantoprazole (PROTONIX) 40 MG tablet Take 1 tablet (40 mg total) by mouth 2 (two) times daily. 07/23/22 08/22/22 Yes Shelly Coss, MD  simvastatin (ZOCOR) 20 MG tablet TAKE 1 TABLET EVERY  DAY WITH BREAKFAST (APPOINTMENT IS NEEDED FOR REFILLS) Patient taking differently: Take 20 mg by mouth daily. WITH BREAKFAST (APPOINTMENT IS NEEDED FOR REFILLS) 12/19/21  Yes [name], MD  UNABLE TO FIND Med Name: CPAP per Dr Halford Chessman   Yes [provider]  amoxicillin-clavulanate (AUGMENTIN) 875-125 MG tablet Take 1 tablet by mouth every 12 (twelve) hours for 3 days. Patient not taking: Reported on 07/24/2022 07/24/22 07/27/22  Shelly Coss, MD  guaiFENesin (MUCINEX) 600 MG 12 hr tablet Take 2 tablets (1,200 mg total) by mouth 2 (two) times daily for 7 days. Patient not taking: Reported on 07/24/2022 07/23/22 07/30/22  Shelly Coss, MD  predniSONE (DELTASONE) 20 MG tablet Take 3 tablets (60 mg total) by mouth daily with breakfast for 6 days. Patient not taking: Reported on 07/24/2022 07/24/22 07/30/22  Shelly Coss, MD  predniSONE (DELTASONE) 20 MG tablet Take 2 tablets (40 mg total) by mouth daily with breakfast for 7 days. Patient not taking: Reported  on 07/24/2022 07/30/22 08/06/22  Shelly Coss, MD  predniSONE (DELTASONE) 20 MG tablet Take 1 tablet (20 mg total) by mouth daily with breakfast for 7 days. Patient not taking: Reported on 07/24/2022 08/06/22 08/13/22  Shelly Coss, MD  prochlorperazine (COMPAZINE) 10 MG tablet Take 1 tablet (10 mg total) by mouth every 6 (six) hours as needed for nausea or vomiting. Patient not taking: Reported on 07/20/2022 06/03/22   Curt Bears, MD  sulfamethoxazole-trimethoprim (BACTRIM DS) 800-160 MG tablet Take 1 tablet by mouth 3 (three) times a week for 23 days. Patient not taking: Reported on 07/24/2022 07/24/22 08/16/22  Shelly Coss, MD  valACYclovir (VALTREX) 500 MG tablet Take 1 tablet (500 mg total) by mouth 2 (two) times daily. Take at 1st sign of penile rash. Patient not taking: Reported on 07/24/2022 05/27/22   [name], MD   Social History   Socioeconomic History   Marital status: Married    Spouse name: [name]   Number of children: 2   Years of education: Not on file   Highest education level: Not on file  Occupational History   Not on file  Tobacco Use   Smoking status: Never    Passive exposure: Never   Smokeless tobacco: Never  Vaping Use   Vaping Use: Never used  Substance and Sexual Activity   Alcohol use: Yes    Comment: holiday Rare    Drug use: No   Sexual activity: Not Currently  Other Topics Concern   Not on file  Social History Narrative   Not on file   Social Determinants of Health   Financial Resource Strain: Low Risk  (04/03/2022)   Overall Financial Resource Strain (CARDIA)    Difficulty of Paying Living Expenses: Not hard at all  Food Insecurity: No Food Insecurity (07/21/2022)   Hunger Vital Sign    Worried About Running Out of Food in the Last Year: Never true    Ran Out of Food in the Last Year: Never true  Transportation Needs: No Transportation Needs (07/21/2022)   PRAPARE - Hydrologist (Medical): No     Lack of Transportation (Non-Medical): No  Physical Activity: Inactive (04/03/2022)   Exercise Vital Sign    Days of Exercise per Week: 0 days    Minutes of Exercise per Session: 0 min  Stress: No Stress Concern Present (04/03/2022)   [place]    Feeling of Stress : Not at all  Social  Connections: Moderately Integrated (04/03/2022)   Social Connection and Isolation Panel [NHANES]    Frequency of Communication with Friends and Family: Three times a week    Frequency of Social Gatherings with Friends and Family: Three times a week    Attends Religious Services: Never    Active Member of Clubs or Organizations: Yes    Attends Archivist Meetings: More than 4 times per year    Marital Status: Married  Human resources officer Violence: Not At Risk (07/21/2022)   Humiliation, Afraid, Rape, and Kick questionnaire    Fear of Current or Ex-Partner: No    Emotionally Abused: No    Physically Abused: No    Sexually Abused: No   Review of Systems Per HPI.   Objective:   Vitals:   07/24/22 0821  BP: 122/68  Pulse: 71  Temp: 98.2 F (36.8 C)  SpO2: 94%  Weight: 162 lb 6.4 oz (73.7 kg)  Height: '5\' 7"'$  (1.702 m)     Physical Exam Vitals reviewed.  Constitutional:      General: He is not in acute distress.    Appearance: Normal appearance. He is well-developed. He is not ill-appearing or diaphoretic.  HENT:     Head: Normocephalic and atraumatic.  Neck:     Vascular: No carotid bruit or JVD.  Cardiovascular:     Rate and Rhythm: Normal rate and regular rhythm.     Heart sounds: Normal heart sounds. No murmur heard. Pulmonary:     Effort: Pulmonary effort is normal.     Breath sounds: Rales (Diffuse coarse breath sounds, no wheezes, no respiratory distress.) present.  Musculoskeletal:     Right lower leg: Edema (Trace to 1+ edema lower extremities bilaterally, no minimal warmth left greater than right lower  extremity, no rash.) present.     Left lower leg: Edema present.  Skin:    General: Skin is warm and dry.  Neurological:     Mental Status: He is alert and oriented to person, place, and time.  Psychiatric:        Mood and Affect: Mood normal.     Assessment & Plan:  [name] is a 81 y.o. male . ILD (interstitial lung disease) ([place]) Acute respiratory failure with hypoxia (HCC)  -Status post hospitalization as above, difficulty with oxygen concentrator last night but is working this morning.  O2 sat in office okay.  Denies any fevers or change in breathing, continue prednisone, Bactrim for PCP prevention, follow-up with pulmonary in 5 days as planned.  Handicap placard provided.  Myeloproliferative disorder ([place])  -Increasing leukocytes likely related to prednisone use, plan for repeat CBC in 1 week, follow-up as planned with hematology for now with RTC/ER precautions given.  Bilateral lower leg cellulitis  -Improving, continue Augmentin as planned with RTC precautions.  Recheck in 1 month for review of chronic meds, possible repeat labs at that time.  ER/RTC precautions sooner if new/worsening symptoms.  No orders of the defined types were placed in this encounter.  Patient Instructions  Keep follow up with pulmonary as planned and discuss any machine difficulties with medical supply company.  Take meds as planned from hospital. If fever, worsening swelling of legs/redness, or worsening shortness of breath be seen.  Bloodwork at [facility] in 1 week.   Hang in there!  Return to the clinic or go to the nearest emergency room if any of your symptoms worsen or new symptoms occur.   [facility] in 8:30-4:30  during weekdays, no appointment needed [place].  [place], [place] [id number]     Signed,   Merri Ray, MD [place], [facility] 07/24/22 9:08 AM

## 2022-07-24 NOTE — Patient Instructions (Signed)
Keep follow up with pulmonary as planned and discuss any machine difficulties with medical supply company.  Take meds as planned from hospital. If fever, worsening swelling of legs/redness, or worsening shortness of breath be seen.  Bloodwork at [facility] in 1 week.   Hang in there!  Return to the clinic or go to the nearest emergency room if any of your symptoms worsen or new symptoms occur.   [facility] in 8:30-4:30 during weekdays, no appointment needed [place].  [place], [place] [id number]

## 2022-07-25 ENCOUNTER — Telehealth: Payer: Self-pay

## 2022-07-25 NOTE — Patient Outreach (Signed)
  Care Coordination TOC Note Transition Care Management Follow-up Telephone Call Date of discharge and from where: 07/23/22-[place] Dx: "acute resp failure, hypoxia" How have you been since you were released from the hospital? Patient reports he is doing fairly well. His coughing has improved. He is using oxygen 24/7. Pulse ox readings have been 92% or higher. He is hopeful that he will be able to become less dependent on oxygen as he continues to get better. Appetite good due to steroids. No issues with bowels/bladder.  Any questions or concerns? Yes-Patient inquiring about getting his VA/disability paperwork completed by physicians. Provided info to pt. He has upcoming appt and encouraged to take paperwork to appt to leave for staff to complete.  Items Reviewed: Did the pt receive and understand the discharge instructions provided? Yes  Medications obtained and verified?  Patient reports he just went over meds with PCP during appt yesterday and during call with [facility] team member Other? Yes  Any new allergies since your discharge? No  Dietary orders reviewed? Yes Do you have support at home? Yes   Home Care and Equipment/Supplies: Were home health services ordered? not applicable If so, what is the name of the agency? N/A  Has the agency set up a time to come to the patient's home? not applicable Were any new equipment or medical supplies ordered?  Yes: oxygen What is the name of the medical supply agency? [employer] Were you able to get the supplies/equipment? yes Do you have any questions related to the use of the equipment or supplies? No  Functional Questionnaire: (I = Independent and D = Dependent) ADLs: I  Bathing/Dressing- I  Meal Prep- I  Eating- I  Maintaining continence- I  Transferring/Ambulation- I  Managing Meds- I  Follow up appointments reviewed:  PCP Hospital f/u appt confirmed? Yes  Saw PCP on 07/24/22. [facility] f/u appt confirmed? Yes   Scheduled to see [name] on 07/29/22 @ 4:30pm. Are transportation arrangements needed? No  If their condition worsens, is the pt aware to call PCP or go to the Emergency Dept.? Yes Was the patient provided with contact information for the PCP's office or ED? Yes Was to pt encouraged to call back with questions or concerns? Yes  SDOH assessments and interventions completed:   Yes SDOH Interventions Today    Flowsheet Row Most Recent Value  SDOH Interventions   Food Insecurity Interventions Intervention Not Indicated  Transportation Interventions Intervention Not Indicated       Care Coordination Interventions:  Education provided    Encounter Outcome:  Pt. Visit Completed    Enzo Montgomery, RN,BSN,CCM [place] Management Telephonic Care Management Coordinator Direct Phone: [phone] Toll Free: [phone] Fax: [phone]

## 2022-07-25 NOTE — Patient Outreach (Signed)
  Care Coordination TOC Note Transition Care Management Unsuccessful Follow-up Telephone Call  Date of discharge and from where:  07/23/22-[place]  Attempts:  1st Attempt  Reason for unsuccessful TCM follow-up call:  No answer/busy     Enzo Montgomery, RN,BSN,CCM [place] Management Telephonic Care Management Coordinator Direct Phone: [phone] Toll Free: [phone] Fax: [phone]

## 2022-07-26 ENCOUNTER — Other Ambulatory Visit (HOSPITAL_COMMUNITY): Payer: Self-pay

## 2022-07-26 NOTE — Telephone Encounter (Signed)
Received notification from [facility] regarding a prior authorization for PIRFENIDONE. Authorization has been APPROVED from 08/12/2021 to 08/12/2023. Approval letter sent to scan center.  Per test claim, copay for 30 days supply is $221.12  Patient can fill through Home: [phone]   Authorization # [id number] Phone: [phone]  Pending OV from 07/29/2022  Knox Saliva, PharmD, MPH, BCPS, CPP Clinical Pharmacist (Rheumatology and Pulmonology)

## 2022-07-29 ENCOUNTER — Encounter: Payer: Self-pay | Admitting: Internal Medicine

## 2022-07-29 ENCOUNTER — Telehealth: Payer: Self-pay

## 2022-07-29 ENCOUNTER — Telehealth: Payer: Self-pay | Admitting: Internal Medicine

## 2022-07-29 ENCOUNTER — Ambulatory Visit: Payer: Medicare HMO | Admitting: Internal Medicine

## 2022-07-29 ENCOUNTER — Other Ambulatory Visit (INDEPENDENT_AMBULATORY_CARE_PROVIDER_SITE_OTHER): Payer: Medicare HMO

## 2022-07-29 VITALS — BP 120/64 | HR 84 | Wt 160.8 lb

## 2022-07-29 DIAGNOSIS — D471 Chronic myeloproliferative disease: Secondary | ICD-10-CM

## 2022-07-29 DIAGNOSIS — R053 Chronic cough: Secondary | ICD-10-CM

## 2022-07-29 DIAGNOSIS — J849 Interstitial pulmonary disease, unspecified: Secondary | ICD-10-CM

## 2022-07-29 DIAGNOSIS — Z01811 Encounter for preprocedural respiratory examination: Secondary | ICD-10-CM

## 2022-07-29 DIAGNOSIS — Z9109 Other allergy status, other than to drugs and biological substances: Secondary | ICD-10-CM | POA: Diagnosis not present

## 2022-07-29 DIAGNOSIS — R634 Abnormal weight loss: Secondary | ICD-10-CM

## 2022-07-29 DIAGNOSIS — Z7729 Contact with and (suspected ) exposure to other hazardous substances: Secondary | ICD-10-CM | POA: Diagnosis not present

## 2022-07-29 DIAGNOSIS — R0902 Hypoxemia: Secondary | ICD-10-CM | POA: Diagnosis not present

## 2022-07-29 LAB — CBC
HCT: 37.1 % — ABNORMAL LOW (ref 39.0–52.0)
Hemoglobin: 11.7 g/dL — ABNORMAL LOW (ref 13.0–17.0)
MCHC: 31.7 g/dL (ref 30.0–36.0)
MCV: 92.1 fl (ref 78.0–100.0)
Platelets: 195 10*3/uL (ref 150.0–400.0)
RBC: 4.02 Mil/uL — ABNORMAL LOW (ref 4.22–5.81)
RDW: 27.2 % — ABNORMAL HIGH (ref 11.5–15.5)
WBC: 25.4 10*3/uL (ref 4.0–10.5)

## 2022-07-29 NOTE — Telephone Encounter (Signed)
[name] in theoffice he and his wife said that nintedanib is $2000 per month.  I did not see this message about pirfenidone being $221 per month.  Please let him know.  I am in favor of him starting the antifibrotic while he is going through his VA benefit application

## 2022-07-29 NOTE — Patient Instructions (Addendum)
ILD (interstitial lung disease) ([place]) Long-term exposure involving bird droppings   - Our working diagnosis is chronic HP based on bird exposure and CT scan. But your agent orange exposure puts you at  risk for ILD  - it is progressive; 25% decline in lung function in 5 years - dec 2023 admission for ILD flare up v Diast CHF with cellulitis -Currently having exertional hypoxemia that is a new development in the last couple of months -Glad you have remove the bird from your house  Plan -No bird exposure ever -CMA to do an order for portable oxygen system  -Use portable oxygen with exertion greater than 50 feet -Continue nighttime oxygen 2 L  -CMA to request DME to connect to nighttime oxygen with CPAP -Take a letter from me to the [facility] for your qualification for VA benefits -recommend nintedanib for progressive phenotype pulmonary fibrosis  -Too bad it is expensive and hopefully if you get back VA benefits he will qualify for it -Complete current prednisone taper and Bactrim given in the hospital  Chronic cough History of dust mite allergy 2018 Runny nose   -The chronic cough can be because of runny nose and dry nose -Also the bird exposure could be contributing to the cough - glad you got rid of the bird -December 2023 allergy panel positive for dust mite, weeed and also treed  Plan  - No bird in the house =-Use saline nasal spray [buy it over-the-counter] = Depending on the course next visit we can consider nasal steroid versus allergy referral  Preoperative respiratory assessment for -anal surgery  Plan  - Given recent cellulitis and respiratory admission I recommend deferring surgery for at least until August 26, 2022  Unintentional weight loss   Plan  - monitor  Follow-up - Face-to-face visit with nurse practitioner in 3 weeks -Spirometry and DLCO in 6 weeks and face-to-face visit with [name] in 6 weeks

## 2022-07-29 NOTE — Telephone Encounter (Signed)
Lab called and reported critical white count of 25.4 please advise

## 2022-07-29 NOTE — Telephone Encounter (Signed)
April  Letter to te be [facility] Disability eval  Re [name] with date of birth : 1940/08/13  To Whom It May Concern  THe above named patient is under my care. 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 He has Pulmonary Fibrosis that is progressive. Otherwise called ILD; interstitial lung disease. He is now on oxygen. Please consider this in his service connected disability application

## 2022-07-29 NOTE — Telephone Encounter (Addendum)
Noted, history of myeloproliferative disorder and leukocytes have improved from previous readings (36.2 on 12/12)

## 2022-07-29 NOTE — Progress Notes (Signed)
OV 04/30/2022 -transferring from Dr. Christinia Gully to [name] for ILD.  Subjective:  Patient ID: [name], male , DOB: Jul 01, 1941 , age 81 y.o. , MRN: [id number] , ADDRESS: [place] [address] PCP [name], MD Patient Care Team: [name], MD as PCP - General (Family Medicine) [name], MD as PCP - Cardiology (Cardiology)  This Provider for this visit: Treatment Team:  Attending Provider: Brand Males, MD    04/30/2022 -   Chief Complaint  Patient presents with   Consult    Dry cough and wheezing at times      HPI [name] 81 y.o. - referred by DR Bo Merino over concerns of ILD.  He saw rheumatology Dr. Lorelee Cover on 03/28/2022.  This is because of stiffness in his joints neck and back..  Diagnosed with degenerative disc disease and osteoarthritis.  In July 2023 he got COVID 19.  Treated as outpatient.  After that he started having some shortness of breath.  When rheumatology reviewed this history in August 2023 they note his previous reports of ILD on CT scan of the chest particularly high-res April 2018.  Last pulmonary function test was in December 2020.  Autoimmune labs were ordered.  This is come back normal and this referral was made.  Therefore is here.  To me he is reporting only cough.  He has chronic cough for the last 5 years.  There are some wheezing with a cough.  He does cough at night he does bring up some sputum that is clear or light yellow.  For the last 1 week his voice is strained.  He has never had hemoptysis.  He does cough when he lies down.  It does affect his voice.  He does clear the throat.  There is no tickle in the back of the throat.  There is barely any shortness of breath except   [facility] Comprehensive ILD Questionnaire  Symptoms:  Reports chronic cough for the last 5 years.    Past Medical History :  = He has a history of prostate cancer that is early - He  has seen Dr. Melvern Sample for low platelets and apparently this resolved - he has chronic mild anemia 12gm% since sept 2023 - MI in 2008 and status post stents - He has chronic cough and is on PPI and H2 blockade.  But he says he does not have any active acid reflux - She did check he has for rheumatoid arthritis but rheumatology notes indicate DJD - He does have sleep apnea uses CPAP. - She had infectious mononucleosis in college in 1960s - She did mark positive for COVID vaccine and COVID disease in July 2023.  He was not hospitalized for COVID -     DIAGNOSIS: OCt 2023 1) myeloproliferative disorder presented [name] leukocytosis with positive JAK2 mutation  with high WBC and splenomegaly - Rx hydroyurea OCt 2023 2) prostate adenocarcinoma currently under the care of Dr. Delrae Sawyers 3) JAK2 mutation was reported to be abnormal consistent with myeloproliferative disorde . FISH study for BCR/ABL was negative.       ROS:  -Positive for fatigue - Positive for arthralgia - 10 pound unintentional weight loss in the last 6 months because of wife cardiac illness.  But he is feeling better because of this - Nonspecific rash  FAMILY HISTORY of LUNG DISEASE:  -Both parents had COPD.  Mother had asthma.  Son at sarcoid.  Rest  of family history is negative.*  PERSONAL EXPOSURE HISTORY:  -Never smoked cigarettes but he did do some passive smoking in the past.  No cigar use no marijuana use no pipe use.  No cocaine use no intravenous drug use  HOME  EXPOSURE and HOBBY DETAILS :  -Lives in a 79 year old home for the last 15 years.  He does have a [place] when he goes on vacation but this only once a year.  He does have a cockatiel bird in his office for the last 20 years.  He spent several hours in a week in that office.  He also cleans the droppings and feathers from the bird cage.  There is no mold anywhere in the system.  He plays a Microbiologist harmonica there is no mold or mildew in  this.  OCCUPATIONAL HISTORY (122 questions) : -He wants rental property.  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  Other than that he has a bird exposure.  -0 he also has a [place] War veteran and is believed to have been exposed to agent orange.  PULMONARY TOXICITY HISTORY (27 items):  -Denies  INVESTIGATIONS: Below Pulmonary function test shows progression between 22018 and 2020  High-resolution CT chest from several years ago in 2018 nonspecific ILD changes but seems more prominent on the CT abdomen lung cuts August 2022.  [Personally visualized        CT Chest data abd lung image Aug 2022  IMPRESSION: 1. No non-contrast CT findings of the abdomen or pelvis to explain right lower quadrant pain. No evidence of abdominal hernia. 2. Normal appendix. 3. No evidence of urinary tract calculus or hydronephrosis. 4. Mild prostatomegaly with thickening of the urinary bladder wall, likely related to chronic outlet obstruction. 5. Moderate fibrotic scarring of the included bilateral lung bases, which appears somewhat increased compared to prior examination 05/19/2017. Consider pulmonary referral and dedicated chest interstitial lung disease CT of the chest to further evaluate. 6. Coronary artery disease.   Aortic Atherosclerosis (ICD10-I70.0).     Electronically Signed   By: Eddie Candle M.D.   On: 04/02/2021 13:05  No results found.        OV 06/25/2022  Subjective:  Patient ID: [name], male , DOB: 1941/02/07 , age 34 y.o. , MRN: [id number] , ADDRESS: [place] [address] PCP [name], MD Patient Care Team: [name], MD as PCP - General (Family Medicine) [name], MD as PCP - Cardiology (Cardiology)  This Provider for this visit: Treatment Team:  Attending Provider: Brand Males, MD    06/25/2022 -    Chief Complaint  Patient presents with   Follow-up    Pt states he has had a cough that is not going away and also states that he has had postnasal drainage. States his cough is worse in the mornings and also at night when laying down for bed. States he will cough up a lot of clear phlegm in the mornings.   History of sleep apnea on CPAP [Dr. Elisabeth Cara Sood]  Follow-up interstitial lung disease: Progressive phenotype [IPF versus chronic HP)  -Features of IPF include age greater than 2, Caucasian, male gender, occupation Dispensing optician work  -Features for chronic HP include: Having a Loss adjuster, chartered bird in the house.  HPI [name] 81 y.o. -returns for follow-up.  He is here to discuss  his work-up.  His high-resolution CT chest is described as definitive UIP and with definitive progression.  There is also progression his pulmonary function test.  25% decline in the last 5 years averaging 5% decline in FVC per year.  His serology is negative.  This along with age greater than 31, Velcro crackles in the lung base, white male would make him have a diagnosis of IPF.  However on my personal visualization while I do agree with progression I am not sure about the definite of UIP features.  In addition there is?  Specks of pleural calcification.  This would raise the question of asbestosis in the context of exposure of bird question of hypersensitive pneumonitis is there.  I have therefore sought the opinion of Dr. Yetta Glassman radiologist via email.  There are several issues to deal with at this visit  -New diagnosis of myeloproliferative disorder with JAK2 mutation October 2023 by Dr. [name].  Started on hydroxyurea.  Sophia is tolerating it well.  Is been on it for 3 weeks.  No nausea but has been given some Zofran for this or Compazine.  -He is also having epistaxis through the right nostril for the last several months.  At least 1 time per week.  It is intermittent.  Sometimes it is every other week.  No  specific trigger factor other than the fact his nose does feel dry with the CPAP.  He does not use any saline nasal spray.  But it is definitely new.  He is on aspirin.  -Cough: This is worse compared to the last visit.  It is there early in the morning and later in the evening.  Associated with sinus drainage.  He is on chlorpheniramine tablets but it is not working.  In the past he has had RAST allergy profile his blood eosinophils were normal.  He does have some house dust mite allergy and that was the only positive test but otherwise normal.  He does take some cough drops and it does help.  He also feels the cough is stuck in the throat and associated with sinus drainage.  He has never seen the ENT doctor  -Shortness of breath: He had new onset insidious onset of shortness of breath with exertion relieved by rest.  Severe it is listed below.  It is not the last visit.  No associated chest pain  -Unintentional weight loss for the last 6 months of at least 10 pounds.  Current weight is 164 pounds.   HRCT 05/04/22  Narrative & Impression  CLINICAL DATA:  Interstitial lung disease.   EXAM: CT CHEST WITHOUT CONTRAST   TECHNIQUE: Multidetector CT imaging of the chest was performed following the standard protocol without intravenous contrast. High resolution imaging of the lungs, as well as inspiratory and expiratory imaging, was performed.   RADIATION DOSE REDUCTION: This exam was performed according to the departmental dose-optimization program which includes automated exposure control, adjustment of the mA and/or kV according to patient size and/or use of iterative reconstruction technique.   COMPARISON:  11/26/2016.   FINDINGS: Cardiovascular: Atherosclerotic calcification of the aorta, aortic valve and coronary arteries. Enlarged pulmonary trunk and heart. No pericardial effusion.   Mediastinum/Nodes: Mediastinal lymph nodes measure up to 10 mm in the low right paratracheal  station, as before. Hilar regions are difficult to evaluate without IV contrast. No axillary adenopathy. Esophagus is grossly unremarkable.   Lungs/Pleura: Peripheral and basilar predominant subpleural reticulation, traction bronchiectasis/bronchiolectasis, ground-glass and single layer honeycombing. Findings have  progressed from 11/26/2016. Calcified granulomas. No suspicious pulmonary nodules. No pleural fluid. Airway is unremarkable. Minimal trapping.   Upper Abdomen: Right hemidiaphragm is elevated. 10 mm low-attenuation lesion in the right hepatic lobe, too small to characterize but likely a cyst. Visualized portions of the liver, gallbladder, adrenal glands and right kidney are unremarkable. Spleen appears enlarged but is incompletely imaged. Visualized portions of the pancreas, stomach and bowel are unremarkable with the exception of a small hiatal hernia. No upper abdominal adenopathy.   Musculoskeletal: Degenerative changes in the spine. No worrisome lytic or sclerotic lesions.   IMPRESSION: 1. Pulmonary parenchymal pattern of fibrosis, as detailed above, progressive from 11/16/2016. Findings are consistent with UIP per consensus guidelines: Diagnosis of Idiopathic Pulmonary Fibrosis: An Official ATS/ERS/JRS/ALAT Clinical Practice Guideline. [place], Iss 5, [phone], Apr 12 2017. 2. Minimal air trapping indicative of small airways disease. 3. Spleen appears enlarged but is incompletely imaged. 4. Aortic atherosclerosis (ICD10-I70.0). Coronary artery calcification. 5. Enlarged pulmonic trunk, indicative of pulmonary arterial hypertension.     Electronically Signed   By: Lorin Picket M.D.   On: 05/06/2022 10:39    Latest Reference Range & Units 04/24/22 14:24  Anti Nuclear Antibody (ANA) NEGATIVE  NEGATIVE  Cyclic Citrullin Peptide Ab UNITS <16  ds DNA Ab IU/mL <1  RA Latex Turbid. <14 IU/mL <14  ENA SM Ab Ser-aCnc <1.0 NEG AI <1.0 NEG   C3 Complement 82 - 185 mg/dL 136  C4 Complement 15 - 53 mg/dL 44  Ribonucleic Protein(ENA) Antibody, IgG <1.0 NEG AI <1.0 NEG  SSA (Ro) (ENA) Antibody, IgG <1.0 NEG AI <1.0 NEG  SSB (La) (ENA) Antibody, IgG <1.0 NEG AI <1.0 NEG  Scleroderma (Scl-70) (ENA) Antibody, IgG <1.0 NEG AI <1.0 NEG   ECHO 05/01/22   IMPRESSIONS     1. Left ventricular ejection fraction, by estimation, is 60 to 65%. The  left ventricle has normal function. The left ventricle has no regional  wall motion abnormalities. Left ventricular diastolic parameters were  normal.   2. Right ventricular systolic function is normal. The right ventricular  size is normal. There is normal pulmonary artery systolic pressure. The  estimated right ventricular systolic pressure is [id number] mmHg.   3. The mitral valve is normal in structure. Trivial mitral valve  regurgitation. No evidence of mitral stenosis.   4. The aortic valve is tricuspid. Aortic valve regurgitation is not  visualized. Aortic valve sclerosis is present, with no evidence of aortic  valve stenosis.   5. The inferior vena cava is normal in size with greater than 50%  respiratory variability, suggesting right atrial pressure of 3 mmHg.    INPATIENT 07/21/22     81 year old male who I saw for the first time in September 2023 for interstitial lung disease.  At that time his symptoms are chronic cough there was concern of progressive phenotype because he had already had ILD.  He had a family history of COPD.  He never smoked cigarettes but did do some passive smoking.  He was in the [place] War and had agent orange exposure.  He worked in Dispensing optician.  All this along with age greater than 44 and Caucasian put him at risk for IPF.  However he did have bird exposure at this house for many years.  I saw him back in November 2023.  High-resolution CT chest thoracic radiology opinion more consistent with hypersensitive pneumonitis consistent with his bird exposure..  He has  had progression  on both CT scan of the chest and pulmonary function test [see below] just prior to Thanksgiving 2023 indicated him to get rid of the birds and consider antifibrotic nintedanib. He did get rid of the bird   However around this time he started calling into the office complaining of some mild onset hemoptysis [currently not an issue] and at this ER visit is reporting 2 to 3-week worsening dyspnea and cough and 2 days of worsening pedal edema.  He had new onset hypoxemia at rest.  Was hypoxemic requiring few liters nasal cannula.  According to the hospitalist he has responded to Lasix.  After arrival he has developed bilateral distal one third erythema on both shin along with warmth and tenderness to touch but no crepitus.  He is on Rocephin and azithromycin.   His BNP, troponin respiratory virus panel urine strep are negative his CT angiogram chest is negative for PE.  His echocardiogram shows good ejection fraction and no evidence of RV strain.  But he is definitely hypoxemic.   Pulmonary has been consulted.  Of note he is yet to start his antifibrotic.   OV 07/29/2022  Subjective:  Patient ID: [name], male , DOB: 11/27/1940 , age 63 y.o. , MRN: [id number] , ADDRESS: [place] [address] PCP [name], MD Patient Care Team: [name], MD as PCP - General (Family Medicine) [name], MD as PCP - Cardiology (Cardiology)  This Provider for this visit: Treatment Team:  Attending Provider: Brand Males, MD    07/29/2022 -   Chief Complaint  Patient presents with   Follow-up    Pt is here for follow up for ILD. Pt states he saw MR in hospital last week. Pt states he is having no energy. Pt states without oxygen his oxygen drops. Pt I s on prednsione currenlty. Pt thinks he is done with bactrim as of today.    Follow-up interstitial lung disease hypersensitive pneumonitis versus IPF (final analysis hypersensitive  pneumonitis]  -Progressive phenotype  -Bird exposure RAST allergy test + December 2023  -Dust mites, ragweed, tree  HPI [name] 80 y.o. -returns for follow-up.  I am meeting his wife [name] for the first time.  After last visit I discussed with radiologist.  CT scan was more consistent with hypersensitive pneumonitis and given his bird exposure we gave him this diagnosis and committed him to nintedanib but he got hospitalized with respiratory flareup.  Final diagnosis of volume overload diastolic heart failure versus pulmonary fibrosis flareup.  He is on a 3-week prednisone taper.  He is completed 1 week.  He has 2 more weeks left.  Is also on Bactrim for PJP prophylaxis.  In the interim he is got rid of the bird.  At the hospitalization he also had cellulitis and this is resolved.  He is much improved after the hospitalization.  He is no longer hypoxemic at rest but walking desaturation test did show hypoxemia [see below].  Other issues - We did check his RAST allergy panel and is positive for a number of environmental things.  This could partially explain his cough - Cellulitis: Resolved - Treatment: He his insurance is not covering to cover his nintedanib co-pay.  It is extremely expensive for him.  His income level is above the level for charity.  He is applied for VA benefits he might qualify.  He wants me to do a letter that he has ILD.  This will be to the  [facility] -Weight loss: Last visit he had weight loss this needs to be tracked -Preoperative respiratory assessment: He has upcoming anal surgery.  This is later this month.  I did advise him that given cellulitis and recent respiratory exacerbation to wait at least 4 weeks from the time of admission before he can have surgery. - Logistics: He wants his DME company notified so he can bleed his oxygen through the CPAP at night and he also wants a portable unit.     Filed Weights   07/29/22 1624  Weight: 160 lb 12.8 oz  (72.9 kg)       SYMPTOM SCALE - ILD 04/30/2022 06/25/2022 164 pounds 07/29/2022 160# - recent admission dec 2023 for ILD flare v d-cHF and cellulitis  Current weight     O2 use ra ra   Shortness of Breath 0 -> 5 scale with 5 being worst (score 6 If unable to do)    At rest 0 0   Simple tasks - showers, clothes change, eating, shaving 0 1   Household (dishes, doing bed, laundry) 0 0   Shopping 0 0   Walking level at own pace 0 1   Walking up Stairs 0 1   Total (30-36) Dyspnea Score 0 3       Non-dyspnea symptoms (0-> 5 scale) 04/30/2022 06/25/2022   How bad is your cough? 2 3  How bad is your fatigue 2 2  How bad is nausea 0 0  How bad is vomiting?  0 0  How bad is diarrhea? 0 0  How bad is anxiety? 0 0  How bad is depression 0 0  Any chronic pain - if so where and how bad 2 0       Simple office walk 185 feet x  3 laps goal with forehead probe 04/30/2022  07/29/2022   O2 used ra ra  Number laps completed 3 1 of 3  Comments about pace avg avg  Resting Pulse Ox/HR 98% and 75/min 94% and HR 88  Final Pulse Ox/HR 95% and 99/min 86% and  HR 97  Desaturated </= 88% no yes  Desaturated <= 3% points yes yes  Got Tachycardic >/= 90/min yes yes  Symptoms at end of test non none  Miscellaneous comments x WORSE     PFT     Latest Ref Rng & Units 06/05/2022    3:46 PM 07/29/2019    8:58 AM 10/17/2016    9:59 AM  PFT Results  FVC-Pre L 2.01  2.57  2.73   FVC-Predicted Pre % 56  70  73   FVC-Post L 2.01  2.60  2.76   FVC-Predicted Post % 56  71  74   Pre FEV1/FVC % % 87  87  88   Post FEV1/FCV % % 92  90  87   FEV1-Pre L 1.74  2.23  2.39   FEV1-Predicted Pre % 69  85  89   FEV1-Post L 1.85  2.34  2.40   DLCO uncorrected ml/min/mmHg 11.37  15.24  16.71   DLCO UNC% % 51  68  60   DLCO corrected ml/min/mmHg 12.63   16.07   DLCO COR %Predicted % 57   58   DLVA Predicted % 91  98  90   TLC L 3.65  3.89  4.37   TLC % Predicted % 57  61  69   RV % Predicted % 61   53  59  has a past medical history of Abnormal chest x-ray, Allergic rhinitis, Ankylosing spondylitis ([place]), Anxiety, Arthritis, Colonic polyp, Coronary atherosclerosis of native coronary artery, Cough, Dizziness (10/21/2016), Ganglion cyst of wrist, Heart attack ([place]) (2008), Hemorrhoids, History of malaria, Hyperlipidemia, Hypertension, Kidney stone, Other disorder of muscle, ligament, and fascia, Rotator cuff tear, and Swelling of lower extremity (10/16/2015).   reports that he has never smoked. He has never been exposed to tobacco smoke. He has never used smokeless tobacco.  Past Surgical History:  Procedure Laterality Date   bicep rupture     CORONARY ANGIOPLASTY WITH STENT PLACEMENT     HEMORRHOID SURGERY  05/2009   in [facility]   LEFT HEART CATH AND CORONARY ANGIOGRAPHY N/A 01/14/2017   Procedure: Left Heart Cath and Coronary Angiography;  Surgeon: [name], MD;  Location: [facility];  Service: Cardiovascular;  Laterality: N/A;   LEFT HEART CATHETERIZATION WITH CORONARY ANGIOGRAM N/A 09/03/2012   Procedure: LEFT HEART CATHETERIZATION WITH CORONARY ANGIOGRAM;  Surgeon: [name], MD;  Location[facility];  Service: Cardiovascular;  Laterality: N/A;   PROSTATE BIOPSY     ROTATOR CUFF REPAIR     bilat by Dr. French Ana    Allergies  Allergen Reactions   Celebrex [Celecoxib]     Pt states precipitated his heart attack.   Tramadol Other (See Comments)    Makes patient jumpy    Immunization History  Administered Date(s) Administered   Fluad Quad(high Dose 65+) 05/01/2019, 05/28/2021, 05/01/2022   Influenza Split 06/01/2012   Influenza Whole 05/30/2009, 04/20/2010   Influenza, High Dose Seasonal PF 05/19/2017, 05/19/2018   Influenza-Unspecified 05/13/2007, 07/12/2013, 05/10/2016, 06/23/2020   PFIZER(Purple Top)SARS-COV-2 Vaccination 09/18/2019, 10/09/2019, 07/13/2020   PNEUMOCOCCAL CONJUGATE-20 01/14/2022   Pneumococcal Conjugate-13 05/19/2018    Pneumococcal Polysaccharide-23 08/22/2009   Tdap 12/04/2010, 06/04/2011, 07/31/2018   Zoster Recombinat (Shingrix) 01/14/2022   Zoster, Live 05/17/2008   Zoster, Unspecified 04/17/2022    Family History  Problem Relation Age of Onset   Cancer Mother        melanoma   Emphysema Mother    Rheum arthritis Mother    Diabetes Mother    Alcohol abuse Father      Current Outpatient Medications:    acetaminophen (TYLENOL) 325 MG tablet, Take 650 mg by mouth every 6 (six) hours as needed for mild pain or moderate pain., Disp: , Rfl:    amLODipine (NORVASC) 2.5 MG tablet, Take 1 tablet (2.5 mg total) by mouth daily., Disp: 90 tablet, Rfl: 1   Apoaequorin (PREVAGEN PO), Take 1 tablet by mouth daily. , Disp: , Rfl:    aspirin 81 MG EC tablet, Take 81 mg by mouth daily., Disp: , Rfl:    budesonide-formoterol (SYMBICORT) 80-4.5 MCG/ACT inhaler, Inhale 2 puffs into the lungs 2 (two) times daily. (Patient taking differently: Inhale 2 puffs into the lungs as needed.), Disp: 1 each, Rfl: 3   Cholecalciferol (VITAMIN D3) 1.25 MG (50000 UT) CAPS, Take 1 capsule by mouth daily. Pt does not know dose., Disp: , Rfl:    guaiFENesin (MUCINEX) 600 MG 12 hr tablet, Take 2 tablets (1,200 mg total) by mouth 2 (two) times daily for 7 days., Disp: 28 tablet, Rfl: 0   hydroxyurea (HYDREA) 500 MG capsule, Take 1 capsule (500 mg total) by mouth daily. May take with food to minimize GI side effects. (Patient taking differently: Take 500 mg by mouth daily. May take with food to minimize GI side effects.  (At 7:00pm)), Disp: 30 capsule, Rfl:  3   Multiple Vitamins-Minerals (ICAPS AREDS 2) CAPS, Take by mouth daily., Disp: , Rfl:    pantoprazole (PROTONIX) 40 MG tablet, Take 1 tablet (40 mg total) by mouth 2 (two) times daily., Disp: 60 tablet, Rfl: 0   predniSONE (DELTASONE) 20 MG tablet, Take 3 tablets (60 mg total) by mouth daily with breakfast for 6 days., Disp: 18 tablet, Rfl: 0   [START ON 07/30/2022] predniSONE  (DELTASONE) 20 MG tablet, Take 2 tablets (40 mg total) by mouth daily with breakfast for 7 days., Disp: 14 tablet, Rfl: 0   [START ON 08/06/2022] predniSONE (DELTASONE) 20 MG tablet, Take 1 tablet (20 mg total) by mouth daily with breakfast for 7 days., Disp: 7 tablet, Rfl: 0   prochlorperazine (COMPAZINE) 10 MG tablet, Take 1 tablet (10 mg total) by mouth every 6 (six) hours as needed for nausea or vomiting., Disp: 30 tablet, Rfl: 0   simvastatin (ZOCOR) 20 MG tablet, TAKE 1 TABLET EVERY DAY WITH BREAKFAST (APPOINTMENT IS NEEDED FOR REFILLS) (Patient taking differently: Take 20 mg by mouth daily. WITH BREAKFAST (APPOINTMENT IS NEEDED FOR REFILLS)), Disp: 90 tablet, Rfl: 3   sulfamethoxazole-trimethoprim (BACTRIM DS) 800-160 MG tablet, Take 1 tablet by mouth 3 (three) times a week for 23 days., Disp: 10 tablet, Rfl: 0   UNABLE TO FIND, Med Name: CPAP per Dr Halford Chessman, Disp: , Rfl:    valACYclovir (VALTREX) 500 MG tablet, Take 1 tablet (500 mg total) by mouth 2 (two) times daily. Take at 1st sign of penile rash., Disp: 6 tablet, Rfl: 1      Objective:   Vitals:   07/29/22 1624  BP: 120/64  Pulse: 84  SpO2: 94%  Weight: 160 lb 12.8 oz (72.9 kg)    Estimated body mass index is 25.18 kg/m as calculated from the following:   Height as of 07/24/22: '5\' 7"'$  (1.702 m).   Weight as of this encounter: 160 lb 12.8 oz (72.9 kg).  '@WEIGHTCHANGE'$ @  Filed Weights   07/29/22 1624  Weight: 160 lb 12.8 oz (72.9 kg)     Physical Exam  General: No distress. Looks well Neuro: Alert and Oriented x 3. GCS 15. Speech normal Psych: Pleasant Resp:  Barrel Chest - no.  Wheeze - no, Crackles - yes upper lobe, No overt respiratory distress CVS: Normal heart sounds. Murmurs - no Ext: Stigmata of Connective Tissue Disease - no HEENT: Normal upper airway. PEERL +. No post nasal drip        Assessment:       ICD-10-CM   1. ILD (interstitial lung disease) ([place])  J84.9 Pulmonary Function Test    Ambulatory  Referral for DME    2. Chronic cough  R05.3 Ambulatory referral to ENT    Pulmonary Function Test    3. Exercise hypoxemia  R09.02     4. Long-term exposure involving bird droppings  Z77.29     5. Weight loss, unintentional  R63.4     6. House dust mite allergy  Z91.09     7. Pre-operative respiratory examination  Z01.811          Plan:     Patient Instructions  ILD (interstitial lung disease) ([place]) Long-term exposure involving bird droppings   - Our working diagnosis is chronic HP based on bird exposure and CT scan. But your agent orange exposure puts you at  risk for ILD  - it is progressive; 25% decline in lung function in 5 years - dec 2023 admission for ILD  flare up v Diast CHF with cellulitis -Currently having exertional hypoxemia that is a new development in the last couple of months -Glad you have remove the bird from your house  Plan -No bird exposure ever -CMA to do an order for portable oxygen system  -Use portable oxygen with exertion greater than 50 feet -Continue nighttime oxygen 2 L  -CMA to request DME to connect to nighttime oxygen with CPAP -Take a letter from me to the [facility] for your qualification for VA benefits -recommend nintedanib for progressive phenotype pulmonary fibrosis  -Too bad it is expensive and hopefully if you get back VA benefits he will qualify for it -Complete current prednisone taper and Bactrim given in the hospital  Chronic cough History of dust mite allergy 2018 Runny nose   -The chronic cough can be because of runny nose and dry nose -Also the bird exposure could be contributing to the cough - glad you got rid of the bird -December 2023 allergy panel positive for dust mite, weeed and also treed  Plan  - No bird in the house =-Use saline nasal spray [buy it over-the-counter] = Depending on the course next visit we can consider nasal steroid versus allergy referral  Preoperative respiratory assessment for -anal  surgery  Plan  - Given recent cellulitis and respiratory admission I recommend deferring surgery for at least until August 26, 2022  Unintentional weight loss   Plan  - monitor  Follow-up - Face-to-face visit with nurse practitioner in 3 weeks -Spirometry and DLCO in 6 weeks and face-to-face visit with [name] in 6 weeks   ( Level 05 visit: Estb 40-54 min   in  visit type: on-site physical face to visit  in total care time and counseling or/and coordination of care by this undersigned MD - Dr Brand Males. This includes one or more of the following on this same day 07/29/2022: pre-charting, chart review, note writing, documentation discussion of test results, diagnostic or treatment recommendations, prognosis, risks and benefits of management options, instructions, education, compliance or risk-factor reduction. It excludes time spent by the [place] or office staff in the care of the patient. Actual time 67 min)    SIGNATURE    Dr. Brand Males, M.D., F.C.C.P,  Pulmonary and Critical Care Medicine Staff Physician, [place] Director - Interstitial Lung Disease  Program  Pulmonary [place] at [place], [place], [address]  Pager: [phone], If no answer or between  15:00h - 7:00h: call [phone] Telephone: [phone]  5:23 PM 07/29/2022

## 2022-07-30 ENCOUNTER — Telehealth: Payer: Self-pay | Admitting: Internal Medicine

## 2022-07-30 NOTE — Telephone Encounter (Signed)
Spoke to pt and he states a Dr from the [facility]. sent in prednisone to the pt's pharmacy and the way the script reads is 3 (60 MG) tabs for 7 day 2 (40 MG) tabs for 7 days and 1 (20 MG) tab for 7 days. Pt received 2 rounds already but is afraid he wouldn't get his last round of prednisone and pharmacy wasn't able to review last script. I called pharmacy and they informed me they have the script it's not dated and hasn't been filled due to insurance. Once ins. releases the script to be filled pt will be able to get his medicine. I formed pt of this information. Pt verbalized understanding and nothing further is needed.

## 2022-07-30 NOTE — Telephone Encounter (Signed)
Called and left voicemail for him to come pick up letter. Nothing further needed

## 2022-07-30 NOTE — Telephone Encounter (Signed)
PT was given 3 rounds ( RX) of Pred when he was discharged. Pharmacy can not find third round of Pred he is weaning off of. Please call to advise. PT call back # is [phone]  (Take 1 tab '20mg'$  for 7 days is the  missing one. Starts Dec 26th. He'd like to have it filled today as Ins will not fill all at once. Will set this to High Priority due to time restraints. )   [facility] on [address]

## 2022-07-31 NOTE — Telephone Encounter (Signed)
Looks like his messages again came to our inbasket when they are meant for Dr Alfonso Patten

## 2022-08-01 ENCOUNTER — Encounter: Payer: Self-pay | Admitting: Internal Medicine

## 2022-08-08 ENCOUNTER — Telehealth: Payer: Self-pay | Admitting: Internal Medicine

## 2022-08-08 ENCOUNTER — Telehealth: Payer: Self-pay | Admitting: Cardiovascular Disease

## 2022-08-08 NOTE — Telephone Encounter (Signed)
Pt calling to f/u on forms that were signed last week. He would like a callback as to when they are able to be picked up. Please advise

## 2022-08-08 NOTE — Telephone Encounter (Signed)
Called patient and he states that [employer] is needing a new CPAP order. And I looked on [employer] an no recent download is available due to last update 11/2020. But It looks like his current settings are 5-20 with heated humidity. He has a new order for 2L of oxygen to be bled into cpap machine but [facility] is needing new cpap order.  Sir are you ok if I place a new cpap order for the settings of auto cpap 5-20. I told patient that he does need to come in for an follow up for his OSA.   Please advise sir

## 2022-08-08 NOTE — Telephone Encounter (Signed)
Called pt back in regards to forms.  Reports came by front desk one day last week filled out release form to have records sent to the [place].  Was told that would be called once paperwork is ready.  Pt concerned because has not received a call.  Advised pt d/t the holidays and our office closing may be cause of delay.  I will speak with front desk staff to f/u.

## 2022-08-08 NOTE — Telephone Encounter (Signed)
Patient will be coming by to sign a new release releasing his records to himself.  Medical Records dept will e-mail the records to him.

## 2022-08-09 ENCOUNTER — Encounter (HOSPITAL_BASED_OUTPATIENT_CLINIC_OR_DEPARTMENT_OTHER): Payer: Self-pay

## 2022-08-09 ENCOUNTER — Ambulatory Visit (HOSPITAL_BASED_OUTPATIENT_CLINIC_OR_DEPARTMENT_OTHER): Admit: 2022-08-09 | Payer: Medicare HMO | Admitting: Surgery

## 2022-08-09 SURGERY — EXAM UNDER ANESTHESIA WITH HEMORRHOIDECTOMY
Anesthesia: Monitor Anesthesia Care

## 2022-08-09 NOTE — Telephone Encounter (Signed)
His sleep doc is Dr Halford Chessman. Please check with DR Halford Chessman

## 2022-08-13 DIAGNOSIS — Z9981 Dependence on supplemental oxygen: Secondary | ICD-10-CM | POA: Diagnosis not present

## 2022-08-13 DIAGNOSIS — R040 Epistaxis: Secondary | ICD-10-CM | POA: Diagnosis not present

## 2022-08-13 DIAGNOSIS — H6123 Impacted cerumen, bilateral: Secondary | ICD-10-CM | POA: Diagnosis not present

## 2022-08-13 NOTE — Telephone Encounter (Signed)
Called and spoke with pt letting him know that he will need to have an appt for sleep consult since he has not seen a sleep provider at office since 2018. Pt verbalized understanding. Appt scheduled for pt. Nothing further needed.

## 2022-08-13 NOTE — Telephone Encounter (Signed)
We can renew any orders he already has but for new orders will need sleep medicine eval, 1st available

## 2022-08-13 NOTE — Telephone Encounter (Signed)
I haven't seen him since 2018.  He has been seen more recently by Dr. Melvyn Novas and [name].

## 2022-08-13 NOTE — Progress Notes (Unsigned)
Cardiology Office Note    Date:  08/14/2022   ID:  [name], DOB 28-Aug-1940, MRN [id number]  PCP:  [name], MD  Cardiologist:  [name], MD  Electrophysiologist:  None   Chief Complaint: post-hospital f/u recent issues with iLD  History of Present Illness:   [name] is a 82 y.o. male with history of VF cardiac arrest in 2008 with anterior STEMI/CAD s/p stent to LAD, fungal infection of groin, ILD followed by pulmonology (felt related to prior bird exposure per notes), ankylosing spondylitis, anxiety, HLD, kidney stone, myeloproliferative disorder followed by Dr. [name] who is seen for post-hospital follow-up. From cardiac standpoint, last cath 2018 showed continued patent LAD stent and unchanged severe stenosis of small diag with mild onobstructive disease of the right coronary artery and left circumflex managed medically, normal LVEDP. He was recently admitted for acute hypoxic respiratory failure felt due to acute exacerbation of ILD, bilateral LE cellulitis,  and edema. BNP and troponins were normal. He was treated with IV Lasix. RVPs neg. Echo 07/21/22 EF 60-65%, mild LVH of BSS, normal RV,aortic sclerosis without stenosis, normal IVC. CTA was negative for PE. High res CT showed extensive, acutely superimposed ground-glass and heterogeneous airspace disease, particularly in the left lung base, findings consistent with multifocal infection or aspiration, underlying pulm fibrosis. He was treated with steroids and Bactrim for PCP.  He is seen for follow-up with his wife. Overall he seems to be stable from a cardiac standpoint, but does note he continues to wear out easier than he did before recent progressive pulmonary issues. He is still requiring O2. His edema resolved. He has not had any chest pain. He had several questions including about a news article in the paper regarding statins that I did my best to answer.   Labwork independently reviewed: 07/2022 WBC  25.4, Hgb 11.7 plt ok, Mg 2.1, K 3.9, Cr 1.04, troponins and BNP wnl, LFTs ok 01/2022 LDL 45   Cardiology Studies:   Studies reviewed are outlined and summarized above. Reports may be included below. Otherwise see EMR.  Echo 07/21/22   1. Left ventricular ejection fraction, by estimation, is 60 to 65%. The  left ventricle has normal function. The left ventricle has no regional  wall motion abnormalities. There is mild left ventricular hypertrophy of  the basal-septal segment. Left  ventricular diastolic parameters are indeterminate.   2. Right ventricular systolic function is normal. The right ventricular  size is normal. Tricuspid regurgitation signal is inadequate for assessing  PA pressure.   3. The mitral valve is normal in structure. Trivial mitral valve  regurgitation. No evidence of mitral stenosis.   4. The aortic valve is tricuspid. Aortic valve regurgitation is not  visualized. Aortic valve sclerosis/calcification is present, without any  evidence of aortic stenosis. Aortic valve area, by VTI measures 2.47 cm.  Aortic valve mean gradient measures  6.0 mmHg. Aortic valve Vmax measures 1.63 m/s.   5. The inferior vena cava is normal in size with greater than 50%  respiratory variability, suggesting right atrial pressure of 3 mmHg.   Cath 2018  1. Continued patency of the LAD stented segment with no significant restenosis 2. Severe stenosis of a small diagonal branch unchanged from the previous study in 2014 3. Mild nonobstructive disease of the right coronary artery and left circumflex 4. Normal LVEDP with known normal LV systolic function by noninvasive assessment   Recommendations: Continued medical therapy. I don't think the patient's diagonal stenosis  is causing any symptoms and clearly wouldn't be related to his exertional dyspnea. It may however be the reason he has an abnormal stress ECG. Recommend ongoing medical therapy with initiation of an exercise program. The  diagonal is small and not well suited for PCI.    Past Medical History:  Diagnosis Date   Abnormal chest x-ray    Allergic rhinitis    Ankylosing spondylitis (HCC)    Anxiety    Arthritis    Colonic polyp    Coronary atherosclerosis of native coronary artery    Cough    Dizziness 10/21/2016   Ganglion cyst of wrist    left   Heart attack ([place]) 2008   Hemorrhoids    History of malaria    Hyperlipidemia    with low HDL   Hypertension    Kidney stone    Other disorder of muscle, ligament, and fascia    Rotator cuff tear    Swelling of lower extremity 10/16/2015    Past Surgical History:  Procedure Laterality Date   bicep rupture     CORONARY ANGIOPLASTY WITH STENT PLACEMENT     HEMORRHOID SURGERY  05/2009   in [place]   LEFT HEART CATH AND CORONARY ANGIOGRAPHY N/A 01/14/2017   Procedure: Left Heart Cath and Coronary Angiography;  Surgeon: [name], MD;  Location: [place] CV [facility];  Service: Cardiovascular;  Laterality: N/A;   LEFT HEART CATHETERIZATION WITH CORONARY ANGIOGRAM N/A 09/03/2012   Procedure: LEFT HEART CATHETERIZATION WITH CORONARY ANGIOGRAM;  Surgeon: [name], MD;  Location: [facility] CATH LAB;  Service: Cardiovascular;  Laterality: N/A;   PROSTATE BIOPSY     ROTATOR CUFF REPAIR     bilat by Dr. French Ana    Current Medications: Current Meds  Medication Sig   acetaminophen (TYLENOL) 325 MG tablet Take 650 mg by mouth every 6 (six) hours as needed for mild pain or moderate pain.   amLODipine (NORVASC) 2.5 MG tablet Take 1 tablet (2.5 mg total) by mouth daily.   Apoaequorin (PREVAGEN PO) Take 1 tablet by mouth daily.    aspirin 81 MG EC tablet Take 81 mg by mouth daily.   budesonide-formoterol (SYMBICORT) 80-4.5 MCG/ACT inhaler Inhale 2 puffs into the lungs 2 (two) times daily.   Cholecalciferol (VITAMIN D3) 1.25 MG (50000 UT) CAPS Take 1 capsule by mouth daily. Pt does not know dose.   famotidine (PEPCID) 20 MG tablet Take 20 mg by mouth daily.    hydroxyurea (HYDREA) 500 MG capsule Take 1 capsule (500 mg total) by mouth daily. May take with food to minimize GI side effects.   Multiple Vitamins-Minerals (ICAPS AREDS 2) CAPS Take by mouth daily.   pantoprazole (PROTONIX) 40 MG tablet Take 1 tablet (40 mg total) by mouth 2 (two) times daily.   simvastatin (ZOCOR) 20 MG tablet TAKE 1 TABLET EVERY DAY WITH BREAKFAST (APPOINTMENT IS NEEDED FOR REFILLS)   UNABLE TO FIND Med Name: CPAP per Dr Halford Chessman      Allergies:   Celecoxib and Tramadol   Social History   Socioeconomic History   Marital status: Married    Spouse name: [name]   Number of children: 2   Years of education: Not on file   Highest education level: Not on file  Occupational History   Not on file  Tobacco Use   Smoking status: Never    Passive exposure: Never   Smokeless tobacco: Never  Vaping Use   Vaping Use: Never used  Substance and Sexual Activity  Alcohol use: Yes    Comment: holiday Rare    Drug use: No   Sexual activity: Not Currently  Other Topics Concern   Not on file  Social History Narrative   Not on file   Social Determinants of Health   Financial Resource Strain: Low Risk  (04/03/2022)   Overall Financial Resource Strain (CARDIA)    Difficulty of Paying Living Expenses: Not hard at all  Food Insecurity: No Food Insecurity (07/25/2022)   Hunger Vital Sign    Worried About Running Out of Food in the Last Year: Never true    Ran Out of Food in the Last Year: Never true  Transportation Needs: No Transportation Needs (07/25/2022)   PRAPARE - Hydrologist (Medical): No    Lack of Transportation (Non-Medical): No  Physical Activity: Inactive (04/03/2022)   Exercise Vital Sign    Days of Exercise per Week: 0 days    Minutes of Exercise per Session: 0 min  Stress: No Stress Concern Present (04/03/2022)   [place]    Feeling of Stress : Not at all  Social  Connections: Moderately Integrated (04/03/2022)   Social Connection and Isolation Panel [NHANES]    Frequency of Communication with Friends and Family: Three times a week    Frequency of Social Gatherings with Friends and Family: Three times a week    Attends Religious Services: Never    Active Member of Clubs or Organizations: Yes    Attends Music therapist: More than 4 times per year    Marital Status: Married     Family History:  The patient's family history includes Alcohol abuse in his father; Cancer in his mother; Diabetes in his mother; Emphysema in his mother; Rheum arthritis in his mother.  ROS:   Please see the history of present illness.  All other systems are reviewed and otherwise negative.    EKG(s)/Additional Labs   EKG:  EKG is not ordered today but reviewed prior tracing - NSR 93bpm, prior anterior MI, nonspecific STTW changes, QTc 456m, no acute change from prior  Recent Labs: 02/07/2022: TSH 3.45 07/20/2022: ALT 19; B Natriuretic Peptide 55.8 07/22/2022: BUN 24; Creatinine, Ser 1.04; Magnesium 2.1; Potassium 3.9; Sodium 141 07/29/2022: Hemoglobin 11.7; Platelets 195.0  Recent Lipid Panel    Component Value Date/Time   CHOL 96 02/07/2022 1028   CHOL 108 01/03/2020 0832   TRIG 73.0 02/07/2022 1028   HDL 36.60 (L) 02/07/2022 1028   HDL 40 01/03/2020 0832   CHOLHDL 3 02/07/2022 1028   VLDL 14.6 02/07/2022 1028   LDLCALC 45 02/07/2022 1028   LDLCALC 51 01/03/2020 0832    PHYSICAL EXAM:    VS:  BP (!) 126/58   Pulse 78   Ht '5\' 7"'$  (1.702 m)   Wt 159 lb 9.6 oz (72.4 kg)   SpO2 93%   BMI 25.00 kg/m   BMI: Body mass index is 25 kg/m.  GEN: Well nourished, well developed male in no acute distress HEENT: normocephalic, atraumatic Neck: no JVD, carotid bruits, or masses Cardiac: RRR; no murmurs, rubs, or gallops, no edema  Respiratory:  clear to auscultation bilaterally, normal work of breathing GI: soft, nontender, nondistended, + BS MS: no  deformity or atrophy Skin: warm and dry, no rash Neuro:  Alert and Oriented x 3, Strength and sensation are intact, follows commands Psych: euthymic mood, full affect  Wt Readings from Last 3 Encounters:  08/14/22 159 lb 9.6 oz (72.4 kg)  07/29/22 160 lb 12.8 oz (72.9 kg)  07/24/22 162 lb 6.4 oz (73.7 kg)     ASSESSMENT & PLAN:   1. Lower extremity edema - resolved. Suspect related to cellulitis as outlined in hospital notes. We went over his echo results and the fact that LV, RV were normal. BNP was also normal. EKG showed normal sinus rhythm with normal QTC.  2. CAD - no recent accelerating angina. Continue ASA, amlodipine, simvastatin. Troponins normal in the hospital.  3. ILD - continue follow-up with pulmonology.  4. Hyperlipidemia - max dose of simvastatin while on amlodipine is '20mg'$  daily which he is on. LDL was at goal in 01/2022. Given that he has done well on this medicine long term I am not inclined to change at this time. He had questions about an article he read in the paper (op) regarding potential detrimental effects of statin and depletion of Vit K2 and Co-q 10. We reviewed EBM surrounding statin use in coronary patients. We discussed that we don't supplement with vitamin K due to its potential to cause clotting. We also discussed that while CoQ10 has not been shown to alleviate adverse statin effects, many patients anecdotally take low dose 100-'200mg'$  without adverse effect.     Disposition: F/u with Dr. Burt Knack in [place] per our discussion.   Medication Adjustments/Labs and Tests Ordered: Current medicines are reviewed at length with the patient today.  Concerns regarding medicines are outlined above. Medication changes, Labs and Tests ordered today are summarized above and listed in the Patient Instructions accessible in Encounters.   Signed, Charlie Pitter, PA-C  08/14/2022 3:07 PM    [place] Phone: [phone]; Fax: [phone]

## 2022-08-14 ENCOUNTER — Ambulatory Visit: Payer: Medicare HMO | Attending: Physician Assistant | Admitting: Physician Assistant

## 2022-08-14 ENCOUNTER — Encounter: Payer: Self-pay | Admitting: Physician Assistant

## 2022-08-14 VITALS — BP 126/58 | HR 78 | Ht 67.0 in | Wt 159.6 lb

## 2022-08-14 DIAGNOSIS — I2510 Atherosclerotic heart disease of native coronary artery without angina pectoris: Secondary | ICD-10-CM

## 2022-08-14 DIAGNOSIS — E785 Hyperlipidemia, unspecified: Secondary | ICD-10-CM

## 2022-08-14 DIAGNOSIS — J849 Interstitial pulmonary disease, unspecified: Secondary | ICD-10-CM

## 2022-08-14 DIAGNOSIS — R600 Localized edema: Secondary | ICD-10-CM | POA: Diagnosis not present

## 2022-08-14 NOTE — Patient Instructions (Signed)
Medication Instructions:  Your physician recommends that you continue on your current medications as directed. Please refer to the Current Medication list given to you today.  *If you need a refill on your cardiac medications before your next appointment, please call your pharmacy*  Follow-Up: At [facility], you and your health needs are our priority.  As part of our continuing mission to provide you with exceptional heart care, we have created designated Provider Care Teams.  These Care Teams include your primary Cardiologist (physician) and Advanced Practice Providers (APPs -  Physician Assistants and Nurse Practitioners) who all work together to provide you with the care you need, when you need it.  Your next appointment:   5-6 month(s)  The format for your next appointment:   In Person  Provider:   [name], MD     Important Information About Sugar

## 2022-08-15 ENCOUNTER — Other Ambulatory Visit (HOSPITAL_COMMUNITY): Payer: Self-pay

## 2022-08-15 ENCOUNTER — Encounter: Payer: Self-pay | Admitting: Primary Care

## 2022-08-15 ENCOUNTER — Ambulatory Visit (INDEPENDENT_AMBULATORY_CARE_PROVIDER_SITE_OTHER): Payer: Medicare HMO | Admitting: Primary Care

## 2022-08-15 VITALS — BP 110/56 | HR 82 | Temp 98.0°F | Ht 67.0 in | Wt 160.0 lb

## 2022-08-15 DIAGNOSIS — G4730 Sleep apnea, unspecified: Secondary | ICD-10-CM

## 2022-08-15 DIAGNOSIS — J849 Interstitial pulmonary disease, unspecified: Secondary | ICD-10-CM

## 2022-08-15 DIAGNOSIS — G4733 Obstructive sleep apnea (adult) (pediatric): Secondary | ICD-10-CM

## 2022-08-15 DIAGNOSIS — J9611 Chronic respiratory failure with hypoxia: Secondary | ICD-10-CM | POA: Diagnosis not present

## 2022-08-15 NOTE — Telephone Encounter (Signed)
Per test claim for pirfenidone, copay for 30 day supply is $221.12 even after new year.  ATC patient to review. Unable to reach. Left VM requesting return call.  Knox Saliva, PharmD, MPH, BCPS, CPP Clinical Pharmacist (Rheumatology and Pulmonology)

## 2022-08-15 NOTE — Patient Instructions (Addendum)
Pulmonary function testing scheduled 09/16/21 at 11:30am   Orders: CPAP titration study (titrate oxygen and assess for BIPAP need)  Follow-up: February with [name]- 30 min ILD slot

## 2022-08-15 NOTE — Assessment & Plan Note (Addendum)
-   Following with [name] - Scheduled for pulmonary function testing in early February 2024 with follow-up after

## 2022-08-15 NOTE — Progress Notes (Signed)
$'@Patient'e$  ID: [name], male    DOB: 20-Oct-1940, 82 y.o.   MRN: [id number]  Chief Complaint  Patient presents with   Consult    Dx with sleep apnea.  Currently has CPAP thru [facility].  Interested in getting a BIPAP - uses oxygen qhs at 2 lpm continuous concentrator. Would like oxygen at night with PAP device.  Last sleep test 12/21/2015    Referring provider: [name], MD  HPI: 82 year old male, never smoked. PMH significant for HTN, CAD, ILD, bronchiectasis, OSA, GERD, thrombocytopenia, dyslipidemia.  Patient of [name], last seen on 07/29/22.  08/15/2022 Patient presents today for sleep consult  Wife states that he snores Newly on oxygen since December, wearing supplemental oxygen 24/7 He had sleep study in 2017 that showed severe OSA He was previously on auto CPAP 5-20cm h20. Machine is > 1 years old. He has not been wearing CPAP regularly last month or two since being put on oxygen. He does not have adapter for oxygen to use with CPAP. No download since 4G cut off. DME is [facility].  Following with Dr. Brantley Persons for ILD  He has removed bird and down pillows. Still has cough, not better with saline nasal spray  He has pulmonary function testing scheduled in early February   Allergies  Allergen Reactions   Celecoxib Other (See Comments)    Pt states precipitated his heart attack.   Tramadol Other (See Comments)    Makes patient jumpy    Immunization History  Administered Date(s) Administered   Fluad Quad(high Dose 65+) 05/01/2019, 05/28/2021, 05/01/2022   Influenza Split 06/01/2012   Influenza Whole 05/30/2009, 04/20/2010   Influenza, High Dose Seasonal PF 05/19/2017, 05/19/2018   Influenza-Unspecified 05/13/2007, 07/12/2013, 05/10/2016, 06/23/2020   PFIZER(Purple Top)SARS-COV-2 Vaccination 09/18/2019, 10/09/2019, 07/13/2020   PNEUMOCOCCAL CONJUGATE-20 01/14/2022   Pneumococcal Conjugate-13 05/19/2018   Pneumococcal Polysaccharide-23 08/22/2009   Tdap  12/04/2010, 06/04/2011, 07/31/2018   Zoster Recombinat (Shingrix) 01/14/2022   Zoster, Live 05/17/2008   Zoster, Unspecified 04/17/2022    Past Medical History:  Diagnosis Date   Abnormal chest x-ray    Allergic rhinitis    Ankylosing spondylitis (HCC)    Anxiety    Arthritis    Colonic polyp    Coronary atherosclerosis of native coronary artery    Cough    Dizziness 10/21/2016   Ganglion cyst of wrist    left   Heart attack ([place]) 2008   Hemorrhoids    History of malaria    Hyperlipidemia    with low HDL   Hypertension    Kidney stone    Other disorder of muscle, ligament, and fascia    Rotator cuff tear    Swelling of lower extremity 10/16/2015    Tobacco History: Social History   Tobacco Use  Smoking Status Never   Passive exposure: Never  Smokeless Tobacco Never   Counseling given: Not Answered   Outpatient Medications Prior to Visit  Medication Sig Dispense Refill   acetaminophen (TYLENOL) 325 MG tablet Take 650 mg by mouth every 6 (six) hours as needed for mild pain or moderate pain.     amLODipine (NORVASC) 2.5 MG tablet Take 1 tablet (2.5 mg total) by mouth daily. 90 tablet 1   Apoaequorin (PREVAGEN PO) Take 1 tablet by mouth daily.      aspirin 81 MG EC tablet Take 81 mg by mouth daily.     budesonide-formoterol (SYMBICORT) 80-4.5 MCG/ACT inhaler Inhale 2 puffs into the lungs 2 (  two) times daily. 1 each 3   Cholecalciferol (VITAMIN D3) 1.25 MG (50000 UT) CAPS Take 1 capsule by mouth daily. Pt does not know dose.     famotidine (PEPCID) 20 MG tablet Take 20 mg by mouth daily.     hydroxyurea (HYDREA) 500 MG capsule Take 1 capsule (500 mg total) by mouth daily. May take with food to minimize GI side effects. 30 capsule 3   Multiple Vitamins-Minerals (ICAPS AREDS 2) CAPS Take by mouth daily.     pantoprazole (PROTONIX) 40 MG tablet Take 1 tablet (40 mg total) by mouth 2 (two) times daily. 60 tablet 0   simvastatin (ZOCOR) 20 MG tablet TAKE 1 TABLET EVERY DAY  WITH BREAKFAST (APPOINTMENT IS NEEDED FOR REFILLS) 90 tablet 3   UNABLE TO FIND Med Name: CPAP per Dr Halford Chessman     No facility-administered medications prior to visit.   Review of Systems  Review of Systems  Constitutional: Negative.   Respiratory:  Positive for cough.   Cardiovascular: Negative.   Psychiatric/Behavioral:  Positive for sleep disturbance.      Physical Exam  BP (!) 110/56 (BP Location: Left Arm, Patient Position: Sitting, Cuff Size: Normal)   Pulse 82   Temp 98 F (36.7 C) (Oral)   Ht '5\' 7"'$  (1.702 m)   Wt 160 lb (72.6 kg)   SpO2 95% Comment: 2 lpm continuous (tank)  BMI 25.06 kg/m  Physical Exam Constitutional:      Appearance: Normal appearance.  HENT:     Head: Normocephalic and atraumatic.     Mouth/Throat:     Mouth: Mucous membranes are moist.     Pharynx: Oropharynx is clear.  Cardiovascular:     Rate and Rhythm: Normal rate and regular rhythm.  Pulmonary:     Effort: Pulmonary effort is normal.     Breath sounds: Rales present.     Comments: 2L oxygen Musculoskeletal:        General: Normal range of motion.  Skin:    General: Skin is warm and dry.  Neurological:     General: No focal deficit present.     Mental Status: He is alert and oriented to person, place, and time. Mental status is at baseline.  Psychiatric:        Mood and Affect: Mood normal.        Behavior: Behavior normal.        Thought Content: Thought content normal.        Judgment: Judgment normal.      Lab Results:  CBC    Component Value Date/Time   WBC 25.4 Repeated and verified X2. (HH) 07/29/2022 1017   RBC 4.02 (L) 07/29/2022 1017   HGB 11.7 (L) 07/29/2022 1017   HGB 11.9 (L) 07/17/2022 0759   HGB 15.5 04/21/2017 0823   HCT 37.1 (L) 07/29/2022 1017   HCT 45.4 04/21/2017 0823   PLT 195.0 07/29/2022 1017   PLT 201 07/17/2022 0759   PLT 79 Few Large & Occ giant platelets (L) 04/21/2017 0823   MCV 92.1 07/29/2022 1017   MCV 88.7 05/19/2017 1204   MCV 91.2  04/21/2017 0823   MCH 28.9 07/23/2022 0410   MCHC 31.7 07/29/2022 1017   RDW 27.2 (H) 07/29/2022 1017   RDW 14.6 04/21/2017 0823   LYMPHSABS 1.5 07/20/2022 1903   LYMPHSABS 1.4 04/21/2017 0823   MONOABS 1.7 (H) 07/20/2022 1903   MONOABS 0.6 04/21/2017 0823   EOSABS 0.0 07/20/2022 1903   EOSABS 0.0 04/21/2017  0823   BASOSABS 0.2 (H) 07/20/2022 1903   BASOSABS 0.0 04/21/2017 0823    BMET    Component Value Date/Time   NA 141 07/22/2022 0433   NA 141 01/03/2020 0832   NA 141 04/21/2017 0823   K 3.9 07/22/2022 0433   K 4.0 04/21/2017 0823   CL 102 07/22/2022 0433   CL 106 09/30/2012 0949   CO2 26 07/22/2022 0433   CO2 25 04/21/2017 0823   GLUCOSE 171 (H) 07/22/2022 0433   GLUCOSE 126 04/21/2017 0823   GLUCOSE 198 (H) 09/30/2012 0949   BUN 24 (H) 07/22/2022 0433   BUN 20 01/03/2020 0832   BUN 20.2 04/21/2017 0823   CREATININE 1.04 07/22/2022 0433   CREATININE 0.78 07/17/2022 0759   CREATININE 0.9 04/21/2017 0823   CALCIUM 9.3 07/22/2022 0433   CALCIUM 9.6 04/21/2017 0823   GFRNONAA >60 07/22/2022 0433   GFRNONAA >60 07/17/2022 0759   GFRNONAA 84 11/28/2015 1008   GFRAA 96 01/03/2020 0832   GFRAA >89 11/28/2015 1008    BNP    Component Value Date/Time   BNP 55.8 07/20/2022 1903   BNP 17.2 11/28/2015 1008    ProBNP    Component Value Date/Time   PROBNP 53.0 09/12/2006 1303    Imaging: VAS [place] LOWER EXTREMITY VENOUS (DVT) (7a-7p)  Result Date: 07/22/2022  Lower Venous DVT Study Patient Name:  YUL DIANA  Date of Exam:   07/21/2022 Medical Rec #: [id number]        Accession #:    [id number] Date of Birth: 24-Apr-1941       Patient Gender: M Patient Age:   89 years Exam Location:  [facility] Procedure:      VAS [place] LOWER EXTREMITY VENOUS (DVT) Referring Phys: COOPER ROBBINS --------------------------------------------------------------------------------  Indications: Pain, Swelling, Erythema, and SOB.  Comparison Study: Prior negative right LEV done  10/16/15 Performing Technologist: Sharion Dove RVS  Examination Guidelines: A complete evaluation includes B-mode imaging, spectral Doppler, color Doppler, and power Doppler as needed of all accessible portions of each vessel. Bilateral testing is considered an integral part of a complete examination. Limited examinations for reoccurring indications may be performed as noted. The reflux portion of the exam is performed with the patient in reverse Trendelenburg.  +---------+---------------+---------+-----------+----------+--------------+ RIGHT    CompressibilityPhasicitySpontaneityPropertiesThrombus Aging +---------+---------------+---------+-----------+----------+--------------+ CFV      Full           Yes      Yes                                 +---------+---------------+---------+-----------+----------+--------------+ SFJ      Full                                                        +---------+---------------+---------+-----------+----------+--------------+ FV Prox  Full                                                        +---------+---------------+---------+-----------+----------+--------------+ FV Mid   Full                                                        +---------+---------------+---------+-----------+----------+--------------+  FV DistalFull                                                        +---------+---------------+---------+-----------+----------+--------------+ PFV      Full                                                        +---------+---------------+---------+-----------+----------+--------------+ POP      Full           Yes      Yes                                 +---------+---------------+---------+-----------+----------+--------------+ PTV      Full                                                        +---------+---------------+---------+-----------+----------+--------------+ PERO     Full                                                         +---------+---------------+---------+-----------+----------+--------------+   +---------+---------------+---------+-----------+----------+--------------+ LEFT     CompressibilityPhasicitySpontaneityPropertiesThrombus Aging +---------+---------------+---------+-----------+----------+--------------+ CFV      Full           Yes      Yes                                 +---------+---------------+---------+-----------+----------+--------------+ SFJ      Full                                                        +---------+---------------+---------+-----------+----------+--------------+ FV Prox  Full                                                        +---------+---------------+---------+-----------+----------+--------------+ FV Mid   Full                                                        +---------+---------------+---------+-----------+----------+--------------+ FV DistalFull                                                        +---------+---------------+---------+-----------+----------+--------------+  PFV      Full                                                        +---------+---------------+---------+-----------+----------+--------------+ POP      Full           Yes      Yes                                 +---------+---------------+---------+-----------+----------+--------------+ PTV      Full                                                        +---------+---------------+---------+-----------+----------+--------------+ PERO     Full                                                        +---------+---------------+---------+-----------+----------+--------------+     Summary: BILATERAL: - No evidence of deep vein thrombosis seen in the lower extremities, bilaterally. -No evidence of popliteal cyst, bilaterally. RIGHT: - Ultrasound characteristics of enlarged lymph nodes are noted in the groin.   LEFT: - Ultrasound characteristics of enlarged lymph nodes noted in the groin.  *See table(s) above for measurements and observations. Electronically signed by Monica Martinez MD on 07/22/2022 at 1:47:04 PM.    Final    CT Chest High Resolution  Result Date: 07/21/2022 CLINICAL DATA:  Diffuse interstitial lung disease. EXAM: CT CHEST WITHOUT CONTRAST TECHNIQUE: Multidetector CT imaging of the chest was performed following the standard protocol without intravenous contrast. High resolution imaging of the lungs, as well as inspiratory and expiratory imaging, was performed. RADIATION DOSE REDUCTION: This exam was performed according to the departmental dose-optimization program which includes automated exposure control, adjustment of the mA and/or kV according to patient size and/or use of iterative reconstruction technique. COMPARISON:  07/20/2022, 05/04/2022 FINDINGS: Cardiovascular: Aortic atherosclerosis. Normal heart size. Three-vessel coronary artery calcifications and/or stents. No pericardial effusion. Mediastinum/Nodes: Unchanged prominent mediastinal lymph nodes. Small hiatal hernia. Thyroid gland, trachea, and esophagus demonstrate no significant findings. Lungs/Pleura: Low volume examination with severe elevation of the right hemidiaphragm as well as associated bandlike scarring or atelectasis. Extensive, acutely superimposed ground-glass and heterogeneous airspace disease, particularly in the left lung base (series 4, image 108). Underlying pulmonary fibrosis characterized by recent LD protocol examination dated 05/04/2022 is not appreciably changed, although assessment is substantially limited in the setting of acute airspace disease. No significant air trapping on expiratory phase imaging. Upper Abdomen: No acute abnormality. Colon anterior to the liver dome in the included upper abdomen (series 3, image 83). Musculoskeletal: No chest wall abnormality. No acute osseous findings. IMPRESSION: 1. Low  volume examination with severe elevation of the right hemidiaphragm as well as associated bandlike scarring or atelectasis of the right lung base. 2. Extensive, acutely superimposed ground-glass and heterogeneous airspace disease, particularly in the left lung base. Findings are consistent with multifocal infection or aspiration. 3. Underlying pulmonary fibrosis  characterized by recent ILD protocol examination dated 05/04/2022 is not appreciably changed, although assessment is substantially limited in the setting of acute airspace disease. Fibrosis was previously characterized as UIP pattern. 4. Coronary artery disease. Aortic Atherosclerosis (ICD10-I70.0). Electronically Signed   By: Delanna Ahmadi M.D.   On: 07/21/2022 14:36   ECHOCARDIOGRAM COMPLETE  Result Date: 07/21/2022    ECHOCARDIOGRAM REPORT   Patient Name:   EULIS SALAZAR Date of Exam: 07/21/2022 Medical Rec #:  [id number]       Height:       67.0 in Accession #:    [id number]      Weight:       162.7 lb Date of Birth:  09-Mar-1941      BSA:          1.852 m Patient Age:    27 years        BP:           114/51 mmHg Patient Gender: M               HR:           67 bpm. Exam Location:  Inpatient Procedure: 2D Echo, Cardiac Doppler and Color Doppler Indications:    Pulmonary hypertension I27.2  History:        Patient has prior history of Echocardiogram examinations, most                 recent 05/01/2022. CAD and Angina, COPD; Risk                 Factors:Hypertension, Sleep Apnea and Dyslipidemia.  Sonographer:    Ronny Flurry Referring Phys: [facility] IMPRESSIONS  1. Left ventricular ejection fraction, by estimation, is 60 to 65%. The left ventricle has normal function. The left ventricle has no regional wall motion abnormalities. There is mild left ventricular hypertrophy of the basal-septal segment. Left ventricular diastolic parameters are indeterminate.  2. Right ventricular systolic function is normal. The right ventricular size is  normal. Tricuspid regurgitation signal is inadequate for assessing PA pressure.  3. The mitral valve is normal in structure. Trivial mitral valve regurgitation. No evidence of mitral stenosis.  4. The aortic valve is tricuspid. Aortic valve regurgitation is not visualized. Aortic valve sclerosis/calcification is present, without any evidence of aortic stenosis. Aortic valve area, by VTI measures 2.47 cm. Aortic valve mean gradient measures 6.0 mmHg. Aortic valve Vmax measures 1.63 m/s.  5. The inferior vena cava is normal in size with greater than 50% respiratory variability, suggesting right atrial pressure of 3 mmHg. FINDINGS  Left Ventricle: Left ventricular ejection fraction, by estimation, is 60 to 65%. The left ventricle has normal function. The left ventricle has no regional wall motion abnormalities. The left ventricular internal cavity size was normal in size. There is  mild left ventricular hypertrophy of the basal-septal segment. Left ventricular diastolic parameters are indeterminate. Normal left ventricular filling pressure. Right Ventricle: The right ventricular size is normal. No increase in right ventricular wall thickness. Right ventricular systolic function is normal. Tricuspid regurgitation signal is inadequate for assessing PA pressure. Left Atrium: Left atrial size was normal in size. Right Atrium: Right atrial size was normal in size. Pericardium: There is no evidence of pericardial effusion. Mitral Valve: The mitral valve is normal in structure. Trivial mitral valve regurgitation. No evidence of mitral valve stenosis. Tricuspid Valve: The tricuspid valve is normal in structure. Tricuspid valve regurgitation is not demonstrated. No evidence of tricuspid stenosis. Aortic  Valve: The aortic valve is tricuspid. Aortic valve regurgitation is not visualized. Aortic valve sclerosis/calcification is present, without any evidence of aortic stenosis. Aortic valve mean gradient measures 6.0 mmHg. Aortic  valve peak gradient measures 10.6 mmHg. Aortic valve area, by VTI measures 2.47 cm. Pulmonic Valve: The pulmonic valve was normal in structure. Pulmonic valve regurgitation is mild. No evidence of pulmonic stenosis. Aorta: The aortic root is normal in size and structure. Venous: The inferior vena cava is normal in size with greater than 50% respiratory variability, suggesting right atrial pressure of 3 mmHg. IAS/Shunts: No atrial level shunt detected by color flow Doppler.  LEFT VENTRICLE PLAX 2D LVIDd:         5.00 cm   Diastology LVIDs:         3.20 cm   LV e' medial:    5.66 cm/s LV PW:         1.10 cm   LV E/e' medial:  15.7 LV IVS:        1.20 cm   LV e' lateral:   7.62 cm/s LVOT diam:     2.10 cm   LV E/e' lateral: 11.7 LV SV:         82 LV SV Index:   45 LVOT Area:     3.46 cm  RIGHT VENTRICLE             IVC RV Basal diam:  3.00 cm     IVC diam: 1.20 cm RV S prime:     16.10 cm/s RVOT diam:      3.00 cm TAPSE (M-mode): 1.6 cm LEFT ATRIUM             Index        RIGHT ATRIUM          Index LA diam:        4.30 cm 2.32 cm/m   RA Area:     9.85 cm LA Vol (A2C):   51.4 ml 27.75 ml/m  RA Volume:   20.40 ml 11.01 ml/m LA Vol (A4C):   43.6 ml 23.54 ml/m LA Biplane Vol: 51.7 ml 27.91 ml/m  AORTIC VALVE AV Area (Vmax):    2.74 cm AV Area (Vmean):   2.54 cm AV Area (VTI):     2.47 cm AV Vmax:           163.00 cm/s AV Vmean:          108.000 cm/s AV VTI:            0.334 m AV Peak Grad:      10.6 mmHg AV Mean Grad:      6.0 mmHg LVOT Vmax:         129.00 cm/s LVOT Vmean:        79.100 cm/s LVOT VTI:          0.238 m LVOT/AV VTI ratio: 0.71  AORTA Ao Root diam: 3.20 cm Ao Asc diam:  3.10 cm MITRAL VALVE MV Area (PHT): 3.85 cm    SHUNTS MV Decel Time: 197 msec    Systemic VTI:  0.24 m MV E velocity: 89.10 cm/s  Systemic Diam: 2.10 cm MV A velocity: 92.80 cm/s  Pulmonic Diam: 3.00 cm MV E/A ratio:  0.96 Fransico Him MD Electronically signed by Fransico Him MD Signature Date/Time: 07/21/2022/10:38:18 AM     Final    CT Angio Chest PE W and/or Wo Contrast  Result Date: 07/20/2022 CLINICAL DATA:  Patient having dry cough and  shortness of breath for 3-4 days. Patient at swelling in the legs and feet in past couple of days. EXAM: CT ANGIOGRAPHY CHEST WITH CONTRAST TECHNIQUE: Multidetector CT imaging of the chest was performed using the standard protocol during bolus administration of intravenous contrast. Multiplanar CT image reconstructions and MIPs were obtained to evaluate the vascular anatomy. RADIATION DOSE REDUCTION: This exam was performed according to the departmental dose-optimization program which includes automated exposure control, adjustment of the mA and/or kV according to patient size and/or use of iterative reconstruction technique. CONTRAST:  49m OMNIPAQUE IOHEXOL 350 MG/ML SOLN COMPARISON:  CT examination dated May 04, 2022 FINDINGS: Cardiovascular: Satisfactory opacification of the pulmonary arteries to the segmental level. No evidence of pulmonary embolism. Normal heart size. No pericardial effusion. Prominent coronary artery atherosclerotic calcifications. Main pulmonary trunk is dilated measuring up to 3.2 cm concerning for pulmonary arterial hypertension. Mediastinum/Nodes: No enlarged mediastinal, hilar, or axillary lymph nodes. Thyroid gland, trachea, and esophagus demonstrate no significant findings. Lungs/Pleura: Prominence of the central pulmonary vessels as well as mild bilateral perihilar and lower lobe ground-glass opacities. Findings may be secondary to infectious/inflammatory process or mild pulmonary edema in the background of chronic interstitial lung disease with mild bronchiectasis and scarring. No large pleural effusion. Chronic elevation of the right hemidiaphragm, unchanged. Upper Abdomen: No acute abnormality. 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 Review of the MIP images confirms the above  findings. IMPRESSION: 1. No evidence of pulmonary embolism. 2. Prominence of the central pulmonary vessels as well as mild bilateral perihilar and lower lobe ground-glass opacities. Findings may be secondary to mild pulmonary edema or infectious/inflammatory process in the background of chronic interstitial lung disease. No large pleural effusion. 3. Prominent coronary artery atherosclerotic calcifications. 4. Main pulmonary trunk is dilated measuring up to 3.2 cm concerning for pulmonary arterial hypertension. Electronically Signed   By: IKeane PoliceD.O.   On: 07/20/2022 22:09   [name] 2 View  Result Date: 07/20/2022 CLINICAL DATA:  Cough, COPD EXAM: CHEST - 2 VIEW COMPARISON:  03/11/2022 FINDINGS: Frontal and lateral views of the chest demonstrate a stable cardiac silhouette. Chronic elevation of the right hemidiaphragm. Stable bibasilar fibrosis and bronchiectasis. No acute airspace disease, effusion, or pneumothorax. IMPRESSION: 1. Stable bibasilar fibrosis and bronchiectasis. 2. No acute airspace disease. 3. Chronic elevation right hemidiaphragm. Electronically Signed   By: MRanda NgoM.D.   On: 07/20/2022 17:35     Assessment & Plan:   Obstructive sleep apnea - History of severe obstructive sleep apnea, HST in May 2017 >> AHI 44.5/hour - No compliance report available d/t 4G cut off  - Newly on oxygen for chronic respiratory failure d/t ILD  - Needs CPAP titration study in-lab to determine correct oxygen liter flow and potential BIPAP needed  - Due for new PAP machine. DME is [facility].   [place] Following with Dr. RChase Caller- Scheduled for pulmonary function testing in early February 2024 with follow-up after    EMartyn Ehrich NP 08/15/2022

## 2022-08-15 NOTE — Assessment & Plan Note (Addendum)
-   History of severe obstructive sleep apnea, HST in May 2017 >> AHI 44.5/hour - No compliance report available d/t 4G cut off  - Newly on oxygen for chronic respiratory failure d/t ILD  - Needs CPAP titration study in-lab to determine correct oxygen liter flow and potential BIPAP needed  - Due for new PAP machine. DME is [facility].

## 2022-08-22 ENCOUNTER — Telehealth: Payer: Self-pay | Admitting: Internal Medicine

## 2022-08-22 NOTE — Telephone Encounter (Signed)
Spoke with pt who states dry cough and wheezing has increased since night before last. Pt denies SOB. Pt states cough is worse at night. Pt denies fever/ chills/ GI upset. Pt states he is not currently on any prednisone nor ABTs. Pt states using Symbicort as directed. [name] please advise.

## 2022-08-22 NOTE — Telephone Encounter (Signed)
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

## 2022-08-22 NOTE — Telephone Encounter (Signed)
PT calling stating he has SOB and Cough in eve hours.   Already has a PFT test sched  for Feb & Recall order in to sched  appt w/Dr. R when his sched is released.  Until then can we help? He was told to call in if he did have issues.   Pls call PT @ [phone]

## 2022-08-22 NOTE — Telephone Encounter (Signed)
He talked about his chronic cough at the last visit.  He did have a previous allergy test positive for dust mite.  Please ensure he is taking his Symbicort daily We can do a 5-day prednisone if he is interested.  If so - Please take prednisone 40 mg x1 day, then 30 mg x1 day, then 20 mg x1 day, then 10 mg x1 day, and then 5 mg x1 day and stop

## 2022-08-23 ENCOUNTER — Encounter: Payer: Self-pay | Admitting: Family Medicine

## 2022-08-23 ENCOUNTER — Ambulatory Visit (INDEPENDENT_AMBULATORY_CARE_PROVIDER_SITE_OTHER): Payer: Medicare HMO | Admitting: Family Medicine

## 2022-08-23 VITALS — BP 110/74 | HR 84 | Temp 98.6°F | Ht 67.0 in | Wt 161.6 lb

## 2022-08-23 DIAGNOSIS — J849 Interstitial pulmonary disease, unspecified: Secondary | ICD-10-CM

## 2022-08-23 DIAGNOSIS — I25119 Atherosclerotic heart disease of native coronary artery with unspecified angina pectoris: Secondary | ICD-10-CM

## 2022-08-23 DIAGNOSIS — E7800 Pure hypercholesterolemia, unspecified: Secondary | ICD-10-CM | POA: Diagnosis not present

## 2022-08-23 DIAGNOSIS — D729 Disorder of white blood cells, unspecified: Secondary | ICD-10-CM

## 2022-08-23 DIAGNOSIS — I10 Essential (primary) hypertension: Secondary | ICD-10-CM

## 2022-08-23 DIAGNOSIS — D471 Chronic myeloproliferative disease: Secondary | ICD-10-CM

## 2022-08-23 MED ORDER — AMLODIPINE BESYLATE 2.5 MG PO TABS: 2.5000 mg | ORAL_TABLET | Freq: Every day | ORAL | 1 refills | Status: AC

## 2022-08-23 MED ORDER — PREDNISONE 10 MG PO TABS: ORAL_TABLET | ORAL | 0 refills | Status: AC

## 2022-08-23 NOTE — Progress Notes (Signed)
Subjective:  Patient ID: [name], male    DOB: 1940/10/01  Age: 82 y.o. MRN: [id number]  CC:  Chief Complaint  Patient presents with   Hypertension    No concerns, notes light headed moving, sitting, after coughing,    Cough    Pt notes when he lays down he starts coughing and can't sleep has spoken to pulmonology and they are dosing him with prednisone      HPI [name] presents for   Hypertension: With lower extremity edema, CAD, recent visit with cardiology on January 3.  Lower extremity edema had resolved, thought to have been due to prior cellulitis.  Echo with reported LV, RV normal and normal BNP.  Continued on amlodipine, simvastatin for hyperlipidemia and aspirin with history of CAD.   Amlodipine 2.5 mg daily. No new neg swelling or side effects.  Home readings: 120-140/70-80, wrist monitor.  BP Readings from Last 3 Encounters:  08/23/22 110/74  08/15/22 (!) 110/56  08/14/22 (!) 126/58   Lab Results  Component Value Date   CREATININE 1.04 07/22/2022   Hyperlipidemia: On max dose simvastatin due to current use of amlodipine.  No changes given stable LDL at cardiology visit. No new side effects. Not fasting today.  Lab Results  Component Value Date   CHOL 96 02/07/2022   HDL 36.60 (L) 02/07/2022   LDLCALC 45 02/07/2022   TRIG 73.0 02/07/2022   CHOLHDL 3 02/07/2022   Lab Results  Component Value Date   ALT 19 07/20/2022   AST 25 07/20/2022   ALKPHOS 87 07/20/2022   BILITOT 0.8 07/20/2022   Cough History of interstitial lung disease, bronchiectasis treated by pulmonary, [name].  As above some increased cough with lying down, recently started on prednisone. Telephone note from pulmonary noted from yesterday, had been on Symbicort, started on prednisone taper - plans to start today.  Using 2 liters O2. Feels like cough has been increasing as prior to hospital, but ont that bad.  No fever. Some wheezing.   Leukocytosis with history of  myeloproliferative disorder Discussed at his December 13 visit.  Thought to be related to use of prednisone.  Improved from 36.2-25.4 on December 18 from December 12. Appt with Dr [name] in 4 days.    Prediabetes: Hyperglycemia in hospital with steroid, SSI used then. Normal A1c in December.  Lab Results  Component Value Date   HGBA1C 5.4 07/22/2022   Wt Readings from Last 3 Encounters:  08/23/22 161 lb 9.6 oz (73.3 kg)  08/15/22 160 lb (72.6 kg)  08/14/22 159 lb 9.6 oz (72.4 kg)      History Patient Active Problem List   Diagnosis Date Noted   Acute respiratory failure with hypoxia ([place]) 07/20/2022   Myeloproliferative disorder ([place]) 07/20/2022   Leukocytosis 06/03/2022   Hypertrophy of prostate without urinary obstruction and other lower urinary tract symptoms (LUTS) 12/04/2021   Prostate cancer ([place]) 05/29/2021   GERD (gastroesophageal reflux disease) 12/15/2017   Unstable angina ([place]) 12/05/2017   Angina at rest 12/05/2017   Atypical chest pain    Dyslipidemia    COPD not affecting current episode of care    Essential hypertension 05/29/2017   Abnormal nuclear cardiac imaging test 01/14/2017   Bronchiectasis without complication ([place]) 65/99/3570   ILD (interstitial lung disease) ([place]) 11/27/2016   Dizziness 10/21/2016   Cough variant asthma  vs UACS 07/22/2016   Obstructive sleep apnea 12/01/2015   Acute bronchitis 11/30/2015   Swelling of lower extremity  10/16/2015   Benign mole 02/04/2014   Impaired glucose tolerance 10/26/2013   NAFLD (nonalcoholic fatty liver disease) 04/27/2013   Thrombocytopenia ([place]) 10/21/2012   Exertional angina 09/03/2012   MALARIA 12/05/2009   COLONIC POLYPS 12/05/2009   HYPERCHOLESTEROLEMIA 12/05/2009   ANXIETY 12/05/2009   HEMORRHOIDS 12/05/2009   Allergic rhinitis 12/05/2009   GANGLION CYST, WRIST, LEFT 12/05/2009   ROTATOR CUFF TEAR 12/05/2009   OTHER DISORDER OF MUSCLE LIGAMENT AND FASCIA 12/05/2009   ABNORMAL CHEST XRAY  12/05/2009   CAD (coronary artery disease) 04/05/2009   Past Medical History:  Diagnosis Date   Abnormal chest x-ray    Allergic rhinitis    Ankylosing spondylitis (HCC)    Anxiety    Arthritis    Colonic polyp    Coronary atherosclerosis of native coronary artery    Cough    Dizziness 10/21/2016   Ganglion cyst of wrist    left   Heart attack ([place]) 2008   Hemorrhoids    History of malaria    Hyperlipidemia    with low HDL   Hypertension    Kidney stone    Other disorder of muscle, ligament, and fascia    Rotator cuff tear    Swelling of lower extremity 10/16/2015   Past Surgical History:  Procedure Laterality Date   bicep rupture     CORONARY ANGIOPLASTY WITH STENT PLACEMENT     HEMORRHOID SURGERY  05/2009   in [place]   LEFT HEART CATH AND CORONARY ANGIOGRAPHY N/A 01/14/2017   Procedure: Left Heart Cath and Coronary Angiography;  Surgeon: [name], MD;  Location: [facility];  Service: Cardiovascular;  Laterality: N/A;   LEFT HEART CATHETERIZATION WITH CORONARY ANGIOGRAM N/A 09/03/2012   Procedure: LEFT HEART CATHETERIZATION WITH CORONARY ANGIOGRAM;  Surgeon: [name], MD;  Location: [facility] CATH LAB;  Service: Cardiovascular;  Laterality: N/A;   PROSTATE BIOPSY     ROTATOR CUFF REPAIR     bilat by Dr. French Ana   Allergies  Allergen Reactions   Celecoxib Other (See Comments)    Pt states precipitated his heart attack.   Tramadol Other (See Comments)    Makes patient jumpy   Prior to Admission medications   Medication Sig Start Date End Date Taking? Authorizing Provider  acetaminophen (TYLENOL) 325 MG tablet Take 650 mg by mouth every 6 (six) hours as needed for mild pain or moderate pain.   Yes [provider]  amLODipine (NORVASC) 2.5 MG tablet Take 1 tablet (2.5 mg total) by mouth daily. 04/22/22  Yes [name], MD  Apoaequorin (PREVAGEN PO) Take 1 tablet by mouth daily.    Yes [provider]  aspirin 81 MG EC tablet Take 81  mg by mouth daily.   Yes [provider]  budesonide-formoterol (SYMBICORT) 80-4.5 MCG/ACT inhaler Inhale 2 puffs into the lungs 2 (two) times daily. 03/11/22  Yes [name], MD  Cholecalciferol (VITAMIN D3) 1.25 MG (50000 UT) CAPS Take 1 capsule by mouth daily. Pt does not know dose.   Yes [provider]  famotidine (PEPCID) 20 MG tablet Take 20 mg by mouth daily.   Yes [provider]  hydroxyurea (HYDREA) 500 MG capsule Take 1 capsule (500 mg total) by mouth daily. May take with food to minimize GI side effects. 06/03/22  Yes Curt Bears, MD  Multiple Vitamins-Minerals (ICAPS AREDS 2) CAPS Take by mouth daily.   Yes [provider]  predniSONE (DELTASONE) 10 MG tablet Take 4 tablets (40  mg total) by mouth daily with breakfast for 1 day, THEN 3 tablets (30 mg total) daily with breakfast for 1 day, THEN 2 tablets (20 mg total) daily with breakfast for 1 day, THEN 1 tablet (10 mg total) daily with breakfast for 1 day, THEN 0.5 tablets (5 mg total) daily with breakfast for 1 day. 08/23/22 08/28/22 Yes Brand Males, MD  simvastatin (ZOCOR) 20 MG tablet TAKE 1 TABLET EVERY DAY WITH BREAKFAST (APPOINTMENT IS NEEDED FOR REFILLS) 12/19/21  Yes [name], MD  UNABLE TO FIND Med Name: CPAP per Dr Halford Chessman   Yes [provider]  pantoprazole (PROTONIX) 40 MG tablet Take 1 tablet (40 mg total) by mouth 2 (two) times daily. 07/23/22 08/22/22  Shelly Coss, MD   Social History   Socioeconomic History   Marital status: Married    Spouse name: [name]   Number of children: 2   Years of education: Not on file   Highest education level: Not on file  Occupational History   Not on file  Tobacco Use   Smoking status: Never    Passive exposure: Never   Smokeless tobacco: Never  Vaping Use   Vaping Use: Never used  Substance and Sexual Activity   Alcohol use: Yes    Comment: holiday Rare    Drug use: No   Sexual activity: Not Currently  Other  Topics Concern   Not on file  Social History Narrative   Not on file   Social Determinants of Health   Financial Resource Strain: Low Risk  (04/03/2022)   Overall Financial Resource Strain (CARDIA)    Difficulty of Paying Living Expenses: Not hard at all  Food Insecurity: No Food Insecurity (07/25/2022)   Hunger Vital Sign    Worried About Running Out of Food in the Last Year: Never true    Ran Out of Food in the Last Year: Never true  Transportation Needs: No Transportation Needs (07/25/2022)   PRAPARE - Hydrologist (Medical): No    Lack of Transportation (Non-Medical): No  Physical Activity: Inactive (04/03/2022)   Exercise Vital Sign    Days of Exercise per Week: 0 days    Minutes of Exercise per Session: 0 min  Stress: No Stress Concern Present (04/03/2022)   [place]    Feeling of Stress : Not at all  Social Connections: Moderately Integrated (04/03/2022)   Social Connection and Isolation Panel [NHANES]    Frequency of Communication with Friends and Family: Three times a week    Frequency of Social Gatherings with Friends and Family: Three times a week    Attends Religious Services: Never    Active Member of Clubs or Organizations: Yes    Attends Archivist Meetings: More than 4 times per year    Marital Status: Married  Human resources officer Violence: Not At Risk (07/21/2022)   Humiliation, Afraid, Rape, and Kick questionnaire    Fear of Current or Ex-Partner: No    Emotionally Abused: No    Physically Abused: No    Sexually Abused: No    Review of Systems Per HPI.   Objective:   Vitals:   08/23/22 0852  BP: 110/74  Pulse: 84  Temp: 98.6 F (37 C)  TempSrc: Temporal  SpO2: 93%  Weight: 161 lb 9.6 oz (73.3 kg)  Height: '5\' 7"'$  (1.702 m)     Physical Exam Vitals reviewed.  Constitutional:  Appearance: He is well-developed.  HENT:     Head:  Normocephalic and atraumatic.  Neck:     Vascular: No carotid bruit or JVD.  Cardiovascular:     Rate and Rhythm: Normal rate and regular rhythm.     Heart sounds: Normal heart sounds. No murmur heard. Pulmonary:     Effort: Pulmonary effort is normal.     Breath sounds: Rales (basilar few coarse breath sounds. wearing O2, no distress.) present. No wheezing.  Musculoskeletal:     Right lower leg: No edema.     Left lower leg: No edema.  Skin:    General: Skin is warm and dry.  Neurological:     Mental Status: He is alert and oriented to person, place, and time.  Psychiatric:        Mood and Affect: Mood normal.     Assessment & Plan:  [name] is a 82 y.o. male . ILD (interstitial lung disease) ([place])  -With recent increased cough, reported wheeze but I do not hear wheeze on exam.  Plan for prednisone taper per pulmonary note yesterday, has not yet started.  Hopefully will have some improvement with this treatment, but ER/urgent care precautions given over the weekend.  Continue home oxygen, and follow-up with pulmonary as planned.  Coronary artery disease involving native coronary artery of native heart with angina pectoris ([place]) - Plan: amLODipine (NORVASC) 2.5 MG tablet  -Asymptomatic, tolerating current med regimen including statin, and potential benefits versus risks were discussed of statins with understanding expressed.  Abnormal white blood cell (WBC) count Myeloproliferative disorder (HCC)  -Improving CBC on last check, does have follow-up with hematology in the next 4 days.  Hold on new labs for now.  Essential hypertension  -Stable with current regimen, continue amlodipine  HYPERCHOLESTEROLEMIA - Plan: Lipid panel, Comprehensive metabolic panel  -Tolerating statin, no higher dose given use of amlodipine.  Check labs with fasting lab visit, can be performed at his hematology visit or at [facility] as a lab only visit.  Handicap placard completed for his  history of ILD.  101-monthfollow-up with RTC precautions.  Meds ordered this encounter  Medications   amLODipine (NORVASC) 2.5 MG tablet    Sig: Take 1 tablet (2.5 mg total) by mouth daily.    Dispense:  90 tablet    Refill:  1   Patient Instructions  Start prednisone as planned from pulmonary, but if continued worsening on that prednisone, you should be seen. Discuss other questions regarding your lung disease with pulmonary. Hope you feel better soon.  Return to the clinic or go to the nearest emergency room if any of your symptoms worsen or new symptoms occur. No change in meds for now.  Fasting labs at your convenience: [facility] in 8:30-4:30 during weekdays, no appointment needed [address]  [place] [id number]    Signed,   JMerri Ray MD LTice [facility] 08/23/22 10:14 AM

## 2022-08-23 NOTE — Telephone Encounter (Signed)
Spoke with pt who states he using Symbicort daily. Pt agreed to Prednisone taper and verified pharmacy as [facility] on [address]. Medication order was sent in. Pt states understanding. Nothing further needed at this time.

## 2022-08-23 NOTE — Patient Instructions (Addendum)
Start prednisone as planned from pulmonary, but if continued worsening on that prednisone, you should be seen. Discuss other questions regarding your lung disease with pulmonary. Hope you feel better soon.  Return to the clinic or go to the nearest emergency room if any of your symptoms worsen or new symptoms occur. No change in meds for now.  Fasting labs at your convenience: [facility] 8:30-4:30 during weekdays, no appointment needed [place].  [place], [place] [address]

## 2022-08-27 ENCOUNTER — Inpatient Hospital Stay: Payer: Medicare HMO | Attending: Internal Medicine

## 2022-08-27 ENCOUNTER — Inpatient Hospital Stay: Payer: Medicare HMO | Admitting: Internal Medicine

## 2022-08-27 ENCOUNTER — Other Ambulatory Visit: Payer: Self-pay

## 2022-08-27 VITALS — BP 128/59 | HR 86 | Temp 98.4°F | Resp 18 | Wt 161.6 lb

## 2022-08-27 DIAGNOSIS — D471 Chronic myeloproliferative disease: Secondary | ICD-10-CM | POA: Insufficient documentation

## 2022-08-27 DIAGNOSIS — Z9981 Dependence on supplemental oxygen: Secondary | ICD-10-CM | POA: Diagnosis not present

## 2022-08-27 DIAGNOSIS — J849 Interstitial pulmonary disease, unspecified: Secondary | ICD-10-CM | POA: Insufficient documentation

## 2022-08-27 DIAGNOSIS — C61 Malignant neoplasm of prostate: Secondary | ICD-10-CM | POA: Insufficient documentation

## 2022-08-27 DIAGNOSIS — Z79899 Other long term (current) drug therapy: Secondary | ICD-10-CM | POA: Insufficient documentation

## 2022-08-27 DIAGNOSIS — D72829 Elevated white blood cell count, unspecified: Secondary | ICD-10-CM | POA: Diagnosis not present

## 2022-08-27 DIAGNOSIS — R059 Cough, unspecified: Secondary | ICD-10-CM | POA: Insufficient documentation

## 2022-08-27 DIAGNOSIS — D649 Anemia, unspecified: Secondary | ICD-10-CM | POA: Diagnosis not present

## 2022-08-27 DIAGNOSIS — R062 Wheezing: Secondary | ICD-10-CM | POA: Insufficient documentation

## 2022-08-27 DIAGNOSIS — R161 Splenomegaly, not elsewhere classified: Secondary | ICD-10-CM | POA: Insufficient documentation

## 2022-08-27 DIAGNOSIS — R0602 Shortness of breath: Secondary | ICD-10-CM | POA: Insufficient documentation

## 2022-08-27 DIAGNOSIS — R5383 Other fatigue: Secondary | ICD-10-CM | POA: Insufficient documentation

## 2022-08-27 DIAGNOSIS — Z7952 Long term (current) use of systemic steroids: Secondary | ICD-10-CM | POA: Insufficient documentation

## 2022-08-27 DIAGNOSIS — D696 Thrombocytopenia, unspecified: Secondary | ICD-10-CM

## 2022-08-27 DIAGNOSIS — I252 Old myocardial infarction: Secondary | ICD-10-CM | POA: Diagnosis not present

## 2022-08-27 LAB — CBC WITH DIFFERENTIAL (CANCER CENTER ONLY)
Abs Immature Granulocytes: 2.31 10*3/uL — ABNORMAL HIGH (ref 0.00–0.07)
Basophils Absolute: 0.2 10*3/uL — ABNORMAL HIGH (ref 0.0–0.1)
Basophils Relative: 1 %
Eosinophils Absolute: 0 10*3/uL (ref 0.0–0.5)
Eosinophils Relative: 0 %
HCT: 33.7 % — ABNORMAL LOW (ref 39.0–52.0)
Hemoglobin: 10.5 g/dL — ABNORMAL LOW (ref 13.0–17.0)
Immature Granulocytes: 15 %
Lymphocytes Relative: 4 %
Lymphs Abs: 0.6 10*3/uL — ABNORMAL LOW (ref 0.7–4.0)
MCH: 30.2 pg (ref 26.0–34.0)
MCHC: 31.2 g/dL (ref 30.0–36.0)
MCV: 96.8 fL (ref 80.0–100.0)
Monocytes Absolute: 1.3 10*3/uL — ABNORMAL HIGH (ref 0.1–1.0)
Monocytes Relative: 8 %
Neutro Abs: 11.4 10*3/uL — ABNORMAL HIGH (ref 1.7–7.7)
Neutrophils Relative %: 72 %
Platelet Count: 223 10*3/uL (ref 150–400)
RBC: 3.48 MIL/uL — ABNORMAL LOW (ref 4.22–5.81)
RDW: 24.4 % — ABNORMAL HIGH (ref 11.5–15.5)
WBC Count: 15.7 10*3/uL — ABNORMAL HIGH (ref 4.0–10.5)
nRBC: 0.2 % (ref 0.0–0.2)

## 2022-08-27 LAB — CMP (CANCER CENTER ONLY)
ALT: 12 U/L (ref 0–44)
AST: 18 U/L (ref 15–41)
Albumin: 3.9 g/dL (ref 3.5–5.0)
Alkaline Phosphatase: 72 U/L (ref 38–126)
Anion gap: 8 (ref 5–15)
BUN: 18 mg/dL (ref 8–23)
CO2: 29 mmol/L (ref 22–32)
Calcium: 9.2 mg/dL (ref 8.9–10.3)
Chloride: 100 mmol/L (ref 98–111)
Creatinine: 0.79 mg/dL (ref 0.61–1.24)
GFR, Estimated: >60 mL/min (ref >60)
Glucose, Bld: 119 mg/dL — ABNORMAL HIGH (ref 70–99)
Potassium: 3.6 mmol/L (ref 3.5–5.1)
Sodium: 137 mmol/L (ref 135–145)
Total Bilirubin: 0.8 mg/dL (ref 0.3–1.2)
Total Protein: 6.6 g/dL (ref 6.5–8.1)

## 2022-08-27 LAB — IRON AND IRON BINDING CAPACITY (CC-WL,HP ONLY)
Iron: 46 ug/dL (ref 45–182)
Saturation Ratios: 20 % (ref 17.9–39.5)
TIBC: 227 ug/dL — ABNORMAL LOW (ref 250–450)
UIBC: 181 ug/dL (ref 117–376)

## 2022-08-27 LAB — FERRITIN: Ferritin: 415 ng/mL — ABNORMAL HIGH (ref 24–336)

## 2022-08-27 LAB — LACTATE DEHYDROGENASE: LDH: 352 U/L — ABNORMAL HIGH (ref 98–192)

## 2022-08-27 NOTE — Progress Notes (Signed)
[facility] Telephone:([phone]) [phone]   Fax:(336) [phone]  OFFICE PROGRESS NOTE  [name] Agreste, MD [address] [place] [address]  DIAGNOSIS:  1) myeloproliferative disorder presented [name] leukocytosis with positive JAK2 mutation  2) prostate adenocarcinoma currently under the care of Dr. [name] 3) interstitial lung disease under the care of [name]  PRIOR THERAPY:None.  CURRENT THERAPY: Hydroxyurea 500 mg p.o. daily.    INTERVAL HISTORY: [name] 82 y.o. male returns to the clinic today for follow-up visit accompanied by his wife.  The patient continues to complain of shortness of breath and he is currently on home oxygen.  He is currently on a tapered dose of prednisone by [name] for the interstitial lung disease.  He is currently on prednisone 40 mg p.o. daily but this will be reduced to 20 mg starting today to be tapered slowly over the next few weeks.  He denied having any chest pain but has mild cough and wheezing with no hemoptysis.  He has no nausea, vomiting, diarrhea or constipation.  He had 1 episode of nausea yesterday but that resolved spontaneously.  He continues to tolerate his treatment with hydroxyurea fairly well.  The patient is here today for evaluation and repeat blood work.  MEDICAL HISTORY: Past Medical History:  Diagnosis Date   Abnormal chest x-ray    Allergic rhinitis    Ankylosing spondylitis (HCC)    Anxiety    Arthritis    Colonic polyp    Coronary atherosclerosis of native coronary artery    Cough    Dizziness 10/21/2016   Ganglion cyst of wrist    left   Heart attack ([place]) 2008   Hemorrhoids    History of malaria    Hyperlipidemia    with low HDL   Hypertension    Kidney stone    Other disorder of muscle, ligament, and fascia    Rotator cuff tear    Swelling of lower extremity 10/16/2015    ALLERGIES:  is allergic to celecoxib and tramadol.  MEDICATIONS:  Current Outpatient  Medications  Medication Sig Dispense Refill   acetaminophen (TYLENOL) 325 MG tablet Take 650 mg by mouth every 6 (six) hours as needed for mild pain or moderate pain.     amLODipine (NORVASC) 2.5 MG tablet Take 1 tablet (2.5 mg total) by mouth daily. 90 tablet 1   Apoaequorin (PREVAGEN PO) Take 1 tablet by mouth daily.      aspirin 81 MG EC tablet Take 81 mg by mouth daily.     budesonide-formoterol (SYMBICORT) 80-4.5 MCG/ACT inhaler Inhale 2 puffs into the lungs 2 (two) times daily. 1 each 3   Cholecalciferol (VITAMIN D3) 1.25 MG (50000 UT) CAPS Take 1 capsule by mouth daily. Pt does not know dose.     famotidine (PEPCID) 20 MG tablet Take 20 mg by mouth daily.     hydroxyurea (HYDREA) 500 MG capsule Take 1 capsule (500 mg total) by mouth daily. May take with food to minimize GI side effects. 30 capsule 3   Multiple Vitamins-Minerals (ICAPS AREDS 2) CAPS Take by mouth daily.     pantoprazole (PROTONIX) 40 MG tablet Take 1 tablet (40 mg total) by mouth 2 (two) times daily. 60 tablet 0   predniSONE (DELTASONE) 10 MG tablet Take 4 tablets (40 mg total) by mouth daily with breakfast for 1 day, THEN 3 tablets (30 mg total) daily with breakfast for 1 day, THEN  2 tablets (20 mg total) daily with breakfast for 1 day, THEN 1 tablet (10 mg total) daily with breakfast for 1 day, THEN 0.5 tablets (5 mg total) daily with breakfast for 1 day. 10.5 tablet 0   simvastatin (ZOCOR) 20 MG tablet TAKE 1 TABLET EVERY DAY WITH BREAKFAST (APPOINTMENT IS NEEDED FOR REFILLS) 90 tablet 3   UNABLE TO FIND Med Name: CPAP per Dr Halford Chessman     No current facility-administered medications for this visit.    SURGICAL HISTORY:  Past Surgical History:  Procedure Laterality Date   bicep rupture     CORONARY ANGIOPLASTY WITH STENT PLACEMENT     HEMORRHOID SURGERY  05/2009   in [facility]   LEFT HEART CATH AND CORONARY ANGIOGRAPHY N/A 01/14/2017   Procedure: Left Heart Cath and Coronary Angiography;  Surgeon: [name], MD;   Location: [facility];  Service: Cardiovascular;  Laterality: N/A;   LEFT HEART CATHETERIZATION WITH CORONARY ANGIOGRAM N/A 09/03/2012   Procedure: LEFT HEART CATHETERIZATION WITH CORONARY ANGIOGRAM;  Surgeon: [name], MD;  Location: [facility];  Service: Cardiovascular;  Laterality: N/A;   PROSTATE BIOPSY     ROTATOR CUFF REPAIR     bilat by Dr. French Ana    REVIEW OF SYSTEMS:  A comprehensive review of systems was negative except for: Constitutional: positive for fatigue Respiratory: positive for cough, dyspnea on exertion, and wheezing   PHYSICAL EXAMINATION: General appearance: alert, cooperative, fatigued, and no distress Head: Normocephalic, without obvious abnormality, atraumatic Neck: no adenopathy Lymph nodes: Cervical, supraclavicular, and axillary nodes normal. Resp: clear to auscultation bilaterally Back: symmetric, no curvature. ROM normal. No CVA tenderness. Cardio: regular rate and rhythm, S1, S2 normal, no murmur, click, rub or gallop GI: soft, non-tender; bowel sounds normal; no masses,  no organomegaly Extremities: extremities normal, atraumatic, no cyanosis or edema  ECOG PERFORMANCE STATUS: 1 - Symptomatic but completely ambulatory  Blood pressure (!) 128/59, pulse 86, temperature 98.4 F (36.9 C), temperature source Oral, resp. rate 18, weight 161 lb 9.6 oz (73.3 kg), SpO2 93 %.  LABORATORY DATA: Lab Results  Component Value Date   WBC 15.7 (H) 08/27/2022   HGB 10.5 (L) 08/27/2022   HCT 33.7 (L) 08/27/2022   MCV 96.8 08/27/2022   PLT 223 08/27/2022      Chemistry      Component Value Date/Time   NA 141 07/22/2022 0433   NA 141 01/03/2020 0832   NA 141 04/21/2017 0823   K 3.9 07/22/2022 0433   K 4.0 04/21/2017 0823   CL 102 07/22/2022 0433   CL 106 09/30/2012 0949   CO2 26 07/22/2022 0433   CO2 25 04/21/2017 0823   BUN 24 (H) 07/22/2022 0433   BUN 20 01/03/2020 0832   BUN 20.2 04/21/2017 0823   CREATININE 1.04 07/22/2022 0433    CREATININE 0.78 07/17/2022 0759   CREATININE 0.9 04/21/2017 0823      Component Value Date/Time   CALCIUM 9.3 07/22/2022 0433   CALCIUM 9.6 04/21/2017 0823   ALKPHOS 87 07/20/2022 1903   ALKPHOS 61 04/21/2017 0823   AST 25 07/20/2022 1903   AST 23 07/17/2022 0759   AST 22 04/21/2017 0823   ALT 19 07/20/2022 1903   ALT 18 07/17/2022 0759   ALT 36 04/21/2017 0823   BILITOT 0.8 07/20/2022 1903   BILITOT 0.7 07/17/2022 0759   BILITOT 0.99 04/21/2017 0823     BONE MARROW REPORT FINAL DIAGNOSIS RADIOGRAPHIC STUDIES: No results found.  ASSESSMENT AND  PLAN:  This is a very pleasant 82  years old white male with history of ITP and now presented with JAK2 positive myeloproliferative disorder.[name] He presented recently with leukocytosis and splenomegaly. I explained to the patient that she has evidence for myeloproliferative disorder based on the JAK2 mutation and the presentation with leukocytosis and splenomegaly.  FISH study for BCR/ABL was negative. The patient started treatment with hydroxyurea 500 mg p.o. daily. He has been tolerating this treatment fairly well with no concerning adverse effects. Repeat CBC today showed improvement of his white blood count down to 15.7 but he is currently on prednisone and may be contributing to his persistent leukocytosis. He has mild anemia and normal platelet count. I recommended for the patient to continue his current treatment with hydroxyurea with the same dose. I will see him back for follow-up visit in 6 weeks for evaluation and repeat blood work. For the interstitial lung disease he is followed by [name] and currently on treatment with a tapered dose of prednisone. The patient was advised to call immediately if he has any other concerning symptoms in the interval.  All questions were answered. The patient knows to call the clinic with any problems, questions or concerns. We can certainly see the patient much sooner if  necessary.  Disclaimer: This note was dictated with voice recognition software. Similar sounding words can inadvertently be transcribed and may not be corrected upon review.

## 2022-09-02 ENCOUNTER — Encounter: Payer: Self-pay | Admitting: Cardiovascular Disease

## 2022-09-02 ENCOUNTER — Encounter: Payer: Self-pay | Admitting: Internal Medicine

## 2022-09-02 ENCOUNTER — Telehealth: Payer: Self-pay | Admitting: Cardiovascular Disease

## 2022-09-02 NOTE — Telephone Encounter (Signed)
Pt states he received a letter from the [place] about a disability claim. He states they received the medical records, however they are requesting a letter or some form of documentation explaining his cardiac condition including diagnosis codes.

## 2022-09-02 NOTE — Telephone Encounter (Signed)
See MyChart message

## 2022-09-03 ENCOUNTER — Telehealth: Payer: Self-pay | Admitting: Internal Medicine

## 2022-09-04 NOTE — Telephone Encounter (Signed)
Called patient and he states that he is wanting an office visit with MR in February. But I looked and MR schedule is only out till 09/25/22. I told patient that he can call back next week to see if we opened the rest of his schedule. He was upset and no understanding why I could not scheduled him now. All of MR days so far are full. But he told me that this was unacceptable and that he wants to see him asap due to his sleeping issues. Patient has not had cpap titration study yet. But I told patient I would inform MR of his call.   FYI sir.

## 2022-09-05 ENCOUNTER — Telehealth: Payer: Self-pay | Admitting: Internal Medicine

## 2022-09-05 ENCOUNTER — Telehealth (INDEPENDENT_AMBULATORY_CARE_PROVIDER_SITE_OTHER): Payer: Medicare HMO | Admitting: Internal Medicine

## 2022-09-05 ENCOUNTER — Other Ambulatory Visit (HOSPITAL_COMMUNITY): Payer: Self-pay

## 2022-09-05 DIAGNOSIS — J9611 Chronic respiratory failure with hypoxia: Secondary | ICD-10-CM

## 2022-09-05 DIAGNOSIS — J679 Hypersensitivity pneumonitis due to unspecified organic dust: Secondary | ICD-10-CM

## 2022-09-05 DIAGNOSIS — J849 Interstitial pulmonary disease, unspecified: Secondary | ICD-10-CM

## 2022-09-05 NOTE — Telephone Encounter (Signed)
Rx team  Patient having hard time paying for ofev. [employer] benefits. Can yoiuy help please?   Thanks    SIGNATURE    Dr. Brand Males, M.D., F.C.C.P,  Pulmonary and Critical Care Medicine Staff Physician, [place] Director - Interstitial Lung Disease  Program  Medical Director - [facility] [place] at [place], [place], [address]   Pager: [phone], If no answer  -[place] or [place] [phone] Telephone (clinical office): [phone] Telephone (research): [phone]  8:57 AM 09/05/2022

## 2022-09-05 NOTE — Patient Instructions (Addendum)
ILD (interstitial lung disease) ([place]) Long-term exposure involving bird droppings   - Our working diagnosis is chronic HP based on bird exposure and CT scan. But your agent orange exposure puts you at  risk for ILD  - it is progressive; 25% decline in lung function in 5 years - dec 2023 admission for ILD flare up v Diast CHF with cellulitis - 09/05/2022 - fibrosis flare up versus progression after coming off prednisone taper -   - this makes it more likely you have HP and not IPF   Plan -No bird exposure ever - Please take Take prednisone '40mg'$  once daily x 7 days, then '30mg'$  once daily x 7 days, then '20mg'$  once daily x 7 days, then prednisone '10mg'$  once daily to cotninue until further notice - keep up PFT 09/16/22  - see Derl Barrow or Tammy Parrett after PFT on 25/24 - - at followup visit need walking desaturation test - continue portable oxygen system; goal pulse ox > 88%- -Continue nighttime oxygen 2 L - start OFev  - sent message to our Rx team to see what they can do again abou tit  Chronic cough History of dust mite allergy 2018 Runny nose   -December 2023 allergy panel positive for dust mite, weeed and also trees - 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 in 4-6 weeks; 30 min visit  - symptoms score and walk test at followup

## 2022-09-05 NOTE — Progress Notes (Signed)
OV 04/30/2022 -transferring from Dr. Christinia Gully to [name] for ILD.  Subjective:  Patient ID: [name], male , DOB: [birth year] , age 82 y.o. , MRN: [id number] , ADDRESS: [place] [address] PCP [name], MD Patient Care Team: [name], MD as PCP - General (Family Medicine) [name], MD as PCP - Cardiology (Cardiology)  This Provider for this visit: Treatment Team:  Attending Provider: Brand Males, MD    04/30/2022 -   Chief Complaint  Patient presents with   Consult    Dry cough and wheezing at times      HPI [name] 82 y.o. - referred by DR Bo Merino over concerns of ILD.  He saw rheumatology Dr. Lorelee Cover on 03/28/2022.  This is because of stiffness in his joints neck and back..  Diagnosed with degenerative disc disease and osteoarthritis.  In July 2023 he got COVID 19.  Treated as outpatient.  After that he started having some shortness of breath.  When rheumatology reviewed this history in August 2023 they note his previous reports of ILD on CT scan of the chest particularly high-res April 2018.  Last pulmonary function test was in December 2020.  Autoimmune labs were ordered.  This is come back normal and this referral was made.  Therefore is here.  To me he is reporting only cough.  He has chronic cough for the last 5 years.  There are some wheezing with a cough.  He does cough at night he does bring up some sputum that is clear or light yellow.  For the last 1 week his voice is strained.  He has never had hemoptysis.  He does cough when he lies down.  It does affect his voice.  He does clear the throat.  There is no tickle in the back of the throat.  There is barely any shortness of breath except   [place] [facility] Comprehensive ILD Questionnaire  Symptoms:  Reports chronic cough for the last 5 years.    Past Medical History :  = He has a history of prostate cancer that is early - He has  seen Dr. Melvern Sample for low platelets and apparently this resolved - he has chronic mild anemia 12gm% since sept 2023 - MI in 2008 and status post stents - He has chronic cough and is on PPI and H2 blockade.  But he says he does not have any active acid reflux - She did check he has for rheumatoid arthritis but rheumatology notes indicate DJD - He does have sleep apnea uses CPAP. - She had infectious mononucleosis in college in 1960s - She did mark positive for COVID vaccine and COVID disease in July 2023.  He was not hospitalized for COVID -     DIAGNOSIS: OCt 2023 1) myeloproliferative disorder presented [name] leukocytosis with positive JAK2 mutation  with high WBC and splenomegaly - Rx hydroyurea OCt 2023 2) prostate adenocarcinoma currently under the care of Dr. Delrae Sawyers 3) JAK2 mutation was reported to be abnormal consistent with myeloproliferative disorde . FISH study for BCR/ABL was negative.       ROS:  -Positive for fatigue - Positive for arthralgia - 10 pound unintentional weight loss in the last 6 months because of wife cardiac illness.  But he is feeling better because of this - Nonspecific rash  FAMILY HISTORY of LUNG DISEASE:  -Both parents had COPD.  Mother had asthma.  Son at sarcoid.  Rest of family history  is negative.*  PERSONAL EXPOSURE HISTORY:  -Never smoked cigarettes but he did do some passive smoking in the past.  No cigar use no marijuana use no pipe use.  No cocaine use no intravenous drug use  HOME  EXPOSURE and HOBBY DETAILS :  -Lives in a 80 year old home for the last 15 years.  He does have a [place] when he goes on vacation but this only once a year.  He does have a cockatiel bird in his office for the last 20 years.  He spent several hours in a week in that office.  He also cleans the droppings and feathers from the bird cage.  There is no mold anywhere in the system.  He plays a Microbiologist harmonica there is no mold or mildew in this.  OCCUPATIONAL  HISTORY (122 questions) : -He wants rental property.  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  Other than that he has a bird exposure.  -0 he also has a [place] War veteran and is believed to have been exposed to agent orange.  PULMONARY TOXICITY HISTORY (27 items):  -Denies  INVESTIGATIONS: Below Pulmonary function test shows progression between 22018 and 2020  High-resolution CT chest from several years ago in 2018 nonspecific ILD changes but seems more prominent on the CT abdomen lung cuts August 2022.  [Personally visualized        CT Chest data abd lung image Aug 2022  IMPRESSION: 1. No non-contrast CT findings of the abdomen or pelvis to explain right lower quadrant pain. No evidence of abdominal hernia. 2. Normal appendix. 3. No evidence of urinary tract calculus or hydronephrosis. 4. Mild prostatomegaly with thickening of the urinary bladder wall, likely related to chronic outlet obstruction. 5. Moderate fibrotic scarring of the included bilateral lung bases, which appears somewhat increased compared to prior examination 05/19/2017. Consider pulmonary referral and dedicated chest interstitial lung disease CT of the chest to further evaluate. 6. Coronary artery disease.   Aortic Atherosclerosis (ICD10-I70.0).     Electronically Signed   By: Eddie Candle M.D.   On: 04/02/2021 13:05  No results found.        OV 06/25/2022  Subjective:  Patient ID: [name], male , DOB: 1941-07-28 , age 82 y.o. , MRN: [id number] , ADDRESS: [place] [address] PCP [name], MD Patient Care Team: [name], MD as PCP - General (Family Medicine) [name], MD as PCP - Cardiology (Cardiology)  This Provider for this visit: Treatment Team:  Attending Provider: Brand Males, MD    06/25/2022 -   Chief Complaint  Patient  presents with   Follow-up    Pt states he has had a cough that is not going away and also states that he has had postnasal drainage. States his cough is worse in the mornings and also at night when laying down for bed. States he will cough up a lot of clear phlegm in the mornings.   History of sleep apnea on CPAP [Dr. Elisabeth Cara Sood]  Follow-up interstitial lung disease: Progressive phenotype [IPF versus chronic HP)  -Features of IPF include age greater than 22, Caucasian, male gender, occupation Dispensing optician work  -Features for chronic HP include: Having a Loss adjuster, chartered bird in the house.  HPI [name] 82 y.o. -returns for follow-up.  He is here to discuss his work-up.  His high-resolution CT chest is described as definitive UIP and with definitive progression.  There is also progression his pulmonary function test.  25% decline in the last 5 years averaging 5% decline in FVC per year.  His serology is negative.  This along with age greater than 16, Velcro crackles in the lung base, white male would make him have a diagnosis of IPF.  However on my personal visualization while I do agree with progression I am not sure about the definite of UIP features.  In addition there is?  Specks of pleural calcification.  This would raise the question of asbestosis in the context of exposure of bird question of hypersensitive pneumonitis is there.  I have therefore sought the opinion of Dr. Yetta Glassman radiologist via email.  There are several issues to deal with at this visit  -New diagnosis of myeloproliferative disorder with JAK2 mutation October 2023 by Dr. [name].  Started on hydroxyurea.  Sophia is tolerating it well.  Is been on it for 3 weeks.  No nausea but has been given some Zofran for this or Compazine.  -He is also having epistaxis through the right nostril for the last several months.  At least 1 time per week.  It is intermittent.  Sometimes it is every other week.  No specific trigger factor other  than the fact his nose does feel dry with the CPAP.  He does not use any saline nasal spray.  But it is definitely new.  He is on aspirin.  -Cough: This is worse compared to the last visit.  It is there early in the morning and later in the evening.  Associated with sinus drainage.  He is on chlorpheniramine tablets but it is not working.  In the past he has had RAST allergy profile his blood eosinophils were normal.  He does have some house dust mite allergy and that was the only positive test but otherwise normal.  He does take some cough drops and it does help.  He also feels the cough is stuck in the throat and associated with sinus drainage.  He has never seen the ENT doctor  -Shortness of breath: He had new onset insidious onset of shortness of breath with exertion relieved by rest.  Severe it is listed below.  It is not the last visit.  No associated chest pain  -Unintentional weight loss for the last 6 months of at least 10 pounds.  Current weight is 164 pounds.   HRCT 05/04/22  Narrative & Impression  CLINICAL DATA:  Interstitial lung disease.   EXAM: CT CHEST WITHOUT CONTRAST   TECHNIQUE: Multidetector CT imaging of the chest was performed following the standard protocol without intravenous contrast. High resolution imaging of the lungs, as well as inspiratory and expiratory imaging, was performed.   RADIATION DOSE REDUCTION: This exam was performed according to the departmental dose-optimization program which includes automated exposure control, adjustment of the mA and/or kV according to patient size and/or use of iterative reconstruction technique.   COMPARISON:  11/26/2016.   FINDINGS: Cardiovascular: Atherosclerotic calcification of the aorta, aortic valve and coronary arteries. Enlarged pulmonary trunk and heart. No pericardial effusion.   Mediastinum/Nodes: Mediastinal lymph nodes measure up to 10 mm in the low right paratracheal station, as before. Hilar regions  are difficult to evaluate without IV contrast. No axillary adenopathy. Esophagus is grossly unremarkable.   Lungs/Pleura: Peripheral and basilar predominant subpleural reticulation, traction bronchiectasis/bronchiolectasis, ground-glass and single layer honeycombing. Findings have progressed from 11/26/2016.  Calcified granulomas. No suspicious pulmonary nodules. No pleural fluid. Airway is unremarkable. Minimal trapping.   Upper Abdomen: Right hemidiaphragm is elevated. 10 mm low-attenuation lesion in the right hepatic lobe, too small to characterize but likely a cyst. Visualized portions of the liver, gallbladder, adrenal glands and right kidney are unremarkable. Spleen appears enlarged but is incompletely imaged. Visualized portions of the pancreas, stomach and bowel are unremarkable with the exception of a small hiatal hernia. No upper abdominal adenopathy.   Musculoskeletal: Degenerative changes in the spine. No worrisome lytic or sclerotic lesions.   IMPRESSION: 1. Pulmonary parenchymal pattern of fibrosis, as detailed above, progressive from 11/16/2016. Findings are consistent with UIP per consensus guidelines: Diagnosis of Idiopathic Pulmonary Fibrosis: An Official ATS/ERS/JRS/ALAT Clinical Practice Guideline. [place], Iss 5, [phone], Apr 12 2017. 2. Minimal air trapping indicative of small airways disease. 3. Spleen appears enlarged but is incompletely imaged. 4. Aortic atherosclerosis (ICD10-I70.0). Coronary artery calcification. 5. Enlarged pulmonic trunk, indicative of pulmonary arterial hypertension.     Electronically Signed   By: Lorin Picket M.D.   On: 05/06/2022 10:39    Latest Reference Range & Units 04/24/22 14:24  Anti Nuclear Antibody (ANA) NEGATIVE  NEGATIVE  Cyclic Citrullin Peptide Ab UNITS <16  ds DNA Ab IU/mL <1  RA Latex Turbid. <14 IU/mL <14  ENA SM Ab Ser-aCnc <1.0 NEG AI <1.0 NEG  C3 Complement 82 - 185 mg/dL 136   C4 Complement 15 - 53 mg/dL 44  Ribonucleic Protein(ENA) Antibody, IgG <1.0 NEG AI <1.0 NEG  SSA (Ro) (ENA) Antibody, IgG <1.0 NEG AI <1.0 NEG  SSB (La) (ENA) Antibody, IgG <1.0 NEG AI <1.0 NEG  Scleroderma (Scl-70) (ENA) Antibody, IgG <1.0 NEG AI <1.0 NEG   ECHO 05/01/22   IMPRESSIONS     1. Left ventricular ejection fraction, by estimation, is 60 to 65%. The  left ventricle has normal function. The left ventricle has no regional  wall motion abnormalities. Left ventricular diastolic parameters were  normal.   2. Right ventricular systolic function is normal. The right ventricular  size is normal. There is normal pulmonary artery systolic pressure. The  estimated right ventricular systolic pressure is 84.1 mmHg.   3. The mitral valve is normal in structure. Trivial mitral valve  regurgitation. No evidence of mitral stenosis.   4. The aortic valve is tricuspid. Aortic valve regurgitation is not  visualized. Aortic valve sclerosis is present, with no evidence of aortic  valve stenosis.   5. The inferior vena cava is normal in size with greater than 50%  respiratory variability, suggesting right atrial pressure of 3 mmHg.    INPATIENT 07/21/22     82 year old male who I saw for the first time in September 2023 for interstitial lung disease.  At that time his symptoms are chronic cough there was concern of progressive phenotype because he had already had ILD.  He had a family history of COPD.  He never smoked cigarettes but did do some passive smoking.  He was in the [place] War and had agent orange exposure.  He worked in Dispensing optician.  All this along with age greater than 75 and Caucasian put him at risk for IPF.  However he did have bird exposure at this house for many years.  I saw him back in November 2023.  High-resolution CT chest thoracic radiology opinion more consistent with hypersensitive pneumonitis consistent with his bird exposure..  He has had progression on both CT scan  of  the chest and pulmonary function test [see below] just prior to Thanksgiving 2023 indicated him to get rid of the birds and consider antifibrotic nintedanib. He did get rid of the bird   However around this time he started calling into the office complaining of some mild onset hemoptysis [currently not an issue] and at this ER visit is reporting 2 to 3-week worsening dyspnea and cough and 2 days of worsening pedal edema.  He had new onset hypoxemia at rest.  Was hypoxemic requiring few liters nasal cannula.  According to the hospitalist he has responded to Lasix.  After arrival he has developed bilateral distal one third erythema on both shin along with warmth and tenderness to touch but no crepitus.  He is on Rocephin and azithromycin.   His BNP, troponin respiratory virus panel urine strep are negative his CT angiogram chest is negative for PE.  His echocardiogram shows good ejection fraction and no evidence of RV strain.  But he is definitely hypoxemic.   Pulmonary has been consulted.  Of note he is yet to start his antifibrotic.   OV 07/29/2022  Subjective:  Patient ID: [name], male , DOB: 1940/12/21 , age 89 y.o. , MRN: [id number] , ADDRESS: [place] [address] PCP [name], MD Patient Care Team: [name], MD as PCP - General (Family Medicine) [name], MD as PCP - Cardiology (Cardiology)  This Provider for this visit: Treatment Team:  Attending Provider: Brand Males, MD    07/29/2022 -   Chief Complaint  Patient presents with   Follow-up    Pt is here for follow up for ILD. Pt states he saw MR in hospital last week. Pt states he is having no energy. Pt states without oxygen his oxygen drops. Pt I s on prednsione currenlty. Pt thinks he is done with bactrim as of today.     HPI [name] 82 y.o. -returns for follow-up.  I am meeting his wife [name] for the first time.  After last visit I discussed with  radiologist.  CT scan was more consistent with hypersensitive pneumonitis and given his bird exposure we gave him this diagnosis and committed him to nintedanib but he got hospitalized with respiratory flareup.  Final diagnosis of volume overload diastolic heart failure versus pulmonary fibrosis flareup.  He is on a 3-week prednisone taper.  He is completed 1 week.  He has 2 more weeks left.  Is also on Bactrim for PJP prophylaxis.  In the interim he is got rid of the bird.  At the hospitalization he also had cellulitis and this is resolved.  He is much improved after the hospitalization.  He is no longer hypoxemic at rest but walking desaturation test did show hypoxemia [see below].  Other issues - We did check his RAST allergy panel and is positive for a number of environmental things.  This could partially explain his cough - Cellulitis: Resolved - Treatment: He his insurance is not covering to cover his nintedanib co-pay.  It is extremely expensive for him.  His income level is above the level for charity.  He is applied for VA benefits he might qualify.  He wants me to do a letter that he has ILD.  [facility] to the [facility] -Weight loss: Last visit he had weight loss this needs to be tracked -Preoperative respiratory assessment: He has upcoming anal surgery.  This is later this month.  I  did advise him that given cellulitis and recent respiratory exacerbation to wait at least 4 weeks from the time of admission before he can have surgery. - Logistics: He wants his DME company notified so he can bleed his oxygen through the CPAP at night and he also wants a portable unit.            OV 09/05/2022  Subjective:  Patient ID: [name], male , DOB: Nov 08, 1940 , age 39 y.o. , MRN: [id number] , ADDRESS: [place] [address] PCP [name], MD Patient Care Team: [name], MD as PCP - General (Family Medicine) [name], MD as PCP -  Cardiology (Cardiology)  This Provider for this visit: Treatment Team:  Attending Provider: Brand Males, MD    09/05/2022 -   Chief Complaint  Patient presents with   Follow-up    Pt is wanting to talk to MR about his sleep issues, cpap with oxygen etc.    Type of visit: Video Virtual Visit Identification of patient [name] with 1940-08-16 and MRN [id number] - 2 person identifier Risks: Risks, benefits, limitations of telephone visit explained. Patient understood and verbalized agreement to proceed Anyone else on call: wife Patient location: his home This provider location: [address]; [place]; [place] [address]. [facility]. [phone]    Follow-up interstitial lung disease hypersensitive pneumonitis versus IPF (final analysis hypersensitive pneumonitis]  -Progressive phenotype  -Bird exposure RAST allergy test + December 2023  -Dust mites, ragweed, tree  HPI [name] 82 y.o. -acute visit. Worsening cough. X 1 week.  Baseline  3 of 5. Now cough is 5 of 5. Severe cough. Desaturates when cough and immedialy after that pulse ox in 80s esp at night while on 2L. Also esay desats during walk - almost immediately. Will have to rest to increase upto 90%. No fever, Clear sputum +  - AL LOT of sputum +.  HE finished his hospital prednisone > 1 week ago . Worsening started after that. STill not on ofev or esbriet. Cost issues and VA coverage +. Says in past has had hard time doing PFTs. Symbicort not helping.      SYMPTOM SCALE - ILD 04/30/2022 06/25/2022 164 pounds 07/29/2022 160# - recent admission dec 2023 for ILD flare v d-cHF and cellulitis  Current weight     O2 use ra ra   Shortness of Breath 0 -> 5 scale with 5 being worst (score 6 If unable to do)    At rest 0 0   Simple tasks - showers, clothes change, eating, shaving 0 1   Household (dishes, doing bed, laundry) 0 0   Shopping 0 0   Walking level at own pace 0 1   Walking  up Stairs 0 1   Total (30-36) Dyspnea Score 0 3       Non-dyspnea symptoms (0-> 5 scale) 04/30/2022 06/25/2022   How bad is your cough? 2 3  How bad is your fatigue 2 2  How bad is nausea 0 0  How bad is vomiting?  0 0  How bad is diarrhea? 0 0  How bad is anxiety? 0 0  How bad is depression 0 0  Any chronic pain - if so where and how bad 2 0       Simple office walk 185 feet x  3 laps goal with forehead probe 04/30/2022  07/29/2022   O2 used ra ra  Number laps completed 3 1 of 3  Comments about pace avg avg  Resting Pulse Ox/HR 98% and 75/min 94% and HR 88  Final Pulse Ox/HR 95% and 99/min 86% and  HR 97  Desaturated </= 88% no yes  Desaturated <= 3% points yes yes  Got Tachycardic >/= 90/min yes yes  Symptoms at end of test non none  Miscellaneous comments x WORSE        PFT     Latest Ref Rng & Units 06/05/2022    3:46 PM 07/29/2019    8:58 AM 10/17/2016    9:59 AM  PFT Results  FVC-Pre L 2.01  2.57  2.73   FVC-Predicted Pre % 56  70  73   FVC-Post L 2.01  2.60  2.76   FVC-Predicted Post % 56  71  74   Pre FEV1/FVC % % 87  87  88   Post FEV1/FCV % % 92  90  87   FEV1-Pre L 1.74  2.23  2.39   FEV1-Predicted Pre % 69  85  89   FEV1-Post L 1.85  2.34  2.40   DLCO uncorrected ml/min/mmHg 11.37  15.24  16.71   DLCO UNC% % 51  68  60   DLCO corrected ml/min/mmHg 12.63   16.07   DLCO COR %Predicted % 57   58   DLVA Predicted % 91  98  90   TLC L 3.65  3.89  4.37   TLC % Predicted % 57  61  69   RV % Predicted % 61  53  59        has a past medical history of Abnormal chest x-ray, Allergic rhinitis, Ankylosing spondylitis (HCC), Anxiety, Arthritis, Colonic polyp, Coronary atherosclerosis of native coronary artery, Cough, Dizziness (10/21/2016), Ganglion cyst of wrist, Heart attack ([place]) (2008), Hemorrhoids, History of malaria, Hyperlipidemia, Hypertension, Kidney stone, Other disorder of muscle, ligament, and fascia, Rotator cuff tear, and Swelling of  lower extremity (10/16/2015).   reports that he has never smoked. He has never been exposed to tobacco smoke. He has never used smokeless tobacco.  Past Surgical History:  Procedure Laterality Date   bicep rupture     CORONARY ANGIOPLASTY WITH STENT PLACEMENT     HEMORRHOID SURGERY  05/2009   in [facility]   LEFT HEART CATH AND CORONARY ANGIOGRAPHY N/A 01/14/2017   Procedure: Left Heart Cath and Coronary Angiography;  Surgeon: [name], MD;  Location: [facility];  Service: Cardiovascular;  Laterality: N/A;   LEFT HEART CATHETERIZATION WITH CORONARY ANGIOGRAM N/A 09/03/2012   Procedure: LEFT HEART CATHETERIZATION WITH CORONARY ANGIOGRAM;  Surgeon: [name], MD;  Location[facility] C[facility] LAB;  Service: Cardiovascular;  Laterality: N/A;   PROSTATE BIOPSY     ROTATOR CUFF REPAIR     bilat by Dr. French Ana    Allergies  Allergen Reactions   Celecoxib Other (See Comments)    Pt states precipitated his heart attack.   Tramadol Other (See Comments)    Makes patient jumpy    Immunization History  Administered Date(s) Administered   Fluad Quad(high Dose 65+) 05/01/2019, 05/28/2021, 05/01/2022   Influenza Split 06/01/2012   Influenza Whole 05/30/2009, 04/20/2010   Influenza, High Dose Seasonal PF 05/19/2017, 05/19/2018   Influenza-Unspecified 05/13/2007, 07/12/2013, 05/10/2016, 06/23/2020   PFIZER(Purple Top)SARS-COV-2 Vaccination 09/18/2019, 10/09/2019, 07/13/2020   PNEUMOCOCCAL CONJUGATE-20 01/14/2022   Pneumococcal Conjugate-13 05/19/2018   Pneumococcal Polysaccharide-23 08/22/2009   Tdap 12/04/2010, 06/04/2011, 07/31/2018   Zoster Recombinat (Shingrix) 01/14/2022  Zoster, Live 05/17/2008   Zoster, Unspecified 04/17/2022    Family History  Problem Relation Age of Onset   Cancer Mother        melanoma   Emphysema Mother    Rheum arthritis Mother    Diabetes Mother    Alcohol abuse Father      Current Outpatient Medications:    acetaminophen (TYLENOL) 325 MG tablet,  Take 650 mg by mouth every 6 (six) hours as needed for mild pain or moderate pain., Disp: , Rfl:    amLODipine (NORVASC) 2.5 MG tablet, Take 1 tablet (2.5 mg total) by mouth daily., Disp: 90 tablet, Rfl: 1   Apoaequorin (PREVAGEN PO), Take 1 tablet by mouth daily. , Disp: , Rfl:    aspirin 81 MG EC tablet, Take 81 mg by mouth daily., Disp: , Rfl:    budesonide-formoterol (SYMBICORT) 80-4.5 MCG/ACT inhaler, Inhale 2 puffs into the lungs 2 (two) times daily., Disp: 1 each, Rfl: 3   Cholecalciferol (VITAMIN D3) 1.25 MG (50000 UT) CAPS, Take 1 capsule by mouth daily. Pt does not know dose., Disp: , Rfl:    famotidine (PEPCID) 20 MG tablet, Take 20 mg by mouth daily., Disp: , Rfl:    hydroxyurea (HYDREA) 500 MG capsule, Take 1 capsule (500 mg total) by mouth daily. May take with food to minimize GI side effects., Disp: 30 capsule, Rfl: 3   Multiple Vitamins-Minerals (ICAPS AREDS 2) CAPS, Take by mouth daily., Disp: , Rfl:    simvastatin (ZOCOR) 20 MG tablet, TAKE 1 TABLET EVERY DAY WITH BREAKFAST (APPOINTMENT IS NEEDED FOR REFILLS), Disp: 90 tablet, Rfl: 3   UNABLE TO FIND, Med Name: CPAP per Dr Halford Chessman, Disp: , Rfl:    pantoprazole (PROTONIX) 40 MG tablet, Take 1 tablet (40 mg total) by mouth 2 (two) times daily., Disp: 60 tablet, Rfl: 0      Objective:   There were no vitals filed for this visit.  Estimated body mass index is 25.31 kg/m as calculated from the following:   Height as of 08/23/22: '5\' 7"'$  (1.702 m).   Weight as of 08/27/22: 161 lb 9.6 oz (73.3 kg).  '@WEIGHTCHANGE'$ @  There were no vitals filed for this visit.   Physical Exam - sounded normal - on o2  - looked normal  - pleasant      Assessment:       ICD-10-CM   1. ILD (interstitial lung disease) ([place])  J84.9     2. Chronic respiratory failure with hypoxia (HCC)  J96.11     3. 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  And otherwise he is prednisone responsive interstitial lung disease.  This would be consistent with the diagnosis of hypersensitivity pneumonitis that be made is the most likely provisional diagnosis.  This adds to the pretest probability for that.  I think he needs prednisone.  I did explain to him about the side effects of prednisone.  Indicated that several patients with hypersensitivity pneumonitis are dependent on prednisone/required prednisone long-term.  However my experience has been that we could taper them to 10 mg/day.  Therefore we will start at 40 mg and then taper them over several weeks to 10 mg.  He does need antifibrotic support.  I have again reached out to our pharmacy team for this.  He does not like to do PFTs but I strongly suggested  he keep the appointment for February 5 and see nurse practitioner.  He will need to be walk to see if he is having increasing oxygen requirements  I did advise him to stay in touch with our practice on-call system and if he is getting worse he is to go to the ER. Plan:     Patient Instructions  ILD (interstitial lung disease) ([place]) Long-term exposure involving bird droppings   - Our working diagnosis is chronic HP based on bird exposure and CT scan. But your agent orange exposure puts you at  risk for ILD  - it is progressive; 25% decline in lung function in 5 years - dec 2023 admission for ILD flare up v Diast CHF with cellulitis - 09/05/2022 - fibrosis flare up versus progression after coming off prednisone taper -   - this makes it more likely you have HP and not IPF   Plan -No bird exposure ever - Please take Take prednisone '40mg'$  once daily x 7 days, then '30mg'$  once daily x 7 days, then '20mg'$  once daily x 7 days, then prednisone '10mg'$  once daily to cotninue until further notice - keep up PFT 09/16/22  - see Derl Barrow or Tammy Parrett after PFT on 25/24 - - at followup visit need walking desaturation test - continue  portable oxygen system; goal pulse ox > 88%- -Continue nighttime oxygen 2 L - start OFev  - sent message to our Rx team to see what they can do again abou tit  Chronic cough History of dust mite allergy 2018 Runny nose   -December 2023 allergy panel positive for dust mite, weeed and also trees - 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 in 4-6 weeks; 30 min visit  - symptoms score and walk test at followup  (Level 04: Estb 30-39 min  it spent in total care time and counseling or/and coordination of care by this undersigned MD - Dr Brand Males. This includes one or more of the following on this same day 09/05/2022: pre-charting, chart review, note writing, documentation discussion of test results, diagnostic or treatment recommendations, prognosis, risks and benefits of management options, instructions, education, compliance or risk-factor reduction. It excludes time spent by the [place] or office staff in the care of the patient . Actual time is 30 min)   SIGNATURE    Dr. Brand Males, M.D., F.C.C.P,  Pulmonary and Critical Care Medicine Staff Physician, [place] Director - Interstitial Lung Disease  Program  Pulmonary [place] at [place], [place], [address]  Pager: [phone], If no answer or between  15:00h - 7:00h: call [phone] Telephone: [phone]  9:12 AM 09/05/2022

## 2022-09-05 NOTE — Telephone Encounter (Signed)
Called patient and got him set up for a mychart video visit per MR this morning. Nothing further needed

## 2022-09-05 NOTE — Telephone Encounter (Signed)
This is the 2nd patient upset that my schedule is not booked beyond 09/25/22.  Please explain to him that due to changes in operational staffing . I believe it is now opened past 09/25/22. They did this yesterday.   I can do a video visit 09/05/2022 at 8.30am or 8.45AM -e morning slot is not filled 09/05/2022 ?

## 2022-09-05 NOTE — Telephone Encounter (Addendum)
Spoke with patient about Ofev vs Esbriet/pirfenidone. He states his VA benefit will not be active for another 8-10 months (best case scenario).  Reviewed copay of pirfenidone is $221.12 and copay of Ofev is ~$3200. I did advise that Ofev would be ideal treatment option but if he is hesitant and/or unwilling to pay this amount then [name] would be okay with him being on pirfenidone for any antifibrotic benefit.  Patient would like to know what copay would be the following month but I advised that I would never be able to quote a copay for any other day but today's due to the structuring of Medicare prescription benefit plans. I also reviewed changes to Medicare Part D's out-of-pocket rx for 2024 (about ~3500 with deductible) but also advised that this is assuming we are interpreting the communication correctly. Advised that most patient assistance programs have changed their income maximum to be lower because of Medicare's restructuring.  Patient would like to call his insurance and see if they can provide a better estimate of month to month cost for both Ofev and pirfenidone. Provided him with my direct number to call me back once he's reached a decision.If no f/u from patient, will call him next week at some point  Knox Saliva, PharmD, MPH, BCPS, CPP Clinical Pharmacist (Rheumatology and Pulmonology)

## 2022-09-06 ENCOUNTER — Telehealth: Payer: Self-pay | Admitting: Internal Medicine

## 2022-09-06 MED ORDER — PREDNISONE 10 MG PO TABS: ORAL_TABLET | ORAL | 0 refills | Status: AC

## 2022-09-06 NOTE — Telephone Encounter (Signed)
PT wife calling. States Dr. Was to have called in a Pred Script. I did not see it .but I do see this dialog from [place]. Not sure if it related so will send to both Triage and RX team.   Please call wife to advise @ [phone]      Called and spoke with patient. I advised I would send in the prednisone for him. He verbalized understanding.   Nothing further needed at time of call.

## 2022-09-06 NOTE — Telephone Encounter (Signed)
PT wife calling. States Dr. Was to have called in a Pred Script. I did not see it .but I do see this dialog from [place]. Not sure if it related so will send to both Triage and RX team.  Please call wife to advise @ [phone]

## 2022-09-06 NOTE — Telephone Encounter (Signed)
Patient is following up, again requesting to speak with Judson Roch. He states his call can be returned to [phone].

## 2022-09-10 ENCOUNTER — Inpatient Hospital Stay (HOSPITAL_COMMUNITY)
Admission: EM | Admit: 2022-09-10 | Discharge: 2022-09-14 | DRG: 196 | Disposition: A | Discharge status: Home / Self Care | Payer: Medicare HMO | Attending: Internal Medicine | Admitting: Internal Medicine

## 2022-09-10 ENCOUNTER — Inpatient Hospital Stay (HOSPITAL_COMMUNITY): Payer: Medicare HMO

## 2022-09-10 ENCOUNTER — Encounter (HOSPITAL_COMMUNITY): Payer: Self-pay

## 2022-09-10 ENCOUNTER — Other Ambulatory Visit: Payer: Self-pay

## 2022-09-10 ENCOUNTER — Emergency Department (HOSPITAL_COMMUNITY): Payer: Medicare HMO

## 2022-09-10 ENCOUNTER — Encounter: Payer: Self-pay | Admitting: Cardiovascular Disease

## 2022-09-10 DIAGNOSIS — D649 Anemia, unspecified: Secondary | ICD-10-CM

## 2022-09-10 DIAGNOSIS — Z825 Family history of asthma and other chronic lower respiratory diseases: Secondary | ICD-10-CM | POA: Diagnosis not present

## 2022-09-10 DIAGNOSIS — Z8261 Family history of arthritis: Secondary | ICD-10-CM | POA: Diagnosis not present

## 2022-09-10 DIAGNOSIS — E7800 Pure hypercholesterolemia, unspecified: Secondary | ICD-10-CM | POA: Diagnosis not present

## 2022-09-10 DIAGNOSIS — Z833 Family history of diabetes mellitus: Secondary | ICD-10-CM

## 2022-09-10 DIAGNOSIS — Z79899 Other long term (current) drug therapy: Secondary | ICD-10-CM

## 2022-09-10 DIAGNOSIS — J449 Chronic obstructive pulmonary disease, unspecified: Secondary | ICD-10-CM | POA: Diagnosis not present

## 2022-09-10 DIAGNOSIS — M1990 Unspecified osteoarthritis, unspecified site: Secondary | ICD-10-CM | POA: Diagnosis present

## 2022-09-10 DIAGNOSIS — A419 Sepsis, unspecified organism: Secondary | ICD-10-CM | POA: Diagnosis not present

## 2022-09-10 DIAGNOSIS — R0902 Hypoxemia: Secondary | ICD-10-CM | POA: Diagnosis not present

## 2022-09-10 DIAGNOSIS — A403 Sepsis due to Streptococcus pneumoniae: Secondary | ICD-10-CM | POA: Diagnosis not present

## 2022-09-10 DIAGNOSIS — I25119 Atherosclerotic heart disease of native coronary artery with unspecified angina pectoris: Secondary | ICD-10-CM | POA: Diagnosis not present

## 2022-09-10 DIAGNOSIS — I700 Atherosclerosis of aorta: Secondary | ICD-10-CM | POA: Diagnosis not present

## 2022-09-10 DIAGNOSIS — D471 Chronic myeloproliferative disease: Secondary | ICD-10-CM | POA: Diagnosis not present

## 2022-09-10 DIAGNOSIS — Z8546 Personal history of malignant neoplasm of prostate: Secondary | ICD-10-CM | POA: Diagnosis not present

## 2022-09-10 DIAGNOSIS — Z9981 Dependence on supplemental oxygen: Secondary | ICD-10-CM | POA: Diagnosis not present

## 2022-09-10 DIAGNOSIS — I2510 Atherosclerotic heart disease of native coronary artery without angina pectoris: Secondary | ICD-10-CM | POA: Diagnosis present

## 2022-09-10 DIAGNOSIS — Z8613 Personal history of malaria: Secondary | ICD-10-CM

## 2022-09-10 DIAGNOSIS — R0602 Shortness of breath: Secondary | ICD-10-CM | POA: Diagnosis not present

## 2022-09-10 DIAGNOSIS — J189 Pneumonia, unspecified organism: Principal | ICD-10-CM | POA: Diagnosis present

## 2022-09-10 DIAGNOSIS — K219 Gastro-esophageal reflux disease without esophagitis: Secondary | ICD-10-CM | POA: Diagnosis present

## 2022-09-10 DIAGNOSIS — R042 Hemoptysis: Secondary | ICD-10-CM | POA: Diagnosis present

## 2022-09-10 DIAGNOSIS — K449 Diaphragmatic hernia without obstruction or gangrene: Secondary | ICD-10-CM | POA: Diagnosis not present

## 2022-09-10 DIAGNOSIS — I959 Hypotension, unspecified: Secondary | ICD-10-CM | POA: Diagnosis not present

## 2022-09-10 DIAGNOSIS — Z808 Family history of malignant neoplasm of other organs or systems: Secondary | ICD-10-CM

## 2022-09-10 DIAGNOSIS — J84112 Idiopathic pulmonary fibrosis: Secondary | ICD-10-CM | POA: Diagnosis not present

## 2022-09-10 DIAGNOSIS — R918 Other nonspecific abnormal finding of lung field: Secondary | ICD-10-CM | POA: Diagnosis not present

## 2022-09-10 DIAGNOSIS — I5033 Acute on chronic diastolic (congestive) heart failure: Secondary | ICD-10-CM | POA: Diagnosis present

## 2022-09-10 DIAGNOSIS — E876 Hypokalemia: Secondary | ICD-10-CM

## 2022-09-10 DIAGNOSIS — I3139 Other pericardial effusion (noninflammatory): Secondary | ICD-10-CM | POA: Diagnosis not present

## 2022-09-10 DIAGNOSIS — R911 Solitary pulmonary nodule: Secondary | ICD-10-CM | POA: Diagnosis present

## 2022-09-10 DIAGNOSIS — Z885 Allergy status to narcotic agent status: Secondary | ICD-10-CM

## 2022-09-10 DIAGNOSIS — I110 Hypertensive heart disease with heart failure: Secondary | ICD-10-CM | POA: Diagnosis present

## 2022-09-10 DIAGNOSIS — J168 Pneumonia due to other specified infectious organisms: Secondary | ICD-10-CM | POA: Diagnosis not present

## 2022-09-10 DIAGNOSIS — J849 Interstitial pulmonary disease, unspecified: Secondary | ICD-10-CM | POA: Diagnosis not present

## 2022-09-10 DIAGNOSIS — R000 Tachycardia, unspecified: Secondary | ICD-10-CM | POA: Diagnosis not present

## 2022-09-10 DIAGNOSIS — Z811 Family history of alcohol abuse and dependence: Secondary | ICD-10-CM

## 2022-09-10 DIAGNOSIS — J9621 Acute and chronic respiratory failure with hypoxia: Secondary | ICD-10-CM

## 2022-09-10 DIAGNOSIS — I252 Old myocardial infarction: Secondary | ICD-10-CM | POA: Diagnosis not present

## 2022-09-10 DIAGNOSIS — J9691 Respiratory failure, unspecified with hypoxia: Secondary | ICD-10-CM | POA: Diagnosis not present

## 2022-09-10 DIAGNOSIS — F419 Anxiety disorder, unspecified: Secondary | ICD-10-CM | POA: Diagnosis present

## 2022-09-10 DIAGNOSIS — J8410 Pulmonary fibrosis, unspecified: Principal | ICD-10-CM | POA: Diagnosis present

## 2022-09-10 DIAGNOSIS — Z7951 Long term (current) use of inhaled steroids: Secondary | ICD-10-CM

## 2022-09-10 DIAGNOSIS — D469 Myelodysplastic syndrome, unspecified: Secondary | ICD-10-CM | POA: Diagnosis present

## 2022-09-10 DIAGNOSIS — Z1152 Encounter for screening for COVID-19: Secondary | ICD-10-CM

## 2022-09-10 DIAGNOSIS — Z888 Allergy status to other drugs, medicaments and biological substances status: Secondary | ICD-10-CM

## 2022-09-10 DIAGNOSIS — G4730 Sleep apnea, unspecified: Secondary | ICD-10-CM | POA: Diagnosis not present

## 2022-09-10 DIAGNOSIS — J984 Other disorders of lung: Secondary | ICD-10-CM | POA: Diagnosis not present

## 2022-09-10 DIAGNOSIS — Z955 Presence of coronary angioplasty implant and graft: Secondary | ICD-10-CM

## 2022-09-10 DIAGNOSIS — J9601 Acute respiratory failure with hypoxia: Secondary | ICD-10-CM | POA: Diagnosis not present

## 2022-09-10 DIAGNOSIS — I10 Essential (primary) hypertension: Secondary | ICD-10-CM | POA: Diagnosis not present

## 2022-09-10 DIAGNOSIS — Z87442 Personal history of urinary calculi: Secondary | ICD-10-CM

## 2022-09-10 DIAGNOSIS — Z86010 Personal history of colonic polyps: Secondary | ICD-10-CM

## 2022-09-10 DIAGNOSIS — D630 Anemia in neoplastic disease: Secondary | ICD-10-CM | POA: Diagnosis present

## 2022-09-10 DIAGNOSIS — Z9889 Other specified postprocedural states: Secondary | ICD-10-CM | POA: Diagnosis not present

## 2022-09-10 DIAGNOSIS — Z7982 Long term (current) use of aspirin: Secondary | ICD-10-CM

## 2022-09-10 DIAGNOSIS — Z7952 Long term (current) use of systemic steroids: Secondary | ICD-10-CM

## 2022-09-10 LAB — BASIC METABOLIC PANEL
Anion gap: 14 (ref 5–15)
BUN: 14 mg/dL (ref 8–23)
CO2: 27 mmol/L (ref 22–32)
Calcium: 9.3 mg/dL (ref 8.9–10.3)
Chloride: 100 mmol/L (ref 98–111)
Creatinine, Ser: 0.8 mg/dL (ref 0.61–1.24)
GFR, Estimated: >60 mL/min (ref >60)
Glucose, Bld: 132 mg/dL — ABNORMAL HIGH (ref 70–99)
Potassium: 3.4 mmol/L — ABNORMAL LOW (ref 3.5–5.1)
Sodium: 141 mmol/L (ref 135–145)

## 2022-09-10 LAB — I-STAT ARTERIAL BLOOD GAS, ED
Acid-Base Excess: 3 mmol/L — ABNORMAL HIGH (ref 0.0–2.0)
Bicarbonate: 28.2 mmol/L — ABNORMAL HIGH (ref 20.0–28.0)
Calcium, Ion: 1.21 mmol/L (ref 1.15–1.40)
HCT: 35 % — ABNORMAL LOW (ref 39.0–52.0)
Hemoglobin: 11.9 g/dL — ABNORMAL LOW (ref 13.0–17.0)
O2 Saturation: 91 %
Patient temperature: 98.6
Potassium: 3.3 mmol/L — ABNORMAL LOW (ref 3.5–5.1)
Sodium: 140 mmol/L (ref 135–145)
TCO2: 30 mmol/L (ref 22–32)
pCO2 arterial: 43.1 mmHg (ref 32–48)
pH, Arterial: 7.424 (ref 7.35–7.45)
pO2, Arterial: 59 mmHg — ABNORMAL LOW (ref 83–108)

## 2022-09-10 LAB — RESP PANEL BY RT-PCR (RSV, FLU A&B, COVID)  RVPGX2
Influenza A by PCR: NEGATIVE
Influenza B by PCR: NEGATIVE
Resp Syncytial Virus by PCR: NEGATIVE
SARS Coronavirus 2 by RT PCR: NEGATIVE

## 2022-09-10 LAB — CBC WITH DIFFERENTIAL/PLATELET
Abs Immature Granulocytes: 6.24 10*3/uL — ABNORMAL HIGH (ref 0.00–0.07)
Basophils Absolute: 0.3 10*3/uL — ABNORMAL HIGH (ref 0.0–0.1)
Basophils Relative: 1 %
Eosinophils Absolute: 0.1 10*3/uL (ref 0.0–0.5)
Eosinophils Relative: 0 %
HCT: 36.1 % — ABNORMAL LOW (ref 39.0–52.0)
Hemoglobin: 11 g/dL — ABNORMAL LOW (ref 13.0–17.0)
Immature Granulocytes: 18 %
Lymphocytes Relative: 5 %
Lymphs Abs: 1.7 10*3/uL (ref 0.7–4.0)
MCH: 30.1 pg (ref 26.0–34.0)
MCHC: 30.5 g/dL (ref 30.0–36.0)
MCV: 98.9 fL (ref 80.0–100.0)
Monocytes Absolute: 3.6 10*3/uL — ABNORMAL HIGH (ref 0.1–1.0)
Monocytes Relative: 10 %
Neutro Abs: 23.6 10*3/uL — ABNORMAL HIGH (ref 1.7–7.7)
Neutrophils Relative %: 66 %
Platelets: 340 10*3/uL (ref 150–400)
RBC: 3.65 MIL/uL — ABNORMAL LOW (ref 4.22–5.81)
RDW: 23.9 % — ABNORMAL HIGH (ref 11.5–15.5)
WBC: 35.6 10*3/uL — ABNORMAL HIGH (ref 4.0–10.5)
nRBC: 0.4 % — ABNORMAL HIGH (ref 0.0–0.2)

## 2022-09-10 LAB — LACTIC ACID, PLASMA
Lactic Acid, Venous: 2.8 mmol/L (ref 0.5–1.9)
Lactic Acid, Venous: 2.9 mmol/L (ref 0.5–1.9)

## 2022-09-10 LAB — BRAIN NATRIURETIC PEPTIDE: B Natriuretic Peptide: 272.4 pg/mL — ABNORMAL HIGH (ref 0.0–100.0)

## 2022-09-10 LAB — STREP PNEUMONIAE URINARY ANTIGEN: Strep Pneumo Urinary Antigen: NEGATIVE

## 2022-09-10 LAB — PROCALCITONIN: Procalcitonin: 0.12 ng/mL

## 2022-09-10 LAB — MRSA NEXT GEN BY PCR, NASAL: MRSA by PCR Next Gen: NOT DETECTED

## 2022-09-10 MED ORDER — ACETAMINOPHEN 650 MG RE SUPP: 650.0000 mg | Freq: Four times a day (QID) | RECTAL | Status: DC | PRN

## 2022-09-10 MED ORDER — ONDANSETRON HCL 4 MG PO TABS: 4.0000 mg | ORAL_TABLET | Freq: Four times a day (QID) | ORAL | Status: DC | PRN

## 2022-09-10 MED ORDER — METHYLPREDNISOLONE SODIUM SUCC 40 MG IJ SOLR
40.0000 mg | Freq: Two times a day (BID) | INTRAMUSCULAR | Status: DC
  Administered 2022-09-10: 40 mg via INTRAVENOUS
  Filled 2022-09-10: qty 1

## 2022-09-10 MED ORDER — VANCOMYCIN HCL 1500 MG/300ML IV SOLN
1500.0000 mg | Freq: Once | INTRAVENOUS | Status: AC
  Administered 2022-09-10: 1500 mg via INTRAVENOUS
  Filled 2022-09-10: qty 300

## 2022-09-10 MED ORDER — PANTOPRAZOLE SODIUM 40 MG PO TBEC
40.0000 mg | DELAYED_RELEASE_TABLET | Freq: Two times a day (BID) | ORAL | Status: DC
  Administered 2022-09-10 – 2022-09-14 (×9): 40 mg via ORAL
  Filled 2022-09-10 (×9): qty 1

## 2022-09-10 MED ORDER — SODIUM CHLORIDE 0.9 % IV SOLN
2.0000 g | Freq: Once | INTRAVENOUS | Status: AC
  Administered 2022-09-10: 2 g via INTRAVENOUS
  Filled 2022-09-10: qty 12.5

## 2022-09-10 MED ORDER — ENOXAPARIN SODIUM 40 MG/0.4ML IJ SOSY
40.0000 mg | PREFILLED_SYRINGE | INTRAMUSCULAR | Status: DC
  Administered 2022-09-10 – 2022-09-13 (×4): 40 mg via SUBCUTANEOUS
  Filled 2022-09-10 (×4): qty 0.4

## 2022-09-10 MED ORDER — GUAIFENESIN ER 600 MG PO TB12
600.0000 mg | ORAL_TABLET | Freq: Two times a day (BID) | ORAL | Status: DC
  Administered 2022-09-10 – 2022-09-14 (×9): 600 mg via ORAL
  Filled 2022-09-10 (×9): qty 1

## 2022-09-10 MED ORDER — SODIUM CHLORIDE 0.9 % IV BOLUS
500.0000 mL | Freq: Once | INTRAVENOUS | Status: AC
  Administered 2022-09-10: 500 mL via INTRAVENOUS

## 2022-09-10 MED ORDER — SODIUM CHLORIDE 0.9% FLUSH
3.0000 mL | Freq: Two times a day (BID) | INTRAVENOUS | Status: DC
  Administered 2022-09-10 – 2022-09-14 (×9): 3 mL via INTRAVENOUS

## 2022-09-10 MED ORDER — ALBUTEROL SULFATE (2.5 MG/3ML) 0.083% IN NEBU
2.5000 mg | INHALATION_SOLUTION | RESPIRATORY_TRACT | Status: DC | PRN
  Administered 2022-09-10: 2.5 mg via RESPIRATORY_TRACT
  Filled 2022-09-10: qty 3

## 2022-09-10 MED ORDER — POTASSIUM CHLORIDE CRYS ER 20 MEQ PO TBCR
20.0000 meq | EXTENDED_RELEASE_TABLET | ORAL | Status: AC
  Administered 2022-09-10: 20 meq via ORAL
  Filled 2022-09-10: qty 1

## 2022-09-10 MED ORDER — ACETAMINOPHEN 325 MG PO TABS
650.0000 mg | ORAL_TABLET | Freq: Four times a day (QID) | ORAL | Status: DC | PRN
  Administered 2022-09-12: 650 mg via ORAL
  Filled 2022-09-10 (×2): qty 2

## 2022-09-10 MED ORDER — ASPIRIN 81 MG PO TBEC
81.0000 mg | DELAYED_RELEASE_TABLET | Freq: Every day | ORAL | Status: DC
  Administered 2022-09-10 – 2022-09-14 (×5): 81 mg via ORAL
  Filled 2022-09-10 (×5): qty 1

## 2022-09-10 MED ORDER — HYDROXYUREA 500 MG PO CAPS
500.0000 mg | ORAL_CAPSULE | Freq: Every day | ORAL | Status: DC
  Administered 2022-09-10 – 2022-09-14 (×5): 500 mg via ORAL
  Filled 2022-09-10 (×5): qty 1

## 2022-09-10 MED ORDER — ARFORMOTEROL TARTRATE 15 MCG/2ML IN NEBU
15.0000 ug | INHALATION_SOLUTION | Freq: Two times a day (BID) | RESPIRATORY_TRACT | Status: DC
  Administered 2022-09-10 – 2022-09-14 (×8): 15 ug via RESPIRATORY_TRACT
  Filled 2022-09-10 (×8): qty 2

## 2022-09-10 MED ORDER — BUDESONIDE 0.5 MG/2ML IN SUSP
0.5000 mg | Freq: Two times a day (BID) | RESPIRATORY_TRACT | Status: DC
  Administered 2022-09-10 – 2022-09-14 (×8): 0.5 mg via RESPIRATORY_TRACT
  Filled 2022-09-10 (×8): qty 2

## 2022-09-10 MED ORDER — SODIUM CHLORIDE 0.9 % IV SOLN
500.0000 mg | Freq: Every day | INTRAVENOUS | Status: DC
  Administered 2022-09-10: 500 mg via INTRAVENOUS
  Filled 2022-09-10: qty 5

## 2022-09-10 MED ORDER — ALBUTEROL SULFATE (2.5 MG/3ML) 0.083% IN NEBU
15.0000 mg/h | INHALATION_SOLUTION | Freq: Once | RESPIRATORY_TRACT | Status: AC
  Administered 2022-09-10: 15 mg/h via RESPIRATORY_TRACT
  Filled 2022-09-10: qty 18

## 2022-09-10 MED ORDER — METHYLPREDNISOLONE SODIUM SUCC 40 MG IJ SOLR: 40.0000 mg | Freq: Two times a day (BID) | INTRAMUSCULAR | Status: DC

## 2022-09-10 MED ORDER — ONDANSETRON HCL 4 MG/2ML IJ SOLN
4.0000 mg | Freq: Four times a day (QID) | INTRAMUSCULAR | Status: DC | PRN
  Administered 2022-09-12: 4 mg via INTRAVENOUS
  Filled 2022-09-10: qty 2

## 2022-09-10 MED ORDER — SODIUM CHLORIDE 0.9 % IV SOLN
2.0000 g | Freq: Every day | INTRAVENOUS | Status: AC
  Administered 2022-09-10 – 2022-09-14 (×5): 2 g via INTRAVENOUS
  Filled 2022-09-10 (×5): qty 20

## 2022-09-10 MED ORDER — VANCOMYCIN HCL 1500 MG/300ML IV SOLN: 1500.0000 mg | INTRAVENOUS | Status: DC

## 2022-09-10 NOTE — Progress Notes (Signed)
Pharmacy Antibiotic Note  [name] is a 82 y.o. male admitted on 09/10/2022 with pneumonia.  Pharmacy has been consulted for Vancomycin dosing. WBC elevated, renal function ok.   Plan: Vancomycin 1500 mg IV q24h >>>Estimated AUC: 457 Cefepime 2g IV x 1 in the ED, f/u additional gram negative coverage Trend WBC, temp, renal function  F/U infectious work-up Drug levels as indicated   Height: '5\' 7"'$  (170.2 cm) Weight: 72.6 kg (160 lb) IBW/kg (Calculated) : 66.1  Temp (24hrs), Avg:99.5 F (37.5 C), Min:99.5 F (37.5 C), Max:99.5 F (37.5 C)  Recent Labs  Lab 09/10/22 0415 09/10/22 0522  WBC 35.6*  --   CREATININE 0.80  --   LATICACIDVEN  --  2.8*    Estimated Creatinine Clearance: 70 mL/min (by C-G formula based on SCr of 0.8 mg/dL).    Allergies  Allergen Reactions   Celecoxib Other (See Comments)    Pt states precipitated his heart attack.   Tramadol Other (See Comments)    Makes patient jumpy    Narda Bonds, PharmD, BCPS Clinical Pharmacist Phone: [phone]

## 2022-09-10 NOTE — ED Provider Notes (Signed)
[facility] Provider Note   CSN: [id number] Arrival date & time: 09/10/22  0409     History  Chief Complaint  Patient presents with   Shortness of Breath    [name] is a 82 y.o. male.  HPI     This is a 82 year old male who presents with shortness of breath.  Recent history of being diagnosed with pulmonary fibrosis.  He is currently on a steroid taper.  Over the last 24 hours has had increasing shortness of breath with cough.  He is on 2 L of nasal cannula at baseline at home.  Upon EMS arrival firefighter arrival, he was noted to be 17s.  This did not improve significantly with nasal cannula.  Ultimately he was placed on CPAP for hypoxia.  He was given 2 DuoNebs, 125 of Solu-Medrol, magnesium, and 0.3 mg of epinephrine with some improvement.  Patient denies any recent fevers.  No chest pain.  Reports some mild lower extremity swelling.  Chart reviewed.  Patient with significant history of pulmonary fibrosis, prostate cancer, myeloproliferative disorder.  Currently on a steroid taper.  Saw [name] on the 25th for worsening cough.  Home Medications Prior to Admission medications   Medication Sig Start Date End Date Taking? Authorizing Provider  acetaminophen (TYLENOL) 325 MG tablet Take 650 mg by mouth every 6 (six) hours as needed for mild pain or moderate pain.    [provider]  amLODipine (NORVASC) 2.5 MG tablet Take 1 tablet (2.5 mg total) by mouth daily. 08/23/22   [name], MD  Apoaequorin (PREVAGEN PO) Take 1 tablet by mouth daily.     [provider]  aspirin 81 MG EC tablet Take 81 mg by mouth daily.    [provider]  budesonide-formoterol (SYMBICORT) 80-4.5 MCG/ACT inhaler Inhale 2 puffs into the lungs 2 (two) times daily. 03/11/22   [name], MD  Cholecalciferol (VITAMIN D3) 1.25 MG (50000 UT) CAPS Take 1 capsule by mouth daily. Pt does not know dose.    [provider]  famotidine (PEPCID) 20 MG tablet Take 20 mg by mouth daily.    [provider]  hydroxyurea (HYDREA) 500 MG capsule Take 1 capsule (500 mg total) by mouth daily. May take with food to minimize GI side effects. 06/03/22   Curt Bears, MD  Multiple Vitamins-Minerals (ICAPS AREDS 2) CAPS Take by mouth daily.    [provider]  pantoprazole (PROTONIX) 40 MG tablet Take 1 tablet (40 mg total) by mouth 2 (two) times daily. 07/23/22 08/22/22  Shelly Coss, MD  predniSONE (DELTASONE) 10 MG tablet Take 4 tablets (40 mg total) by mouth daily with breakfast for 7 days, THEN 3 tablets (30 mg total) daily with breakfast for 7 days, THEN 2 tablets (20 mg total) daily with breakfast for 7 days. Then continue on '10mg'$  daily until further notice.. 09/06/22 10-24-2022  Brand Males, MD  simvastatin (ZOCOR) 20 MG tablet TAKE 1 TABLET EVERY DAY WITH BREAKFAST (APPOINTMENT IS NEEDED FOR REFILLS) 12/19/21   [name], MD  UNABLE TO FIND Med Name: CPAP per Dr Halford Chessman    [provider]      Allergies    Celecoxib and Tramadol    Review of Systems   Review of Systems  Constitutional:  Negative for fever.  Respiratory:  Positive for cough and shortness of breath.   Cardiovascular:  Positive for leg swelling. Negative for chest pain.  All  other systems reviewed and are negative.   Physical Exam Updated Vital Signs BP (!) 126/50   Pulse (!) 105   Temp 99.5 F (37.5 C) (Oral)   Resp (!) 32   Ht 1.702 m ('5\' 7"'$ )   Wt 72.6 kg   SpO2 90%   BMI 25.06 kg/m  Physical Exam Vitals and nursing note reviewed.  Constitutional:      Appearance: He is well-developed. He is ill-appearing. He is not toxic-appearing.  HENT:     Head: Normocephalic and atraumatic.     Mouth/Throat:     Mouth: Mucous membranes are moist.  Eyes:     Pupils: Pupils are equal, round, and reactive to light.  Cardiovascular:     Rate and Rhythm: Normal rate and regular rhythm.     Heart  sounds: Normal heart sounds. No murmur heard. Pulmonary:     Effort: Pulmonary effort is normal. No respiratory distress.     Breath sounds: Normal breath sounds. No wheezing.     Comments: Mildly tachypneic, speaking in short sentences, no accessory muscle use, expiratory wheeze noted Abdominal:     General: Bowel sounds are normal.     Palpations: Abdomen is soft.     Tenderness: There is no abdominal tenderness. There is no rebound.  Musculoskeletal:     Cervical back: Neck supple.     Right lower leg: Edema present.     Left lower leg: Edema present.     Comments: Trace bilateral lower extremity edema  Lymphadenopathy:     Cervical: No cervical adenopathy.  Skin:    General: Skin is warm and dry.  Neurological:     Mental Status: He is alert and oriented to person, place, and time.  Psychiatric:        Mood and Affect: Mood normal.     ED Results / Procedures / Treatments   Labs (all labs ordered are listed, but only abnormal results are displayed) Labs Reviewed  CBC WITH DIFFERENTIAL/PLATELET - Abnormal; Notable for the following components:      Result Value   WBC 35.6 (*)    RBC 3.65 (*)    Hemoglobin 11.0 (*)    HCT 36.1 (*)    RDW 23.9 (*)    nRBC 0.4 (*)    Neutro Abs 23.6 (*)    Monocytes Absolute 3.6 (*)    Basophils Absolute 0.3 (*)    Abs Immature Granulocytes 6.24 (*)    All other components within normal limits  BASIC METABOLIC PANEL - Abnormal; Notable for the following components:   Potassium 3.4 (*)    Glucose, Bld 132 (*)    All other components within normal limits  BRAIN NATRIURETIC PEPTIDE - Abnormal; Notable for the following components:   B Natriuretic Peptide 272.4 (*)    All other components within normal limits  LACTIC ACID, PLASMA - Abnormal; Notable for the following components:   Lactic Acid, Venous 2.8 (*)    All other components within normal limits  I-STAT ARTERIAL BLOOD GAS, ED - Abnormal; Notable for the following components:    pO2, Arterial 59 (*)    Bicarbonate 28.2 (*)    Acid-Base Excess 3.0 (*)    Potassium 3.3 (*)    HCT 35.0 (*)    Hemoglobin 11.9 (*)    All other components within normal limits  RESP PANEL BY RT-PCR (RSV, FLU A&B, COVID)  RVPGX2  CULTURE, BLOOD (ROUTINE X 2)  CULTURE, BLOOD (ROUTINE X 2)  BLOOD  GAS, ARTERIAL  LACTIC ACID, PLASMA    EKG EKG Interpretation  Date/Time:  Tuesday September 10 2022 05:50:41 EST Ventricular Rate:  105 PR Interval:  139 QRS Duration: 74 QT Interval:  299 QTC Calculation: 396 R Axis:   79 Text Interpretation: Sinus tachycardia Anteroseptal infarct, old Repol abnrm suggests ischemia, diffuse leads Confirmed by Thayer Jew [phone]) on 09/10/2022 5:52:18 AM  Radiology [name] Portable 1 View  Result Date: 09/10/2022 CLINICAL DATA:  Shortness of breath. EXAM: PORTABLE CHEST 1 VIEW COMPARISON:  07/20/2022. FINDINGS: The heart size and mediastinal contours are stable. There is atherosclerotic calcification of the aorta. Lung volumes are low. There is hazy airspace disease in the lungs bilaterally with consolidation at the left lung base. No definite effusion or pneumothorax. Evidence of prior rotator cuff surgery is noted on the left. No acute osseous abnormality. IMPRESSION: Hazy airspace disease in the lungs bilaterally with consolidation at the left lung base, concerning for pneumonia. Electronically Signed   By: Brett Fairy M.D.   On: 09/10/2022 04:34    Procedures .Critical Care  Performed by: Merryl Hacker, MD Authorized by: Merryl Hacker, MD   Critical care provider statement:    Critical care time (minutes):  45   Critical care was necessary to treat or prevent imminent or life-threatening deterioration of the following conditions:  Respiratory failure   Critical care was time spent personally by me on the following activities:  Development of treatment plan with patient or surrogate, discussions with consultants, evaluation of  patient's response to treatment, examination of patient, ordering and review of laboratory studies, ordering and review of radiographic studies, ordering and performing treatments and interventions, pulse oximetry, re-evaluation of patient's condition and review of old charts     Medications Ordered in ED Medications  ceFEPIme (MAXIPIME) 2 g in sodium chloride 0.9 % 100 mL IVPB (2 g Intravenous New Bag/Given 09/10/22 0600)  vancomycin (VANCOREADY) IVPB 1500 mg/300 mL (has no administration in time range)  albuterol (PROVENTIL) (2.5 MG/3ML) 0.083% nebulizer solution (15 mg/hr Nebulization Given 09/10/22 0424)    ED Course/ Medical Decision Making/ A&P Clinical Course as of 09/10/22 0604  Tue Sep 10, 2022  0414 Patient 80% on 6 L nasal cannula. [CH]  0602 Patient currently on a continuous DuoNeb.  O2 sats 92%.  He remains mildly tachypneic but no overt respiratory distress.  X-rays concerning for possible pneumonia.  Would be at risk for hospital-acquired given admission in December.  He was given vancomycin and cefepime.  Lactate 2.8.  May be related to work of breathing.  Viral testing negative.  Will need to be admitted for increasing oxygen requirement.  Likely multifactorial. [CH]    Clinical Course User Index [CH] Nyazia Canevari, Barbette Hair, MD                             Medical Decision Making Amount and/or Complexity of Data Reviewed Labs: ordered. Radiology: ordered.  Risk Prescription drug management. Decision regarding hospitalization.   This patient presents to the ED for concern of shortness of breath, hypoxia, this involves an extensive number of treatment options, and is a complaint that carries with it a high risk of complications and morbidity.  I considered the following differential and admission for this acute, potentially life threatening condition.  The differential diagnosis includes worsening interstitial lung disease, COPD, pneumonia, viral illness, pulmonary  edema  MDM:    This is a 82 year old male  with recent complicated respiratory history who presents with worsening cough and shortness of breath.  He was transitioned off EMS CPAP.  O2 sats initially 77% on 2 L.  He was increased to 6 L with minimal improvement into the mid 80s.  He does have some wheezing.  He is tachypneic but no longer tripoding.  Patient was started on continuous neb and O2 sats 92%.  Chest x-ray is concerning for possible pneumonia.  He has a significant leukocytosis but has myeloproliferative disorder and is on steroids.  Leukocytosis not reliable in predicting infectious etiology.  Given recent hospitalization, patient was covered with vancomycin and cefepime.  Lactate mildly elevated 2.8.  ABG correlates with pulse ox.  No significant metabolic abnormalities.  Respiratory panel negative.  EKG shows sinus tachycardia without ischemia.  On recheck, he is tolerating his neb well and satting well.  He clearly has an increased oxygen requirement.  This may be secondary to acute infection and/or worsening baseline lung function.  Will plan for admission to the hospital.  (Labs, imaging, consults)  Labs: I Ordered, and personally interpreted labs.  The pertinent results include: CBC, BMP, BNP  Imaging Studies ordered: I ordered imaging studies including chest x-ray I independently visualized and interpreted imaging. I agree with the radiologist interpretation  Additional history obtained from chart review.  External records from outside source obtained and reviewed including recent hospitalization and pulmonology notes  Cardiac Monitoring: The patient was maintained on a cardiac monitor.  I personally viewed and interpreted the cardiac monitored which showed an underlying rhythm of: Sinus tachycardia  Reevaluation: After the interventions noted above, I reevaluated the patient and found that they have :improved  Social Determinants of Health:  lives  independently  Disposition: Admit  Co morbidities that complicate the patient evaluation  Past Medical History:  Diagnosis Date   Abnormal chest x-ray    Allergic rhinitis    Ankylosing spondylitis ([place])    Anxiety    Arthritis    Colonic polyp    Coronary atherosclerosis of native coronary artery    Cough    Dizziness 10/21/2016   Ganglion cyst of wrist    left   Heart attack ([place]) 2008   Hemorrhoids    History of malaria    Hyperlipidemia    with low HDL   Hypertension    Kidney stone    Other disorder of muscle, ligament, and fascia    Rotator cuff tear    Swelling of lower extremity 10/16/2015     Medicines Meds ordered this encounter  Medications   albuterol (PROVENTIL) (2.5 MG/3ML) 0.083% nebulizer solution   ceFEPIme (MAXIPIME) 2 g in sodium chloride 0.9 % 100 mL IVPB    Order Specific Question:   Antibiotic Indication:    Answer:   HCAP   vancomycin (VANCOREADY) IVPB 1500 mg/300 mL    Order Specific Question:   Indication:    Answer:   Pneumonia    I have reviewed the patients home medicines and have made adjustments as needed  Problem List / ED Course: Problem List Items Addressed This Visit   None Visit Diagnoses     Pneumonia of both lungs due to infectious organism, unspecified part of lung    -  Primary   Relevant Medications   albuterol (PROVENTIL) (2.5 MG/3ML) 0.083% nebulizer solution (Completed)   ceFEPIme (MAXIPIME) 2 g in sodium chloride 0.9 % 100 mL IVPB   vancomycin (VANCOREADY) IVPB 1500 mg/300 mL   Acute on chronic respiratory  failure with hypoxia ([place])                       Final Clinical Impression(s) / ED Diagnoses Final diagnoses:  Pneumonia of both lungs due to infectious organism, unspecified part of lung  Acute on chronic respiratory failure with hypoxia [facility])    Rx / DC Orders ED Discharge Orders     None         Merryl Hacker, MD 09/10/22 [phone]

## 2022-09-10 NOTE — ED Triage Notes (Signed)
Pt BIB [facility] from home c/o [facility] that started around 10pm last night when fire arrived pt was in the 70's on his 2L [place]. Pt was placed on non-rebreather. EMS gave pt 2 duo nebs, 125 solumedrol, 2g of Magnesium and 0.3 of epi. EMS also placed pt on CPAP.

## 2022-09-10 NOTE — Consult Note (Signed)
NAME:  [name], MRN:  [id number], DOB:  May 09, 1941, LOS: 0 ADMISSION DATE:  09/10/2022, CONSULTATION DATE: 09/10/2022 REFERRING MD: [facility], CHIEF COMPLAINT: Acute respiratory failure   History of Present Illness:  [name] is a 82 year old male who carries a diagnosis of interstitial lung disease with an episode of December requiring oxygen and he was discharged home oxygen but she has not required the use of. He was seen creasingly short of breath overnight called EMS required oxygen therapy, steroids along with epinephrine and transported to the emergency department [facility].  She has been treated with epinephrine steroids antibiotics he is in no respiratory distress at the time of examination.  He is currently on 4 L nasal cannula with sats of 97% his respiratory rate is 24 his breathing is unlabored.  Agree with current interventions suspect the steroids and antibiotics helped the most.  Pertinent  Medical History   Past Medical History:  Diagnosis Date   Abnormal chest x-ray    Allergic rhinitis    Ankylosing spondylitis (HCC)    Anxiety    Arthritis    Colonic polyp    Coronary atherosclerosis of native coronary artery    Cough    Dizziness 10/21/2016   Ganglion cyst of wrist    left   Heart attack ([place]) 2008   Hemorrhoids    History of malaria    Hyperlipidemia    with low HDL   Hypertension    Kidney stone    Other disorder of muscle, ligament, and fascia    Rotator cuff tear    Swelling of lower extremity 10/16/2015     Significant Hospital Events: Including procedures, antibiotic start and stop dates in addition to other pertinent events     Interim History / Subjective:  Acute respiratory distress is resolved with systemic steroids and antimicrobial therapy.pmh   Objective   Blood pressure (!) 118/49, pulse 81, temperature 98.8 F (37.1 C), temperature source Oral, resp. rate (!) 26, height '5\' 7"'$  (1.702 m), weight 72.6 kg, SpO2 97 %.         Intake/Output Summary (Last 24 hours) at 09/10/2022 1135 Last data filed at 09/10/2022 4709 Gross per 24 hour  Intake 400 ml  Output --  Net 400 ml   Filed Weights   09/10/22 0416  Weight: 72.6 kg    Examination: General: Well-nourished well-developed male no acute distress at rest currently on 4 L nasal cannula HENT: No JVD or lymphadenopathy is appreciated Lungs: Decreased breath sounds in the bases Cardiovascular: Heart sounds regular regular rate rhythm Abdomen: Soft nontender positive bowel sounds Extremities: Mild lower extremity edema Neuro: Grossly intact without focal defect GU: [name] Hospital Problem list     Assessment & Plan:  Acute on chronic hypoxic respiratory failure in the setting of known interstitial lung disease, coronary artery disease with a similar episode December 2023. O2 was needed Titrate oxygen keep sats greater than 88% Agree with steroids Agree with empirical antimicrobial therapy in the setting of myeloproliferative disorder  Myeloproliferative disorder Followed by Dr. Julien Jordan  Interstitial lung disease Followed by Dr. Chase Jordan  Coronary artery disease Status post stent Per cardiology   Best Practice (right click and "Reselect all SmartList Selections" daily)   Diet/type: Regular consistency (see orders) DVT prophylaxis: LMWH GI prophylaxis: PPI Lines: N/A Foley:  N/A Code Status:  full code Last date of multidisciplinary goals of care discussion [tbd]  Labs   CBC: Recent Labs  Lab 09/10/22 0415  09/10/22 0439  WBC 35.6*  --   NEUTROABS 23.6*  --   HGB 11.0* 11.9*  HCT 36.1* 35.0*  MCV 98.9  --   PLT 340  --     Basic Metabolic Panel: Recent Labs  Lab 09/10/22 0415 09/10/22 0439  NA 141 140  K 3.4* 3.3*  CL 100  --   CO2 27  --   GLUCOSE 132*  --   BUN 14  --   CREATININE 0.80  --   CALCIUM 9.3  --    GFR: Estimated Creatinine Clearance: 70 mL/min (by C-G formula based on SCr of 0.8  mg/dL). Recent Labs  Lab 09/10/22 0415 09/10/22 0522 09/10/22 0840  PROCALCITON  --   --  0.12  WBC 35.6*  --   --   LATICACIDVEN  --  2.8* 2.9*    Liver Function Tests: No results for input(s): "AST", "ALT", "ALKPHOS", "BILITOT", "PROT", "ALBUMIN" in the last 168 hours. No results for input(s): "LIPASE", "AMYLASE" in the last 168 hours. No results for input(s): "AMMONIA" in the last 168 hours.  ABG    Component Value Date/Time   PHART 7.424 09/10/2022 0439   PCO2ART 43.1 09/10/2022 0439   PO2ART 59 (L) 09/10/2022 0439   HCO3 28.2 (H) 09/10/2022 0439   TCO2 30 09/10/2022 0439   O2SAT 91 09/10/2022 0439     Coagulation Profile: No results for input(s): "INR", "PROTIME" in the last 168 hours.  Cardiac Enzymes: No results for input(s): "CKTOTAL", "CKMB", "CKMBINDEX", "TROPONINI" in the last 168 hours.  HbA1C: Hgb A1c MFr Bld  Date/Time Value Ref Range Status  07/22/2022 04:32 AM 5.4 4.8 - 5.6 % Final    Comment:    (NOTE)         Prediabetes: 5.7 - 6.4         Diabetes: >6.4         Glycemic control for adults with diabetes: <7.0   02/07/2022 10:28 AM 5.7 4.6 - 6.5 % Final    Comment:    Glycemic Control Guidelines for People with Diabetes:Non Diabetic:  <6%Goal of Therapy: <7%Additional Action Suggested:  >8%     CBG: No results for input(s): "GLUCAP" in the last 168 hours.  Review of Systems:   10 point review of system taken, please see HPI for positives and negatives. Reported difficulty taking a deep breath Reported chills and route to the hospital He has normal home O2 at 2 L but has not been using   Past Medical History:  He,  has a past medical history of Abnormal chest x-ray, Allergic rhinitis, Ankylosing spondylitis ([place]), Anxiety, Arthritis, Colonic polyp, Coronary atherosclerosis of native coronary artery, Cough, Dizziness (10/21/2016), Ganglion cyst of wrist, Heart attack ([place]) (2008), Hemorrhoids, History of malaria, Hyperlipidemia, Hypertension,  Kidney stone, Other disorder of muscle, ligament, and fascia, Rotator cuff tear, and Swelling of lower extremity (10/16/2015).   Surgical History:   Past Surgical History:  Procedure Laterality Date   bicep rupture     CORONARY ANGIOPLASTY WITH STENT PLACEMENT     HEMORRHOID SURGERY  05/2009   in [facility]   LEFT HEART CATH AND CORONARY ANGIOGRAPHY N/A 01/14/2017   Procedure: Left Heart Cath and Coronary Angiography;  Surgeon: [name], MD;  Location: [place] CV LAB;  Service: Cardiovascular;  Laterality: N/A;   LEFT HEART CATHETERIZATION WITH CORONARY ANGIOGRAM N/A 09/03/2012   Procedure: LEFT HEART CATHETERIZATION WITH CORONARY ANGIOGRAM;  Surgeon: [name], MD;  Location: [facility] CATH LAB;  Service: Cardiovascular;  Laterality: N/A;   PROSTATE BIOPSY     ROTATOR CUFF REPAIR     bilat by Dr. French Ana     Social History:   reports that he has never smoked. He has never been exposed to tobacco smoke. He has never used smokeless tobacco. He reports current alcohol use. He reports that he does not use drugs.   Family History:  His family history includes Alcohol abuse in his father; Cancer in his mother; Diabetes in his mother; Emphysema in his mother; Rheum arthritis in his mother.   Allergies Allergies  Allergen Reactions   Celecoxib Other (See Comments)    Pt states precipitated his heart attack.   Tramadol Other (See Comments)    Makes patient jumpy     Home Medications  Prior to Admission medications   Medication Sig Start Date End Date Taking? Authorizing Provider  acetaminophen (TYLENOL) 325 MG tablet Take 650 mg by mouth every 6 (six) hours as needed for mild pain or moderate pain.    [provider]  amLODipine (NORVASC) 2.5 MG tablet Take 1 tablet (2.5 mg total) by mouth daily. 08/23/22   [name], MD  Apoaequorin (PREVAGEN PO) Take 1 tablet by mouth daily.     [provider]  aspirin 81 MG EC tablet Take 81 mg by mouth daily.     [provider]  budesonide-formoterol (SYMBICORT) 80-4.5 MCG/ACT inhaler Inhale 2 puffs into the lungs 2 (two) times daily. 03/11/22   [name], MD  Cholecalciferol (VITAMIN D3) 1.25 MG (50000 UT) CAPS Take 1 capsule by mouth daily. Pt does not know dose.    [provider]  famotidine (PEPCID) 20 MG tablet Take 20 mg by mouth daily.    [provider]  hydroxyurea (HYDREA) 500 MG capsule Take 1 capsule (500 mg total) by mouth daily. May take with food to minimize GI side effects. 06/03/22   Curt Bears, MD  Multiple Vitamins-Minerals (ICAPS AREDS 2) CAPS Take by mouth daily.    [provider]  pantoprazole (PROTONIX) 40 MG tablet Take 1 tablet (40 mg total) by mouth 2 (two) times daily. 07/23/22 08/22/22  Shelly Coss, MD  predniSONE (DELTASONE) 10 MG tablet Take 4 tablets (40 mg total) by mouth daily with breakfast for 7 days, THEN 3 tablets (30 mg total) daily with breakfast for 7 days, THEN 2 tablets (20 mg total) daily with breakfast for 7 days. Then continue on '10mg'$  daily until further notice.. 09/06/22 10-04-2022  Brand Males, MD  simvastatin (ZOCOR) 20 MG tablet TAKE 1 TABLET EVERY DAY WITH BREAKFAST (APPOINTMENT IS NEEDED FOR REFILLS) 12/19/21   [name], MD  UNABLE TO FIND Med Name: CPAP per Dr Halford Chessman    [provider]     Critical care time: n a    Richardson Landry Alverna Fawley ACNP Acute Care Nurse Practitioner [place] Please consult [place] 09/10/2022, 11:35 AM

## 2022-09-10 NOTE — Progress Notes (Signed)
Pt started on 15 mg CAT per MD

## 2022-09-10 NOTE — H&P (Signed)
History and Physical    Patient: [name] LDJ:[id number] DOB: 04-25-1941 DOA: 09/10/2022 DOS: the patient was seen and examined on 09/10/2022 PCP: [name], MD  Patient coming from: Home via EMS  Chief Complaint:  Chief Complaint  Patient presents with   Shortness of Breath   HPI: [name] is a 82 y.o. male with medical history significant of hypertension, hyperlipidemia, CAD, interstitial lung disease, chronic respiratory failure with hypoxia on 2 L as needed, and myeloproliferative disorder with JAK mutation followed by Dr. Earlie Server who presents with complaints of shortness of breath.  Patient had been hospitalized 07/20/2022-07/23/2022 with acute respiratory failure with hypoxia secondary to interstitial lung disease he had been placed on prophylactic Bactrim due to for concern of  PCP given patient being exposed to bird droppings.  Cultures from the hospitalization had only noted mixed flora.  He had been sent home on 2 L of oxygen, but had not been requiring oxygen prior to that.  He no longer has a bird at home.  He had only been using oxygen when getting up and exerting himself.  At home he reports that he had a intermittently productive cough with clear sputum.  Pain she reports having intermittent blood present in his sputum.  He had been seen by [name] pulmonology 5 days ago and had been started on a prolonged prednisone taper, but had not been on antibiotics since his prior hospitalization.  Last night patient reported having acute worsening of his breathing.  He reported being unable to take a deep inspiratory breath heart stopped and that led to him coughing.  His oxygenation was in the 60s and only got up to the 70s on 2 L of oxygen.  He was struggling to breathe admitted had tried using his Symbicort without any improvement in symptoms.  He had no other breathing treatments with which to use.  Patient notes associated symptoms of chills and leg swelling.   Denies any recent chest pain, nausea, vomiting, abdominal pain, diarrhea, or known recent sick contacts.  Upon the fire department arrival patient was noted to have O2 saturations in the 70s on home 2 L of nasal cannula oxygen and was placed on a nonrebreather.  In route with EMS patient had been given 2 DuoNeb breathing treatments, Solu-Medrol 125 mg IV, magnesium sulfate 2 g IV, epinephrine 0.3 mg, and switch to CPAP.  In the emergency department patient was noted to be tachycardic and tachypneic with blood pressures 117/48-160/52, and O2 saturations currently maintained on 6 L of nasal cannula oxygen.  Labs significant for WBC 35.6 with left shift, hemoglobin 11, potassium 3.4 and lactic acid elevated 2.8.  Chest x-ray noted hazy airspace disease in the lungs bilaterally with consolidation at the left lung base. ABG noted pH compensated at 7.424, pCO2 43.1, and pO2 59.  Influenza, COVID-19, and RSV screening were negative.  Blood cultures have been obtained.  Patient has been started on empiric antibiotics of vancomycin and cefepime and given a continuous albuterol breathing treatment.  Review of Systems: As mentioned in the history of present illness. All other systems reviewed and are negative. Past Medical History:  Diagnosis Date   Abnormal chest x-ray    Allergic rhinitis    Ankylosing spondylitis (HCC)    Anxiety    Arthritis    Colonic polyp    Coronary atherosclerosis of native coronary artery    Cough    Dizziness 10/21/2016   Ganglion cyst of wrist  left   Heart attack ([place]) 2008   Hemorrhoids    History of malaria    Hyperlipidemia    with low HDL   Hypertension    Kidney stone    Other disorder of muscle, ligament, and fascia    Rotator cuff tear    Swelling of lower extremity 10/16/2015   Past Surgical History:  Procedure Laterality Date   bicep rupture     CORONARY ANGIOPLASTY WITH STENT PLACEMENT     HEMORRHOID SURGERY  05/2009   in [place]   LEFT HEART CATH AND  CORONARY ANGIOGRAPHY N/A 01/14/2017   Procedure: Left Heart Cath and Coronary Angiography;  Surgeon: [name], MD;  Location: [employer] [facility] CV LAB;  Service: Cardiovascular;  Laterality: N/A;   LEFT HEART CATHETERIZATION WITH CORONARY ANGIOGRAM N/A 09/03/2012   Procedure: LEFT HEART CATHETERIZATION WITH CORONARY ANGIOGRAM;  Surgeon: [name], MD;  Location: [facility] CATH LAB;  Service: Cardiovascular;  Laterality: N/A;   PROSTATE BIOPSY     ROTATOR CUFF REPAIR     bilat by Dr. French Ana   Social History:  reports that he has never smoked. He has never been exposed to tobacco smoke. He has never used smokeless tobacco. He reports current alcohol use. He reports that he does not use drugs.  Allergies  Allergen Reactions   Celecoxib Other (See Comments)    Pt states precipitated his heart attack.   Tramadol Other (See Comments)    Makes patient jumpy    Family History  Problem Relation Age of Onset   Cancer Mother        melanoma   Emphysema Mother    Rheum arthritis Mother    Diabetes Mother    Alcohol abuse Father     Prior to Admission medications   Medication Sig Start Date End Date Taking? Authorizing Provider  acetaminophen (TYLENOL) 325 MG tablet Take 650 mg by mouth every 6 (six) hours as needed for mild pain or moderate pain.    [provider]  amLODipine (NORVASC) 2.5 MG tablet Take 1 tablet (2.5 mg total) by mouth daily. 08/23/22   [name], MD  Apoaequorin (PREVAGEN PO) Take 1 tablet by mouth daily.     [provider]  aspirin 81 MG EC tablet Take 81 mg by mouth daily.    [provider]  budesonide-formoterol (SYMBICORT) 80-4.5 MCG/ACT inhaler Inhale 2 puffs into the lungs 2 (two) times daily. 03/11/22   [name], MD  Cholecalciferol (VITAMIN D3) 1.25 MG (50000 UT) CAPS Take 1 capsule by mouth daily. Pt does not know dose.    [provider]  famotidine (PEPCID) 20 MG tablet Take 20 mg by mouth daily.    [provider]  hydroxyurea (HYDREA) 500 MG capsule Take 1 capsule (500 mg total) by mouth daily. May take with food to minimize GI side effects. 06/03/22   Curt Bears, MD  Multiple Vitamins-Minerals (ICAPS AREDS 2) CAPS Take by mouth daily.    [provider]  pantoprazole (PROTONIX) 40 MG tablet Take 1 tablet (40 mg total) by mouth 2 (two) times daily. 07/23/22 08/22/22  Shelly Coss, MD  predniSONE (DELTASONE) 10 MG tablet Take 4 tablets (40 mg total) by mouth daily with breakfast for 7 days, THEN 3 tablets (30 mg total) daily with breakfast for 7 days, THEN 2 tablets (20 mg total) daily with breakfast for 7 days. Then continue on '10mg'$  daily until further notice.. 09/06/22 2022/10/26  Brand Males, MD  simvastatin (ZOCOR) 20 MG tablet TAKE 1 TABLET EVERY DAY WITH BREAKFAST (APPOINTMENT IS NEEDED FOR REFILLS) 12/19/21   [name], MD  UNABLE TO FIND Med Name: CPAP per Dr Halford Chessman    [provider]    Physical Exam: Vitals:   09/10/22 0545 09/10/22 0615 09/10/22 0640 09/10/22 0645  BP: (!) 126/50 (!) 121/46  (!) 132/46  Pulse: (!) 105 (!) 107  (!) 110  Resp: (!) 32 (!) 35  (!) 28  Temp:      TempSrc:      SpO2: 90% 92% 93% 92%  Weight:      Height:        Constitutional: Elderly male who appears to be in no acute distress at this time Eyes: PERRL, lids and conjunctivae normal ENMT: Mucous membranes are moist.   Neck: normal, supple  Respiratory: Decreased overall aeration with inspiratory crackles appreciated.  Patient on 6 L of nasal cannula oxygen with O2 saturations maintained. Cardiovascular: Regular rate and rhythm, no murmurs / rubs / gallops.  Trace lower extremity edema.  2+ pedal pulses.  Abdomen: no tenderness, no masses palpated.   Bowel sounds positive.  Musculoskeletal: no clubbing / cyanosis. No joint deformity upper and lower extremities. Good ROM, no contractures. Normal muscle tone.  Skin: no rashes, lesions, ulcers. No  induration Neurologic: CN 2-12 grossly intact. Sensation intact, DTR normal. Strength 5/5 in all 4.  Psychiatric: Normal judgment and insight. Alert and oriented x 3. Normal mood.   Data Reviewed:  EKG reveals sinus tachycardia 105 bpm with repolarization abnormality.  Assessment and Plan:  Acute on chronic respiratory failure with hypoxia Sepsis secondary to pneumonia Patient presented with complaints of shortness of breath.  Normally on 2 L of oxygen with O2 saturations reported to be in the 70s on EMS arrival.  ABG normal pH with pCO2 59.  Patient was noted to be tachycardic and tachypneic with white blood cell count elevated at 35.6 meeting SIRS criteria.  Chest x-ray noting bilateral bibasilar opacities with left lower lobe consolidation concerning for pneumonia.  Lactic acid was elevated at 2.8.  Patient has been started on empiric antibiotics of cefepime and vancomycin given recent hospitalization.  Working diagnosis is pneumonia +/- worsening interstitial lung disease. -Admit to a progressive bed -Continuous pulse oximetry with nasal cannula oxygen to maintain O2 saturations greater than 90% -Consents for monitoring and flutter valve -Follow-up blood and sputum cultures -Follow-up MRSA screen (negative) -Check urine Legionella and urine strep -Check procalcitonin -De-escalated antibiotics to Rocephin and azithromycin   -Mucinex  Interstitial lung disease High-resolution CT from 07/21/22 and noted extensive, acutely superimposed groundglass and heterogeneous airspace disease particularly in the left lung base consistent with multifocal infection or aspiration with underlying pulmonary fibrosis.  He had been recently started on a steroid taper by his pulmonologist [name]. -Solu-Medrol IV twice daily -Brovana and budesonide nebs substituted for Symbicort -Albuterol nebs as needed -Neurology consulted, we will follow-up for any further recommendations  Hypokalemia Acute.   Potassium 3.4.  Could be related to breathing treatments patient received prior to arrival. -Potassium chloride 20 mEq x 1 dose -Continue to monitor and replace as needed  Myeloproliferative disorder Patient has Jak mutation and is followed in outpatient setting by Dr. Earlie Server hematology oncology.  Current medication regimen includes hydroxyurea.  -Continue hydroxyurea -Continue outpatient follow-up with Dr. Earlie Server  Normocytic anemia Hemoglobin 11 which appears near patient's baseline.  Patient does report intermittent hemoptysis -Continue to monitor  Essential hypertension Blood pressure  currently 118/49. -Held home medications of amlodipine due to soft blood pressure.  Resume when medically appropriate  CAD -Continue aspirin and statin  Hyperlipidemia -Continue atorvastatin  GERD  -Continue Protonix  DVT prophylaxis: Lovenox Advance Care Planning:   Code Status: Full Code   Consults: Pulmonology  Family Communication: None requested  Severity of Illness: The appropriate patient status for this patient is INPATIENT. Inpatient status is judged to be reasonable and necessary in order to provide the required intensity of service to ensure the patient's safety. The patient's presenting symptoms, physical exam findings, and initial radiographic and laboratory data in the context of their chronic comorbidities is felt to place them at high risk for further clinical deterioration. Furthermore, it is not anticipated that the patient will be medically stable for discharge from the hospital within 2 midnights of admission.   * I certify that at the point of admission it is my clinical judgment that the patient will require inpatient hospital care spanning beyond 2 midnights from the point of admission due to high intensity of service, high risk for further deterioration and high frequency of surveillance required.*  Author: Norval Morton, MD 09/10/2022 7:16 AM  For on call review  [email or web address].

## 2022-09-10 NOTE — Progress Notes (Signed)
CAT completed and removed from pt. No distress noted, pt tolerating humidified oxygen at 6L with oxygen saturations of 93%.

## 2022-09-11 ENCOUNTER — Inpatient Hospital Stay (HOSPITAL_COMMUNITY): Payer: Medicare HMO

## 2022-09-11 ENCOUNTER — Telehealth: Payer: Self-pay | Admitting: Primary Care

## 2022-09-11 DIAGNOSIS — J9621 Acute and chronic respiratory failure with hypoxia: Secondary | ICD-10-CM | POA: Diagnosis not present

## 2022-09-11 LAB — CBC WITH DIFFERENTIAL/PLATELET
Abs Immature Granulocytes: 0 10*3/uL (ref 0.00–0.07)
Basophils Absolute: 0 10*3/uL (ref 0.0–0.1)
Basophils Relative: 0 %
Eosinophils Absolute: 0 10*3/uL (ref 0.0–0.5)
Eosinophils Relative: 0 %
HCT: 33.3 % — ABNORMAL LOW (ref 39.0–52.0)
Hemoglobin: 10.1 g/dL — ABNORMAL LOW (ref 13.0–17.0)
Lymphocytes Relative: 1 %
Lymphs Abs: 0.3 10*3/uL — ABNORMAL LOW (ref 0.7–4.0)
MCH: 29.4 pg (ref 26.0–34.0)
MCHC: 30.3 g/dL (ref 30.0–36.0)
MCV: 97.1 fL (ref 80.0–100.0)
Monocytes Absolute: 2.8 10*3/uL — ABNORMAL HIGH (ref 0.1–1.0)
Monocytes Relative: 8 %
Neutro Abs: 31.6 10*3/uL — ABNORMAL HIGH (ref 1.7–7.7)
Neutrophils Relative %: 91 %
Platelets: 305 10*3/uL (ref 150–400)
RBC: 3.43 MIL/uL — ABNORMAL LOW (ref 4.22–5.81)
RDW: 24.2 % — ABNORMAL HIGH (ref 11.5–15.5)
WBC: 34.7 10*3/uL — ABNORMAL HIGH (ref 4.0–10.5)
nRBC: 0 /100 WBC
nRBC: 0.3 % — ABNORMAL HIGH (ref 0.0–0.2)

## 2022-09-11 LAB — EXPECTORATED SPUTUM ASSESSMENT W GRAM STAIN, RFLX TO RESP C

## 2022-09-11 LAB — BASIC METABOLIC PANEL
Anion gap: 11 (ref 5–15)
BUN: 18 mg/dL (ref 8–23)
CO2: 27 mmol/L (ref 22–32)
Calcium: 9.6 mg/dL (ref 8.9–10.3)
Chloride: 100 mmol/L (ref 98–111)
Creatinine, Ser: 0.73 mg/dL (ref 0.61–1.24)
GFR, Estimated: >60 mL/min (ref >60)
Glucose, Bld: 136 mg/dL — ABNORMAL HIGH (ref 70–99)
Potassium: 4.3 mmol/L (ref 3.5–5.1)
Sodium: 138 mmol/L (ref 135–145)

## 2022-09-11 LAB — MRSA NEXT GEN BY PCR, NASAL: MRSA by PCR Next Gen: NOT DETECTED

## 2022-09-11 LAB — C-REACTIVE PROTEIN: CRP: 1.5 mg/dL — ABNORMAL HIGH (ref <1.0)

## 2022-09-11 MED ORDER — METHYLPREDNISOLONE SODIUM SUCC 40 MG IJ SOLR
40.0000 mg | Freq: Every day | INTRAMUSCULAR | Status: DC
  Administered 2022-09-11 – 2022-09-13 (×3): 40 mg via INTRAVENOUS
  Filled 2022-09-11 (×2): qty 1

## 2022-09-11 MED ORDER — PROSIGHT PO TABS
1.0000 | ORAL_TABLET | Freq: Every day | ORAL | Status: DC
  Administered 2022-09-11 – 2022-09-14 (×4): 1 via ORAL
  Filled 2022-09-11 (×4): qty 1

## 2022-09-11 MED ORDER — SIMVASTATIN 20 MG PO TABS
20.0000 mg | ORAL_TABLET | Freq: Every day | ORAL | Status: DC
  Administered 2022-09-11 – 2022-09-14 (×4): 20 mg via ORAL
  Filled 2022-09-11 (×4): qty 1

## 2022-09-11 MED ORDER — VITAMIN D 25 MCG (1000 UNIT) PO TABS
2000.0000 [IU] | ORAL_TABLET | Freq: Every day | ORAL | Status: DC
  Administered 2022-09-11 – 2022-09-14 (×4): 2000 [IU] via ORAL
  Filled 2022-09-11 (×4): qty 2

## 2022-09-11 MED ORDER — AZITHROMYCIN 250 MG PO TABS
500.0000 mg | ORAL_TABLET | Freq: Every day | ORAL | Status: DC
  Administered 2022-09-11 – 2022-09-14 (×4): 500 mg via ORAL
  Filled 2022-09-11 (×4): qty 2

## 2022-09-11 MED ORDER — FUROSEMIDE 10 MG/ML IJ SOLN
60.0000 mg | Freq: Once | INTRAMUSCULAR | Status: AC
  Administered 2022-09-11: 60 mg via INTRAVENOUS
  Filled 2022-09-11: qty 6

## 2022-09-11 NOTE — Evaluation (Signed)
Occupational Therapy Evaluation Patient Details Name: [name] MRN: [id number] DOB: 11-07-1940 Today's Date: 09/11/2022   History of Present Illness Pt is a 82 y/o M presenting to ED on 1/30 with SOB, admitted for acute on chronic respiratory failure with hypoxia. PMH includes HTN, HLD, CAD, interstitial lung disease, chronic respiratory failure on 2L O2, myeloproliferative disorder with JAK mutation.   Clinical Impression   Pt reports independence at baseline with ADLs and functional mobility, although reports LB dressing is difficulty. Pt lives with spouse in multilevel home who can assist at d/c. Pt currently mod I - supervision for ADLs and transfers without AD. SpO2 down to 84% on 7L O2, recovering into low 90's after a few mins with cues for breathing techniques. Pt presenting with impairments listed below, will follow acutely. Recommend HHOT at d/c.     Recommendations for follow up therapy are one component of a multi-disciplinary discharge planning process, led by the attending physician.  Recommendations may be updated based on patient status, additional functional criteria and insurance authorization.   Follow Up Recommendations  Home health OT     Assistance Recommended at Discharge Set up Supervision/Assistance  Patient can return home with the following A little help with walking and/or transfers;A lot of help with bathing/dressing/bathroom;Assistance with cooking/housework;Direct supervision/assist for financial management;Direct supervision/assist for medications management;Assist for transportation;Help with stairs or ramp for entrance    Functional Status Assessment  Patient has had a recent decline in their functional status and demonstrates the ability to make significant improvements in function in a reasonable and predictable amount of time.  Equipment Recommendations  Tub/shower seat    Recommendations for Other Services PT consult     Precautions /  Restrictions Precautions Precaution Comments: watch O2 Restrictions Weight Bearing Restrictions: No      Mobility Bed Mobility               General bed mobility comments: in bathroom upon arrival    Transfers Overall transfer level: Needs assistance Equipment used: None Transfers: Sit to/from Stand Sit to Stand: Supervision                  Balance Overall balance assessment: No apparent balance deficits (not formally assessed)                                         ADL either performed or assessed with clinical judgement   ADL Overall ADL's : Needs assistance/impaired Eating/Feeding: Supervision/ safety   Grooming: Supervision/safety   Upper Body Bathing: Minimal assistance   Lower Body Bathing: Moderate assistance   Upper Body Dressing : Minimal assistance   Lower Body Dressing: Moderate assistance   Toilet Transfer: Supervision/safety;Ambulation;Regular Museum/gallery exhibitions officer and Hygiene: Supervision/safety       Functional mobility during ADLs: Supervision/safety       Vision Baseline Vision/History: 1 Wears glasses Vision Assessment?: No apparent visual deficits     Perception Perception Perception Tested?: No   Praxis Praxis Praxis tested?: Not tested    Pertinent Vitals/Pain Pain Assessment Pain Assessment: Faces Pain Score: 2  Faces Pain Scale: Hurts a little bit Pain Location: abdomen from Lasix     Hand Dominance Right   Extremity/Trunk Assessment Upper Extremity Assessment Upper Extremity Assessment: Overall WFL for tasks assessed   Lower Extremity Assessment Lower Extremity Assessment: Defer to PT evaluation   Cervical /  Trunk Assessment Cervical / Trunk Assessment: Normal   Communication Communication Communication: No difficulties   Cognition Arousal/Alertness: Awake/alert Behavior During Therapy: WFL for tasks assessed/performed Overall Cognitive Status: Within  Functional Limits for tasks assessed                                       General Comments  Spo2 down to 84% on 7L after toilet transfer, increasing to low 90's within ~57mns    Exercises     Shoulder Instructions      Home Living Family/patient expects to be discharged to:: Private residence Living Arrangements: Spouse/significant other Available Help at Discharge: Family;Available PRN/intermittently Type of Home: House Home Access: Stairs to enter [employer] Steps: 1   Home Layout: Multi-level Alternate Level Stairs-Number of Steps: 3 flights of stairs   Bathroom Shower/Tub: Tub/shower unit         Home Equipment: None          Prior Functioning/Environment Prior Level of Function : Independent/Modified Independent             Mobility Comments: no AD ADLs Comments: reports difficulty with LB dressing, says he is able to "get it done" with increased time        OT Problem List: Decreased strength;Decreased range of motion;Decreased activity tolerance;Impaired balance (sitting and/or standing);Decreased safety awareness      OT Treatment/Interventions: Self-care/ADL training;Therapeutic exercise;Energy conservation;DME and/or AE instruction;Therapeutic activities;Patient/family education;Balance training    OT Goals(Current goals can be found in the care plan section) Acute Rehab OT Goals Patient Stated Goal: none stated OT Goal Formulation: With patient Time For Goal Achievement: 09/25/22 Potential to Achieve Goals: Good ADL Goals Pt Will Perform Lower Body Dressing: with modified independence;sit to/from stand;sitting/lateral leans;with adaptive equipment Pt Will Transfer to Toilet: with modified independence;ambulating;regular height toilet Pt Will Perform Tub/Shower Transfer: Tub transfer;Shower transfer;with modified independence;ambulating Additional ADL Goal #1: pt will verbalize 3 energy conservation strategies in  prep for ADLs  OT Frequency: Min 2X/week    Co-evaluation              AM-PAC OT "6 Clicks" Daily Activity     Outcome Measure Help from another person eating meals?: None Help from another person taking care of personal grooming?: None Help from another person toileting, which includes using toliet, bedpan, or urinal?: A Little Help from another person bathing (including washing, rinsing, drying)?: A Lot Help from another person to put on and taking off regular upper body clothing?: None Help from another person to put on and taking off regular lower body clothing?: A Lot 6 Click Score: 19   End of Session Equipment Utilized During Treatment: Oxygen (7L) Nurse Communication: Mobility status  Activity Tolerance: Patient tolerated treatment well Patient left: in chair;with call bell/phone within reach (no chair alarm on as pt frequently standing to use urinal)  OT Visit Diagnosis: Unsteadiness on feet (R26.81);Other abnormalities of gait and mobility (R26.89)                Time: 05[address]-4403OT Time Calculation (min): 31 min Charges:  OT General Charges $OT Visit: 1 Visit OT Evaluation $OT Eval Moderate Complexity: 1 Mod OT Treatments $Self Care/Home Management : 8-22 mins  NRenaye Rakers OTD, OTR/L SecureChat Preferred Acute Rehab [phone]  NRenaye RakersKoonce 09/11/2022, 9:50 AM

## 2022-09-11 NOTE — Progress Notes (Signed)
PROGRESS NOTE                                                                                                                                                                                                             Patient Demographics:    [name], is a 82 y.o. male, DOB - 06/23/1941, BTY:[id number]  Outpatient Primary MD for the patient is [name], MD    LOS - 1  Admit date - 09/10/2022    Chief Complaint  Patient presents with   Shortness of Breath       Brief Narrative (HPI from H&P)   82 y.o. male with medical history significant of hypertension, hyperlipidemia, CAD, interstitial lung disease, chronic respiratory failure with hypoxia on 2 L as needed, and myeloproliferative disorder with JAK mutation followed by Dr. Earlie Server who presents with complaints of shortness of breath.  Patient had been hospitalized 07/20/2022-07/23/2022 with acute respiratory failure with hypoxia secondary to interstitial lung disease he had been placed on prophylactic Bactrim due to for concern of  PCP given patient being exposed to bird droppings, stabilized and discharged home on 2 L of oxygen.  He has since gotten rid of his birds, subsequently started on a prednisone taper however he continued to develop progressively worse shortness of breath and was admitted to the hospital.   Subjective:    [name] today has, No headache, No chest pain, No abdominal pain - No Nausea, No new weakness tingling or numbness, he has significant shortness of breath upon laying flat improves upon sitting up, has developed swelling in his lower extremities over the last week, also has exertional shortness of breath.   Assessment  & Plan :   Acute on chronic hypoxic respiratory failure.  Recent diagnosis of possible ILD and now diagnosed with CAP.  He had recent hospitalization with similar problems, pulmonary critical care is following the  patient, he is currently on empiric IV antibiotics to cover for CAP however he does give significant history of weight gain, lower extremity edema and significant orthopnea.  Will give him a trial of IV Lasix, encouraged to sit in chair use I-S and flutter valve in daytime, continue oxygen supplementation and advance activity.  Appreciate pulmonary follow-up.  Acute on chronic diastolic CHF.  EF 65% on recent echocardiogram done few weeks ago.  Lasix  and monitor.    Recent diagnosis of ILD.  Continue home nebulizer treatments, steroids IV here, defer further workup and management to pulmonary.  CT noted.  Right upper lobe lung nodule.  Noted on CT scan.  Follow-up with primary pulmonologist [name] postdischarge.  CAD.  On aspirin and statin for secondary prevention.  No acute issues.  Recent echo noted and stable with preserved EF and chronic diastolic CHF.  Essential hypertension.  Blood pressure currently soft holding home dose Norvasc.  Dyslipidemia.  On statin.  Hypokalemia.  Replaced.  History of myeloproliferative disorder with chronic leukocytosis.  Has Jak mutation and follows with Dr. [name] hematology oncology, continue hydroxyurea while in the hospital there after follow-up with his primary oncologist.  Chronic normocytic anemia.  Due to above.  Monitor.       Condition - Fair  Family Communication  :  None  Code Status :  Full  Consults  :  PCCM  PUD Prophylaxis : PPI   Procedures  :     CT - 1. Fibrotic interstitial lung disease in a non UIP pattern with increased bilateral ground-glass opacities and new subtle centrilobular ground-glass nodules of the right upper lobe. Findings are likely due to superimposed infection, pulmonary edema, or acute exacerbation of ILD. Findings are suggestive of an alternative diagnosis (not UIP) per consensus guidelines: Diagnosis of Idiopathic Pulmonary Fibrosis: An Official ATS/ERS/JRS/ALAT Clinical Practice Guideline. [place], Iss 5, [id number], Apr 12 2017. 2. Severe left main and three-vessel coronary artery calcifications. 3. Aortic Atherosclerosis (ICD10-I70.0).      Disposition Plan  :    Status is: Inpatient   DVT Prophylaxis  :    Place TED hose Start: 09/11/22 0746 enoxaparin (LOVENOX) injection 40 mg Start: 09/10/22 2200    Lab Results  Component Value Date   PLT 305 09/11/2022    Diet :  Diet Order             Diet Heart Room service appropriate? Yes; Fluid consistency: Thin  Diet effective now                    Inpatient Medications  Scheduled Meds:  arformoterol  15 mcg Nebulization BID   aspirin EC  81 mg Oral Daily   azithromycin  500 mg Oral Daily   budesonide (PULMICORT) nebulizer solution  0.5 mg Nebulization BID   cholecalciferol  2,000 Units Oral Daily   enoxaparin (LOVENOX) injection  40 mg Subcutaneous Q24H   guaiFENesin  600 mg Oral BID   hydroxyurea  500 mg Oral Daily   methylPREDNISolone (SOLU-MEDROL) injection  40 mg Intravenous Daily   multivitamin  1 tablet Oral Daily   pantoprazole  40 mg Oral BID   simvastatin  20 mg Oral Q breakfast   sodium chloride flush  3 mL Intravenous Q12H   Continuous Infusions:  cefTRIAXone (ROCEPHIN)  IV 2 g (09/11/22 0849)   PRN Meds:.acetaminophen **OR** acetaminophen, albuterol, ondansetron **OR** ondansetron (ZOFRAN) IV    Objective:   Vitals:   09/11/22 0101 09/11/22 0129 09/11/22 0359 09/11/22 0819  BP:  (!) 145/59 (!) 140/60 (!) 139/58  Pulse:  79 90 89  Resp:   20 18  Temp: 98.1 F (36.7 C) 98.5 F (36.9 C) 98 F (36.7 C) 97.7 F (36.5 C)  TempSrc: Oral Oral Oral Oral  SpO2:  93% 90% 90%  Weight:  71.8 kg    Height:  '5\' 7"'$  (1.702  m)      Wt Readings from Last 3 Encounters:  09/11/22 71.8 kg  08/27/22 73.3 kg  08/23/22 73.3 kg     Intake/Output Summary (Last 24 hours) at 09/11/2022 0945 Last data filed at 09/11/2022 0900 Gross per 24 hour  Intake 819.27 ml  Output  1700 ml  Net -880.73 ml     Physical Exam  Awake Alert, No new F.N deficits, Normal affect [place].AT,PERRAL Supple Neck, No JVD,   Symmetrical Chest wall movement, Good air movement bilaterally, +ve rales RRR,No Gallops,Rubs or new Murmurs,  +ve B.Sounds, Abd Soft, No tenderness,   1+ leg edema   Data Review:    Recent Labs  Lab 09/10/22 0415 09/10/22 0439 09/11/22 0137  WBC 35.6*  --  34.7*  HGB 11.0* 11.9* 10.1*  HCT 36.1* 35.0* 33.3*  PLT 340  --  305  MCV 98.9  --  97.1  MCH 30.1  --  29.4  MCHC 30.5  --  30.3  RDW 23.9*  --  24.2*  LYMPHSABS 1.7  --  0.3*  MONOABS 3.6*  --  2.8*  EOSABS 0.1  --  0.0  BASOSABS 0.3*  --  0.0    Recent Labs  Lab 09/10/22 0414 09/10/22 0415 09/10/22 0439 09/10/22 0522 09/10/22 0840 09/11/22 0137  NA  --  141 140  --   --  138  K  --  3.4* 3.3*  --   --  4.3  CL  --  100  --   --   --  100  CO2  --  27  --   --   --  27  ANIONGAP  --  14  --   --   --  11  GLUCOSE  --  132*  --   --   --  136*  BUN  --  14  --   --   --  18  CREATININE  --  0.80  --   --   --  0.73  CRP  --  1.5*  --   --   --   --   PROCALCITON  --   --   --   --  0.12  --   LATICACIDVEN  --   --   --  2.8* 2.9*  --   BNP 272.4*  --   --   --   --   --   CALCIUM  --  9.3  --   --   --  9.6    Radiology Reports [name] Port 1 View  Result Date: 09/11/2022 CLINICAL DATA:  Shortness of breath. EXAM: PORTABLE CHEST 1 VIEW COMPARISON:  09/10/2022 FINDINGS: 0649 hours. Low volume film with stable asymmetric elevation right hemidiaphragm. The cardio pericardial silhouette is enlarged. Volume loss again noted left hemithorax. There is diffuse interstitial and patchy airspace disease in both lungs, most pronounced at the left base. The visualized bony structures of the thorax are unremarkable. Telemetry leads overlie the chest. IMPRESSION: Low volume film with diffuse interstitial and patchy airspace disease in both lungs, most pronounced at the left base. Imaging  features compatible with interstitial lung disease/fibrosis as characterized on yesterday's high-resolution chest CT. Electronically Signed   By: Misty Stanley M.D.   On: 09/11/2022 06:55   CT Chest High Resolution  Result Date: 09/10/2022 CLINICAL DATA:  Interstitial lung disease EXAM: CT CHEST WITHOUT CONTRAST TECHNIQUE: Multidetector CT imaging of the chest was performed following the standard protocol  without intravenous contrast. High resolution imaging of the lungs, as well as inspiratory and expiratory imaging, was performed. RADIATION DOSE REDUCTION: This exam was performed according to the departmental dose-optimization program which includes automated exposure control, adjustment of the mA and/or kV according to patient size and/or use of iterative reconstruction technique. COMPARISON:  Multiple priors, most recent CT dated July 20, 2022 FINDINGS: Cardiovascular: Normal heart size. Pericardial effusion. Normal caliber thoracic aorta with moderate calcified plaque. Severe left main and three-vessel coronary artery calcifications. Mediastinum/Nodes: Small hiatal hernia. Thyroid is unremarkable. Mildly enlarged mediastinal and hilar lymph nodes, similar to prior exam and likely reactive. Lungs/Pleura: Central airways are patent. Evidence of mild-to-moderate air trapping. Elevation of the right hemidiaphragm. Peribronchovascular mid and lower lung predominant reticular opacities with associated traction bronchiectasis, difficult to assess for progressive fibrotic changes given superimposed acute ground-glass opacities. No evidence of honeycomb change. Increased bilateral ground-glass opacities, most evident in the superior portion of the right lower lobe right upper lobe and posterior left upper lobe. New subtle centrilobular ground-glass nodules also seen in the right upper lobe. Dendriform pulmonary ossification of the lower lobes again seen. No pleural effusion or pneumothorax. Upper Abdomen: No  acute abnormality. Musculoskeletal: No chest wall mass or suspicious bone lesions identified. IMPRESSION: 1. Fibrotic interstitial lung disease in a non UIP pattern with increased bilateral ground-glass opacities and new subtle centrilobular ground-glass nodules of the right upper lobe. Findings are likely due to superimposed infection, pulmonary edema, or acute exacerbation of ILD. Findings are suggestive of an alternative diagnosis (not UIP) per consensus guidelines: Diagnosis of Idiopathic Pulmonary Fibrosis: An Official ATS/ERS/JRS/ALAT Clinical Practice Guideline. [place], Iss 5, [id number], Apr 12 2017. 2. Severe left main and three-vessel coronary artery calcifications. 3. Aortic Atherosclerosis (ICD10-I70.0). Electronically Signed   By: Yetta Glassman M.D.   On: 09/10/2022 15:51   [name] Portable 1 View  Result Date: 09/10/2022 CLINICAL DATA:  Shortness of breath. EXAM: PORTABLE CHEST 1 VIEW COMPARISON:  07/20/2022. FINDINGS: The heart size and mediastinal contours are stable. There is atherosclerotic calcification of the aorta. Lung volumes are low. There is hazy airspace disease in the lungs bilaterally with consolidation at the left lung base. No definite effusion or pneumothorax. Evidence of prior rotator cuff surgery is noted on the left. No acute osseous abnormality. IMPRESSION: Hazy airspace disease in the lungs bilaterally with consolidation at the left lung base, concerning for pneumonia. Electronically Signed   By: Brett Fairy M.D.   On: 09/10/2022 04:34      Signature  -   Lala Lund M.D on 09/11/2022 at 9:45 AM   -  To page go to [email or web address]
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
## 2022-09-11 NOTE — Evaluation (Signed)
Physical Therapy Evaluation Patient Details Name: [name] MRN: [id number] DOB: [birth year] Today's Date: 09/11/2022  History of Present Illness  Pt is a 82 y/o M presenting to ED on 1/30 with SOB, admitted for acute on chronic respiratory failure with hypoxia due to ILD and CAP. PMH includes HTN, HLD, CAD, interstitial lung disease, chronic respiratory failure on 2L O2, myeloproliferative disorder with JAK mutation.  Clinical Impression   Pt presents with impaired activity tolerance and breathing technique, but presents with [facility] strength, balance on eval. Pt to benefit from acute PT to address deficits. Pt ambulated 3x90 ft without AD and supervision for safety, rest breaks needed to recover dyspnea on exertion and SpO2 drops to 84% on 4LO2, requiring 6LO2 still with drops to 86% post-exertion. PT to progress mobility as tolerated, and will continue to follow acutely.         Recommendations for follow up therapy are one component of a multi-disciplinary discharge planning process, led by the attending physician.  Recommendations may be updated based on patient status, additional functional criteria and insurance authorization.  Follow Up Recommendations No PT follow up      Assistance Recommended at Discharge PRN  Patient can return home with the following       Equipment Recommendations None recommended by PT  Recommendations for Other Services       Functional Status Assessment Patient has had a recent decline in their functional status and demonstrates the ability to make significant improvements in function in a reasonable and predictable amount of time.     Precautions / Restrictions Precautions Precautions: None Precaution Comments: watch O2 - 2LO2 chronically Restrictions Weight Bearing Restrictions: No      Mobility  Bed Mobility               General bed mobility comments: up in chair    Transfers Overall transfer level: Needs assistance Equipment  used: None Transfers: Sit to/from Stand Sit to Stand: Supervision           General transfer comment: for safety, lines/leads    Ambulation/Gait Ambulation/Gait assistance: Supervision Gait Distance (Feet): 90 Feet (x3 - standing rest breaks x2 minutes after each bout to recover sats) Assistive device: None Gait Pattern/deviations: Step-through pattern, Decreased stride length Gait velocity: decr     General Gait Details: for safety and lines/leads, cues for breathing technique and standing rest breaks as needed. SPO2 84% during gait on 4LO2, increased to 6LO2 during gait with range from 86-90%  Stairs            Wheelchair Mobility    Modified Rankin (Stroke Patients Only)       Balance Overall balance assessment: No apparent balance deficits (not formally assessed)                                           Pertinent Vitals/Pain Pain Assessment Pain Assessment: No/denies pain    Home Living Family/patient expects to be discharged to:: Private residence Living Arrangements: Spouse/significant other Available Help at Discharge: Family;Available PRN/intermittently Type of Home: House Home Access: Stairs to enter   Entrance Stairs-Number of Steps: 1 Alternate Level Stairs-Number of Steps: 3 flights of stairs Home Layout: Multi-level Home Equipment: None      Prior Function Prior Level of Function : Independent/Modified Independent             Mobility  Comments: no AD ADLs Comments: reports difficulty with LB dressing, says he is able to "get it done" with increased time     Hand Dominance   Dominant Hand: Right    Extremity/Trunk Assessment   Upper Extremity Assessment Upper Extremity Assessment: Defer to OT evaluation    Lower Extremity Assessment Lower Extremity Assessment: Overall WFL for tasks assessed    Cervical / Trunk Assessment Cervical / Trunk Assessment: Normal  Communication   Communication: No  difficulties  Cognition Arousal/Alertness: Awake/alert Behavior During Therapy: WFL for tasks assessed/performed Overall Cognitive Status: Within Functional Limits for tasks assessed                                          General Comments      Exercises     Assessment/Plan    PT Assessment Patient needs continued PT services  PT Problem List Decreased safety awareness;Cardiopulmonary status limiting activity;Decreased activity tolerance       PT Treatment Interventions DME instruction;Therapeutic activities;Gait training;Therapeutic exercise;Patient/family education;Balance training;Stair training;Functional mobility training;Neuromuscular re-education    PT Goals (Current goals can be found in the Care Plan section)  Acute Rehab PT Goals Patient Stated Goal: home PT Goal Formulation: With patient Time For Goal Achievement: 09/25/22 Potential to Achieve Goals: Good    Frequency Min 3X/week     Co-evaluation               AM-PAC PT "6 Clicks" Mobility  Outcome Measure Help needed turning from your back to your side while in a flat bed without using bedrails?: None Help needed moving from lying on your back to sitting on the side of a flat bed without using bedrails?: None Help needed moving to and from a bed to a chair (including a wheelchair)?: None Help needed standing up from a chair using your arms (e.g., wheelchair or bedside chair)?: None Help needed to walk in hospital room?: None Help needed climbing 3-5 steps with a railing? : A Little 6 Click Score: 23    End of Session Equipment Utilized During Treatment: Oxygen Activity Tolerance: Patient tolerated treatment well Patient left: in chair;with call bell/phone within reach Nurse Communication: Mobility status PT Visit Diagnosis: Other abnormalities of gait and mobility (R26.89)    Time: 2919-1660 PT Time Calculation (min) (ACUTE ONLY): 26 min   Charges:   PT Evaluation $PT Eval  Low Complexity: 1 Low         Nikya Busler S, PT DPT Acute Rehabilitation Services Pager [phone]  Office [phone]   Louis Matte 09/11/2022, 4:27 PM

## 2022-09-11 NOTE — Progress Notes (Signed)
NAME:  [name], MRN:  [id number], DOB:  February 19, 1941, LOS: 1 ADMISSION DATE:  09/10/2022, CONSULTATION DATE: 09/10/2022 REFERRING MD: [facility], CHIEF COMPLAINT: Acute respiratory failure   History of Present Illness:  [name] is a 82 year old male who carries a diagnosis of interstitial lung disease with an episode of December requiring oxygen and he was discharged home oxygen but she has not required the use of. He was seen creasingly short of breath overnight called EMS required oxygen therapy, steroids along with epinephrine and [facility].  She has been treated with epinephrine steroids antibiotics he is in no respiratory distress at the time of examination.  He is currently on 4 L nasal cannula with sats of 97% his respiratory rate is 24 his breathing is unlabored.  Agree with current interventions suspect the steroids and antibiotics helped the most.  Pertinent  Medical History   Past Medical History:  Diagnosis Date   Abnormal chest x-ray    Allergic rhinitis    Ankylosing spondylitis (HCC)    Anxiety    Arthritis    Colonic polyp    Coronary atherosclerosis of native coronary artery    Cough    Dizziness 10/21/2016   Ganglion cyst of wrist    left   Heart attack ([place]) 2008   Hemorrhoids    History of malaria    Hyperlipidemia    with low HDL   Hypertension    Kidney stone    Other disorder of muscle, ligament, and fascia    Rotator cuff tear    Swelling of lower extremity 10/16/2015     Significant Hospital Events: Including procedures, antibiotic start and stop dates in addition to other pertinent events     Interim History / Subjective:  Remains short of breath despite interventions   Objective   Blood pressure (!) 139/58, pulse 89, temperature 97.7 F (36.5 C), temperature source Oral, resp. rate 18, height '5\' 7"'$  (1.702 m), weight 71.8 kg, SpO2 90 %.        Intake/Output Summary (Last 24 hours) at  09/11/2022 0920 Last data filed at 09/11/2022 0900 Gross per 24 hour  Intake 819.27 ml  Output 1700 ml  Net -880.73 ml   Filed Weights   09/10/22 0416 09/11/22 0129  Weight: 72.6 kg 71.8 kg    Examination: General: Well-nourished well-developed male who reports continued difficulty breathing HEENT: MM pink/moist no JVD is appreciated Neuro: Grossly intact without focal defect CV: Heart sounds are regular PULM: Diminished breath sounds continued shortness of breath despite negative intake and output  GI: soft, bsx4 active  GU: Voids Extremities: warm/dry, 1+ edema  Skin: no rashes or lesions  Resolved Hospital Problem list     Assessment & Plan:  Acute on chronic hypoxic respiratory failure in the setting of known interstitial lung disease, coronary artery disease with a similar episode December 2023. Continue oxygen as needed CT of the chest reviewed Continue steroids Continue empirical antimicrobial therapy He is agreeable to fiberoptic bronchoscopy for further diagnostic evaluation per Dr. Lake Jordan    Myeloproliferative disorder Followed by [name]  Interstitial lung disease Followed by [name]  Coronary artery disease Status post stent Per cardiology   Best Practice (right click and "Reselect all SmartList Selections" daily)   Diet/type: Regular consistency (see orders) DVT prophylaxis: LMWH GI prophylaxis: PPI Lines: N/A Foley:  N/A Code Status:  full code Last date of multidisciplinary goals of care discussion [tbd]  Labs  CBC: Recent Labs  Lab 09/10/22 0415 09/10/22 0439 09/11/22 0137  WBC 35.6*  --  34.7*  NEUTROABS 23.6*  --  31.6*  HGB 11.0* 11.9* 10.1*  HCT 36.1* 35.0* 33.3*  MCV 98.9  --  97.1  PLT 340  --  871    Basic Metabolic Panel: Recent Labs  Lab 09/10/22 0415 09/10/22 0439 09/11/22 0137  NA 141 140 138  K 3.4* 3.3* 4.3  CL 100  --  100  CO2 27  --  27  GLUCOSE 132*  --  136*  BUN 14  --  18  CREATININE  0.80  --  0.73  CALCIUM 9.3  --  9.6   GFR: Estimated Creatinine Clearance: 70 mL/min (by C-G formula based on SCr of 0.73 mg/dL). Recent Labs  Lab 09/10/22 0415 09/10/22 0522 09/10/22 0840 09/11/22 0137  PROCALCITON  --   --  0.12  --   WBC 35.6*  --   --  34.7*  LATICACIDVEN  --  2.8* 2.9*  --     Liver Function Tests: No results for input(s): "AST", "ALT", "ALKPHOS", "BILITOT", "PROT", "ALBUMIN" in the last 168 hours. No results for input(s): "LIPASE", "AMYLASE" in the last 168 hours. No results for input(s): "AMMONIA" in the last 168 hours.  ABG    Component Value Date/Time   PHART 7.424 09/10/2022 0439   PCO2ART 43.1 09/10/2022 0439   PO2ART 59 (L) 09/10/2022 0439   HCO3 28.2 (H) 09/10/2022 0439   TCO2 30 09/10/2022 0439   O2SAT 91 09/10/2022 0439     Coagulation Profile: No results for input(s): "INR", "PROTIME" in the last 168 hours.  Cardiac Enzymes: No results for input(s): "CKTOTAL", "CKMB", "CKMBINDEX", "TROPONINI" in the last 168 hours.  HbA1C: Hgb A1c MFr Bld  Date/Time Value Ref Range Status  07/22/2022 04:32 AM 5.4 4.8 - 5.6 % Final    Comment:    (NOTE)         Prediabetes: 5.7 - 6.4         Diabetes: >6.4         Glycemic control for adults with diabetes: <7.0   02/07/2022 10:28 AM 5.7 4.6 - 6.5 % Final    Comment:    Glycemic Control Guidelines for People with Diabetes:Non Diabetic:  <6%Goal of Therapy: <7%Additional Action Suggested:  >8%     CBG: No results for input(s): "GLUCAP" in the last 168 hours.    [name] ACNP Acute Care Nurse Practitioner [place] Please consult Amion 09/11/2022, 9:20 AM

## 2022-09-11 NOTE — Plan of Care (Signed)
  Problem: Education: Goal: Knowledge of General Education information will improve Description: Including pain rating scale, medication(s)/side effects and non-pharmacologic comfort measures Outcome: Progressing   Problem: Clinical Measurements: Goal: Will remain free from infection Outcome: Progressing Goal: Respiratory complications will improve Outcome: Progressing   Problem: Activity: Goal: Risk for activity intolerance will decrease Outcome: Progressing   

## 2022-09-11 NOTE — Telephone Encounter (Signed)
CPAP titration approved  Scheduled Oct 03, 2022   Pt has been called

## 2022-09-12 ENCOUNTER — Inpatient Hospital Stay (HOSPITAL_COMMUNITY): Payer: Medicare HMO | Admitting: Certified Registered Nurse Anesthetist

## 2022-09-12 ENCOUNTER — Encounter (HOSPITAL_COMMUNITY): Payer: Self-pay | Admitting: Internal Medicine

## 2022-09-12 ENCOUNTER — Encounter (HOSPITAL_COMMUNITY)
Admission: EM | Disposition: A | Discharge status: Home / Self Care | Payer: Self-pay | Source: Home / Self Care | Attending: Internal Medicine

## 2022-09-12 ENCOUNTER — Inpatient Hospital Stay (HOSPITAL_COMMUNITY): Payer: Medicare HMO

## 2022-09-12 DIAGNOSIS — J9601 Acute respiratory failure with hypoxia: Secondary | ICD-10-CM

## 2022-09-12 DIAGNOSIS — I25119 Atherosclerotic heart disease of native coronary artery with unspecified angina pectoris: Secondary | ICD-10-CM | POA: Diagnosis not present

## 2022-09-12 DIAGNOSIS — J849 Interstitial pulmonary disease, unspecified: Secondary | ICD-10-CM | POA: Diagnosis not present

## 2022-09-12 DIAGNOSIS — I10 Essential (primary) hypertension: Secondary | ICD-10-CM | POA: Diagnosis not present

## 2022-09-12 DIAGNOSIS — J9621 Acute and chronic respiratory failure with hypoxia: Secondary | ICD-10-CM | POA: Diagnosis not present

## 2022-09-12 HISTORY — PX: BRONCHIAL WASHINGS: SHX5105

## 2022-09-12 HISTORY — PX: BRONCHIAL BIOPSY: SHX5109

## 2022-09-12 HISTORY — PX: VIDEO BRONCHOSCOPY: SHX5072

## 2022-09-12 LAB — PROCALCITONIN: Procalcitonin: 0.2 ng/mL

## 2022-09-12 LAB — BODY FLUID CELL COUNT WITH DIFFERENTIAL
Eos, Fluid: 0 %
Lymphs, Fluid: 1 %
Monocyte-Macrophage-Serous Fluid: 9 % — ABNORMAL LOW (ref 50–90)
Neutrophil Count, Fluid: 90 % — ABNORMAL HIGH (ref 0–25)
Total Nucleated Cell Count, Fluid: 198 cu mm (ref 0–1000)

## 2022-09-12 LAB — COMPREHENSIVE METABOLIC PANEL
ALT: 22 U/L (ref 0–44)
AST: 32 U/L (ref 15–41)
Albumin: 3.8 g/dL (ref 3.5–5.0)
Alkaline Phosphatase: 95 U/L (ref 38–126)
Anion gap: 11 (ref 5–15)
BUN: 20 mg/dL (ref 8–23)
CO2: 29 mmol/L (ref 22–32)
Calcium: 9.6 mg/dL (ref 8.9–10.3)
Chloride: 96 mmol/L — ABNORMAL LOW (ref 98–111)
Creatinine, Ser: 1.07 mg/dL (ref 0.61–1.24)
GFR, Estimated: >60 mL/min (ref >60)
Glucose, Bld: 107 mg/dL — ABNORMAL HIGH (ref 70–99)
Potassium: 3.7 mmol/L (ref 3.5–5.1)
Sodium: 136 mmol/L (ref 135–145)
Total Bilirubin: 0.7 mg/dL (ref 0.3–1.2)
Total Protein: 7.2 g/dL (ref 6.5–8.1)

## 2022-09-12 LAB — CBC WITH DIFFERENTIAL/PLATELET
Abs Immature Granulocytes: 2.1 10*3/uL — ABNORMAL HIGH (ref 0.00–0.07)
Basophils Absolute: 0 10*3/uL (ref 0.0–0.1)
Basophils Relative: 0 %
Eosinophils Absolute: 0 10*3/uL (ref 0.0–0.5)
Eosinophils Relative: 0 %
HCT: 36.3 % — ABNORMAL LOW (ref 39.0–52.0)
Hemoglobin: 11.1 g/dL — ABNORMAL LOW (ref 13.0–17.0)
Lymphocytes Relative: 2 %
Lymphs Abs: 0.7 10*3/uL (ref 0.7–4.0)
MCH: 29.5 pg (ref 26.0–34.0)
MCHC: 30.6 g/dL (ref 30.0–36.0)
MCV: 96.5 fL (ref 80.0–100.0)
Metamyelocytes Relative: 2 %
Monocytes Absolute: 2.5 10*3/uL — ABNORMAL HIGH (ref 0.1–1.0)
Monocytes Relative: 7 %
Myelocytes: 4 %
Neutro Abs: 29.8 10*3/uL — ABNORMAL HIGH (ref 1.7–7.7)
Neutrophils Relative %: 85 %
Platelets: 328 10*3/uL (ref 150–400)
RBC: 3.76 MIL/uL — ABNORMAL LOW (ref 4.22–5.81)
RDW: 24.2 % — ABNORMAL HIGH (ref 11.5–15.5)
WBC: 35 10*3/uL — ABNORMAL HIGH (ref 4.0–10.5)
nRBC: 0 /100 WBC
nRBC: 0.4 % — ABNORMAL HIGH (ref 0.0–0.2)

## 2022-09-12 LAB — ANCA PROFILE
Anti-MPO Antibodies: <0.2 units (ref 0.0–0.9)
Anti-PR3 Antibodies: <0.2 units (ref 0.0–0.9)
Atypical P-ANCA titer: <1:20 {titer}
C-ANCA: <1:20 {titer}
P-ANCA: <1:20 {titer}

## 2022-09-12 LAB — BRAIN NATRIURETIC PEPTIDE: B Natriuretic Peptide: 174.5 pg/mL — ABNORMAL HIGH (ref 0.0–100.0)

## 2022-09-12 LAB — C-REACTIVE PROTEIN: CRP: 2 mg/dL — ABNORMAL HIGH (ref <1.0)

## 2022-09-12 LAB — MAGNESIUM: Magnesium: 2 mg/dL (ref 1.7–2.4)

## 2022-09-12 LAB — LEGIONELLA PNEUMOPHILA SEROGP 1 UR AG: L. pneumophila Serogp 1 Ur Ag: NEGATIVE

## 2022-09-12 SURGERY — BRONCHOSCOPY, WITH FLUOROSCOPY
Anesthesia: General

## 2022-09-12 MED ORDER — PROPOFOL 10 MG/ML IV BOLUS
INTRAVENOUS | Status: DC | PRN
  Administered 2022-09-12: 150 mg via INTRAVENOUS

## 2022-09-12 MED ORDER — LIDOCAINE 2% (20 MG/ML) 5 ML SYRINGE
INTRAMUSCULAR | Status: DC | PRN
  Administered 2022-09-12: 60 mg via INTRAVENOUS

## 2022-09-12 MED ORDER — FENTANYL CITRATE (PF) 100 MCG/2ML IJ SOLN
INTRAMUSCULAR | Status: AC
  Filled 2022-09-12: qty 2

## 2022-09-12 MED ORDER — SUGAMMADEX SODIUM 200 MG/2ML IV SOLN
INTRAVENOUS | Status: DC | PRN
  Administered 2022-09-12: 280 mg via INTRAVENOUS

## 2022-09-12 MED ORDER — LACTATED RINGERS IV SOLN: INTRAVENOUS | Status: DC | PRN

## 2022-09-12 MED ORDER — FENTANYL CITRATE (PF) 250 MCG/5ML IJ SOLN
INTRAMUSCULAR | Status: DC | PRN
  Administered 2022-09-12: 100 ug via INTRAVENOUS

## 2022-09-12 MED ORDER — PHENYLEPHRINE 80 MCG/ML (10ML) SYRINGE FOR IV PUSH (FOR BLOOD PRESSURE SUPPORT)
PREFILLED_SYRINGE | INTRAVENOUS | Status: DC | PRN
  Administered 2022-09-12 (×2): 160 ug via INTRAVENOUS

## 2022-09-12 MED ORDER — ONDANSETRON HCL 4 MG/2ML IJ SOLN
INTRAMUSCULAR | Status: DC | PRN
  Administered 2022-09-12: 4 mg via INTRAVENOUS

## 2022-09-12 MED ORDER — GUAIFENESIN-DM 100-10 MG/5ML PO SYRP
5.0000 mL | ORAL_SOLUTION | ORAL | Status: DC | PRN
  Administered 2022-09-12 – 2022-09-13 (×4): 5 mL via ORAL
  Filled 2022-09-12 (×4): qty 5

## 2022-09-12 MED ORDER — ROCURONIUM BROMIDE 10 MG/ML (PF) SYRINGE
PREFILLED_SYRINGE | INTRAVENOUS | Status: DC | PRN
  Administered 2022-09-12: 70 mg via INTRAVENOUS

## 2022-09-12 MED ORDER — DEXAMETHASONE SODIUM PHOSPHATE 10 MG/ML IJ SOLN
INTRAMUSCULAR | Status: DC | PRN
  Administered 2022-09-12: 10 mg via INTRAVENOUS

## 2022-09-12 MED ORDER — FUROSEMIDE 10 MG/ML IJ SOLN
40.0000 mg | Freq: Once | INTRAMUSCULAR | Status: AC
  Administered 2022-09-12: 40 mg via INTRAVENOUS
  Filled 2022-09-12: qty 4

## 2022-09-12 MED ORDER — POTASSIUM CHLORIDE CRYS ER 20 MEQ PO TBCR
40.0000 meq | EXTENDED_RELEASE_TABLET | Freq: Once | ORAL | Status: AC
  Administered 2022-09-12: 40 meq via ORAL
  Filled 2022-09-12: qty 2

## 2022-09-12 NOTE — Progress Notes (Signed)
This encounter was created in error - please disregard.

## 2022-09-12 NOTE — Anesthesia Preprocedure Evaluation (Signed)
Anesthesia Evaluation  Patient identified by MRN, date of birth, ID band Patient awake    Reviewed: Allergy & Precautions, H&P , NPO status , Patient's Chart, lab work & pertinent test results  Airway Mallampati: II  TM Distance: >3 FB Neck ROM: Full    Dental no notable dental hx.    Pulmonary asthma , sleep apnea , COPD   Pulmonary exam normal breath sounds clear to auscultation       Cardiovascular hypertension, + angina  + CAD and + Past MI  Normal cardiovascular exam Rhythm:Regular Rate:Normal     Neuro/Psych   Anxiety     negative neurological ROS  negative psych ROS   GI/Hepatic Neg liver ROS,GERD  ,,  Endo/Other  negative endocrine ROS    Renal/GU negative Renal ROS  negative genitourinary   Musculoskeletal  (+) Arthritis , Osteoarthritis,    Abdominal   Peds negative pediatric ROS (+)  Hematology  (+) Blood dyscrasia, anemia   Anesthesia Other Findings   Reproductive/Obstetrics negative OB ROS                             Anesthesia Physical Anesthesia Plan  ASA: 3  Anesthesia Plan: General   Post-op Pain Management: Dilaudid IV   Induction: Intravenous  PONV Risk Score and Plan: 2 and Ondansetron, Midazolam and Treatment may vary due to age or medical condition  Airway Management Planned: Oral ETT  Additional Equipment:   Intra-op Plan:   Post-operative Plan: Extubation in OR  Informed Consent: I have reviewed the patients History and Physical, chart, labs and discussed the procedure including the risks, benefits and alternatives for the proposed anesthesia with the patient or authorized representative who has indicated his/her understanding and acceptance.     Dental advisory given  Plan Discussed with: CRNA  Anesthesia Plan Comments:        Anesthesia Quick Evaluation

## 2022-09-12 NOTE — Transfer of Care (Signed)
Immediate Anesthesia Transfer of Care Note  Patient: [name]  Procedure(s) Performed: VIDEO BRONCHOSCOPY WITH FLUORO BRONCHIAL BIOPSIES BRONCHIAL WASHINGS  Patient Location: Endoscopy Unit  Anesthesia Type:General  Level of Consciousness: awake, alert , and oriented  Airway & Oxygen Therapy: Patient Spontanous Breathing and Patient connected to face mask oxygen  Post-op Assessment: Report given to RN and Post -op Vital signs reviewed and stable  Post vital signs: Reviewed and stable  Last Vitals:  Vitals Value Taken Time  BP 129/55 09/12/22 1306  Temp 36.4 C 09/12/22 1306  Pulse 80 09/12/22 1306  Resp 22 09/12/22 1306  SpO2 94 % 09/12/22 1306    Last Pain:  Vitals:   09/12/22 1306  TempSrc: Temporal  PainSc: 0-No pain         Complications: No notable events documented.

## 2022-09-12 NOTE — Progress Notes (Signed)
PROGRESS NOTE                                                                                                                                                                                                             Patient Demographics:    [name], is a 82 y.o. male, DOB - 1940/09/06, UKG:[id number]  Outpatient Primary MD for the patient is [name], MD    LOS - 2  Admit date - 09/10/2022    Chief Complaint  Patient presents with   Shortness of Breath       Brief Narrative (HPI from H&P)   82 y.o. male with medical history significant of hypertension, hyperlipidemia, CAD, interstitial lung disease, chronic respiratory failure with hypoxia on 2 L as needed, and myeloproliferative disorder with JAK mutation followed by Dr. Earlie Server who presents with complaints of shortness of breath.  Patient had been hospitalized 07/20/2022-07/23/2022 with acute respiratory failure with hypoxia secondary to interstitial lung disease he had been placed on prophylactic Bactrim due to for concern of  PCP given patient being exposed to bird droppings, stabilized and discharged home on 2 L of oxygen.  He has since gotten rid of his birds, subsequently started on a prednisone taper however he continued to develop progressively worse shortness of breath and was admitted to the hospital.   Subjective:   Patient in bed, appears comfortable, denies any headache, no fever, no chest pain or pressure, improved orthopnea and shortness of breath , no abdominal pain. No new focal weakness.    Assessment  & Plan :   Acute on chronic hypoxic respiratory failure.  Recent diagnosis of possible ILD and now diagnosed with CAP.  He had recent hospitalization with similar problems, pulmonary critical care is following the patient, he is currently on empiric IV antibiotics to cover for CAP however he does give significant history of weight gain, lower  extremity edema and significant orthopnea.  Will give him a trial of IV Lasix, encouraged to sit in chair use I-S and flutter valve in daytime, continue oxygen supplementation and advance activity.  Appreciate pulmonary follow-up, defer further management of ILD to pulmonary team.  With initial diuresis on 09/11/2022 oxygen demand has gone down from 6 L to 4 L, improvement in orthopnea.  SpO2: 98 % O2 Flow Rate (L/min): 4 L/min FiO2 (%):  36 %   Acute on chronic diastolic CHF.  EF 65% on recent echocardiogram done few weeks ago.  Lasix and monitor.    Recent diagnosis of ILD.  Continue home nebulizer treatments, steroids IV here, defer further workup and management to pulmonary.  CT noted.  Right upper lobe lung nodule.  Noted on CT scan.  Follow-up with primary pulmonologist [name] postdischarge.  CAD.  On aspirin and statin for secondary prevention.  No acute issues.  Recent echo noted and stable with preserved EF and chronic diastolic CHF.  Essential hypertension.  Blood pressure currently soft holding home dose Norvasc.  Dyslipidemia.  On statin.  Hypokalemia.  Replaced.  History of myeloproliferative disorder with chronic leukocytosis.  Has Jak mutation and follows with Dr. [name] hematology oncology, continue hydroxyurea while in the hospital there after follow-up with his primary oncologist.  Chronic normocytic anemia.  Due to above.  Monitor.       Condition - Fair  Family Communication  :  None  Code Status :  Full  Consults  :  PCCM  PUD Prophylaxis : PPI   Procedures  :     CT - 1. Fibrotic interstitial lung disease in a non UIP pattern with increased bilateral ground-glass opacities and new subtle centrilobular ground-glass nodules of the right upper lobe. Findings are likely due to superimposed infection, pulmonary edema, or acute exacerbation of ILD. Findings are suggestive of an alternative diagnosis (not UIP) per consensus guidelines: Diagnosis of  Idiopathic Pulmonary Fibrosis: An Official ATS/ERS/JRS/ALAT Clinical Practice Guideline. [place], Iss 5, [id number], Apr 12 2017. 2. Severe left main and three-vessel coronary artery calcifications. 3. Aortic Atherosclerosis (ICD10-I70.0).      Disposition Plan  :    Status is: Inpatient   DVT Prophylaxis  :    Place TED hose Start: 09/11/22 0746 enoxaparin (LOVENOX) injection 40 mg Start: 09/10/22 2200    Lab Results  Component Value Date   PLT 328 09/12/2022    Diet :  Diet Order             Diet NPO time specified  Diet effective midnight                    Inpatient Medications  Scheduled Meds:  arformoterol  15 mcg Nebulization BID   aspirin EC  81 mg Oral Daily   azithromycin  500 mg Oral Daily   budesonide (PULMICORT) nebulizer solution  0.5 mg Nebulization BID   cholecalciferol  2,000 Units Oral Daily   enoxaparin (LOVENOX) injection  40 mg Subcutaneous Q24H   furosemide  40 mg Intravenous Once   guaiFENesin  600 mg Oral BID   hydroxyurea  500 mg Oral Daily   methylPREDNISolone (SOLU-MEDROL) injection  40 mg Intravenous Daily   multivitamin  1 tablet Oral Daily   pantoprazole  40 mg Oral BID   potassium chloride  40 mEq Oral Once   simvastatin  20 mg Oral Q breakfast   sodium chloride flush  3 mL Intravenous Q12H   Continuous Infusions:  cefTRIAXone (ROCEPHIN)  IV 2 g (09/12/22 0820)   PRN Meds:.acetaminophen **OR** acetaminophen, albuterol, guaiFENesin-dextromethorphan, ondansetron **OR** ondansetron (ZOFRAN) IV    Objective:   Vitals:   09/12/22 0000 09/12/22 0414 09/12/22 0722 09/12/22 0735  BP: (!) 129/57 (!) 130/58 (!) 132/54   Pulse: 73 82 75   Resp: '18 19 18   '$ Temp: 98.2 F (36.8 C) 98 F (36.7  C) 98.4 F (36.9 C)   TempSrc: Oral Oral Oral   SpO2: 91% 93% 98% 98%  Weight: 69.6 kg     Height:        Wt Readings from Last 3 Encounters:  09/12/22 69.6 kg  08/27/22 73.3 kg  08/23/22 73.3 kg      Intake/Output Summary (Last 24 hours) at 09/12/2022 0936 Last data filed at 09/12/2022 0800 Gross per 24 hour  Intake 704 ml  Output 2525 ml  Net -1821 ml     Physical Exam  Awake Alert, No new F.N deficits, Normal affect [place].AT,PERRAL Supple Neck, No JVD,   Symmetrical Chest wall movement, Good air movement bilaterally, fine rales RRR,No Gallops,Rubs or new Murmurs,  +ve B.Sounds, Abd Soft, No tenderness,   trace leg edema   Data Review:    Recent Labs  Lab 09/10/22 0415 09/10/22 0439 09/11/22 0137 09/12/22 0024  WBC 35.6*  --  34.7* 35.0*  HGB 11.0* 11.9* 10.1* 11.1*  HCT 36.1* 35.0* 33.3* 36.3*  PLT 340  --  305 328  MCV 98.9  --  97.1 96.5  MCH 30.1  --  29.4 29.5  MCHC 30.5  --  30.3 30.6  RDW 23.9*  --  24.2* 24.2*  LYMPHSABS 1.7  --  0.3* 0.7  MONOABS 3.6*  --  2.8* 2.5*  EOSABS 0.1  --  0.0 0.0  BASOSABS 0.3*  --  0.0 0.0    Recent Labs  Lab 09/10/22 0414 09/10/22 0415 09/10/22 0439 09/10/22 0522 09/10/22 0840 09/11/22 0137 09/12/22 0024  NA  --  141 140  --   --  138 136  K  --  3.4* 3.3*  --   --  4.3 3.7  CL  --  100  --   --   --  100 96*  CO2  --  27  --   --   --  27 29  ANIONGAP  --  14  --   --   --  11 11  GLUCOSE  --  132*  --   --   --  136* 107*  BUN  --  14  --   --   --  18 20  CREATININE  --  0.80  --   --   --  0.73 1.07  AST  --   --   --   --   --   --  32  ALT  --   --   --   --   --   --  22  ALKPHOS  --   --   --   --   --   --  95  BILITOT  --   --   --   --   --   --  0.7  ALBUMIN  --   --   --   --   --   --  3.8  CRP  --  1.5*  --   --   --   --  2.0*  PROCALCITON  --   --   --   --  0.12  --  0.20  LATICACIDVEN  --   --   --  2.8* 2.9*  --   --   BNP 272.4*  --   --   --   --   --  174.5*  MG  --   --   --   --   --   --  2.0  CALCIUM  --  9.3  --   --   --  9.6 9.6    Radiology Reports [name] Port 1 View  Result Date: 09/12/2022 CLINICAL DATA:  Shortness of breath EXAM: PORTABLE CHEST 1 VIEW COMPARISON:   Yesterday FINDINGS: Bilateral airspace opacity which is progressed. Lung volumes are low. Partially obscured heart size which is unchanged. Asymmetric elevation of the right diaphragm. IMPRESSION: Low volume chest with worsening aeration, reference recent chest CT. Electronically Signed   By: Jorje Guild M.D.   On: 09/12/2022 06:41   [name] Port 1 View  Result Date: 09/11/2022 CLINICAL DATA:  Shortness of breath. EXAM: PORTABLE CHEST 1 VIEW COMPARISON:  09/10/2022 FINDINGS: 0649 hours. Low volume film with stable asymmetric elevation right hemidiaphragm. The cardio pericardial silhouette is enlarged. Volume loss again noted left hemithorax. There is diffuse interstitial and patchy airspace disease in both lungs, most pronounced at the left base. The visualized bony structures of the thorax are unremarkable. Telemetry leads overlie the chest. IMPRESSION: Low volume film with diffuse interstitial and patchy airspace disease in both lungs, most pronounced at the left base. Imaging features compatible with interstitial lung disease/fibrosis as characterized on yesterday's high-resolution chest CT. Electronically Signed   By: Misty Stanley M.D.   On: 09/11/2022 06:55   CT Chest High Resolution  Result Date: 09/10/2022 CLINICAL DATA:  Interstitial lung disease EXAM: CT CHEST WITHOUT CONTRAST TECHNIQUE: Multidetector CT imaging of the chest was performed following the standard protocol without intravenous contrast. High resolution imaging of the lungs, as well as inspiratory and expiratory imaging, was performed. RADIATION DOSE REDUCTION: This exam was performed according to the departmental dose-optimization program which includes automated exposure control, adjustment of the mA and/or kV according to patient size and/or use of iterative reconstruction technique. COMPARISON:  Multiple priors, most recent CT dated July 20, 2022 FINDINGS: Cardiovascular: Normal heart size. Pericardial effusion. Normal  caliber thoracic aorta with moderate calcified plaque. Severe left main and three-vessel coronary artery calcifications. Mediastinum/Nodes: Small hiatal hernia. Thyroid is unremarkable. Mildly enlarged mediastinal and hilar lymph nodes, similar to prior exam and likely reactive. Lungs/Pleura: Central airways are patent. Evidence of mild-to-moderate air trapping. Elevation of the right hemidiaphragm. Peribronchovascular mid and lower lung predominant reticular opacities with associated traction bronchiectasis, difficult to assess for progressive fibrotic changes given superimposed acute ground-glass opacities. No evidence of honeycomb change. Increased bilateral ground-glass opacities, most evident in the superior portion of the right lower lobe right upper lobe and posterior left upper lobe. New subtle centrilobular ground-glass nodules also seen in the right upper lobe. Dendriform pulmonary ossification of the lower lobes again seen. No pleural effusion or pneumothorax. Upper Abdomen: No acute abnormality. Musculoskeletal: No chest wall mass or suspicious bone lesions identified. IMPRESSION: 1. Fibrotic interstitial lung disease in a non UIP pattern with increased bilateral ground-glass opacities and new subtle centrilobular ground-glass nodules of the right upper lobe. Findings are likely due to superimposed infection, pulmonary edema, or acute exacerbation of ILD. Findings are suggestive of an alternative diagnosis (not UIP) per consensus guidelines: Diagnosis of Idiopathic Pulmonary Fibrosis: An Official ATS/ERS/JRS/ALAT Clinical Practice Guideline. [place], Iss 5, [id number], Apr 12 2017. 2. Severe left main and three-vessel coronary artery calcifications. 3. Aortic Atherosclerosis (ICD10-I70.0). Electronically Signed   By: Yetta Glassman M.D.   On: 09/10/2022 15:51   [name] Portable 1 View  Result Date: 09/10/2022 CLINICAL DATA:  Shortness of breath. EXAM: PORTABLE CHEST 1  VIEW  COMPARISON:  07/20/2022. FINDINGS: The heart size and mediastinal contours are stable. There is atherosclerotic calcification of the aorta. Lung volumes are low. There is hazy airspace disease in the lungs bilaterally with consolidation at the left lung base. No definite effusion or pneumothorax. Evidence of prior rotator cuff surgery is noted on the left. No acute osseous abnormality. IMPRESSION: Hazy airspace disease in the lungs bilaterally with consolidation at the left lung base, concerning for pneumonia. Electronically Signed   By: Brett Fairy M.D.   On: 09/10/2022 04:34      Signature  -   Lala Lund M.D on 09/12/2022 at 9:36 AM   -  To page go to [email or web address]

## 2022-09-12 NOTE — H&P (Signed)
[facility]  CC: dyspnea  HPI: 83 y/o male with ILD, worsening, here for bronchoscopy to assess further.  See note from 1/31 for further details.  Past Medical History:  Diagnosis Date   Abnormal chest x-ray    Allergic rhinitis    Ankylosing spondylitis (HCC)    Anxiety    Arthritis    Colonic polyp    Coronary atherosclerosis of native coronary artery    Cough    Dizziness 10/21/2016   Ganglion cyst of wrist    left   Heart attack ([place]) 2008   Hemorrhoids    History of malaria    Hyperlipidemia    with low HDL   Hypertension    Kidney stone    Other disorder of muscle, ligament, and fascia    Rotator cuff tear    Swelling of lower extremity 10/16/2015     Family History  Problem Relation Age of Onset   Cancer Mother        melanoma   Emphysema Mother    Rheum arthritis Mother    Diabetes Mother    Alcohol abuse Father      Social History   Socioeconomic History   Marital status: Married    Spouse name: [name]   Number of children: 2   Years of education: Not on file   Highest education level: Not on file  Occupational History   Not on file  Tobacco Use   Smoking status: Never    Passive exposure: Never   Smokeless tobacco: Never  Vaping Use   Vaping Use: Never used  Substance and Sexual Activity   Alcohol use: Yes    Comment: holiday Rare    Drug use: No   Sexual activity: Not Currently  Other Topics Concern   Not on file  Social History Narrative   Not on file   Social Determinants of Health   Financial Resource Strain: Low Risk  (04/03/2022)   Overall Financial Resource Strain (CARDIA)    Difficulty of Paying Living Expenses: Not hard at all  Food Insecurity: No Food Insecurity (09/11/2022)   Hunger Vital Sign    Worried About Running Out of Food in the Last Year: Never true    Ran Out of Food in the Last Year: Never true  Transportation Needs: No Transportation Needs (09/11/2022)   PRAPARE - Hydrologist  (Medical): No    Lack of Transportation (Non-Medical): No  Physical Activity: Inactive (04/03/2022)   Exercise Vital Sign    Days of Exercise per Week: 0 days    Minutes of Exercise per Session: 0 min  Stress: No Stress Concern Present (04/03/2022)   [place]    Feeling of Stress : Not at all  Social Connections: Moderately Integrated (04/03/2022)   Social Connection and Isolation Panel [NHANES]    Frequency of Communication with Friends and Family: Three times a week    Frequency of Social Gatherings with Friends and Family: Three times a week    Attends Religious Services: Never    Active Member of Clubs or Organizations: Yes    Attends Archivist Meetings: More than 4 times per year    Marital Status: Married  Human resources officer Violence: Not At Risk (09/11/2022)   Humiliation, Afraid, Rape, and Kick questionnaire    Fear of Current or Ex-Partner: No    Emotionally Abused: No    Physically Abused: No  Sexually Abused: No     Allergies  Allergen Reactions   Celecoxib Other (See Comments)    Pt states precipitated his heart attack.   Tramadol Other (See Comments)    Makes patient jumpy     @encmedstart @ Vitals:   09/12/22 0722 09/12/22 0735 09/12/22 1045 09/12/22 1121  BP: (!) 132/54  (!) 117/54 117/64  Pulse: 75  77 72  Resp: 18  17 (!) 21  Temp: 98.4 F (36.9 C)  98.1 F (36.7 C) (!) 97.4 F (36.3 C)  TempSrc: Oral  Oral Temporal  SpO2: 98% 98% 91% 94%  Weight:      Height:       General:  Resting comfortably in bed HENT: NCAT OP clear PULM: CTA B, normal effort CV: RRR, no mgr GI: BS+, soft, nontender MSK: normal bulk and tone Neuro: awake, alert, no distress, MAEW  CBC    Component Value Date/Time   WBC 35.0 (H) 09/12/2022 0024   RBC 3.76 (L) 09/12/2022 0024   HGB 11.1 (L) 09/12/2022 0024   HGB 10.5 (L) 08/27/2022 0744   HGB 15.5 04/21/2017 0823   HCT 36.3 (L) 09/12/2022 0024    HCT 45.4 04/21/2017 0823   PLT 328 09/12/2022 0024   PLT 223 08/27/2022 0744   PLT 79 Few Large & Occ giant platelets (L) 04/21/2017 0823   MCV 96.5 09/12/2022 0024   MCV 88.7 05/19/2017 1204   MCV 91.2 04/21/2017 0823   MCH 29.5 09/12/2022 0024   MCHC 30.6 09/12/2022 0024   RDW 24.2 (H) 09/12/2022 0024   RDW 14.6 04/21/2017 0823   LYMPHSABS 0.7 09/12/2022 0024   LYMPHSABS 1.4 04/21/2017 0823   MONOABS 2.5 (H) 09/12/2022 0024   MONOABS 0.6 04/21/2017 0823   EOSABS 0.0 09/12/2022 0024   EOSABS 0.0 04/21/2017 0823   BASOSABS 0.0 09/12/2022 0024   BASOSABS 0.0 04/21/2017 0823   BMET    Component Value Date/Time   NA 136 09/12/2022 0024   NA 141 01/03/2020 0832   NA 141 04/21/2017 0823   K 3.7 09/12/2022 0024   K 4.0 04/21/2017 0823   CL 96 (L) 09/12/2022 0024   CL 106 09/30/2012 0949   CO2 29 09/12/2022 0024   CO2 25 04/21/2017 0823   GLUCOSE 107 (H) 09/12/2022 0024   GLUCOSE 126 04/21/2017 0823   GLUCOSE 198 (H) 09/30/2012 0949   BUN 20 09/12/2022 0024   BUN 20 01/03/2020 0832   BUN 20.2 04/21/2017 0823   CREATININE 1.07 09/12/2022 0024   CREATININE 0.79 08/27/2022 0744   CREATININE 0.9 04/21/2017 0823   CALCIUM 9.6 09/12/2022 0024   CALCIUM 9.6 04/21/2017 0823   EGFR 85 (L) 04/21/2017 0823   GFRNONAA >60 09/12/2022 0024   GFRNONAA >60 08/27/2022 0744   GFRNONAA 84 11/28/2015 1008   Impression: ILD Acute on chronic respiratory failure with hypoxemia CAP  Plan; Bronchoscopy with BAL, TBBX  Roselie Awkward, MD [place] [facility] Pager: [phone] Cell: [phone] After 7:00 pm call Elink  [phone]

## 2022-09-12 NOTE — Op Note (Signed)
PCCM Video Bronchoscopy Procedure Note  The patient was informed of the risks (including but not limited to bleeding, infection, respiratory failure, lung injury, tooth/oral injury) and benefits of the procedure and gave consent, see chart.  Indication: ILD with acute exacerbation, CAP, acute respiratory failure with hypoxemia  Post Procedure Diagnosis: same  Location: [facility]  Condition pre procedure: moderately ill  Medications for procedure: general anesthesia  Procedure description: The bronchoscope was introduced through the endotracheal tube and passed to the bilateral lungs to the level of the subsegmental bronchi throughout the tracheobronchial tree.  Airway exam revealed normal caliber trachea, sharp carina, normal tracheobronchial tree bilaterally without secretions, airway lesion, mass or inflammatory changes.  No bleeding.    Procedures performed:  1) BAL Superior segment lingula 2) Transbronchial biopsy lingula: 6 passes, 5 samples  Specimens sent:  1) BAL: cytology, cell count with diff, routine bacterial culture, fungal culture, AFB culture 2) Transbronchial biopsy: routine histology  Condition post procedure:  stable  EBL: minimal  Complications: none immediate, CXR pending  Instructions:  Follow up culture, cytology, cell count and pathology  Wife updated by phone  Roselie Awkward, MD [place] [facility] Pager: [phone] Cell: [phone] After 7:00 pm call Elink  [phone]

## 2022-09-12 NOTE — Anesthesia Postprocedure Evaluation (Signed)
Anesthesia Post Note  Patient: [name]  Procedure(s) Performed: VIDEO BRONCHOSCOPY WITH FLUORO BRONCHIAL BIOPSIES BRONCHIAL WASHINGS     Patient location during evaluation: PACU Anesthesia Type: General Level of consciousness: awake and alert Pain management: pain level controlled Vital Signs Assessment: post-procedure vital signs reviewed and stable Respiratory status: spontaneous breathing, nonlabored ventilation and respiratory function stable Cardiovascular status: blood pressure returned to baseline and stable Postop Assessment: no apparent nausea or vomiting Anesthetic complications: no   No notable events documented.  Last Vitals:  Vitals:   09/12/22 1340 09/12/22 1411  BP: (!) 110/53 (!) 106/51  Pulse: 81 76  Resp: 17 18  Temp:  36.6 C  SpO2: 90% 90%    Last Pain:  Vitals:   09/12/22 1411  TempSrc: Oral  PainSc: 0-No pain                 Lynda Rainwater

## 2022-09-12 NOTE — TOC Initial Note (Signed)
Transition of Care [facility]) - Initial/Assessment Note    Patient Details  Name: [name] MRN: [id number] Date of Birth: 12-09-1940  Transition of Care [facility]) CM/SW Contact:    Ninfa Meeker, RN Phone Number: 09/12/2022, 8:41 AM  Clinical Narrative:   Transition of Care Screening Note:                Pt is a 82 y/o M presenting to ED on 1/30 with SOB, admitted for acute on chronic respiratory failure with hypoxia due to ILD and CAP.    Transition of Care [facility]) Department has reviewed patient and no TOC needs have been identified at this time. We will continue to monitor patient advancement through Interdisciplinary progressions and if new patient needs arise, please place a consult.         Patient Goals and CMS Choice            Expected Discharge Plan and Services                                              Prior Living Arrangements/Services                       Activities of Daily Living Home Assistive Devices/Equipment: Blood pressure cuff, CPAP ADL Screening (condition at time of admission) Patient's cognitive ability adequate to safely complete daily activities?: Yes Is the patient deaf or have difficulty hearing?: No Does the patient have difficulty seeing, even when wearing glasses/contacts?: No Does the patient have difficulty concentrating, remembering, or making decisions?: No Patient able to express need for assistance with ADLs?: Yes Does the patient have difficulty dressing or bathing?: No Independently performs ADLs?: Yes (appropriate for developmental age) Does the patient have difficulty walking or climbing stairs?: No Weakness of Legs: None Weakness of Arms/Hands: None  Permission Sought/Granted                  Emotional Assessment              Admission diagnosis:  Acute on chronic respiratory failure with hypoxia ([place]) [J96.21] Pneumonia of both lungs due to infectious organism, unspecified part of  lung [J18.9] Patient Active Problem List   Diagnosis Date Noted   Acute on chronic respiratory failure with hypoxia ([place]) 09/10/2022   Sepsis due to pneumonia ([place]) 09/10/2022   Hypokalemia 09/10/2022   Normocytic anemia 09/10/2022   Acute respiratory failure with hypoxia ([place]) 07/20/2022   Myeloproliferative disorder ([place]) 07/20/2022   Leukocytosis 06/03/2022   Hypertrophy of prostate without urinary obstruction and other lower urinary tract symptoms (LUTS) 12/04/2021   Prostate cancer ([place]) 05/29/2021   GERD (gastroesophageal reflux disease) 12/15/2017   Unstable angina ([place]) 12/05/2017   Angina at rest 12/05/2017   Atypical chest pain    Dyslipidemia    COPD not affecting current episode of care    Essential hypertension 05/29/2017   Abnormal nuclear cardiac imaging test 01/14/2017   Bronchiectasis without complication ([place]) 40/98/1191   ILD (interstitial lung disease) ([place]) 11/27/2016   Dizziness 10/21/2016   Cough variant asthma  vs UACS 07/22/2016   Obstructive sleep apnea 12/01/2015   Acute bronchitis 11/30/2015   Swelling of lower extremity 10/16/2015   Benign mole 02/04/2014   Impaired glucose tolerance 10/26/2013   NAFLD (nonalcoholic fatty liver disease) 04/27/2013   Thrombocytopenia ([place]) 10/21/2012  Exertional angina 09/03/2012   MALARIA 12/05/2009   COLONIC POLYPS 12/05/2009   HYPERCHOLESTEROLEMIA 12/05/2009   ANXIETY 12/05/2009   HEMORRHOIDS 12/05/2009   Allergic rhinitis 12/05/2009   GANGLION CYST, WRIST, LEFT 12/05/2009   ROTATOR CUFF TEAR 12/05/2009   OTHER DISORDER OF MUSCLE LIGAMENT AND FASCIA 12/05/2009   ABNORMAL CHEST XRAY 12/05/2009   CAD (coronary artery disease) 04/05/2009   PCP:  [name], MD Pharmacy:   [facility] [facility] [place] - [address]  ([place]) [place] [address] Phone: [phone] Fax: [phone]     Social Determinants of Health (SDOH) Social History: SDOH Screenings    Food Insecurity: No Food Insecurity (09/11/2022)  Housing: Low Risk  (09/11/2022)  Transportation Needs: No Transportation Needs (09/11/2022)  Utilities: Not At Risk (09/11/2022)  Alcohol Screen: Low Risk  (04/03/2022)  Depression (PHQ2-9): Medium Risk (08/23/2022)  Financial Resource Strain: Low Risk  (04/03/2022)  Physical Activity: Inactive (04/03/2022)  Social Connections: Moderately Integrated (04/03/2022)  Stress: No Stress Concern Present (04/03/2022)  Tobacco Use: Low Risk  (09/10/2022)   SDOH Interventions:     Readmission Risk Interventions     No data to display

## 2022-09-12 NOTE — Anesthesia Procedure Notes (Signed)
Procedure Name: Intubation Date/Time: 09/12/2022 12:33 PM  Performed by: Bryson Corona, CRNAPre-anesthesia Checklist: Patient identified, Emergency Drugs available, Suction available and Patient being monitored Patient Re-evaluated:Patient Re-evaluated prior to induction Oxygen Delivery Method: Circle system utilized Preoxygenation: Pre-oxygenation with 100% oxygen Induction Type: IV induction Ventilation: Mask ventilation without difficulty Laryngoscope Size: Mac and 3 Grade View: Grade I Tube type: Oral Tube size: 8.5 mm Number of attempts: 1 Airway Equipment and Method: Stylet and Oral airway Placement Confirmation: ETT inserted through vocal cords under direct vision, positive ETCO2 and breath sounds checked- equal and bilateral Secured at: 22 cm Tube secured with: Tape Dental Injury: Teeth and Oropharynx as per pre-operative assessment

## 2022-09-13 DIAGNOSIS — J9621 Acute and chronic respiratory failure with hypoxia: Secondary | ICD-10-CM | POA: Diagnosis not present

## 2022-09-13 LAB — CBC WITH DIFFERENTIAL/PLATELET
Abs Immature Granulocytes: 0 10*3/uL (ref 0.00–0.07)
Basophils Absolute: 0 10*3/uL (ref 0.0–0.1)
Basophils Relative: 0 %
Eosinophils Absolute: 0 10*3/uL (ref 0.0–0.5)
Eosinophils Relative: 0 %
HCT: 33.8 % — ABNORMAL LOW (ref 39.0–52.0)
Hemoglobin: 10.3 g/dL — ABNORMAL LOW (ref 13.0–17.0)
Lymphocytes Relative: 5 %
Lymphs Abs: 1.4 10*3/uL (ref 0.7–4.0)
MCH: 29.8 pg (ref 26.0–34.0)
MCHC: 30.5 g/dL (ref 30.0–36.0)
MCV: 97.7 fL (ref 80.0–100.0)
Monocytes Absolute: 4.3 10*3/uL — ABNORMAL HIGH (ref 0.1–1.0)
Monocytes Relative: 15 %
Neutro Abs: 22.9 10*3/uL — ABNORMAL HIGH (ref 1.7–7.7)
Neutrophils Relative %: 80 %
Platelets: 263 10*3/uL (ref 150–400)
RBC: 3.46 MIL/uL — ABNORMAL LOW (ref 4.22–5.81)
RDW: 23.2 % — ABNORMAL HIGH (ref 11.5–15.5)
WBC: 28.6 10*3/uL — ABNORMAL HIGH (ref 4.0–10.5)
nRBC: 0 /100 WBC
nRBC: 0.2 % (ref 0.0–0.2)

## 2022-09-13 LAB — COMPREHENSIVE METABOLIC PANEL
ALT: 20 U/L (ref 0–44)
AST: 21 U/L (ref 15–41)
Albumin: 3.4 g/dL — ABNORMAL LOW (ref 3.5–5.0)
Alkaline Phosphatase: 93 U/L (ref 38–126)
Anion gap: 11 (ref 5–15)
BUN: 29 mg/dL — ABNORMAL HIGH (ref 8–23)
CO2: 32 mmol/L (ref 22–32)
Calcium: 9.3 mg/dL (ref 8.9–10.3)
Chloride: 94 mmol/L — ABNORMAL LOW (ref 98–111)
Creatinine, Ser: 0.95 mg/dL (ref 0.61–1.24)
GFR, Estimated: >60 mL/min (ref >60)
Glucose, Bld: 139 mg/dL — ABNORMAL HIGH (ref 70–99)
Potassium: 4.5 mmol/L (ref 3.5–5.1)
Sodium: 137 mmol/L (ref 135–145)
Total Bilirubin: 0.8 mg/dL (ref 0.3–1.2)
Total Protein: 6.7 g/dL (ref 6.5–8.1)

## 2022-09-13 LAB — RESPIRATORY PANEL BY PCR

## 2022-09-13 LAB — CULTURE, RESPIRATORY W GRAM STAIN: Culture: NORMAL

## 2022-09-13 LAB — PROCALCITONIN: Procalcitonin: 0.22 ng/mL

## 2022-09-13 LAB — BRAIN NATRIURETIC PEPTIDE: B Natriuretic Peptide: 192.8 pg/mL — ABNORMAL HIGH (ref 0.0–100.0)

## 2022-09-13 LAB — MAGNESIUM: Magnesium: 2.4 mg/dL (ref 1.7–2.4)

## 2022-09-13 LAB — C-REACTIVE PROTEIN: CRP: 3.9 mg/dL — ABNORMAL HIGH (ref <1.0)

## 2022-09-13 MED ORDER — FUROSEMIDE 10 MG/ML IJ SOLN
40.0000 mg | Freq: Once | INTRAMUSCULAR | Status: AC
  Administered 2022-09-13: 40 mg via INTRAVENOUS
  Filled 2022-09-13: qty 4

## 2022-09-13 MED ORDER — ORAL CARE MOUTH RINSE: 15.0000 mL | OROMUCOSAL | Status: DC | PRN

## 2022-09-13 MED ORDER — DOXAZOSIN MESYLATE 1 MG PO TABS
1.0000 mg | ORAL_TABLET | Freq: Every day | ORAL | Status: DC
  Administered 2022-09-13: 1 mg via ORAL
  Filled 2022-09-13 (×2): qty 1

## 2022-09-13 MED ORDER — METHYLPREDNISOLONE SODIUM SUCC 40 MG IJ SOLR
30.0000 mg | Freq: Every day | INTRAMUSCULAR | Status: DC
  Administered 2022-09-14: 30 mg via INTRAVENOUS
  Filled 2022-09-13 (×2): qty 1

## 2022-09-13 NOTE — Progress Notes (Signed)
PROGRESS NOTE                                                                                                                                                                                                             Patient Demographics:    [name], is a 82 y.o. male, DOB - Oct 06, 1940, GXQ:[id number]  Outpatient Primary MD for the patient is [name], MD    LOS - 3  Admit date - 09/10/2022    Chief Complaint  Patient presents with   Shortness of Breath       Brief Narrative (HPI from H&P)   82 y.o. male with medical history significant of hypertension, hyperlipidemia, CAD, interstitial lung disease, chronic respiratory failure with hypoxia on 2 L as needed, and myeloproliferative disorder with JAK mutation followed by Dr. Earlie Server who presents with complaints of shortness of breath.  Patient had been hospitalized 07/20/2022-07/23/2022 with acute respiratory failure with hypoxia secondary to interstitial lung disease he had been placed on prophylactic Bactrim due to for concern of  PCP given patient being exposed to bird droppings, stabilized and discharged home on 2 L of oxygen.  He has since gotten rid of his birds, subsequently started on a prednisone taper however he continued to develop progressively worse shortness of breath and was admitted to the hospital.   Subjective:   Patient in bed, appears comfortable, denies any headache, no fever, no chest pain or pressure, improving shortness of breath , no abdominal pain. No new focal weakness.   Assessment  & Plan :   Acute on chronic hypoxic respiratory failure.  Recent diagnosis of possible ILD and now diagnosed with CAP.  He had recent hospitalization with similar problems, pulmonary critical care is following the patient, he is currently on empiric IV antibiotics to cover for CAP however he does give significant history of weight gain, lower extremity edema  and significant orthopnea.  Will give him a trial of IV Lasix, encouraged to sit in chair use I-S and flutter valve in daytime, continue oxygen supplementation and advance activity.  Appreciate pulmonary follow-up, defer further management of ILD to pulmonary team, underwent bronchoscopy with biopsies on 09/12/2022, results pending.  With initial diuresis on 09/11/2022 oxygen demand has gone down from 6 L to 3.5 L, improvement in orthopnea.  SpO2: 93 % O2 Flow Rate (  L/min): 3.5 L/min FiO2 (%): 40 %   Acute on chronic diastolic CHF.  EF 65% on recent echocardiogram done few weeks ago.  Lasix and monitor.    Recent diagnosis of ILD.  Continue home nebulizer treatments, steroids IV here, defer further workup and management to pulmonary.  CT noted.  Right upper lobe lung nodule.  Noted on CT scan.  Follow-up with primary pulmonologist [name] postdischarge.  CAD.  On aspirin and statin for secondary prevention.  No acute issues.  Recent echo noted and stable with preserved EF and chronic diastolic CHF.  Essential hypertension.  Blood pressure currently soft holding home dose Norvasc.  Dyslipidemia.  On statin.  Hypokalemia.  Replaced.  History of myeloproliferative disorder with chronic leukocytosis.  Has Jak mutation and follows with Dr. [name] hematology oncology, continue hydroxyurea while in the hospital there after follow-up with his primary oncologist.  Chronic normocytic anemia.  Due to above.  Monitor.       Condition - Fair  Family Communication  :  None  Code Status :  Full  Consults  :  PCCM  PUD Prophylaxis : PPI   Procedures  :     Bronchoscopy by pulmonary critical care on 09/12/2022.  Dr. Lake Bells.    Procedure description: The bronchoscope was introduced through the endotracheal tube and passed to the bilateral lungs to the level of the subsegmental bronchi throughout the tracheobronchial tree.   Airway exam revealed normal caliber trachea, sharp carina,  normal tracheobronchial tree bilaterally without secretions, airway lesion, mass or inflammatory changes.  No bleeding.     Procedures performed:  1) BAL Superior segment lingula 2) Transbronchial biopsy lingula: 6 passes, 5 samples   Specimens sent:  1) BAL: cytology, cell count with diff, routine bacterial culture, fungal culture, AFB culture 2) Transbronchial biopsy: routine histology   CT - 1. Fibrotic interstitial lung disease in a non UIP pattern with increased bilateral ground-glass opacities and new subtle centrilobular ground-glass nodules of the right upper lobe. Findings are likely due to superimposed infection, pulmonary edema, or acute exacerbation of ILD. Findings are suggestive of an alternative diagnosis (not UIP) per consensus guidelines: Diagnosis of Idiopathic Pulmonary Fibrosis: An Official ATS/ERS/JRS/ALAT Clinical Practice Guideline. [place], Iss 5, [id number], Apr 12 2017. 2. Severe left main and three-vessel coronary artery calcifications. 3. Aortic Atherosclerosis (ICD10-I70.0).      Disposition Plan  :    Status is: Inpatient   DVT Prophylaxis  :    Place TED hose Start: 09/11/22 0746 enoxaparin (LOVENOX) injection 40 mg Start: 09/10/22 2200    Lab Results  Component Value Date   PLT 263 09/13/2022    Diet :  Diet Order             Diet Heart Room service appropriate? Yes with Assist; Fluid consistency: Thin  Diet effective now                    Inpatient Medications  Scheduled Meds:  arformoterol  15 mcg Nebulization BID   aspirin EC  81 mg Oral Daily   azithromycin  500 mg Oral Daily   budesonide (PULMICORT) nebulizer solution  0.5 mg Nebulization BID   cholecalciferol  2,000 Units Oral Daily   enoxaparin (LOVENOX) injection  40 mg Subcutaneous Q24H   guaiFENesin  600 mg Oral BID   hydroxyurea  500 mg Oral Daily   methylPREDNISolone (SOLU-MEDROL) injection  40 mg Intravenous Daily  multivitamin  1 tablet  Oral Daily   pantoprazole  40 mg Oral BID   simvastatin  20 mg Oral Q breakfast   sodium chloride flush  3 mL Intravenous Q12H   Continuous Infusions:  cefTRIAXone (ROCEPHIN)  IV 2 g (09/12/22 0820)   PRN Meds:.acetaminophen **OR** acetaminophen, albuterol, guaiFENesin-dextromethorphan, ondansetron **OR** ondansetron (ZOFRAN) IV    Objective:   Vitals:   09/13/22 0349 09/13/22 0528 09/13/22 0718 09/13/22 0815  BP:  (!) 118/54 (!) 117/49   Pulse:  (!) 57 70   Resp:  19 19   Temp:  98 F (36.7 C) 98.2 F (36.8 C)   TempSrc:  Oral Oral   SpO2: 91% 96% 93% 93%  Weight:  68.2 kg    Height:        Wt Readings from Last 3 Encounters:  09/13/22 68.2 kg  08/27/22 73.3 kg  08/23/22 73.3 kg     Intake/Output Summary (Last 24 hours) at 09/13/2022 1012 Last data filed at 09/13/2022 0816 Gross per 24 hour  Intake 1497 ml  Output 2100 ml  Net -603 ml     Physical Exam  Awake Alert, No new F.N deficits, Normal affect [place].AT,PERRAL Supple Neck, No JVD,   Symmetrical Chest wall movement, Good air movement bilaterally, fine rales RRR,No Gallops,Rubs or new Murmurs,  +ve B.Sounds, Abd Soft, No tenderness,   trace leg edema   Data Review:    Recent Labs  Lab 09/10/22 0415 09/10/22 0439 09/11/22 0137 09/12/22 0024 09/13/22 0059  WBC 35.6*  --  34.7* 35.0* 28.6*  HGB 11.0* 11.9* 10.1* 11.1* 10.3*  HCT 36.1* 35.0* 33.3* 36.3* 33.8*  PLT 340  --  305 328 263  MCV 98.9  --  97.1 96.5 97.7  MCH 30.1  --  29.4 29.5 29.8  MCHC 30.5  --  30.3 30.6 30.5  RDW 23.9*  --  24.2* 24.2* 23.2*  LYMPHSABS 1.7  --  0.3* 0.7 1.4  MONOABS 3.6*  --  2.8* 2.5* 4.3*  EOSABS 0.1  --  0.0 0.0 0.0  BASOSABS 0.3*  --  0.0 0.0 0.0    Recent Labs  Lab 09/10/22 0414 09/10/22 0415 09/10/22 0439 09/10/22 0522 09/10/22 0840 09/11/22 0137 09/12/22 0024 09/13/22 0059  NA  --  141 140  --   --  138 136 137  K  --  3.4* 3.3*  --   --  4.3 3.7 4.5  CL  --  100  --   --   --  100 96* 94*  CO2   --  27  --   --   --  27 29 32  ANIONGAP  --  14  --   --   --  '11 11 11  '$ GLUCOSE  --  132*  --   --   --  136* 107* 139*  BUN  --  14  --   --   --  18 20 29*  CREATININE  --  0.80  --   --   --  0.73 1.07 0.95  AST  --   --   --   --   --   --  32 21  ALT  --   --   --   --   --   --  22 20  ALKPHOS  --   --   --   --   --   --  95 93  BILITOT  --   --   --   --   --   --  0.7 0.8  ALBUMIN  --   --   --   --   --   --  3.8 3.4*  CRP  --  1.5*  --   --   --   --  2.0* 3.9*  PROCALCITON  --   --   --   --  0.12  --  0.20 0.22  LATICACIDVEN  --   --   --  2.8* 2.9*  --   --   --   BNP 272.4*  --   --   --   --   --  174.5* 192.8*  MG  --   --   --   --   --   --  2.0 2.4  CALCIUM  --  9.3  --   --   --  9.6 9.6 9.3    Radiology Reports DG C-ARM BRONCHOSCOPY  Result Date: 09/12/2022 C-ARM BRONCHOSCOPY: Fluoroscopy was utilized by the requesting physician.  No radiographic interpretation.   [name] PORT 1 VIEW  Result Date: 09/12/2022 CLINICAL DATA:  Status post bronchoscopy. EXAM: PORTABLE CHEST 1 VIEW COMPARISON:  09/12/2022. FINDINGS: 1304 hours. Persistent low lung volumes with accentuation of the pulmonary vasculature. Improved aeration of the left lower lobe with persistent retrocardiac opacity. Similar bilateral hazy airspace opacities. No large pleural effusion or pneumothorax. IMPRESSION: Improved aeration of the left lower lobe with persistent retrocardiac opacity and similar bilateral hazy airspace opacities. Electronically Signed   By: Emmit Alexanders M.D.   On: 09/12/2022 13:31   [name] Port 1 View  Result Date: 09/12/2022 CLINICAL DATA:  Shortness of breath EXAM: PORTABLE CHEST 1 VIEW COMPARISON:  Yesterday FINDINGS: Bilateral airspace opacity which is progressed. Lung volumes are low. Partially obscured heart size which is unchanged. Asymmetric elevation of the right diaphragm. IMPRESSION: Low volume chest with worsening aeration, reference recent chest CT. Electronically  Signed   By: Jorje Guild M.D.   On: 09/12/2022 06:41   [name] Port 1 View  Result Date: 09/11/2022 CLINICAL DATA:  Shortness of breath. EXAM: PORTABLE CHEST 1 VIEW COMPARISON:  09/10/2022 FINDINGS: 0649 hours. Low volume film with stable asymmetric elevation right hemidiaphragm. The cardio pericardial silhouette is enlarged. Volume loss again noted left hemithorax. There is diffuse interstitial and patchy airspace disease in both lungs, most pronounced at the left base. The visualized bony structures of the thorax are unremarkable. Telemetry leads overlie the chest. IMPRESSION: Low volume film with diffuse interstitial and patchy airspace disease in both lungs, most pronounced at the left base. Imaging features compatible with interstitial lung disease/fibrosis as characterized on yesterday's high-resolution chest CT. Electronically Signed   By: Misty Stanley M.D.   On: 09/11/2022 06:55   CT Chest High Resolution  Result Date: 09/10/2022 CLINICAL DATA:  Interstitial lung disease EXAM: CT CHEST WITHOUT CONTRAST TECHNIQUE: Multidetector CT imaging of the chest was performed following the standard protocol without intravenous contrast. High resolution imaging of the lungs, as well as inspiratory and expiratory imaging, was performed. RADIATION DOSE REDUCTION: This exam was performed according to the departmental dose-optimization program which includes automated exposure control, adjustment of the mA and/or kV according to patient size and/or use of iterative reconstruction technique. COMPARISON:  Multiple priors, most recent CT dated July 20, 2022 FINDINGS: Cardiovascular: Normal heart size. Pericardial effusion. Normal caliber thoracic aorta with moderate calcified plaque. Severe left main and three-vessel coronary artery calcifications. Mediastinum/Nodes: Small hiatal hernia. Thyroid is unremarkable. Mildly enlarged mediastinal and hilar lymph nodes,  similar to prior exam and likely reactive.  Lungs/Pleura: Central airways are patent. Evidence of mild-to-moderate air trapping. Elevation of the right hemidiaphragm. Peribronchovascular mid and lower lung predominant reticular opacities with associated traction bronchiectasis, difficult to assess for progressive fibrotic changes given superimposed acute ground-glass opacities. No evidence of honeycomb change. Increased bilateral ground-glass opacities, most evident in the superior portion of the right lower lobe right upper lobe and posterior left upper lobe. New subtle centrilobular ground-glass nodules also seen in the right upper lobe. Dendriform pulmonary ossification of the lower lobes again seen. No pleural effusion or pneumothorax. Upper Abdomen: No acute abnormality. Musculoskeletal: No chest wall mass or suspicious bone lesions identified. IMPRESSION: 1. Fibrotic interstitial lung disease in a non UIP pattern with increased bilateral ground-glass opacities and new subtle centrilobular ground-glass nodules of the right upper lobe. Findings are likely due to superimposed infection, pulmonary edema, or acute exacerbation of ILD. Findings are suggestive of an alternative diagnosis (not UIP) per consensus guidelines: Diagnosis of Idiopathic Pulmonary Fibrosis: An Official ATS/ERS/JRS/ALAT Clinical Practice Guideline. [place], Iss 5, [id number], Apr 12 2017. 2. Severe left main and three-vessel coronary artery calcifications. 3. Aortic Atherosclerosis (ICD10-I70.0). Electronically Signed   By: Yetta Glassman M.D.   On: 09/10/2022 15:51   [name] Portable 1 View  Result Date: 09/10/2022 CLINICAL DATA:  Shortness of breath. EXAM: PORTABLE CHEST 1 VIEW COMPARISON:  07/20/2022. FINDINGS: The heart size and mediastinal contours are stable. There is atherosclerotic calcification of the aorta. Lung volumes are low. There is hazy airspace disease in the lungs bilaterally with consolidation at the left lung base. No definite effusion  or pneumothorax. Evidence of prior rotator cuff surgery is noted on the left. No acute osseous abnormality. IMPRESSION: Hazy airspace disease in the lungs bilaterally with consolidation at the left lung base, concerning for pneumonia. Electronically Signed   By: Brett Fairy M.D.   On: 09/10/2022 04:34      Signature  -   Lala Lund M.D on 09/13/2022 at 10:12 AM   -  To page go to [email or web address]

## 2022-09-13 NOTE — Plan of Care (Signed)

## 2022-09-13 NOTE — Progress Notes (Signed)
Occupational Therapy Treatment Patient Details Name: [name] MRN: [id number] DOB: 07-Apr-1941 Today's Date: 09/13/2022   History of present illness Pt is a 82 y/o M presenting to ED on 1/30 with SOB, admitted for acute on chronic respiratory failure with hypoxia due to ILD and CAP. PMH includes HTN, HLD, CAD, interstitial lung disease, chronic respiratory failure on 2L O2, myeloproliferative disorder with JAK mutation.   OT comments  Pt. Seen for skilled OT treatment session.  Focus of session on energy conservation and breathing strategies in conjunction with LB dressing task.  Longer break required after initial supine to sit before able to engage in removal of socks.  Pt. Motivated and receptive to tech.  Increased time spent on PLB and review of energy conservation.    Recommendations for follow up therapy are one component of a multi-disciplinary discharge planning process, led by the attending physician.  Recommendations may be updated based on patient status, additional functional criteria and insurance authorization.    Follow Up Recommendations  Home health OT     Assistance Recommended at Discharge Set up Supervision/Assistance  Patient can return home with the following  A little help with walking and/or transfers;A lot of help with bathing/dressing/bathroom;Assistance with cooking/housework;Direct supervision/assist for financial management;Direct supervision/assist for medications management;Assist for transportation;Help with stairs or ramp for entrance   Equipment Recommendations       Recommendations for Other Services      Precautions / Restrictions Precautions Precautions: Other (comment) Precaution Comments: watch O2 - 2LO2 chronically       Mobility Bed Mobility                    Transfers                         Balance                                           ADL either performed or assessed with clinical  judgement   ADL Overall ADL's : Needs assistance/impaired                     Lower Body Dressing: Minimal assistance;Sitting/lateral leans;Cueing for compensatory techniques Lower Body Dressing Details (indicate cue type and reason): increased time for task completion secondary to need for rest breaks and slower pace             Functional mobility during ADLs: Supervision/safety General ADL Comments: focused on energy conservation strategies and breathing techniques.  longer break required after sitting before being able to take sock off and then being able to put sock back on    Extremity/Trunk Assessment              Vision       Perception     Praxis      Cognition Arousal/Alertness: Awake/alert Behavior During Therapy: WFL for tasks assessed/performed Overall Cognitive Status: Within Functional Limits for tasks assessed                                          Exercises      Shoulder Instructions       General Comments Pt on 3.5L. SpO2 89% at rest. Amb in room on 3.5L  with desat to 78%. Seated rest with O2 increased to 6L for recovery to 86%. O2 then decreased to 4L. Improved to 90% after several more minutes of rest.    Pertinent Vitals/ Pain       Pain Assessment Pain Assessment: No/denies pain  Home Living                                          Prior Functioning/Environment              Frequency  Min 2X/week        Progress Toward Goals  OT Goals(current goals can now be found in the care plan section)  Progress towards OT goals: Progressing toward goals     Plan Discharge plan remains appropriate    Co-evaluation                 AM-PAC OT "6 Clicks" Daily Activity     Outcome Measure   Help from another person eating meals?: None Help from another person taking care of personal grooming?: None Help from another person toileting, which includes using toliet, bedpan, or  urinal?: A Little Help from another person bathing (including washing, rinsing, drying)?: A Lot Help from another person to put on and taking off regular upper body clothing?: None Help from another person to put on and taking off regular lower body clothing?: A Lot 6 Click Score: 19    End of Session Equipment Utilized During Treatment: Oxygen  OT Visit Diagnosis: Unsteadiness on feet (R26.81);Other abnormalities of gait and mobility (R26.89)   Activity Tolerance Patient tolerated treatment well   Patient Left in chair;with call bell/phone within reach   Nurse Communication          Time: [phone] OT Time Calculation (min): 44 min  Charges: OT General Charges $OT Visit: 1 Visit OT Treatments $Self Care/Home Management : 38-52 mins  Sonia Baller, COTA/L Acute Rehabilitation [phone]   Clearnce Sorrel Lorraine-COTA/L 09/13/2022, 12:16 PM

## 2022-09-13 NOTE — Care Management Important Message (Signed)
Important Message  Patient Details  Name: MANTAJ CHAMBERLIN MRN: [id number] Date of Birth: 04/08/1941   Medicare Important Message Given:  Yes     Shelda Altes 09/13/2022, 10:28 AM

## 2022-09-13 NOTE — Progress Notes (Signed)
Physical Therapy Treatment Patient Details Name: [name] MRN: [id number] DOB: [birth year] Today's Date: 09/13/2022   History of Present Illness Pt is a 82 y/o M presenting to ED on 1/30 with SOB, admitted for acute on chronic respiratory failure with hypoxia due to ILD and CAP. PMH includes HTN, HLD, CAD, interstitial lung disease, chronic respiratory failure on 2L O2, myeloproliferative disorder with JAK mutation.    PT Comments    Pt received in recliner. SpO2 89% at rest on 3.5L. Amb in room 50' supervision on 3.5L with desat to 78%. Pt returned to recliner and O2 increased to 6L. 3-4 minute recovery time to improve to 86%. O2 then decreased to 4L. After 2-3 more minutes of rest, SpO2 increased to 90%. Pt instructed in LE exercises to perform while sitting up. Pt remained in recliner at end of session.    Recommendations for follow up therapy are one component of a multi-disciplinary discharge planning process, led by the attending physician.  Recommendations may be updated based on patient status, additional functional criteria and insurance authorization.  Follow Up Recommendations  No PT follow up     Assistance Recommended at Discharge PRN  Patient can return home with the following     Equipment Recommendations  None recommended by PT    Recommendations for Other Services       Precautions / Restrictions Precautions Precautions: Other (comment) Precaution Comments: watch O2 - 2LO2 chronically     Mobility  Bed Mobility               General bed mobility comments: up in chair    Transfers Overall transfer level: Needs assistance Equipment used: None Transfers: Sit to/from Stand Sit to Stand: Supervision           General transfer comment: for safety, lines/leads    Ambulation/Gait Ambulation/Gait assistance: Supervision Gait Distance (Feet): 50 Feet Assistive device: None Gait Pattern/deviations: Step-through pattern, Decreased stride  length Gait velocity: decreased     General Gait Details: SpO2 89% at rest on 3.5L. Mobilized on 3.5L with desat to 78%. O2 increased to 6L during seated rest break. 3-4 minute recovery time to 86%. O2 then decreased to 4L. After 2-3 more minutes of rest, SpO2 90%.   Stairs             Wheelchair Mobility    Modified Rankin (Stroke Patients Only)       Balance Overall balance assessment: No apparent balance deficits (not formally assessed)                                          Cognition Arousal/Alertness: Awake/alert Behavior During Therapy: WFL for tasks assessed/performed Overall Cognitive Status: Within Functional Limits for tasks assessed                                          Exercises General Exercises - Lower Extremity Ankle Circles/Pumps: AROM, Both, Seated Long Arc Quad: AROM, Right, Left, Seated    General Comments General comments (skin integrity, edema, etc.): Pt on 3.5L. SpO2 89% at rest. Amb in room on 3.5L with desat to 78%. Seated rest with O2 increased to 6L for recovery to 86%. O2 then decreased to 4L. Improved to 90% after several more minutes of rest.  Pertinent Vitals/Pain Pain Assessment Pain Assessment: No/denies pain    Home Living                          Prior Function            PT Goals (current goals can now be found in the care plan section) Acute Rehab PT Goals Patient Stated Goal: home Progress towards PT goals: Progressing toward goals    Frequency    Min 3X/week      PT Plan Current plan remains appropriate    Co-evaluation              AM-PAC PT "6 Clicks" Mobility   Outcome Measure  Help needed turning from your back to your side while in a flat bed without using bedrails?: None Help needed moving from lying on your back to sitting on the side of a flat bed without using bedrails?: None Help needed moving to and from a bed to a chair (including a  wheelchair)?: None Help needed standing up from a chair using your arms (e.g., wheelchair or bedside chair)?: None Help needed to walk in hospital room?: A Little Help needed climbing 3-5 steps with a railing? : A Little 6 Click Score: 22    End of Session Equipment Utilized During Treatment: Oxygen Activity Tolerance: Treatment limited secondary to medical complications (Comment) (desat) Patient left: in chair;with call bell/phone within reach Nurse Communication: Mobility status PT Visit Diagnosis: Other abnormalities of gait and mobility (R26.89)     Time: 6568-1275 PT Time Calculation (min) (ACUTE ONLY): 15 min  Charges:  $Gait Training: 8-22 mins                     Gloriann Loan., PT  Office # [phone]    Lorriane Shire 09/13/2022, 11:48 AM

## 2022-09-14 DIAGNOSIS — J9621 Acute and chronic respiratory failure with hypoxia: Secondary | ICD-10-CM | POA: Diagnosis not present

## 2022-09-14 LAB — COMPREHENSIVE METABOLIC PANEL
ALT: 23 U/L (ref 0–44)
AST: 22 U/L (ref 15–41)
Albumin: 3.5 g/dL (ref 3.5–5.0)
Alkaline Phosphatase: 81 U/L (ref 38–126)
Anion gap: 12 (ref 5–15)
BUN: 26 mg/dL — ABNORMAL HIGH (ref 8–23)
CO2: 30 mmol/L (ref 22–32)
Calcium: 9 mg/dL (ref 8.9–10.3)
Chloride: 94 mmol/L — ABNORMAL LOW (ref 98–111)
Creatinine, Ser: 0.96 mg/dL (ref 0.61–1.24)
GFR, Estimated: >60 mL/min (ref >60)
Glucose, Bld: 70 mg/dL (ref 70–99)
Potassium: 3.9 mmol/L (ref 3.5–5.1)
Sodium: 136 mmol/L (ref 135–145)
Total Bilirubin: 0.5 mg/dL (ref 0.3–1.2)
Total Protein: 6.6 g/dL (ref 6.5–8.1)

## 2022-09-14 LAB — CBC WITH DIFFERENTIAL/PLATELET
Abs Immature Granulocytes: 2 10*3/uL — ABNORMAL HIGH (ref 0.00–0.07)
Basophils Absolute: 0 10*3/uL (ref 0.0–0.1)
Basophils Relative: 0 %
Eosinophils Absolute: 0 10*3/uL (ref 0.0–0.5)
Eosinophils Relative: 0 %
HCT: 34.5 % — ABNORMAL LOW (ref 39.0–52.0)
Hemoglobin: 10.4 g/dL — ABNORMAL LOW (ref 13.0–17.0)
Lymphocytes Relative: 4 %
Lymphs Abs: 1 10*3/uL (ref 0.7–4.0)
MCH: 29.9 pg (ref 26.0–34.0)
MCHC: 30.1 g/dL (ref 30.0–36.0)
MCV: 99.1 fL (ref 80.0–100.0)
Metamyelocytes Relative: 4 %
Monocytes Absolute: 1.8 10*3/uL — ABNORMAL HIGH (ref 0.1–1.0)
Monocytes Relative: 7 %
Myelocytes: 4 %
Neutro Abs: 20.3 10*3/uL — ABNORMAL HIGH (ref 1.7–7.7)
Neutrophils Relative %: 81 %
Platelets: 249 10*3/uL (ref 150–400)
RBC: 3.48 MIL/uL — ABNORMAL LOW (ref 4.22–5.81)
RDW: 23.4 % — ABNORMAL HIGH (ref 11.5–15.5)
WBC: 25 10*3/uL — ABNORMAL HIGH (ref 4.0–10.5)
nRBC: 0.2 % (ref 0.0–0.2)
nRBC: 1 /100 WBC — ABNORMAL HIGH

## 2022-09-14 LAB — C-REACTIVE PROTEIN: CRP: 1.8 mg/dL — ABNORMAL HIGH (ref <1.0)

## 2022-09-14 LAB — ACID FAST SMEAR (AFB, MYCOBACTERIA): Acid Fast Smear: NEGATIVE

## 2022-09-14 LAB — BRAIN NATRIURETIC PEPTIDE: B Natriuretic Peptide: 88.1 pg/mL (ref 0.0–100.0)

## 2022-09-14 LAB — MAGNESIUM: Magnesium: 2.2 mg/dL (ref 1.7–2.4)

## 2022-09-14 LAB — PROCALCITONIN: Procalcitonin: 0.2 ng/mL

## 2022-09-14 MED ORDER — FUROSEMIDE 40 MG PO TABS: 40.0000 mg | ORAL_TABLET | Freq: Every day | ORAL | 0 refills | Status: AC

## 2022-09-14 MED ORDER — IPRATROPIUM-ALBUTEROL 0.5-2.5 (3) MG/3ML IN SOLN: RESPIRATORY_TRACT | 0 refills | Status: AC

## 2022-09-14 MED ORDER — POTASSIUM CHLORIDE ER 10 MEQ PO TBCR: 10.0000 meq | EXTENDED_RELEASE_TABLET | Freq: Every day | ORAL | 0 refills | Status: AC

## 2022-09-14 MED ORDER — ALBUTEROL SULFATE HFA 108 (90 BASE) MCG/ACT IN AERS: 2.0000 | INHALATION_SPRAY | Freq: Four times a day (QID) | RESPIRATORY_TRACT | 0 refills | Status: AC | PRN

## 2022-09-14 MED ORDER — POTASSIUM CHLORIDE CRYS ER 20 MEQ PO TBCR
20.0000 meq | EXTENDED_RELEASE_TABLET | Freq: Once | ORAL | Status: AC
  Administered 2022-09-14: 20 meq via ORAL
  Filled 2022-09-14: qty 1

## 2022-09-14 MED ORDER — AZITHROMYCIN 500 MG PO TABS: 500.0000 mg | ORAL_TABLET | Freq: Every day | ORAL | 0 refills | Status: DC

## 2022-09-14 MED ORDER — FUROSEMIDE 10 MG/ML IJ SOLN
20.0000 mg | Freq: Once | INTRAMUSCULAR | Status: AC
  Administered 2022-09-14: 20 mg via INTRAVENOUS
  Filled 2022-09-14: qty 2

## 2022-09-14 NOTE — Discharge Summary (Signed)
ONOFRE GAINS HYI:[id number] DOB: 1941-04-08 DOA: 09/10/2022  PCP: [name], MD  Admit date: 09/10/2022  Discharge date: 09/14/2022  Admitted From: Home   Disposition:  Home   Recommendations for Outpatient Follow-up:   Follow up with PCP in 1-2 weeks  PCP Please obtain BMP/CBC, 2 view CXR in 1week,  (see Discharge instructions)   PCP Please follow up on the following pending results: Please monitor BMP, diuretic dose closely, needs close outpatient follow-up with his pulmonologist.  Follow-up on biopsies obtained during bronchoscopy.   Home Health: None   Equipment/Devices: Neb machine, Home o2  Consultations: PCCM Discharge Condition: Stable    CODE STATUS: Full    Diet Recommendation: Heart Healthy with 1.5 L fluid restriction per day  Chief Complaint  Patient presents with   Shortness of Breath     Brief history of present illness from the day of admission and additional interim summary    82 y.o. male with medical history significant of hypertension, hyperlipidemia, CAD, interstitial lung disease, chronic respiratory failure with hypoxia on 2 L as needed, and myeloproliferative disorder with JAK mutation followed by Dr. Earlie Server who presents with complaints of shortness of breath.  Patient had been hospitalized 07/20/2022-07/23/2022 with acute respiratory failure with hypoxia secondary to interstitial lung disease he had been placed on prophylactic Bactrim due to for concern of  PCP given patient being exposed to bird droppings, stabilized and discharged home on 2 L of oxygen.  He has since gotten rid of his birds, subsequently started on a prednisone taper however he continued to develop progressively worse shortness of breath and was admitted to the hospital.                                                                   Hospital Course   Acute on chronic hypoxic respiratory failure.  Recent diagnosis of possible ILD and now diagnosed with CAP.  He had recent hospitalization with similar problems, pulmonary critical care is following the patient, he is currently on empiric IV antibiotics to cover for CAP however he does give significant history of weight gain, lower extremity edema and significant orthopnea.    He received trial of IV Lasix for several days with over 4.5 L of fluid removed with significant improvement in his orthopnea, lower extremity swelling and shortness of breath, he came in on 6 L nasal cannula oxygen now down to 2 L of oxygen at rest, upon ambulation he may require between 4 to 5 L of oxygen hence his home oxygen settings and orders have been changed, he is feeling a whole lot better, will be discharged home on low-dose oral diuretic, continue home prednisone taper as he was being given by his pulmonary physician.  He was seen by pulmonary here underwent bronchoscopy  with biopsy results pending which he will follow-up with PCP and his pulmonologist.    Upon discharge he is also getting nebulizer treatments upon his request along with nebulizer machine, overall he is much improved at least in the short-term will defer further management to PCP and his primary pulmonologist.  Request PCP to monitor his diuretic dose, BMP closely along with magnesium levels.        Acute on chronic diastolic CHF.  EF 65% on recent echocardiogram done few weeks ago.  As above.   Recent diagnosis of ILD.  CT noted seen by pulmonary underwent bronchoscopy with biopsies, will follow-up with his pulmonologist for biopsy results within a week along with his PCP.  Case discussed with Dr. Valeta Harms pulmonary physician no change in home medications stable for discharge from my standpoint.    Right upper lobe lung nodule.  Noted on CT scan.  Follow-up with primary pulmonologist [name]  postdischarge.   CAD.  On aspirin and statin for secondary prevention.  No acute issues.  Recent echo noted and stable with preserved EF and chronic diastolic CHF.   Essential hypertension.  Continue home regimen.   Dyslipidemia.  On statin.   Hypokalemia.  Replaced.   History of myeloproliferative disorder with chronic leukocytosis.  Has Jak mutation and follows with Dr. [name] hematology oncology, continue hydroxyurea while in the hospital there after follow-up with his primary oncologist.   Chronic normocytic anemia.  Due to above.  Monitor.    Discharge diagnosis     Principal Problem:   Acute on chronic respiratory failure with hypoxia (HCC) Active Problems:   Sepsis due to pneumonia ([place])   ILD (interstitial lung disease) (HCC)   Hypokalemia   Myeloproliferative disorder (HCC)   Normocytic anemia   Essential hypertension   CAD (coronary artery disease)   HYPERCHOLESTEROLEMIA    Discharge instructions    Discharge Instructions     Discharge instructions   Complete by: As directed    Follow with Primary MD [name], MD in 7 days   Get CBC, CMP, Magnesium, 2 view Chest X ray -  checked next visit with your primary MD   Activity: As tolerated with Full fall precautions use walker/cane & assistance as needed  Disposition Home    Diet: Heart Healthy -  Check your Weight same time everyday, if you gain over 2 pounds, or you develop in leg swelling, experience more shortness of breath or chest pain, call your Primary MD immediately. Follow Cardiac Low Salt Diet and 1.5 lit/day fluid restriction.  Special Instructions: If you have smoked or chewed Tobacco  in the last 2 yrs please stop smoking, stop any regular Alcohol  and or any Recreational drug use.  On your next visit with your primary care physician please Get Medicines reviewed and adjusted.  Please request your Prim.MD to go over all Hospital Tests and Procedure/Radiological results at the follow up,  please get all Hospital records sent to your Prim MD by signing hospital release before you go home.  If you experience worsening of your admission symptoms, develop shortness of breath, life threatening emergency, suicidal or homicidal thoughts you must seek medical attention immediately by calling 911 or calling your MD immediately  if symptoms less severe.  You Must read complete instructions/literature along with all the possible adverse reactions/side effects for all the Medicines you take and that have been prescribed to you. Take any new Medicines after you have completely understood and accpet all the  possible adverse reactions/side effects.   Increase activity slowly   Complete by: As directed        Discharge Medications   Allergies as of 09/14/2022       Reactions   Celecoxib Other (See Comments)   Pt states precipitated his heart attack.   Tramadol Other (See Comments)   Makes patient jumpy        Medication List     TAKE these medications    acetaminophen 325 MG tablet Commonly known as: TYLENOL Take 650 mg by mouth every 6 (six) hours as needed for mild pain or moderate pain.   albuterol 108 (90 Base) MCG/ACT inhaler Commonly known as: VENTOLIN HFA Inhale 2 puffs into the lungs every 6 (six) hours as needed for wheezing or shortness of breath.   amLODipine 2.5 MG tablet Commonly known as: NORVASC Take 1 tablet (2.5 mg total) by mouth daily.   aspirin EC 81 MG tablet Take 81 mg by mouth daily.   azithromycin 500 MG tablet Commonly known as: ZITHROMAX Take 1 tablet (500 mg total) by mouth daily.   budesonide-formoterol 80-4.5 MCG/ACT inhaler Commonly known as: SYMBICORT Inhale 2 puffs into the lungs 2 (two) times daily.   famotidine 20 MG tablet Commonly known as: PEPCID Take 20 mg by mouth daily.   furosemide 40 MG tablet Commonly known as: Lasix Take 1 tablet (40 mg total) by mouth daily for 15 days.   hydroxyurea 500 MG capsule Commonly known  as: HYDREA Take 1 capsule (500 mg total) by mouth daily. May take with food to minimize GI side effects.   ICaps Areds 2 Caps Take 1 capsule by mouth 2 (two) times daily.   ipratropium-albuterol 0.5-2.5 (3) MG/3ML Soln Commonly known as: DUONEB Use twice a day scheduled and every 4 hours as needed for shortness of breath and wheezing   oxymetazoline 0.05 % nasal spray Commonly known as: AFRIN Place 1 spray into both nostrils daily.   pantoprazole 40 MG tablet Commonly known as: PROTONIX Take 1 tablet (40 mg total) by mouth 2 (two) times daily.   potassium chloride 10 MEQ tablet Commonly known as: KLOR-CON Take 1 tablet (10 mEq total) by mouth daily.   predniSONE 10 MG tablet Commonly known as: DELTASONE Take 4 tablets (40 mg total) by mouth daily with breakfast for 7 days, THEN 3 tablets (30 mg total) daily with breakfast for 7 days, THEN 2 tablets (20 mg total) daily with breakfast for 7 days. Then continue on '10mg'$  daily until further notice.. Start taking on: September 06, 2022   PREVAGEN PO Take 1 tablet by mouth daily.   simvastatin 20 MG tablet Commonly known as: ZOCOR TAKE 1 TABLET EVERY DAY WITH BREAKFAST (APPOINTMENT IS NEEDED FOR REFILLS) What changed:  how much to take how to take this when to take this   [facility]opher Med Name: CPAP per Dr Dannial Monarch TO FIND Take 6 capsules by mouth daily. Med Name: Balance of [employer] and Balance of [employer]. 3 capsules each by mouth once daily   Vitamin D3 1.25 MG (50000 UT) Caps Take 1 capsule by mouth daily. Pt does not know dose.               Durable Medical Equipment  (From admission, onward)           Start     Ordered   09/14/22 1028  For home use only DME Nebulizer/meds  Once  Question Answer Comment  Patient needs a nebulizer to treat with the following condition COPD (chronic obstructive pulmonary disease) ([place])   Length of Need Lifetime      09/14/22 1027   09/14/22 1027   For home use only DME Nebulizer machine  Once       Question Answer Comment  Patient needs a nebulizer to treat with the following condition COPD (chronic obstructive pulmonary disease) ([place])   Length of Need Lifetime      09/14/22 1026   09/13/22 1013  For home use only DME oxygen  Once       Question Answer Comment  Length of Need 6 Months   Mode or (Route) Nasal cannula   Liters per Minute 5   Frequency Continuous (stationary and portable oxygen unit needed)   Oxygen conserving device Yes   Oxygen delivery system Gas      09/13/22 1012             Follow-up Information     [name], MD. Schedule an appointment as soon as possible for a visit in 1 week(s).   Specialties: Family Medicine, Sports Medicine Contact information: [place] [address] [phone]         Brand Males, MD. Schedule an appointment as soon as possible for a visit in 1 week(s).   Specialty: Pulmonary Disease Contact information: [place]  [address] [phone]                 Major procedures and Radiology Reports - PLEASE review detailed and final reports thoroughly  -      DG C-ARM BRONCHOSCOPY  Result Date: 09/12/2022 C-ARM BRONCHOSCOPY: Fluoroscopy was utilized by the requesting physician.  No radiographic interpretation.   [name] PORT 1 VIEW  Result Date: 09/12/2022 CLINICAL DATA:  Status post bronchoscopy. EXAM: PORTABLE CHEST 1 VIEW COMPARISON:  09/12/2022. FINDINGS: 1304 hours. Persistent low lung volumes with accentuation of the pulmonary vasculature. Improved aeration of the left lower lobe with persistent retrocardiac opacity. Similar bilateral hazy airspace opacities. No large pleural effusion or pneumothorax. IMPRESSION: Improved aeration of the left lower lobe with persistent retrocardiac opacity and similar bilateral hazy airspace opacities. Electronically Signed   By: Emmit Alexanders M.D.   On: 09/12/2022  13:31   [name] Port 1 View  Result Date: 09/12/2022 CLINICAL DATA:  Shortness of breath EXAM: PORTABLE CHEST 1 VIEW COMPARISON:  Yesterday FINDINGS: Bilateral airspace opacity which is progressed. Lung volumes are low. Partially obscured heart size which is unchanged. Asymmetric elevation of the right diaphragm. IMPRESSION: Low volume chest with worsening aeration, reference recent chest CT. Electronically Signed   By: Jorje Guild M.D.   On: 09/12/2022 06:41   [name] Port 1 View  Result Date: 09/11/2022 CLINICAL DATA:  Shortness of breath. EXAM: PORTABLE CHEST 1 VIEW COMPARISON:  09/10/2022 FINDINGS: 0649 hours. Low volume film with stable asymmetric elevation right hemidiaphragm. The cardio pericardial silhouette is enlarged. Volume loss again noted left hemithorax. There is diffuse interstitial and patchy airspace disease in both lungs, most pronounced at the left base. The visualized bony structures of the thorax are unremarkable. Telemetry leads overlie the chest. IMPRESSION: Low volume film with diffuse interstitial and patchy airspace disease in both lungs, most pronounced at the left base. Imaging features compatible with interstitial lung disease/fibrosis as characterized on yesterday's high-resolution chest CT. Electronically Signed   By: Misty Stanley M.D.   On: 09/11/2022 06:55  CT Chest High Resolution  Result Date: 09/10/2022 CLINICAL DATA:  Interstitial lung disease EXAM: CT CHEST WITHOUT CONTRAST TECHNIQUE: Multidetector CT imaging of the chest was performed following the standard protocol without intravenous contrast. High resolution imaging of the lungs, as well as inspiratory and expiratory imaging, was performed. RADIATION DOSE REDUCTION: This exam was performed according to the departmental dose-optimization program which includes automated exposure control, adjustment of the mA and/or kV according to patient size and/or use of iterative reconstruction technique. COMPARISON:   Multiple priors, most recent CT dated July 20, 2022 FINDINGS: Cardiovascular: Normal heart size. Pericardial effusion. Normal caliber thoracic aorta with moderate calcified plaque. Severe left main and three-vessel coronary artery calcifications. Mediastinum/Nodes: Small hiatal hernia. Thyroid is unremarkable. Mildly enlarged mediastinal and hilar lymph nodes, similar to prior exam and likely reactive. Lungs/Pleura: Central airways are patent. Evidence of mild-to-moderate air trapping. Elevation of the right hemidiaphragm. Peribronchovascular mid and lower lung predominant reticular opacities with associated traction bronchiectasis, difficult to assess for progressive fibrotic changes given superimposed acute ground-glass opacities. No evidence of honeycomb change. Increased bilateral ground-glass opacities, most evident in the superior portion of the right lower lobe right upper lobe and posterior left upper lobe. New subtle centrilobular ground-glass nodules also seen in the right upper lobe. Dendriform pulmonary ossification of the lower lobes again seen. No pleural effusion or pneumothorax. Upper Abdomen: No acute abnormality. Musculoskeletal: No chest wall mass or suspicious bone lesions identified. IMPRESSION: 1. Fibrotic interstitial lung disease in a non UIP pattern with increased bilateral ground-glass opacities and new subtle centrilobular ground-glass nodules of the right upper lobe. Findings are likely due to superimposed infection, pulmonary edema, or acute exacerbation of ILD. Findings are suggestive of an alternative diagnosis (not UIP) per consensus guidelines: Diagnosis of Idiopathic Pulmonary Fibrosis: An Official ATS/ERS/JRS/ALAT Clinical Practice Guideline. [place], Iss 5, [id number], Apr 12 2017. 2. Severe left main and three-vessel coronary artery calcifications. 3. Aortic Atherosclerosis (ICD10-I70.0). Electronically Signed   By: Yetta Glassman M.D.   On:  09/10/2022 15:51   [name] Portable 1 View  Result Date: 09/10/2022 CLINICAL DATA:  Shortness of breath. EXAM: PORTABLE CHEST 1 VIEW COMPARISON:  07/20/2022. FINDINGS: The heart size and mediastinal contours are stable. There is atherosclerotic calcification of the aorta. Lung volumes are low. There is hazy airspace disease in the lungs bilaterally with consolidation at the left lung base. No definite effusion or pneumothorax. Evidence of prior rotator cuff surgery is noted on the left. No acute osseous abnormality. IMPRESSION: Hazy airspace disease in the lungs bilaterally with consolidation at the left lung base, concerning for pneumonia. Electronically Signed   By: Brett Fairy M.D.   On: 09/10/2022 04:34    Micro Results    Recent Results (from the past 240 hour(s))  Resp panel by RT-PCR (RSV, Flu A&B, Covid) Anterior Nasal Swab     Status: None   Collection Time: 09/10/22  4:15 AM   Specimen: Anterior Nasal Swab  Result Value Ref Range Status   SARS Coronavirus 2 by RT PCR NEGATIVE NEGATIVE Final    Comment: (NOTE) SARS-CoV-2 target nucleic acids are NOT DETECTED.  The SARS-CoV-2 RNA is generally detectable in upper respiratory specimens during the acute phase of infection. The lowest concentration of SARS-CoV-2 viral copies this assay can detect is 138 copies/mL. A negative result does not preclude SARS-Cov-2 infection and should not be used as the sole basis for treatment or other patient management decisions. A negative  result may occur with  improper specimen collection/handling, submission of specimen other than nasopharyngeal swab, presence of viral mutation(s) within the areas targeted by this assay, and inadequate number of viral copies(<138 copies/mL). A negative result must be combined with clinical observations, patient history, and epidemiological information. The expected result is Negative.  Fact Sheet for Patients:   EntrepreneurPulse.com.au  Fact Sheet for Healthcare Providers:  IncredibleEmployment.be  This test is no t yet approved or cleared by the [place] FDA and  has been authorized for detection and/or diagnosis of SARS-CoV-2 by FDA under an Emergency Use Authorization (EUA). This EUA will remain  in effect (meaning this test can be used) for the duration of the COVID-19 declaration under Section 564(b)(1) of the Act, 21 U.S.C.section 360bbb-3(b)(1), unless the authorization is terminated  or revoked sooner.       Influenza A by PCR NEGATIVE NEGATIVE Final   Influenza B by PCR NEGATIVE NEGATIVE Final    Comment: (NOTE) The Xpert Xpress SARS-CoV-2/FLU/RSV plus assay is intended as an aid in the diagnosis of influenza from Nasopharyngeal swab specimens and should not be used as a sole basis for treatment. Nasal washings and aspirates are unacceptable for Xpert Xpress SARS-CoV-2/FLU/RSV testing.  Fact Sheet for Patients: EntrepreneurPulse.com.au  Fact Sheet for Healthcare Providers: IncredibleEmployment.be  This test is not yet approved or cleared by the [place] FDA and has been authorized for detection and/or diagnosis of SARS-CoV-2 by FDA under an Emergency Use Authorization (EUA). This EUA will remain in effect (meaning this test can be used) for the duration of the COVID-19 declaration under Section 564(b)(1) of the Act, 21 U.S.C. section 360bbb-3(b)(1), unless the authorization is terminated or revoked.     Resp Syncytial Virus by PCR NEGATIVE NEGATIVE Final    Comment: (NOTE) Fact Sheet for Patients: EntrepreneurPulse.com.au  Fact Sheet for Healthcare Providers: IncredibleEmployment.be  This test is not yet approved or cleared by the [place] FDA and has been authorized for detection and/or diagnosis of SARS-CoV-2 by FDA under an Emergency Use  Authorization (EUA). This EUA will remain in effect (meaning this test can be used) for the duration of the COVID-19 declaration under Section 564(b)(1) of the Act, 21 U.S.C. section 360bbb-3(b)(1), unless the authorization is terminated or revoked.  Performed at [facility], [place] [address], [place], [place] [address]   Blood culture (routine x 2)     Status: None (Preliminary result)   Collection Time: 09/10/22  5:48 AM   Specimen: BLOOD  Result Value Ref Range Status   Specimen Description BLOOD BLOOD RIGHT ARM  Final   Special Requests   Final    BOTTLES DRAWN AEROBIC AND ANAEROBIC Blood Culture results may not be optimal due to an excessive volume of blood received in culture bottles   Culture   Final    NO GROWTH 4 DAYS Performed at [facility], [place] [address], [place], [place] [address]    Report Status PENDING  Incomplete  Blood culture (routine x 2)     Status: None (Preliminary result)   Collection Time: 09/10/22  5:48 AM   Specimen: BLOOD  Result Value Ref Range Status   Specimen Description BLOOD BLOOD LEFT HAND  Final   Special Requests   Final    BOTTLES DRAWN AEROBIC AND ANAEROBIC Blood Culture results may not be optimal due to an excessive volume of blood received in culture bottles   Culture   Final    NO GROWTH 4 DAYS  Performed at [facility], [place] [address] [place], [place] [address]    Report Status PENDING  Incomplete  MRSA Next Gen by PCR, Nasal     Status: None   Collection Time: 09/10/22  7:28 AM   Specimen: Nasal Mucosa; Nasal Swab  Result Value Ref Range Status   MRSA by PCR Next Gen NOT DETECTED NOT DETECTED Final    Comment: (NOTE) The GeneXpert MRSA Assay (FDA approved for NASAL specimens only), is one component of a comprehensive MRSA colonization surveillance program. It is not intended to diagnose MRSA infection nor to guide or monitor treatment for MRSA infections. Test performance is not FDA approved in patients less  than 24 years old. Performed at [facility], [place] [address], [place], [place] [address]   Expectorated Sputum Assessment w Gram Stain, Rflx to Resp Cult     Status: None   Collection Time: 09/11/22  5:56 AM   Specimen: Expectorated Sputum  Result Value Ref Range Status   Specimen Description EXPECTORATED SPUTUM  Final   Special Requests Immunocompromised  Final   Sputum evaluation   Final    THIS SPECIMEN IS ACCEPTABLE FOR SPUTUM CULTURE Performed at [facility], [place] [address], [place], [place] [address]    Report Status 09/11/2022 FINAL  Final  Culture, Respiratory w Gram Stain     Status: None   Collection Time: 09/11/22  5:56 AM  Result Value Ref Range Status   Specimen Description EXPECTORATED SPUTUM  Final   Special Requests Immunocompromised Reflexed from [id number]  Final   Gram Stain   Final    FEW WBC PRESENT, PREDOMINANTLY PMN FEW GRAM POSITIVE COCCI FEW GRAM NEGATIVE RODS    Culture   Final    MODERATE Normal respiratory flora-no Staph aureus or Pseudomonas seen Performed at [facility], [address], [place], [place] [address]    Report Status 09/13/2022 FINAL  Final  Respiratory (~20 pathogens) panel by PCR     Status: None   Collection Time: 09/11/22  1:58 PM   Specimen: Nasopharyngeal Swab; Respiratory  Result Value Ref Range Status   Adenovirus NOT DETECTED NOT DETECTED Final   Coronavirus 229E NOT DETECTED NOT DETECTED Final    Comment: (NOTE) The Coronavirus on the Respiratory Panel, DOES NOT test for the novel  Coronavirus (2019 nCoV)    Coronavirus HKU1 NOT DETECTED NOT DETECTED Final   Coronavirus NL63 NOT DETECTED NOT DETECTED Final   Coronavirus OC43 NOT DETECTED NOT DETECTED Final   Metapneumovirus NOT DETECTED NOT DETECTED Final   Rhinovirus / Enterovirus NOT DETECTED NOT DETECTED Final   Influenza A NOT DETECTED NOT DETECTED Final   Influenza B NOT DETECTED NOT DETECTED Final   Parainfluenza Virus 1 NOT DETECTED NOT DETECTED  Final   Parainfluenza Virus 2 NOT DETECTED NOT DETECTED Final   Parainfluenza Virus 3 NOT DETECTED NOT DETECTED Final   Parainfluenza Virus 4 NOT DETECTED NOT DETECTED Final   Respiratory Syncytial Virus NOT DETECTED NOT DETECTED Final   Bordetella pertussis NOT DETECTED NOT DETECTED Final   Bordetella Parapertussis NOT DETECTED NOT DETECTED Final   Chlamydophila pneumoniae NOT DETECTED NOT DETECTED Final   Mycoplasma pneumoniae NOT DETECTED NOT DETECTED Final    Comment: Performed at [facility], [place] [address] [place],  [address]  MRSA Next Gen by PCR, Nasal     Status: None   Collection Time: 09/11/22  3:36 PM   Specimen: Nasal Mucosa;  Nasal Swab  Result Value Ref Range Status   MRSA by PCR Next Gen NOT DETECTED NOT DETECTED Final    Comment: (NOTE) The GeneXpert MRSA Assay (FDA approved for NASAL specimens only), is one component of a comprehensive MRSA colonization surveillance program. It is not intended to diagnose MRSA infection nor to guide or monitor treatment for MRSA infections. Test performance is not FDA approved in patients less than 91 years old. Performed at [facility], [place] [address] [place], [place] [address]   Aerobic/Anaerobic Culture w Gram Stain (surgical/deep wound)     Status: None (Preliminary result)   Collection Time: 09/12/22 12:37 PM   Specimen: Bronchial Alveolar Lavage; Respiratory  Result Value Ref Range Status   Specimen Description BRONCHIAL ALVEOLAR LAVAGE LEFT UPPER LUNG  Final   Special Requests NONE  Final   Gram Stain   Final    FEW WBC PRESENT,BOTH PMN AND MONONUCLEAR NO ORGANISMS SEEN    Culture   Final    NO GROWTH < 24 HOURS Performed at [facility], [place] [address], [place], [place] [address]    Report Status PENDING  Incomplete    Today   Subjective    Isael Stille today has no headache,no chest abdominal pain,no new weakness tingling or numbness, feels much better wants to go home today.     Objective   Blood pressure (!) 101/54, pulse (!) 57, temperature (!) 97.4 F (36.3 C), temperature source Oral, resp. rate 18, height '5\' 7"'$  (1.702 m), weight 68.7 kg, SpO2 91 %.   Intake/Output Summary (Last 24 hours) at 09/14/2022 1031 Last data filed at 09/14/2022 0834 Gross per 24 hour  Intake 934 ml  Output 2400 ml  Net -1466 ml    Exam  Awake Alert, No new F.N deficits,    [place].AT,PERRAL Supple Neck,   Symmetrical Chest wall movement, Good air movement bilaterally, fine crackles RRR,No Gallops,   +ve B.Sounds, Abd Soft, Non tender,  No Cyanosis, Clubbing or edema    Data Review   Recent Labs  Lab 09/10/22 0415 09/10/22 0439 09/11/22 0137 09/12/22 0024 09/13/22 0059 09/14/22 0038  WBC 35.6*  --  34.7* 35.0* 28.6* 25.0*  HGB 11.0* 11.9* 10.1* 11.1* 10.3* 10.4*  HCT 36.1* 35.0* 33.3* 36.3* 33.8* 34.5*  PLT 340  --  305 328 263 249  MCV 98.9  --  97.1 96.5 97.7 99.1  MCH 30.1  --  29.4 29.5 29.8 29.9  MCHC 30.5  --  30.3 30.6 30.5 30.1  RDW 23.9*  --  24.2* 24.2* 23.2* 23.4*  LYMPHSABS 1.7  --  0.3* 0.7 1.4 1.0  MONOABS 3.6*  --  2.8* 2.5* 4.3* 1.8*  EOSABS 0.1  --  0.0 0.0 0.0 0.0  BASOSABS 0.3*  --  0.0 0.0 0.0 0.0    Recent Labs  Lab 09/10/22 0414 09/10/22 0415 09/10/22 0415 09/10/22 0439 09/10/22 0522 09/10/22 0840 09/11/22 0137 09/12/22 0024 09/13/22 0059 09/14/22 0038  NA  --  141   < > 140  --   --  138 136 137 136  K  --  3.4*   < > 3.3*  --   --  4.3 3.7 4.5 3.9  CL  --  100  --   --   --   --  100 96* 94* 94*  CO2  --  27  --   --   --   --  27 29 32 30  ANIONGAP  --  14  --   --   --   --  '11 11 11 12  '$ GLUCOSE  --  132*  --   --   --   --  136* 107* 139* 70  BUN  --  14  --   --   --   --  18 20 29* 26*  CREATININE  --  0.80  --   --   --   --  0.73 1.07 0.95 0.96  AST  --   --   --   --   --   --   --  32 21 22  ALT  --   --   --   --   --   --   --  '22 20 23  '$ ALKPHOS  --   --   --   --   --   --   --  95 93 81  BILITOT  --   --   --    --   --   --   --  0.7 0.8 0.5  ALBUMIN  --   --   --   --   --   --   --  3.8 3.4* 3.5  CRP  --  1.5*  --   --   --   --   --  2.0* 3.9* 1.8*  PROCALCITON  --   --   --   --   --  0.12  --  0.20 0.22 0.20  LATICACIDVEN  --   --   --   --  2.8* 2.9*  --   --   --   --   BNP 272.4*  --   --   --   --   --   --  174.5* 192.8* 88.1  MG  --   --   --   --   --   --   --  2.0 2.4 2.2  CALCIUM  --  9.3  --   --   --   --  9.6 9.6 9.3 9.0   < > = values in this interval not displayed.     Total Time in preparing paper work, data evaluation and todays exam - 35 minutes  Signature  -    Lala Lund M.D on 09/14/2022 at 10:31 AM   -  To page go to [email or web address]

## 2022-09-14 NOTE — TOC Transition Note (Signed)
Transition of Care [facility]) - CM/SW Discharge Note   Patient Details  Name: [name] MRN: [id number] Date of Birth: Nov 19, 1940  [facility]) CM/SW Contact:  Carles Collet, RN Phone Number: 09/14/2022, 11:21 AM   Clinical Narrative:     Spoke w patient, verified over the phone that he has home oxygen with [employer]. He states that wife will pick him up and will bring POC from home. Discussed w [place] from [place], notified of his increase in home O2 requirement. She will send tank to the room, as wife's POC likely only goes to 3L, and will have the company set up a home concentrator that goes to 10L, current one in home will go to 5L and will meet patient's needs for now.  Nebulizer will be brought up to room for him to take home. I spoke w [facility] and his nebulizer meds are $7. No other TOC needs identified.   Final next level of care: Home/Self Care Barriers to Discharge: No Barriers Identified   Patient Goals and CMS Choice      Discharge Placement                         Discharge Plan and Services Additional resources added to the After Visit Summary for                  DME Arranged: Oxygen, Nebulizer machine DME Agency: [employer] Date DME Agency Contacted: 09/14/22 Time DME Agency Contacted: [phone] Representative spoke with at DME Agency: [place]: NA          Social Determinants of Health ([place]) Interventions SDOH Screenings   Food Insecurity: No Food Insecurity (09/11/2022)  Housing: Low Risk  (09/11/2022)  Transportation Needs: No Transportation Needs (09/11/2022)  Utilities: Not At Risk (09/11/2022)  Alcohol Screen: Low Risk  (04/03/2022)  Depression (PHQ2-9): Medium Risk (08/23/2022)  Financial Resource Strain: Low Risk  (04/03/2022)  Physical Activity: Inactive (04/03/2022)  Social Connections: Moderately Integrated (04/03/2022)  Stress: No Stress Concern Present (04/03/2022)  Tobacco Use: Low Risk  (09/12/2022)     Readmission  Risk Interventions     No data to display

## 2022-09-14 NOTE — Discharge Instructions (Signed)
Follow with Primary MD [name], MD in 7 days   Get CBC, CMP, Magnesium, 2 view Chest X ray -  checked next visit with your primary MD   Activity: As tolerated with Full fall precautions use walker/cane & assistance as needed  Disposition Home    Diet: Heart Healthy -  Check your Weight same time everyday, if you gain over 2 pounds, or you develop in leg swelling, experience more shortness of breath or chest pain, call your Primary MD immediately. Follow Cardiac Low Salt Diet and 1.5 lit/day fluid restriction.  Special Instructions: If you have smoked or chewed Tobacco  in the last 2 yrs please stop smoking, stop any regular Alcohol  and or any Recreational drug use.  On your next visit with your primary care physician please Get Medicines reviewed and adjusted.  Please request your Prim.MD to go over all Hospital Tests and Procedure/Radiological results at the follow up, please get all Hospital records sent to your Prim MD by signing hospital release before you go home.  If you experience worsening of your admission symptoms, develop shortness of breath, life threatening emergency, suicidal or homicidal thoughts you must seek medical attention immediately by calling 911 or calling your MD immediately  if symptoms less severe.  You Must read complete instructions/literature along with all the possible adverse reactions/side effects for all the Medicines you take and that have been prescribed to you. Take any new Medicines after you have completely understood and accpet all the possible adverse reactions/side effects.

## 2022-09-14 NOTE — Progress Notes (Signed)
The patient is discharged home and is waiting on his transportation.

## 2022-09-14 NOTE — Progress Notes (Signed)
Mobility Specialist Progress Note:   09/14/22 1023  Mobility  Activity Ambulated independently in hallway  Level of Assistance Modified independent, requires aide device or extra time  Assistive Device Other (Comment) (IV Pole)  Distance Ambulated (ft) 500 ft  Activity Response Tolerated well  Mobility Referral Yes  $Mobility charge 1 Mobility   Pt received in chair and agreeable. No complaints. Pt returned to chair with all needs met and call bell in reach. Pt requested second walk today, will f/u as schedule allows.   Andrey Campanile Mobility Specialist Please contact via SecureChat or  Rehab office at [phone]

## 2022-09-15 LAB — CULTURE, BLOOD (ROUTINE X 2)
Culture: NO GROWTH
Culture: NO GROWTH

## 2022-09-16 ENCOUNTER — Telehealth: Payer: Self-pay

## 2022-09-16 LAB — HYPERSENSITIVITY PNEUMONITIS
A. Pullulans Abs: NEGATIVE
A.Fumigatus #1 Abs: NEGATIVE
Micropolyspora faeni, IgG: NEGATIVE
Pigeon Serum Abs: NEGATIVE
Thermoact. Saccharii: NEGATIVE
Thermoactinomyces vulgaris, IgG: NEGATIVE

## 2022-09-16 NOTE — Patient Outreach (Signed)
  Care Coordination TOC Note Transition Care Management Follow-up Telephone Call Date of discharge and from where: 09/15/22-[facility]  DX: "PNA-both lungs" How have you been since you were released from the hospital? Patient states he is still in the bed and has not gotten up yet today. He feels tired-trying to rest. He denies any SOB at present. States he does get short winded whenever he is up moving around.He is using oxygen at 5L/min cont. He has pulse ox and oxygen level running in the 90% at rest. He states level do drop when he is up moving around. Reviewed inhaler and neb txs and resp sx mgmt measures with patient.  Any questions or concerns? No  Items Reviewed: Did the pt receive and understand the discharge instructions provided? Yes  Medications obtained and verified? Yes  Other? Yes  Any new allergies since your discharge? No  Dietary orders reviewed? Yes-heart healthy/low salt 1.5L fluid restriction Do you have support at home? [facility] and Equipment/Supplies: Were home health services ordered? not applicable If so, what is the name of the agency? N/A  Has the agency set up a time to come to the patient's home? not applicable Were any new equipment or medical supplies ordered?  Yes: oxygen,neb machine What is the name of the medical supply agency? [employer] Were you able to get the supplies/equipment? yes Do you have any questions related to the use of the equipment or supplies? No  Functional Questionnaire: (I = Independent and D = Dependent) ADLs: A  Bathing/Dressing- A  Meal Prep- A  Eating- I  Maintaining continence- I  Transferring/Ambulation- I  Managing Meds- I  Follow up appointments reviewed:  PCP Hospital f/u appt confirmed? Yes  Scheduled to see Dr. Carlota Raspberry on 09/26/22 @ 11:20 am. [facility] f/u appt confirmed?  Spouse will call lung MD today to make appt   Are transportation arrangements needed? No  If their condition worsens, is  the pt aware to call PCP or go to the Emergency Dept.? Yes Was the patient provided with contact information for the PCP's office or ED? Yes Was to pt encouraged to call back with questions or concerns? Yes  SDOH assessments and interventions completed:   Yes SDOH Interventions Today    Flowsheet Row Most Recent Value  SDOH Interventions   Food Insecurity Interventions Intervention Not Indicated  Transportation Interventions Intervention Not Indicated       Care Coordination Interventions:  Education provided on resp mgmt    Encounter Outcome:  Pt. Visit Completed    [name] Montgomery, RN,BSN,CCM [facility] Telephonic Care Management Coordinator Direct Phone: [phone] Toll Free: [phone] Fax: [phone]

## 2022-09-17 ENCOUNTER — Ambulatory Visit: Payer: Medicare HMO | Admitting: Primary Care

## 2022-09-17 LAB — AEROBIC/ANAEROBIC CULTURE W GRAM STAIN (SURGICAL/DEEP WOUND): Culture: NO GROWTH

## 2022-09-17 NOTE — Progress Notes (Deleted)
Office Visit Note  Patient: [name]             Date of Birth: Nov 24, 1940           MRN: [id number]             PCP: [name], MD Referring: [name], MD Visit Date: 10/01/2022 Occupation: '@GUAROCC'$ @  Subjective:  No chief complaint on file.   History of Present Illness: [name] is a 82 y.o. male ***     Activities of Daily Living:  Patient reports morning stiffness for *** {minute/hour:19697}.   Patient {ACTIONS;DENIES/REPORTS:21021675::"Denies"} nocturnal pain.  Difficulty dressing/grooming: {ACTIONS;DENIES/REPORTS:21021675::"Denies"} Difficulty climbing stairs: {ACTIONS;DENIES/REPORTS:21021675::"Denies"} Difficulty getting out of chair: {ACTIONS;DENIES/REPORTS:21021675::"Denies"} Difficulty using hands for taps, buttons, cutlery, and/or writing: {ACTIONS;DENIES/REPORTS:21021675::"Denies"}  No Rheumatology ROS completed.   PMFS History:  Patient Active Problem List   Diagnosis Date Noted   Acute on chronic respiratory failure with hypoxia ([place]) 09/10/2022   Sepsis due to pneumonia ([place]) 09/10/2022   Hypokalemia 09/10/2022   Normocytic anemia 09/10/2022   Acute respiratory failure with hypoxia ([place]) 07/20/2022   Myeloproliferative disorder ([place]) 07/20/2022   Leukocytosis 06/03/2022   Hypertrophy of prostate without urinary obstruction and other lower urinary tract symptoms (LUTS) 12/04/2021   Prostate cancer ([place]) 05/29/2021   GERD (gastroesophageal reflux disease) 12/15/2017   Unstable angina ([place]) 12/05/2017   Angina at rest 12/05/2017   Atypical chest pain    Dyslipidemia    COPD not affecting current episode of care    Essential hypertension 05/29/2017   Abnormal nuclear cardiac imaging test 01/14/2017   Bronchiectasis without complication ([place]) 15/17/6160   ILD (interstitial lung disease) ([place]) 11/27/2016   Dizziness 10/21/2016   Cough variant asthma  vs UACS 07/22/2016   Obstructive sleep apnea 12/01/2015   Acute  bronchitis 11/30/2015   Swelling of lower extremity 10/16/2015   Benign mole 02/04/2014   Impaired glucose tolerance 10/26/2013   NAFLD (nonalcoholic fatty liver disease) 04/27/2013   Thrombocytopenia ([place]) 10/21/2012   Exertional angina 09/03/2012   MALARIA 12/05/2009   COLONIC POLYPS 12/05/2009   HYPERCHOLESTEROLEMIA 12/05/2009   ANXIETY 12/05/2009   HEMORRHOIDS 12/05/2009   Allergic rhinitis 12/05/2009   GANGLION CYST, WRIST, LEFT 12/05/2009   ROTATOR CUFF TEAR 12/05/2009   OTHER DISORDER OF MUSCLE LIGAMENT AND FASCIA 12/05/2009   ABNORMAL CHEST XRAY 12/05/2009   CAD (coronary artery disease) 04/05/2009    Past Medical History:  Diagnosis Date   Abnormal chest x-ray    Allergic rhinitis    Ankylosing spondylitis (HCC)    Anxiety    Arthritis    Colonic polyp    Coronary atherosclerosis of native coronary artery    Cough    Dizziness 10/21/2016   Ganglion cyst of wrist    left   Heart attack ([place]) 2008   Hemorrhoids    History of malaria    Hyperlipidemia    with low HDL   Hypertension    Kidney stone    Other disorder of muscle, ligament, and fascia    Rotator cuff tear    Swelling of lower extremity 10/16/2015    Family History  Problem Relation Age of Onset   Cancer Mother        melanoma   Emphysema Mother    Rheum arthritis Mother    Diabetes Mother    Alcohol abuse Father    Past Surgical History:  Procedure Laterality Date   bicep rupture     BRONCHIAL BIOPSY  09/12/2022  Procedure: BRONCHIAL BIOPSIES;  Surgeon: Juanito Doom, MD;  Location: [facility];  Service: Cardiopulmonary;;   BRONCHIAL WASHINGS  09/12/2022   Procedure: BRONCHIAL WASHINGS;  Surgeon: Juanito Doom, MD;  Location[facility] ENDOSCOPY;  Service: Cardiopulmonary;;   CORONARY ANGIOPLASTY WITH STENT PLACEMENT     HEMORRHOID SURGERY  05/2009   in [place]   LEFT HEART CATH AND CORONARY ANGIOGRAPHY N/A 01/14/2017   Procedure: Left Heart Cath and Coronary Angiography;  Surgeon: [name], MD;  Location: [facility] CV LAB;  Service: Cardiovascular;  Laterality: N/A;   LEFT HEART CATHETERIZATION WITH CORONARY ANGIOGRAM N/A 09/03/2012   Procedure: LEFT HEART CATHETERIZATION WITH CORONARY ANGIOGRAM;  Surgeon: [name], MD;  Location: [facility] CATH LAB;  Service: Cardiovascular;  Laterality: N/A;   PROSTATE BIOPSY     ROTATOR CUFF REPAIR     bilat by Dr. French Ana   VIDEO BRONCHOSCOPY N/A 09/12/2022   Procedure: VIDEO BRONCHOSCOPY WITH FLUORO;  Surgeon: Juanito Doom, MD;  Location: [facility];  Service: Cardiopulmonary;  Laterality: N/A;   Social History   Social History Narrative   Not on file   Immunization History  Administered Date(s) Administered   Fluad Quad(high Dose 65+) 05/01/2019, 05/28/2021, 05/01/2022   Influenza Split 06/01/2012   Influenza Whole 05/30/2009, 04/20/2010   Influenza, High Dose Seasonal PF 05/19/2017, 05/19/2018   Influenza-Unspecified 05/13/2007, 07/12/2013, 05/10/2016, 06/23/2020   PFIZER(Purple Top)SARS-COV-2 Vaccination 09/18/2019, 10/09/2019, 07/13/2020   PNEUMOCOCCAL CONJUGATE-20 01/14/2022   Pneumococcal Conjugate-13 05/19/2018   Pneumococcal Polysaccharide-23 08/22/2009   Tdap 12/04/2010, 06/04/2011, 07/31/2018   Zoster Recombinat (Shingrix) 01/14/2022   Zoster, Live 05/17/2008   Zoster, Unspecified 04/17/2022     Objective: Vital Signs: There were no vitals taken for this visit.   Physical Exam   Musculoskeletal Exam: ***  CDAI Exam: CDAI Score: -- Patient Global: --; Provider Global: -- Swollen: --; Tender: -- Joint Exam 10/01/2022   No joint exam has been documented for this visit   There is currently no information documented on the homunculus. Go to the Rheumatology activity and complete the homunculus joint exam.  Investigation: No additional findings.  Imaging: DG C-ARM BRONCHOSCOPY  Result Date: 09/12/2022 C-ARM BRONCHOSCOPY: Fluoroscopy was utilized by the requesting physician.  No radiographic  interpretation.   [name] PORT 1 VIEW  Result Date: 09/12/2022 CLINICAL DATA:  Status post bronchoscopy. EXAM: PORTABLE CHEST 1 VIEW COMPARISON:  09/12/2022. FINDINGS: 1304 hours. Persistent low lung volumes with accentuation of the pulmonary vasculature. Improved aeration of the left lower lobe with persistent retrocardiac opacity. Similar bilateral hazy airspace opacities. No large pleural effusion or pneumothorax. IMPRESSION: Improved aeration of the left lower lobe with persistent retrocardiac opacity and similar bilateral hazy airspace opacities. Electronically Signed   By: Emmit Alexanders M.D.   On: 09/12/2022 13:31   [name] Port 1 View  Result Date: 09/12/2022 CLINICAL DATA:  Shortness of breath EXAM: PORTABLE CHEST 1 VIEW COMPARISON:  Yesterday FINDINGS: Bilateral airspace opacity which is progressed. Lung volumes are low. Partially obscured heart size which is unchanged. Asymmetric elevation of the right diaphragm. IMPRESSION: Low volume chest with worsening aeration, reference recent chest CT. Electronically Signed   By: Jorje Guild M.D.   On: 09/12/2022 06:41   [name] Port 1 View  Result Date: 09/11/2022 CLINICAL DATA:  Shortness of breath. EXAM: PORTABLE CHEST 1 VIEW COMPARISON:  09/10/2022 FINDINGS: 0649 hours. Low volume film with stable asymmetric elevation right hemidiaphragm. The cardio pericardial silhouette is enlarged. Volume loss again noted left  hemithorax. There is diffuse interstitial and patchy airspace disease in both lungs, most pronounced at the left base. The visualized bony structures of the thorax are unremarkable. Telemetry leads overlie the chest. IMPRESSION: Low volume film with diffuse interstitial and patchy airspace disease in both lungs, most pronounced at the left base. Imaging features compatible with interstitial lung disease/fibrosis as characterized on yesterday's high-resolution chest CT. Electronically Signed   By: Misty Stanley M.D.   On: 09/11/2022  06:55   CT Chest High Resolution  Result Date: 09/10/2022 CLINICAL DATA:  Interstitial lung disease EXAM: CT CHEST WITHOUT CONTRAST TECHNIQUE: Multidetector CT imaging of the chest was performed following the standard protocol without intravenous contrast. High resolution imaging of the lungs, as well as inspiratory and expiratory imaging, was performed. RADIATION DOSE REDUCTION: This exam was performed according to the departmental dose-optimization program which includes automated exposure control, adjustment of the mA and/or kV according to patient size and/or use of iterative reconstruction technique. COMPARISON:  Multiple priors, most recent CT dated July 20, 2022 FINDINGS: Cardiovascular: Normal heart size. Pericardial effusion. Normal caliber thoracic aorta with moderate calcified plaque. Severe left main and three-vessel coronary artery calcifications. Mediastinum/Nodes: Small hiatal hernia. Thyroid is unremarkable. Mildly enlarged mediastinal and hilar lymph nodes, similar to prior exam and likely reactive. Lungs/Pleura: Central airways are patent. Evidence of mild-to-moderate air trapping. Elevation of the right hemidiaphragm. Peribronchovascular mid and lower lung predominant reticular opacities with associated traction bronchiectasis, difficult to assess for progressive fibrotic changes given superimposed acute ground-glass opacities. No evidence of honeycomb change. Increased bilateral ground-glass opacities, most evident in the superior portion of the right lower lobe right upper lobe and posterior left upper lobe. New subtle centrilobular ground-glass nodules also seen in the right upper lobe. Dendriform pulmonary ossification of the lower lobes again seen. No pleural effusion or pneumothorax. Upper Abdomen: No acute abnormality. Musculoskeletal: No chest wall mass or suspicious bone lesions identified. IMPRESSION: 1. Fibrotic interstitial lung disease in a non UIP pattern with increased  bilateral ground-glass opacities and new subtle centrilobular ground-glass nodules of the right upper lobe. Findings are likely due to superimposed infection, pulmonary edema, or acute exacerbation of ILD. Findings are suggestive of an alternative diagnosis (not UIP) per consensus guidelines: Diagnosis of Idiopathic Pulmonary Fibrosis: An Official ATS/ERS/JRS/ALAT Clinical Practice Guideline. [place], Iss 5, [id number], Apr 12 2017. 2. Severe left main and three-vessel coronary artery calcifications. 3. Aortic Atherosclerosis (ICD10-I70.0). Electronically Signed   By: Yetta Glassman M.D.   On: 09/10/2022 15:51   [name] Portable 1 View  Result Date: 09/10/2022 CLINICAL DATA:  Shortness of breath. EXAM: PORTABLE CHEST 1 VIEW COMPARISON:  07/20/2022. FINDINGS: The heart size and mediastinal contours are stable. There is atherosclerotic calcification of the aorta. Lung volumes are low. There is hazy airspace disease in the lungs bilaterally with consolidation at the left lung base. No definite effusion or pneumothorax. Evidence of prior rotator cuff surgery is noted on the left. No acute osseous abnormality. IMPRESSION: Hazy airspace disease in the lungs bilaterally with consolidation at the left lung base, concerning for pneumonia. Electronically Signed   By: Brett Fairy M.D.   On: 09/10/2022 04:34    Recent Labs: Lab Results  Component Value Date   WBC 25.0 (H) 09/14/2022   HGB 10.4 (L) 09/14/2022   PLT 249 09/14/2022   NA 136 09/14/2022   K 3.9 09/14/2022   CL 94 (L) 09/14/2022   CO2 30 09/14/2022  GLUCOSE 70 09/14/2022   BUN 26 (H) 09/14/2022   CREATININE 0.96 09/14/2022   BILITOT 0.5 09/14/2022   ALKPHOS 81 09/14/2022   AST 22 09/14/2022   ALT 23 09/14/2022   PROT 6.6 09/14/2022   ALBUMIN 3.5 09/14/2022   CALCIUM 9.0 09/14/2022   GFRAA 96 01/03/2020   QFTBGOLDPLUS NEGATIVE 04/30/2022    Speciality Comments: No specialty comments available.  Procedures:   No procedures performed Allergies: Celecoxib and Tramadol   Assessment / Plan:     Visit Diagnoses: No diagnosis found.  Orders: No orders of the defined types were placed in this encounter.  No orders of the defined types were placed in this encounter.   Face-to-face time spent with patient was *** minutes. Greater than 50% of time was spent in counseling and coordination of care.  Follow-Up Instructions: No follow-ups on file.   Earnestine Mealing, CMA  Note - This record has been created using Editor, commissioning.  Chart creation errors have been sought, but may not always  have been located. Such creation errors do not reflect on  the standard of medical care.

## 2022-09-18 ENCOUNTER — Telehealth: Payer: Self-pay | Admitting: Internal Medicine

## 2022-09-18 LAB — CYTOLOGY - NON PAP

## 2022-09-18 NOTE — Telephone Encounter (Signed)
[name] can you please advise on patients Bronchoscopy results?

## 2022-09-19 ENCOUNTER — Ambulatory Visit: Payer: Medicare HMO | Admitting: Family Medicine

## 2022-09-19 NOTE — Telephone Encounter (Signed)
Lot of inflammatory cells that Dr Lake Bells probably shared in the hospital. No futher updates. No growth. Pathology is still ? Pending. [place] have update at followup next week

## 2022-09-19 NOTE — Telephone Encounter (Signed)
Called and spoke with [name]. She verbalized understanding.   Nothing further needed at time of call.

## 2022-09-20 ENCOUNTER — Telehealth: Payer: Self-pay | Admitting: Internal Medicine

## 2022-09-20 ENCOUNTER — Other Ambulatory Visit: Payer: Self-pay | Admitting: Internal Medicine

## 2022-09-20 NOTE — Telephone Encounter (Signed)
Spoke with patients wife. She states once patient was discharged from the hospital his o2 had been increased from 2L to 5L. She states he has about 4 tanks left and [employer] wont send him anymore without correct order. I looked in the last office note and it states continue 2L of 02 at night.  Can you please advise if  patient needs to be on 5L so we can get order placed to [employer]

## 2022-09-20 NOTE — Telephone Encounter (Signed)
Patient is returning call to get update. Requesting return call.

## 2022-09-21 NOTE — Telephone Encounter (Signed)
He got sick and ended up in hospital and is now needing more o2. Sot he instructions have to be from the hospital note/order. If they did not do it correctly per the DME company: then he would need to come in and see an app and get the right order

## 2022-09-23 ENCOUNTER — Telehealth: Payer: Self-pay | Admitting: Pulmonary Disease

## 2022-09-23 NOTE — Telephone Encounter (Signed)
I called Mr. Leverett to review the results of his transbronchial biopsy which showed fibrotic changes which showed no findings of malignancy or infection or eosinophils or granuloma.  He says he has an appointment with [name] tomorrow morning.

## 2022-09-23 NOTE — Telephone Encounter (Addendum)
Called and spoke with patients wife cheryl. She verbalized understanding. [name] stated that the patient had an appointment tomorrow with [name] and that they would discuss patients oxygen needs tomorrow.   Nothing further needed.

## 2022-09-24 ENCOUNTER — Encounter: Payer: Self-pay | Admitting: Internal Medicine

## 2022-09-24 ENCOUNTER — Ambulatory Visit (INDEPENDENT_AMBULATORY_CARE_PROVIDER_SITE_OTHER): Payer: Medicare HMO | Admitting: Internal Medicine

## 2022-09-24 VITALS — BP 100/50 | HR 92 | Temp 97.7°F | Ht 67.0 in | Wt 161.0 lb

## 2022-09-24 DIAGNOSIS — Z7189 Other specified counseling: Secondary | ICD-10-CM | POA: Diagnosis not present

## 2022-09-24 DIAGNOSIS — R627 Adult failure to thrive: Secondary | ICD-10-CM

## 2022-09-24 DIAGNOSIS — J679 Hypersensitivity pneumonitis due to unspecified organic dust: Secondary | ICD-10-CM | POA: Diagnosis not present

## 2022-09-24 DIAGNOSIS — J849 Interstitial pulmonary disease, unspecified: Secondary | ICD-10-CM | POA: Diagnosis not present

## 2022-09-24 DIAGNOSIS — J9611 Chronic respiratory failure with hypoxia: Secondary | ICD-10-CM

## 2022-09-24 LAB — SURGICAL PATHOLOGY, GROSS ONLY (NOT ARMC)

## 2022-09-24 MED ORDER — PREDNISONE 10 MG PO TABS: 40.0000 mg | ORAL_TABLET | Freq: Every day | ORAL | 1 refills | Status: AC

## 2022-09-24 NOTE — Patient Instructions (Addendum)
ICD-10-CM   1. Chronic respiratory failure with hypoxia ([place])  J96.11 Ambulatory referral to Hospice    2. Hypersensitivity pneumonitis due to organic dust [facility])  J67.9 Ambulatory referral to Hospice    3. ILD (interstitial lung disease) ([place])  J84.9 Ambulatory referral to Hospice    4. Goals of care, counseling/discussion  Z71.89 Ambulatory referral to Hospice    5. Failure to thrive in adult  R62.7      Progressive decline Severe disease I Worry that your prognosis is shortened to few to several months - pulse ox 87% on 5L [place]  Plan  - increase prednisone to 11m per day -> 2 weeks later go to 256mper day and stay there - increase o2 to keep pulse ox > = 88% (might need 10L [place])  - order [facility] to [facility] 09/24/20[facility] - refer home hospice - dc symbicort  - do nebulizer duoneb q6h scheduled with albuterol as needed  - stop OFEV - continue other medicines - DNR recommended  Followup  - 2 week video visit with Dr RaChase Callerr APP

## 2022-09-24 NOTE — Progress Notes (Unsigned)
OV 04/30/2022 -transferring from Dr. Christinia Gully to [name] for ILD.  Subjective:  Patient ID: [name], male , DOB: Jul 01, 1941 , age 82 y.o. , MRN: [id number] , ADDRESS: [place] [address] PCP [name], MD Patient Care Team: [name], MD as PCP - General (Family Medicine) [name], MD as PCP - Cardiology (Cardiology)  This Provider for this visit: Treatment Team:  Attending Provider: Brand Males, MD    04/30/2022 -   Chief Complaint  Patient presents with   Consult    Dry cough and wheezing at times      HPI [name] 82 y.o. - referred by DR Bo Merino over concerns of ILD.  He saw rheumatology Dr. Lorelee Cover on 03/28/2022.  This is because of stiffness in his joints neck and back..  Diagnosed with degenerative disc disease and osteoarthritis.  In July 2023 he got COVID 19.  Treated as outpatient.  After that he started having some shortness of breath.  When rheumatology reviewed this history in August 2023 they note his previous reports of ILD on CT scan of the chest particularly high-res April 2018.  Last pulmonary function test was in December 2020.  Autoimmune labs were ordered.  This is come back normal and this referral was made.  Therefore is here.  To me he is reporting only cough.  He has chronic cough for the last 80 years.  There are some wheezing with a cough.  He does cough at night he does bring up some sputum that is clear or light yellow.  For the last 1 week his voice is strained.  He has never had hemoptysis.  He does cough when he lies down.  It does affect his voice.  He does clear the throat.  There is no tickle in the back of the throat.  There is barely any shortness of breath except   [facility] Comprehensive ILD Questionnaire  Symptoms:  Reports chronic cough for the last 5 years.    Past Medical History :  = He has a history of prostate cancer that is early - He  has seen Dr. Melvern Sample for low platelets and apparently this resolved - he has chronic mild anemia 12gm% since sept 2023 - MI in 2008 and status post stents - He has chronic cough and is on PPI and H2 blockade.  But he says he does not have any active acid reflux - She did check he has for rheumatoid arthritis but rheumatology notes indicate DJD - He does have sleep apnea uses CPAP. - She had infectious mononucleosis in college in 1960s - She did mark positive for COVID vaccine and COVID disease in July 2023.  He was not hospitalized for COVID -     DIAGNOSIS: OCt 2023 1) myeloproliferative disorder presented [name] leukocytosis with positive JAK2 mutation  with high WBC and splenomegaly - Rx hydroyurea OCt 2023 2) prostate adenocarcinoma currently under the care of Dr. Delrae Sawyers 3) JAK2 mutation was reported to be abnormal consistent with myeloproliferative disorde . FISH study for BCR/ABL was negative.       ROS:  -Positive for fatigue - Positive for arthralgia - 10 pound unintentional weight loss in the last 6 months because of wife cardiac illness.  But he is feeling better because of this - Nonspecific rash  FAMILY HISTORY of LUNG DISEASE:  -Both parents had COPD.  Mother had asthma.  Son at sarcoid.  Rest  of family history is negative.*  PERSONAL EXPOSURE HISTORY:  -Never smoked cigarettes but he did do some passive smoking in the past.  No cigar use no marijuana use no pipe use.  No cocaine use no intravenous drug use  HOME  EXPOSURE and HOBBY DETAILS :  -Lives in a 80 year old home for the last 15 years.  He does have a [place] when he goes on vacation but this only once a year.  He does have a cockatiel bird in his office for the last 20 years.  He spent several hours in a week in that office.  He also cleans the droppings and feathers from the bird cage.  There is no mold anywhere in the system.  He plays a Microbiologist harmonica there is no mold or mildew in  this.  OCCUPATIONAL HISTORY (122 questions) : -He wants rental property.  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  Other than that he has a bird exposure.  -0 he also has a [place] War veteran and is believed to have been exposed to agent orange.  PULMONARY TOXICITY HISTORY (27 items):  -Denies  INVESTIGATIONS: Below Pulmonary function test shows progression between 22018 and 2020  High-resolution CT chest from several years ago in 2018 nonspecific ILD changes but seems more prominent on the CT abdomen lung cuts August 2022.  [Personally visualized        CT Chest data abd lung image Aug 2022  IMPRESSION: 1. No non-contrast CT findings of the abdomen or pelvis to explain right lower quadrant pain. No evidence of abdominal hernia. 2. Normal appendix. 3. No evidence of urinary tract calculus or hydronephrosis. 4. Mild prostatomegaly with thickening of the urinary bladder wall, likely related to chronic outlet obstruction. 5. Moderate fibrotic scarring of the included bilateral lung bases, which appears somewhat increased compared to prior examination 05/19/2017. Consider pulmonary referral and dedicated chest interstitial lung disease CT of the chest to further evaluate. 6. Coronary artery disease.   Aortic Atherosclerosis (ICD10-I70.0).     Electronically Signed   By: Eddie Candle M.D.   On: 04/02/2021 13:05  No results found.        OV 06/25/2022  Subjective:  Patient ID: [name], male , DOB: 1941/02/07 , age 82 y.o. , MRN: [id number] , ADDRESS: [place] [address] PCP [name], MD Patient Care Team: [name], MD as PCP - General (Family Medicine) [name], MD as PCP - Cardiology (Cardiology)  This Provider for this visit: Treatment Team:  Attending Provider: Brand Males, MD    06/25/2022 -    Chief Complaint  Patient presents with   Follow-up    Pt states he has had a cough that is not going away and also states that he has had postnasal drainage. States his cough is worse in the mornings and also at night when laying down for bed. States he will cough up a lot of clear phlegm in the mornings.   History of sleep apnea on CPAP [Dr. Elisabeth Cara Sood]  Follow-up interstitial lung disease: Progressive phenotype [IPF versus chronic HP)  -Features of IPF include age greater than 2, Caucasian, male gender, occupation Dispensing optician work  -Features for chronic HP include: Having a Loss adjuster, chartered bird in the house.  HPI DEVONTAY GERSTENBERGER 82 y.o. -returns for follow-up.  He is here to discuss  his work-up.  His high-resolution CT chest is described as definitive UIP and with definitive progression.  There is also progression his pulmonary function test.  25% decline in the last 5 years averaging 5% decline in FVC per year.  His serology is negative.  This along with age greater than 31, Velcro crackles in the lung base, white male would make him have a diagnosis of IPF.  However on my personal visualization while I do agree with progression I am not sure about the definite of UIP features.  In addition there is?  Specks of pleural calcification.  This would raise the question of asbestosis in the context of exposure of bird question of hypersensitive pneumonitis is there.  I have therefore sought the opinion of Dr. Yetta Glassman radiologist via email.  There are several issues to deal with at this visit  -New diagnosis of myeloproliferative disorder with JAK2 mutation October 2023 by Dr. [name].  Started on hydroxyurea.  Sophia is tolerating it well.  Is been on it for 3 weeks.  No nausea but has been given some Zofran for this or Compazine.  -He is also having epistaxis through the right nostril for the last several months.  At least 1 time per week.  It is intermittent.  Sometimes it is every other week.  No  specific trigger factor other than the fact his nose does feel dry with the CPAP.  He does not use any saline nasal spray.  But it is definitely new.  He is on aspirin.  -Cough: This is worse compared to the last visit.  It is there early in the morning and later in the evening.  Associated with sinus drainage.  He is on chlorpheniramine tablets but it is not working.  In the past he has had RAST allergy profile his blood eosinophils were normal.  He does have some house dust mite allergy and that was the only positive test but otherwise normal.  He does take some cough drops and it does help.  He also feels the cough is stuck in the throat and associated with sinus drainage.  He has never seen the ENT doctor  -Shortness of breath: He had new onset insidious onset of shortness of breath with exertion relieved by rest.  Severe it is listed below.  It is not the last visit.  No associated chest pain  -Unintentional weight loss for the last 6 months of at least 10 pounds.  Current weight is 164 pounds.   HRCT 05/04/22  Narrative & Impression  CLINICAL DATA:  Interstitial lung disease.   EXAM: CT CHEST WITHOUT CONTRAST   TECHNIQUE: Multidetector CT imaging of the chest was performed following the standard protocol without intravenous contrast. High resolution imaging of the lungs, as well as inspiratory and expiratory imaging, was performed.   RADIATION DOSE REDUCTION: This exam was performed according to the departmental dose-optimization program which includes automated exposure control, adjustment of the mA and/or kV according to patient size and/or use of iterative reconstruction technique.   COMPARISON:  11/26/2016.   FINDINGS: Cardiovascular: Atherosclerotic calcification of the aorta, aortic valve and coronary arteries. Enlarged pulmonary trunk and heart. No pericardial effusion.   Mediastinum/Nodes: Mediastinal lymph nodes measure up to 10 mm in the low right paratracheal  station, as before. Hilar regions are difficult to evaluate without IV contrast. No axillary adenopathy. Esophagus is grossly unremarkable.   Lungs/Pleura: Peripheral and basilar predominant subpleural reticulation, traction bronchiectasis/bronchiolectasis, ground-glass and single layer honeycombing. Findings have  progressed from 11/26/2016. Calcified granulomas. No suspicious pulmonary nodules. No pleural fluid. Airway is unremarkable. Minimal trapping.   Upper Abdomen: Right hemidiaphragm is elevated. 10 mm low-attenuation lesion in the right hepatic lobe, too small to characterize but likely a cyst. Visualized portions of the liver, gallbladder, adrenal glands and right kidney are unremarkable. Spleen appears enlarged but is incompletely imaged. Visualized portions of the pancreas, stomach and bowel are unremarkable with the exception of a small hiatal hernia. No upper abdominal adenopathy.   Musculoskeletal: Degenerative changes in the spine. No worrisome lytic or sclerotic lesions.   IMPRESSION: 1. Pulmonary parenchymal pattern of fibrosis, as detailed above, progressive from 11/16/2016. Findings are consistent with UIP per consensus guidelines: Diagnosis of Idiopathic Pulmonary Fibrosis: An Official ATS/ERS/JRS/ALAT Clinical Practice Guideline. [place], Iss 5, [phone], Apr 12 2017. 2. Minimal air trapping indicative of small airways disease. 3. Spleen appears enlarged but is incompletely imaged. 4. Aortic atherosclerosis (ICD10-I70.0). Coronary artery calcification. 5. Enlarged pulmonic trunk, indicative of pulmonary arterial hypertension.     Electronically Signed   By: Lorin Picket M.D.   On: 05/06/2022 10:39    Latest Reference Range & Units 04/24/22 14:24  Anti Nuclear Antibody (ANA) NEGATIVE  NEGATIVE  Cyclic Citrullin Peptide Ab UNITS <16  ds DNA Ab IU/mL <1  RA Latex Turbid. <14 IU/mL <14  ENA SM Ab Ser-aCnc <1.0 NEG AI <1.0 NEG   C3 Complement 82 - 185 mg/dL 136  C4 Complement 15 - 53 mg/dL 44  Ribonucleic Protein(ENA) Antibody, IgG <1.0 NEG AI <1.0 NEG  SSA (Ro) (ENA) Antibody, IgG <1.0 NEG AI <1.0 NEG  SSB (La) (ENA) Antibody, IgG <1.0 NEG AI <1.0 NEG  Scleroderma (Scl-70) (ENA) Antibody, IgG <1.0 NEG AI <1.0 NEG   ECHO 05/01/22   IMPRESSIONS     1. Left ventricular ejection fraction, by estimation, is 60 to 65%. The  left ventricle has normal function. The left ventricle has no regional  wall motion abnormalities. Left ventricular diastolic parameters were  normal.   2. Right ventricular systolic function is normal. The right ventricular  size is normal. There is normal pulmonary artery systolic pressure. The  estimated right ventricular systolic pressure is [id number] mmHg.   3. The mitral valve is normal in structure. Trivial mitral valve  regurgitation. No evidence of mitral stenosis.   4. The aortic valve is tricuspid. Aortic valve regurgitation is not  visualized. Aortic valve sclerosis is present, with no evidence of aortic  valve stenosis.   5. The inferior vena cava is normal in size with greater than 50%  respiratory variability, suggesting right atrial pressure of 3 mmHg.    INPATIENT 07/21/22     82 year old male who I saw for the first time in September 2023 for interstitial lung disease.  At that time his symptoms are chronic cough there was concern of progressive phenotype because he had already had ILD.  He had a family history of COPD.  He never smoked cigarettes but did do some passive smoking.  He was in the [place] War and had agent orange exposure.  He worked in Dispensing optician.  All this along with age greater than 44 and Caucasian put him at risk for IPF.  However he did have bird exposure at this house for many years.  I saw him back in November 2023.  High-resolution CT chest thoracic radiology opinion more consistent with hypersensitive pneumonitis consistent with his bird exposure..  He has  had progression  on both CT scan of the chest and pulmonary function test [see below] just prior to Thanksgiving 2023 indicated him to get rid of the birds and consider antifibrotic nintedanib. He did get rid of the bird   However around this time he started calling into the office complaining of some mild onset hemoptysis [currently not an issue] and at this ER visit is reporting 2 to 3-week worsening dyspnea and cough and 2 days of worsening pedal edema.  He had new onset hypoxemia at rest.  Was hypoxemic requiring few liters nasal cannula.  According to the hospitalist he has responded to Lasix.  After arrival he has developed bilateral distal one third erythema on both shin along with warmth and tenderness to touch but no crepitus.  He is on Rocephin and azithromycin.   His BNP, troponin respiratory virus panel urine strep are negative his CT angiogram chest is negative for PE.  His echocardiogram shows good ejection fraction and no evidence of RV strain.  But he is definitely hypoxemic.   Pulmonary has been consulted.  Of note he is yet to start his antifibrotic.   OV 07/29/2022  Subjective:  Patient ID: [name], male , DOB: 09-28-1940 , age 24 y.o. , MRN: [id number] , ADDRESS: [place] [address] PCP [name], MD Patient Care Team: [name], MD as PCP - General (Family Medicine) [name], MD as PCP - Cardiology (Cardiology)  This Provider for this visit: Treatment Team:  Attending Provider: Brand Males, MD    07/29/2022 -   Chief Complaint  Patient presents with   Follow-up    Pt is here for follow up for ILD. Pt states he saw MR in hospital last week. Pt states he is having no energy. Pt states without oxygen his oxygen drops. Pt I s on prednsione currenlty. Pt thinks he is done with bactrim as of today.     HPI MOHAMAD WESTGATE 82 y.o. -returns for follow-up.  I am meeting his wife [name] for the first time.  After last  visit I discussed with radiologist.  CT scan was more consistent with hypersensitive pneumonitis and given his bird exposure we gave him this diagnosis and committed him to nintedanib but he got hospitalized with respiratory flareup.  Final diagnosis of volume overload diastolic heart failure versus pulmonary fibrosis flareup.  He is on a 3-week prednisone taper.  He is completed 1 week.  He has 2 more weeks left.  Is also on Bactrim for PJP prophylaxis.  In the interim he is got rid of the bird.  At the hospitalization he also had cellulitis and this is resolved.  He is much improved after the hospitalization.  He is no longer hypoxemic at rest but walking desaturation test did show hypoxemia [see below].  Other issues - We did check his RAST allergy panel and is positive for a number of environmental things.  This could partially explain his cough - Cellulitis: Resolved - Treatment: He his insurance is not covering to cover his nintedanib co-pay.  It is extremely expensive for him.  His income level is above the level for charity.  He is applied for VA benefits he might qualify.  He wants me to do a letter that he has ILD.  This will be to the [facility] -Weight loss: Last visit he had weight loss this needs to be tracked -Preoperative respiratory assessment: He has upcoming anal surgery.  This is later this  month.  I did advise him that given cellulitis and recent respiratory exacerbation to wait at least 4 weeks from the time of admission before he can have surgery. - Logistics: He wants his DME company notified so he can bleed his oxygen through the CPAP at night and he also wants a portable unit.            OV 09/05/2022  Subjective:  Patient ID: [name], male , DOB: June 21, 1941 , age 71 y.o. , MRN: [id number] , ADDRESS: [place] [address] PCP [name], MD Patient Care Team: [name], MD as PCP - General (Family Medicine) [name], MD as PCP - Cardiology (Cardiology)  This Provider for this visit: Treatment Team:  Attending Provider: Brand Males, MD    09/05/2022 -   Chief Complaint  Patient presents with   Follow-up    Pt is wanting to talk to MR about his sleep issues, cpap with oxygen etc.    Type of visit: Video Virtual Visit Identification of patient GAREY TURK with January 14, 1941 and MRN [id number] - 2 person identifier Risks: Risks, benefits, limitations of telephone visit explained. Patient understood and verbalized agreement to proceed Anyone else on call: wife Patient location: his home This provider location: [address]; [place]; [place] [address]. [facility]. [place] 82 y.o. -acute visit. Worsening cough. X 1 week.  Baseline  3 of 5. Now cough is 5 of 5. Severe cough. Desaturates when cough and immedialy after that pulse ox in 80s esp at night while on 2L. Also esay desats during walk - almost immediately. Will have to rest to increase upto 90%. No fever, Clear sputum +  - AL LOT of sputum +.  HE finished his hospital prednisone > 1 week ago . Worsening started after that. STill not on ofev or esbriet. Cost issues and VA coverage +. Says in past has had hard time doing PFTs. Symbicort not helping.          OV 09/24/2022  Subjective:  Patient ID: [name], male , DOB: 01-05-1941 , age 27 y.o. , MRN: [id number] , ADDRESS: [place] [address] PCP [name], MD Patient Care Team: [name], MD as PCP - General (Family Medicine) [name], MD as PCP - Cardiology (Cardiology)  This Provider for this visit: Treatment Team:  Attending Provider: Brand Males, MD    09/24/2022 -   Chief Complaint  Patient presents with   Follow-up    Hosp. f/up, O2 drops on exertion   Follow-up interstitial lung disease hypersensitive pneumonitis versus IPF (final analysis  hypersensitive pneumonitis]  -Progressive phenotype  -Bird exposure RAST allergy test + December 2023  -Dust mites, ragweed, tree  HPI MATTHIEU JAGGERS 82 y.o. -.  Presents for follow-up.  At this visit he is 87% on 5 L nasal cannula at rest sitting.  He and his wife tell me that he has dramatically declined.  I did a video visit with him recently and instituted steroids but despite that he had a second hospital admission.  Dr. Donnamae Jude [facility] did a bronchoscopy on him.  The working diagnosis was hypersensitive pneumonitis but paradoxically had significant amount of neutrophils which is a bad prognostic feature.  Patient did have a transbronchial biopsy.  This was done on 09/12/2022.  Shows acute lung injury with a lot of neutrophils.  And also fibrosis no granulomas no eosinophils.  No lymphocytic's.  He was discharged on steroids but he tells me that he is better than when he was admitted but he is getting worse after discharge.  He is currently on less than 20 mg/day of prednisone and is tapering.  He has had significant decline.  He is lost weight.  He is having failure to thrive.  In addition just going from the chair to the bathroom makes his pulse ox dropped on 5 L oxygen to 50% - 70%.  He is really worried about his life expectancy. His wife is here with him.  She is an independent historian.  He is worried about her.  She herself has health issues.  They have been married together 56 years.  They were in tears given his decline.  I did indicate to them that he is having flareup of his interstitial lung disease which is a bad prognostic feature.  Even steroids are not helping him.  We discussed various options.  We discussed the life expectancy to be in the order of weeks to months.  It could be even few months of several months.  Discussed the fact that he is hospice eligible.  Discussed the concept of hospice described below.  Because of significant caregiver burden and his own physical burden  they opted to consider hospice and will go to make a hospice referral.  Recommended DNR.  Did indicate to him that patients with fibrosis can dramatically decline rapidly.   Discussed medicare hospice benefit  - a medicare paid benefit  - for people with terminal qualifying  illness such as IPF, COPD, cancer with statistical prognosis < 6 months for which there is no cure - utilization of hospice shows people live longer paradoxically than those without hospice due to improved attention  - explained hospice locations - home, residential etc.,   - explained respite care options for caregivers  - explained that she could still get treatment for non-hospice diagnosis and still come to office to see me for hospice related diagnosis that i provide support for  - explained that hospice provides nursing, MD, chaplain, volunteer and medications and supplies paid through [facility] - ILD 04/30/2022 06/25/2022 164 pounds 07/29/2022 160# - recent admission dec 2023 for ILD flare v d-cHF and cellulitis  Current weight     O2 use ra ra   Shortness of Breath 0 -> 5 scale with 5 being worst (score 6 If unable to do)    At rest 0 0   Simple tasks - showers, clothes change, eating, shaving 0 1   Household (dishes, doing bed, laundry) 0 0   Shopping 0 0   Walking level at own pace 0 1   Walking up Stairs 0 1   Total (30-36) Dyspnea Score 0 3       Non-dyspnea symptoms (0-> 5 scale) 04/30/2022 06/25/2022   How bad is your cough? 2 3  How bad is your fatigue 2 2  How bad is nausea 0 0  How bad is vomiting?  0 0  How bad is diarrhea? 0 0  How bad is anxiety? 0 0  How bad is depression 0 0  Any chronic pain - if so where and how bad 2 0       Simple office walk 185 feet x  3 laps goal with forehead probe 04/30/2022  07/29/2022   O2 used ra ra  Number laps completed  3 1 of 3  Comments about pace avg avg  Resting Pulse Ox/HR 98% and 75/min 94% and HR 88  Final Pulse Ox/HR  95% and 99/min 86% and  HR 97  Desaturated </= 88% no yes  Desaturated <= 3% points yes yes  Got Tachycardic >/= 90/min yes yes  Symptoms at end of test non none  Miscellaneous comments x WORSE   PFT     Latest Ref Rng & Units 06/05/2022    3:46 PM 07/29/2019    8:58 AM 10/17/2016    9:59 AM  PFT Results  FVC-Pre L 2.01  2.57  2.73   FVC-Predicted Pre % 56  70  73   FVC-Post L 2.01  2.60  2.76   FVC-Predicted Post % 56  71  74   Pre FEV1/FVC % % 87  87  88   Post FEV1/FCV % % 92  90  87   FEV1-Pre L 1.74  2.23  2.39   FEV1-Predicted Pre % 69  85  89   FEV1-Post L 1.85  2.34  2.40   DLCO uncorrected ml/min/mmHg 11.37  15.24  16.71   DLCO UNC% % 51  68  60   DLCO corrected ml/min/mmHg 12.63   16.07   DLCO COR %Predicted % 57   58   DLVA Predicted % 91  98  90   TLC L 3.65  3.89  4.37   TLC % Predicted % 57  61  69   RV % Predicted % 61  53  59     Latest Reference Range & Units 09/12/22 12:37  Monocyte-Macrophage-Serous Fluid 50 - 90 % 9 (L)  Color, Fluid YELLOW  COLORLESS !  Total Nucleated Cell Count, Fluid 0 - 1,000 cu mm 198  Fluid Type-FCT  Bronch [place]  Lymphs, Fluid % 1  Eos, Fluid % 0  Appearance, Fluid CLEAR  HAZY !  Neutrophil Count, Fluid 0 - 25 % 90 (H)  (L): Data is abnormally low !: Data is abnormal (H): Data is abnormally high    has a past medical history of Abnormal chest x-ray, Allergic rhinitis, Ankylosing spondylitis (HCC), Anxiety, Arthritis, Colonic polyp, Coronary atherosclerosis of native coronary artery, Cough, Dizziness (10/21/2016), Ganglion cyst of wrist, Heart attack ([place]) (2008), Hemorrhoids, History of malaria, Hyperlipidemia, Hypertension, Kidney stone, Other disorder of muscle, ligament, and fascia, Rotator cuff tear, and Swelling of lower extremity (10/16/2015).   reports that he has never smoked. He has never been exposed to tobacco smoke. He has never used smokeless tobacco.  Past Surgical History:  Procedure Laterality Date   bicep  rupture     BRONCHIAL BIOPSY  09/12/2022   Procedure: BRONCHIAL BIOPSIES;  Surgeon: Juanito Doom, MD;  Location[facility] ENDOSCOPY;  Service: Cardiopulmonary;;   BRONCHIAL WASHINGS  09/12/2022   Procedure: BRONCHIAL WASHINGS;  Surgeon: Juanito Doom, MD;  Location: [place] [facility] ENDOSCOPY;  Service: Cardiopulmonary;;   CORONARY ANGIOPLASTY WITH STENT PLACEMENT     HEMORRHOID SURGERY  05/2009   in [place]   LEFT HEART CATH AND CORONARY ANGIOGRAPHY N/A 01/14/2017   Procedure: Left Heart Cath and Coronary Angiography;  Surgeon: [name], MD;  Location: [facility] CV LAB;  Service: Cardiovascular;  Laterality: N/A;   LEFT HEART CATHETERIZATION WITH CORONARY ANGIOGRAM N/A 09/03/2012   Procedure: LEFT HEART CATHETERIZATION WITH CORONARY ANGIOGRAM;  Surgeon: [name], MD;  Location: [facility] CATH LAB;  Service: Cardiovascular;  Laterality: N/A;   PROSTATE BIOPSY     ROTATOR  CUFF REPAIR     bilat by Dr. French Ana   VIDEO BRONCHOSCOPY N/A 09/12/2022   Procedure: VIDEO BRONCHOSCOPY WITH FLUORO;  Surgeon: Juanito Doom, MD;  Location: [place];  Service: Cardiopulmonary;  Laterality: N/A;    Allergies  Allergen Reactions   Celecoxib Other (See Comments)    Pt states precipitated his heart attack.   Tramadol Other (See Comments)    Makes patient jumpy    Immunization History  Administered Date(s) Administered   Fluad Quad(high Dose 65+) 05/01/2019, 05/28/2021, 05/01/2022   Influenza Split 06/01/2012   Influenza Whole 05/30/2009, 04/20/2010   Influenza, High Dose Seasonal PF 05/19/2017, 05/19/2018   Influenza-Unspecified 05/13/2007, 07/12/2013, 05/10/2016, 06/23/2020   PFIZER(Purple Top)SARS-COV-2 Vaccination 09/18/2019, 10/09/2019, 07/13/2020   PNEUMOCOCCAL CONJUGATE-20 01/14/2022   Pneumococcal Conjugate-13 05/19/2018   Pneumococcal Polysaccharide-23 08/22/2009   Tdap 12/04/2010, 06/04/2011, 07/31/2018   Zoster Recombinat (Shingrix) 01/14/2022   Zoster, Live 05/17/2008   Zoster,  Unspecified 04/17/2022    Family History  Problem Relation Age of Onset   Cancer Mother        melanoma   Emphysema Mother    Rheum arthritis Mother    Diabetes Mother    Alcohol abuse Father      Current Outpatient Medications:    albuterol (VENTOLIN HFA) 108 (90 Base) MCG/ACT inhaler, Inhale 2 puffs into the lungs every 6 (six) hours as needed for wheezing or shortness of breath., Disp: 6.7 g, Rfl: 0   budesonide-formoterol (SYMBICORT) 80-4.5 MCG/ACT inhaler, Inhale 2 puffs into the lungs 2 (two) times daily., Disp: 1 each, Rfl: 3   doxazosin (CARDURA) 1 MG tablet, Take 1 mg by mouth daily., Disp: , Rfl:    ipratropium-albuterol (DUONEB) 0.5-2.5 (3) MG/3ML SOLN, Use twice a day scheduled and every 4 hours as needed for shortness of breath and wheezing, Disp: 360 mL, Rfl: 0   predniSONE (DELTASONE) 10 MG tablet, Take 4 tablets (40 mg total) by mouth daily with breakfast for 7 days, THEN 3 tablets (30 mg total) daily with breakfast for 7 days, THEN 2 tablets (20 mg total) daily with breakfast for 7 days. Then continue on 80m daily until further notice.., Disp: 100 tablet, Rfl: 0   acetaminophen (TYLENOL) 325 MG tablet, Take 650 mg by mouth every 6 (six) hours as needed for mild pain or moderate pain., Disp: , Rfl:    amLODipine (NORVASC) 2.5 MG tablet, Take 1 tablet (2.5 mg total) by mouth daily., Disp: 90 tablet, Rfl: 1   Apoaequorin (PREVAGEN PO), Take 1 tablet by mouth daily. , Disp: , Rfl:    aspirin 81 MG EC tablet, Take 81 mg by mouth daily., Disp: , Rfl:    azithromycin (ZITHROMAX) 500 MG tablet, Take 1 tablet (500 mg total) by mouth daily. (Patient not taking: Reported on 09/24/2022), Disp: 3 tablet, Rfl: 0   Cholecalciferol (VITAMIN D3) 1.25 MG (50000 UT) CAPS, Take 1 capsule by mouth daily. Pt does not know dose., Disp: , Rfl:    famotidine (PEPCID) 20 MG tablet, Take 20 mg by mouth daily., Disp: , Rfl:    furosemide (LASIX) 40 MG tablet, Take 1 tablet (40 mg total) by mouth  daily for 15 days., Disp: 15 tablet, Rfl: 0   hydroxyurea (HYDREA) 500 MG capsule, TAKE 1 CAPSULE BY MOUTH ONCE DAILY. MAY TAKE  WITH FOOD TO MINIMIZE GI SIDE EFFECTS, Disp: 30 capsule, Rfl: 0   Multiple Vitamins-Minerals (ICAPS AREDS 2) CAPS, Take 1 capsule by mouth 2 (two) times daily., Disp: ,  Rfl:    oxymetazoline (AFRIN) 0.05 % nasal spray, Place 1 spray into both nostrils daily., Disp: , Rfl:    pantoprazole (PROTONIX) 40 MG tablet, Take 1 tablet (40 mg total) by mouth 2 (two) times daily., Disp: 60 tablet, Rfl: 0   potassium chloride (KLOR-CON) 10 MEQ tablet, Take 1 tablet (10 mEq total) by mouth daily., Disp: 15 tablet, Rfl: 0   simvastatin (ZOCOR) 20 MG tablet, TAKE 1 TABLET EVERY DAY WITH BREAKFAST (APPOINTMENT IS NEEDED FOR REFILLS) (Patient taking differently: Take 20 mg by mouth daily with breakfast. TAKE 1 TABLET EVERY DAY WITH BREAKFAST (APPOINTMENT IS NEEDED FOR REFILLS)), Disp: 90 tablet, Rfl: 3   UNABLE TO FIND, Med Name: CPAP per Dr Halford Chessman, Disp: , Rfl:    UNABLE TO FIND, Take 6 capsules by mouth daily. Med Name: Balance of [employer] and Balance of [employer]. 3 capsules each by mouth once daily, Disp: , Rfl:       Objective:   Vitals:   09/24/22 0907  BP: (!) 100/50  Pulse: 92  Temp: 97.7 F (36.5 C)  SpO2: (!) 87%  Weight: 161 lb (73 kg)  Height: 5' 7"$  (1.702 m)    Estimated body mass index is 25.22 kg/m as calculated from the following:   Height as of this encounter: 5' 7"$  (1.702 m).   Weight as of this encounter: 161 lb (73 kg).  @WEIGHTCHANGE$ @  [employer]   09/24/22 0907  Weight: 161 lb (73 kg)     Physical Exam    General: No distress.  He is sitting in the wheelchair.  Looks deconditioned.  Has lost weight.  Oxygen on. Neuro: Alert and Oriented x 3. GCS 15. Speech normal Psych: Pleasant Resp:  Barrel Chest - no.  Wheeze - n, Crackles - yes, No overt respiratory distress CVS: Normal heart sounds. Murmurs - no Ext: Stigmata of Connective  Tissue Disease - no HEENT: Normal upper airway. PEERL +. No post nasal drip        Assessment:       ICD-10-CM   1. Chronic respiratory failure with hypoxia ([place])  J96.11 Ambulatory referral to Hospice    CANCELED: Ambulatory referral to Hospice    2. Hypersensitivity pneumonitis due to organic dust [facility])  J67.9 Ambulatory referral to Hospice    CANCELED: Ambulatory referral to Hospice    3. ILD (interstitial lung disease) ([place])  J84.9 Ambulatory referral to Hospice    CANCELED: Ambulatory referral to Hospice    4. Goals of care, counseling/discussion  Z71.89 Ambulatory referral to Hospice    CANCELED: Ambulatory referral to Hospice    5. Failure to thrive in adult  R62.7          Plan:     Patient Instructions     ICD-10-CM   1. Chronic respiratory failure with hypoxia ([place])  J96.11 Ambulatory referral to Hospice    2. Hypersensitivity pneumonitis due to organic dust [facility])  J67.9 Ambulatory referral to Hospice    3. ILD (interstitial lung disease) ([place])  J84.9 Ambulatory referral to Hospice    4. Goals of care, counseling/discussion  Z71.89 Ambulatory referral to Hospice    5. Failure to thrive in adult  R62.7      Progressive decline Severe disease I Worry that your prognosis is shortened to few to several months - pulse ox 87% on 5L [place]  Plan  - increase prednisone to 5m per day -> 2 weeks later go to 255mper day and  stay there - increase o2 to keep pulse ox > = 88% (might need 10L [place])  - order to [facility] to [facility] 09/24/2022 - refer home hospice - dc symbicort  - do nebulizer duoneb q6h scheduled with albuterol as needed  - stop OFEV - continue other medicines - DNR recommended  Followup  - 2 week video visit with Dr Chase Caller or APP         SIGNATURE    Dr. Brand Males, M.D., F.C.C.P,  Pulmonary and Critical Care Medicine Staff Physician, [place] Director - Interstitial Lung Disease  Program  [facility] [place], [place], [address]  Pager: [phone], If no answer or between  15:00h - 7:00h: call [phone] Telephone: [phone]  9:16 AM 09/24/2022    HIGh Complexity NEW OFFICE  The table below is from the 2021 E/M guidelines, first released in 2021, with minor revisions added in 2023. Must meet the requirements for 2 out of 3 dimensions to qualify.    Number and complexity of problems addressed Amount and/or complexity of data reviewed Risk of complications and/or morbidity  Severe exacerbation of chronic illness  Acute or chronic illnesses that may pose a threat to life or bodily function, e.g., multiple trauma, acute MI, pulmonary embolus, severe respiratory distress, progressive rheumatoid arthritis, psychiatric illness with potential threat to self or others, peritonitis, acute renal failure, abrupt change in neurological status Must meet the requirements for 2 of 3 of the categories)  Category 1: Tests and documents, historian  Any combination of 3 of the following:  Assessment requiring an independent historian  Review of prior external records  Review of results of each unique test  Ordering of each unique test    Category 2: Interpretation of tests  Independent interpretation of a test perfromed by another physician/NPP  Category 3: Discuss management/tests  Discussion of magagement or tests with an external physician/NPP Drug therapy requiring intensive monitoring for toxicity  Decision for elective major surgery with identified pateint or procedure risk factors  Decision regarding hospitalization or escalation of level of care  Decision for DNR or to de-escalate care   Parenteral controlled  substances

## 2022-09-25 ENCOUNTER — Telehealth: Payer: Self-pay | Admitting: Medical Oncology

## 2022-09-25 NOTE — Telephone Encounter (Signed)
I lvm that I returned his call and to call me back re his " change in medical status".  (Per Pulmonary note pt is considering hospice for his interstitial lung disease).  He is currently on hydrea.

## 2022-09-26 ENCOUNTER — Telehealth (INDEPENDENT_AMBULATORY_CARE_PROVIDER_SITE_OTHER): Payer: Medicare HMO | Admitting: Family Medicine

## 2022-09-26 DIAGNOSIS — J9601 Acute respiratory failure with hypoxia: Secondary | ICD-10-CM

## 2022-09-26 DIAGNOSIS — J849 Interstitial pulmonary disease, unspecified: Secondary | ICD-10-CM

## 2022-09-26 NOTE — Patient Instructions (Addendum)
If possible I would like to see updated labs since hospital - that can be drawn from nurse at home if they are able to do so. BMP and CBC are the two labs I would like to order.  Hospice should be able to provide for any needs that arise but I am happy to be of any help if needed. Please do not hesitate to reach out by phone or mychart. Honored to be able to be your primary care doc. Take care.   Dr. Carlota Raspberry

## 2022-09-26 NOTE — Progress Notes (Signed)
Virtual Visit via Video Note  I connected with [name] on 09/26/22 at 12:13 PM by a video enabled telemedicine application and verified that I am speaking with the correct person using two identifiers.  Patient location: home with spouse [name], consent given to discuss PHI.  My location: office - [place].    I discussed the limitations, risks, security and privacy concerns of performing an evaluation and management service by telephone and the availability of in person appointments. I also discussed with the patient that there may be a patient responsible charge related to this service. The patient expressed understanding and agreed to proceed, consent obtained  Chief complaint:  Chief Complaint  Patient presents with   Hospitalization Follow-up    Pt notes went due to lung disease, notes meeting with pulmonology this week and was advised to start hospice care and see PCP     History of Present Illness: [name] is a 82 y.o. male  Hospital follow-up. Admitted January 30 through February 3 for shortness of breath.  History of interstitial lung disease, chronic respiratory failure with hypoxia on oxygen and myeloproliferative disorder.  Admitted for progressive dyspnea. Transition of care phone note reviewed from February 5. I am seeing him via virtual visit today.  Acute on chronic hypoxic respiratory failure, interstitial lung disease, with community-acquired pneumonia. Followed by critical care.  Treated with empiric antibiotics to cover for community-acquired pneumonia, but did have significant history of weight gain, lower extremity edema, orthopnea, trial of IV Lasix with 4.5 L fluid removed with some improvement of orthopnea.  Oxygen requirement did improve from 6 L to 2 L, but still requiring 4 to 5 L oxygen with ambulation.  Discharged on low-dose oral diuretic and home prednisone taper.  Underwent bronchoscopy with pulmonary during hospitalization, treated  with nebulizer treatments. Outpatient follow-up with pulmonary.  With CBC, BMP planned outpatient to monitor magnesium, electrolyte levels and outpatient chest x-ray planned.   Continued on hydroxyurea with myeloproliferative disorder, chronic leukocytosis., Dr. Earlie Server- hematologist.  Office visit with pulmonary 2 days ago.  O2 sat 87% on 5 L nasal cannula.  Decline in pulmonary function.  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  Had reported improvement after being admitted but worse after discharge.  Thought to be having a flareup of interstitial lung disease, and steroids were not helping.  Unfortunately life expectancy was discussed in order of weeks to months.  Hospice referral was placed. Prednisone was increased to 40 mg/day, then after 2 weeks go back to 20 mg/day.  Oxygen increases to keep O2 sat greater than 88%.  Symbicort was discontinued.  DuoNeb nebulizer every 6 hours scheduled with albuterol as needed.  Stop OFEV, continue other meds.  2-week video visit planned.   Still having some rough times. On 10 liters oxygen currently. Has dropped to 50's standing or activity. 88-90's at rest.  No leg swelling.  Taking furosemide daily.  Home weights - about the same no recent increases or change from hospital.  Hospice nurse will be there today.  Hospice doctors will be assuming care.   Wt Readings from Last 3 Encounters:  09/24/22 161 lb (73 kg)  09/14/22 151 lb 6.4 oz (68.7 kg)  08/27/22 161 lb 9.6 oz (73.3 kg)    Lab Results  Component Value Date   WBC 25.0 (H) 09/14/2022   HGB 10.4 (L) 09/14/2022   HCT 34.5 (L) 09/14/2022   MCV 99.1 09/14/2022  PLT 249 09/14/2022   Lab Results  Component Value Date   NA 136 09/14/2022   K 3.9 09/14/2022   CL 94 (L) 09/14/2022   CO2 30 09/14/2022   Lab Results  Component Value Date   CREATININE 0.96 09/14/2022   Patient Active Problem List   Diagnosis Date Noted   Acute  on chronic respiratory failure with hypoxia ([place]) 09/10/2022   Sepsis due to pneumonia ([place]) 09/10/2022   Hypokalemia 09/10/2022   Normocytic anemia 09/10/2022   Acute respiratory failure with hypoxia ([place]) 07/20/2022   Myeloproliferative disorder ([place]) 07/20/2022   Leukocytosis 06/03/2022   Hypertrophy of prostate without urinary obstruction and other lower urinary tract symptoms (LUTS) 12/04/2021   Prostate cancer ([place]) 05/29/2021   GERD (gastroesophageal reflux disease) 12/15/2017   Unstable angina ([place]) 12/05/2017   Angina at rest 12/05/2017   Atypical chest pain    Dyslipidemia    COPD not affecting current episode of care    Essential hypertension 05/29/2017   Abnormal nuclear cardiac imaging test 01/14/2017   Bronchiectasis without complication ([place]) [id number]   ILD (interstitial lung disease) ([place]) 11/27/2016   Dizziness 10/21/2016   Cough variant asthma  vs UACS 07/22/2016   Obstructive sleep apnea 12/01/2015   Acute bronchitis 11/30/2015   Swelling of lower extremity 10/16/2015   Benign mole 02/04/2014   Impaired glucose tolerance 10/26/2013   NAFLD (nonalcoholic fatty liver disease) 04/27/2013   Thrombocytopenia ([place]) 10/21/2012   Exertional angina 09/03/2012   MALARIA 12/05/2009   COLONIC POLYPS 12/05/2009   HYPERCHOLESTEROLEMIA 12/05/2009   ANXIETY 12/05/2009   HEMORRHOIDS 12/05/2009   Allergic rhinitis 12/05/2009   GANGLION CYST, WRIST, LEFT 12/05/2009   ROTATOR CUFF TEAR 12/05/2009   OTHER DISORDER OF MUSCLE LIGAMENT AND FASCIA 12/05/2009   ABNORMAL CHEST XRAY 12/05/2009   CAD (coronary artery disease) 04/05/2009   Past Medical History:  Diagnosis Date   Abnormal chest x-ray    Allergic rhinitis    Ankylosing spondylitis (HCC)    Anxiety    Arthritis    Colonic polyp    Coronary atherosclerosis of native coronary artery    Cough    Dizziness 10/21/2016   Ganglion cyst of wrist    left   Heart attack ([place]) 2008   Hemorrhoids    History of malaria     Hyperlipidemia    with low HDL   Hypertension    Kidney stone    Other disorder of muscle, ligament, and fascia    Rotator cuff tear    Swelling of lower extremity 10/16/2015   Past Surgical History:  Procedure Laterality Date   bicep rupture     BRONCHIAL BIOPSY  09/12/2022   Procedure: BRONCHIAL BIOPSIES;  Surgeon: Juanito Doom, MD;  Location: [facility];  Service: Cardiopulmonary;;   BRONCHIAL WASHINGS  09/12/2022   Procedure: BRONCHIAL WASHINGS;  Surgeon: Juanito Doom, MD;  Location: [facility];  Service: Cardiopulmonary;;   CORONARY ANGIOPLASTY WITH STENT PLACEMENT     HEMORRHOID SURGERY  05/2009   in [place]   LEFT HEART CATH AND CORONARY ANGIOGRAPHY N/A 01/14/2017   Procedure: Left Heart Cath and Coronary Angiography;  Surgeon: [name], MD;  Location: [facility];  Service: Cardiovascular;  Laterality: N/A;   LEFT HEART CATHETERIZATION WITH CORONARY ANGIOGRAM N/A 09/03/2012   Procedure: LEFT HEART CATHETERIZATION WITH CORONARY ANGIOGRAM;  Surgeon: [name], MD;  Location: [facility] CATH LAB;  Service: Cardiovascular;  Laterality: N/A;   PROSTATE BIOPSY     ROTATOR  CUFF REPAIR     bilat by Dr. French Ana   VIDEO BRONCHOSCOPY N/A 09/12/2022   Procedure: VIDEO BRONCHOSCOPY WITH FLUORO;  Surgeon: Juanito Doom, MD;  Location: [facility];  Service: Cardiopulmonary;  Laterality: N/A;   Allergies  Allergen Reactions   Celecoxib Other (See Comments)    Pt states precipitated his heart attack.   Tramadol Other (See Comments)    Makes patient jumpy   Prior to Admission medications   Medication Sig Start Date End Date Taking? Authorizing Provider  acetaminophen (TYLENOL) 325 MG tablet Take 650 mg by mouth every 6 (six) hours as needed for mild pain or moderate pain.   Yes [provider]  albuterol (VENTOLIN HFA) 108 (90 Base) MCG/ACT inhaler Inhale 2 puffs into the lungs every 6 (six) hours as needed for wheezing or shortness of breath. 09/14/22   Yes Thurnell Lose, MD  amLODipine (NORVASC) 2.5 MG tablet Take 1 tablet (2.5 mg total) by mouth daily. 08/23/22  Yes [name], MD  Apoaequorin (PREVAGEN PO) Take 1 tablet by mouth daily.    Yes [provider]  aspirin 81 MG EC tablet Take 81 mg by mouth daily.   Yes [provider]  budesonide-formoterol (SYMBICORT) 80-4.5 MCG/ACT inhaler Inhale 2 puffs into the lungs 2 (two) times daily. 03/11/22  Yes [name], MD  Cholecalciferol (VITAMIN D3) 1.25 MG (50000 UT) CAPS Take 1 capsule by mouth daily. Pt does not know dose.   Yes [provider]  doxazosin (CARDURA) 1 MG tablet Take 1 mg by mouth daily.   Yes [provider]  famotidine (PEPCID) 20 MG tablet Take 20 mg by mouth daily.   Yes [provider]  furosemide (LASIX) 40 MG tablet Take 1 tablet (40 mg total) by mouth daily for 15 days. 09/14/22 09/29/22 Yes Thurnell Lose, MD  hydroxyurea (HYDREA) 500 MG capsule TAKE 1 CAPSULE BY MOUTH ONCE DAILY. MAY TAKE  WITH FOOD TO MINIMIZE GI SIDE EFFECTS 09/21/22  Yes Curt Bears, MD  ipratropium-albuterol (DUONEB) 0.5-2.5 (3) MG/3ML SOLN Use twice a day scheduled and every 4 hours as needed for shortness of breath and wheezing 09/14/22  Yes Thurnell Lose, MD  Multiple Vitamins-Minerals (ICAPS AREDS 2) CAPS Take 1 capsule by mouth 2 (two) times daily.   Yes [provider]  oxymetazoline (AFRIN) 0.05 % nasal spray Place 1 spray into both nostrils daily.   Yes [provider]  potassium chloride (KLOR-CON) 10 MEQ tablet Take 1 tablet (10 mEq total) by mouth daily. 09/14/22  Yes Thurnell Lose, MD  predniSONE (DELTASONE) 10 MG tablet Take 4 tablets (40 mg total) by mouth daily with breakfast for 7 days, THEN 3 tablets (30 mg total) daily with breakfast for 7 days, THEN 2 tablets (20 mg total) daily with breakfast for 7 days. Then continue on 66m daily until further notice.. 09/06/22 22024/02/29Yes RBrand Males MD   predniSONE (DELTASONE) 10 MG tablet Take 4 tablets (40 mg total) by mouth daily with breakfast. 09/24/22  Yes RBrand Males MD  simvastatin (ZOCOR) 20 MG tablet TAKE 1 TABLET EVERY DAY WITH BREAKFAST (APPOINTMENT IS NEEDED FOR REFILLS) Patient taking differently: Take 20 mg by mouth daily with breakfast. TAKE 1 TABLET EVERY DAY WITH BREAKFAST (APPOINTMENT IS NEEDED FOR REFILLS) 12/19/21  Yes CSherren Mocha MD  UNABLE TO FIND Med Name: CPAP per Dr SHalford Chessman  Yes [provider]  UNABLE TO FIND Take 6 capsules by mouth daily.  Med Name: Balance of [employer] and Balance of [employer]. 3 capsules each by mouth once daily   Yes [provider]  azithromycin (ZITHROMAX) 500 MG tablet Take 1 tablet (500 mg total) by mouth daily. Patient not taking: Reported on 09/24/2022 09/14/22   Thurnell Lose, MD  pantoprazole (PROTONIX) 40 MG tablet Take 1 tablet (40 mg total) by mouth 2 (two) times daily. 07/23/22 09/10/22  Shelly Coss, MD   Social History   Socioeconomic History   Marital status: Married    Spouse name: [name]   Number of children: 2   Years of education: Not on file   Highest education level: Not on file  Occupational History   Not on file  Tobacco Use   Smoking status: Never    Passive exposure: Never   Smokeless tobacco: Never  Vaping Use   Vaping Use: Never used  Substance and Sexual Activity   Alcohol use: Yes    Comment: holiday Rare    Drug use: No   Sexual activity: Not Currently  Other Topics Concern   Not on file  Social History Narrative   Not on file   Social Determinants of Health   Financial Resource Strain: Low Risk  (04/03/2022)   Overall Financial Resource Strain (CARDIA)    Difficulty of Paying Living Expenses: Not hard at all  Food Insecurity: No Food Insecurity (09/16/2022)   Hunger Vital Sign    Worried About Running Out of Food in the Last Year: Never true    Ran Out of Food in the Last Year: Never true  Transportation  Needs: No Transportation Needs (09/16/2022)   PRAPARE - Hydrologist (Medical): No    Lack of Transportation (Non-Medical): No  Physical Activity: Inactive (04/03/2022)   Exercise Vital Sign    Days of Exercise per Week: 0 days    Minutes of Exercise per Session: 0 min  Stress: No Stress Concern Present (04/03/2022)   [place]    Feeling of Stress : Not at all  Social Connections: Moderately Integrated (04/03/2022)   Social Connection and Isolation Panel [NHANES]    Frequency of Communication with Friends and Family: Three times a week    Frequency of Social Gatherings with Friends and Family: Three times a week    Attends Religious Services: Never    Active Member of Clubs or Organizations: Yes    Attends Archivist Meetings: More than 4 times per year    Marital Status: Married  Human resources officer Violence: Not At Risk (09/11/2022)   Humiliation, Afraid, Rape, and Kick questionnaire    Fear of Current or Ex-Partner: No    Emotionally Abused: No    Physically Abused: No    Sexually Abused: No    Observations/Objective: There were no vitals filed for this visit. Speaking in short sentences but appropriate responses, coherent responses, nontoxic appearance on video.  Assessment and Plan: Acute respiratory failure with hypoxia (HCC)  ILD (interstitial lung disease) ([place]) Status post hospitalization as above, some initial improvement and some worsening but has been in discussion with his pulmonologist  Progressive interstitial lung disease, minimal improvement with prednisone at this time.  On 10 L oxygen as above, and now under care of hospice.  Recommended repeat CBC, BMP since hospitalization.  This may be difficult given his current status, option to possibly have hospice checked his labs. he will have the hospice physician/team manage medications  moving forward.  He thanked me for my  care, but let him know that it was my honor to be able to be able to serve as his primary care provider, and I am still happy to assist in any way I can.  No acute needs at this time.  Follow Up Instructions:.  Patient Instructions  If possible I would like to see updated labs since hospital - that can be drawn from nurse at home if they are able to do so. BMP and CBC are the two labs I would like to order.  Hospice should be able to provide for any needs that arise but I am happy to be of any help if needed. Please do not hesitate to reach out by phone or mychart. Honored to be able to be your primary care doc. Take care.   Dr. Carlota Raspberry      I discussed the assessment and treatment plan with the patient. The patient was provided an opportunity to ask questions and all were answered. The patient agreed with the plan and demonstrated an understanding of the instructions.   The patient was advised to call back or seek an in-person evaluation if the symptoms worsen or if the condition fails to improve as anticipated.   [name], MD

## 2022-09-26 NOTE — Telephone Encounter (Addendum)
Pt stated his lung disease is rapidly progressing. He is on oxygen 24.7 and sometimes up to 10 liters. He is referred to Hospice. He stopped his hydrea and he cancelled his future appts at the cancer center . He expressed his appreciation to Dr. [name].

## 2022-09-27 ENCOUNTER — Encounter (HOSPITAL_BASED_OUTPATIENT_CLINIC_OR_DEPARTMENT_OTHER): Payer: Medicare HMO | Admitting: Pulmonary Disease

## 2022-09-29 ENCOUNTER — Encounter: Payer: Self-pay | Admitting: Family Medicine

## 2022-10-01 ENCOUNTER — Ambulatory Visit: Payer: Medicare HMO | Admitting: Rheumatology

## 2022-10-01 DIAGNOSIS — J849 Interstitial pulmonary disease, unspecified: Secondary | ICD-10-CM

## 2022-10-01 DIAGNOSIS — M546 Pain in thoracic spine: Secondary | ICD-10-CM

## 2022-10-01 DIAGNOSIS — M545 Low back pain, unspecified: Secondary | ICD-10-CM

## 2022-10-01 DIAGNOSIS — J479 Bronchiectasis, uncomplicated: Secondary | ICD-10-CM

## 2022-10-01 DIAGNOSIS — G4733 Obstructive sleep apnea (adult) (pediatric): Secondary | ICD-10-CM

## 2022-10-01 DIAGNOSIS — U071 COVID-19: Secondary | ICD-10-CM

## 2022-10-01 DIAGNOSIS — Z86010 Personal history of colonic polyps: Secondary | ICD-10-CM

## 2022-10-01 DIAGNOSIS — G8929 Other chronic pain: Secondary | ICD-10-CM

## 2022-10-01 DIAGNOSIS — M5030 Other cervical disc degeneration, unspecified cervical region: Secondary | ICD-10-CM

## 2022-10-01 DIAGNOSIS — D696 Thrombocytopenia, unspecified: Secondary | ICD-10-CM

## 2022-10-01 DIAGNOSIS — Z8659 Personal history of other mental and behavioral disorders: Secondary | ICD-10-CM

## 2022-10-01 DIAGNOSIS — K219 Gastro-esophageal reflux disease without esophagitis: Secondary | ICD-10-CM

## 2022-10-01 DIAGNOSIS — K760 Fatty (change of) liver, not elsewhere classified: Secondary | ICD-10-CM

## 2022-10-01 DIAGNOSIS — E785 Hyperlipidemia, unspecified: Secondary | ICD-10-CM

## 2022-10-01 DIAGNOSIS — I25119 Atherosclerotic heart disease of native coronary artery with unspecified angina pectoris: Secondary | ICD-10-CM

## 2022-10-01 DIAGNOSIS — I10 Essential (primary) hypertension: Secondary | ICD-10-CM

## 2022-10-01 DIAGNOSIS — M19042 Primary osteoarthritis, left hand: Secondary | ICD-10-CM

## 2022-10-04 ENCOUNTER — Telehealth: Payer: Self-pay | Admitting: Family Medicine

## 2022-10-04 ENCOUNTER — Telehealth: Payer: Self-pay | Admitting: Internal Medicine

## 2022-10-04 NOTE — Telephone Encounter (Signed)
Received notification that [name] has passed. Called spouse [name] to express my condolences. No answer - I will try to reach her at another time.

## 2022-10-04 NOTE — Telephone Encounter (Signed)
Called wife [name] -> he died within 3 days of going to hospice. Wife felt death was comfortable. Wife appreciated our care.      SIGNATURE    Dr. Brand Males, M.D., F.C.C.P,  Pulmonary and Critical Care Medicine Staff Physician, [place] Director - Interstitial Lung Disease  Program  Medical Director - [facility] [place] [facility] [place], [place], [address]   Pager: [phone], If no answer  -[place] or [place] [phone] Telephone (clinical office): [phone] Telephone (research): [phone]  4:46 PM 10/04/2022

## 2022-10-05 NOTE — Telephone Encounter (Signed)
Attempted call again. Left message that I will reach out again next week.

## 2022-10-07 NOTE — Telephone Encounter (Signed)
Called [name], expressed condolences on the passing of Herminio and again let her know I was honored to be his primary care provider.  She is doing okay in the grand scheme of things, denies any acute needs at this time and has support.  Today was their anniversary.   Again doing okay at this time and denies any needs.  Advised to let us know if there is any assistance we can provide.  Thanked me for my call.

## 2022-10-08 ENCOUNTER — Other Ambulatory Visit: Payer: Medicare HMO

## 2022-10-08 ENCOUNTER — Telehealth: Payer: Medicare HMO | Admitting: Adult Health

## 2022-10-08 ENCOUNTER — Ambulatory Visit: Payer: Medicare HMO | Admitting: Internal Medicine

## 2022-10-11 DEATH — deceased

## 2022-10-15 LAB — FUNGUS CULTURE WITH STAIN

## 2022-10-15 LAB — FUNGUS CULTURE RESULT

## 2022-10-15 LAB — FUNGAL ORGANISM REFLEX

## 2022-10-29 LAB — ACID FAST CULTURE WITH REFLEXED SENSITIVITIES (MYCOBACTERIA): Acid Fast Culture: NEGATIVE

## 2023-02-21 ENCOUNTER — Ambulatory Visit: Payer: Medicare HMO | Admitting: Family Medicine
